# Patient Record
Sex: Female | Born: 1955 | Race: White | Hispanic: No | Smoking: Former smoker
Health system: Southern US, Community
[De-identification: ages and names within clinical notes are randomized; demographics above are authoritative.]

## PROBLEM LIST (undated history)

## (undated) DIAGNOSIS — R569 Unspecified convulsions: Secondary | ICD-10-CM

## (undated) DIAGNOSIS — L0291 Cutaneous abscess, unspecified: Secondary | ICD-10-CM

## (undated) DIAGNOSIS — IMO0002 Reserved for concepts with insufficient information to code with codable children: Secondary | ICD-10-CM

## (undated) DIAGNOSIS — Z8719 Personal history of other diseases of the digestive system: Secondary | ICD-10-CM

## (undated) DIAGNOSIS — N6320 Unspecified lump in the left breast, unspecified quadrant: Secondary | ICD-10-CM

## (undated) DIAGNOSIS — I10 Essential (primary) hypertension: Secondary | ICD-10-CM

## (undated) DIAGNOSIS — J449 Chronic obstructive pulmonary disease, unspecified: Secondary | ICD-10-CM

## (undated) DIAGNOSIS — G039 Meningitis, unspecified: Secondary | ICD-10-CM

## (undated) DIAGNOSIS — F99 Mental disorder, not otherwise specified: Secondary | ICD-10-CM

## (undated) DIAGNOSIS — T1491XA Suicide attempt, initial encounter: Secondary | ICD-10-CM

## (undated) DIAGNOSIS — Z9889 Other specified postprocedural states: Secondary | ICD-10-CM

## (undated) DIAGNOSIS — F329 Major depressive disorder, single episode, unspecified: Secondary | ICD-10-CM

## (undated) DIAGNOSIS — F1011 Alcohol abuse, in remission: Secondary | ICD-10-CM

## (undated) DIAGNOSIS — F1411 Cocaine abuse, in remission: Secondary | ICD-10-CM

## (undated) DIAGNOSIS — R092 Respiratory arrest: Secondary | ICD-10-CM

## (undated) DIAGNOSIS — G40909 Epilepsy, unspecified, not intractable, without status epilepticus: Secondary | ICD-10-CM

## (undated) DIAGNOSIS — Z8639 Personal history of other endocrine, nutritional and metabolic disease: Secondary | ICD-10-CM

## (undated) DIAGNOSIS — Q2730 Arteriovenous malformation, site unspecified: Secondary | ICD-10-CM

## (undated) DIAGNOSIS — C3411 Malignant neoplasm of upper lobe, right bronchus or lung: Principal | ICD-10-CM

## (undated) HISTORY — DX: Arteriovenous malformation, site unspecified: Q27.30

## (undated) HISTORY — PX: BREAST SURGERY: SHX581

## (undated) HISTORY — PX: CRANIOTOMY: SHX93

## (undated) HISTORY — DX: Respiratory arrest: R09.2

## (undated) HISTORY — DX: Cocaine abuse, in remission: F14.11

## (undated) HISTORY — DX: Chronic obstructive pulmonary disease, unspecified: J44.9

## (undated) HISTORY — DX: Cutaneous abscess, unspecified: L02.91

## (undated) HISTORY — DX: Suicide attempt, initial encounter: T14.91XA

## (undated) HISTORY — DX: Alcohol abuse, in remission: F10.11

## (undated) HISTORY — DX: Mental disorder, not otherwise specified: F99

## (undated) HISTORY — DX: Personal history of other diseases of the digestive system: Z87.19

## (undated) HISTORY — PX: BRAIN SURGERY: SHX531

## (undated) HISTORY — DX: Personal history of other endocrine, nutritional and metabolic disease: Z86.39

## (undated) HISTORY — PX: OTHER SURGICAL HISTORY: SHX169

## (undated) HISTORY — PX: BACK SURGERY: SHX140

## (undated) HISTORY — DX: Major depressive disorder, single episode, unspecified: F32.9

---

## 1980-04-29 HISTORY — PX: BREAST EXCISIONAL BIOPSY: SUR124

## 1997-08-07 ENCOUNTER — Emergency Department (HOSPITAL_COMMUNITY): Admission: EM | Admit: 1997-08-07 | Discharge: 1997-08-07 | Payer: Self-pay | Admitting: Emergency Medicine

## 1998-10-28 ENCOUNTER — Emergency Department (HOSPITAL_COMMUNITY): Admission: EM | Admit: 1998-10-28 | Discharge: 1998-10-28 | Payer: Self-pay | Admitting: *Deleted

## 1999-12-25 ENCOUNTER — Emergency Department (HOSPITAL_COMMUNITY): Admission: EM | Admit: 1999-12-25 | Discharge: 1999-12-25 | Payer: Self-pay | Admitting: Emergency Medicine

## 2000-01-08 ENCOUNTER — Encounter: Payer: Self-pay | Admitting: Surgery

## 2000-01-10 ENCOUNTER — Ambulatory Visit (HOSPITAL_COMMUNITY): Admission: RE | Admit: 2000-01-10 | Discharge: 2000-01-10 | Payer: Self-pay | Admitting: Surgery

## 2002-03-08 ENCOUNTER — Emergency Department (HOSPITAL_COMMUNITY): Admission: EM | Admit: 2002-03-08 | Discharge: 2002-03-09 | Payer: Self-pay | Admitting: *Deleted

## 2002-05-27 ENCOUNTER — Encounter: Payer: Self-pay | Admitting: Internal Medicine

## 2002-05-27 ENCOUNTER — Ambulatory Visit (HOSPITAL_COMMUNITY): Admission: RE | Admit: 2002-05-27 | Discharge: 2002-05-27 | Payer: Self-pay | Admitting: Internal Medicine

## 2002-07-31 ENCOUNTER — Emergency Department (HOSPITAL_COMMUNITY): Admission: EM | Admit: 2002-07-31 | Discharge: 2002-07-31 | Payer: Self-pay | Admitting: Emergency Medicine

## 2002-07-31 ENCOUNTER — Encounter: Payer: Self-pay | Admitting: *Deleted

## 2002-09-04 ENCOUNTER — Emergency Department (HOSPITAL_COMMUNITY): Admission: EM | Admit: 2002-09-04 | Discharge: 2002-09-04 | Payer: Self-pay | Admitting: Emergency Medicine

## 2002-09-05 ENCOUNTER — Emergency Department (HOSPITAL_COMMUNITY): Admission: EM | Admit: 2002-09-05 | Discharge: 2002-09-05 | Payer: Self-pay | Admitting: Emergency Medicine
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
## 2003-07-31 ENCOUNTER — Inpatient Hospital Stay (HOSPITAL_COMMUNITY): Admission: AD | Admit: 2003-07-31 | Discharge: 2003-08-02 | Payer: Self-pay | Admitting: Emergency Medicine

## 2004-06-04 ENCOUNTER — Ambulatory Visit: Payer: Self-pay | Admitting: Family Medicine

## 2004-06-14 ENCOUNTER — Ambulatory Visit: Payer: Self-pay | Admitting: Family Medicine

## 2004-09-03 ENCOUNTER — Ambulatory Visit: Payer: Self-pay | Admitting: Family Medicine

## 2004-12-18 ENCOUNTER — Inpatient Hospital Stay (HOSPITAL_COMMUNITY): Admission: EM | Admit: 2004-12-18 | Discharge: 2004-12-20 | Payer: Self-pay | Admitting: Internal Medicine

## 2004-12-21 ENCOUNTER — Emergency Department (HOSPITAL_COMMUNITY): Admission: EM | Admit: 2004-12-21 | Discharge: 2004-12-21 | Payer: Self-pay | Admitting: Emergency Medicine

## 2005-01-08 ENCOUNTER — Emergency Department (HOSPITAL_COMMUNITY): Admission: EM | Admit: 2005-01-08 | Discharge: 2005-01-08 | Payer: Self-pay | Admitting: Emergency Medicine

## 2005-05-07 ENCOUNTER — Inpatient Hospital Stay (HOSPITAL_COMMUNITY): Admission: EM | Admit: 2005-05-07 | Discharge: 2005-05-09 | Payer: Self-pay | Admitting: Emergency Medicine

## 2005-06-11 ENCOUNTER — Ambulatory Visit: Payer: Self-pay | Admitting: *Deleted

## 2005-06-11 ENCOUNTER — Ambulatory Visit: Payer: Self-pay | Admitting: Family Medicine

## 2005-07-02 ENCOUNTER — Ambulatory Visit: Payer: Self-pay | Admitting: Family Medicine

## 2005-07-26 ENCOUNTER — Emergency Department (HOSPITAL_COMMUNITY): Admission: EM | Admit: 2005-07-26 | Discharge: 2005-07-26 | Payer: Self-pay | Admitting: Emergency Medicine

## 2005-07-28 ENCOUNTER — Encounter (INDEPENDENT_AMBULATORY_CARE_PROVIDER_SITE_OTHER): Payer: Self-pay | Admitting: *Deleted

## 2005-07-28 ENCOUNTER — Inpatient Hospital Stay (HOSPITAL_COMMUNITY): Admission: EM | Admit: 2005-07-28 | Discharge: 2005-12-04 | Payer: Self-pay | Admitting: Pediatrics

## 2005-07-28 ENCOUNTER — Ambulatory Visit: Payer: Self-pay | Admitting: Infectious Diseases

## 2005-07-28 ENCOUNTER — Ambulatory Visit: Payer: Self-pay | Admitting: Pulmonary Disease

## 2005-07-28 DIAGNOSIS — L0291 Cutaneous abscess, unspecified: Secondary | ICD-10-CM

## 2005-07-28 HISTORY — DX: Cutaneous abscess, unspecified: L02.91

## 2005-09-19 ENCOUNTER — Ambulatory Visit: Payer: Self-pay | Admitting: Internal Medicine
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
## 2005-11-22 ENCOUNTER — Ambulatory Visit: Payer: Self-pay | Admitting: Physical Medicine & Rehabilitation

## 2006-01-06 ENCOUNTER — Encounter: Admission: RE | Admit: 2006-01-06 | Discharge: 2006-04-06 | Payer: Self-pay | Admitting: Neurosurgery

## 2006-01-31 IMAGING — CR DG CHEST 1V PORT
1 series · 1 of 1 positions shown · non-contrast
Comparison: none

CLINICAL DATA: Seizure, altered level of consciousness. 
 PORTABLE CHEST ? 05/07/05 ([DATE] HOURS):
 No comparison. 
 The lungs are hyperinflated with changes of COPD and the lungs are clear.   There are old rib fractures bilaterally.

[view not recorded]
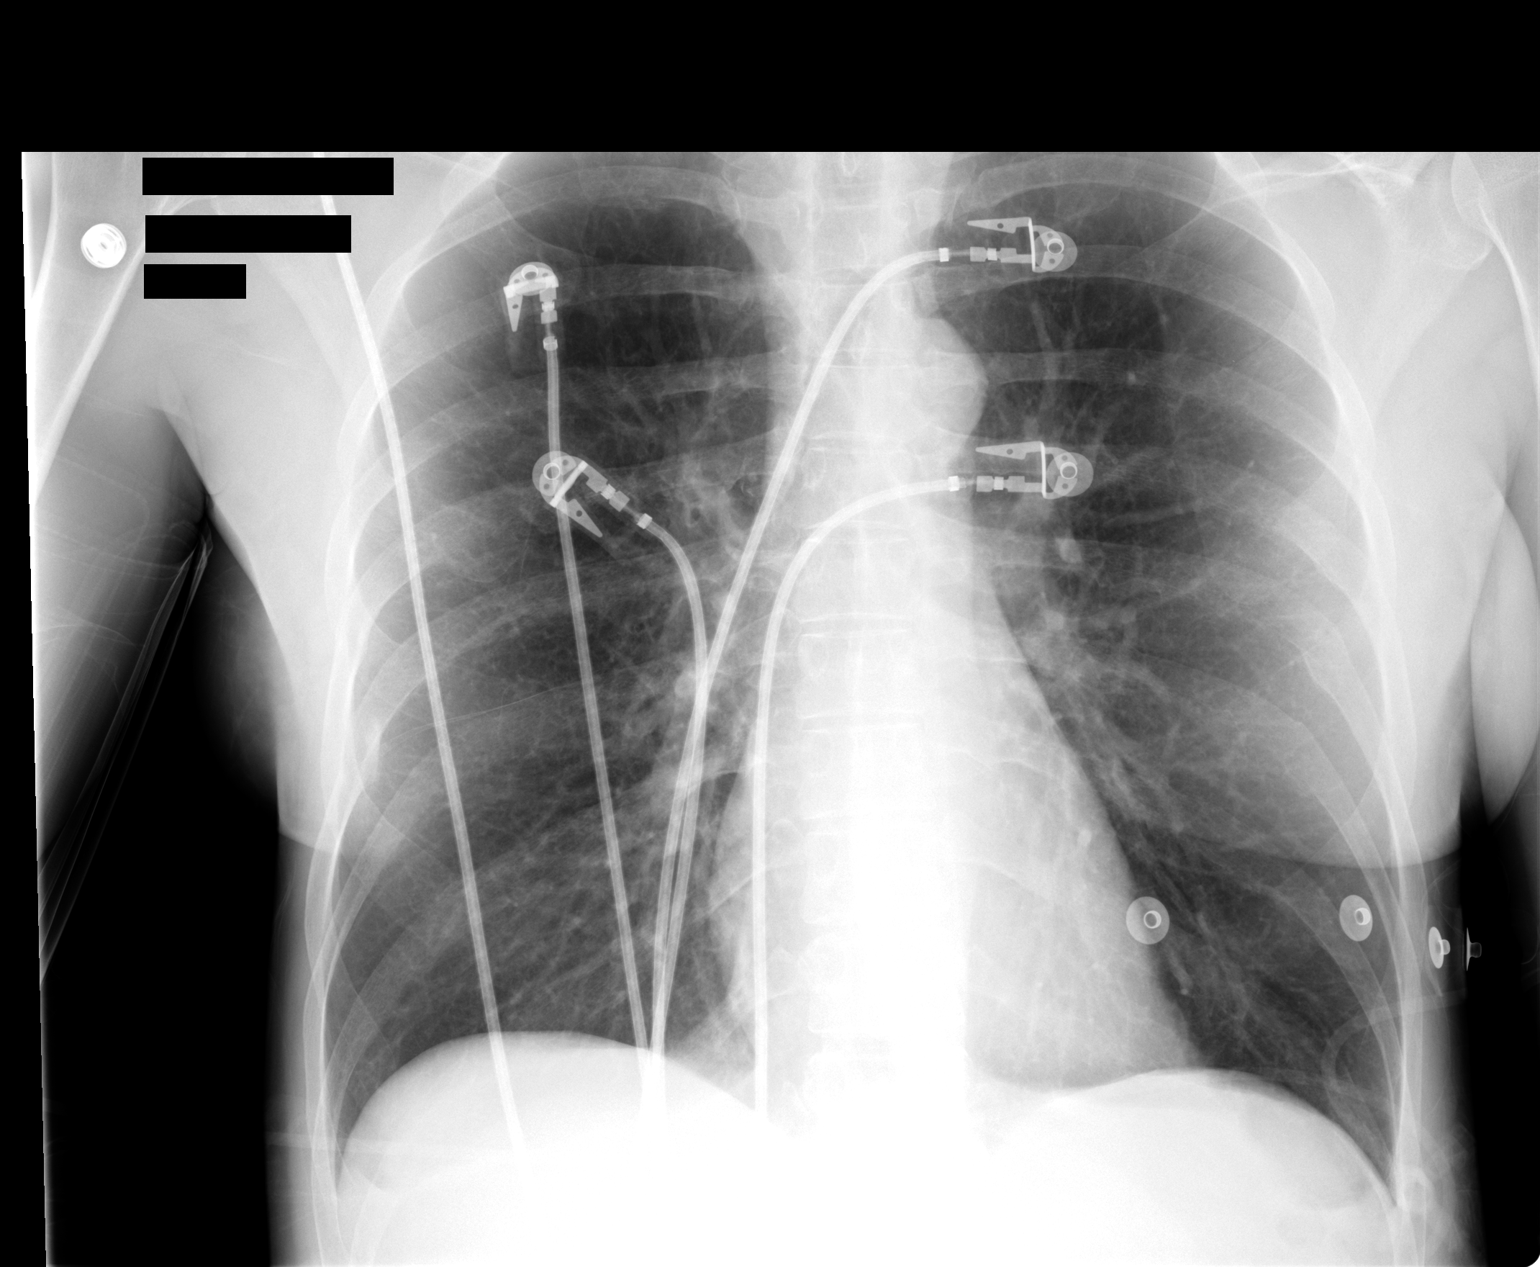

[1 of 1 positions shown; findings below may reference images not displayed]

IMPRESSION: COPD. No active disease.

## 2006-01-31 IMAGING — CT CT HEAD W/O CM
1 series · 15 of 30 positions shown, 19 images · IV contrast (agent unspecified)
Comparison: CT 07/31/02.

CLINICAL DATA: Seizure, history of aneurysm. 
HEAD CT WITHOUT CONTRAST:
TECHNIQUE: Contiguous axial images were obtained from the base of the skull through the vertex according to standard protocol without contrast.

[Series 2: brain · axial · 0.47mm/px · z∈[+138,+269]mm · 15 of 42 slices shown, 19 images]
[im 2/42  brain]
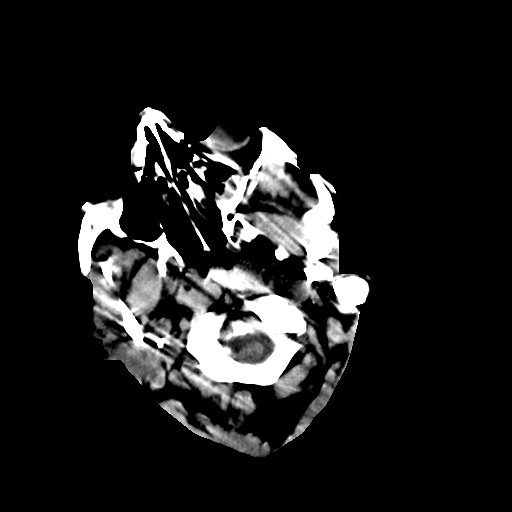
[im 2/42  bone]
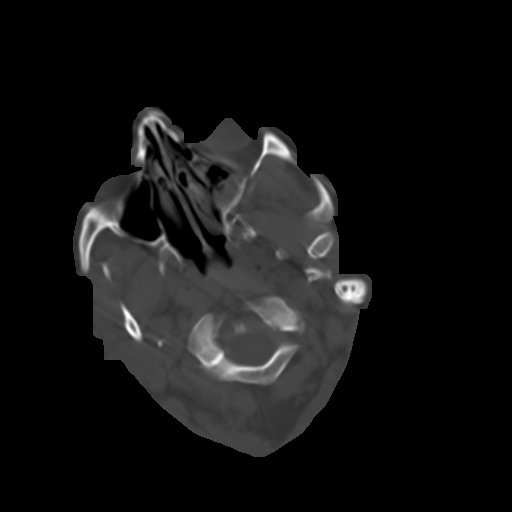
[im 5/42  brain]
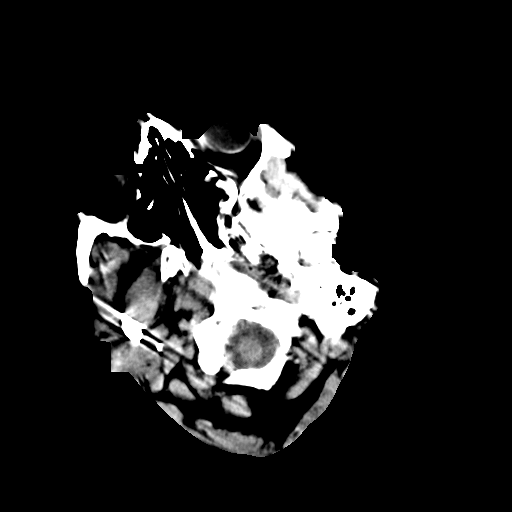
[im 8/42  brain]
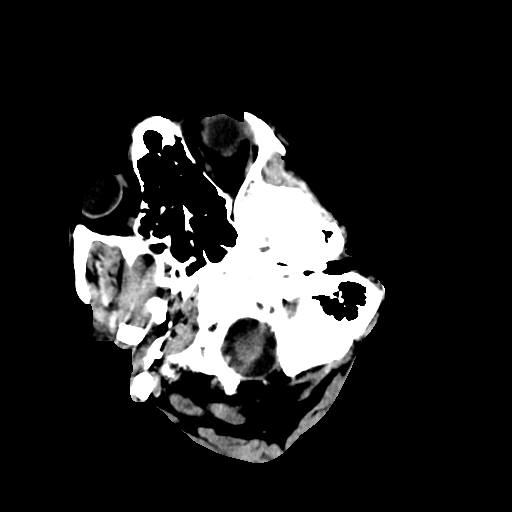
[im 10/42  brain]
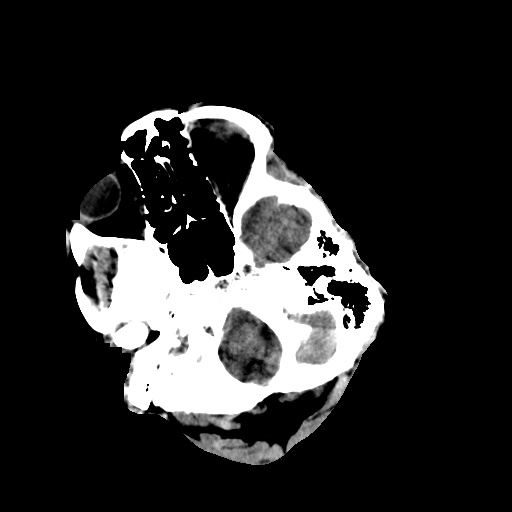
[im 13/42  brain]
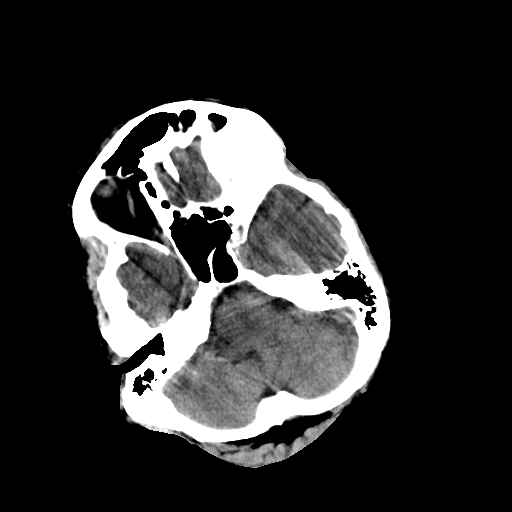
[im 13/42  bone]
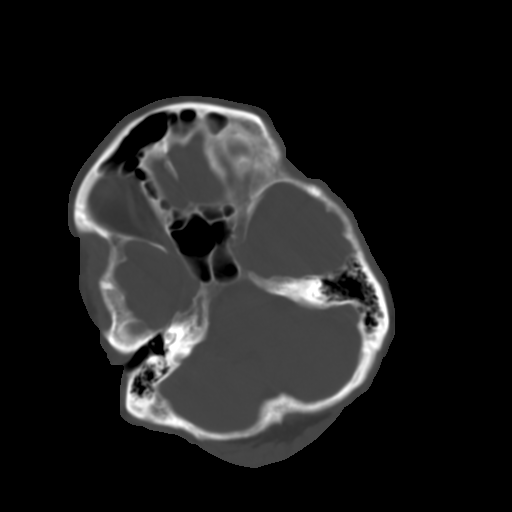
[im 16/42  brain]
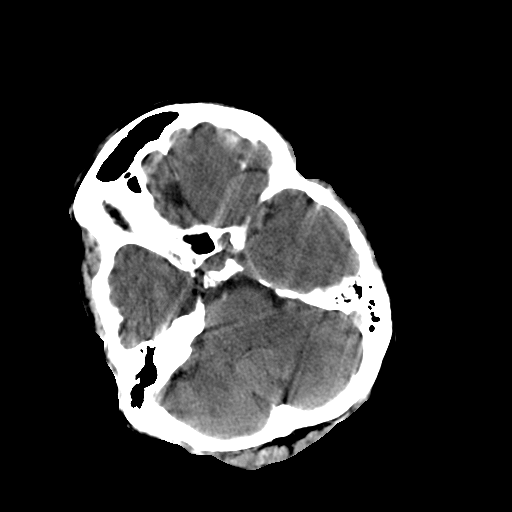
[im 19/42  brain]
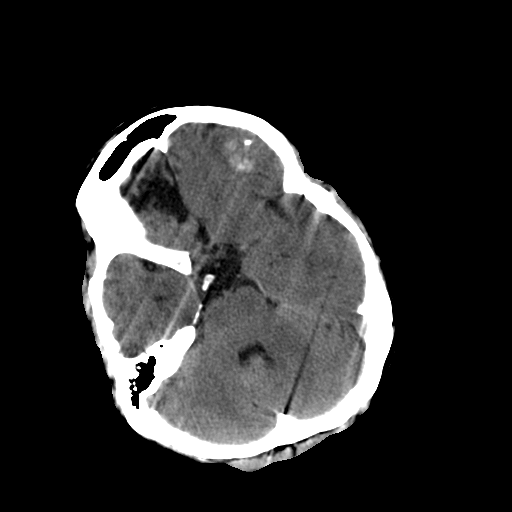
[im 22/42  brain]
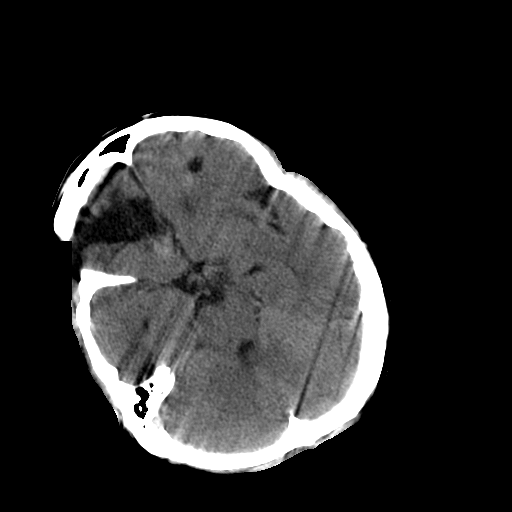
[im 23/42  brain]
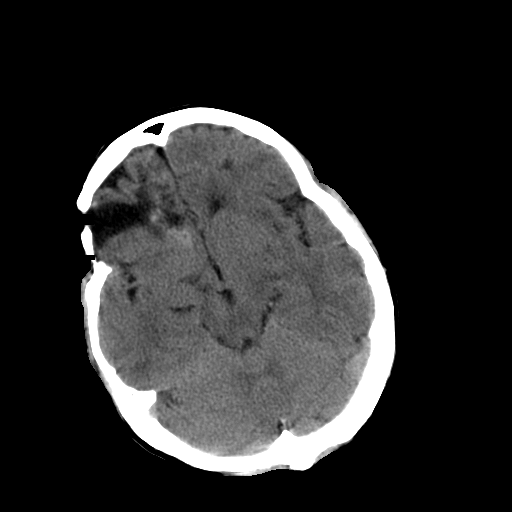
[im 23/42  bone]
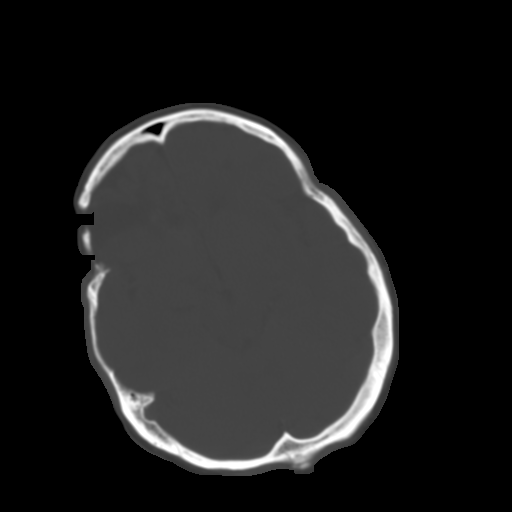
[im 26/42  brain]
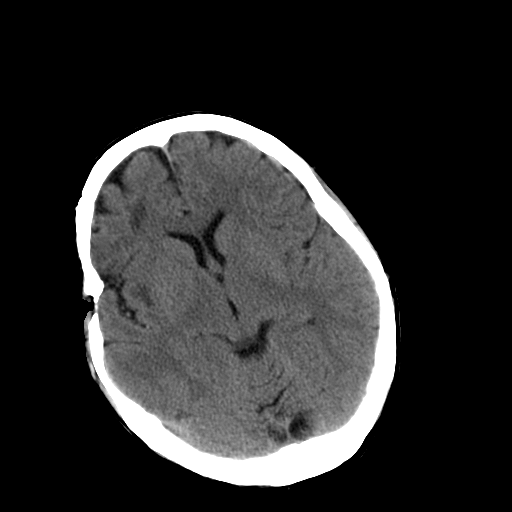
[im 29/42  brain]
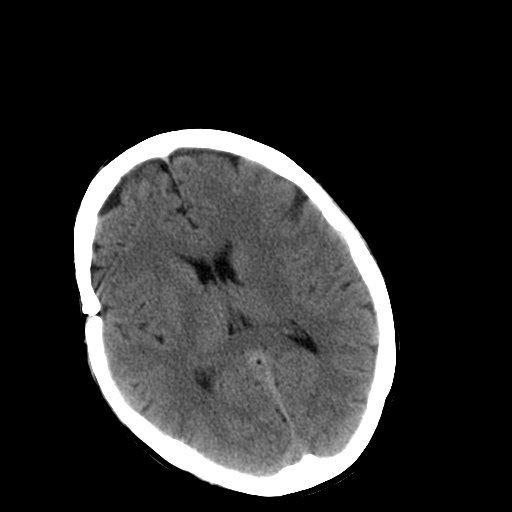
[im 32/42  brain]
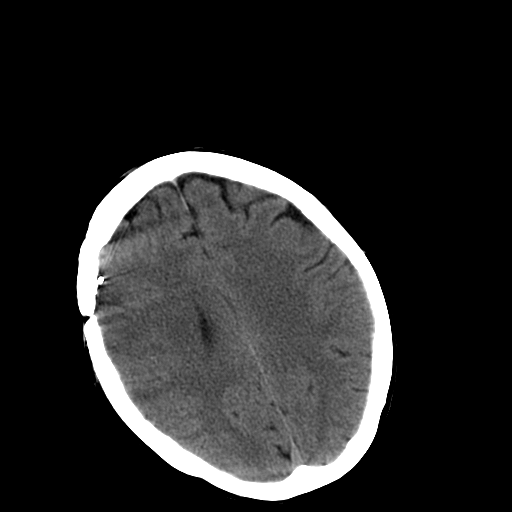
[im 34/42  brain]
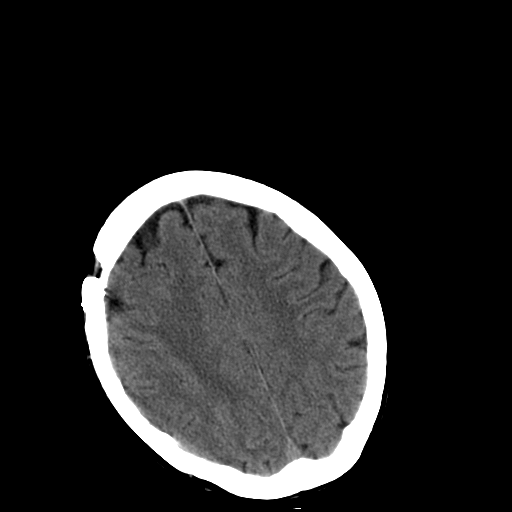
[im 34/42  bone]
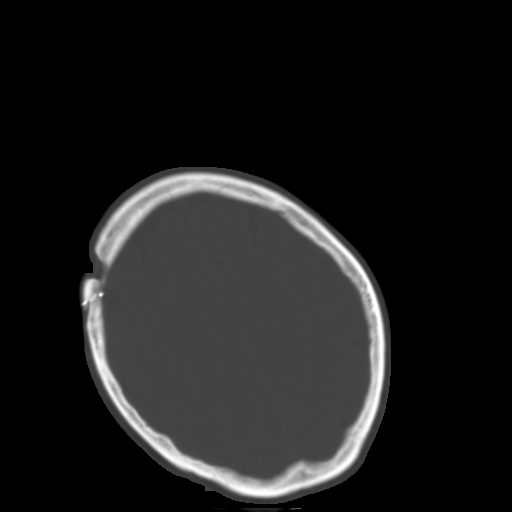
[im 37/42  brain]
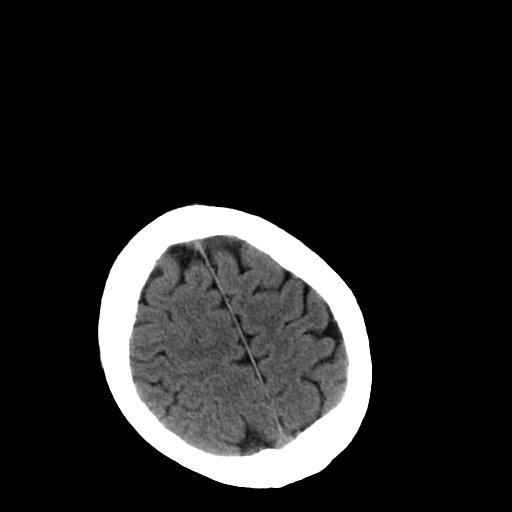
[im 40/42  brain]
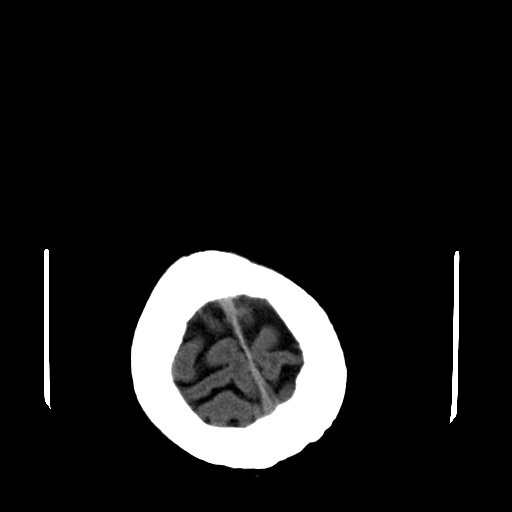

[15 of 30 positions shown; findings below may reference images not displayed]

There has been right frontal craniotomy.  There is encephalomalacia in the right lower frontal lobe. There is some ill-defined increased density around the borders of the encephalomalacia which is felt to be due to dystrophic calcification and was present on the prior study.   It may be slightly more prominent.  
There is milder encephalomalacia in the left frontal lobe.  There is some surrounding ill-defined high density surrounding the encephalomalacia. Some of this is calcified.  There is also some ill defined area of inceased density that  is more prominent than on the prior study is probably calcium but could be blood.  It measures approximately 60 Hounsfield units and has changed in the interval.
There is no subarachnoid hemorrhage.   The ventricles are not enlarged. There is no midline shift.   No acute infarct is identified.
IMPRESSION: Encephalomalacia in the right frontal lobe with some dystrophic calcification which may be slightly more prominent than the prior study. 
A smaller area of encephalomalacia in the left frontal lobe with some increase in density around this cystic area. This is most likely dystrophic calcification with acute hemorrhage not completely excluded.  Follow-up scans may be helpful.

## 2006-04-07 ENCOUNTER — Encounter: Admission: RE | Admit: 2006-04-07 | Discharge: 2006-07-06 | Payer: Self-pay | Admitting: Neurosurgery

## 2006-04-21 IMAGING — CT CT CHEST W/ CM
3 series · 17 of 29 positions shown, 19 images · IV contrast (omnipaque)
Comparison: Chest x-ray on the same date and chest x-ray dated 05/07/05.

CLINICAL DATA: Hemoptysis and shortness of breath with recent weight loss.  Chest x-rays have demonstrated a cavitary process in the right lung representing either pneumonia or tumor.  Further characterization is performed with chest CT.
CHEST CT WITH CONTRAST ? 07/26/05:
TECHNIQUE: Multidetector CT imaging of the chest was performed following the standard protocol during bolus administration of intravenous contrast.   
Contrast:  100 cc Omnipaque 300 IV.

[Series 2: routine chest · axial · 0.68mm/px · z∈[-319,-84]mm · 6 of 71 slices shown, 8 images]
[im 12/71  mediastinal]
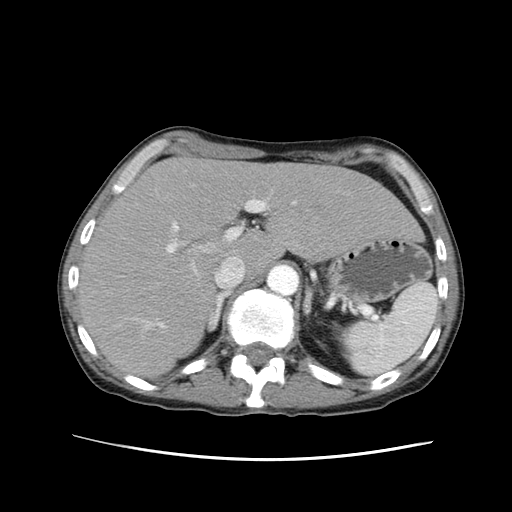
[im 12/71  lung]
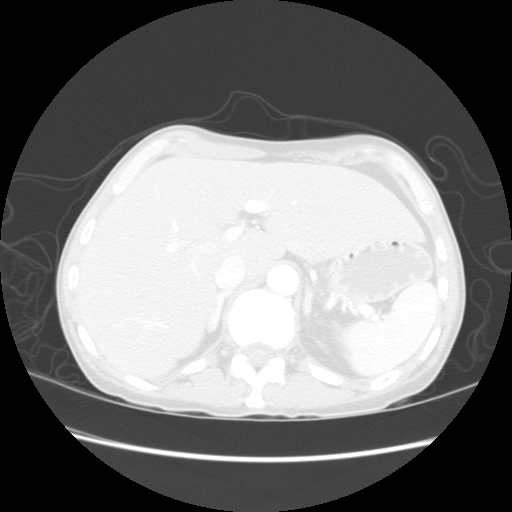
[im 24/71  lung]
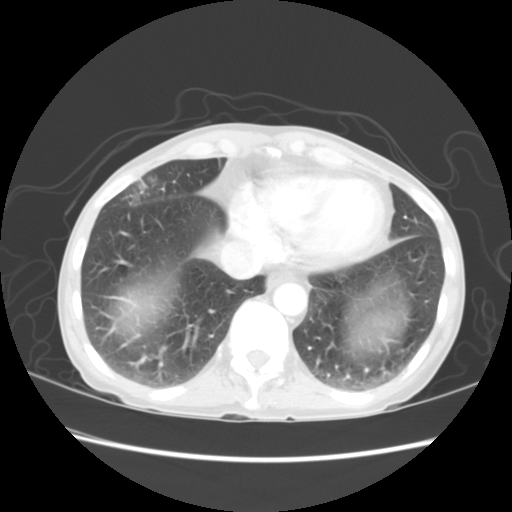
[im 34/71  lung]
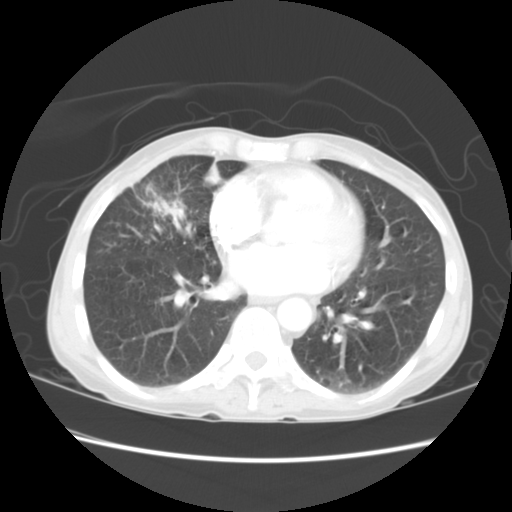
[im 36/71  lung]
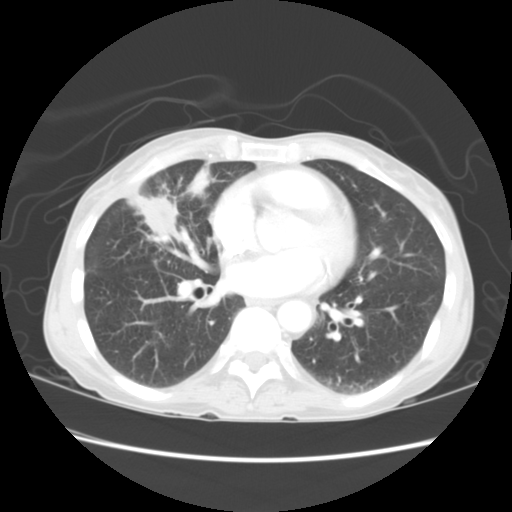
[im 47/71  mediastinal]
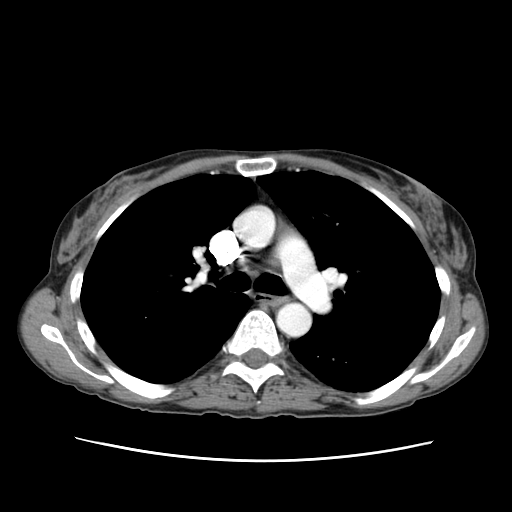
[im 47/71  lung]
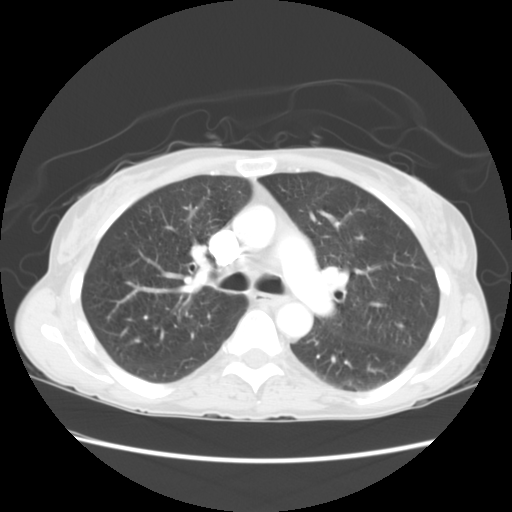
[im 59/71  lung]
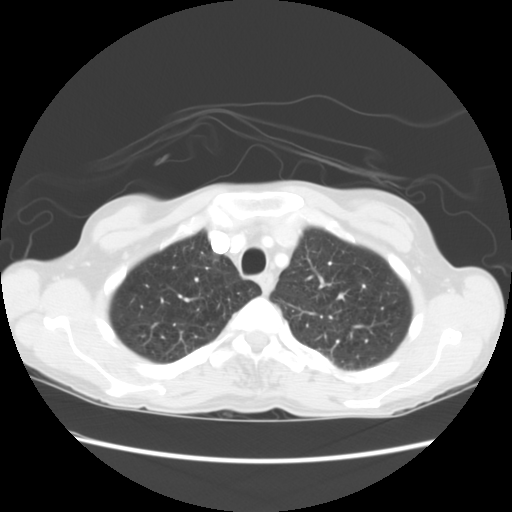

[Series 104: reformatted · sagittal · 0.55mm/px · 8 of 105 slices shown (1 of 2)]
[im 10/105  lung]
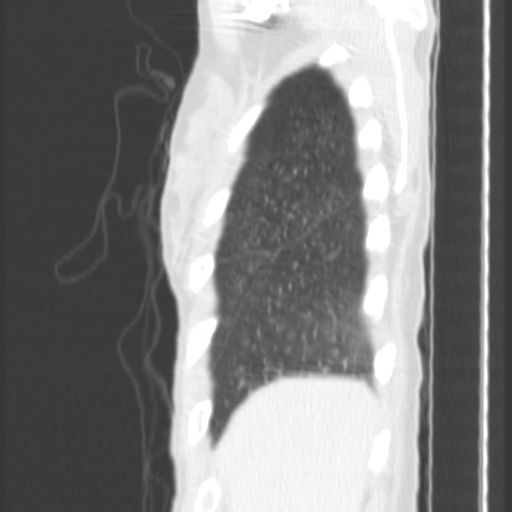
[im 19/105  lung]
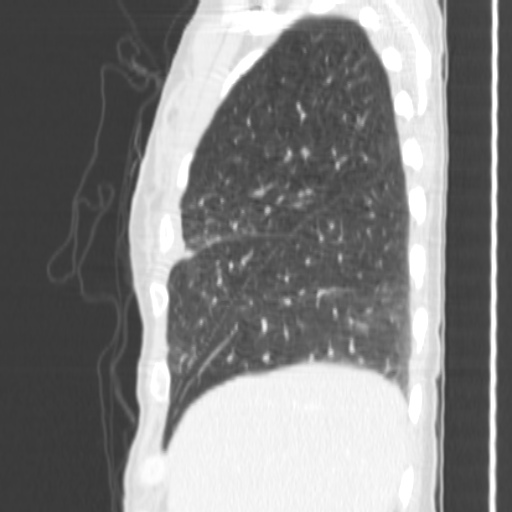
[im 38/105  lung]
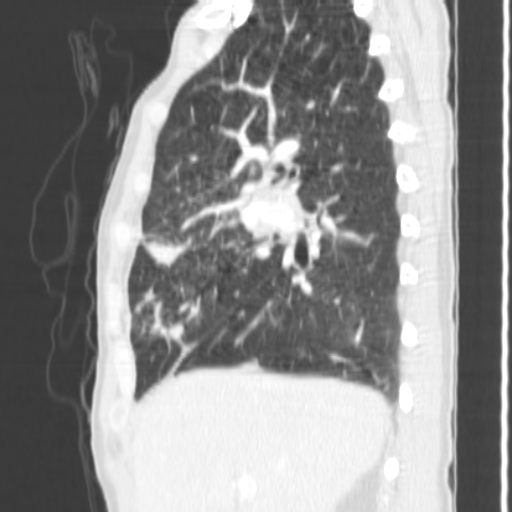
[im 48/105  lung]
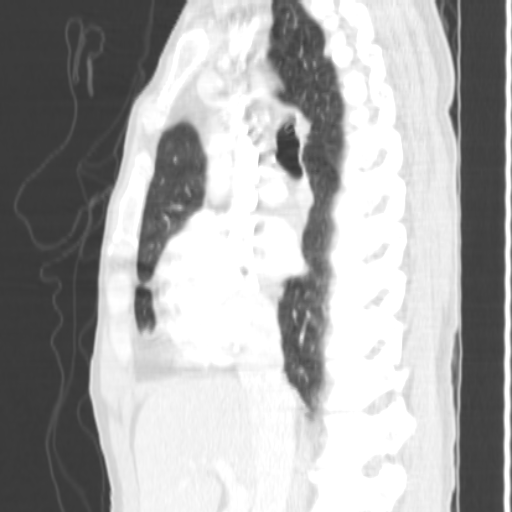
[im 57/105  lung]
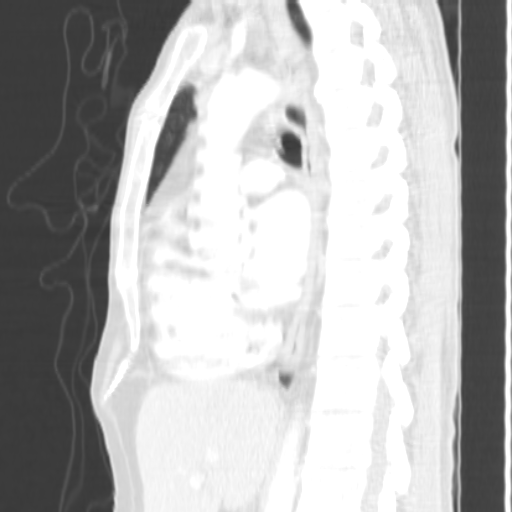
[im 67/105  lung]
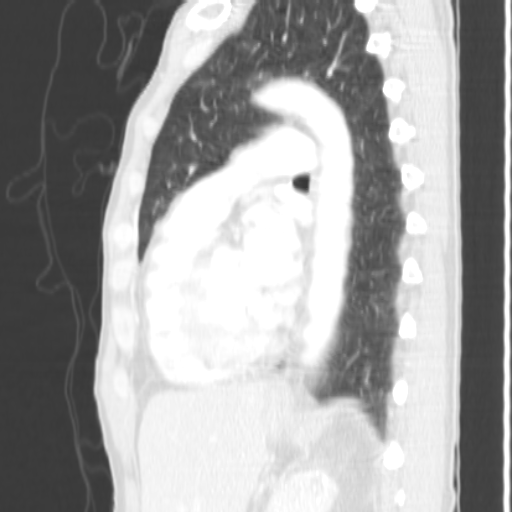
[im 86/105  lung]
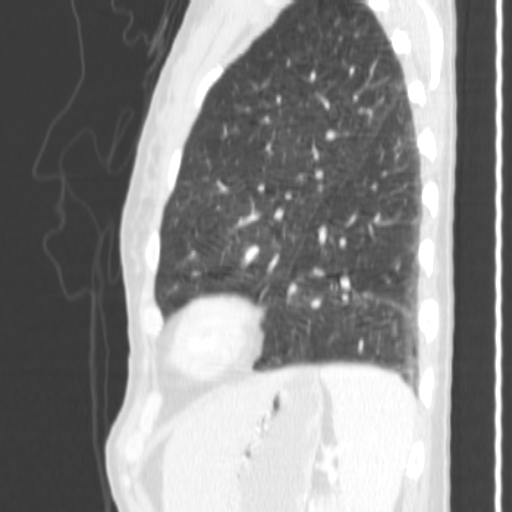
[im 95/105  lung]
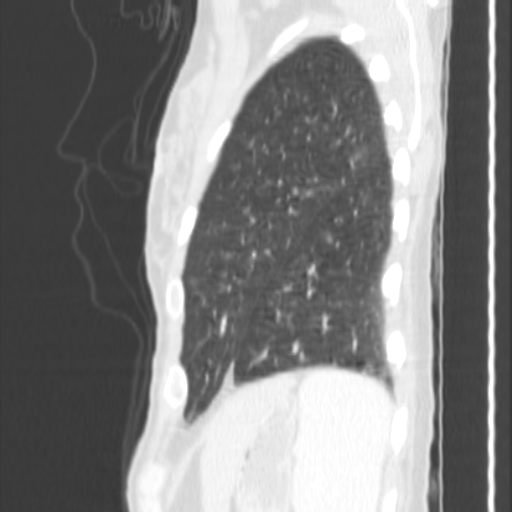

[Series 105: reformatted · coronal · 0.55mm/px · 3 of 77 slices shown (2 of 2)]
[im 10/77  lung]
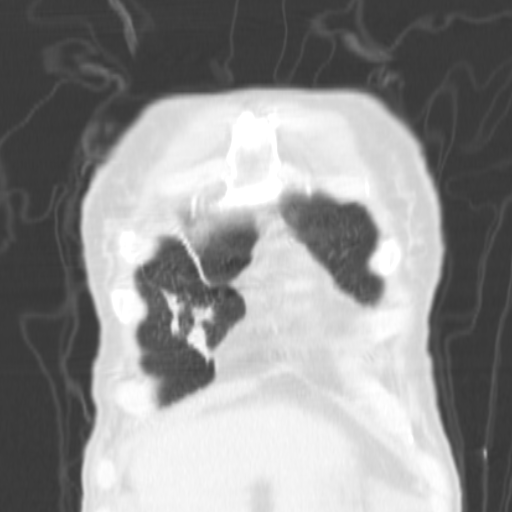
[im 20/77  lung]
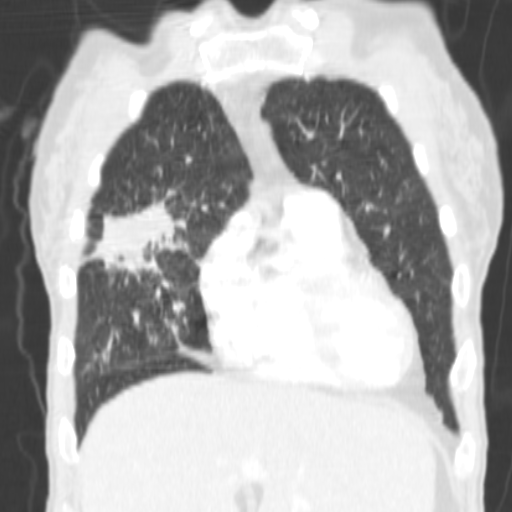
[im 29/77  lung]
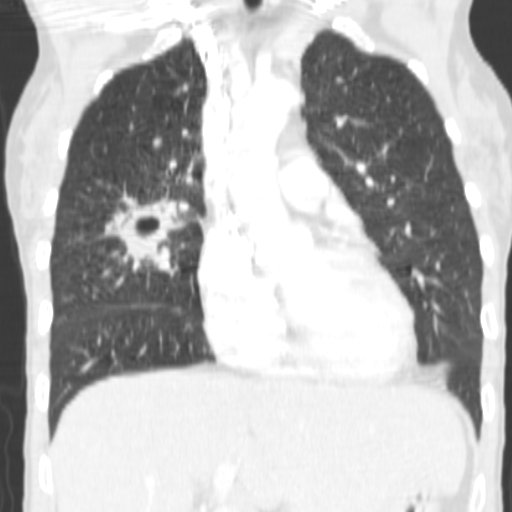

[17 of 29 positions shown; findings below may reference images not displayed]

FINDINGS: There is an area of consolidation in the right middle lobe with associated focal cavitation.  The focal air space within the cavitation measures approximately 1.5 cm in maximum diameter.  Some contiguous air space disease also extends into the medial and lower right middle lobe.  Although carcinoma is certainly in the differential, the fact that the chest x-ray was normal in this region in [DATE] does suggest that this is a cavitary pneumonia with potential necrosis and/or developing pulmonary abscess.  Follow-up CT is recommended after appropriate treatment as underlying tumor is still not completely excluded, especially if this is a potentially rapidly growing process.  There is no associated edema or pleural fluid.  No enlarged lymph nodes.  No other incidental findings.
IMPRESSION: Cavitary process of the right middle lobe.  This is still felt most likely to represent a cavitary pneumonia given that the chest x-ray on [DATE] was normal.  However, follow-up CT is recommended after appropriate antibiotic treatment as tumor is not completely excluded.  Bronchoscopy may also be of benefit in further evaluation.

## 2006-04-21 IMAGING — CR DG CHEST 2V
2 series · 2 of 2 positions shown · non-contrast
Comparison: none

CLINICAL DATA: Reported intermittent hemoptysis for one month.  Fever.  Smoker.
CHEST - 2 VIEWS:

[w chest pa]
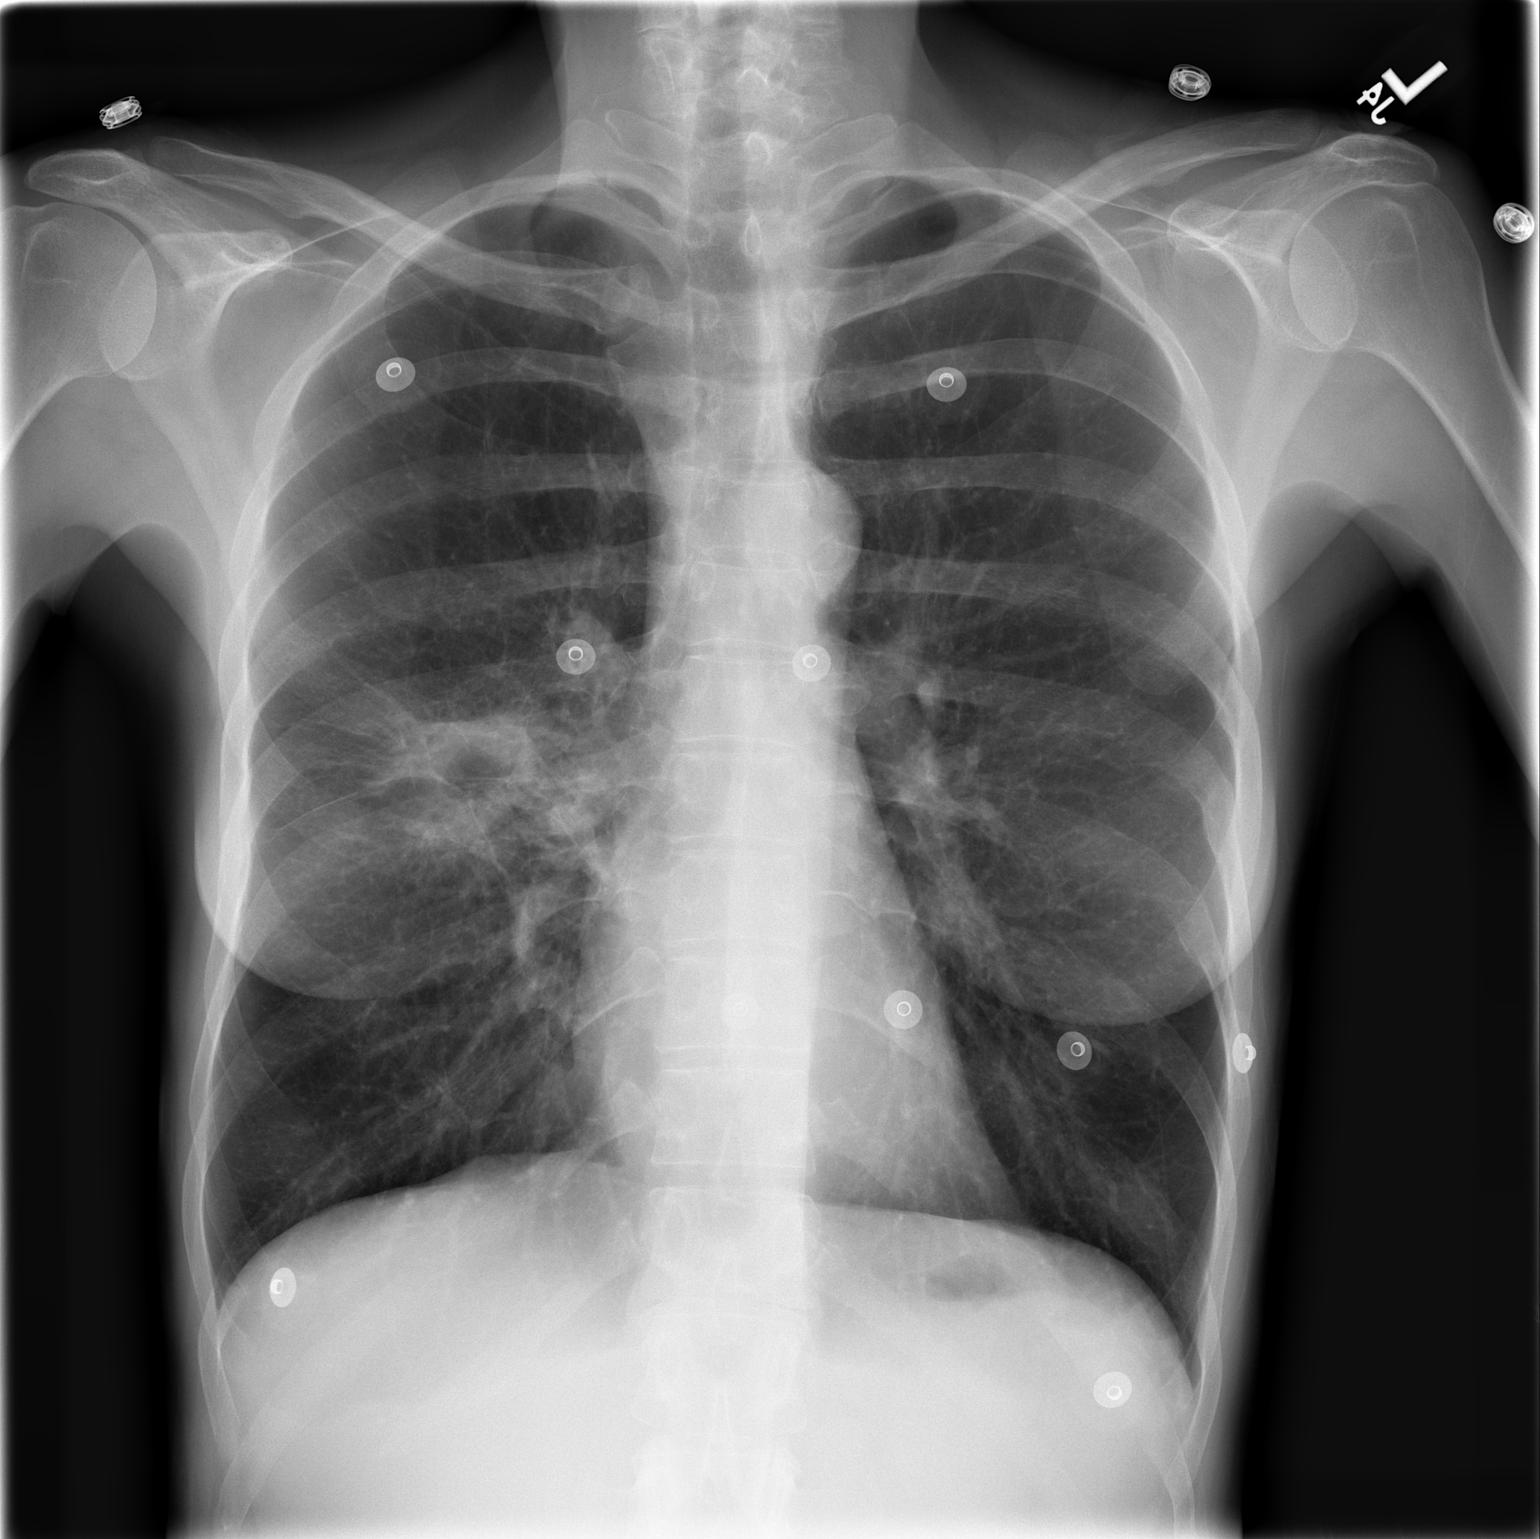

[w chest lat]
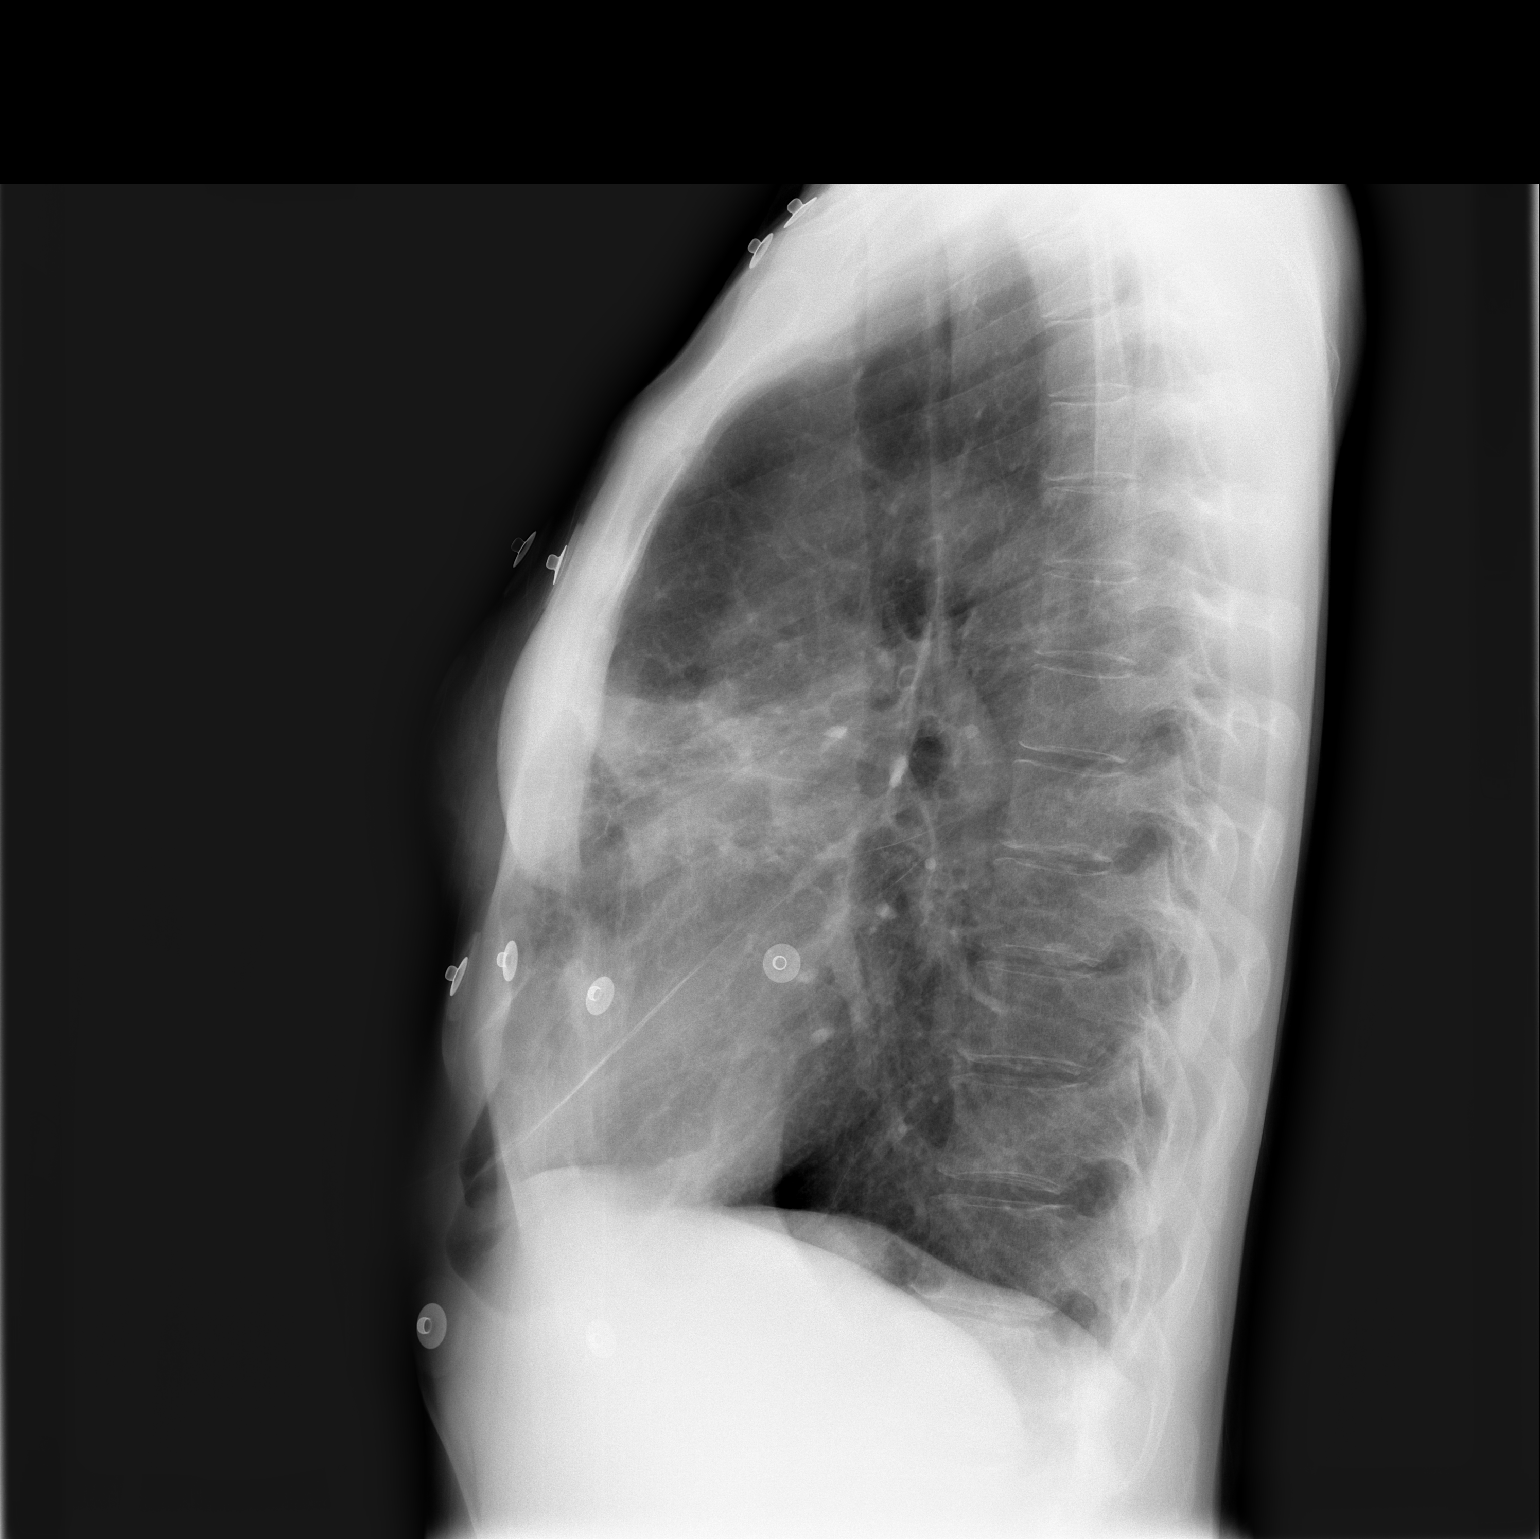

[2 of 2 positions shown; findings below may reference images not displayed]

FINDINGS: There is an irregular ill-defined infiltrative-like density in the anterior segment of the right upper lobe and superior aspect of the right middle lobe, consistent with pneumonia however, tumor cannot be excluded.  Therefore, recommend follow-up chest radiograph to assess for complete clearing.  COPD.  Normal cardiac size and shape.
IMPRESSION: Findings compatible with right middle and upper lobar pneumonia versus potentially tumor.  Recommend follow-up chest x-ray.

## 2006-04-23 IMAGING — CT CT ANGIO HEAD
3 of 7 series · 18 of 47 positions shown · IV contrast (omnipaque)
Comparison: 05/07/05 and 07/31/02.

CLINICAL DATA: Unresponsive.  Signs of increased intracranial pressure with bilateral third nerve palsies.  Previous subarachnoid hemorrhage.

CT ANGIOGRAPHY OF HEAD:
TECHNIQUE: Multidetector CT imaging of the brain was performed during bolus injection of intravenous contrast.  Multiplanar CT angiographic image reconstructions were generated to evaluate cerebral vasculature centered at the circle of Willis.  The patient was initially brought to the CT department and scanned as a non-contrast head.  Following detection of several abnormalities, the patient was returned to the emergency room, where they received a ventriculostomy.  The post infusion images were after placement of ventricular catheter.  The study is being dictated as pre and post contrast head together as one study for the purposes of reporting the CT angiographic findings.
Contrast:  120 ml Omnipaque 350.

[Series 5: recon 2: circle of willis · axial · 0.27mm/px · z∈[+61,+216]mm · 12 of 277 slices shown]
[im 15/277  brain]
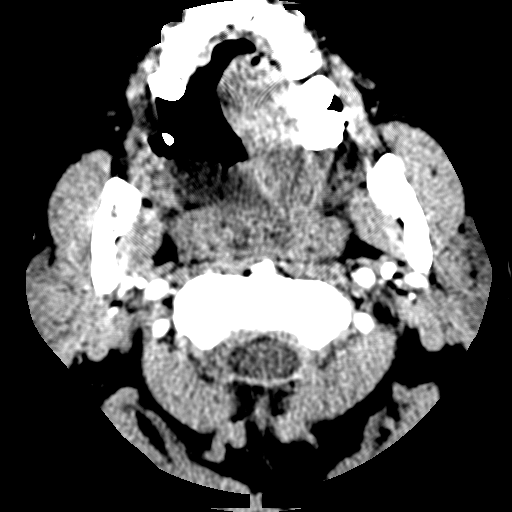
[im 44/277  bone]
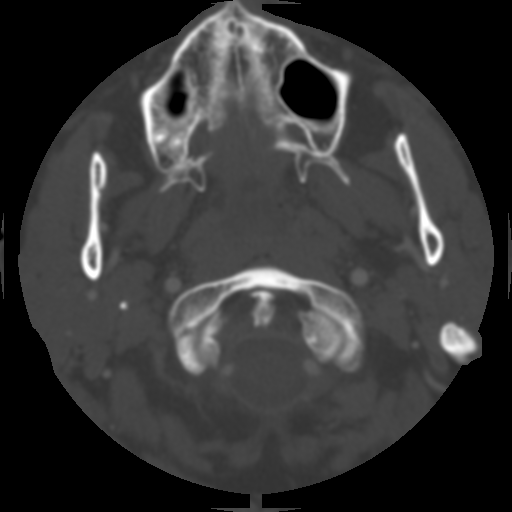
[im 59/277  brain]
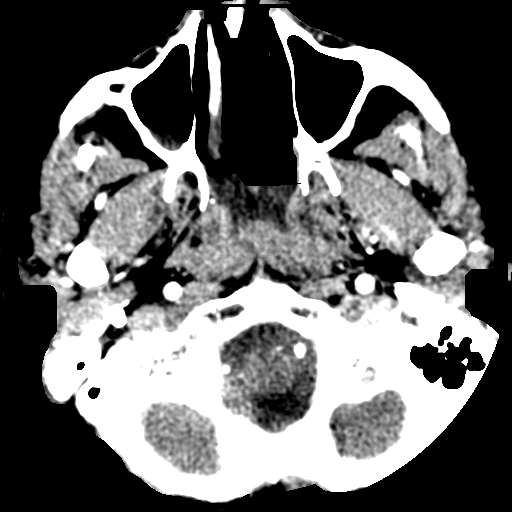
[im 88/277  bone]
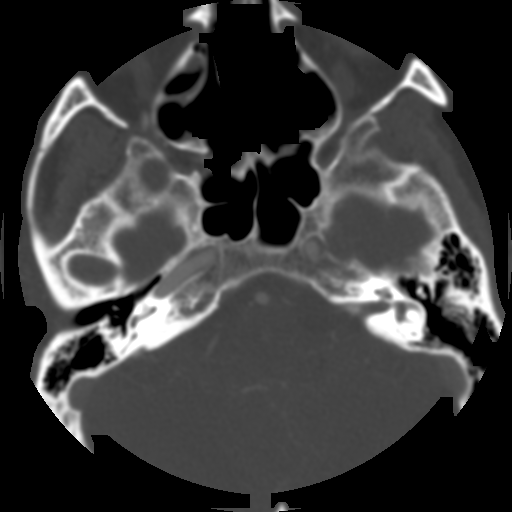
[im 102/277  brain]
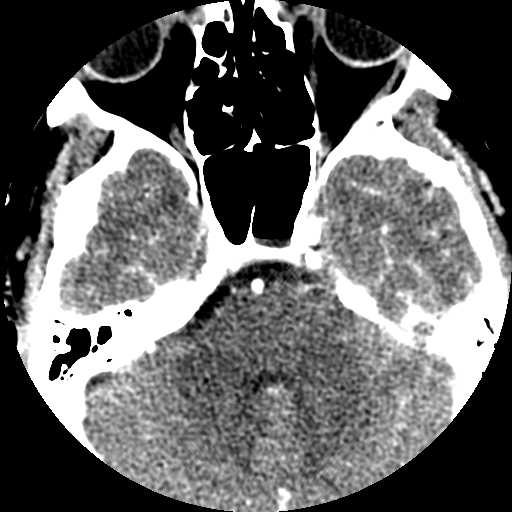
[im 131/277  bone]
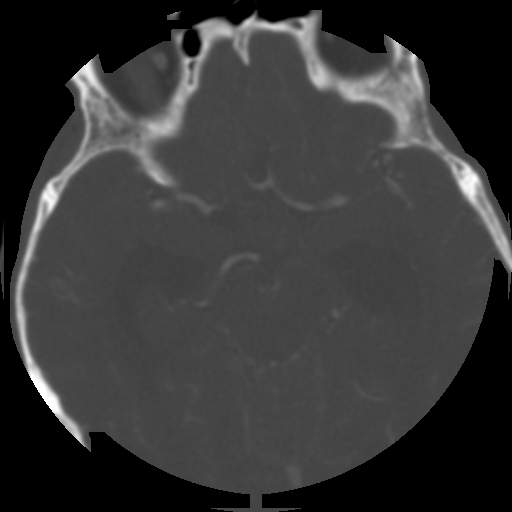
[im 146/277  brain]
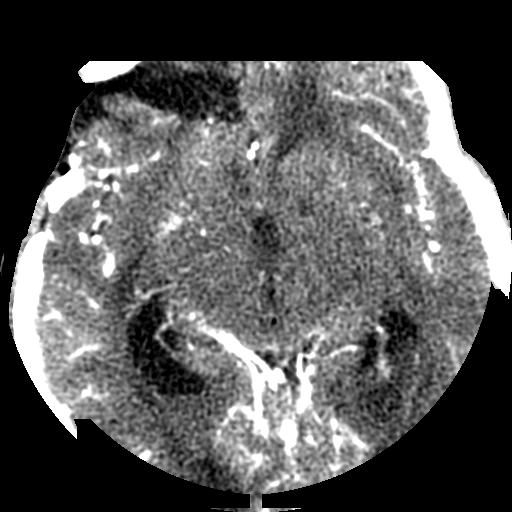
[im 175/277  bone]
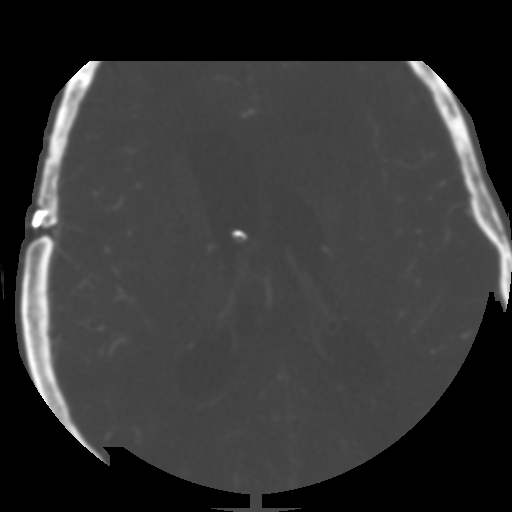
[im 189/277  brain]
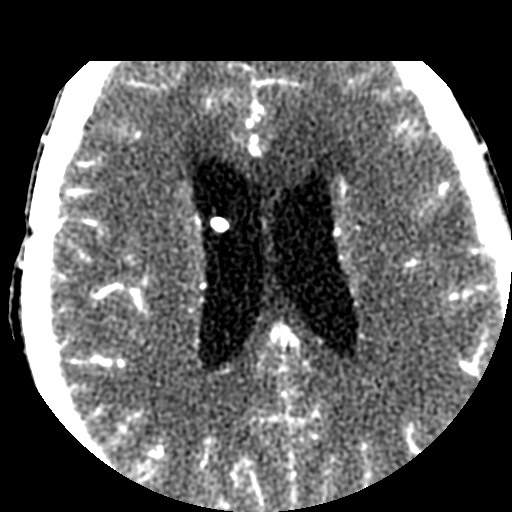
[im 218/277  bone]
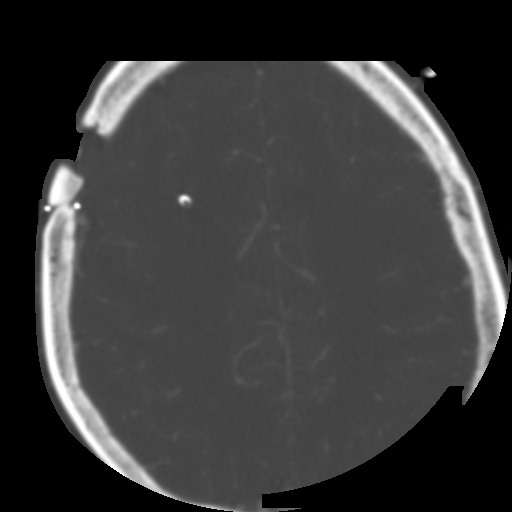
[im 233/277  brain]
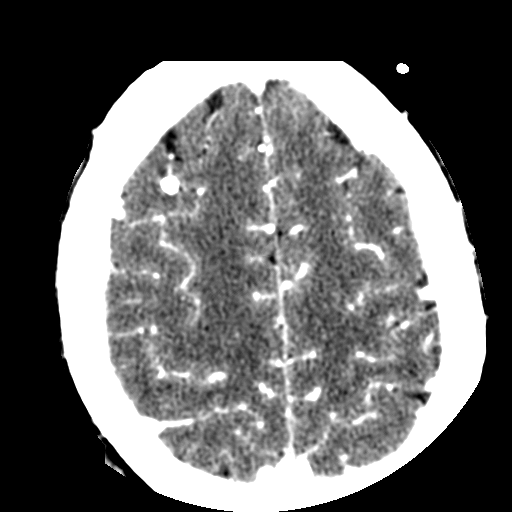
[im 262/277  bone]
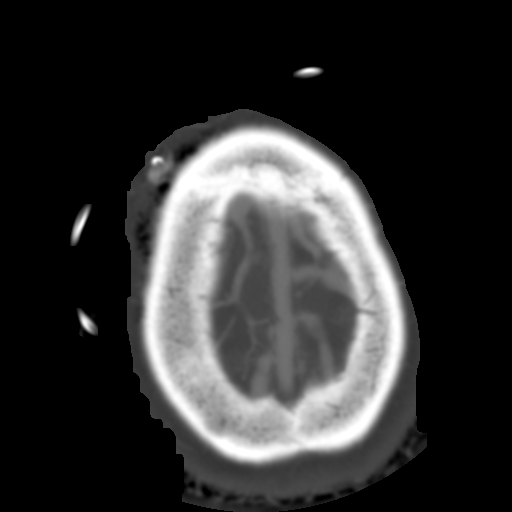

[Series 500: reformatted · coronal · 0.27mm/px · 3 of 35 slices shown (1 of 2)]
[im 12/35  brain]
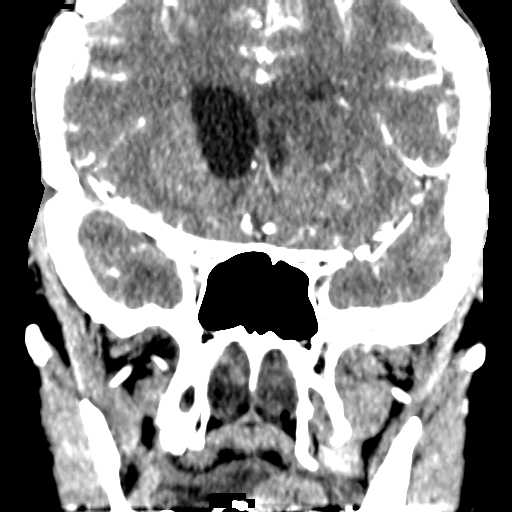
[im 16/35  brain]
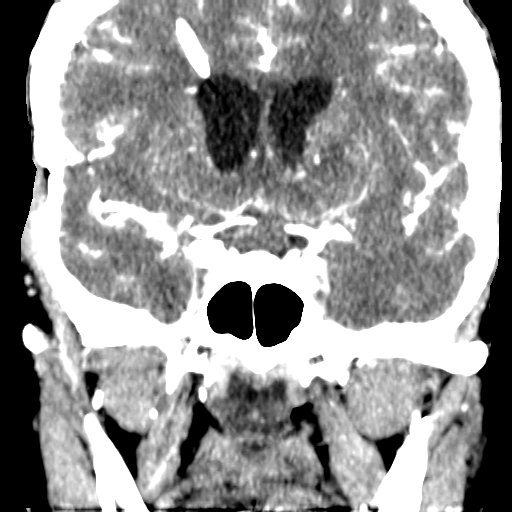
[im 19/35  brain]
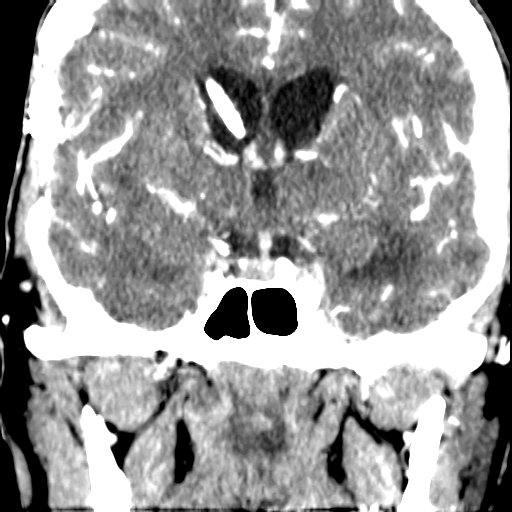

[Series 501: reformatted · sagittal · 0.27mm/px · 3 of 35 slices shown (2 of 2)]
[im 12/35  brain]
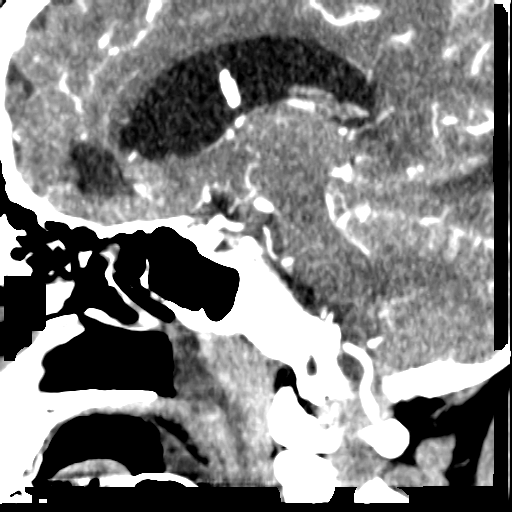
[im 18/35  brain]
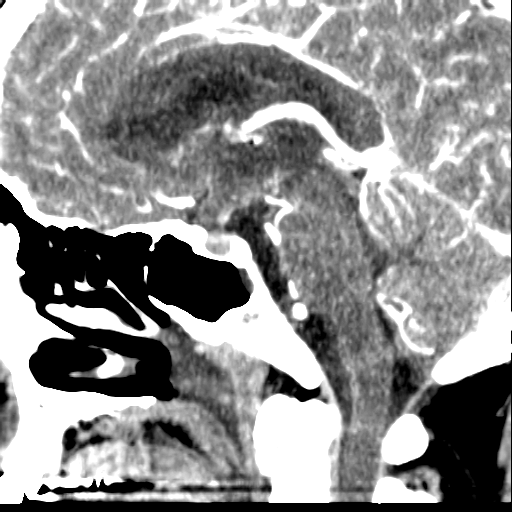
[im 23/35  brain]
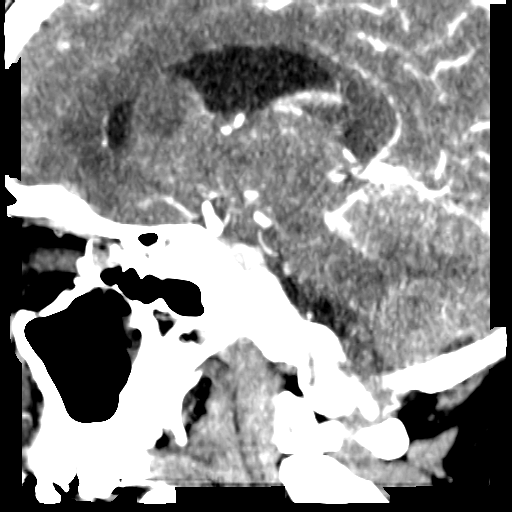

[18 of 47 positions shown; findings below may reference images not displayed]

FINDINGS: Noncontrast images of the head demonstrate previous right frontotemporal craniectomy.  There is encephalomalacia in the right frontal lobe.  There is dystrophic calcification in the left frontal region.
Marked hydrocephalus is present, affecting particularly the lateral and third ventricles.  The fourth ventricle is not particularly dilated.  Soft tissue density is seen in the suprasellar cistern.  
There is a roughly spherical 15 mm area of signal abnormality in the region of the left caudate.  This is associated with fluid layering within the occipital horns of the lateral ventricles, both of intermediate Hounsfield attenuation (20 to 30Hu).  
Post infusion, there is ring enhancement of a 16 x 17 mm suspected abscess involving the subependymal region of the left frontal horn near the caudate.  There is also enhancement of the ependymal lining of the ventricles, more on the left than the right.  Basilar meningeal enhancement is seen accounting for the soft tissue density on non-contrast scans.  There is some meningeal enhancement over the convexity.  Ventricular dilatation has been relieved somewhat by placement of a right frontal ventricular catheter which is in good position lying with its tip in the anterior third ventricle.
CT angiography shows major intracranial branch points reasonably well.  I do not see evidence for intracranial berry aneurysm.  There does not appear to be a proximal stenosis of the carotid or basilar arteries or the proximal aspects of the middle cerebral arteries bilaterally.  I see no occult berry aneurysm which might account for the patient?s previous subarachnoid hemorrhage.
IMPRESSION: 1.  Hydrocephalus, question obstructive at the level of the aqueduct vs. communicating, associated with a probable subependymal abscess near the left caudate,  as well as ventriculitis, and meningitis.
2.  Right frontal ventriculostomy in good position.
3.  No vascular anomaly detected which might have accounted for the previous history of subarachnoid bleed.  Remote right frontal surgery with encephalomalacia.

## 2006-04-23 IMAGING — CR DG CHEST 1V PORT
1 series · 1 of 1 positions shown · non-contrast
Comparison: Radiographs and CT done 07/26/05.

CLINICAL DATA: Unresponsive.  Postintubation. 
 PORTABLE CHEST ([DATE] HOURS):

[view not recorded]
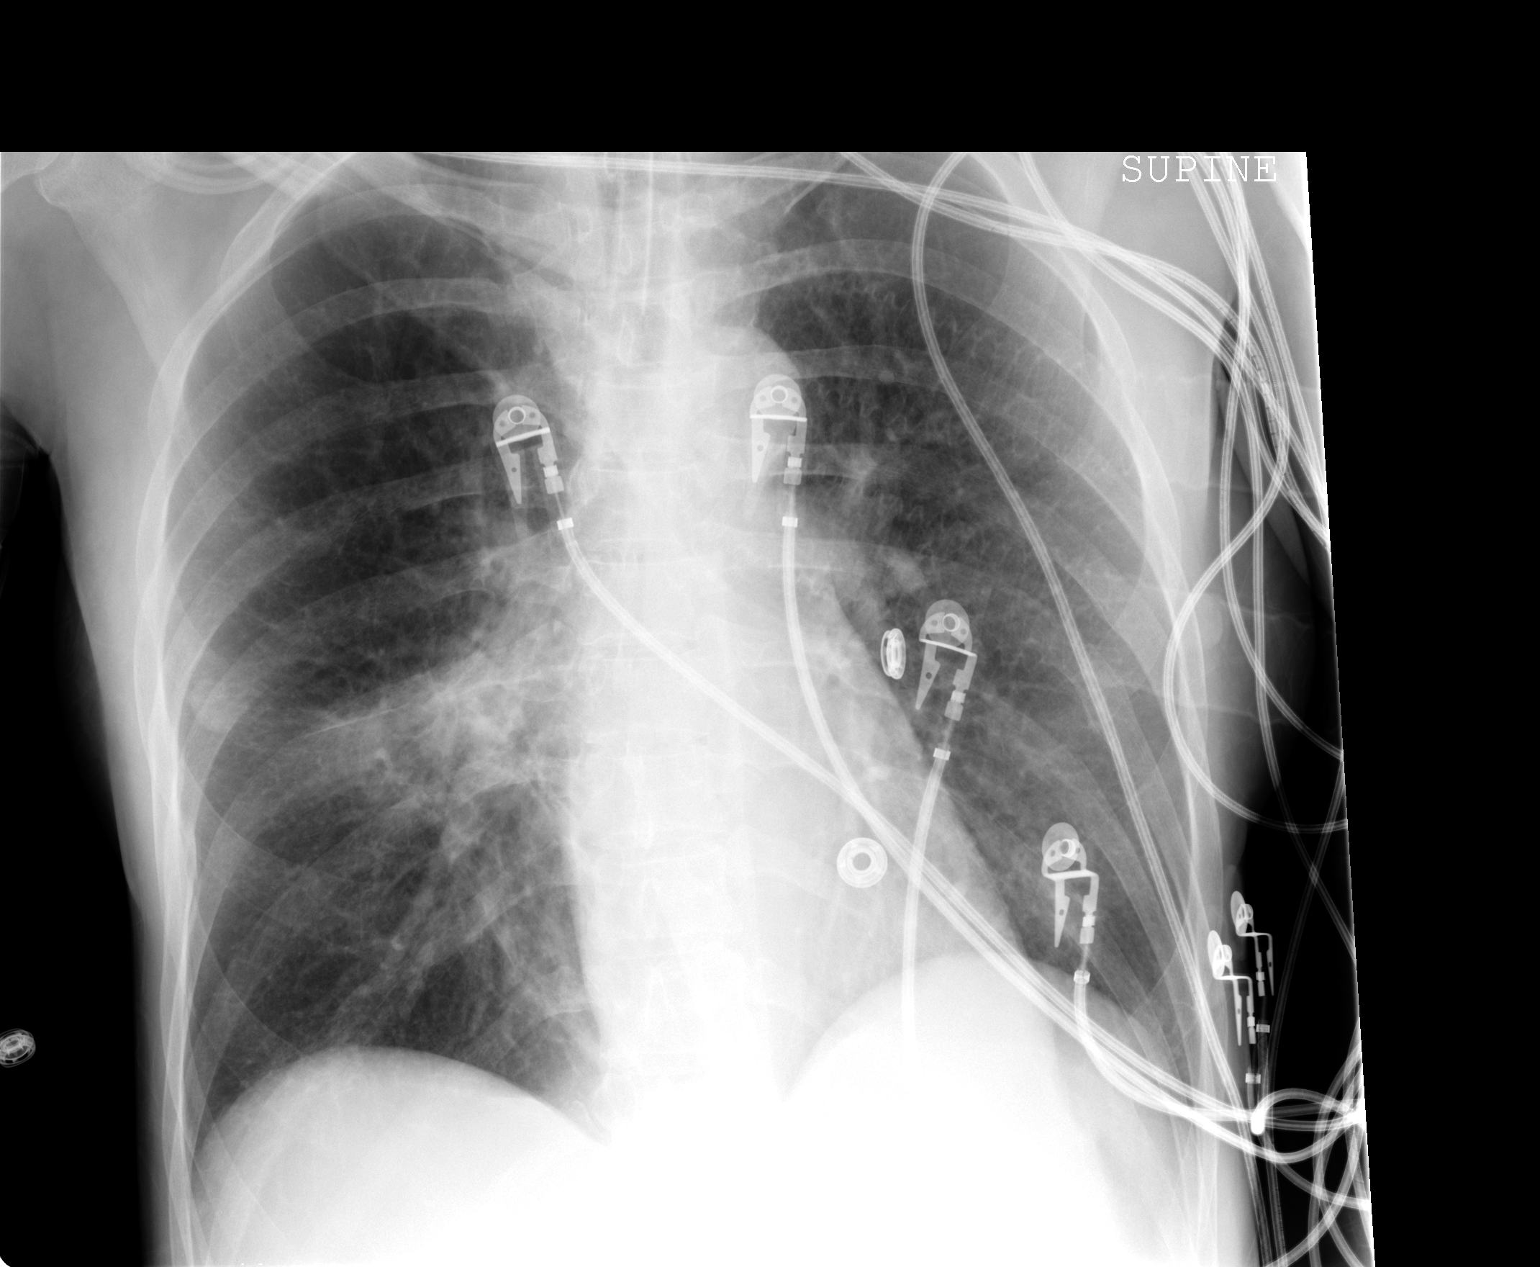

[1 of 1 positions shown; findings below may reference images not displayed]

FINDINGS: The patient is now intubated with an endotracheal tube in the midtrachea.  Cardiomediastinal contours are stable.  Cavitary airspace disease is again noted in the right middle lobe with increasing peripheral opacity.  A nodular density superimposed over the anterior aspect of the right 5th rib is probably the patient?s right nipple shadow.  The left lung appears clear.   There is no pleural effusion or pneumothorax.
IMPRESSION: 1.  Endotracheal tube appears well positioned. 
 2.  Slight worsening of cavitary airspace disease in the right middle lobe.  Please see recent chest CT results.

## 2006-04-24 IMAGING — CR DG CHEST 1V PORT
1 series · 1 of 1 positions shown · non-contrast
Comparison: 07/28/05.

CLINICAL DATA: PICC line placement.
 PORTABLE CHEST - 6RDTF ? 07/29/05 (0000):

[view not recorded]
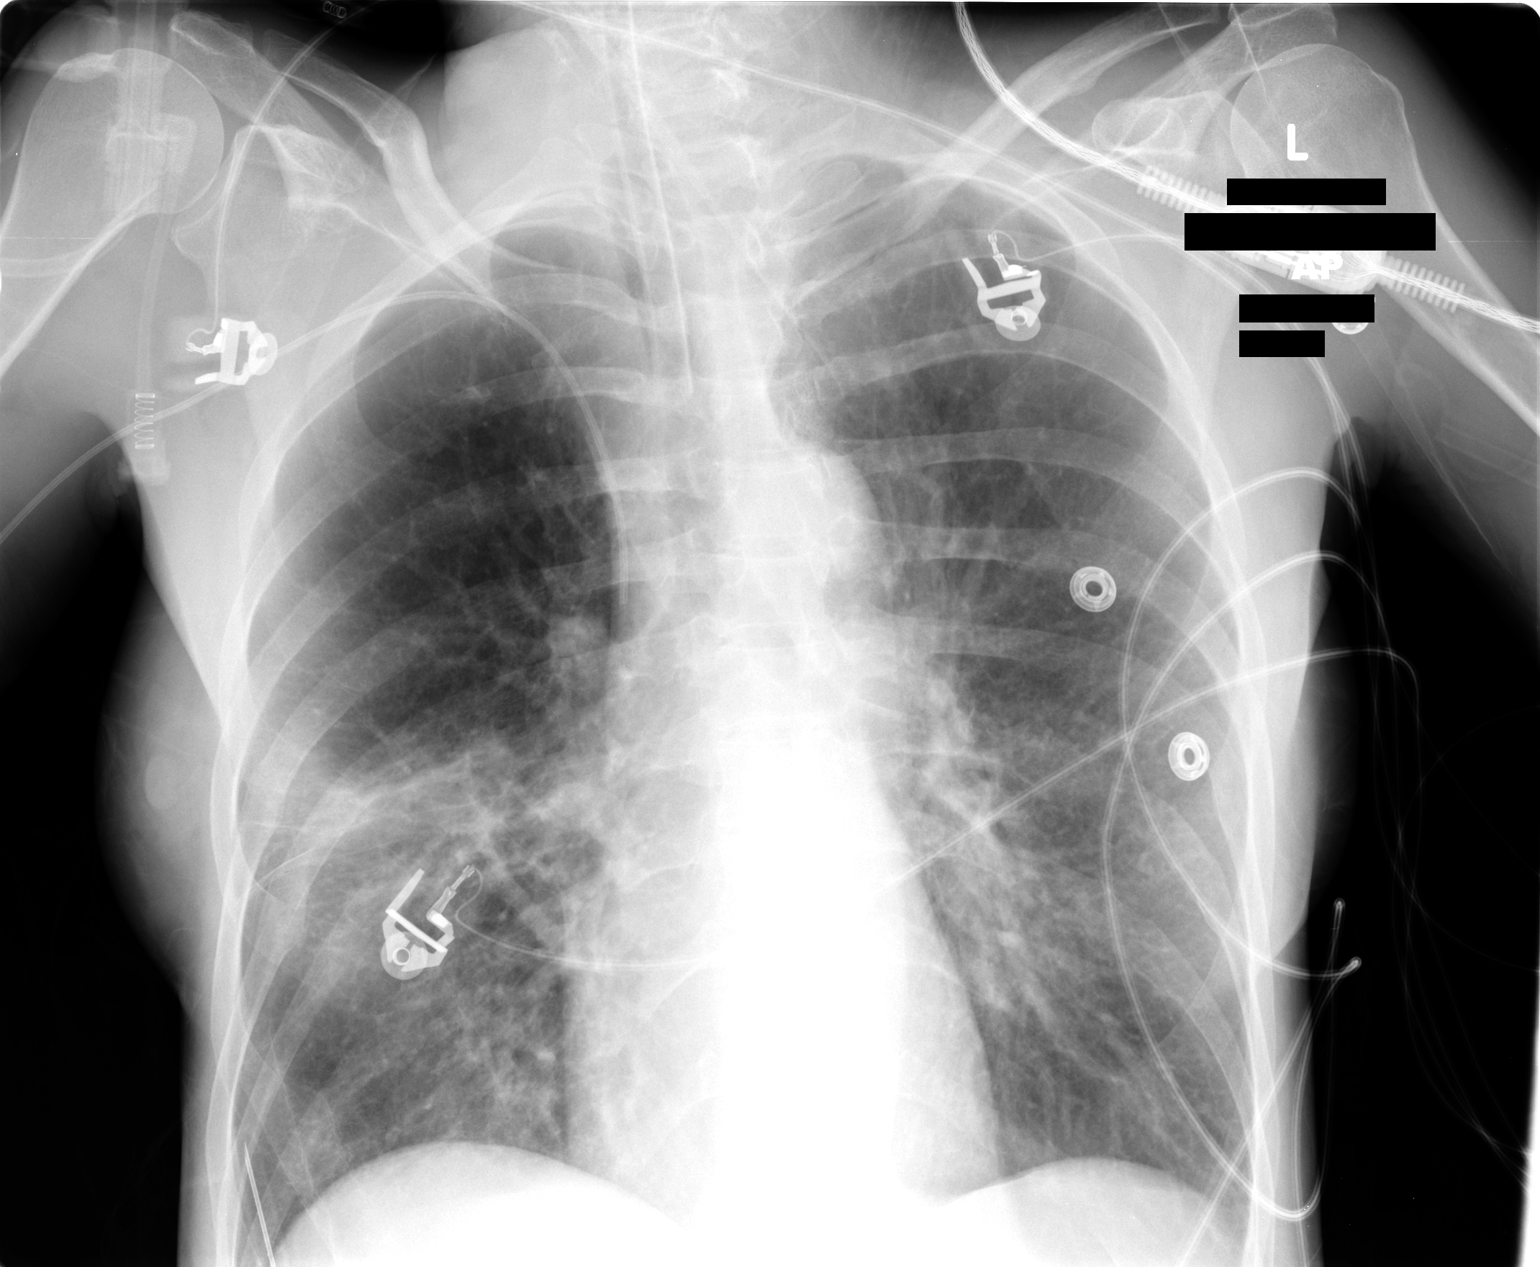

[1 of 1 positions shown; findings below may reference images not displayed]

A PICC line has been placed via right upper extremity route with the tip of the catheter in satisfactory position within the superior vena cava.  Endotracheal tube remains in satisfactory position.  There is mild improvement in aeration at the right mid lung zone and right perihilar region. The remainder of the study is unchanged.
IMPRESSION: PICC line tip within the superior vena cava.  Mild improvement in aeration in the right mid lung zone.

## 2006-04-24 IMAGING — CT CT HEAD W/O CM
1 series · 16 of 30 positions shown, 20 images · IV contrast (agent unspecified)
Comparison: 07/28/05
A right frontal ventriculostomy catheter is stable with tip in the region of the third ventricle.

CLINICAL DATA: Hydrocephalus.  Ventriculitis/meningitis.  Ventriculostomy catheter placement.
HEAD CT WITHOUT CONTRAST:
TECHNIQUE: Contiguous axial images were obtained from the base of the skull through the vertex according to standard protocol without contrast.

[Series 2: routinehead 4.8 h45s · axial · 0.40mm/px · z∈[-219,-79]mm · 16 of 33 slices shown, 20 images]
[im 2/33  brain]
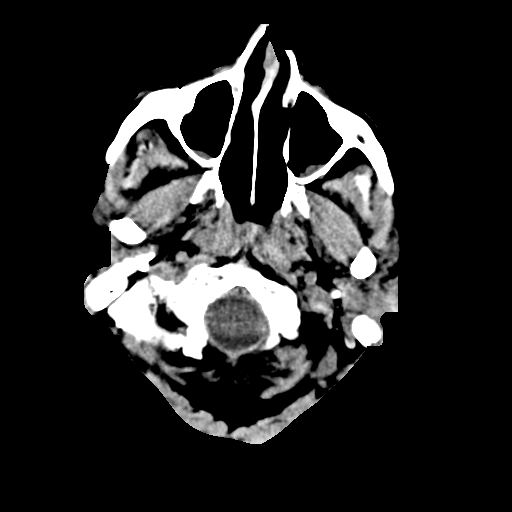
[im 2/33  bone]
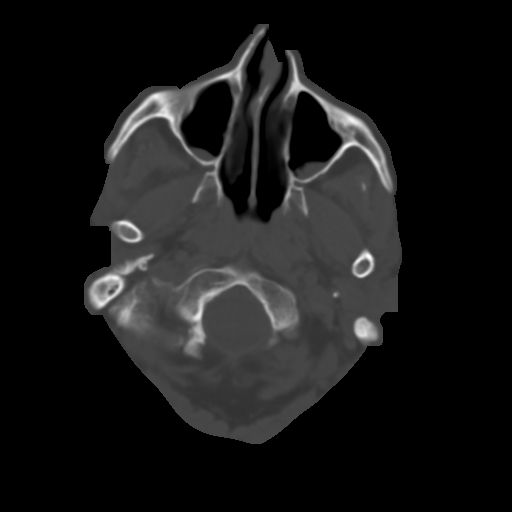
[im 4/33  brain]
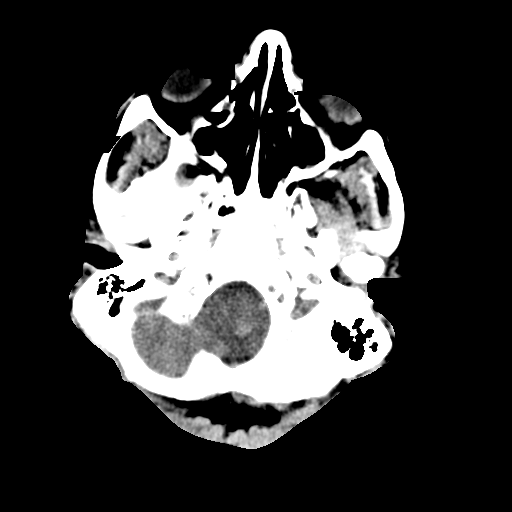
[im 6/33  brain]
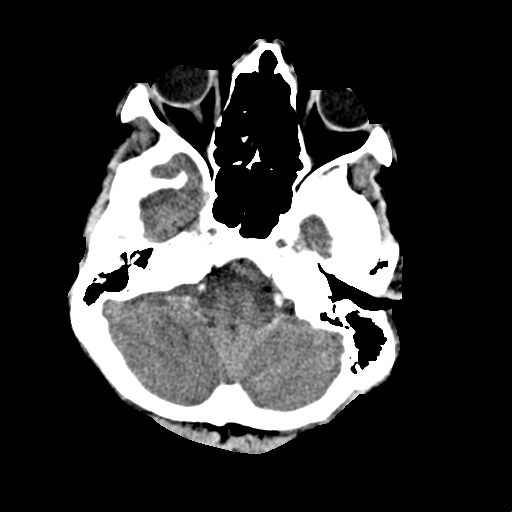
[im 8/33  brain]
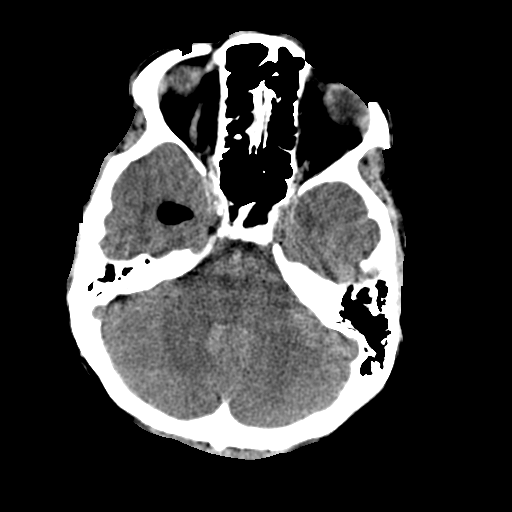
[im 9/33  brain]
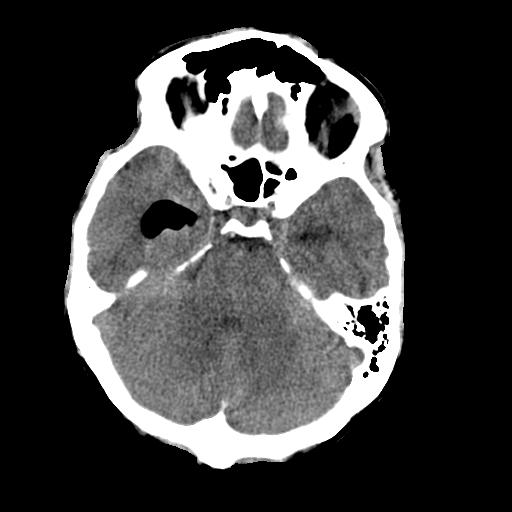
[im 9/33  bone]
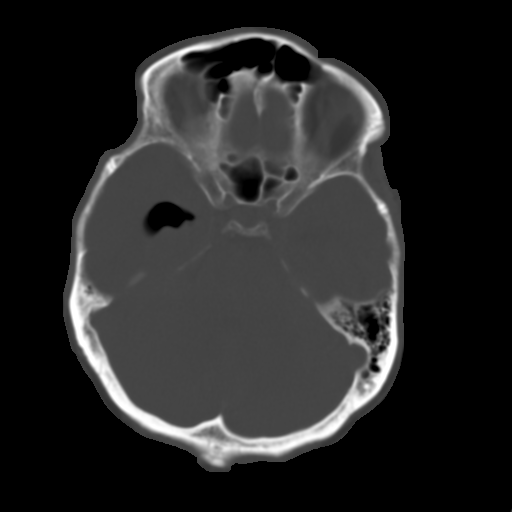
[im 12/33  brain]
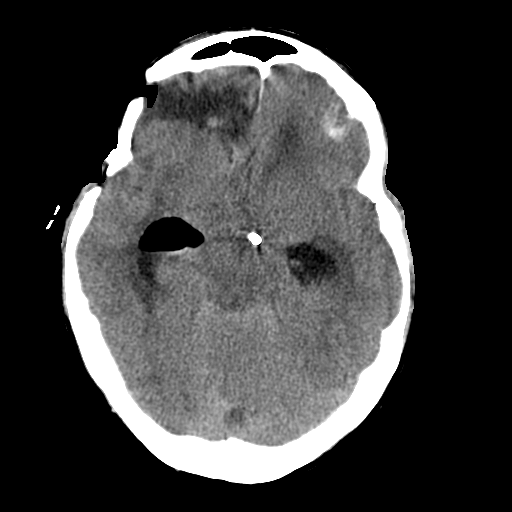
[im 14/33  brain]
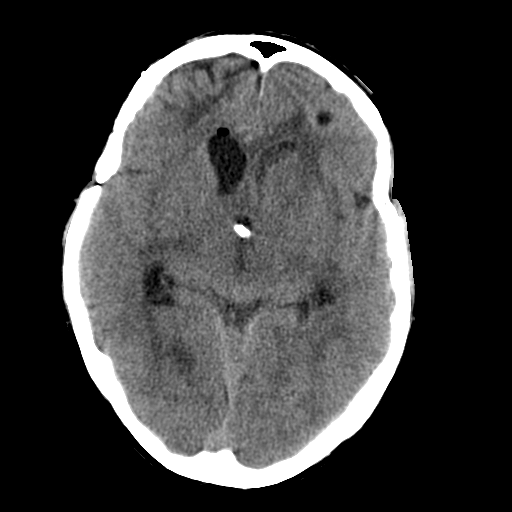
[im 16/33  brain]
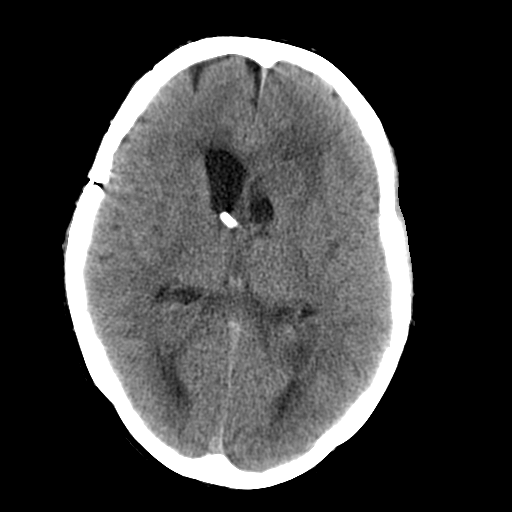
[im 17/33  brain]
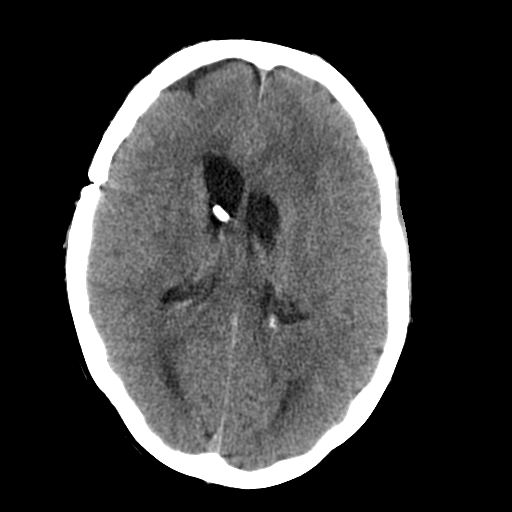
[im 17/33  bone]
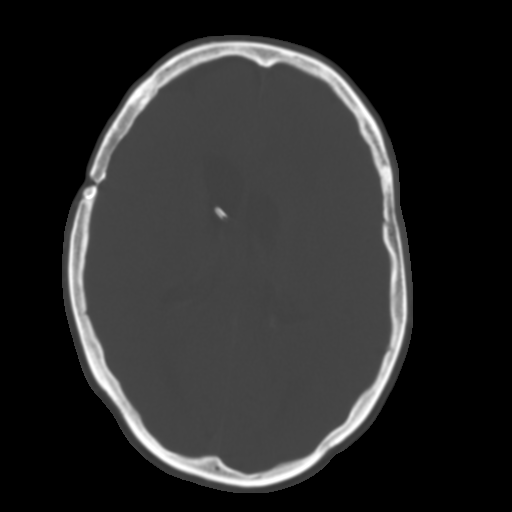
[im 19/33  brain]
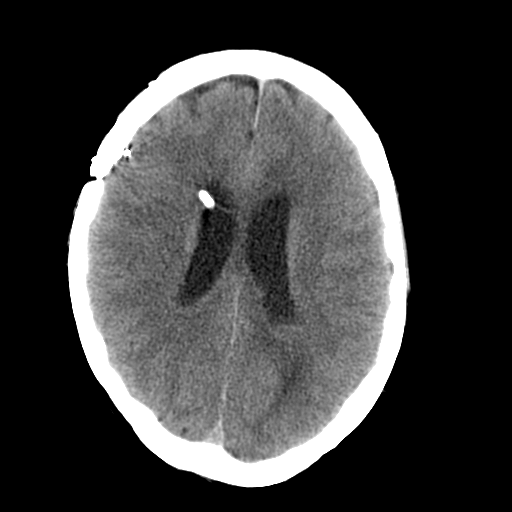
[im 21/33  brain]
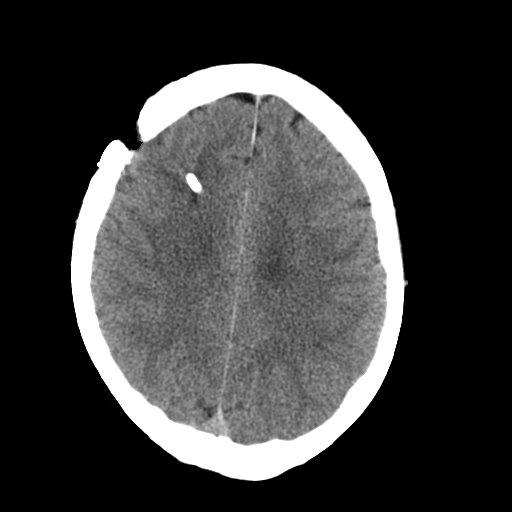
[im 24/33  brain]
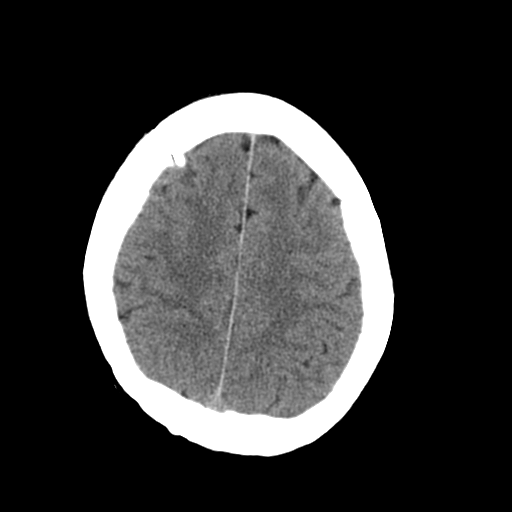
[im 25/33  brain]
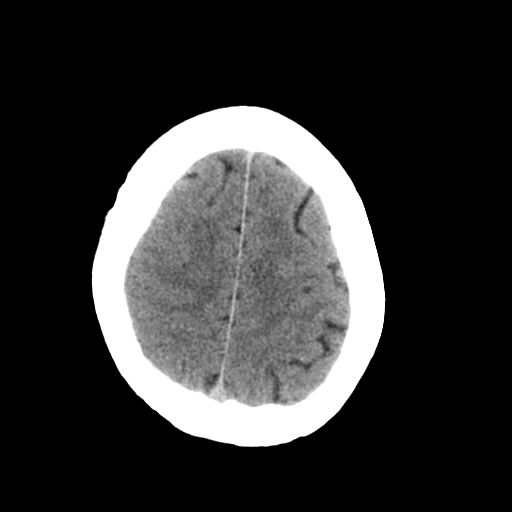
[im 25/33  bone]
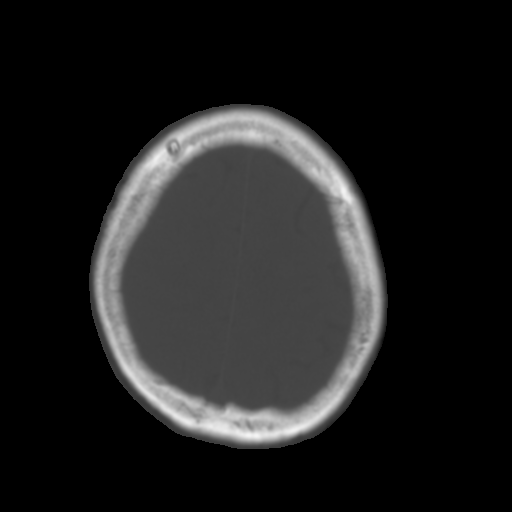
[im 27/33  brain]
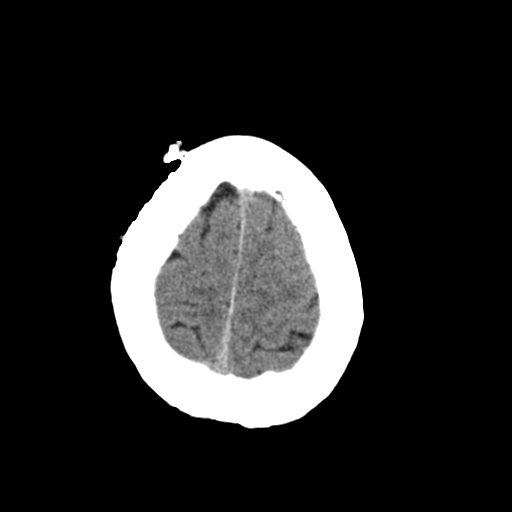
[im 29/33  brain]
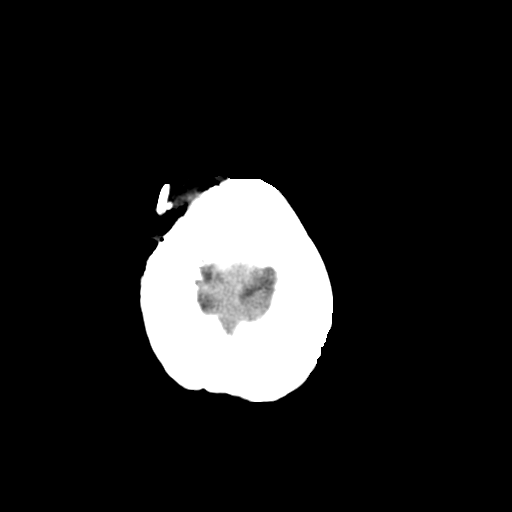
[im 31/33  brain]
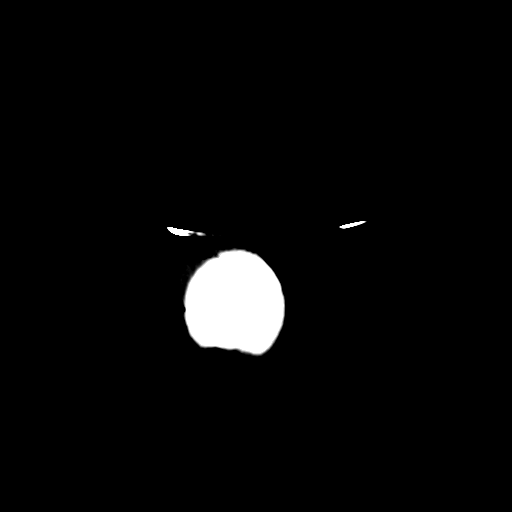

[16 of 30 positions shown; findings below may reference images not displayed]

Ventriculomegaly has decreased since the prior study.  Small amount of pneumocephalus in the right lateral ventricle is now noted.  Bifrontal encephalomalacia and dystrophic calcifications are again noted.  Lesion in the left caudate and complex material layering in the occipital horns bilaterally are relatively unchanged since the prior study.  No other significant changes are noted.
IMPRESSION: Decreasing ventricular size and pneumocephalus, otherwise no other changes.

## 2006-04-25 IMAGING — CR DG CHEST 1V PORT
1 series · 1 of 1 positions shown · non-contrast
Comparison: 07/29/05
 Stable positioning of endotracheal tube.

CLINICAL DATA: Respiratory failure and pulmonary disease.  Hydrocephalus and intracranial infection.  
 PORTABLE CHEST- 1 VIEW (4420 hours):

[view not recorded]
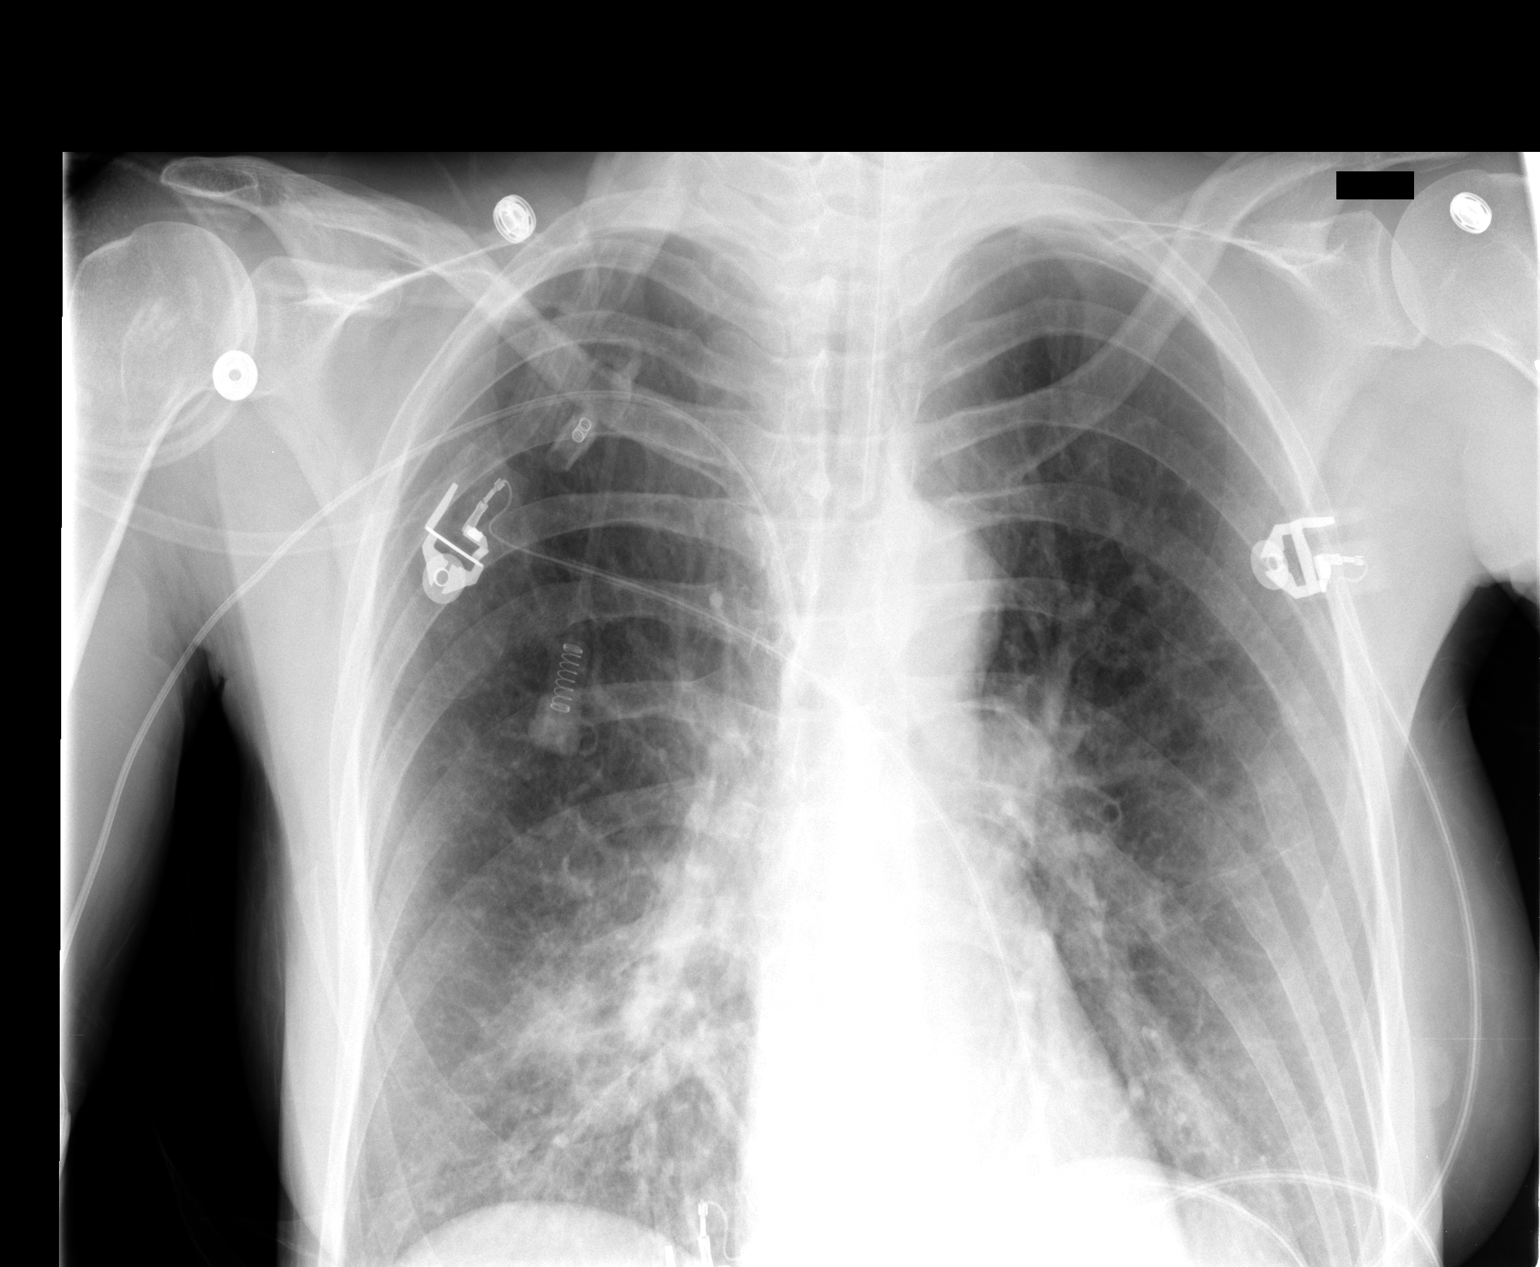

[1 of 1 positions shown; findings below may reference images not displayed]

Persistent atelectasis and infiltrate in both mid- to lower lung zones, right greater than left.  No edema or visualized pleural effusions.
IMPRESSION: Relatively stable bilateral airspace disease right greater than left.

## 2006-04-25 IMAGING — CR DG ABD PORTABLE 1V
1 series · 1 of 1 positions shown · non-contrast
Comparison: none

CLINICAL DATA: Intracranial hemorrhage.  Panda tube placement.
 PORTABLE ABDOMEN- 1 VIEW:
 An AP supine film of the abdomen made 07/30/05 at 7444 hours shows the panda tube tip to be in the fundus of the stomach.

[view not recorded]
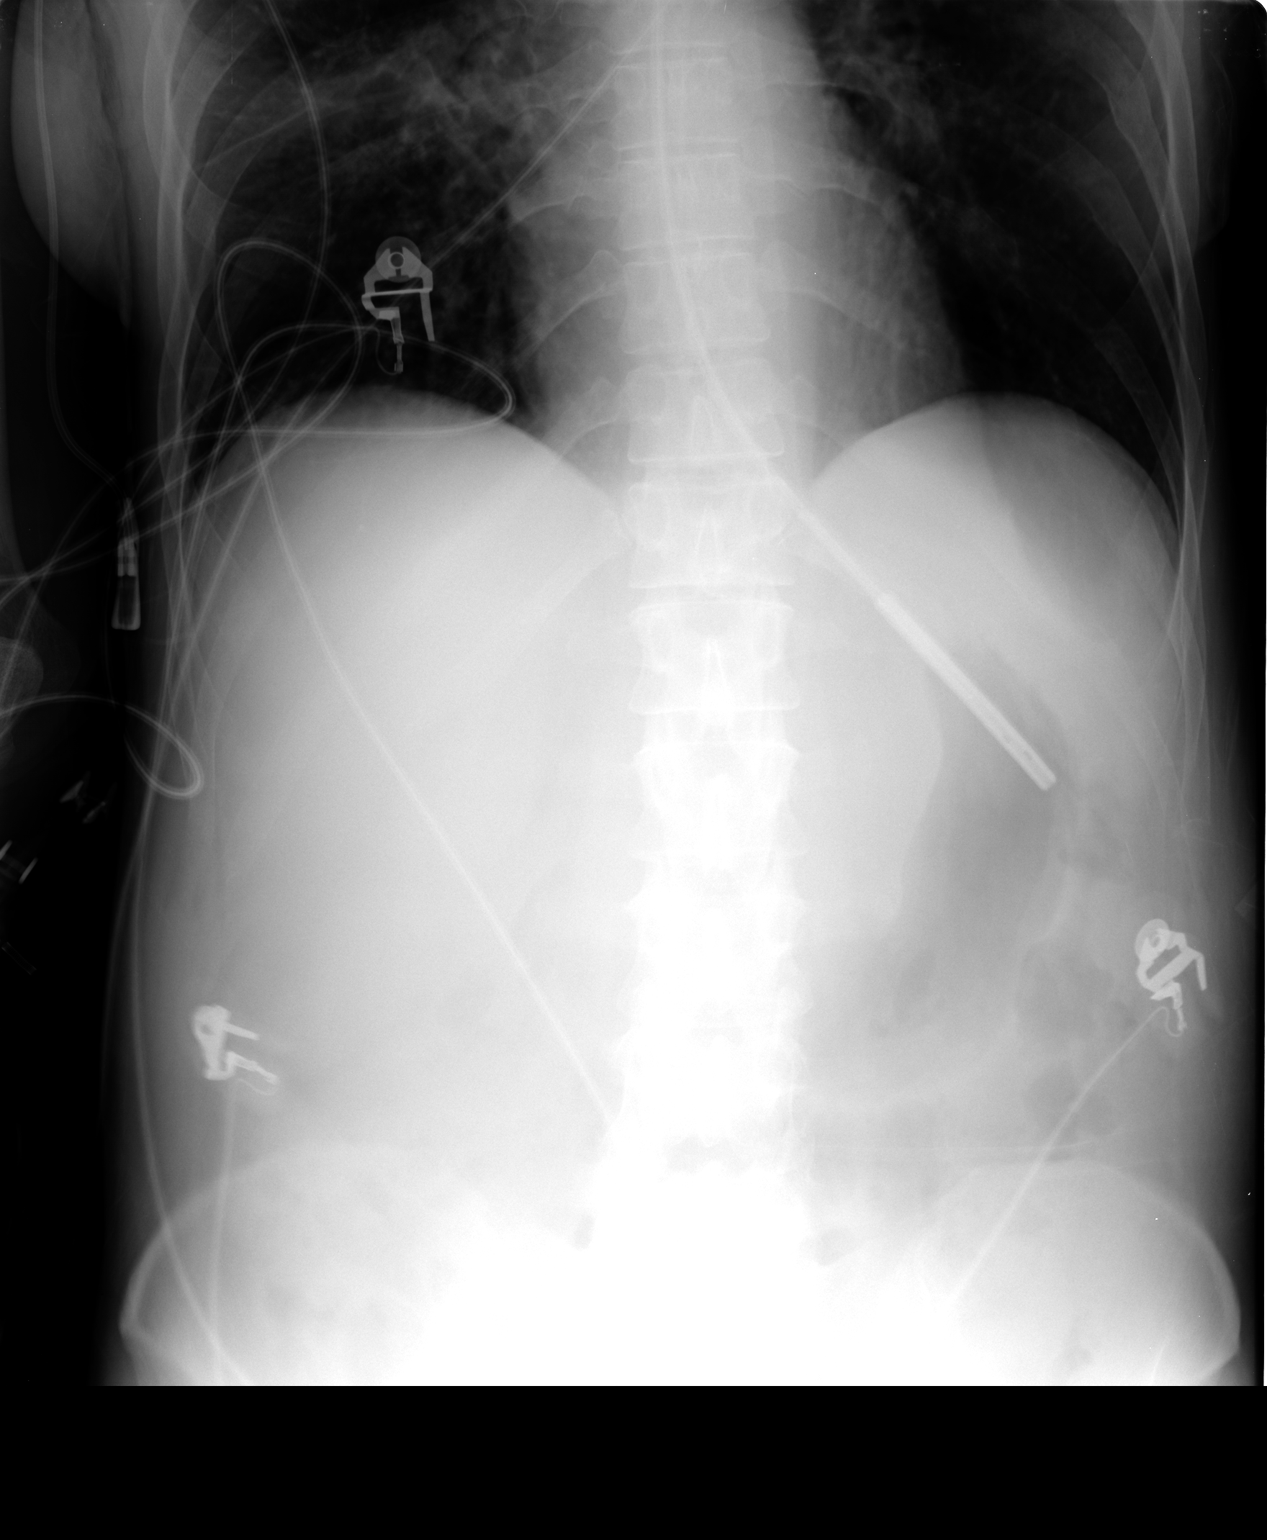

[1 of 1 positions shown; findings below may reference images not displayed]

IMPRESSION: Panda tip fundus of the stomach.

## 2006-04-25 IMAGING — CR DG ABD PORTABLE 1V
1 series · 1 of 1 positions shown · non-contrast
Comparison: Portable abdomen x-ray earlier 2788 hours.

CLINICAL DATA: Feeding tube repositioning.

PORTABLE ABDOMEN - 1 VIEW  [DATE]/5880 6263 hours:

[view not recorded]
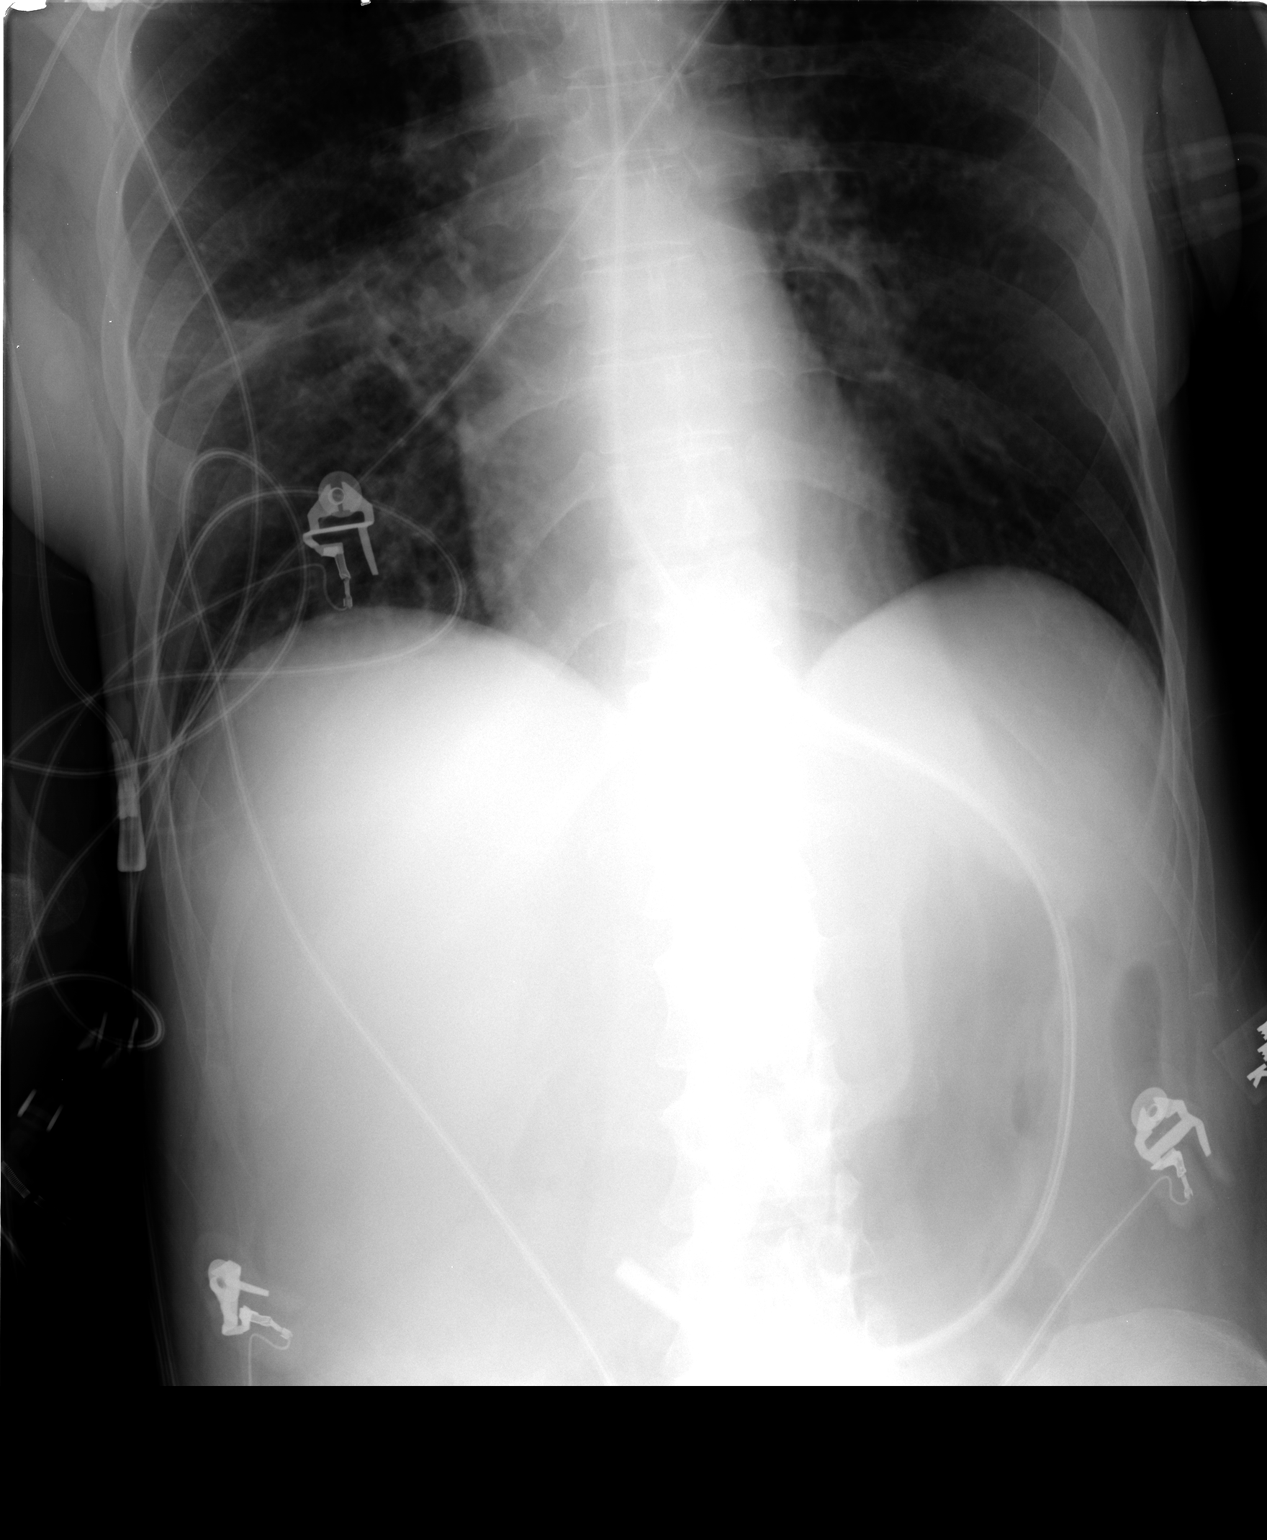

[1 of 1 positions shown; findings below may reference images not displayed]

FINDINGS: The feeding tube has been advanced tip is now at the gastric antrum,
near the pylorus. The visualized bowel gas pattern remains unremarkable.
IMPRESSION: Feeding tube tip now at the gastric antrum near the pylorus.

## 2006-04-26 IMAGING — CR DG CHEST 1V PORT
1 series · 1 of 1 positions shown · non-contrast
Comparison: 07/30/05.

CLINICAL DATA: Respiratory failure.  Revision of intracranial shunt for infection.
 PORTABLE CHEST - 1 VIEW, 8148 HOURS, 07/31/05:

[view not recorded]
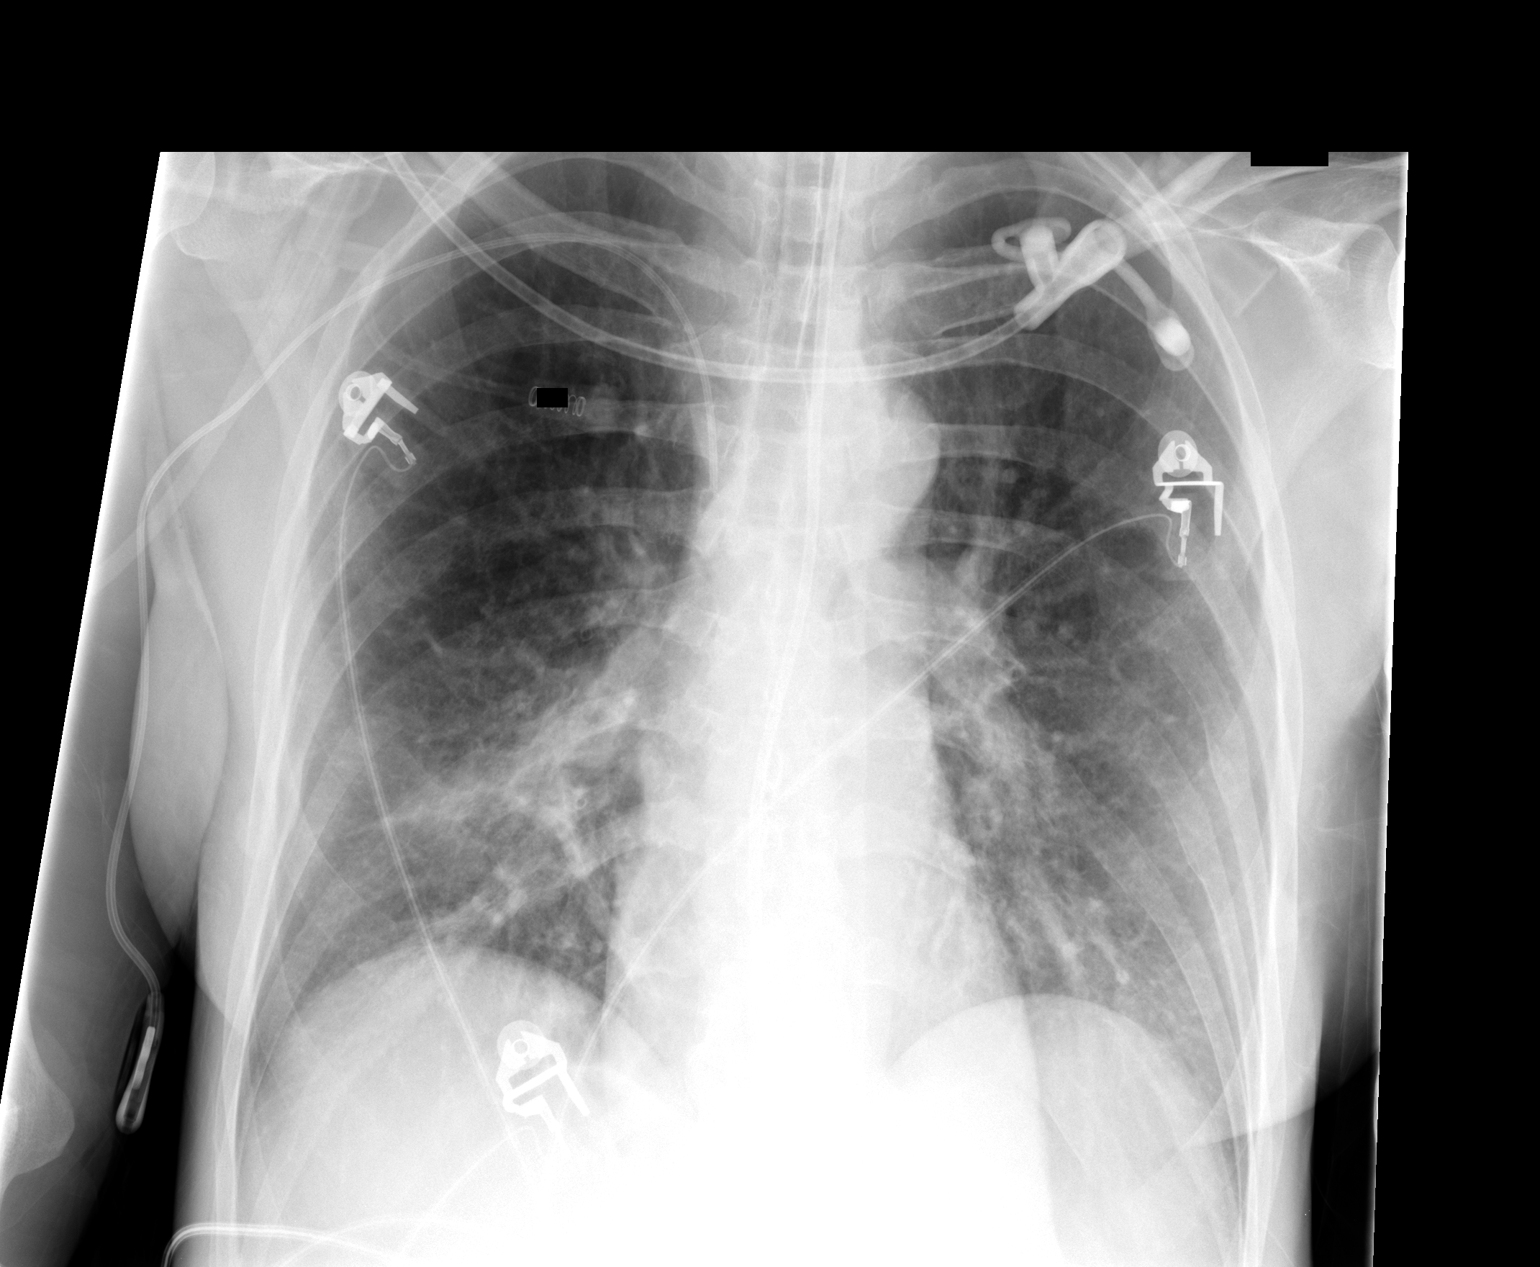

[1 of 1 positions shown; findings below may reference images not displayed]

FINDINGS: Stable positioning of endotracheal tube and PICC line.  Interval improved aeration of both lower lung zones, with resolving infiltrates.  No edema.  Heart size normal.
IMPRESSION: Interval improvement in bilateral infiltrates.

## 2006-04-27 IMAGING — CR DG CHEST 1V
1 series · 1 of 1 positions shown · non-contrast
Comparison: 07/31/05.

CLINICAL DATA: Intracranial hemorrhage, follow-up.
 CHEST ? 1 VIEW ? 08/01/04 AT 1348 HOURS:

[view not recorded]
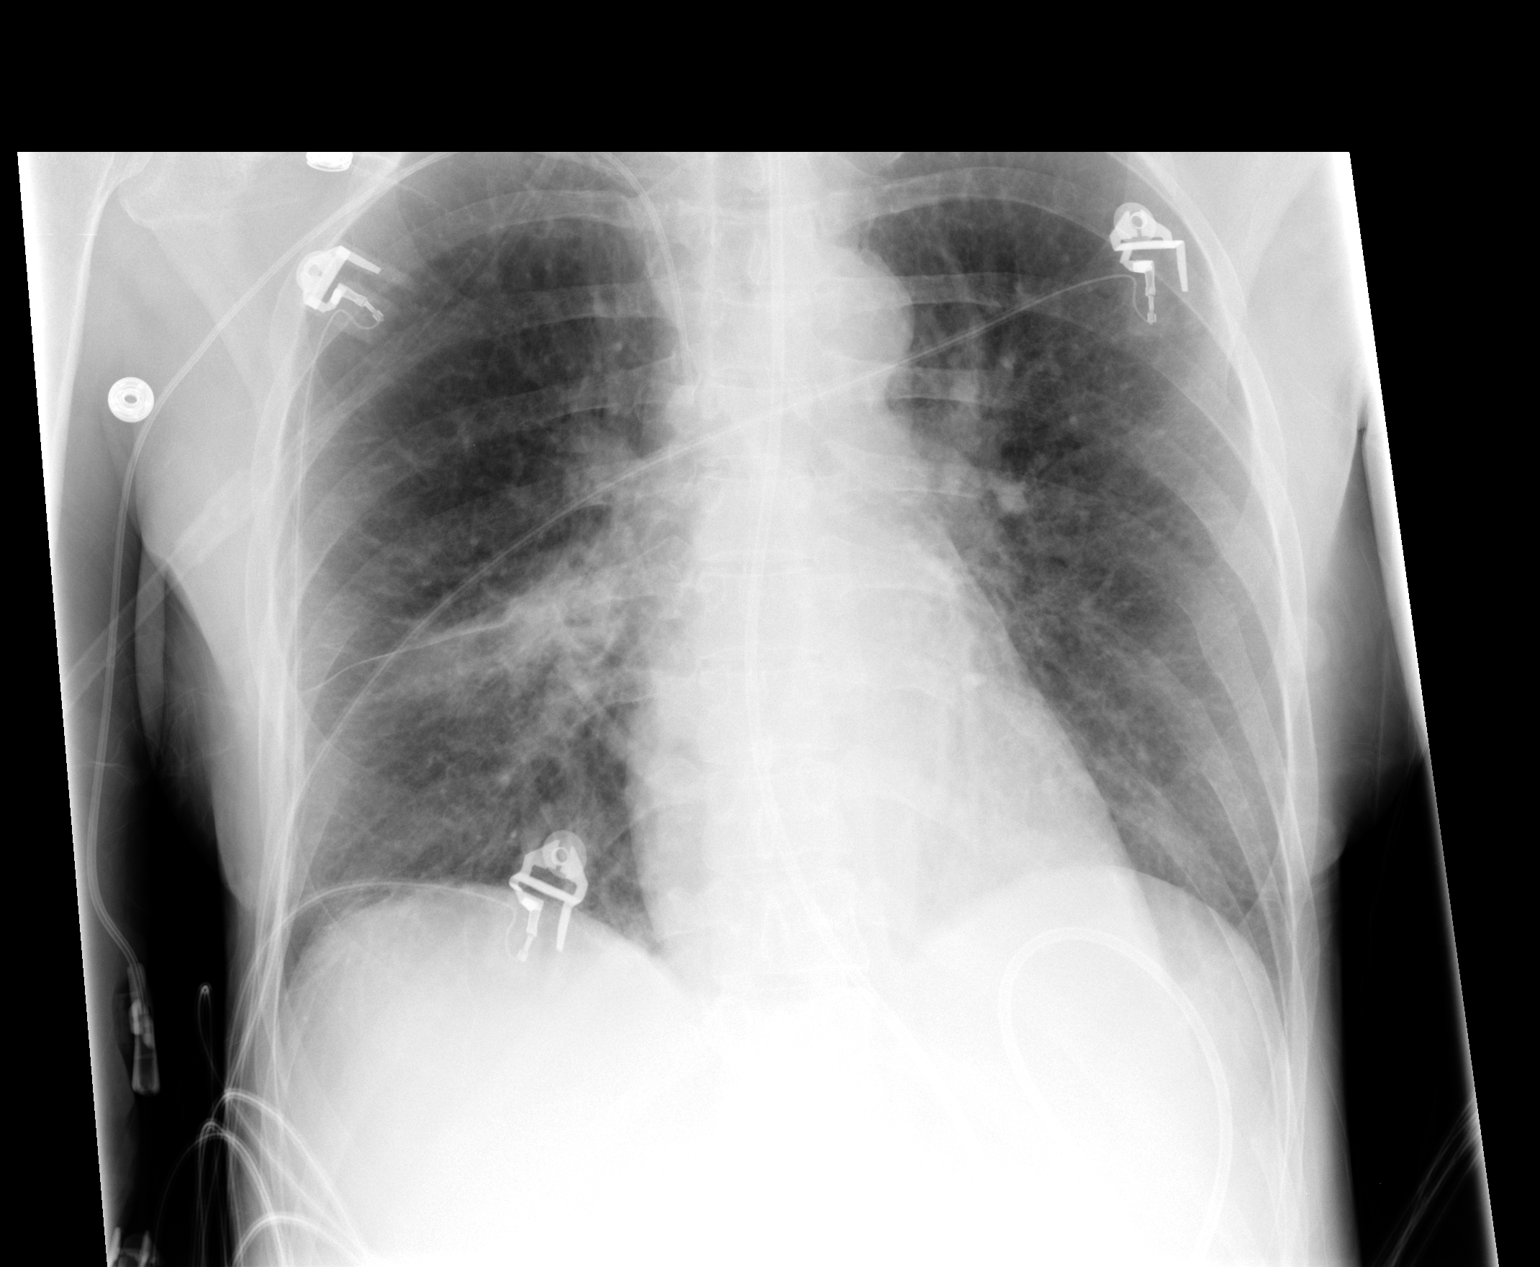

[1 of 1 positions shown; findings below may reference images not displayed]

FINDINGS: Portable view of the chest shows persistent patchy opacity in the right infrahilar region.  The left lung is clear.  Heart size is stable.  Right central venous line is present with tip in the SVC and a feeding tube remains.  The endotracheal tube has been removed.
IMPRESSION: Endotracheal tube removed.  Patchy opacity remains in the right infrahilar region.

## 2006-04-27 IMAGING — CT CT HEAD W/O CM
1 of 2 series · 13 of 30 positions shown, 17 images · IV contrast (agent unspecified)
Comparison: 07/29/05.

CLINICAL DATA: Intracranial hemorrhage, hydrocephalus.  Postop.  Followup.
 HEAD CT WITHOUT CONTRAST:
TECHNIQUE: Contiguous axial images were obtained from the base of the skull through the vertex, according to standard protocol, without contrast.

[Series 2: brain · axial · 0.47mm/px · z∈[+137,+266]mm · 13 of 32 slices shown, 17 images]
[im 3/32  brain]
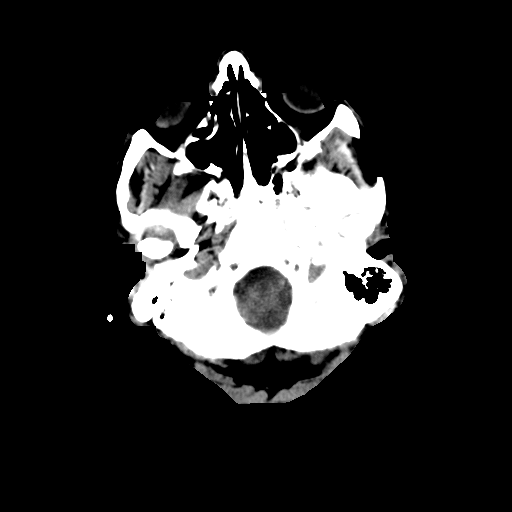
[im 3/32  bone]
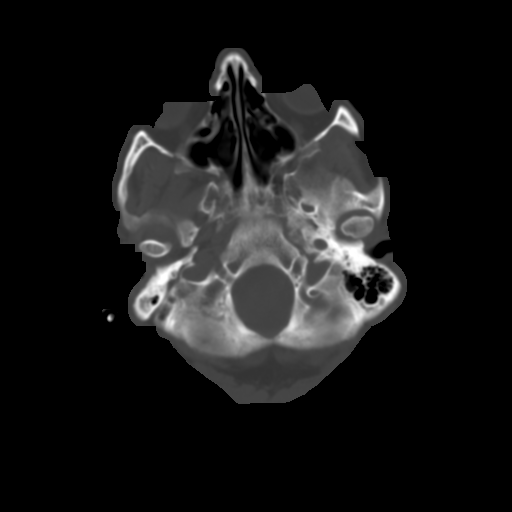
[im 5/32  brain]
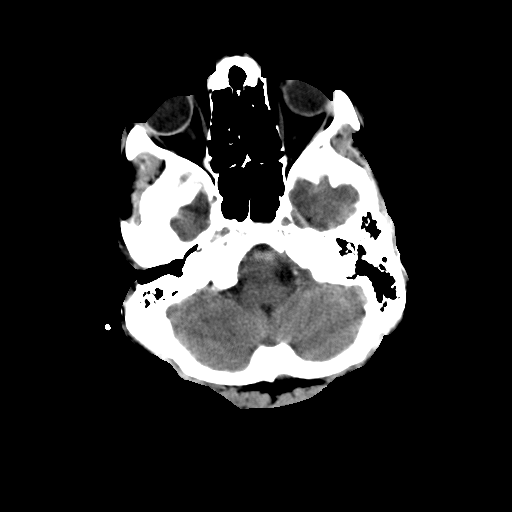
[im 7/32  brain]
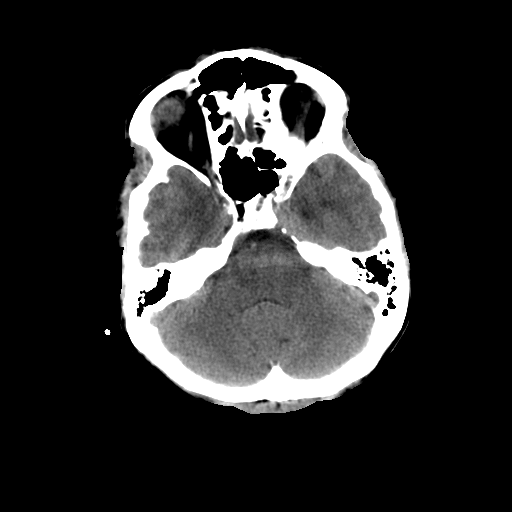
[im 9/32  brain]
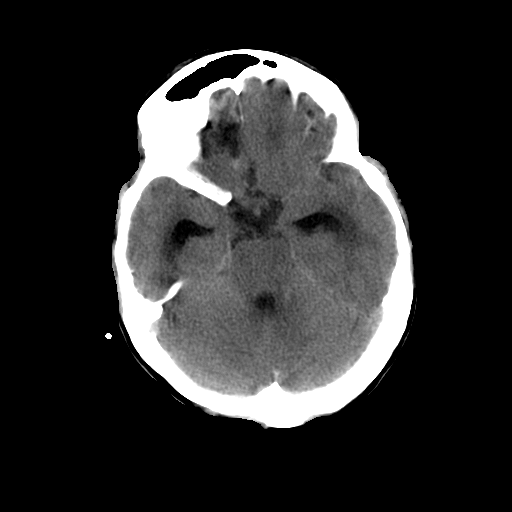
[im 12/32  brain]
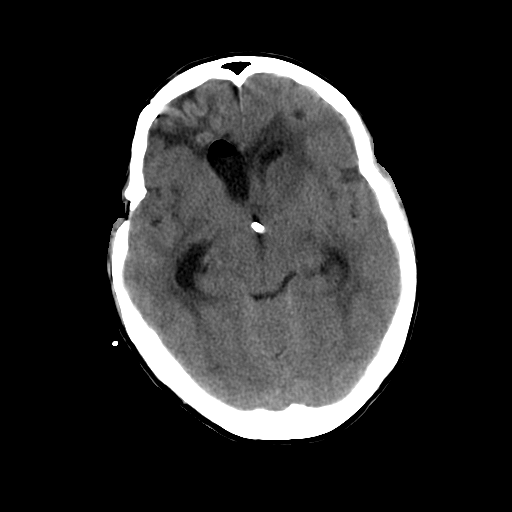
[im 12/32  bone]
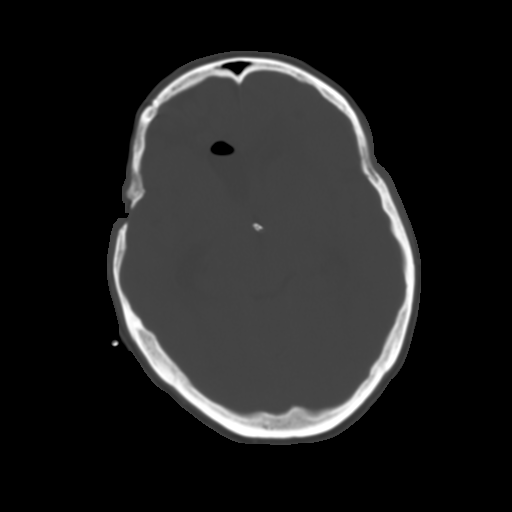
[im 14/32  brain]
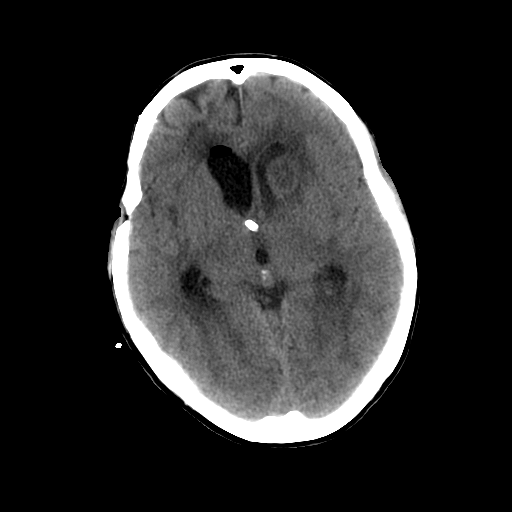
[im 16/32  brain]
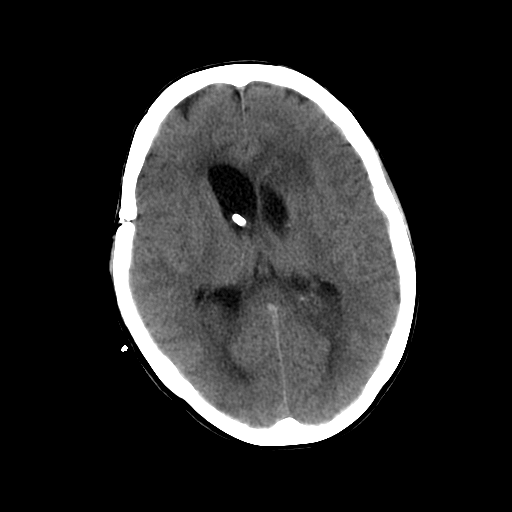
[im 18/32  brain]
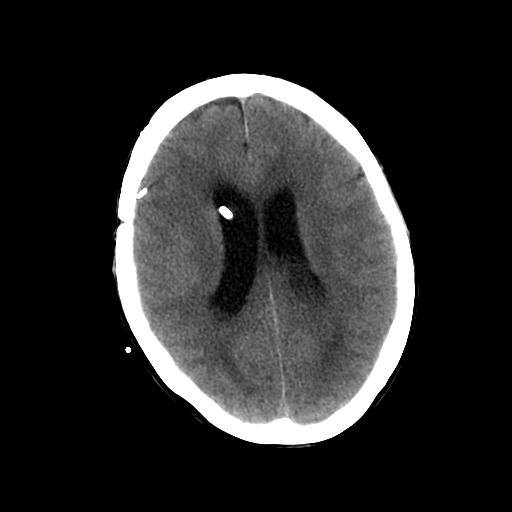
[im 20/32  brain]
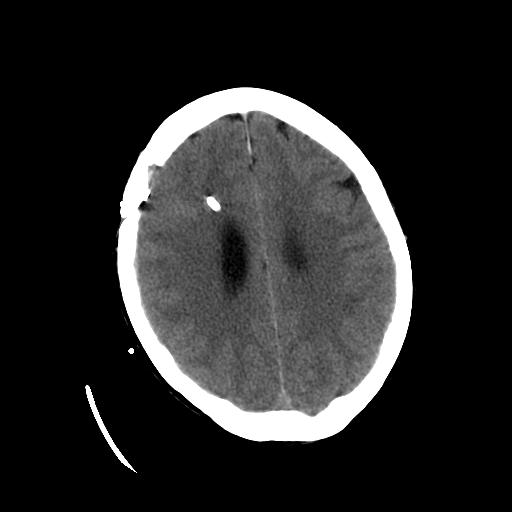
[im 20/32  bone]
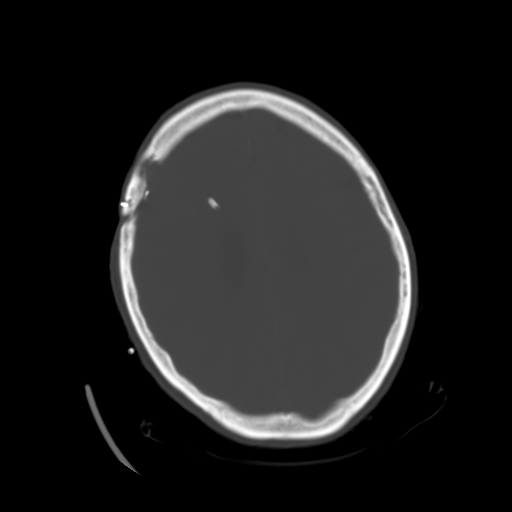
[im 23/32  brain]
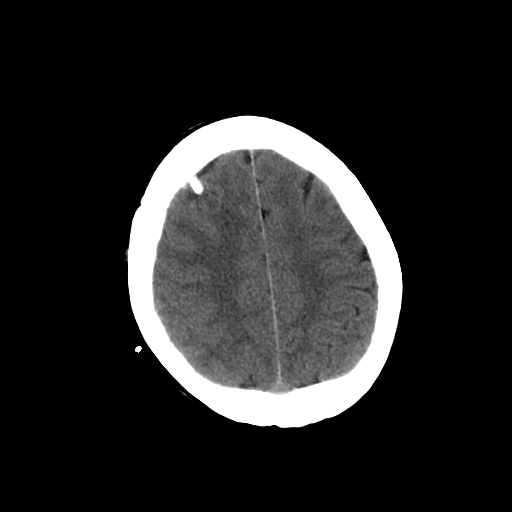
[im 25/32  brain]
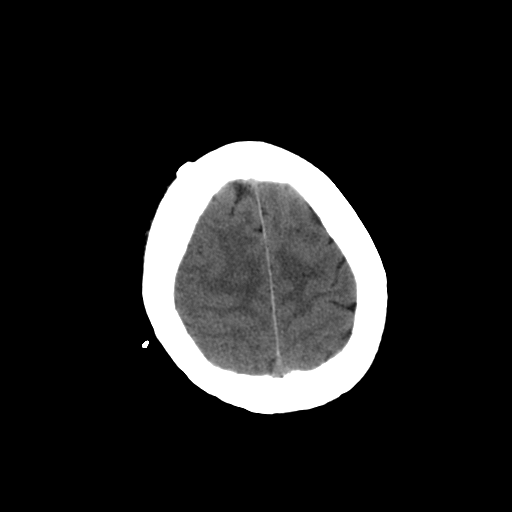
[im 27/32  brain]
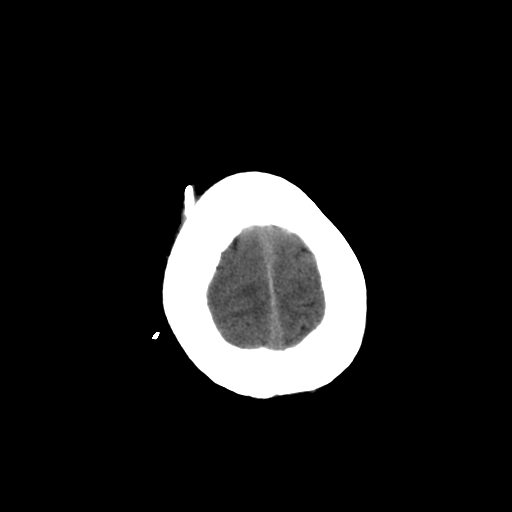
[im 29/32  brain]
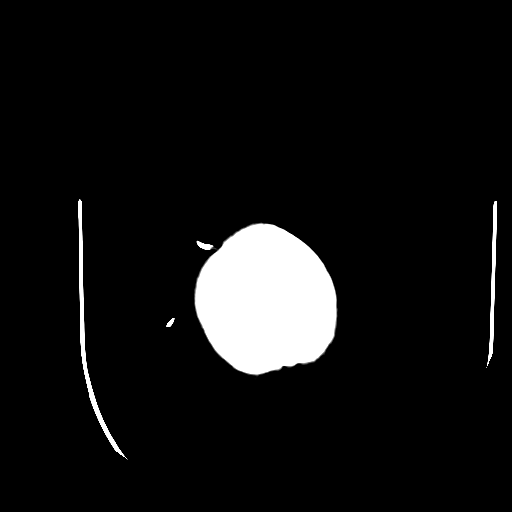
[im 29/32  bone]
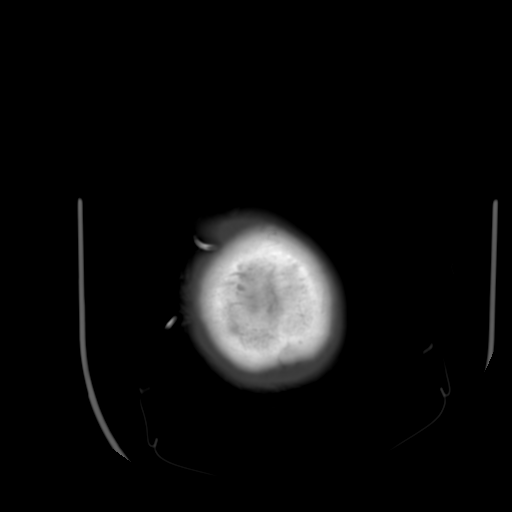

[13 of 30 positions shown; findings below may reference images not displayed]

Right frontal craniotomy, right frontal ventriculostomy catheter, tip slightly to the left of midline.  Hydrocephalus, pneumocephalus, complex material layering in the occipital horns, bifrontal encephalomalacia and dystrophic calcification, and left caudate lesion are relatively unchanged since the prior study.  Little significant change is noted since the prior study.
IMPRESSION: Stable head CT.

## 2006-05-01 IMAGING — CT CT HEAD WO/W CM
3 of 4 series · 17 of 30 positions shown, 19 images · IV contrast (omnipaque)
Comparison: 08/01/05.

CLINICAL DATA: Intracranial hemorrhage. 
 HEAD CT WITHOUT AND WITH CONTRAST ([DATE] HOURS):
TECHNIQUE: Contiguous axial images were obtained from the base of the skull through the vertex according to standard protocol before and after administration of intravenous contrast.
 Contrast:  100 cc Omnipaque 300

[Series 2: brain · axial · 0.49mm/px · z∈[+114,+231]mm · 7 of 32 slices shown, 9 images (1 of 2)]
[im 4/32  brain]
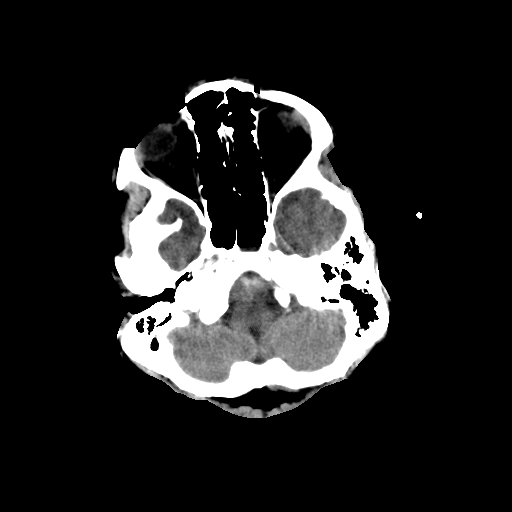
[im 4/32  bone]
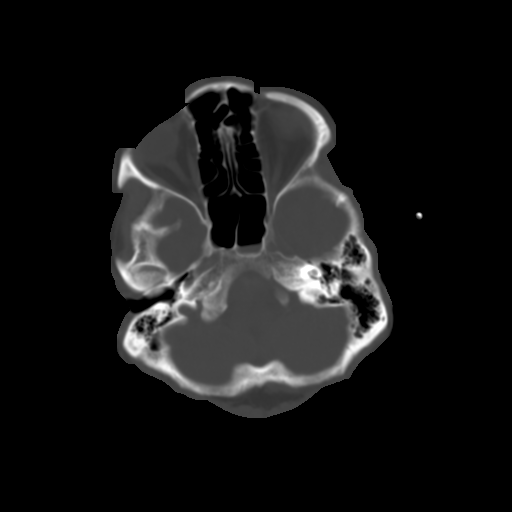
[im 8/32  brain]
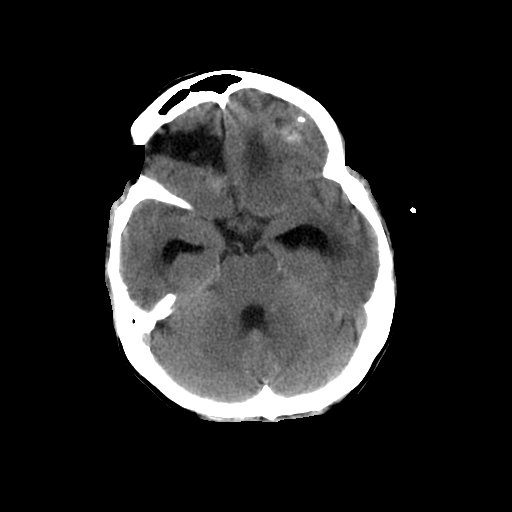
[im 12/32  brain]
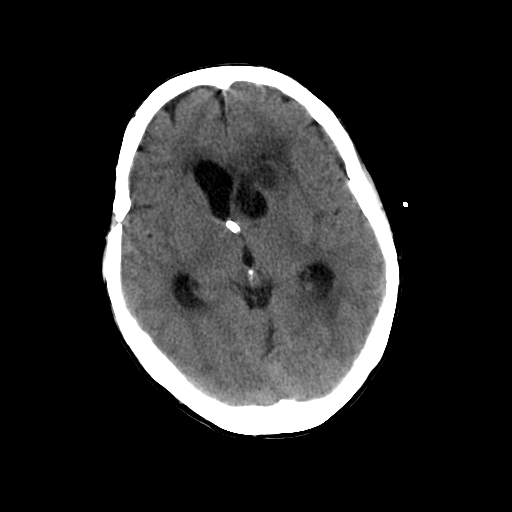
[im 16/32  brain]
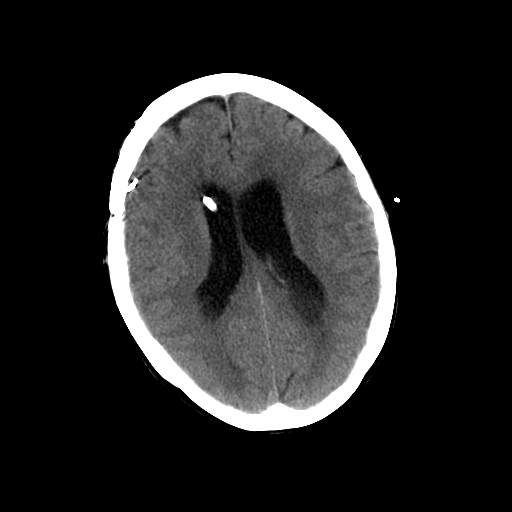
[im 20/32  brain]
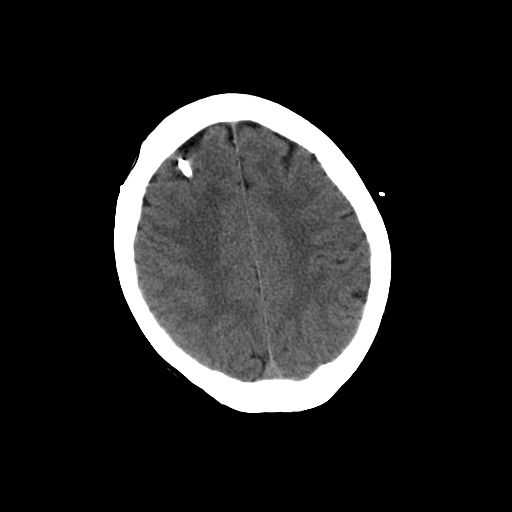
[im 20/32  bone]
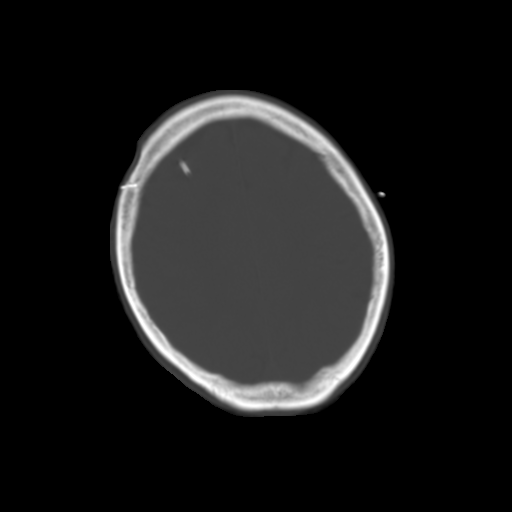
[im 24/32  brain]
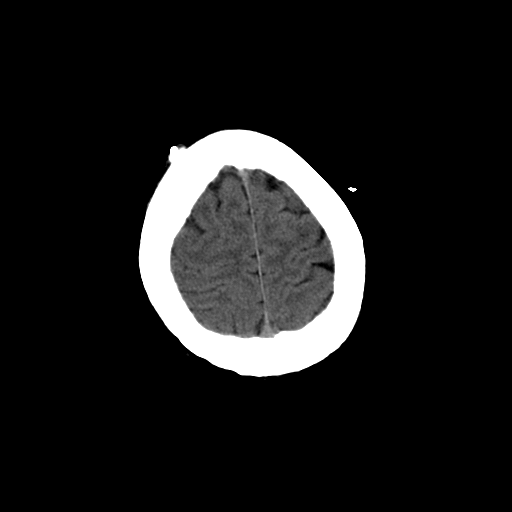
[im 28/32  brain]
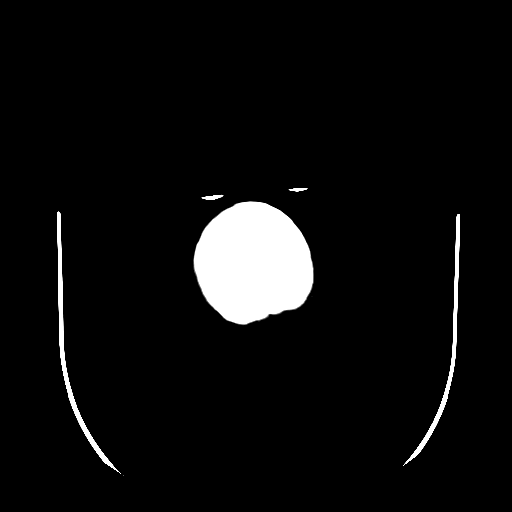

[Series 3: recon 2: brain · axial · 0.49mm/px · z∈[+119,+206]mm · 5 of 28 slices shown]
[im 5/28  brain]
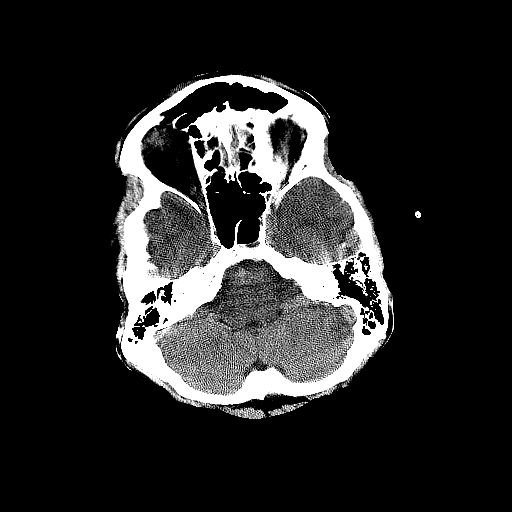
[im 10/28  brain]
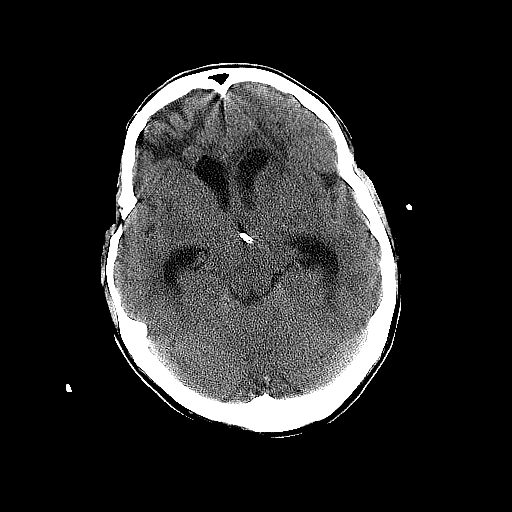
[im 14/28  brain]
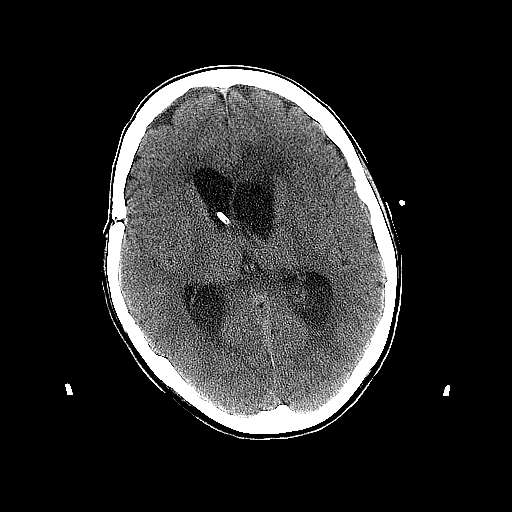
[im 19/28  brain]
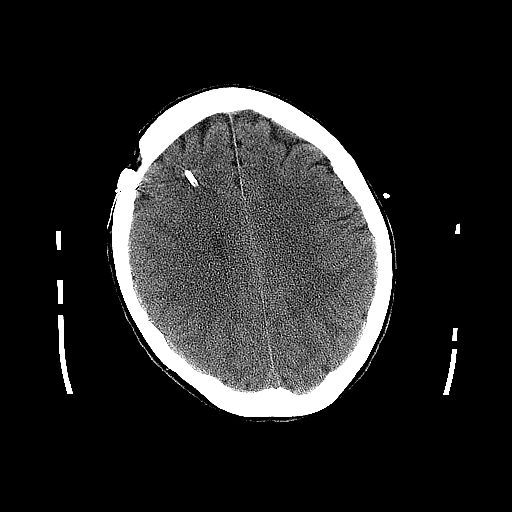
[im 23/28  brain]
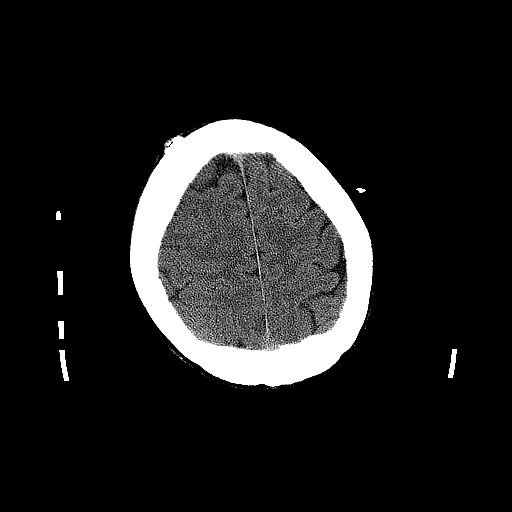

[Series 4: brain · axial · 0.49mm/px · z∈[+120,+208]mm · 5 of 28 slices shown (2 of 2)]
[im 5/28  brain]
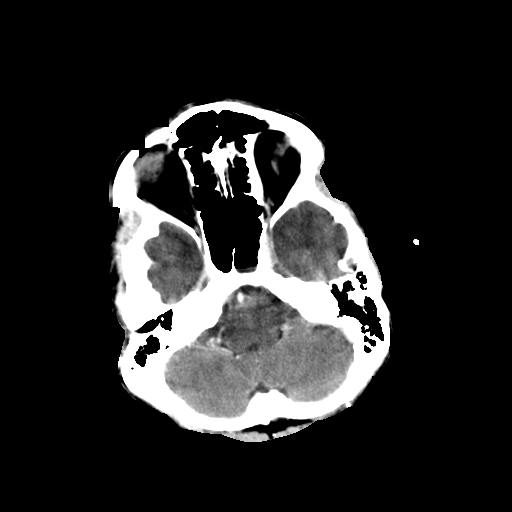
[im 10/28  brain]
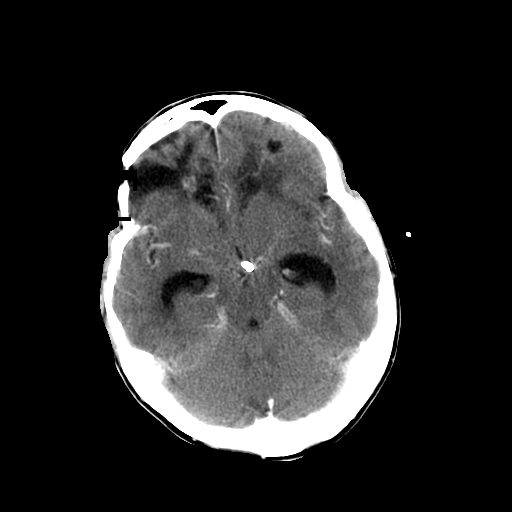
[im 14/28  brain]
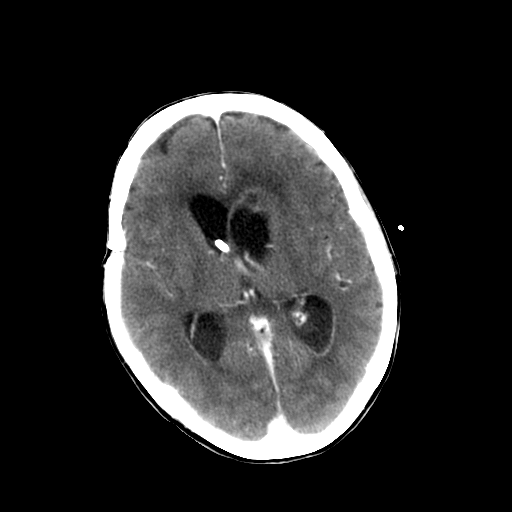
[im 19/28  brain]
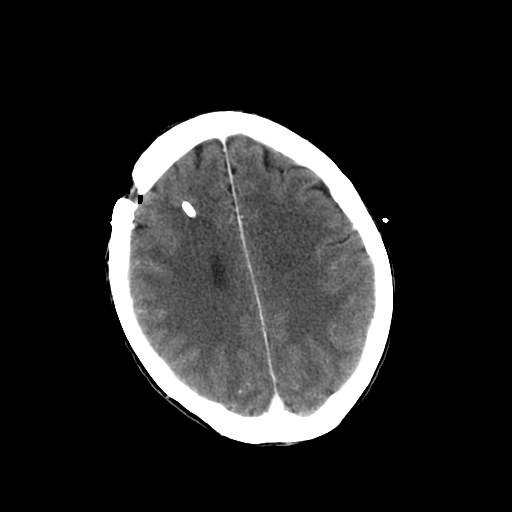
[im 23/28  brain]
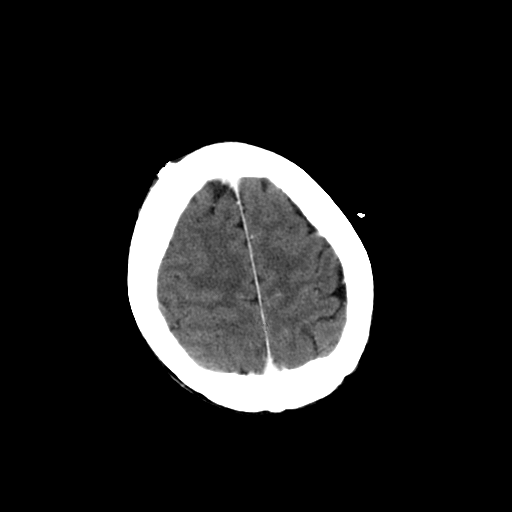

[17 of 30 positions shown; findings below may reference images not displayed]

FINDINGS: The ventriculostomy catheter is stable.  Ventricular dilatation has slightly improved.  Slightly higher density than CSF material in the occipital horns is unchanged.  The left caudate head lesion is stable.  Pneumocephalus has improved.  A right frontal craniotomy defect is again noted.  Intracranial hemorrhage in the frontal lobes has slightly improved.  Contrast enhanced images demonstrate rim enhancement of the caudate lesion as well as enhancement of the ependyma in the bilateral occipital horns and left frontal horn of the ventricular system worrisome for ventriculitis.
IMPRESSION: 1.  Ventriculomegaly, parenchymal hemorrhage, and pneumocephalus have improved. 
 2.  Enhancement of the ependyma of the ventricular system as well as the left caudate lesion.  Ventriculitis is suggested.

## 2006-05-04 IMAGING — CT CT HEAD W/O CM
1 series · 15 of 30 positions shown, 19 images · non-contrast
Comparison: 08/05/05.

CLINICAL DATA: Follow-up hydrocephalus, ventriculostomy during placement.
HEAD CT WITHOUT CONTRAST ? 08/08/05:
TECHNIQUE: Contiguous axial images were obtained from the base of the skull through the vertex according to standard protocol without contrast.

[Series 2: brain · axial · 0.47mm/px · z∈[+112,+238]mm · 15 of 40 slices shown, 19 images]
[im 2/40  brain]
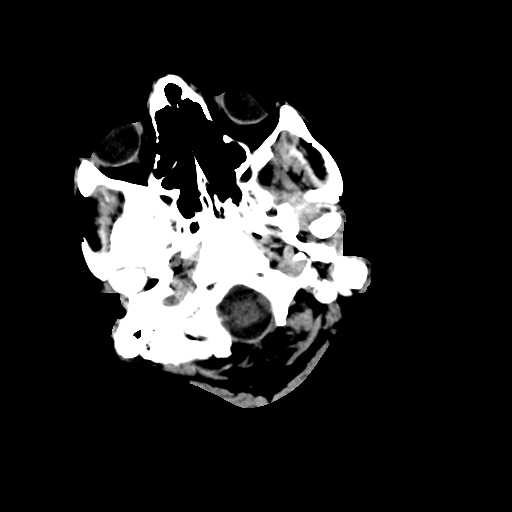
[im 2/40  bone]
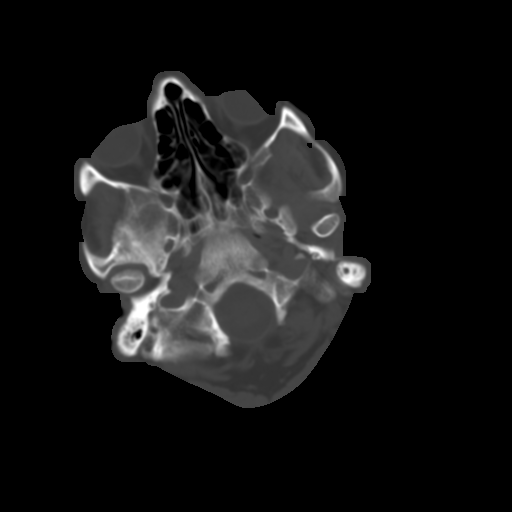
[im 5/40  brain]
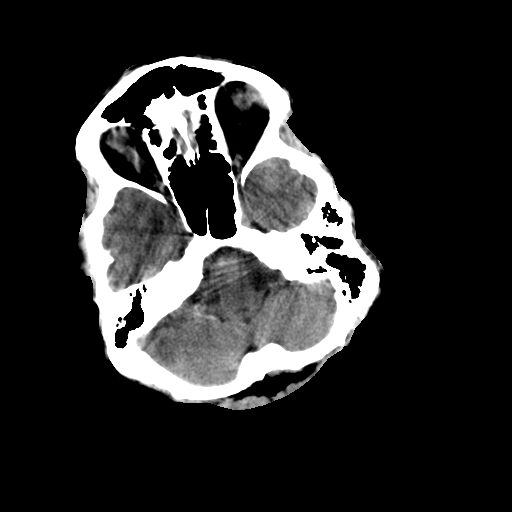
[im 7/40  brain]
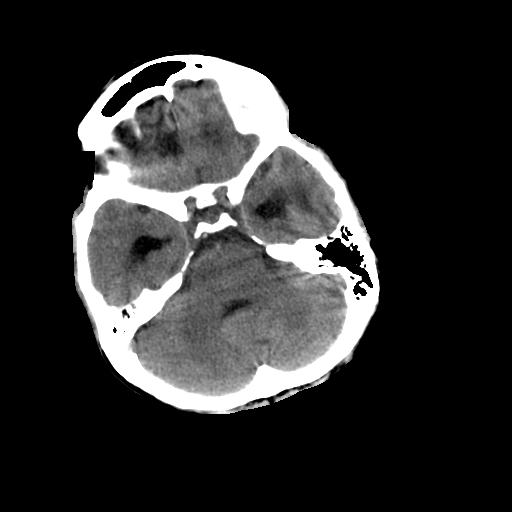
[im 10/40  brain]
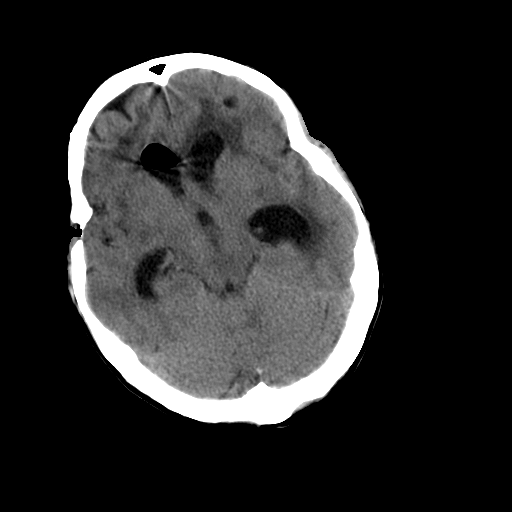
[im 13/40  brain]
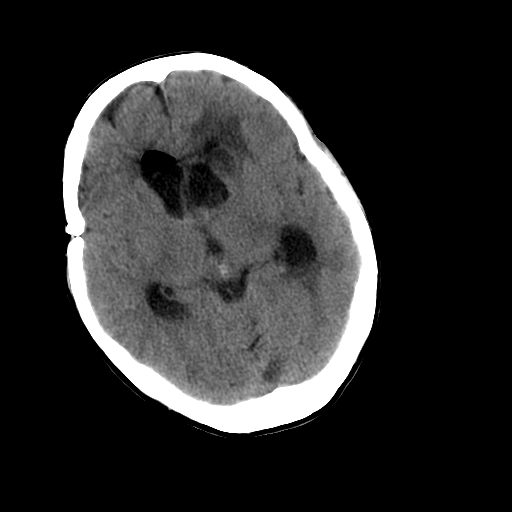
[im 13/40  bone]
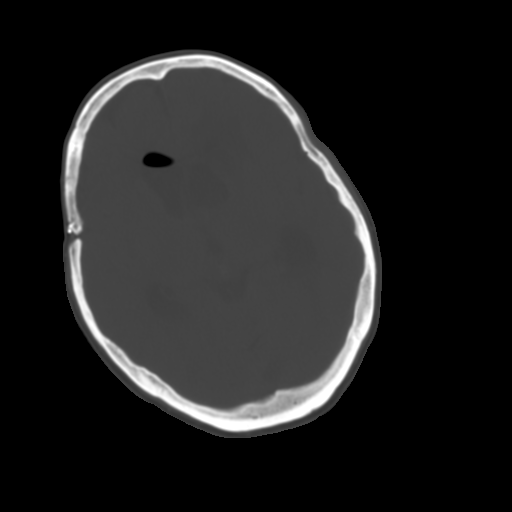
[im 15/40  brain]
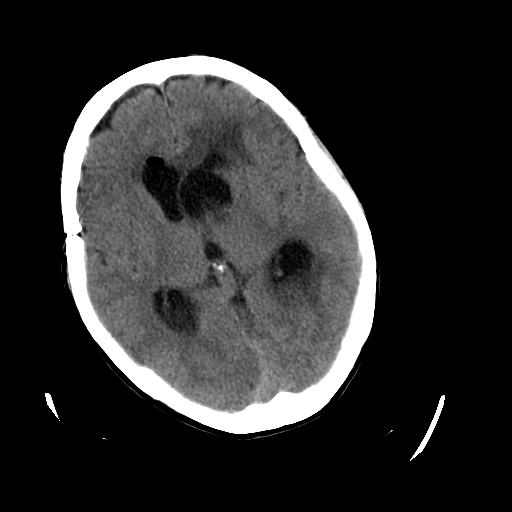
[im 18/40  brain]
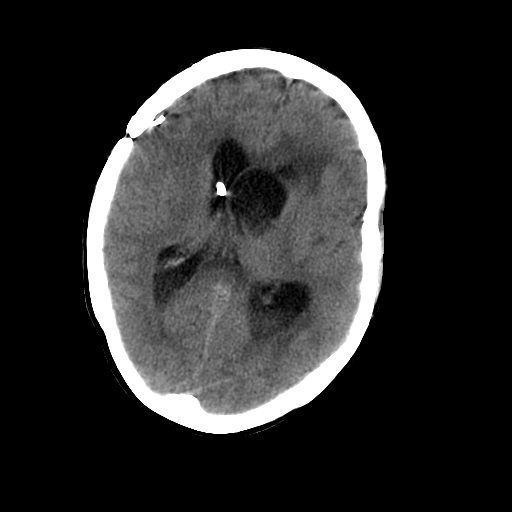
[im 21/40  brain]
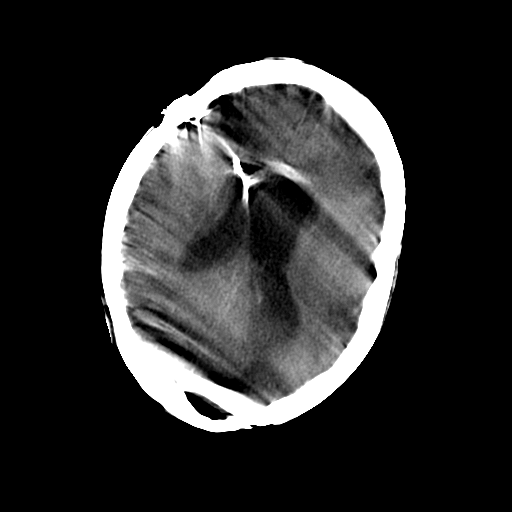
[im 22/40  brain]
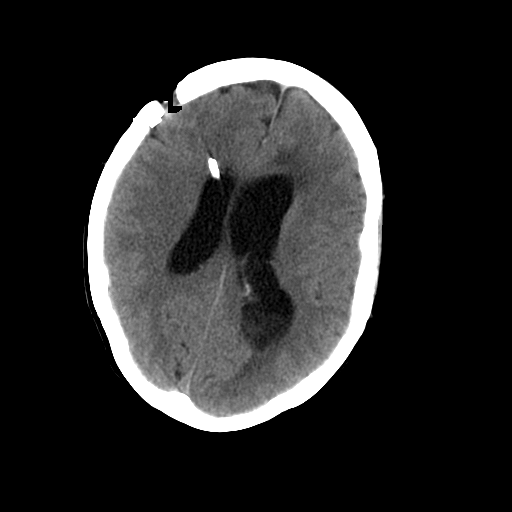
[im 22/40  bone]
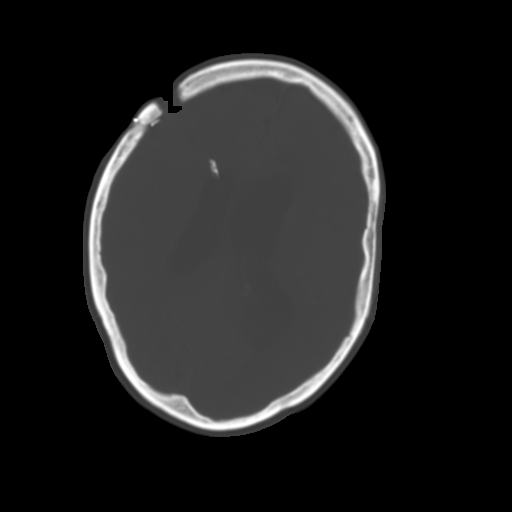
[im 25/40  brain]
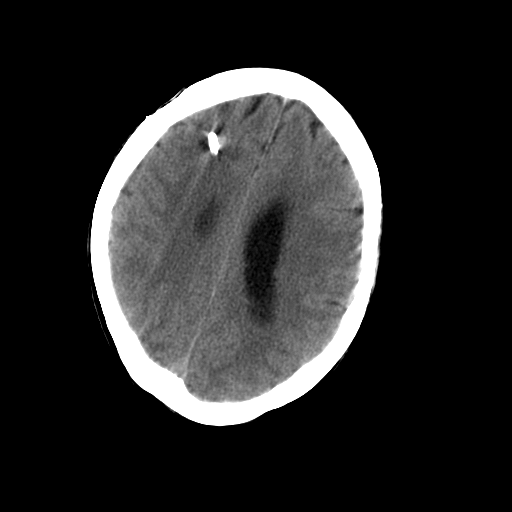
[im 27/40  brain]
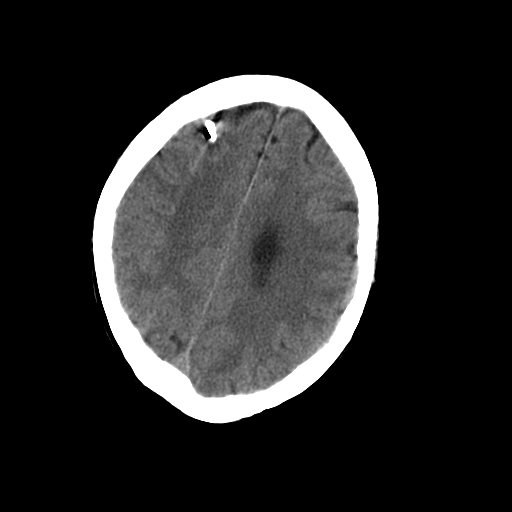
[im 30/40  brain]
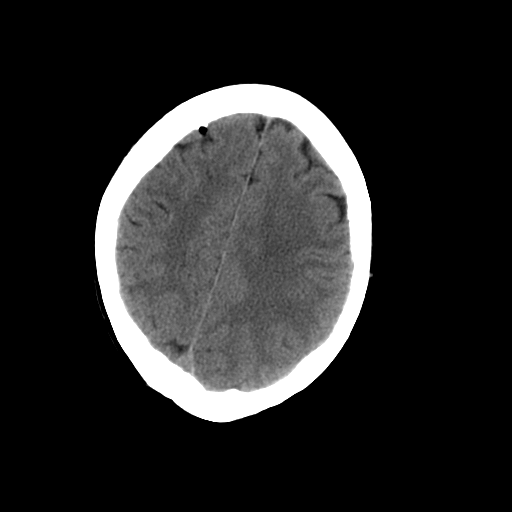
[im 33/40  brain]
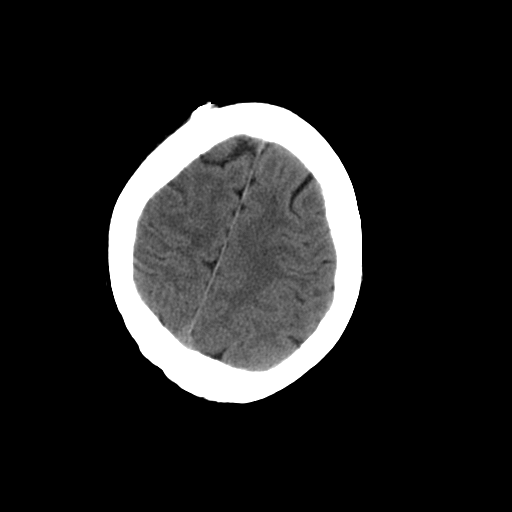
[im 33/40  bone]
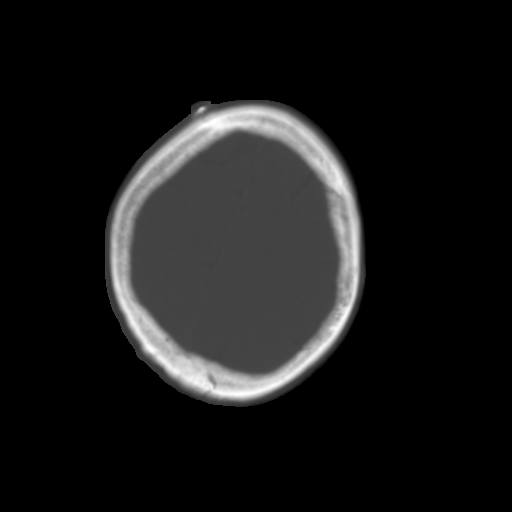
[im 35/40  brain]
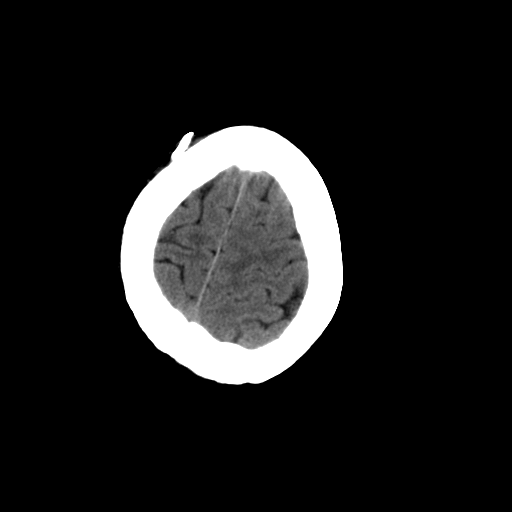
[im 38/40  brain]
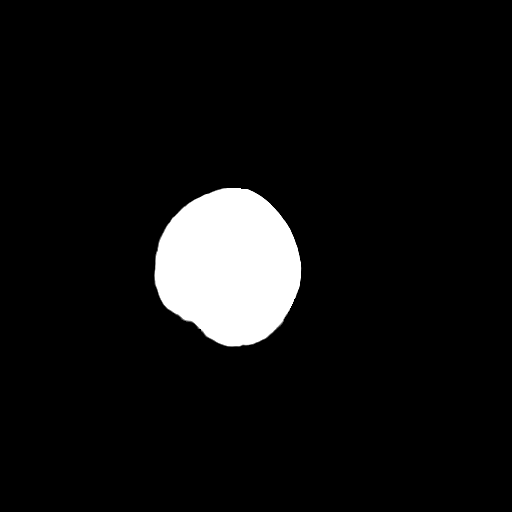

[15 of 30 positions shown; findings below may reference images not displayed]

The patient was moving and several of the images were repeated.  
The right frontal ventricular catheter appears to have been replaced with the tip now in the right lateral ventricle.  There is increase in air in the right frontal horn due to the new catheter placement. There is moderate ventricular enlargement.  The left lateral ventricle is larger than on the prior study.   The right lateral ventricle is enlarged but unchanged. There is no midline shift.   The third and fourth ventricles are nondilated. 
Encephalomalacia is noted in the frontal lobes bilaterally and unchanged.  Low density in the head of the caudate on the left is again noted and slightly smaller. This is noted to be a ring enhancing lesion, probably an abscess on prior studies.
IMPRESSION: Interval replacement of right frontal shunt catheter.  There is mild dilatation of the right lateral ventricle. There is progressive dilatation of the left lateral ventricle since the prior study.   No acute hemorrhage is seen.  Low density remains in the head of the caudate on the left which may be an abscess.

## 2006-05-08 IMAGING — CT CT HEAD W/O CM
1 of 2 series · 13 of 30 positions shown, 17 images · IV contrast (agent unspecified)
Comparison: 08/08/05.

CLINICAL DATA: Intracranial hemorrhage.  Question hydrocephalus. 
HEAD CT WITHOUT CONTRAST:
TECHNIQUE: Contiguous axial images were obtained from the base of the skull through the vertex according to standard protocol without contrast.

[Series 2: brain · axial · 0.47mm/px · z∈[+134,+259]mm · 13 of 32 slices shown, 17 images]
[im 3/32  brain]
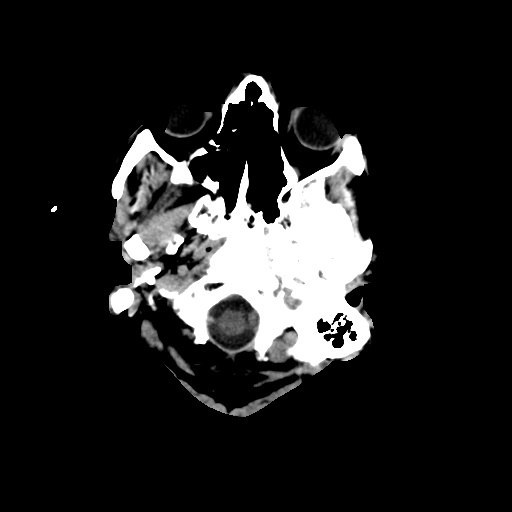
[im 3/32  bone]
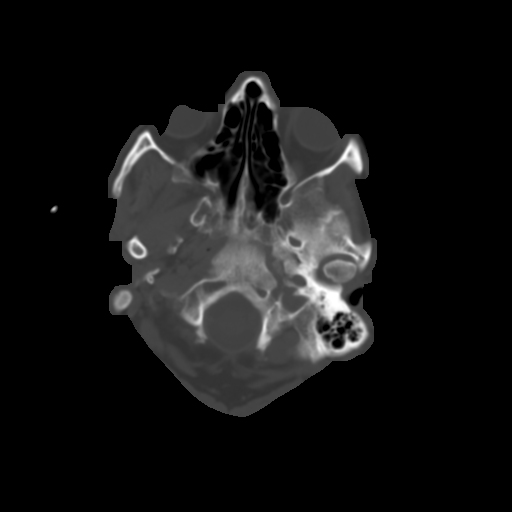
[im 5/32  brain]
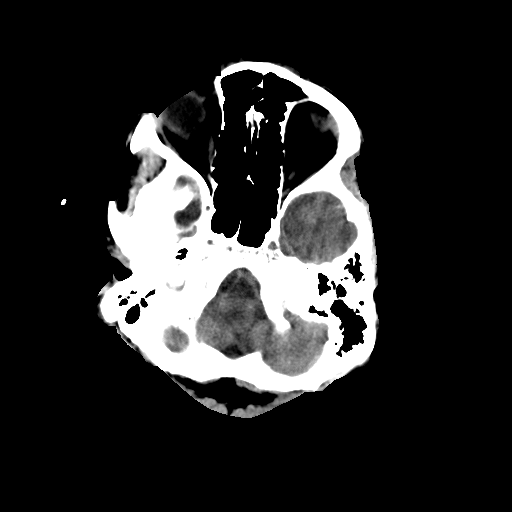
[im 7/32  brain]
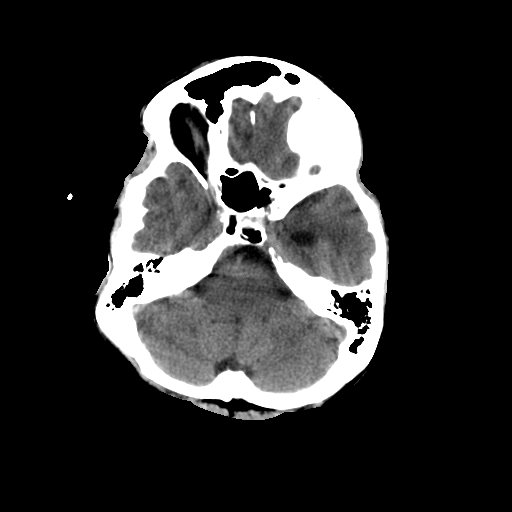
[im 9/32  brain]
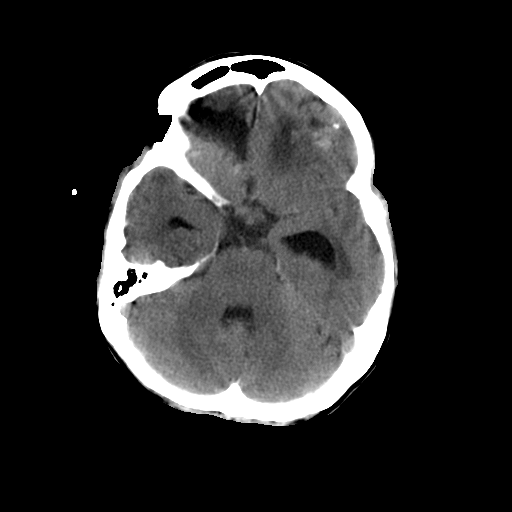
[im 12/32  brain]
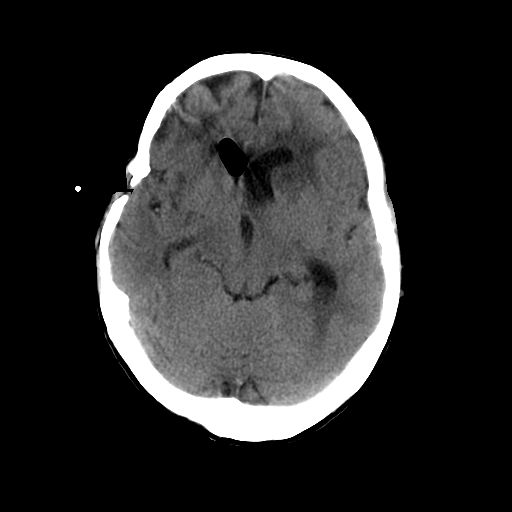
[im 12/32  bone]
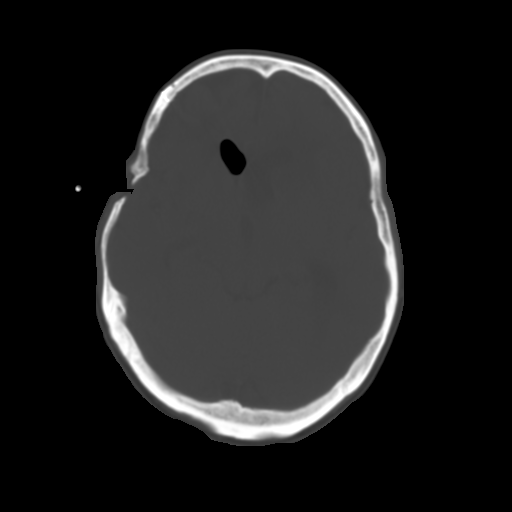
[im 14/32  brain]
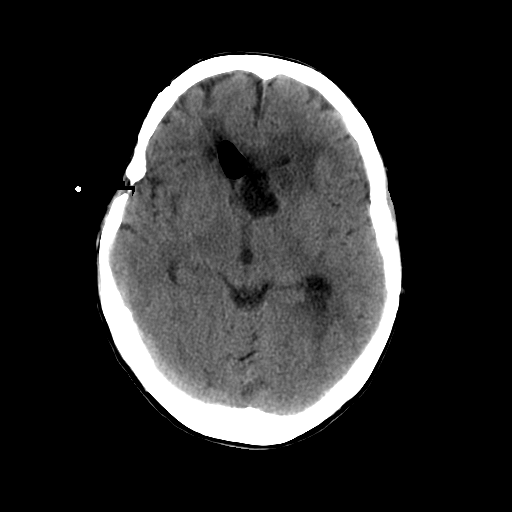
[im 16/32  brain]
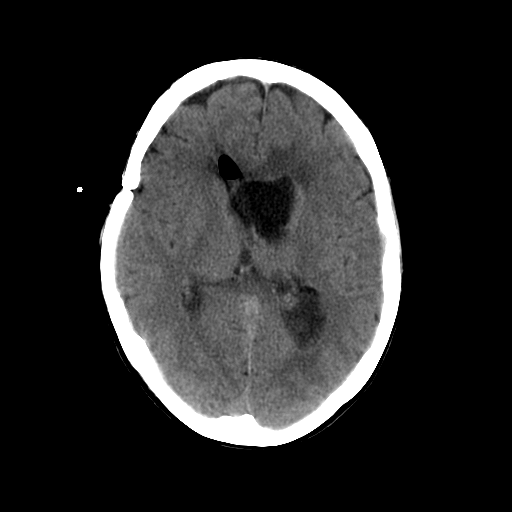
[im 18/32  brain]
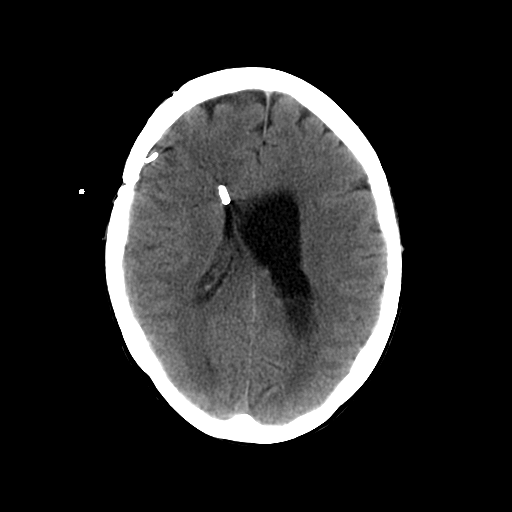
[im 20/32  brain]
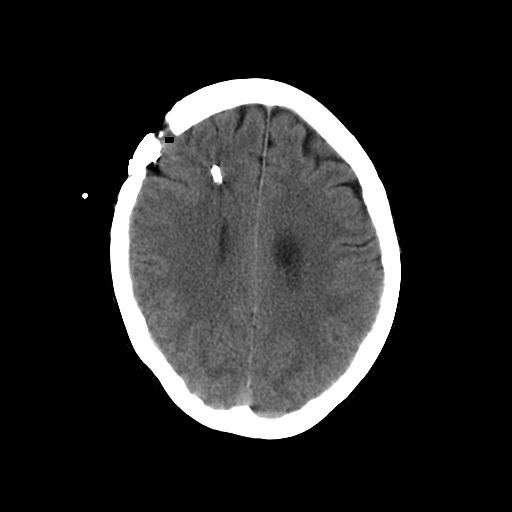
[im 20/32  bone]
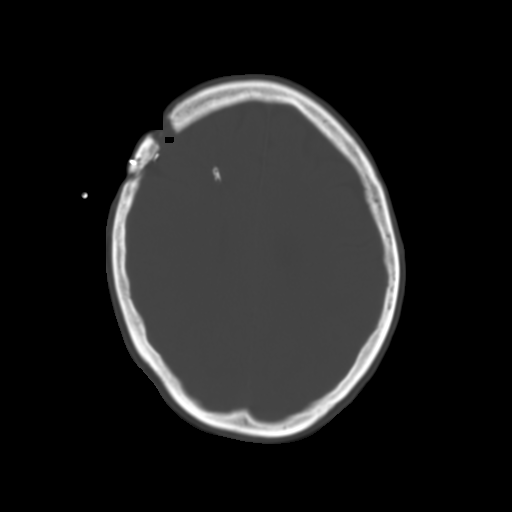
[im 23/32  brain]
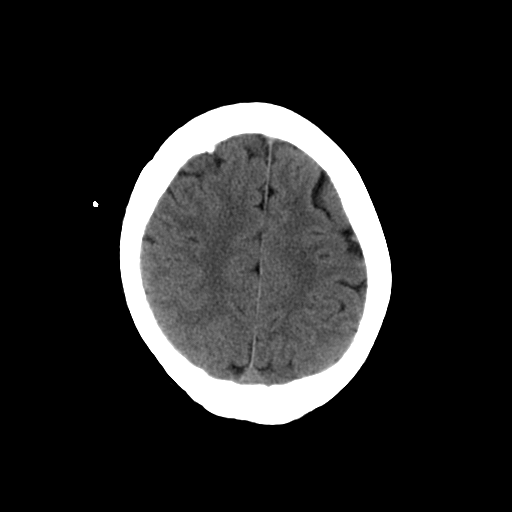
[im 25/32  brain]
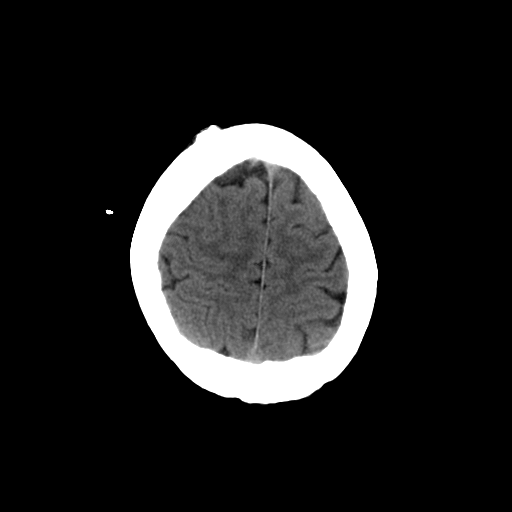
[im 27/32  brain]
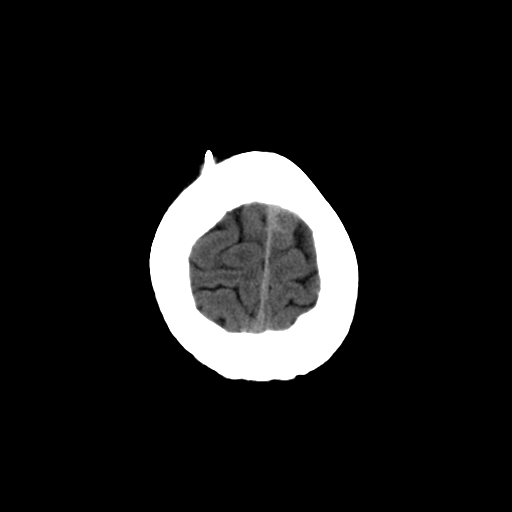
[im 29/32  brain]
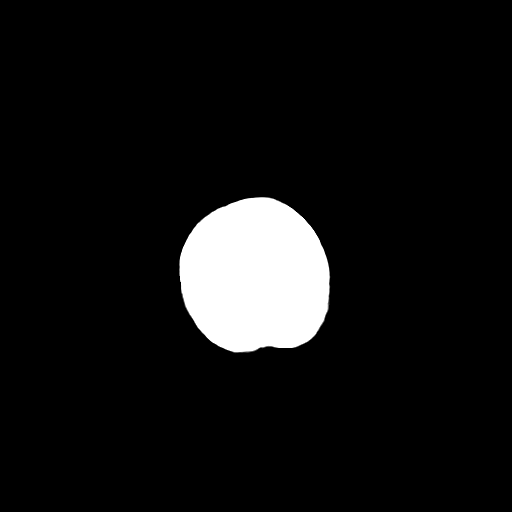
[im 29/32  bone]
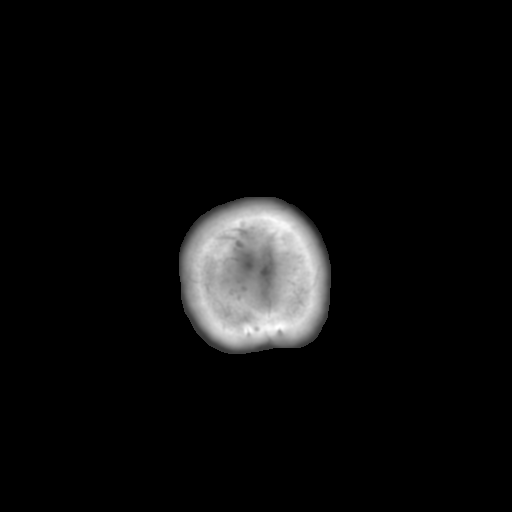

[13 of 30 positions shown; findings below may reference images not displayed]

FINDINGS: Again seen is a right frontal approach ventriculostomy shunt catheter. There has been interval decrease in dilatation of the right lateral ventricle.   Left lateral ventricle remains dilated without marked change. Air is again seen in the frontal horn of the right lateral ventricle.  There is complex appearing fluid in the system bilaterally.  The previously described low attenuation lesion in the left caudate head is not as well seen today.  Encephalomalacic change in the frontal lobes persists.   Postoperative change of right frontal craniectomy again noted.  No subdural blood is seen.
IMPRESSION: 1.  Continued decompression of the right ventricular system with continued dilatation on the left. 
2.  Complex appearing fluid is seen within the ventricular system bilaterally. 
3.  Low attenuation focus in the left caudate head which likely represents an abscess is not as well seen today.

## 2006-05-11 IMAGING — CT CT HEAD W/O CM
1 of 2 series · 15 of 30 positions shown, 19 images · non-contrast
Comparison: none

CLINICAL DATA: Intracranial hemorrhage.  entriculostomy shunt catheter. 
HEAD CT WITHOUT CONTRAST - 08/15/05:
TECHNIQUE: Contiguous axial images were obtained from the base of the skull through the vertex according to standard protocol without contrast.

[Series 2: headseq 4.8 h45s · axial · 0.41mm/px · z∈[-176,-36]mm · 15 of 33 slices shown, 19 images]
[im 2/33  brain]
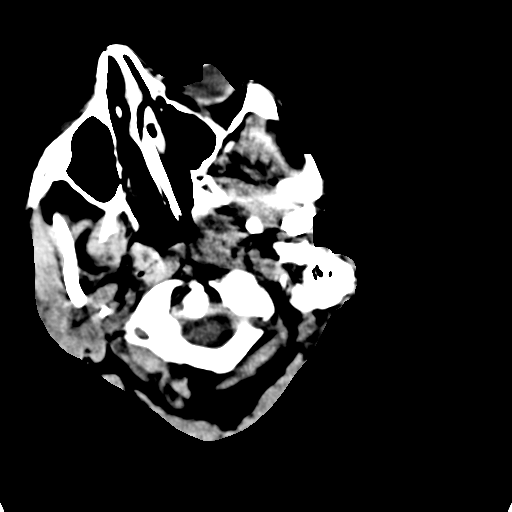
[im 2/33  bone]
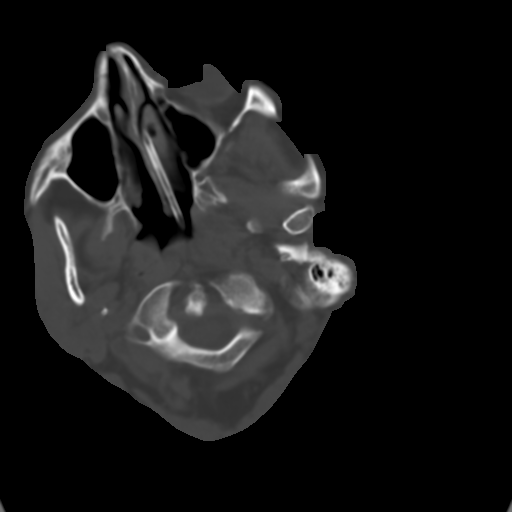
[im 5/33  brain]
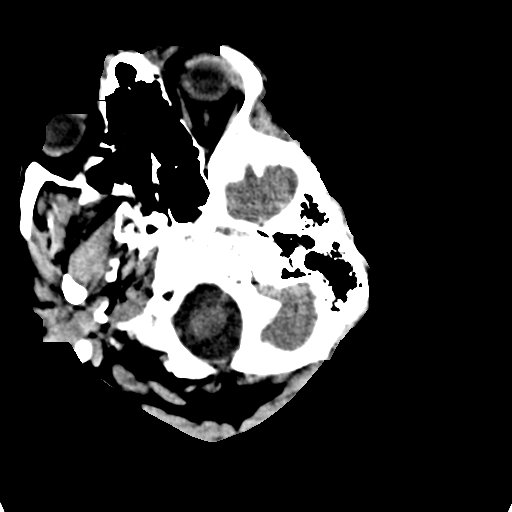
[im 6/33  brain]
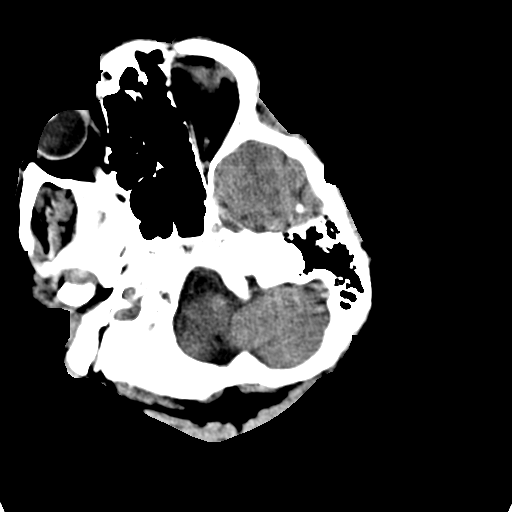
[im 9/33  brain]
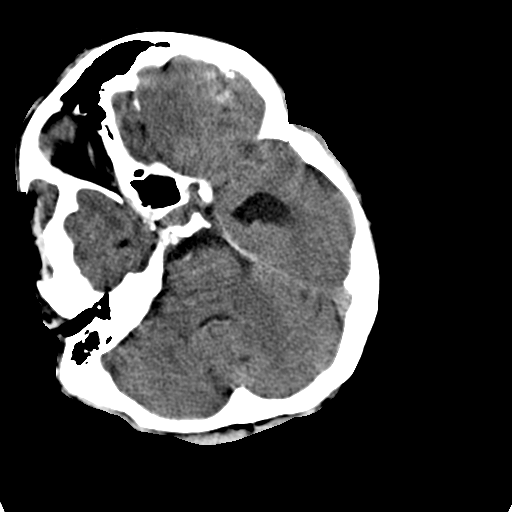
[im 10/33  brain]
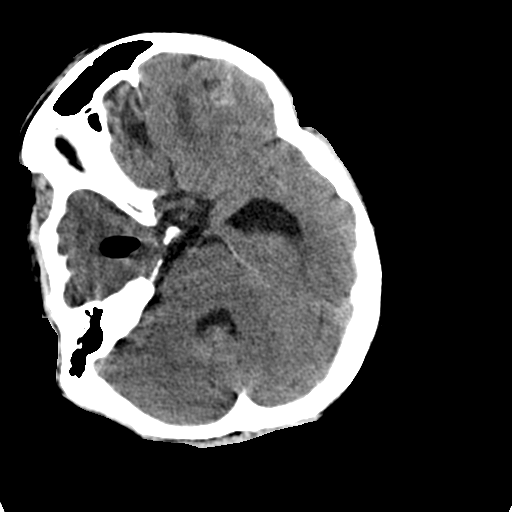
[im 10/33  bone]
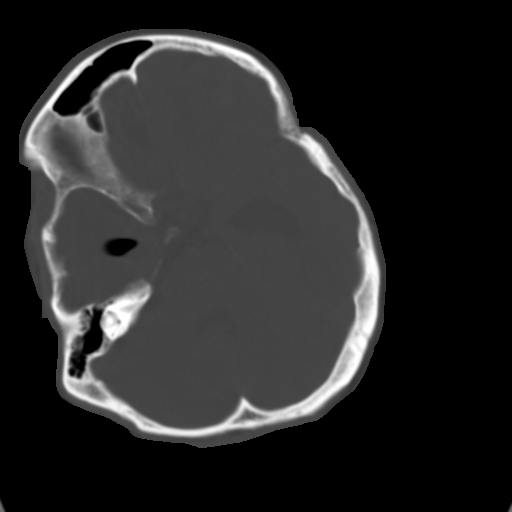
[im 13/33  brain]
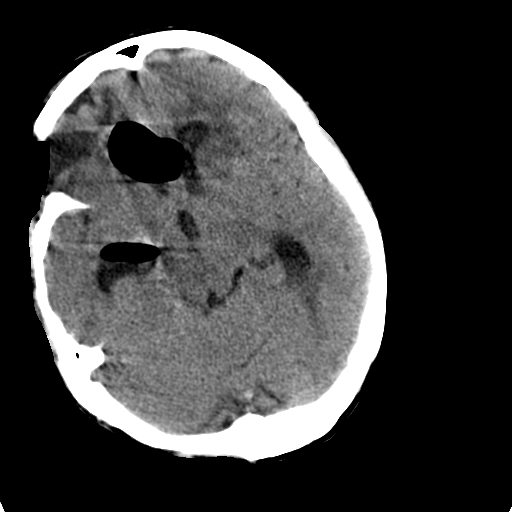
[im 14/33  brain]
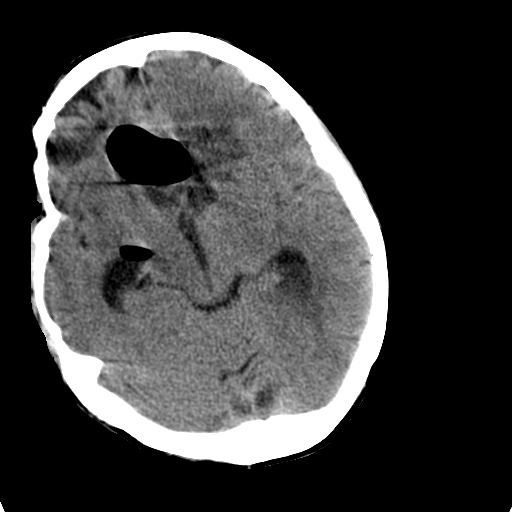
[im 17/33  brain]
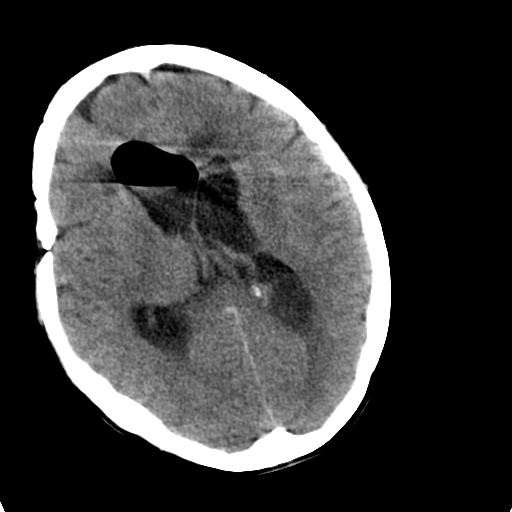
[im 19/33  brain]
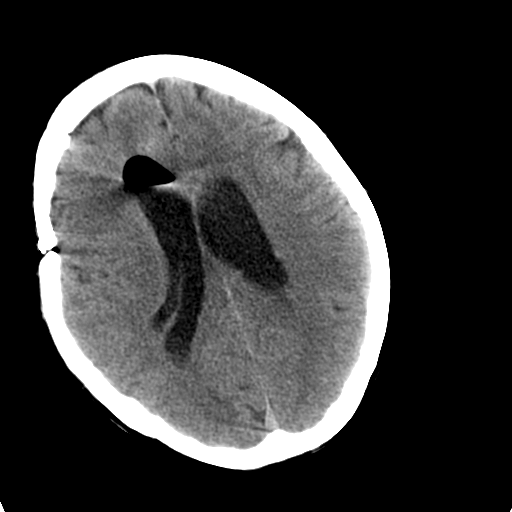
[im 19/33  bone]
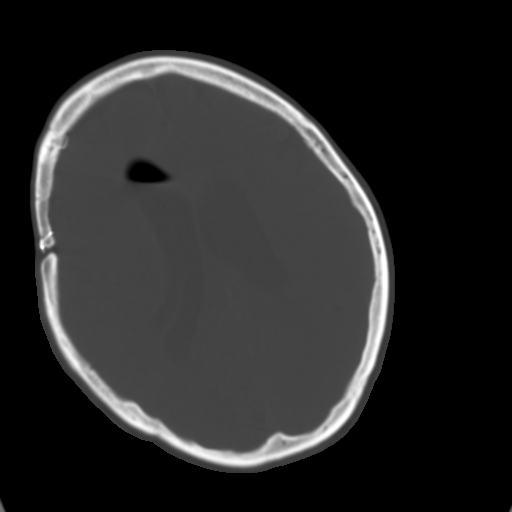
[im 20/33  brain]
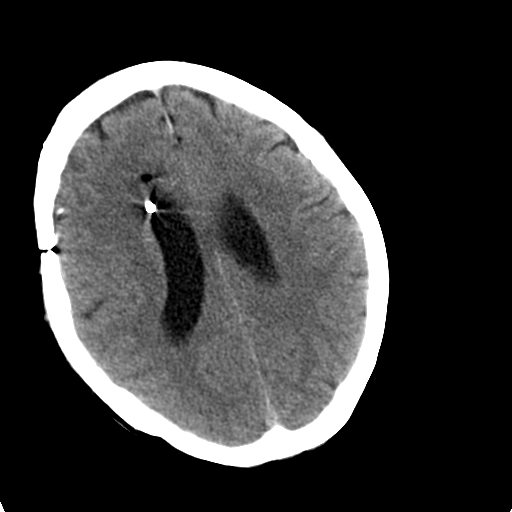
[im 23/33  brain]
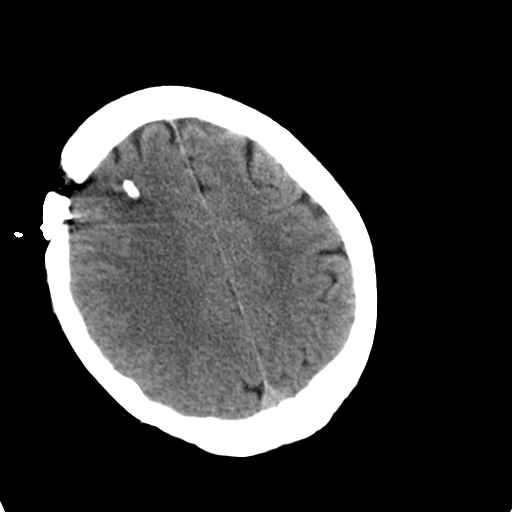
[im 24/33  brain]
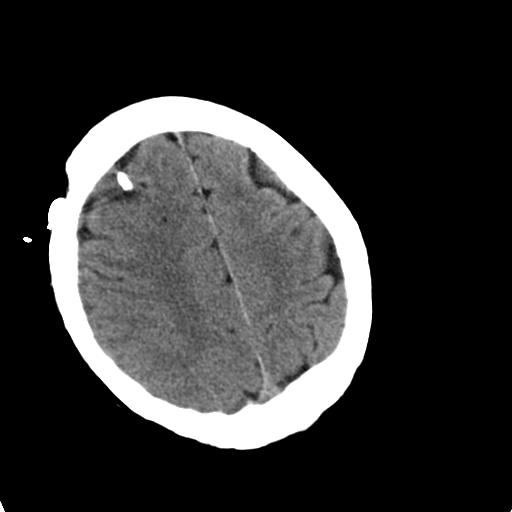
[im 27/33  brain]
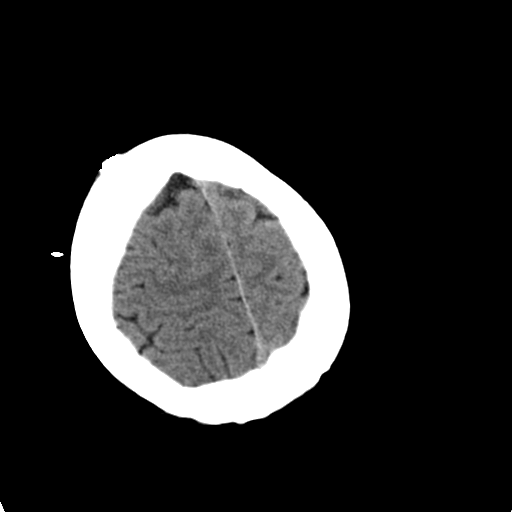
[im 27/33  bone]
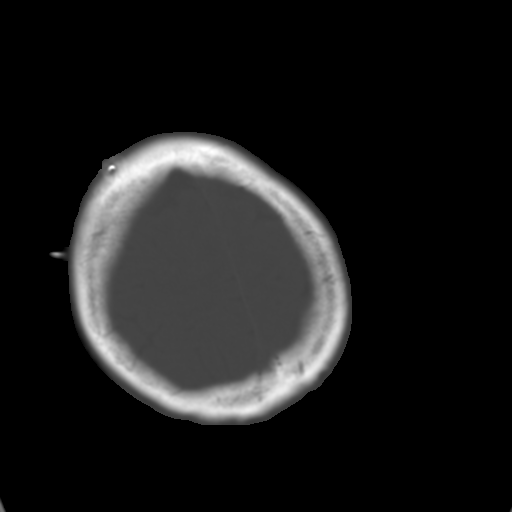
[im 28/33  brain]
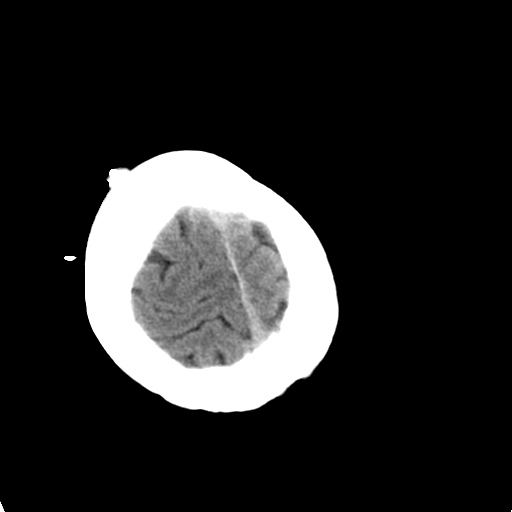
[im 31/33  brain]
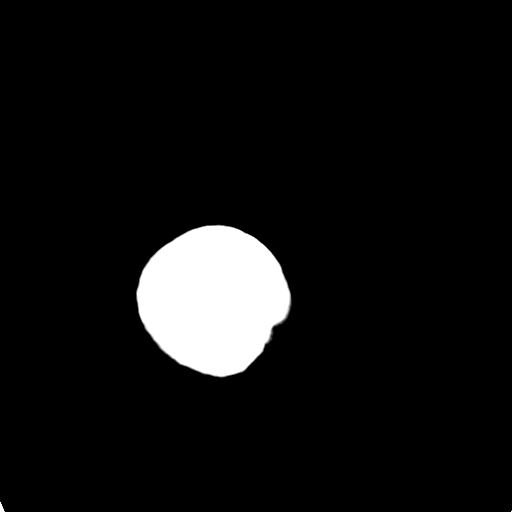

[15 of 30 positions shown; findings below may reference images not displayed]

FINDINGS: There has been interval worsening of hydrocephalus with marked enlargement of the right temporal tip which had measured approximately 0.4 cm from front to back and now measures 1.5 cm.  Pneumocephalus has increased.   The tip of the patient?s right ventriculostomy shunt catheter appears to lie just within the frontal horn of the right lateral ventricle.   It appears to have backed out slightly. This may be due to portion hydrocephalus.  Low attenuation of left caudate lobe is again noted as is encephalomalacic change in the right frontal lobe.  No new hemorrhage with complex stool within the ventricular system again seen.
IMPRESSION: Status-post clamping of ventriculostomy shunt catheter with worsened hydrocephalus. The tip of the catheter now lies just within the frontal horn of the right lateral ventricle. The findings were called to the patient?s nurse immediately on interpretation on 08/15/05 at [DATE] a.m.

## 2006-05-14 IMAGING — CT CT HEAD W/O CM
1 series · 15 of 30 positions shown, 19 images · IV contrast (agent unspecified)
Comparison: 08/15/05.

CLINICAL DATA: Followup of intracranial hemorrhage. 
HEAD CT WITHOUT CONTRAST:
TECHNIQUE: Contiguous axial images were obtained from the base of the skull through the vertex according to standard protocol without contrast.

[Series 2: routinehead 4.8 h45s · axial · 0.39mm/px · z∈[-92,+43]mm · 15 of 33 slices shown, 19 images]
[im 2/33  brain]
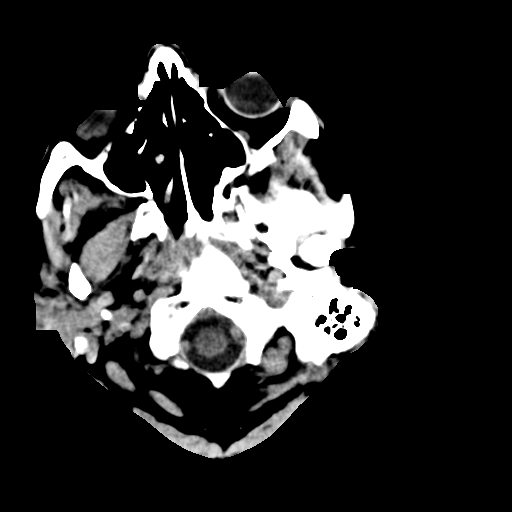
[im 2/33  bone]
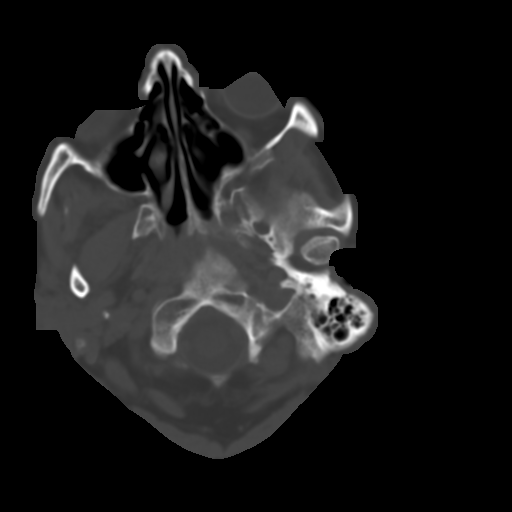
[im 4/33  brain]
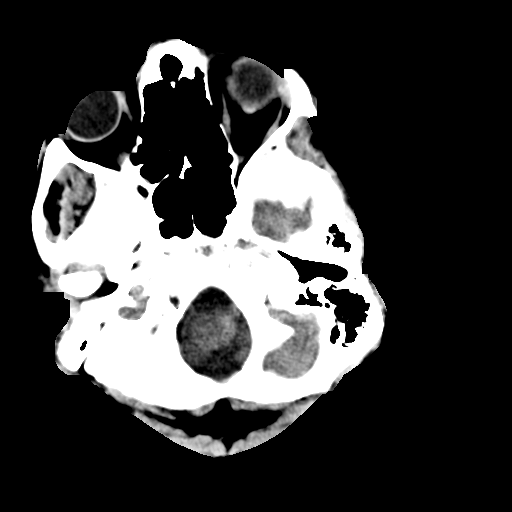
[im 6/33  brain]
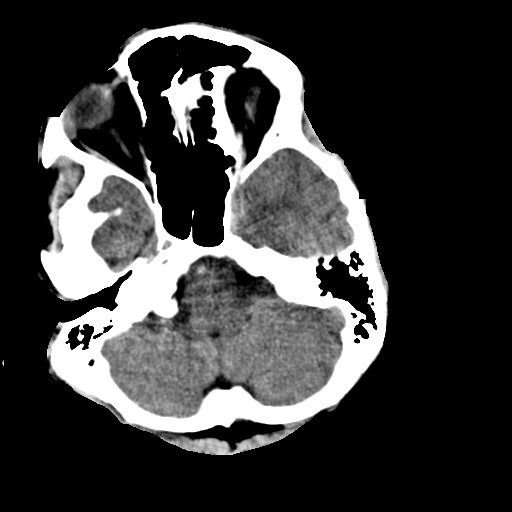
[im 8/33  brain]
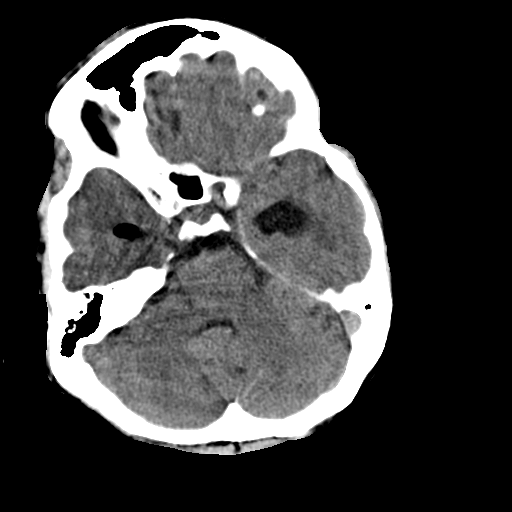
[im 10/33  brain]
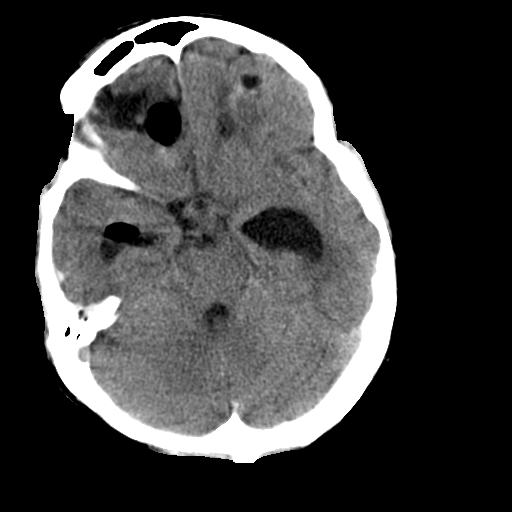
[im 10/33  bone]
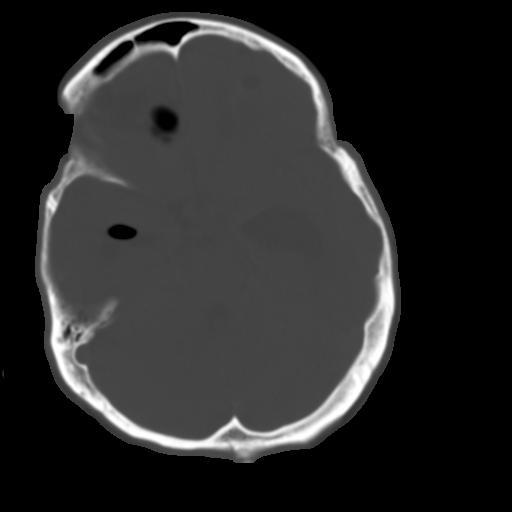
[im 13/33  brain]
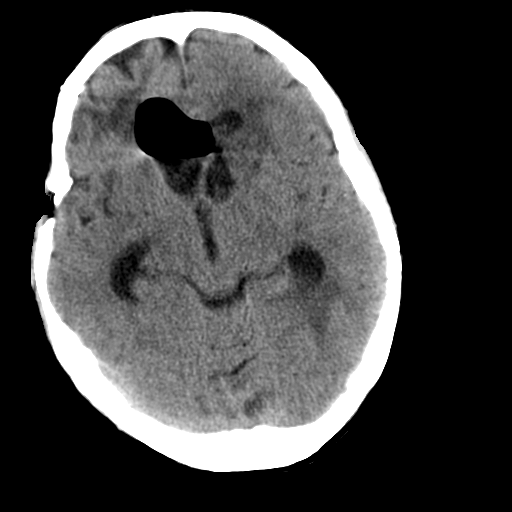
[im 15/33  brain]
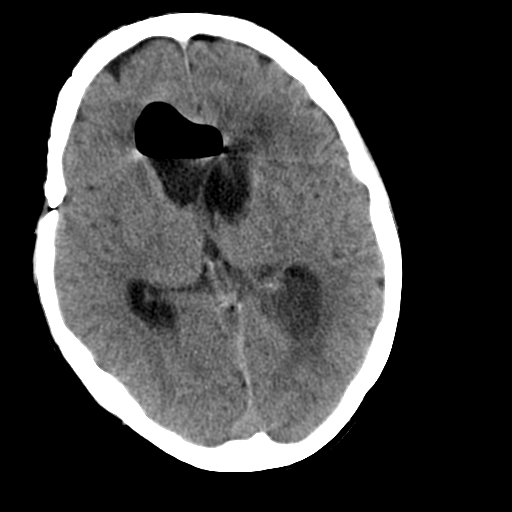
[im 17/33  brain]
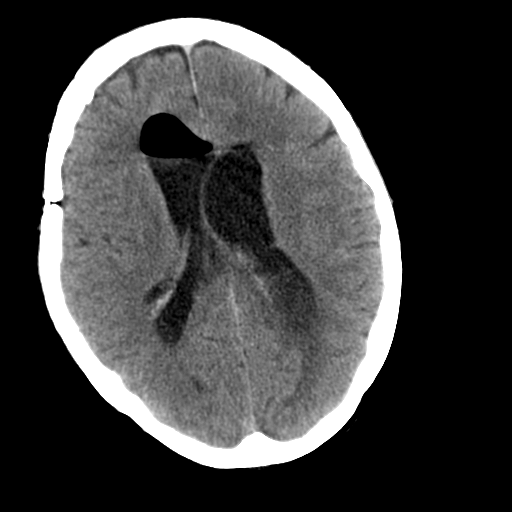
[im 18/33  brain]
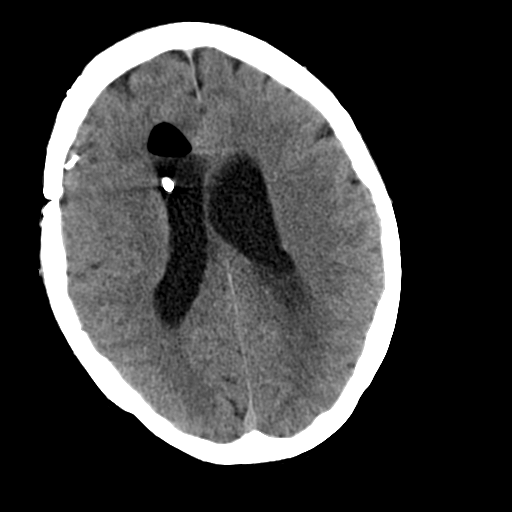
[im 18/33  bone]
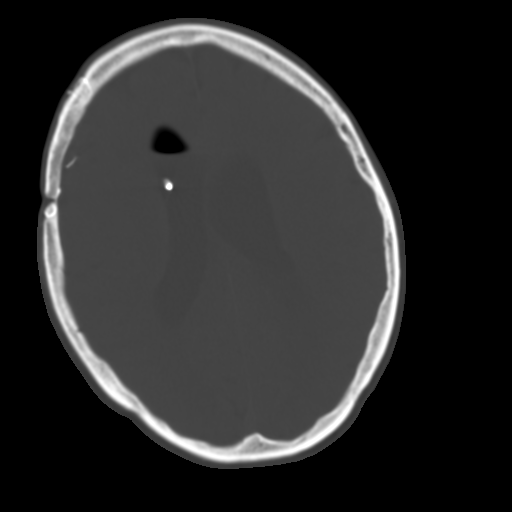
[im 20/33  brain]
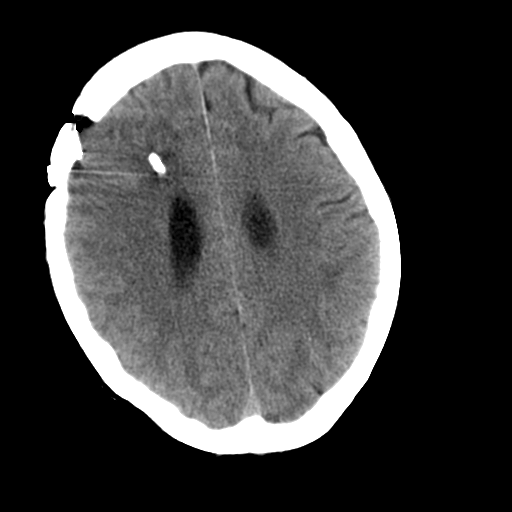
[im 23/33  brain]
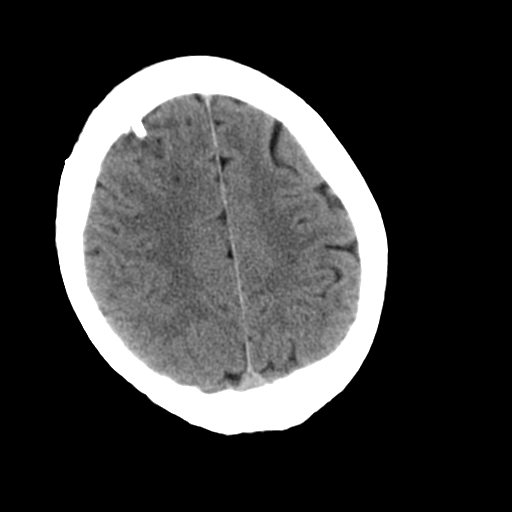
[im 25/33  brain]
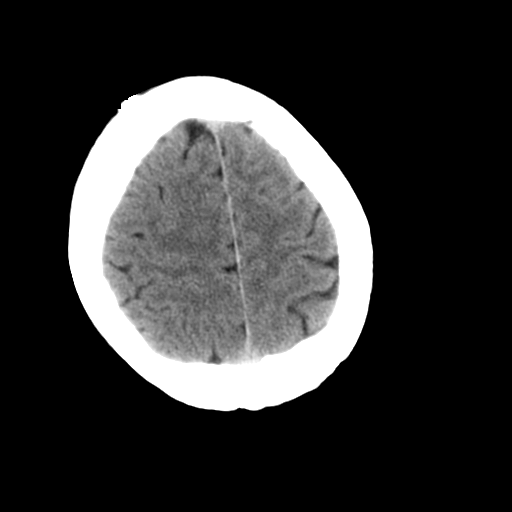
[im 27/33  brain]
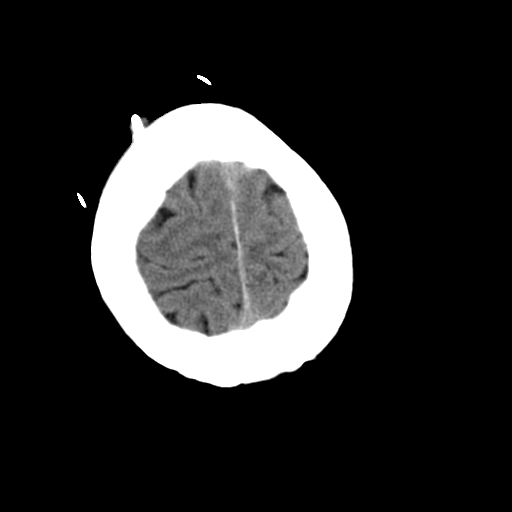
[im 27/33  bone]
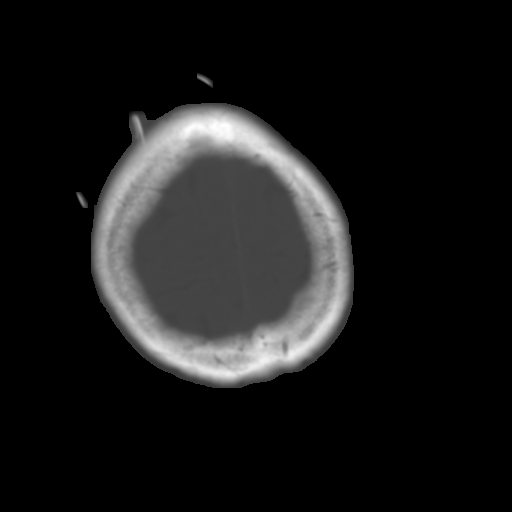
[im 29/33  brain]
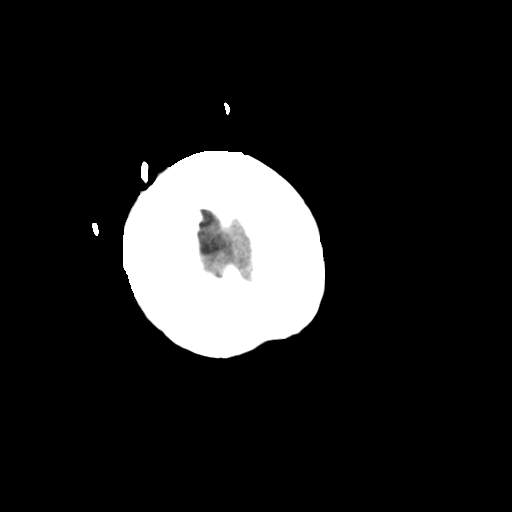
[im 31/33  brain]
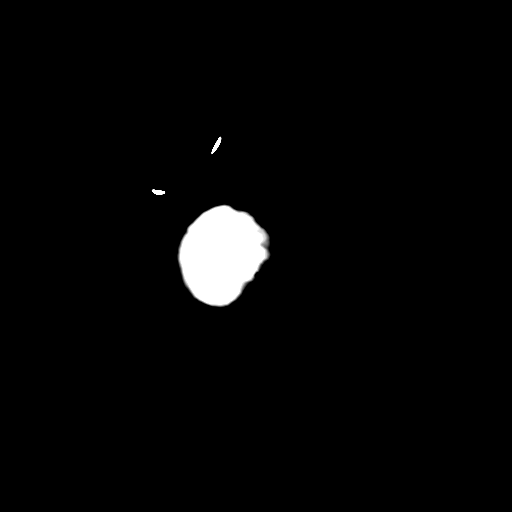

[15 of 30 positions shown; findings below may reference images not displayed]

FINDINGS: Bone windows demonstrate postsurgical changes of right frontal craniotomy.  The right-sided VP shunt catheter is now positioned more centrally in the anterior horn of the right lateral ventricle.  
The extent of hydrocephalus is similar to on the prior exam.   There is slight decreased pneumocephalus with primary change in the temporal horn of the right lateral ventricle. 
Complex material is again seen within bilateral lateral ventricles.  The low density lesion in the left caudate head is less apparent on the current exam.  There is an area of encephalomalacia and dystrophic calcification in the left frontal lobe as well as encephalomalacia in the right frontal lobe.  There is no evidence of acute hemorrhage or infarct.
IMPRESSION: No significant change in appearance of the brain.  The extent of hydrocephalus  is similar with a ventriculostomy shunt in the anterior horn of the right lateral ventricle.  Complex material is again seen within the ventricular system.

## 2006-05-16 IMAGING — CT CT HEAD W/O CM
1 of 2 series · 13 of 30 positions shown, 17 images · IV contrast (agent unspecified)
Comparison: 08/18/05.

CLINICAL DATA: Interval placement of ventriculostomy tube. Follow-up [facility].
 HEAD CT WITHOUT CONTRAST:
TECHNIQUE: Contiguous axial images were obtained from the base of the skull through the vertex according to standard protocol without contrast.

[Series 2: brain · axial · 0.47mm/px · z∈[+75,+200]mm · 13 of 32 slices shown, 17 images]
[im 3/32  brain]
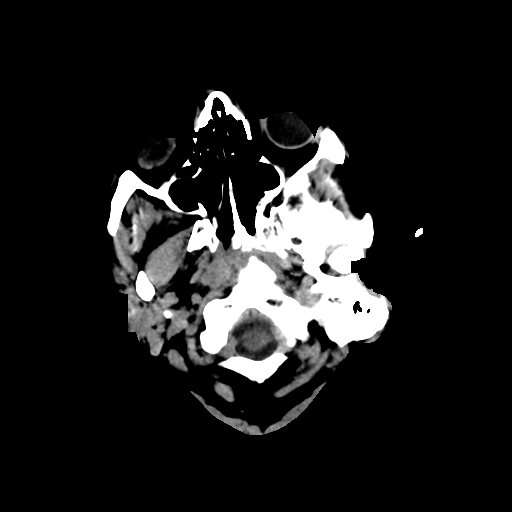
[im 3/32  bone]
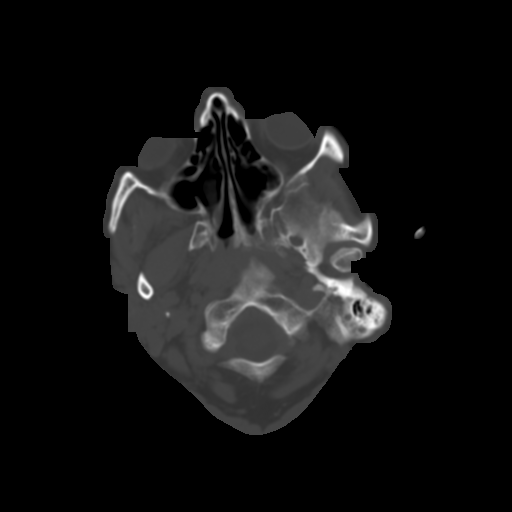
[im 5/32  brain]
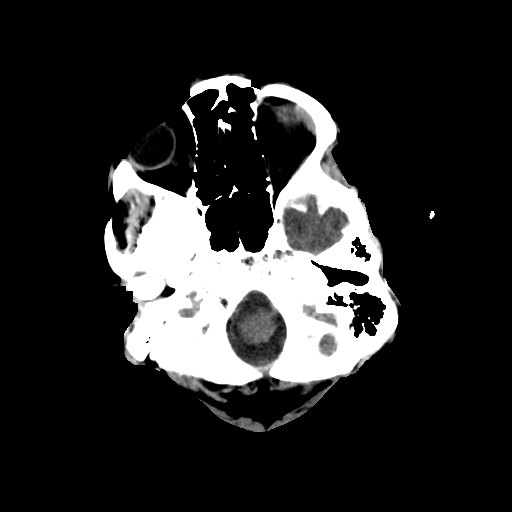
[im 7/32  brain]
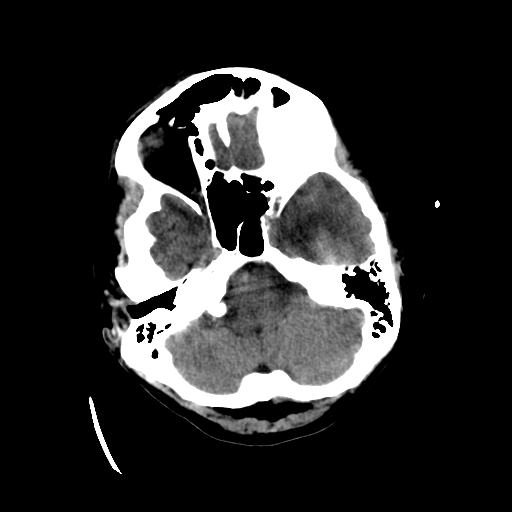
[im 9/32  brain]
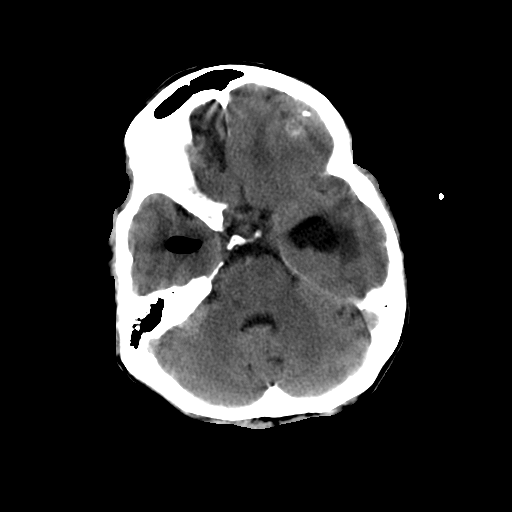
[im 12/32  brain]
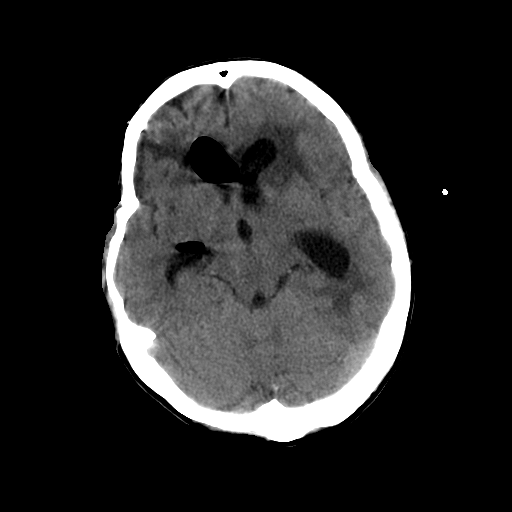
[im 12/32  bone]
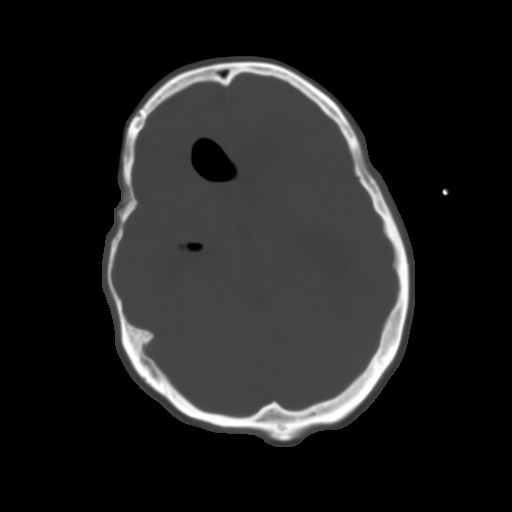
[im 14/32  brain]
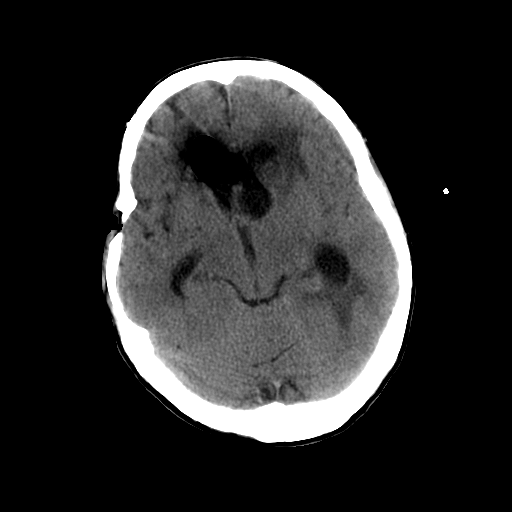
[im 16/32  brain]
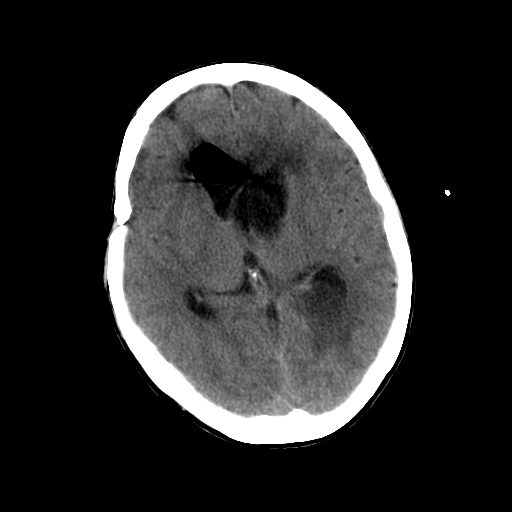
[im 18/32  brain]
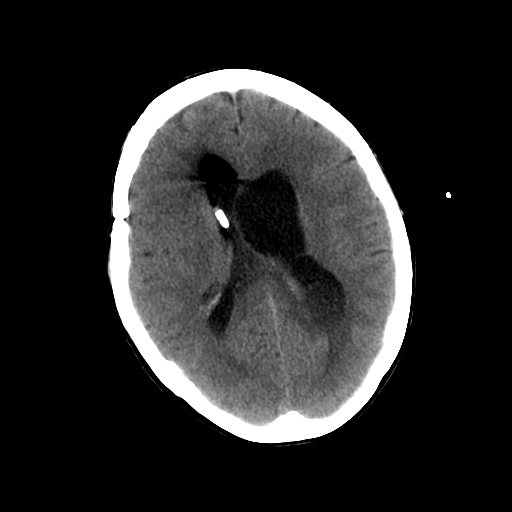
[im 20/32  brain]
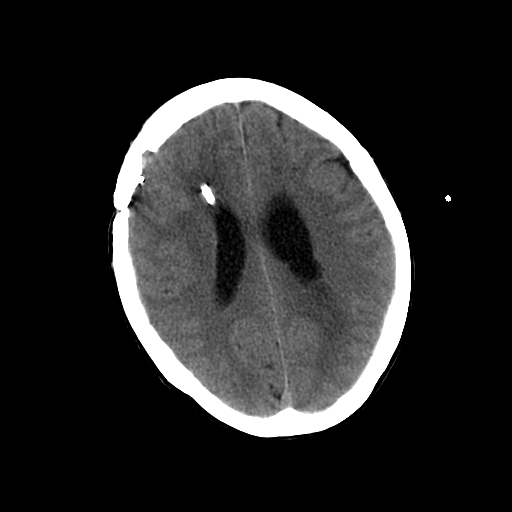
[im 20/32  bone]
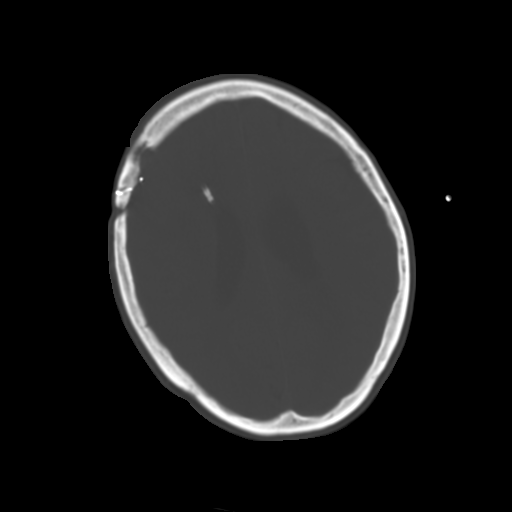
[im 23/32  brain]
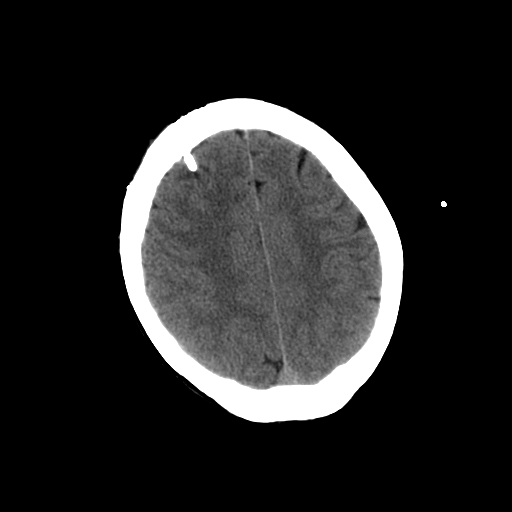
[im 25/32  brain]
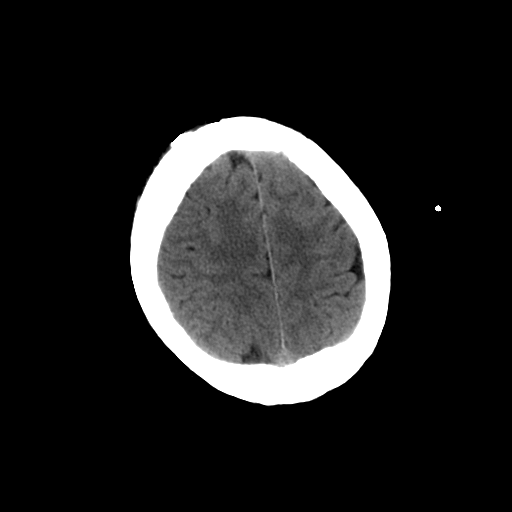
[im 27/32  brain]
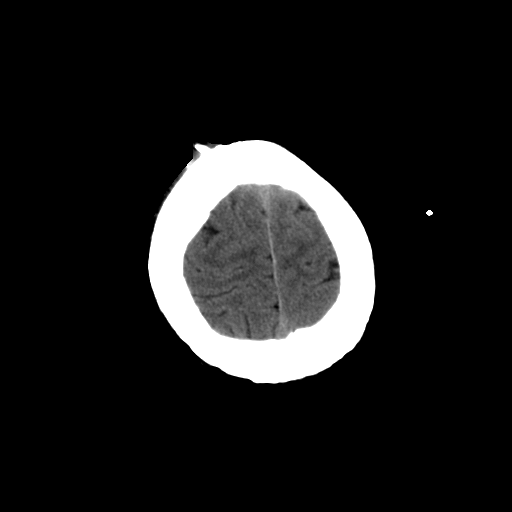
[im 29/32  brain]
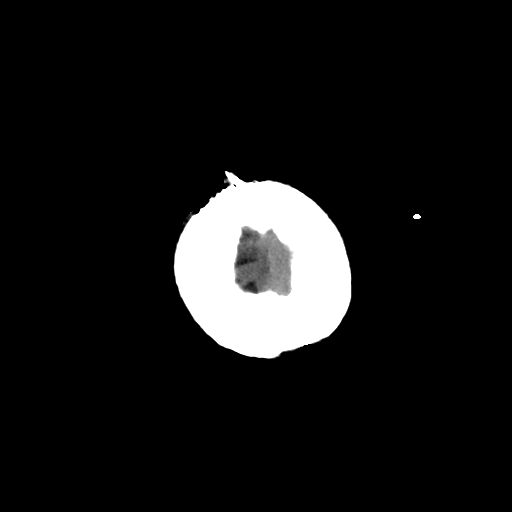
[im 29/32  bone]
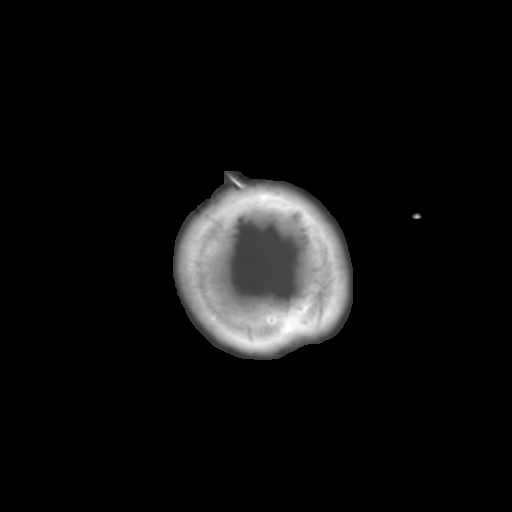

[13 of 30 positions shown; findings below may reference images not displayed]

FINDINGS: There appears to be manipulation of the right frontal ventriculostomy tube.  The tip is in the region of the right ventricle.  The ventricular volumes have increased in the interval. Pneumocephalus persists. Parenchymal hemorrhage within the low left frontal lobe is unchanged.    No new areas of hemorrhage are identified.  Complex material is again noted within the ventricular system.
IMPRESSION: Interval manipulation of right frontal ventriculostomy tube. The ventricular volumes are slightly increased in size.  Complex material is again seen within the ventricular system.

## 2006-05-17 IMAGING — CR DG ABD PORTABLE 1V
1 series · 1 of 1 positions shown · non-contrast
Comparison: 07/30/05.

CLINICAL DATA: Intracranial hemorrhage, Panda feeding tube insertion.
 PORTABLE ABDOMEN ? 1 VIEW ? 08/23/05:

[view not recorded]
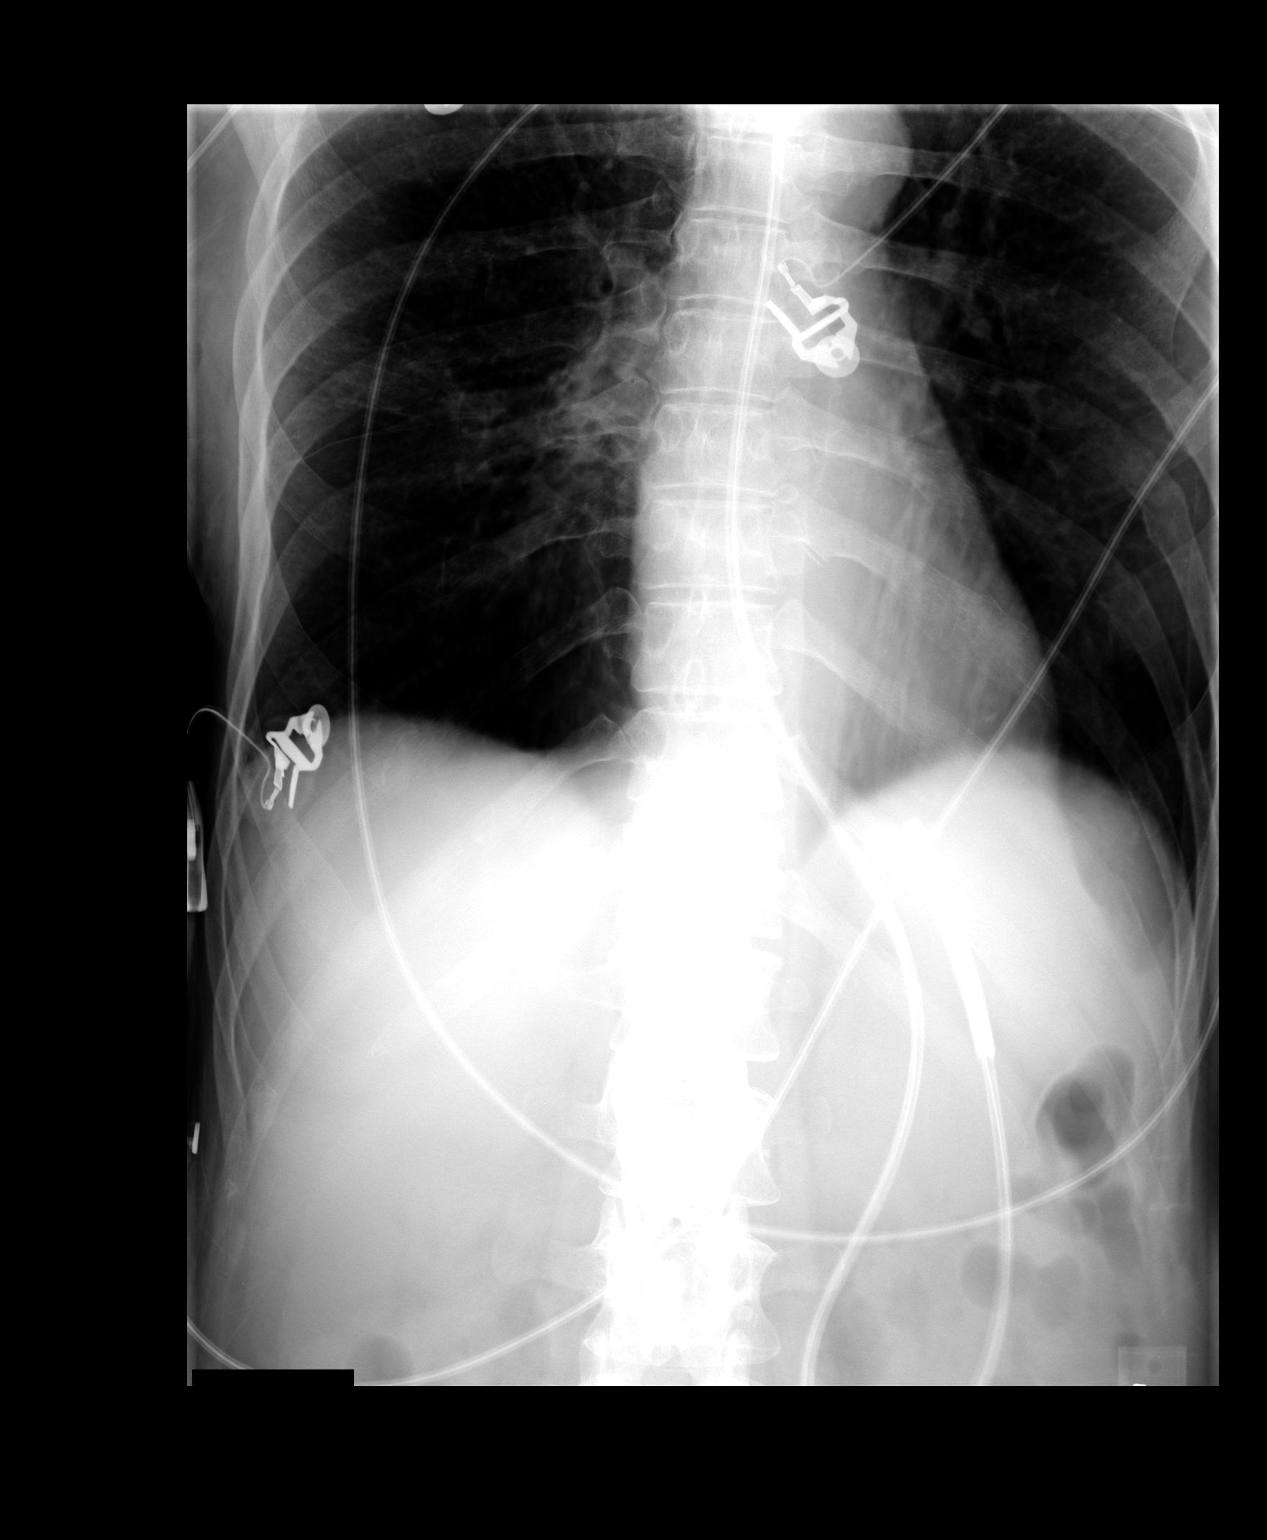

[1 of 1 positions shown; findings below may reference images not displayed]

FINDINGS: Panda feeding tube enters the stomach and is folded in the stomach body with the tip redirected to the stomach fundus.
IMPRESSION: Feeding tube folded in the stomach.  Tip in the fundus.

## 2006-05-19 IMAGING — CT CT HEAD W/O CM
1 of 2 series · 13 of 30 positions shown, 17 images · IV contrast (agent unspecified)
Comparison: 08/20/05.

CLINICAL DATA: Intracranial hemorrhage follow-up.
 HEAD CT WITHOUT CONTRAST:
TECHNIQUE: Contiguous axial images were obtained from the base of the skull through the vertex according to standard protocol without contrast.

[Series 2: brain · axial · 0.47mm/px · z∈[+124,+254]mm · 13 of 40 slices shown, 17 images]
[im 3/40  brain]
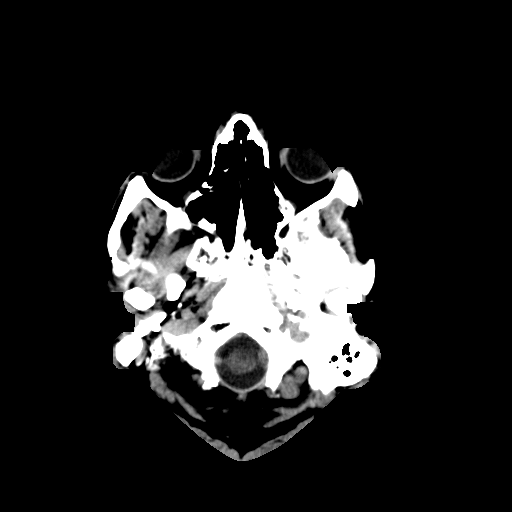
[im 3/40  bone]
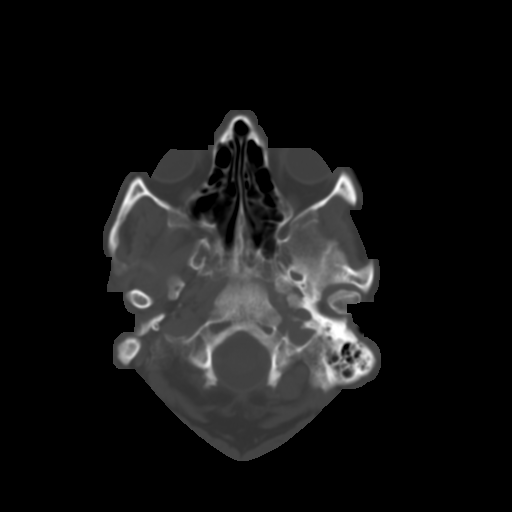
[im 6/40  brain]
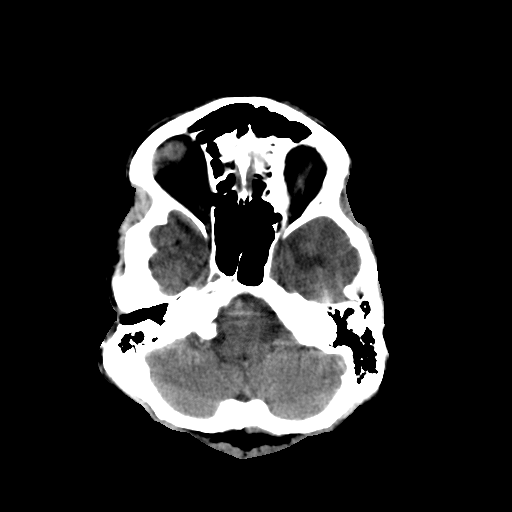
[im 9/40  brain]
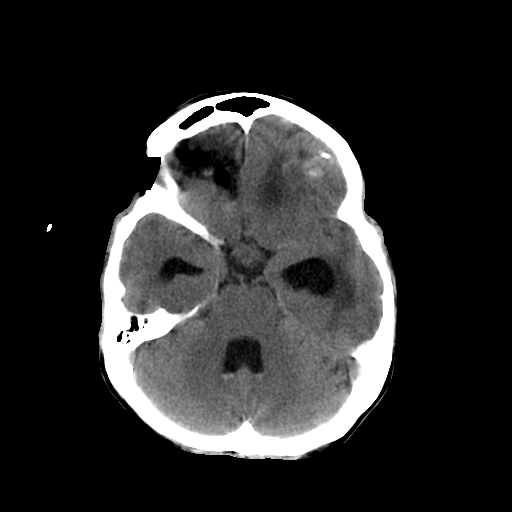
[im 12/40  brain]
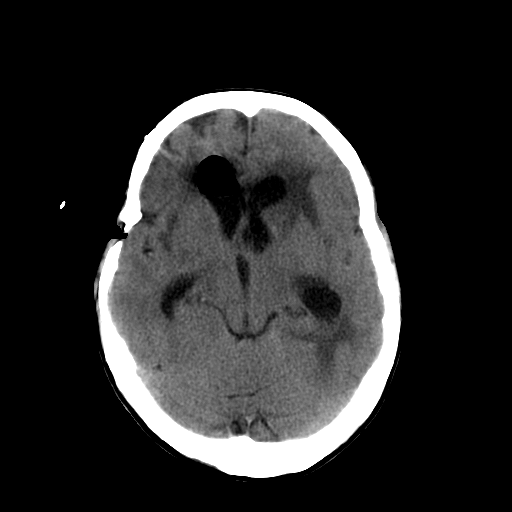
[im 14/40  brain]
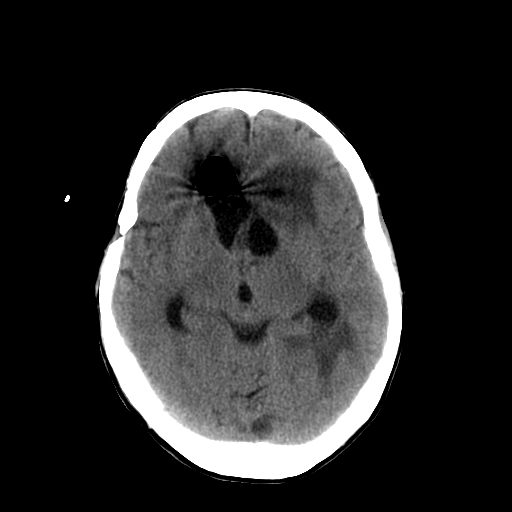
[im 14/40  bone]
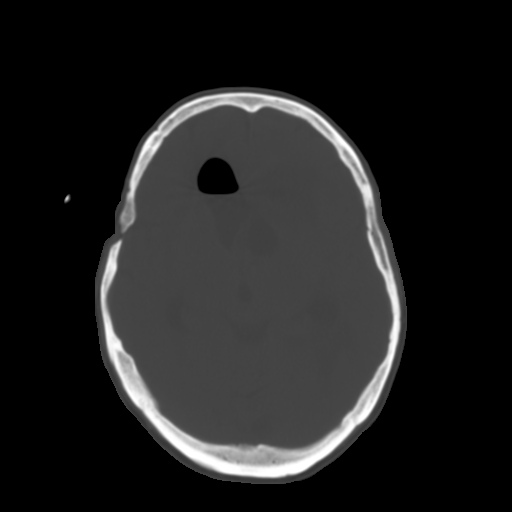
[im 17/40  brain]
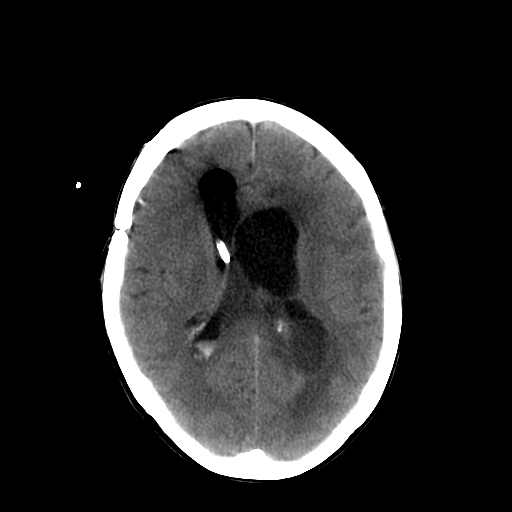
[im 20/40  brain]
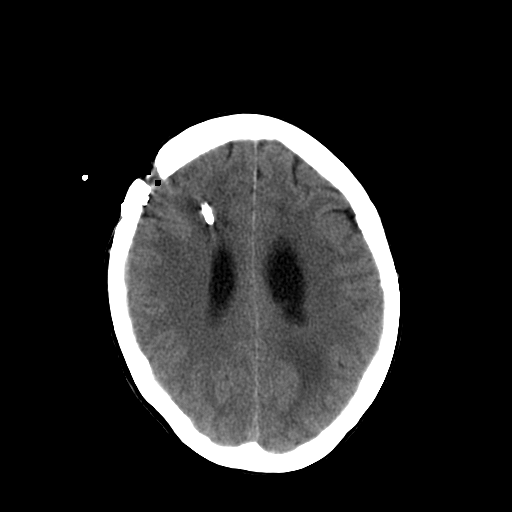
[im 23/40  brain]
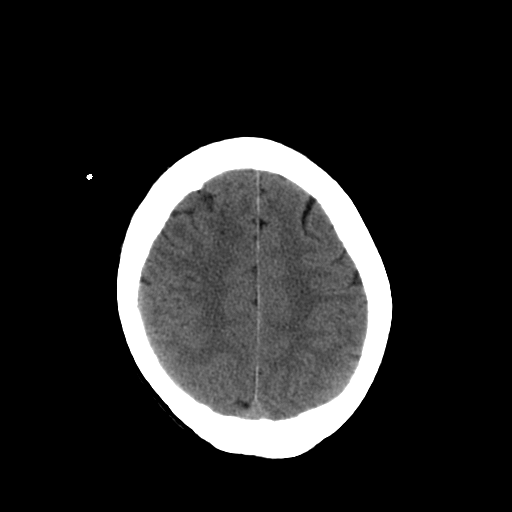
[im 26/40  brain]
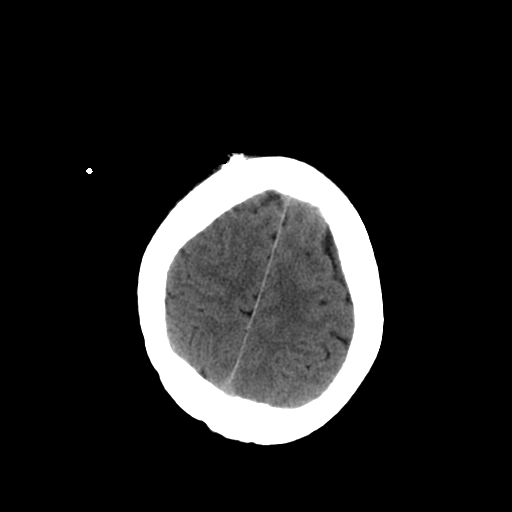
[im 26/40  bone]
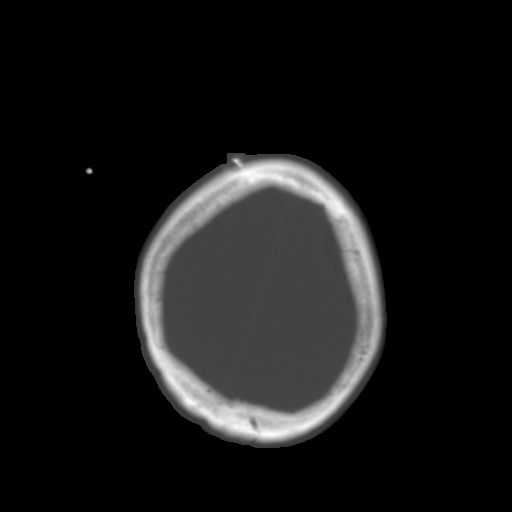
[im 28/40  brain]
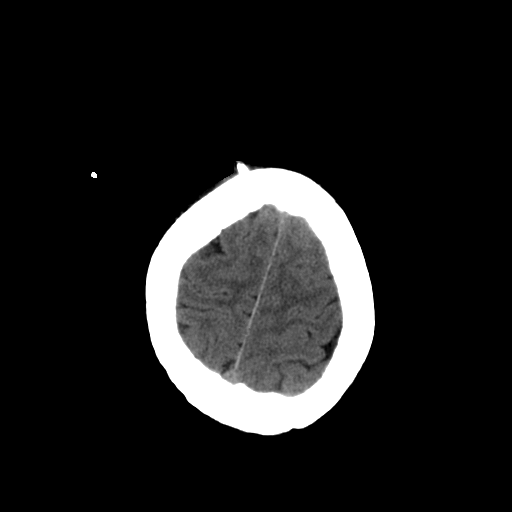
[im 31/40  brain]
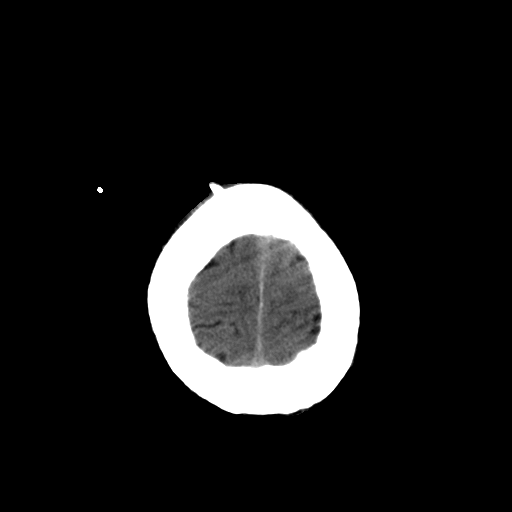
[im 34/40  brain]
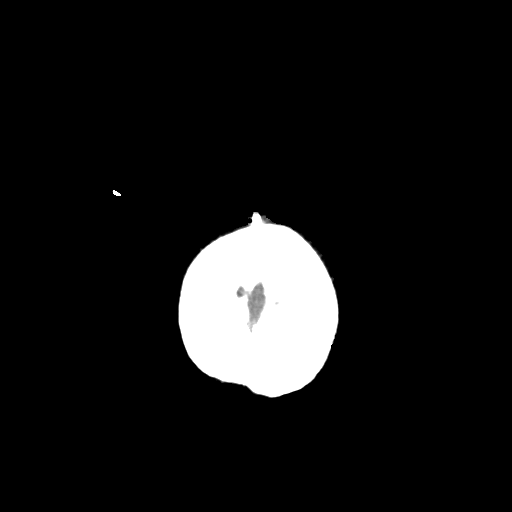
[im 37/40  brain]
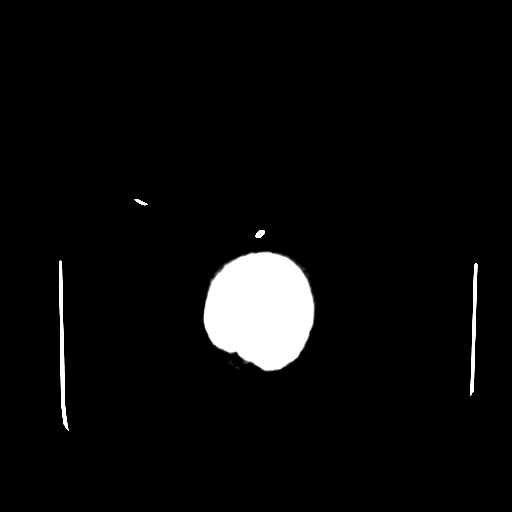
[im 37/40  bone]
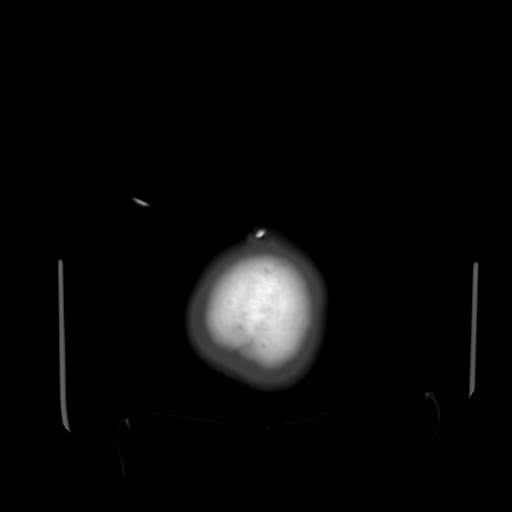

[13 of 30 positions shown; findings below may reference images not displayed]

FINDINGS: There is a right-sided ventriculostomy catheter with tip in the region of the foramen of [place]. The volume is unchanged compared to prior exam.  There are acute blood products within the dependent portions of the ventricles which is new compared to the prior exam.
 There is diffuse low attenuation within the subcortical and periventricular white matter consistent with transependymal spread of fluid.
IMPRESSION: 1.  Stable ventriculostomy catheter.  The ventricular volumes remain dilated but unchanged in volume.
 2.  Interval intraventricular hemorrhage with new acute blood products within the dependent portions of the ventricles.

## 2006-05-27 IMAGING — CR DG CHEST 1V PORT
1 series · 1 of 1 positions shown · non-contrast
Comparison: Portable chest x-ray earlier today 6903 hours and one view chest
x-ray 08/01/2005.

CLINICAL DATA: Intracranial hemorrhage. Intubation and central venous catheter
placement.

PORTABLE CHEST - 1 VIEW  [DATE]/[id number] hours:

[view not recorded]
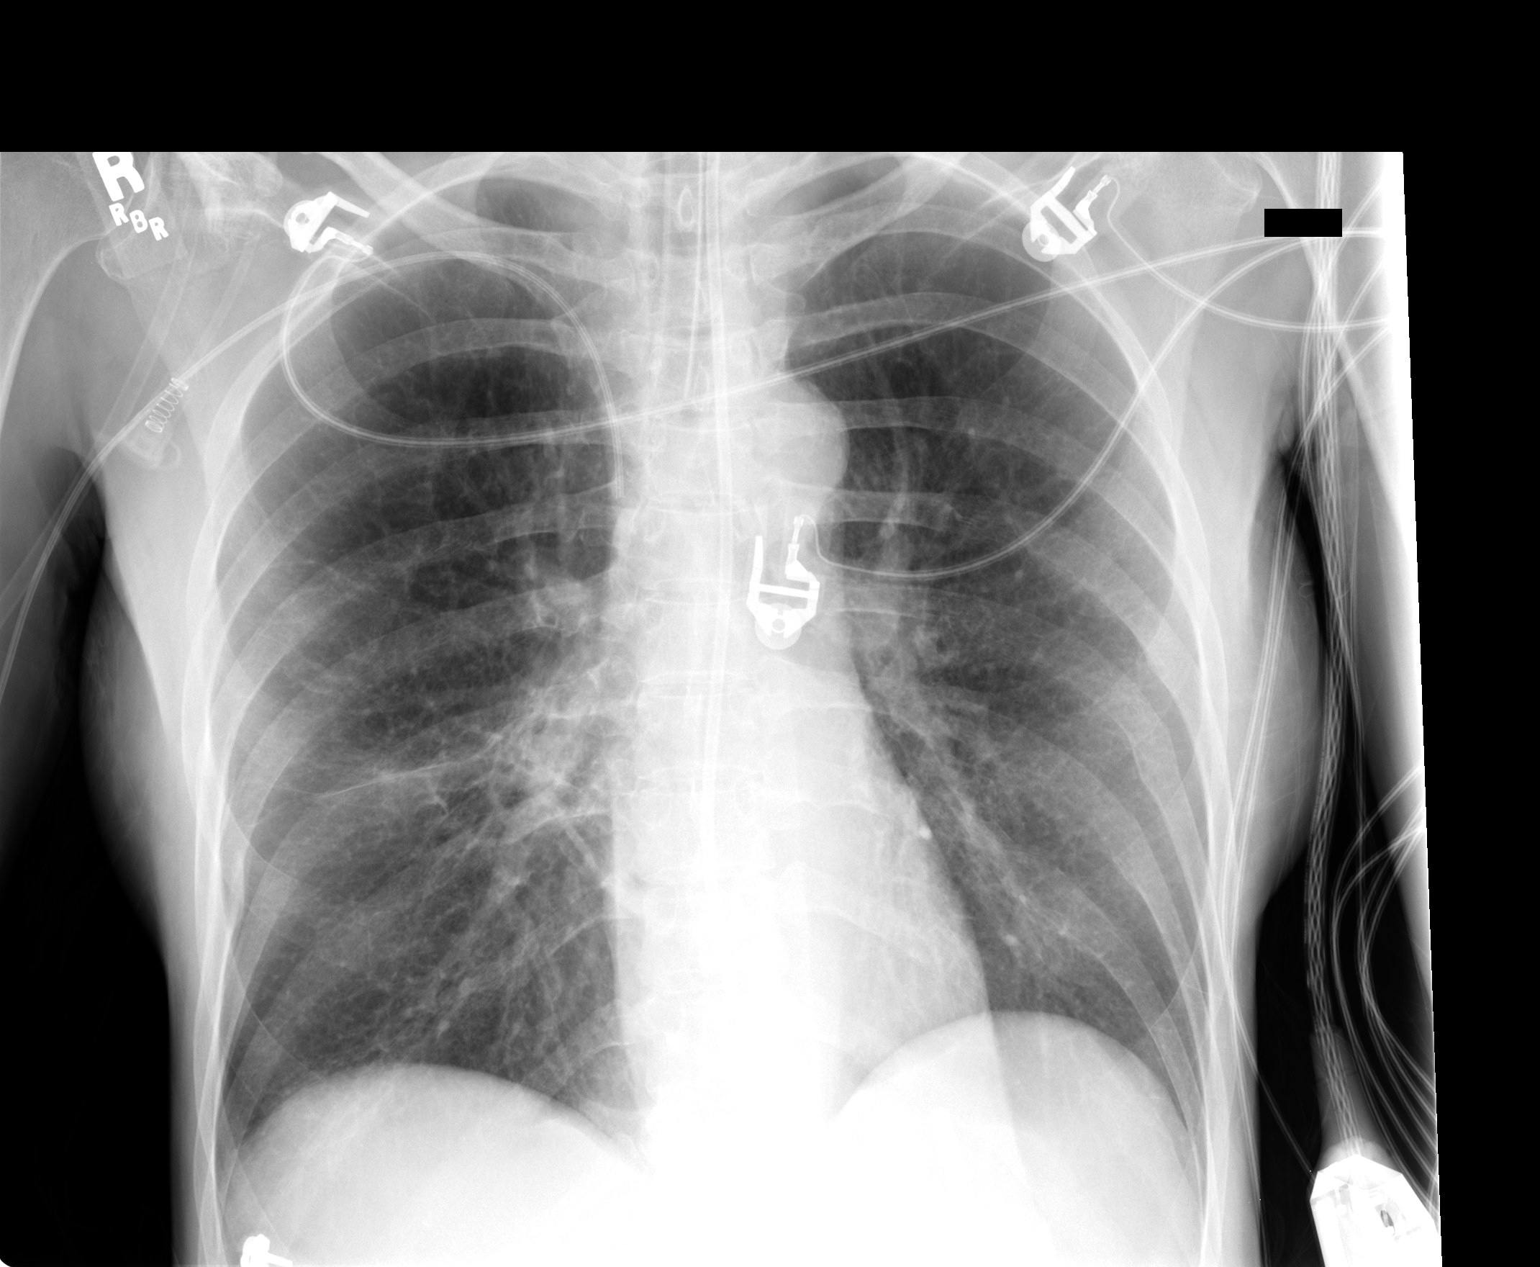

[1 of 1 positions shown; findings below may reference images not displayed]

FINDINGS: The endotracheal tube is in satisfactory position at approximately 4
cm above the carina. The right arm PICC tip remains in the upper SVC. The
feeding tube is looped in the stomach with its tip in the fundus. The linear
scar in the right midlung is again demonstrated and is unchanged. Baseline
prominent bronchovascular markings and central peribronchial thickening
consistent with COPD is unchanged. The lungs are otherwise clear.
IMPRESSION: 1. Endotracheal tube in satisfactory position approximately 4 cm above the
carina.
2. Right arm PICC tip in the upper SVC.
3. Scarring in the right midlung. COPD. No acute cardiopulmonary disease.

## 2006-05-27 IMAGING — CR DG CHEST 1V PORT
1 series · 1 of 1 positions shown · non-contrast
Comparison: 08/01/05.

CLINICAL DATA: 49 year old; respiratory distress.
 PORTABLE CHEST - 1 VIEW - 08/31/05:

[view not recorded]
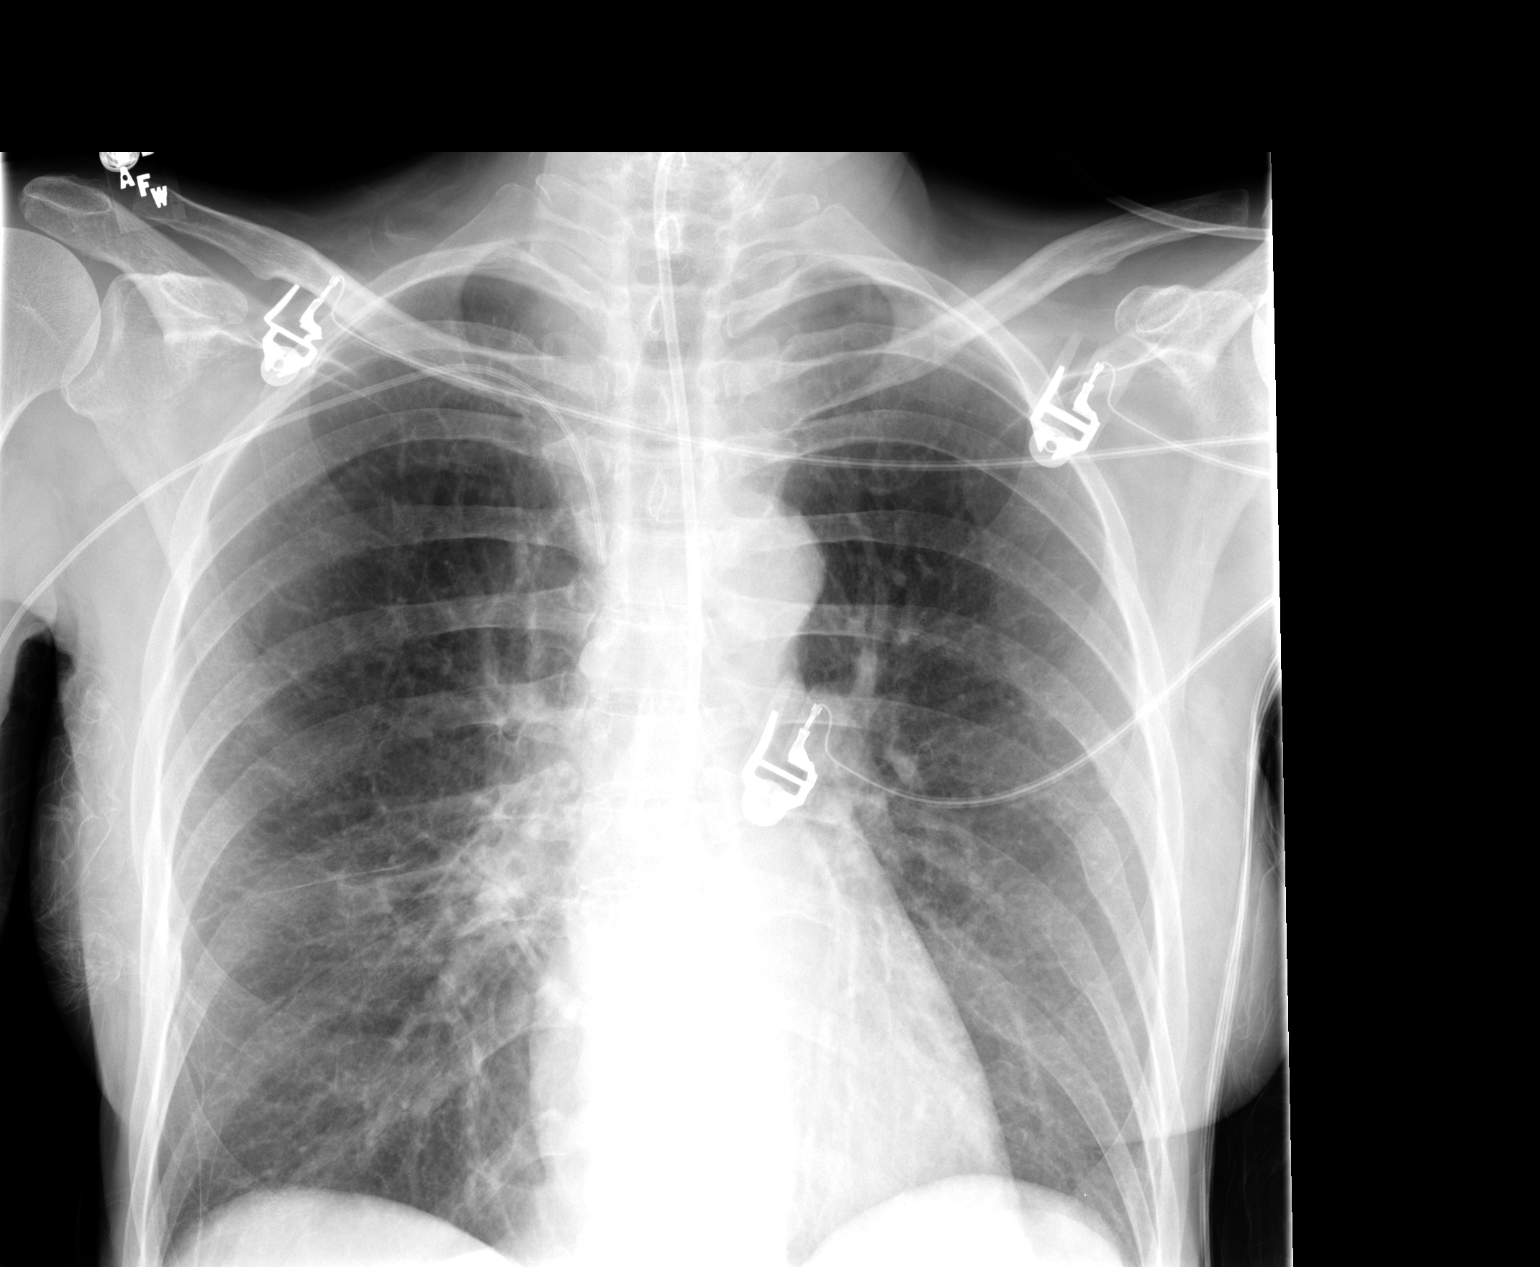

[1 of 1 positions shown; findings below may reference images not displayed]

FINDINGS: The cardiac silhouette, mediastinal and hilar contours are within normal limits and stable.  Stable underlying changes of COPD are noted with chronic interstitial scarring.  The right PICC line is in good position.  There is a Panda tube coursing down into the stomach.  No acute pulmonary findings.  Remote healed rib fractures are noted bilaterally.
IMPRESSION: COPD changes and chronic interstitial scarring.  No definite acute overlying pulmonary process.

## 2006-05-28 IMAGING — CT CT HEAD W/O CM
1 of 2 series · 13 of 30 positions shown, 17 images · non-contrast
Comparison: none

CLINICAL DATA: Intracranial hemorrhage, respiratory distress.

[Series 2: brain · axial · 0.47mm/px · z∈[+143,+267]mm · 13 of 28 slices shown, 17 images]
[im 2/28  brain]
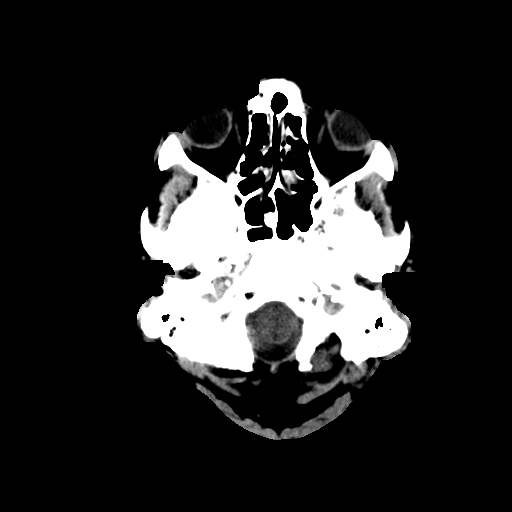
[im 2/28  bone]
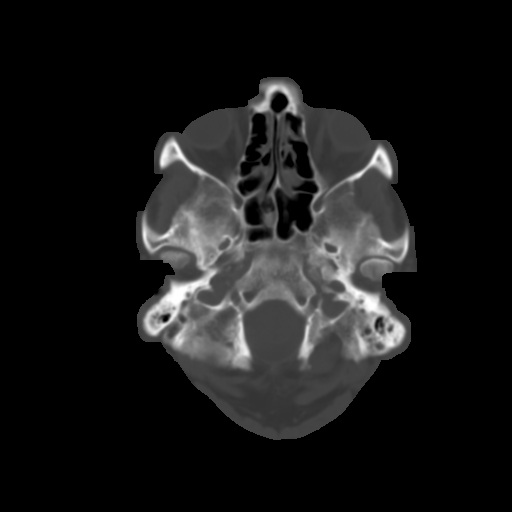
[im 4/28  brain]
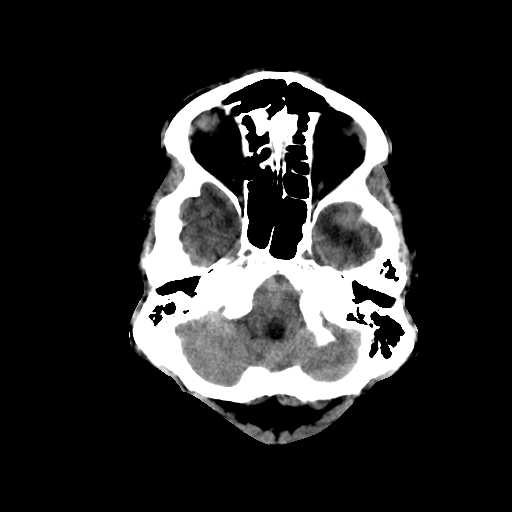
[im 6/28  brain]
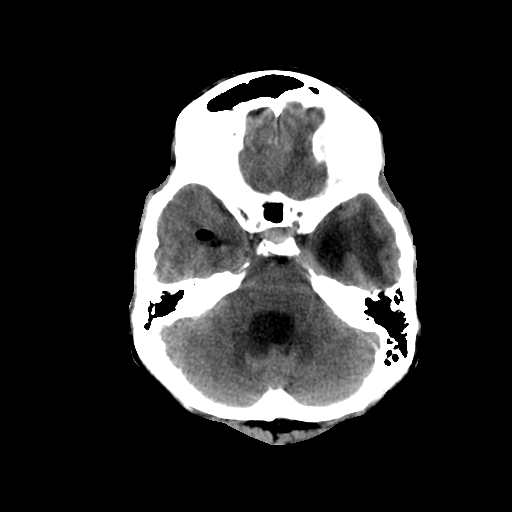
[im 8/28  brain]
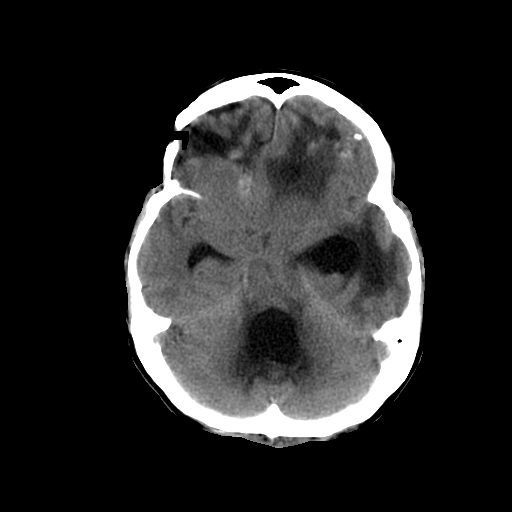
[im 10/28  brain]
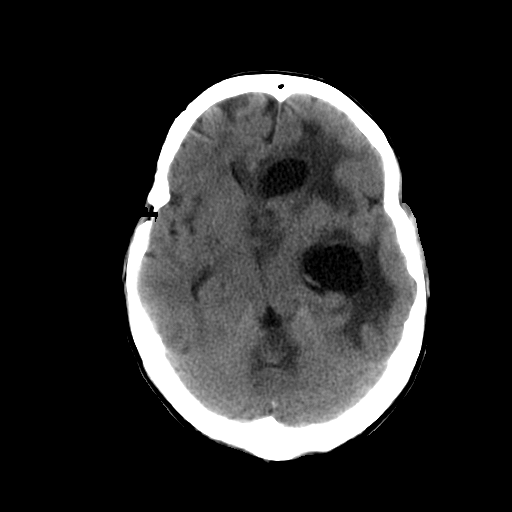
[im 10/28  bone]
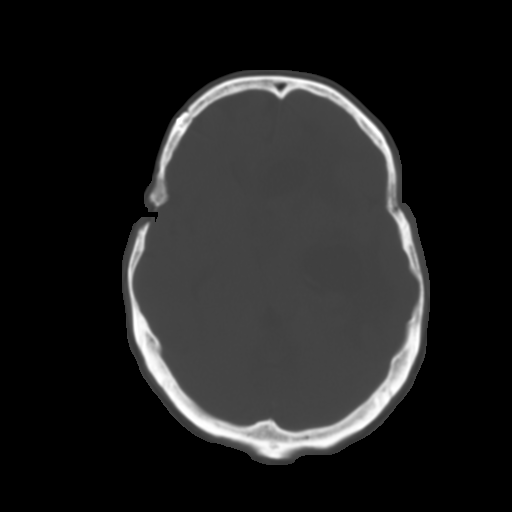
[im 12/28  brain]
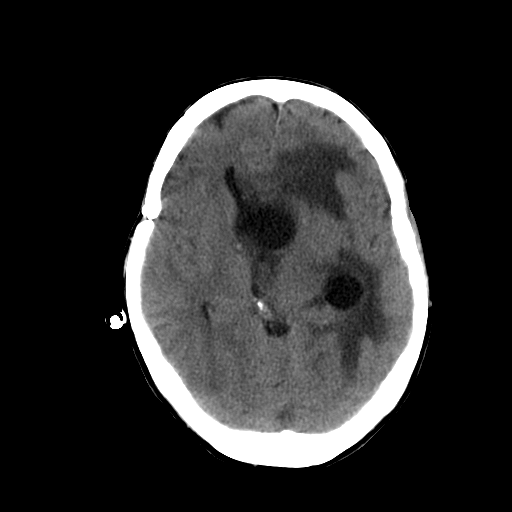
[im 14/28  brain]
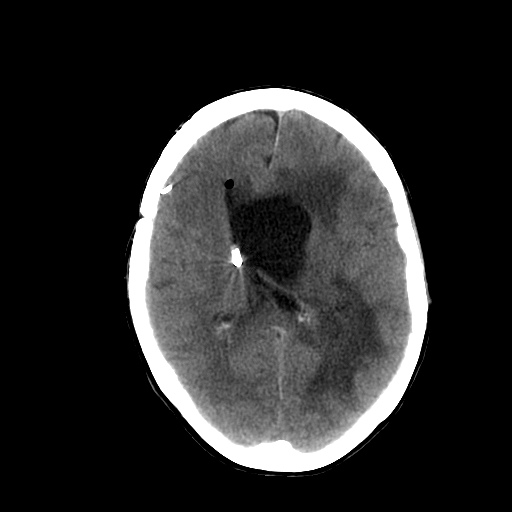
[im 16/28  brain]
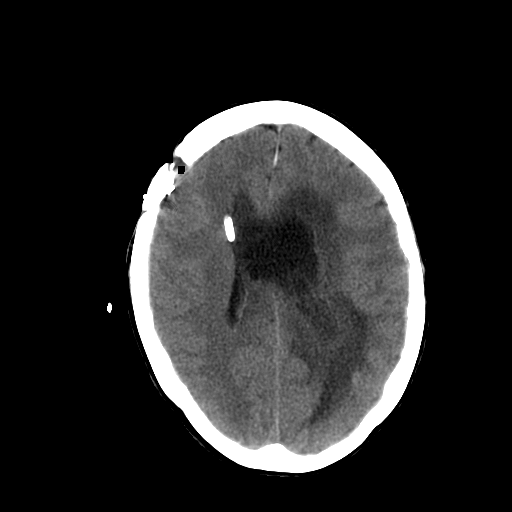
[im 18/28  brain]
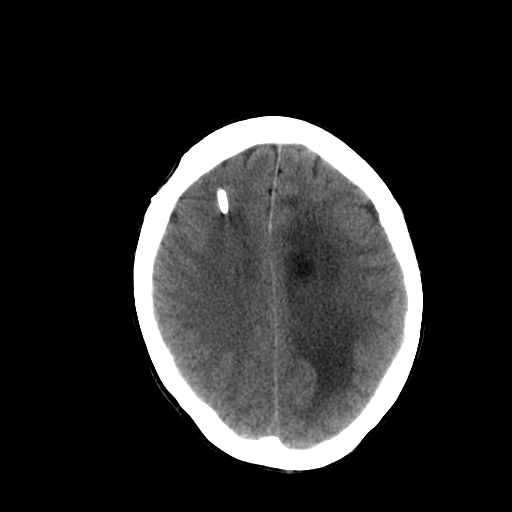
[im 18/28  bone]
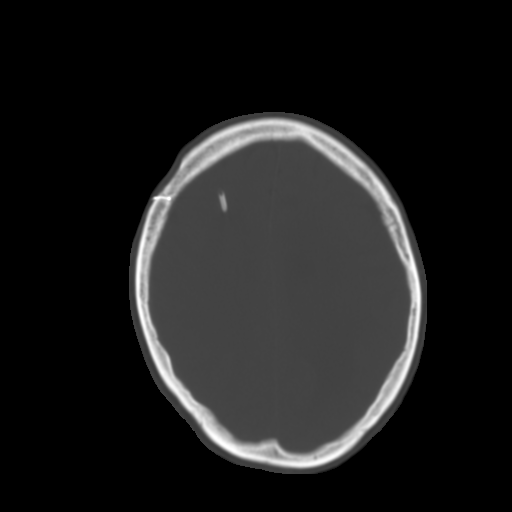
[im 20/28  brain]
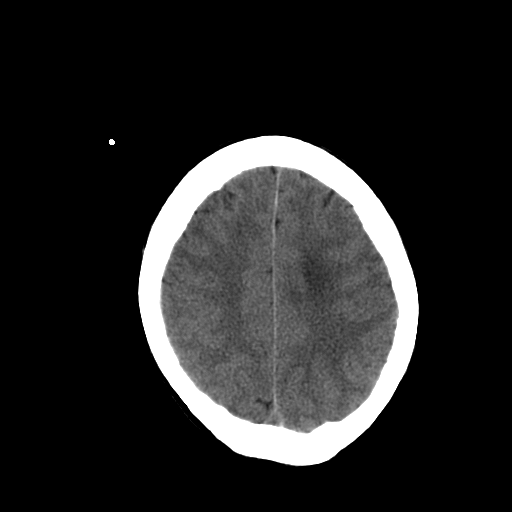
[im 22/28  brain]
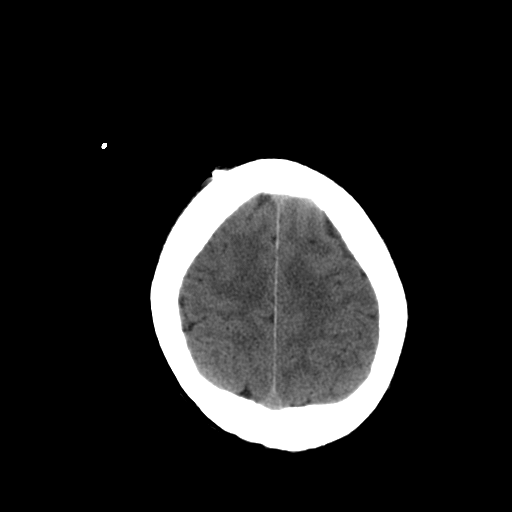
[im 24/28  brain]
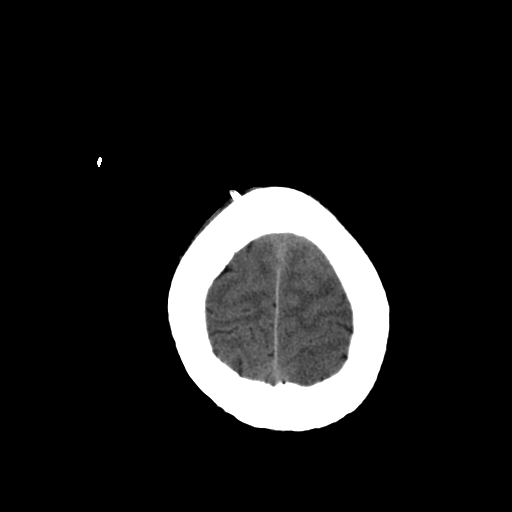
[im 26/28  brain]
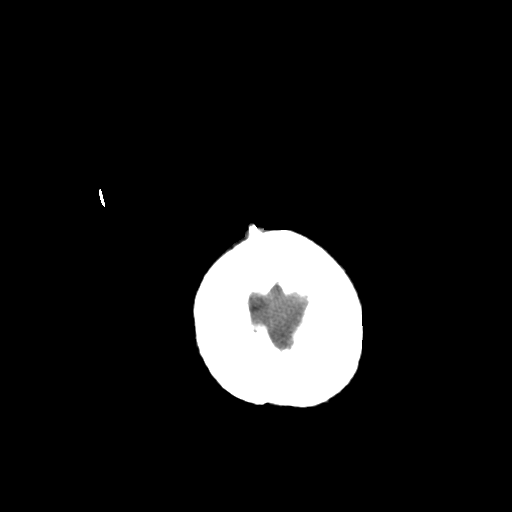
[im 26/28  bone]
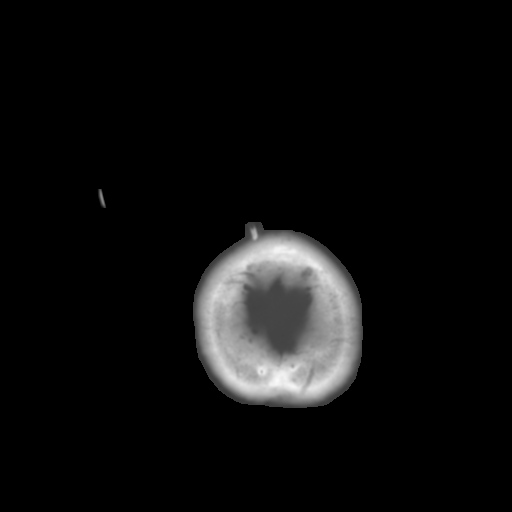

[13 of 30 positions shown; findings below may reference images not displayed]

CT head without contrast:

Comparison 08/23/2005. There has been further interval dilatation of the left
lateral ventricle including frontal and temporal horns. This results in
approximately 2 cm midline shift from left-to-right. Right frontal
ventriculostomy catheter remains in place, tip near the foramen of Bud. Third
ventricle decompressed. Fourth ventricle   has increased in degree of dilatation
since previous exam. A small amount of layering blood in the fourth ventricle.
There has been some increase in the   low attenuation around the left lateral
ventricle, frontal horn right lateral ventricle, and fourth ventricle suggesting
worsening transependymal spread of fluid. Small amount of gas is noted in
temporal and frontal horns   right lateral ventricle. Patchy areas of
hyperdensity in the frontal lobe stable. Interval effacement of basal cisterns.
IMPRESSION: 1. Worsening left lateral ventricle and fourth ventricle hydrocephalus,  with
increase in midline shift, worsening periventricular edema, and effacement of
basal cisterns.

## 2006-05-28 IMAGING — CR DG CHEST 1V PORT
1 series · 1 of 1 positions shown · non-contrast
Comparison: 08/31/2005.

CLINICAL DATA: 49-year-old, intracranial hemorrhage.  Followup pulmonary status. 
 PORTABLE CHEST - 1 VIEW:

[view not recorded]
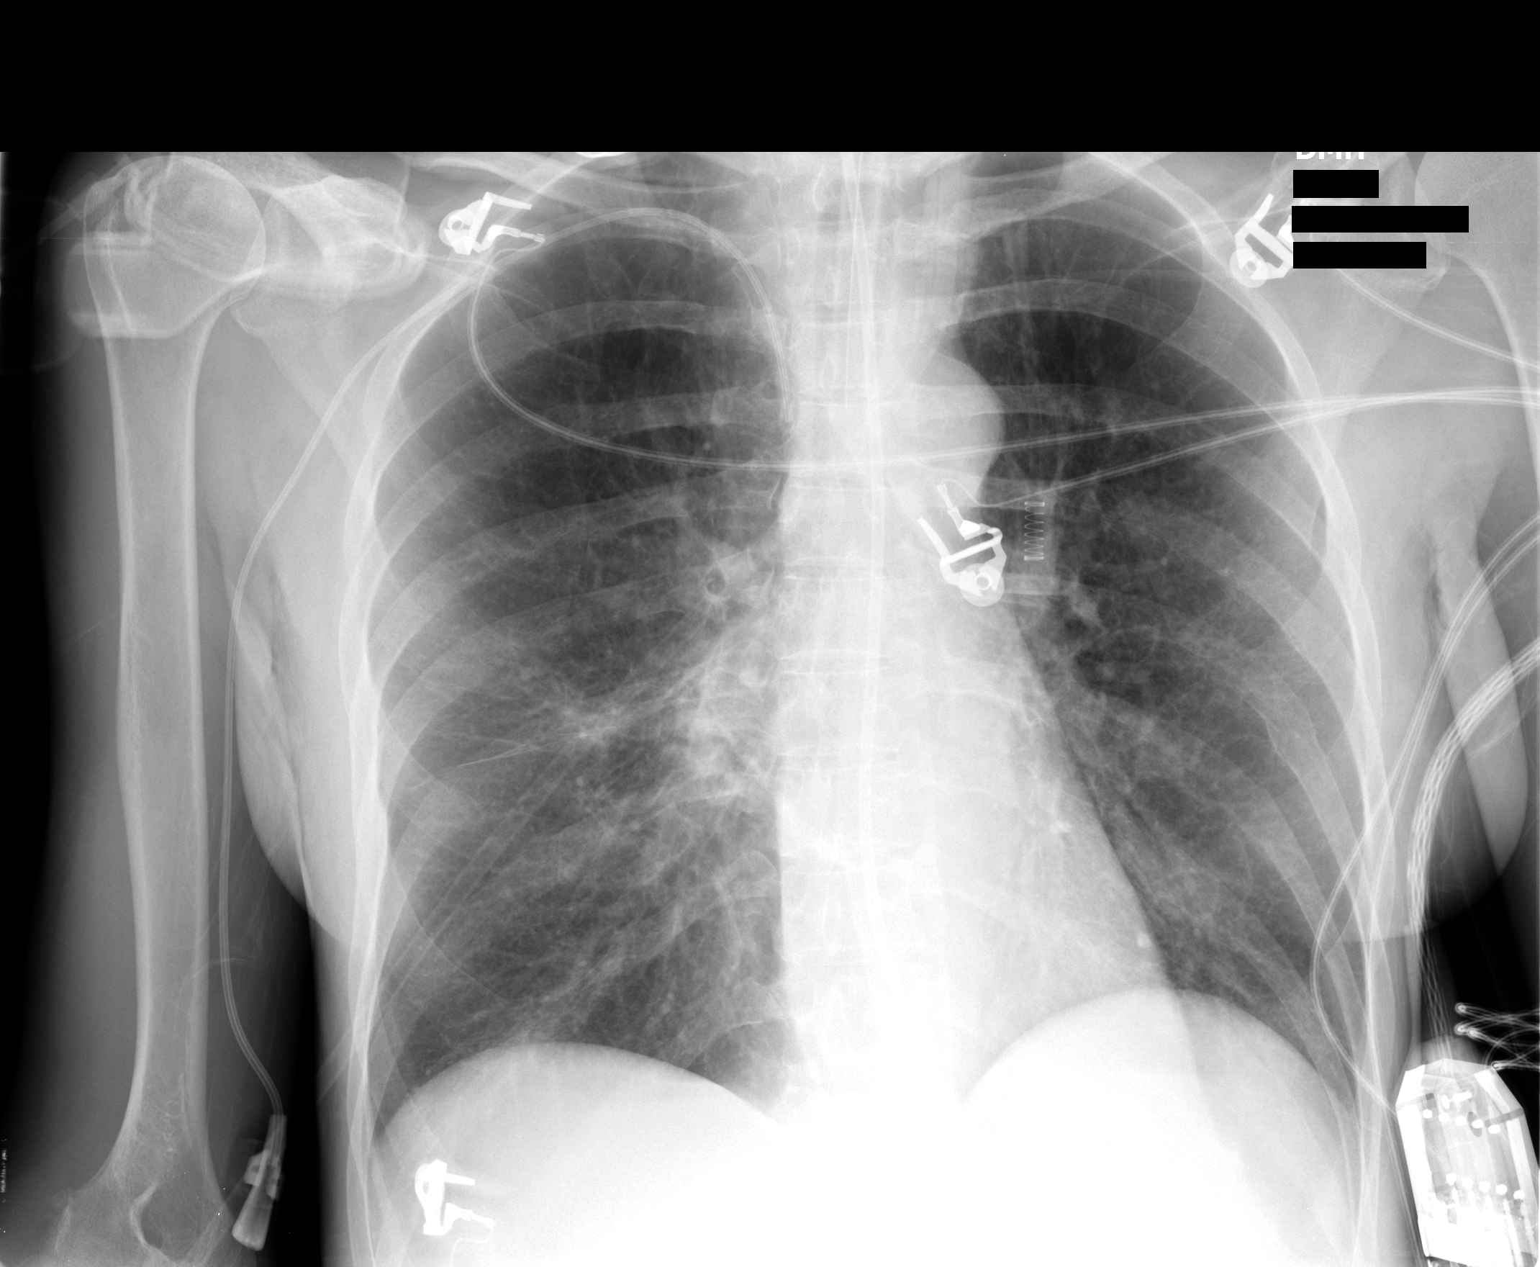

[1 of 1 positions shown; findings below may reference images not displayed]

FINDINGS: The support apparatus is stable.  Heart and lungs are relatively stable.  Persistent streaky areas of atelectasis.  No edema or effusions.  Stable changes of COPD.
IMPRESSION: Stable chest x-ray.

## 2006-05-29 IMAGING — CT CT HEAD W/O CM
1 of 2 series · 13 of 30 positions shown, 17 images · IV contrast (agent unspecified)
Comparison: 09/01/05.

CLINICAL DATA: 49 year-old-female with intracranial hemorrhage, hydrocephalus. 
HEAD CT WITHOUT CONTRAST:
TECHNIQUE: Contiguous axial images were obtained from the base of the skull through the vertex according to standard protocol without contrast.

[Series 2: brain · axial · 0.47mm/px · z∈[+139,+261]mm · 13 of 28 slices shown, 17 images]
[im 2/28  brain]
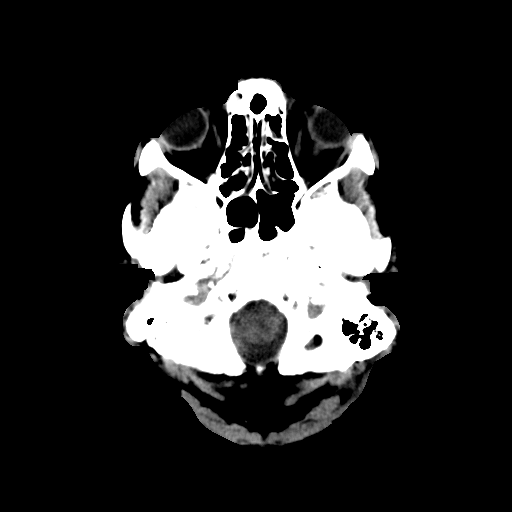
[im 2/28  bone]
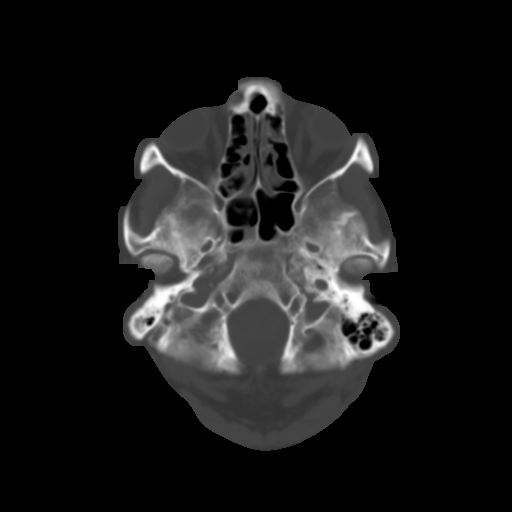
[im 4/28  brain]
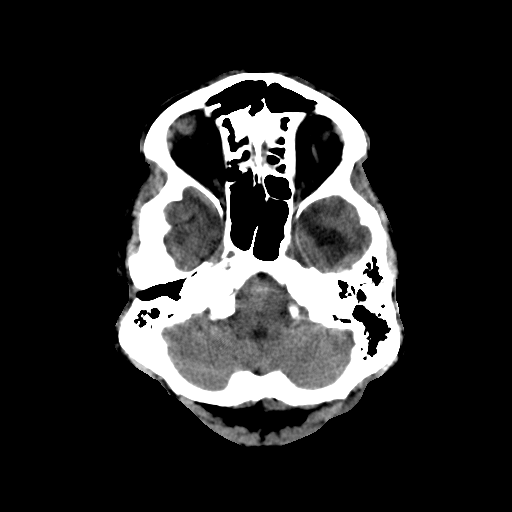
[im 6/28  brain]
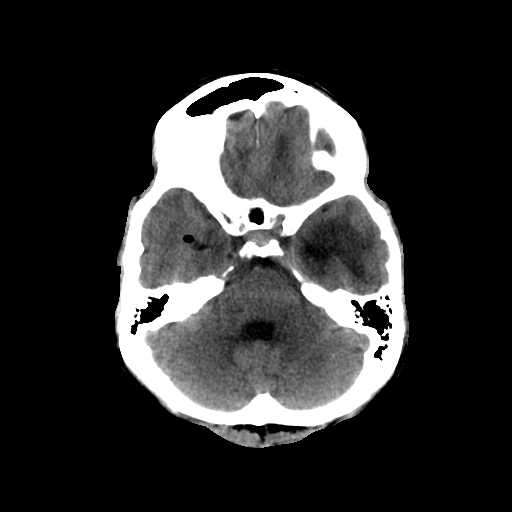
[im 8/28  brain]
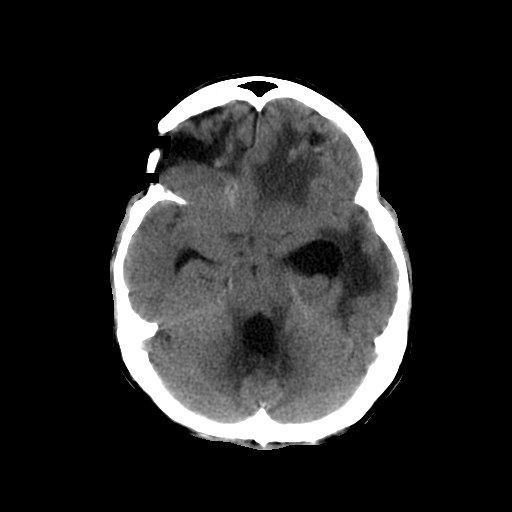
[im 10/28  brain]
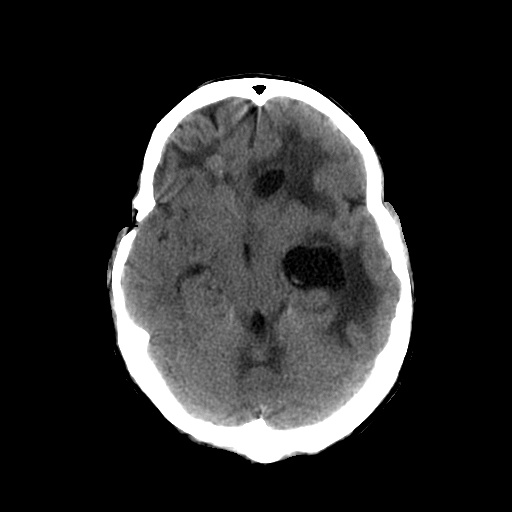
[im 10/28  bone]
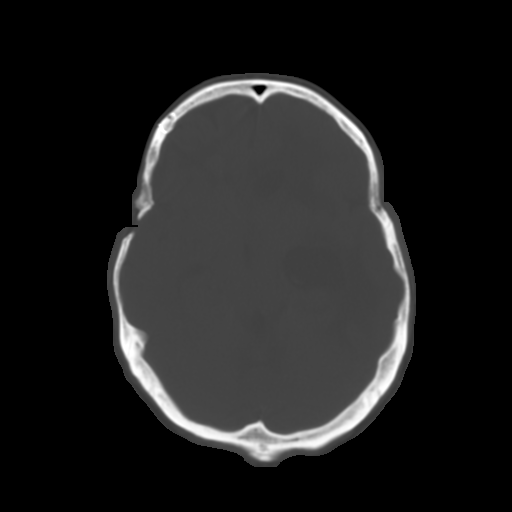
[im 12/28  brain]
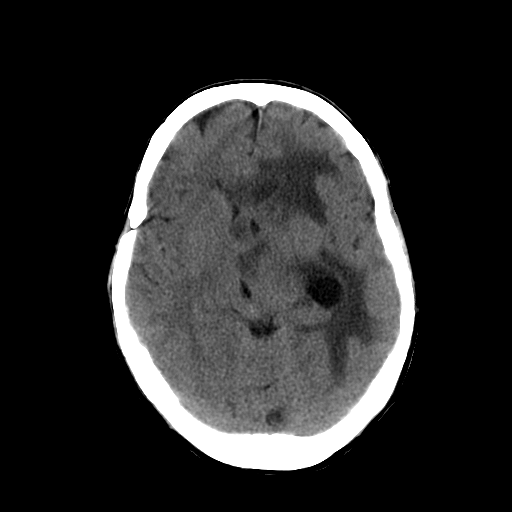
[im 14/28  brain]
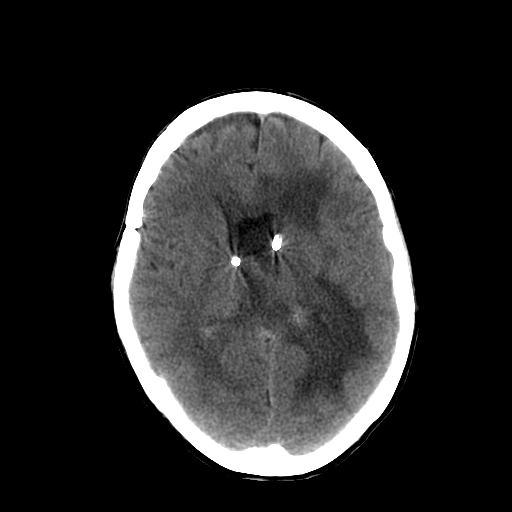
[im 16/28  brain]
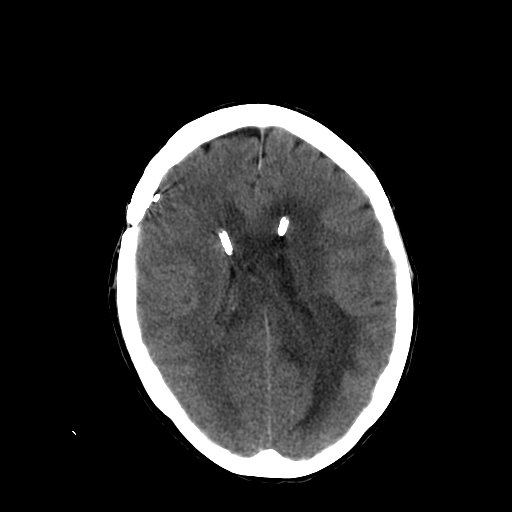
[im 18/28  brain]
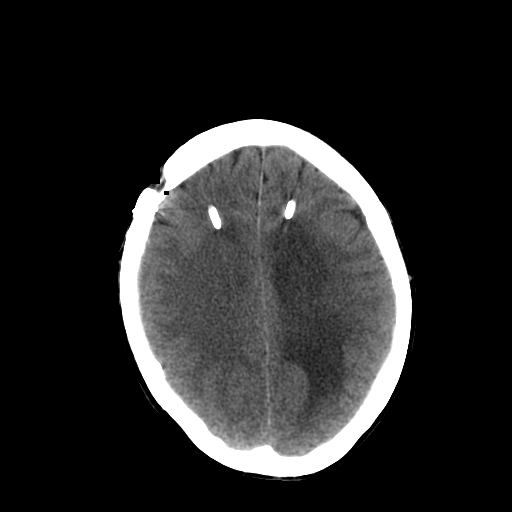
[im 18/28  bone]
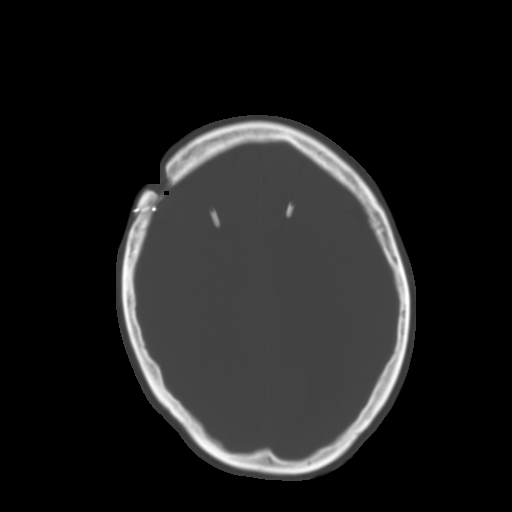
[im 20/28  brain]
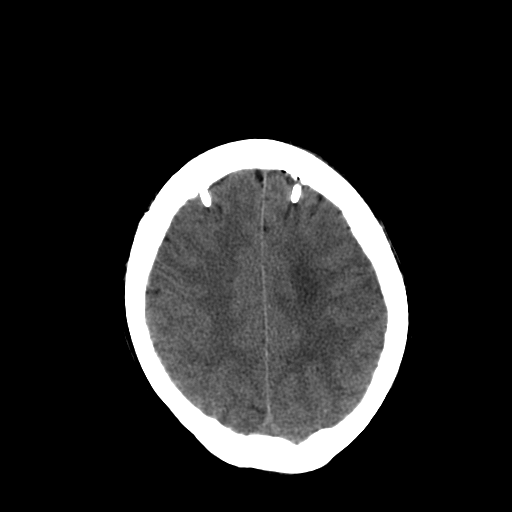
[im 22/28  brain]
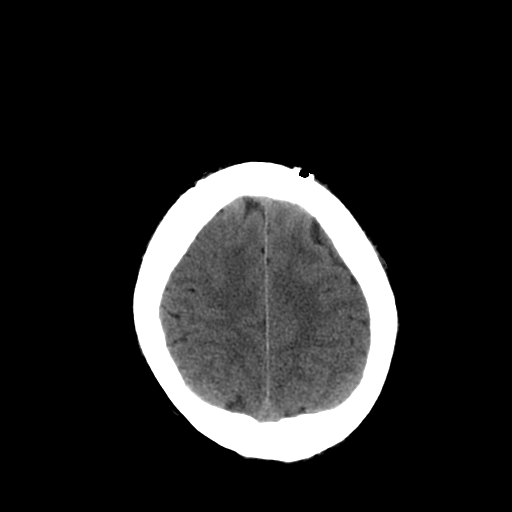
[im 24/28  brain]
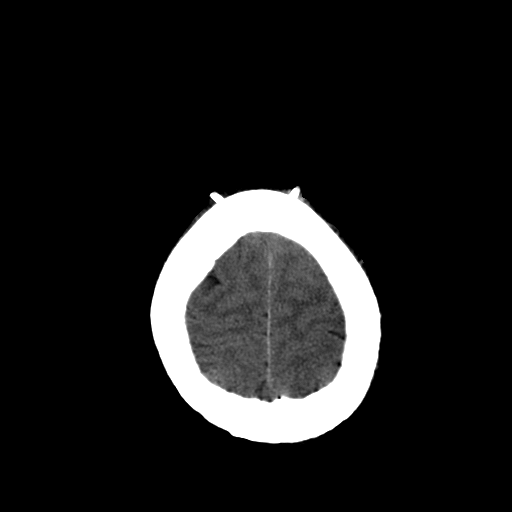
[im 26/28  brain]
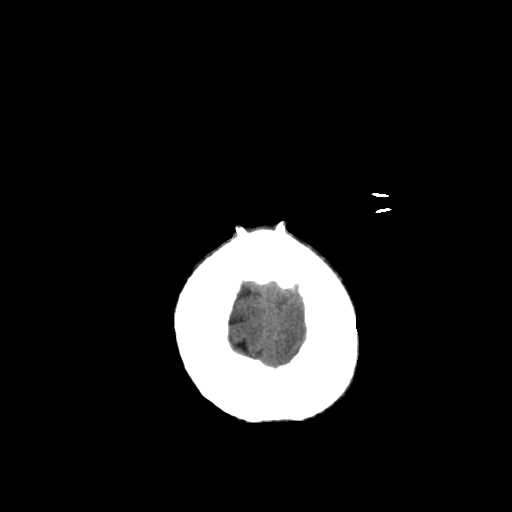
[im 26/28  bone]
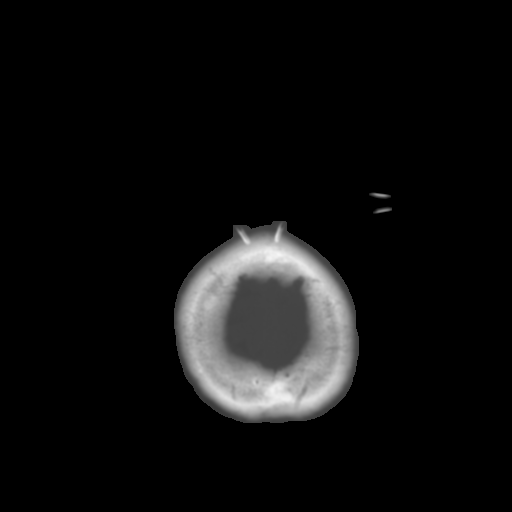

[13 of 30 positions shown; findings below may reference images not displayed]

FINDINGS: Since 09/01/05, a second ventriculostomy shunt has been inserted in the left frontal region with the tip extending into the dilated left frontal horn. There is less dilatation of the left frontal horn following insertion.  The right ventricle remains slit like.  Diffuse hypoattenuation throughout the left cerebral hemisphere white matter is noted, most consistent with transependymal resorption of CSF.  Midline shift has been reduced to roughly 15 mm.  The fourth ventricle and left temporal horn remain dilated but to a slightly lesser degree.  Resolving hemorrhagic changes are present in the frontal lobes inferiorly.  Intraventricular air is again noted.
IMPRESSION: 1.   Interval second ventriculostomy inserted on the left side for obstructive hydrocephalus with slight improvement in the left lateral ventricle and 4th ventricle dilatation. 
2.   Improvement in the midline shift, now measuring 15 mm.   
3.     No new hemorrhage.

## 2006-05-30 IMAGING — CR DG CHEST 1V PORT
1 series · 1 of 1 positions shown · non-contrast
Comparison: 09/01/2005.

CLINICAL DATA: Intracranial hemorrhage.  Follow-up aeration.
 PORTABLE CHEST, ONE VIEW ? 09/03/2005 ? (5035 HOURS):

[view not recorded]
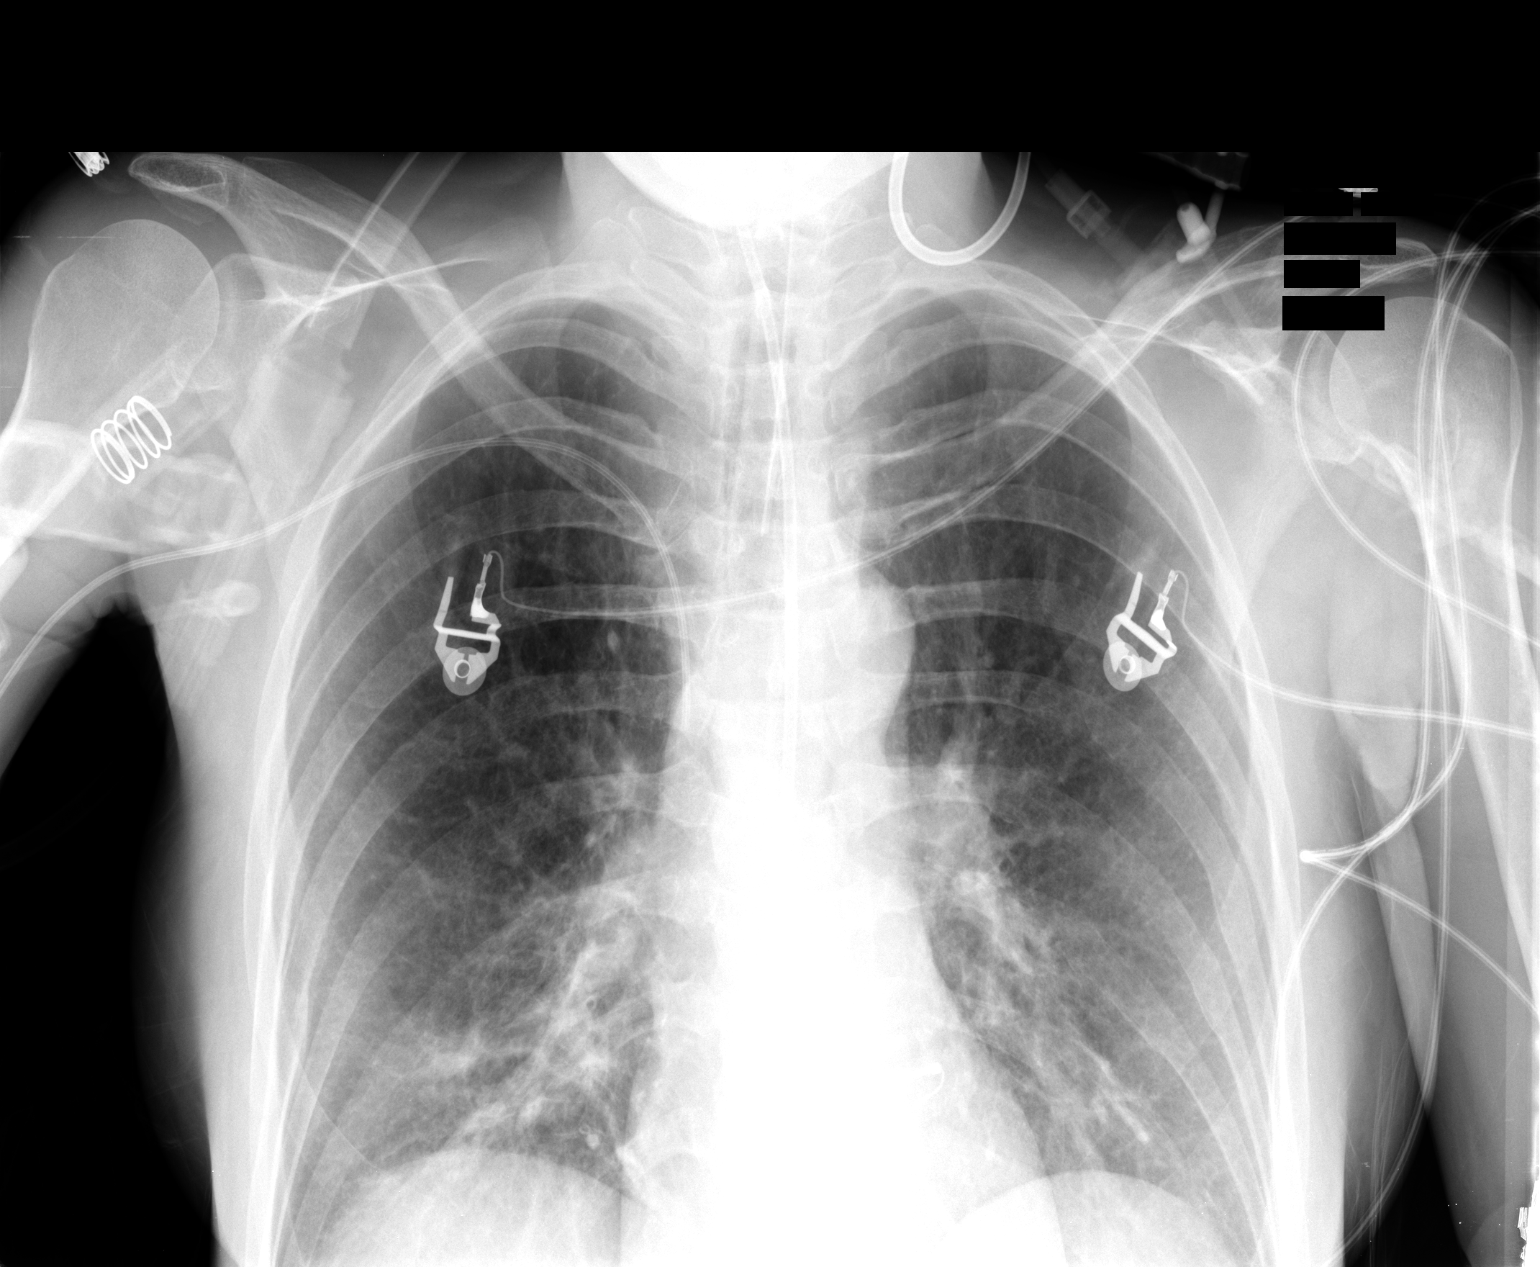

[1 of 1 positions shown; findings below may reference images not displayed]

FINDINGS: There are mild worsening bibasilar atelectatic changes.  There are changes of COPD noted.  Endotracheal tube and central venous catheter are unchanged in position and appear in satisfactory position.
IMPRESSION: Mild worsening of bibasilar atelectasis.  COPD again noted.

## 2006-05-31 IMAGING — CR DG CHEST 1V PORT
1 series · 1 of 1 positions shown · non-contrast
Comparison: 09/03/05.

CLINICAL DATA: Intracranial hemorrhage.
 PORTABLE CHEST ? 09/04/05 ? [DATE] HOURS:

[view not recorded]
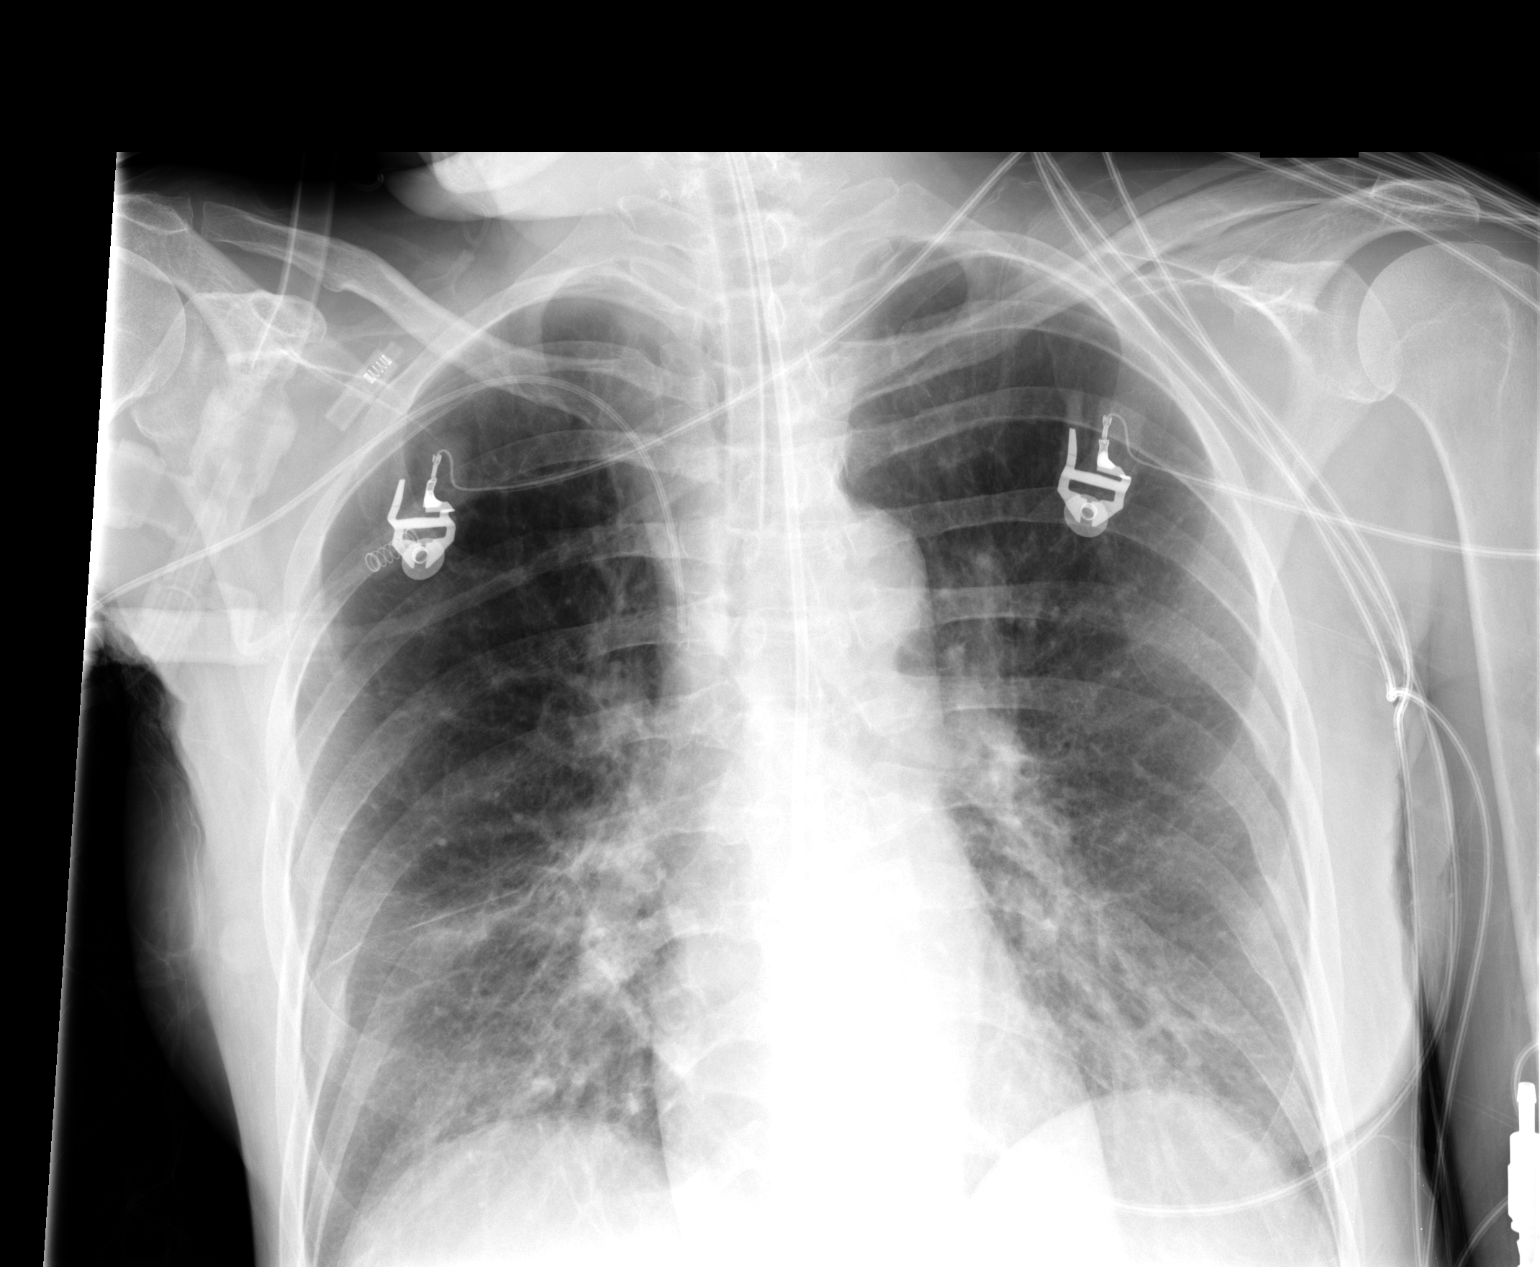

[1 of 1 positions shown; findings below may reference images not displayed]

FINDINGS: The endotracheal and nasointestinal feeding tubes are stable.  The tip of the feeding tube is in the stomach.  The PICC is stable with tip in the SVC.  Bibasilar heterogeneous opacities have increased.  No pneumothoraces are seen.  No effusions are seen.
IMPRESSION: Increasing bibasilar opacities, either atelectasis or airspace disease such as edema.

## 2006-06-01 IMAGING — CR DG CHEST 1V PORT
1 series · 1 of 1 positions shown · non-contrast
Comparison: 09/04/05.

CLINICAL DATA: 49-year-old with intracranial hemorrhage.  Tracheostomy tube placed.
 PORTABLE CHEST - 1 VIEW:

[view not recorded]
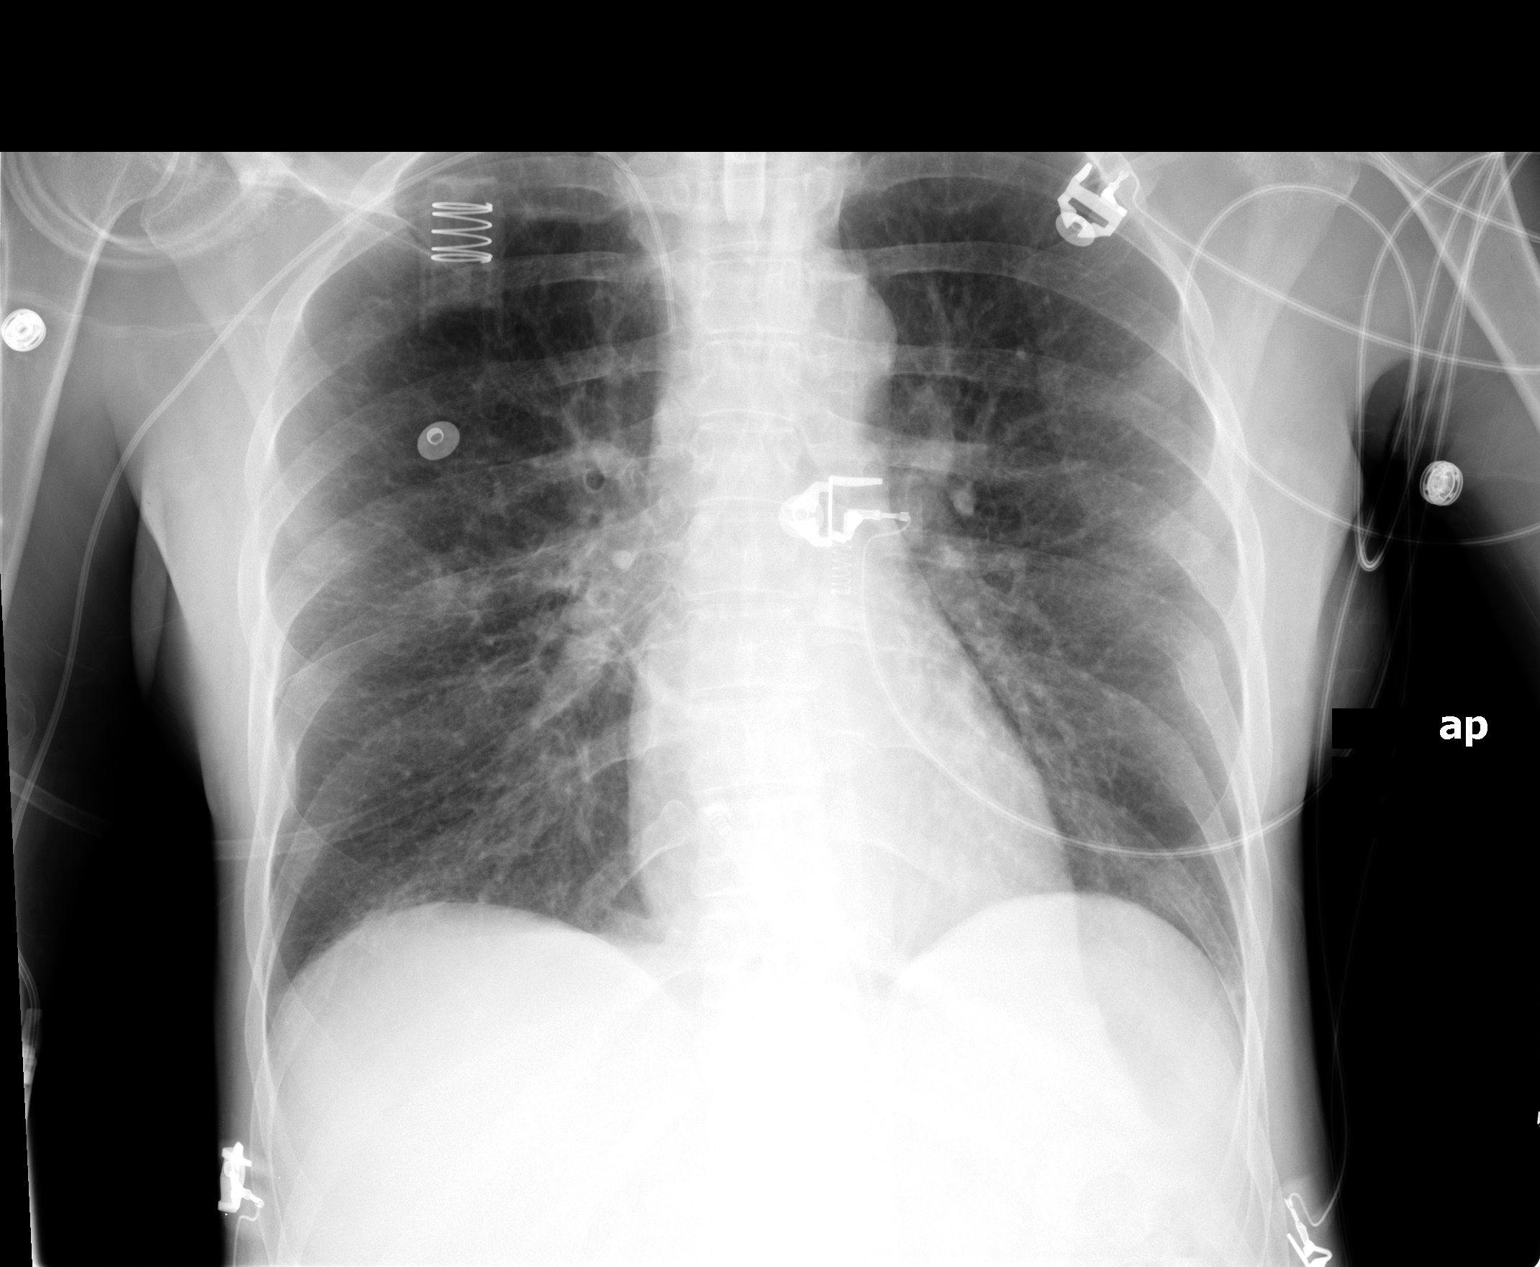

[1 of 1 positions shown; findings below may reference images not displayed]

FINDINGS: The endotracheal tube has been removed.  The tracheostomy tube has been placed, which is in good position at the mid trachea level.  The right PICC line is stable.  The lungs are slightly better aerated with less atelectasis.  No effusions.
IMPRESSION: 1.  Tracheostomy tube in good position without complicating features.
 2.  Improved lung aeration.

## 2006-06-01 IMAGING — CT CT HEAD W/O CM
1 series · 16 of 30 positions shown, 20 images · IV contrast (agent unspecified)
Comparison: Multiple previous studies, the most recent is from 09/02/2005.

CLINICAL DATA: 49-year-old, brain tumor, intracranial hemorrhage.  
 HEAD CT WITHOUT CONTRAST:
TECHNIQUE: Contiguous axial images were obtained from the base of the skull through the vertex according to standard protocol without contrast.

[Series 2: headseq 4.8 h45s · axial · 0.49mm/px · z∈[-144,-4]mm · 16 of 33 slices shown, 20 images]
[im 2/33  brain]
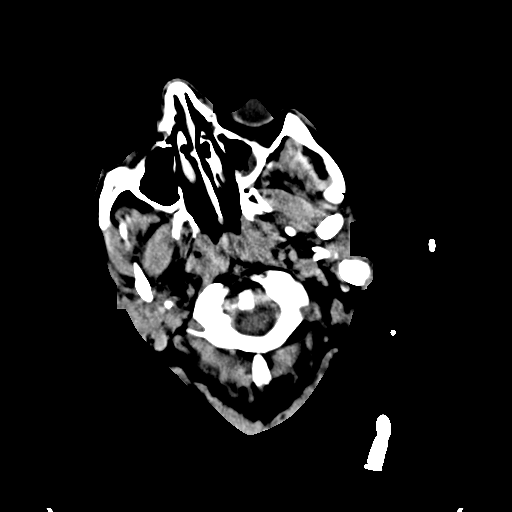
[im 2/33  bone]
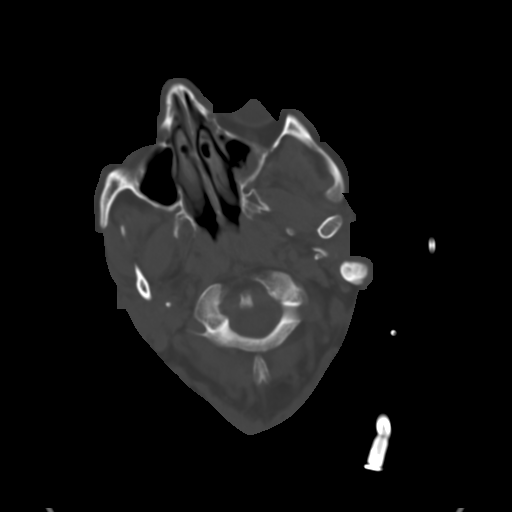
[im 4/33  brain]
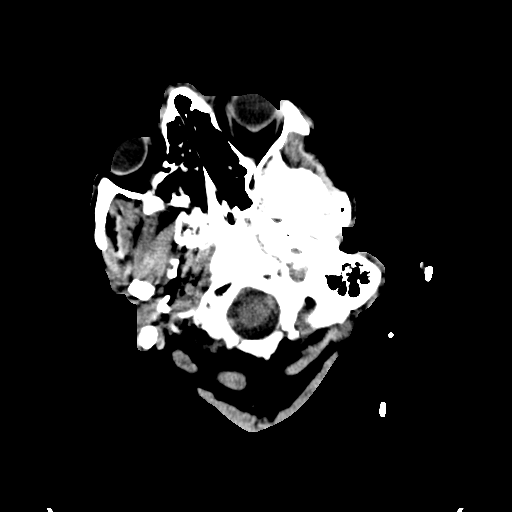
[im 6/33  brain]
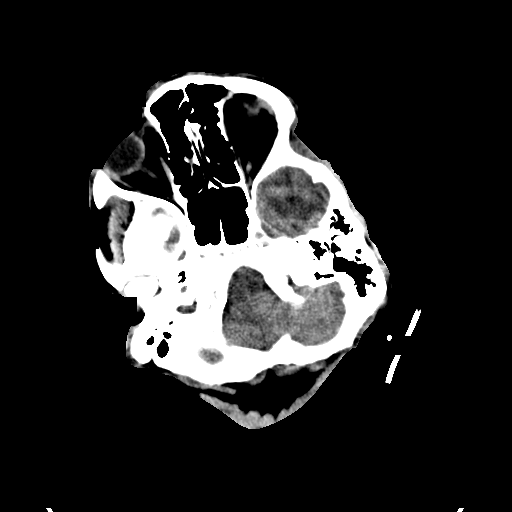
[im 8/33  brain]
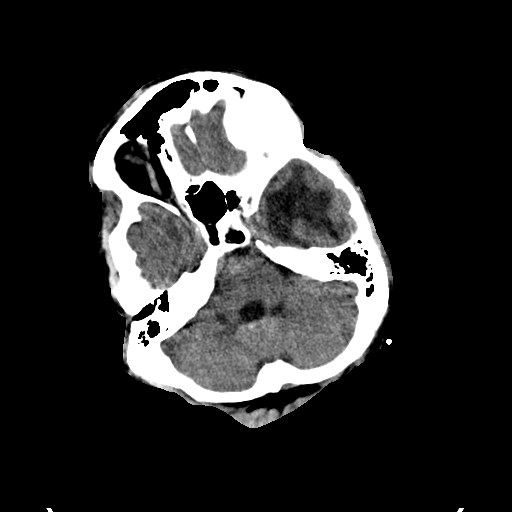
[im 9/33  brain]
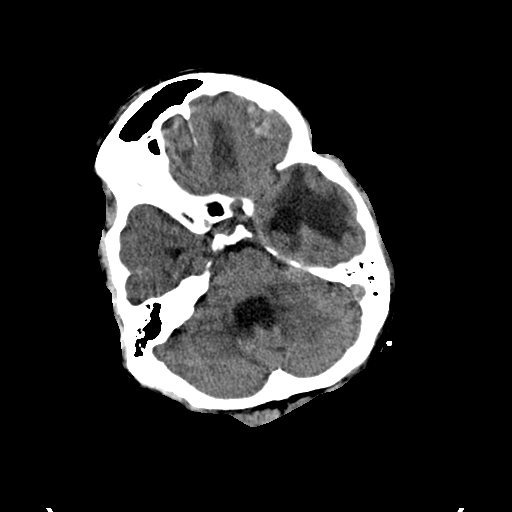
[im 9/33  bone]
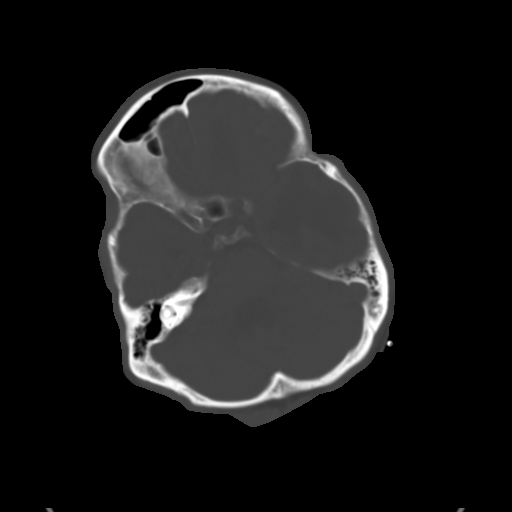
[im 12/33  brain]
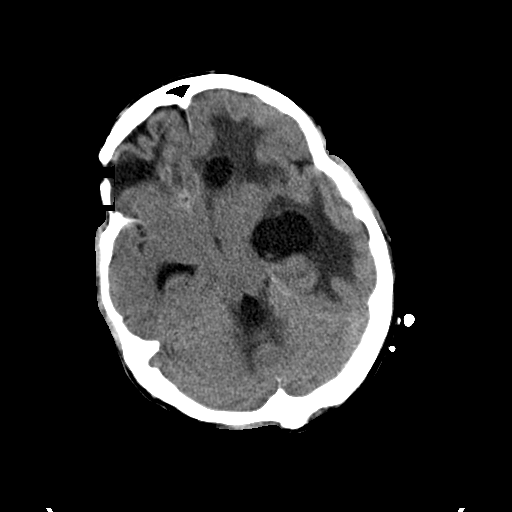
[im 14/33  brain]
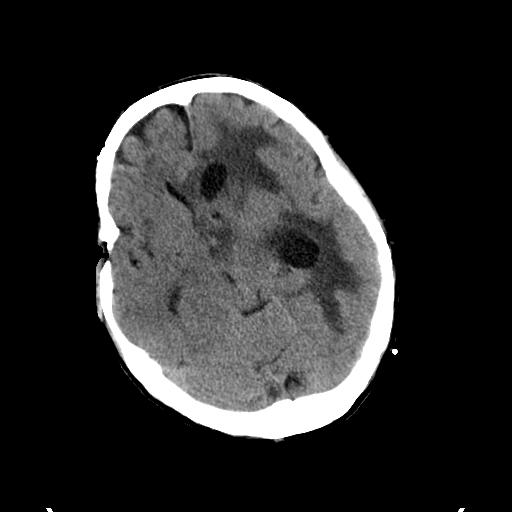
[im 16/33  brain]
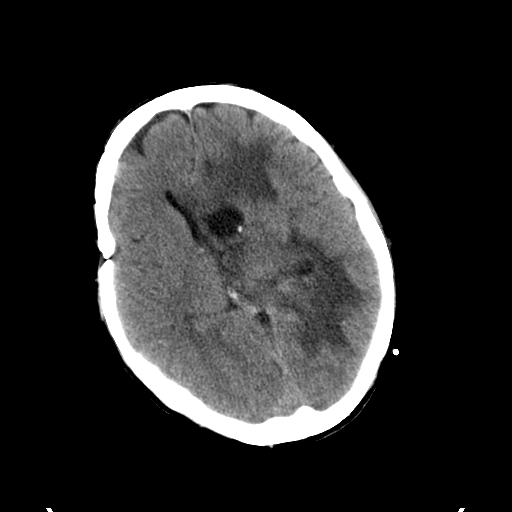
[im 17/33  brain]
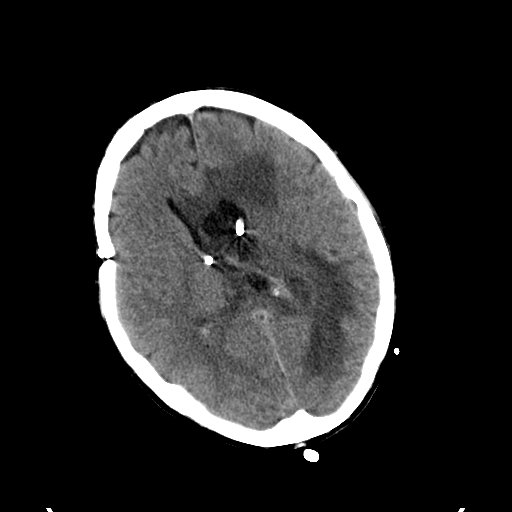
[im 17/33  bone]
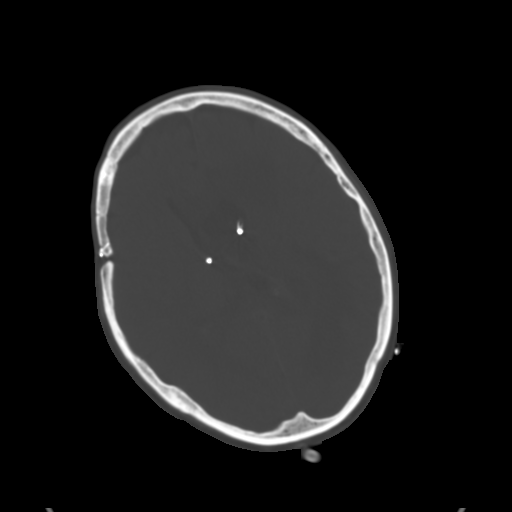
[im 19/33  brain]
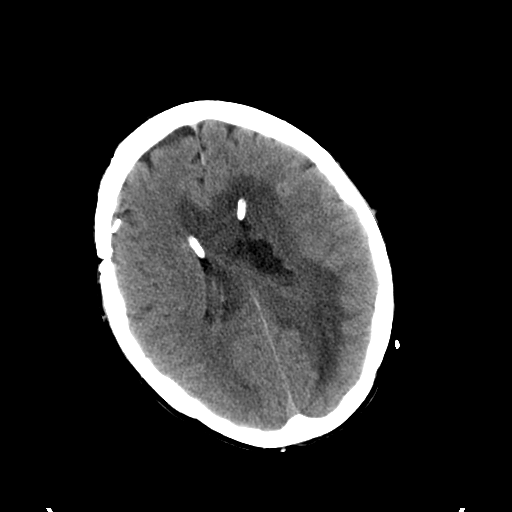
[im 21/33  brain]
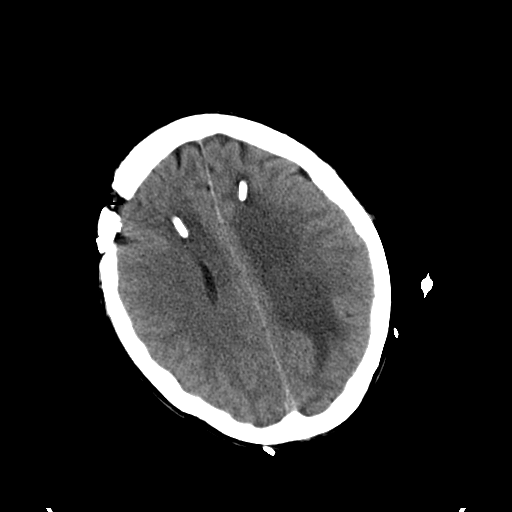
[im 24/33  brain]
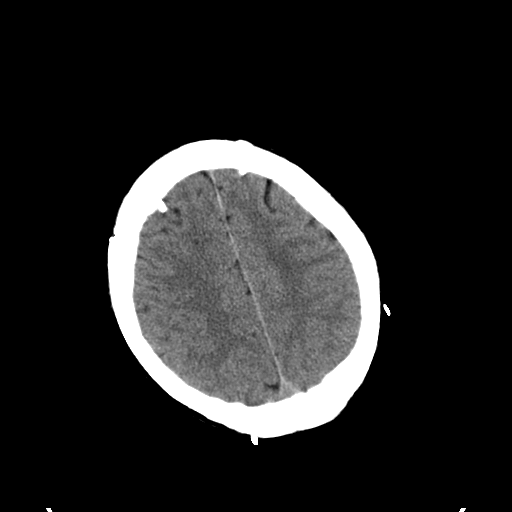
[im 25/33  brain]
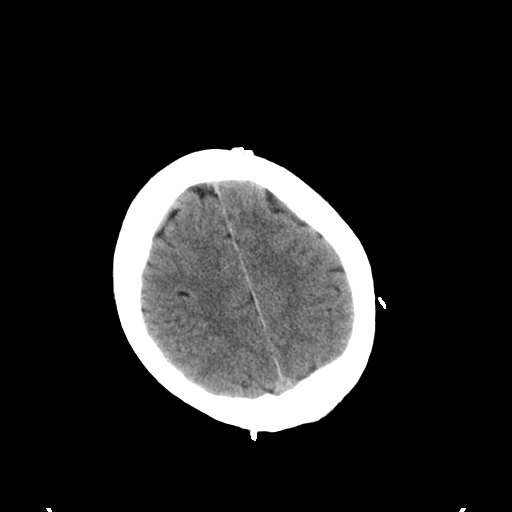
[im 25/33  bone]
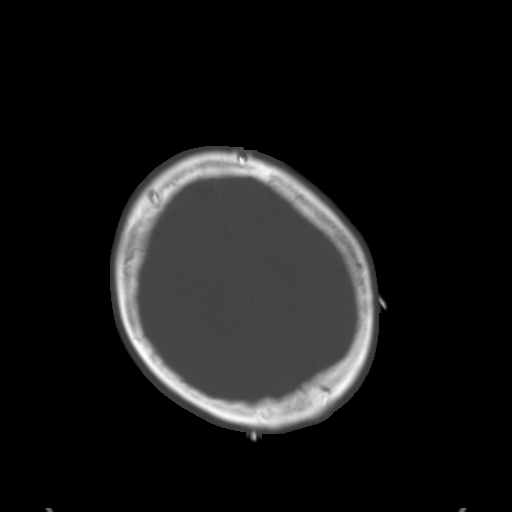
[im 27/33  brain]
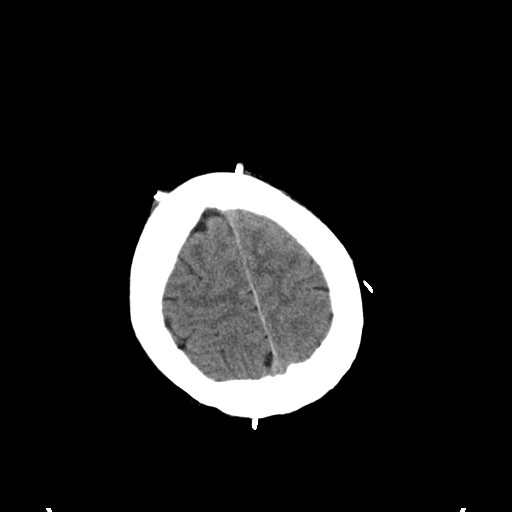
[im 29/33  brain]
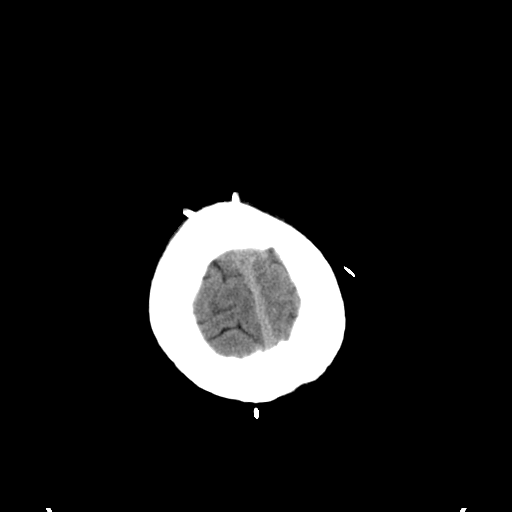
[im 31/33  brain]
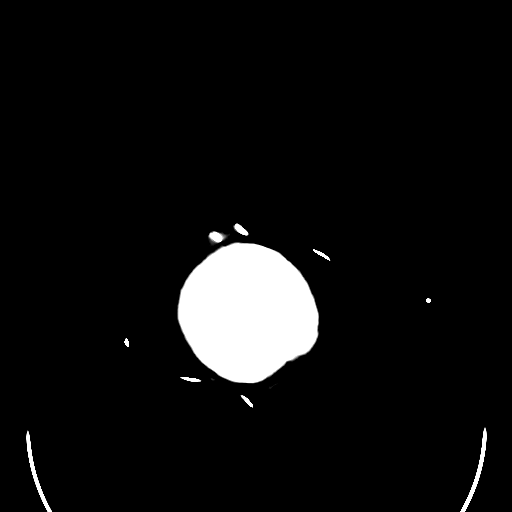

[16 of 30 positions shown; findings below may reference images not displayed]

FINDINGS: The ventriculostomy shunt catheters are in stable position.  Ventricles are stable in size and appearance.  Stable white matter edema on the left side.  Left-to-right shift is now 9 mm.  It was previously 15 mm.  Small amount of hemorrhage in the frontal lobes.  The air in the right temporal horn has resolved.  Fourth ventricle remains slightly enlarged and there is a small amount of blood layering in it.
IMPRESSION: Overall stable CT appearance of the brain since the prior examination.  Slightly less midline shift.  Left ventricle is stable in size.

## 2006-06-10 IMAGING — CR DG CHEST 1V PORT
1 series · 1 of 1 positions shown · non-contrast
Comparison: 09/05/05.

CLINICAL DATA: Intracranial hemorrhage.  Shortness of breath. 
 PORTABLE CHEST ? 1 VIEW:

[view not recorded]
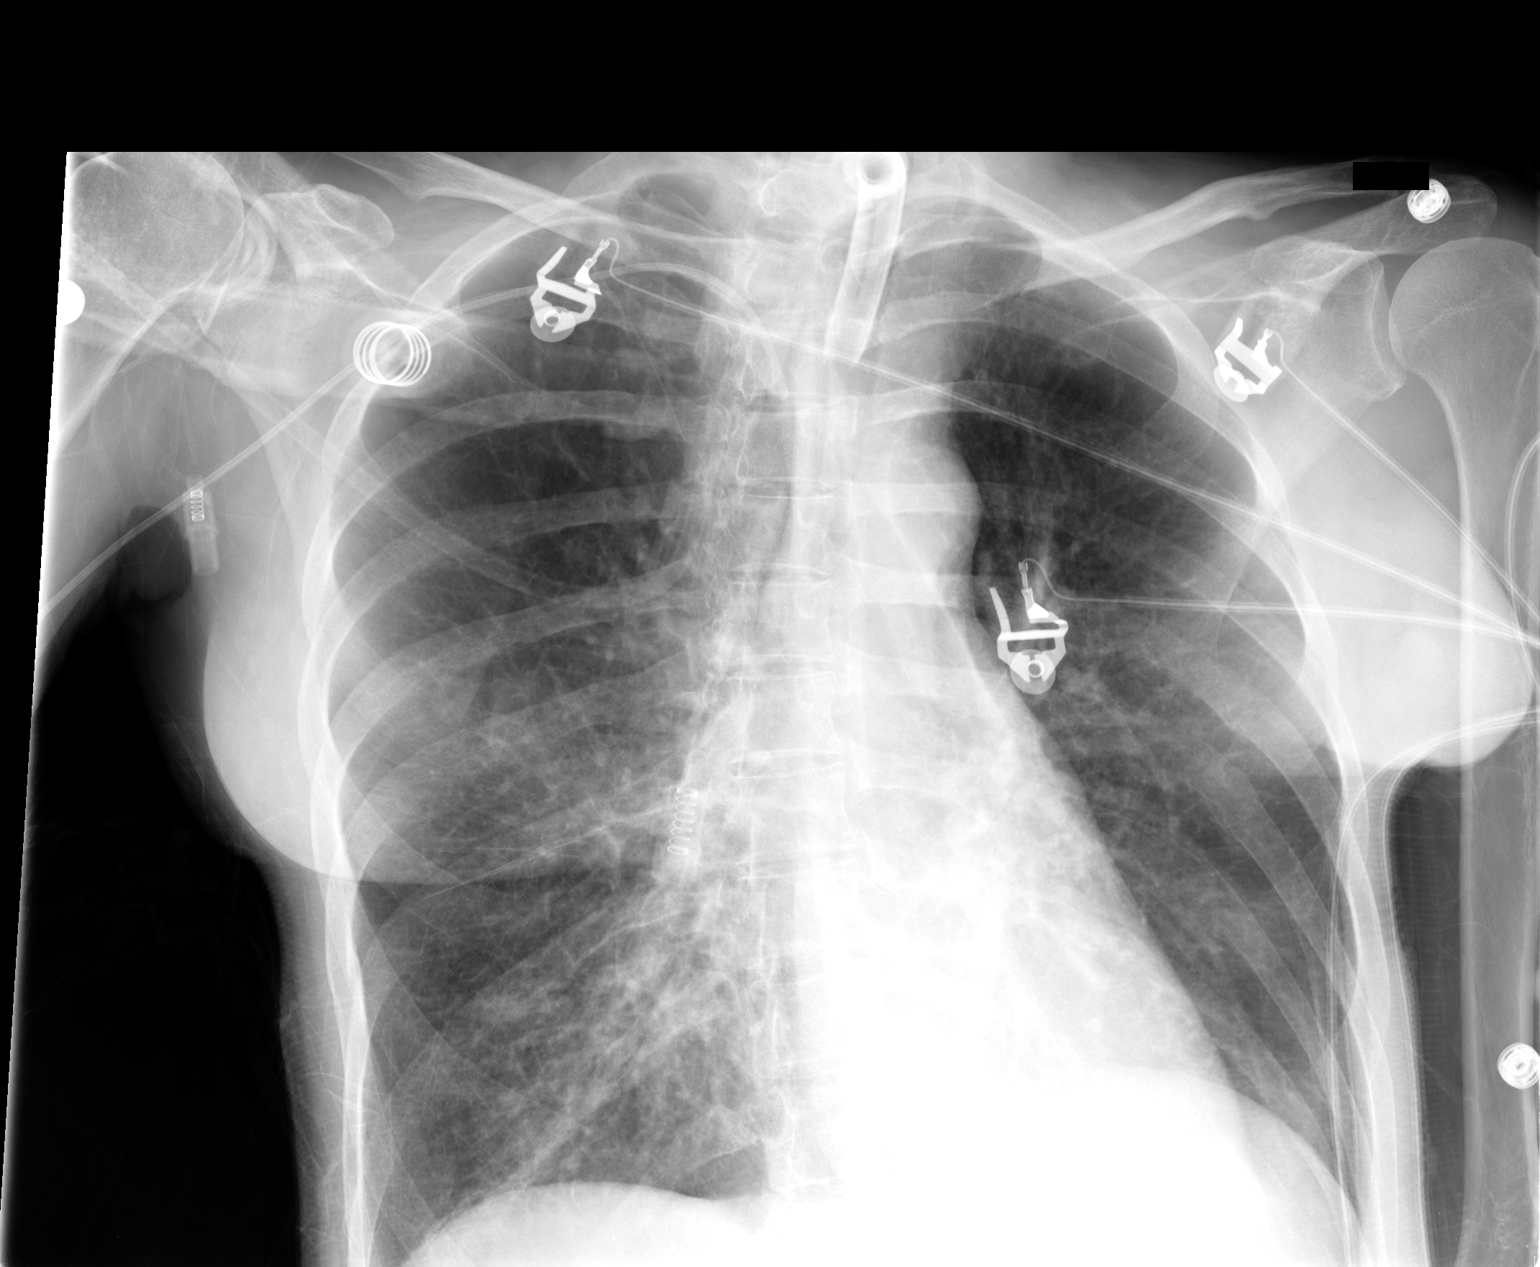

[1 of 1 positions shown; findings below may reference images not displayed]

FINDINGS: Tracheostomy tube and right-sided PICC line are stable.  Interval development of mild interstitial edema.  There is new infiltrate in the right lower lobe and right middle lobe.
IMPRESSION: 1.  New mild edema.  
 2.  Right lower lobe and right middle lobe infiltrate.

## 2006-06-12 IMAGING — CR DG CHEST 1V PORT
1 series · 1 of 1 positions shown · non-contrast
Comparison: Chest radiograph 09/14/05.

CLINICAL DATA: Intracranial hemorrhage, ventilated. 
PORTABLE CHEST - 1 VIEW ? 09/16/05:

[view not recorded]
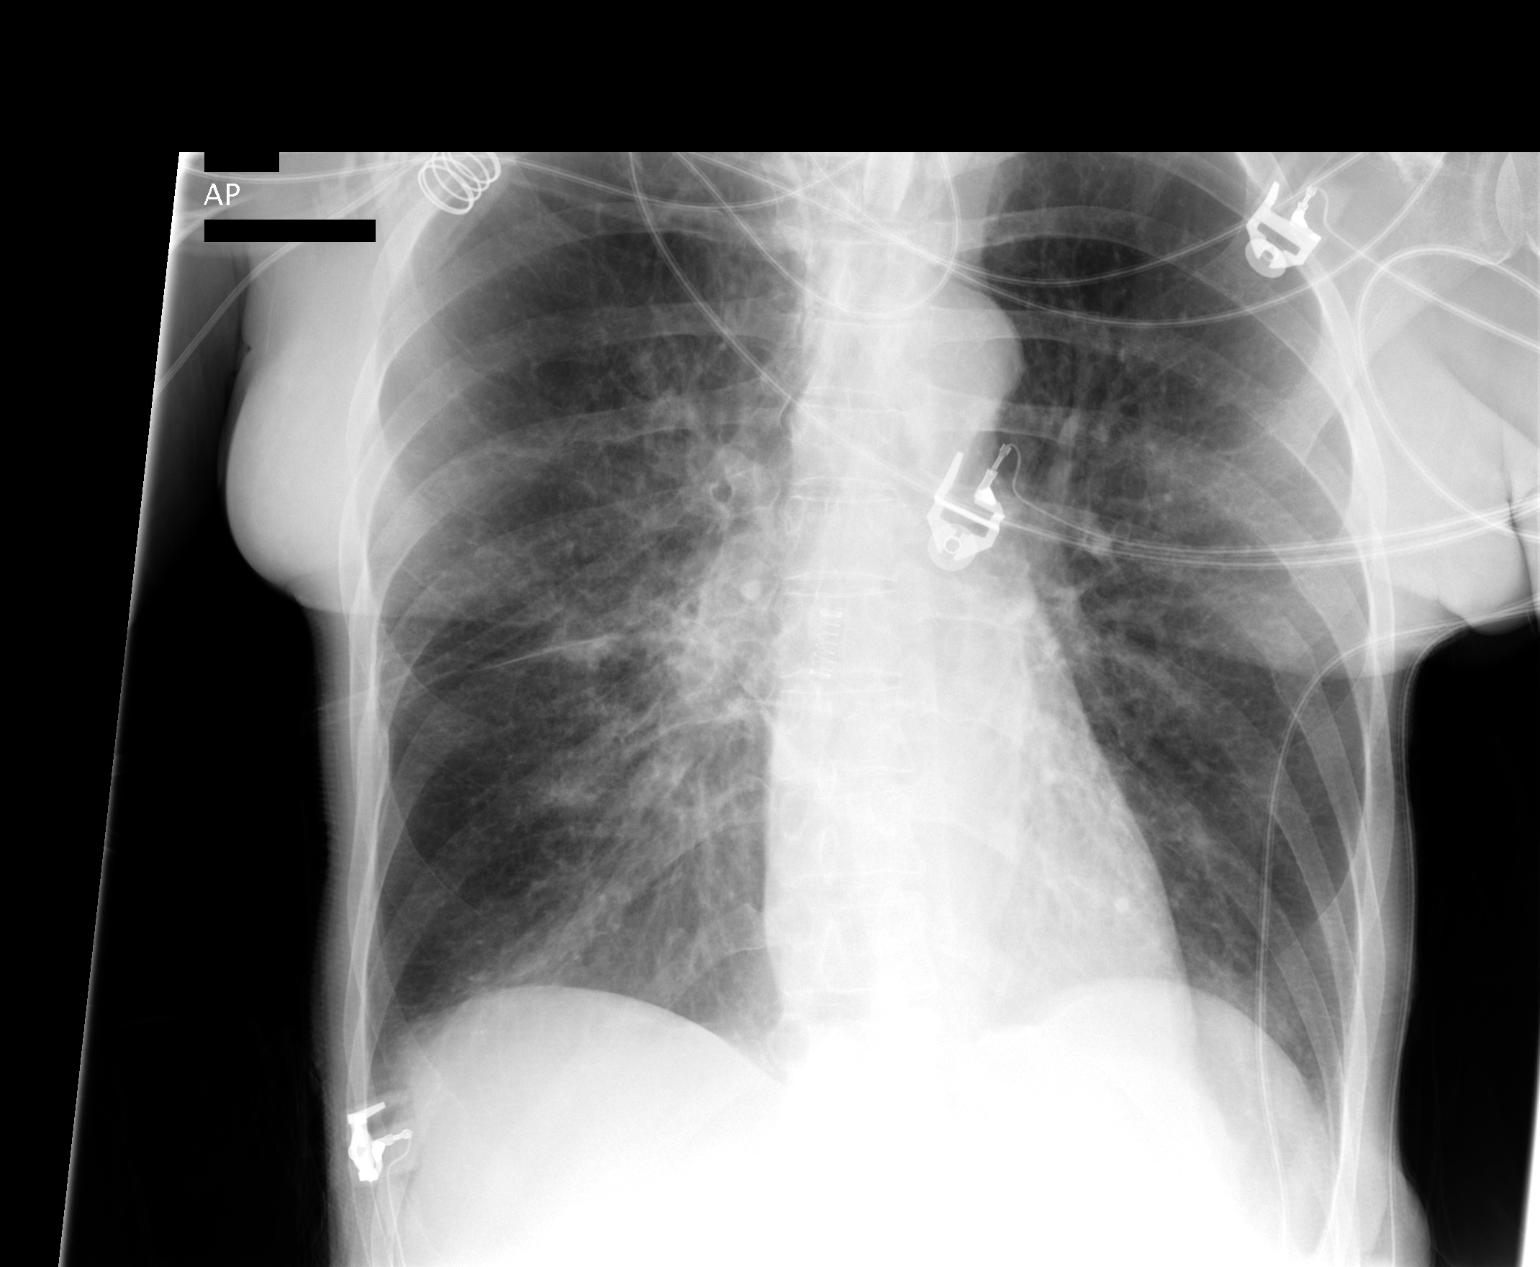

[1 of 1 positions shown; findings below may reference images not displayed]

Tracheostomy tube and right PICC line remain appropriately positioned.  Heart size is normal.  Patchy airspace opacity over the right lower lobe again noted.  Cardiac leads overlie the chest and obscure the right costophrenic angles.   Edema has improved since the prior study.
IMPRESSION: Stable patchy right lower lobe airspace opacities, consistent with pneumonia. Edema has improved.

## 2006-06-13 IMAGING — CT CT HEAD W/O CM
2 of 3 series · 14 of 30 positions shown, 18 images · non-contrast
Comparison: 09/05/05.
HEAD CT WITHOUT CONTRAST:

CLINICAL DATA: Intracranial hemorrhage.  Hydrocephalus.
TECHNIQUE: Contiguous axial images were obtained from the base of the skull through the vertex, according to standard protocol, without contrast.

[Series 2: (id) head 4.8 h45s st · axial · 0.45mm/px · z∈[-206,-56]mm · 13 of 36 slices shown, 17 images]
[im 3/36  brain]
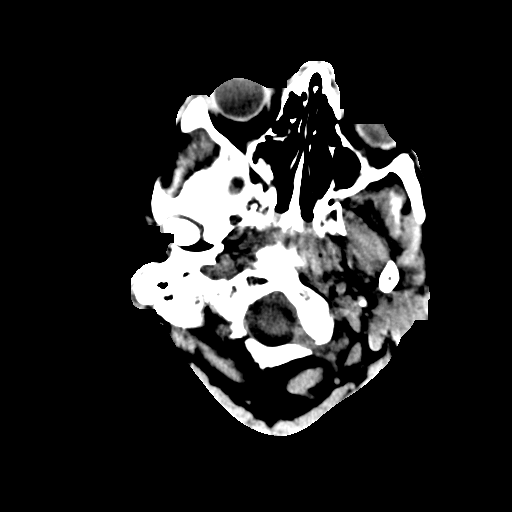
[im 3/36  bone]
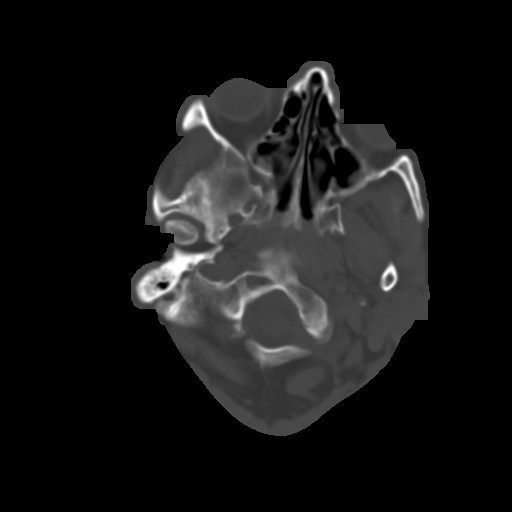
[im 6/36  brain]
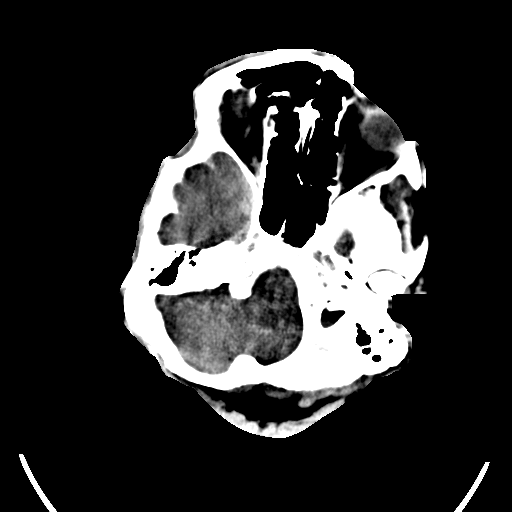
[im 8/36  brain]
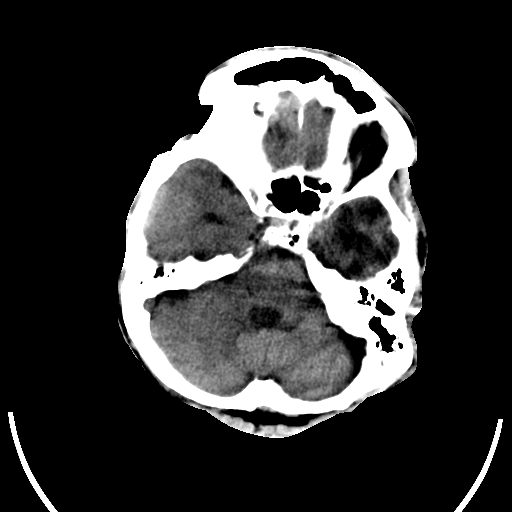
[im 11/36  brain]
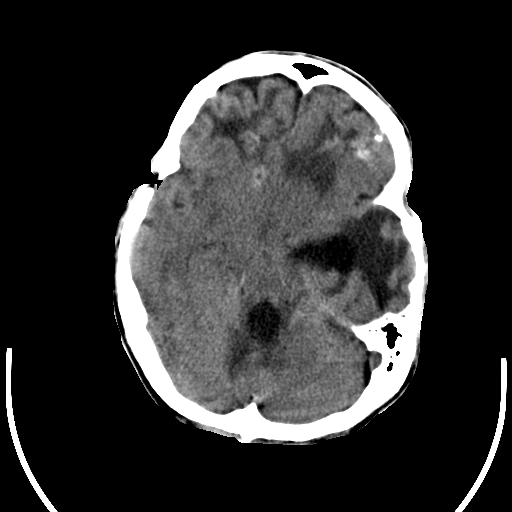
[im 13/36  brain]
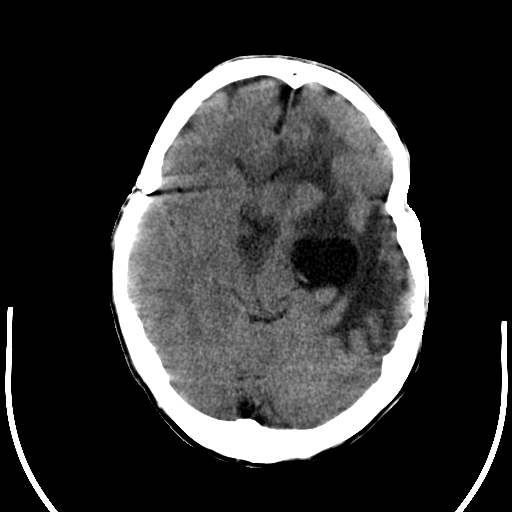
[im 13/36  bone]
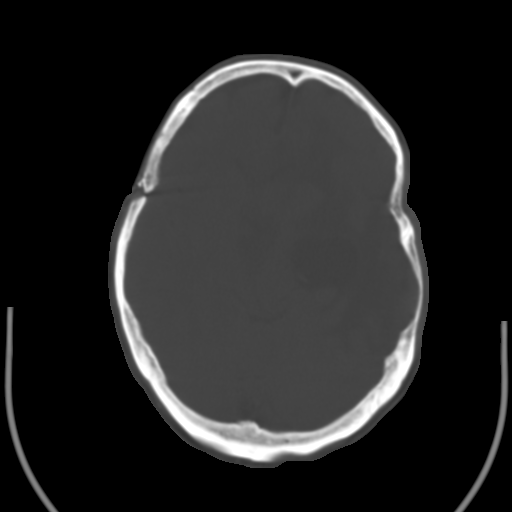
[im 16/36  brain]
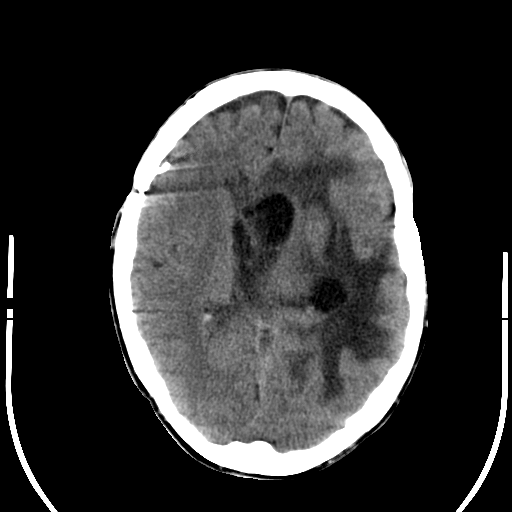
[im 18/36  brain]
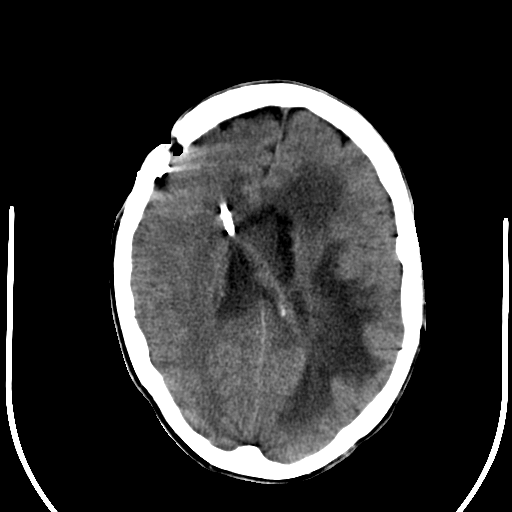
[im 21/36  brain]
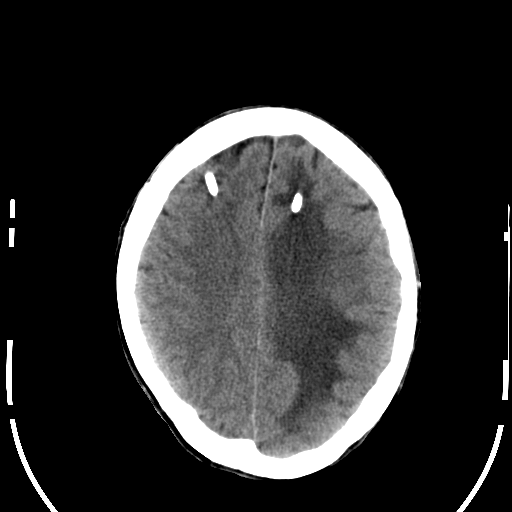
[im 23/36  brain]
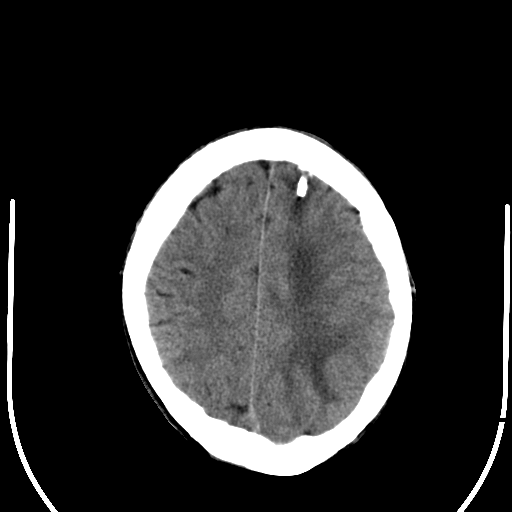
[im 23/36  bone]
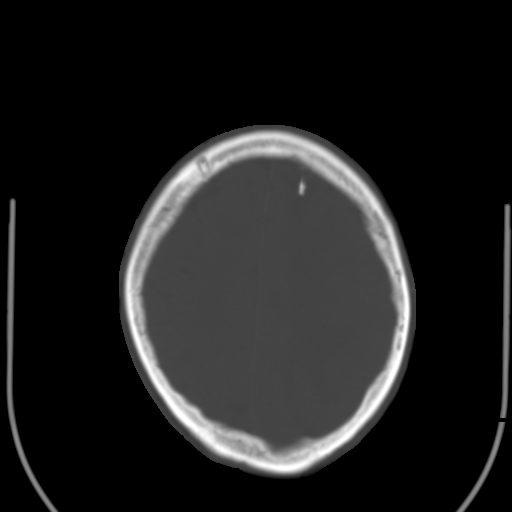
[im 26/36  brain]
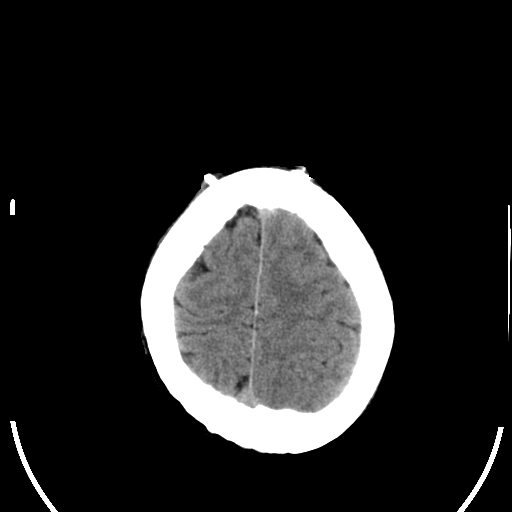
[im 28/36  brain]
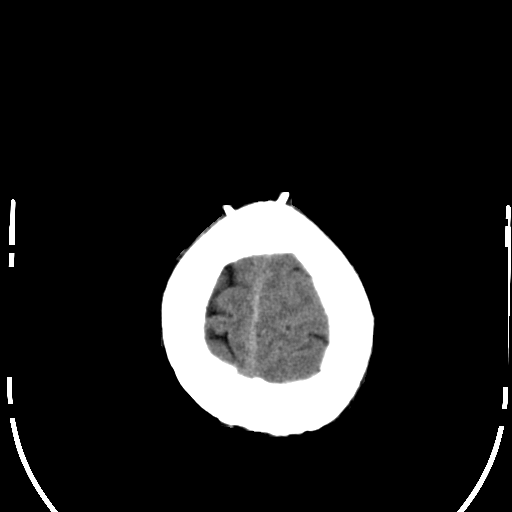
[im 31/36  brain]
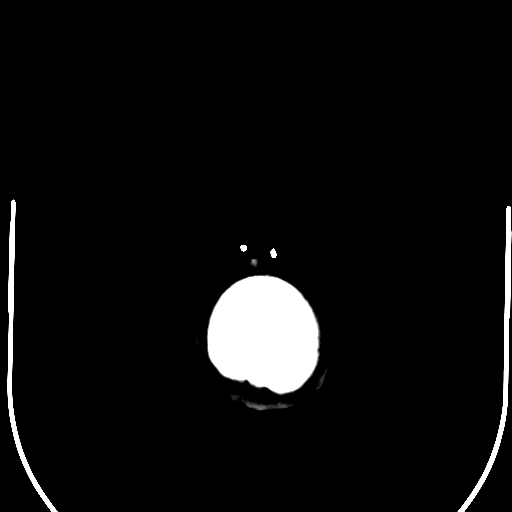
[im 33/36  brain]
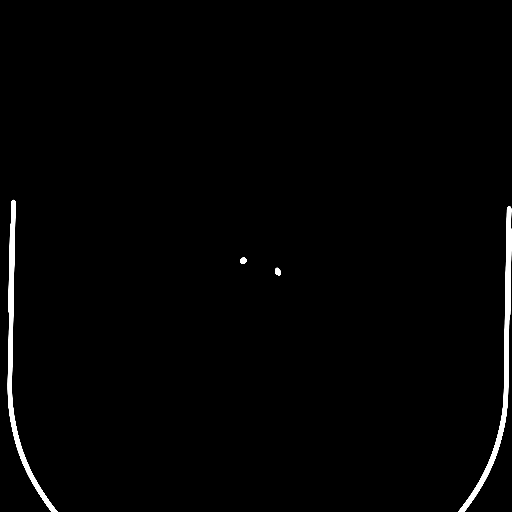
[im 33/36  bone]
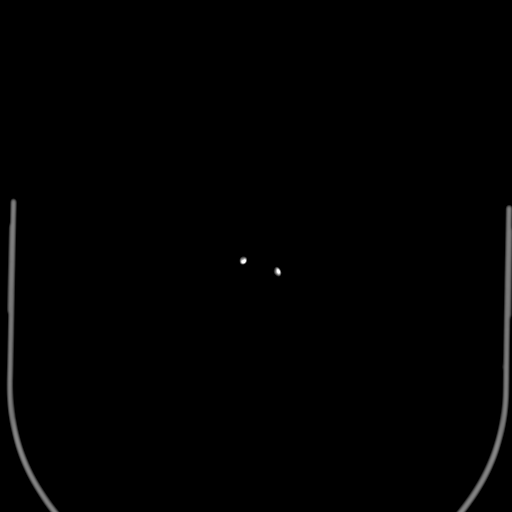

[Series 7: (id) head 2.4 h60s bone · axial · 0.45mm/px · 1 of 24 slices shown]
[im 3/24  bone]
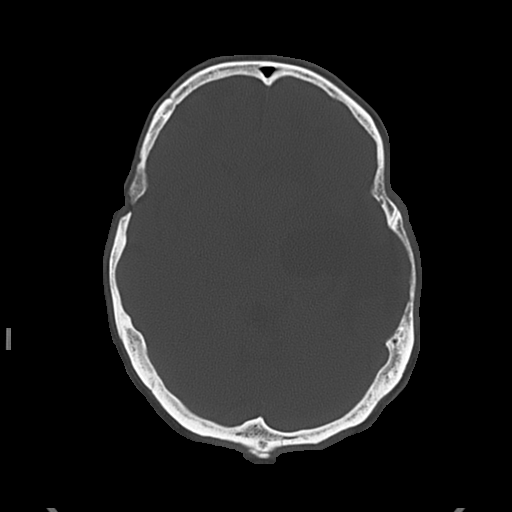

[14 of 30 positions shown; findings below may reference images not displayed]

FINDINGS: The patient has bifrontal ventriculostomy shunt catheters in place, each of which has been pulled back in the interval.  The tip of the right catheter may just extend into the right lateral ventricle near the atrium.  The tip of the left catheter projects outside the distorted lateral ventricle.  
Given the differential imaging planes, the bifrontal vasogenic edema does not appear substantially changed in the interval.  Similarly, the more diffuse vasogenic edema within the white matter of the left hemisphere also appears not substantially changed in the interval.  There is asymmetric distention of the lateral ventricles, left greater than right, which has not progressed substantially although the left lateral ventricle does appear to be slightly more distended than previously.  Left to right midline shift has increased slightly in the interval, measuring 12 mm today compared to 9 mm on the previous study.
The areas of bifrontal hemorrhage and/or calcification persist.  Enlargement of the fourth ventricle is stable.  No evidence for interval rebleeding.
IMPRESSION: 1.  Both ventriculostomy shunt catheters appear to have been pulled back in the interval.  The tip of the right catheter may just barely be into the right lateral ventricle and the tip of the left catheter projects outside the frontal horn of the left lateral ventricle.  
2.  Slight interval increase in size of the left lateral ventricle, with a distorted midline anatomy persisting. The degree of bifrontal and left hemispheric vasogenic edema does not appear substantially changed in the interval although left to right midline shift has progressed slightly since the previous study.  
3.  Stable enlargement of the fourth ventricle.
4.  No evidence for interval acute bleeding.

## 2006-06-16 IMAGING — CT CT HEAD W/O CM
1 of 2 series · 15 of 30 positions shown, 19 images · IV contrast (agent unspecified)
Comparison: none

CLINICAL DATA: Hydrocephalus.  Intracranial hemorrhage.  
HEAD CT WITHOUT CONTRAST:
TECHNIQUE: Contiguous axial images were obtained from the base of the skull through the vertex, according to standard protocol, without contrast.

[Series 2: routinehead 4.8 h45s · axial · 0.42mm/px · z∈[-162,-12]mm · 15 of 35 slices shown, 19 images]
[im 2/35  brain]
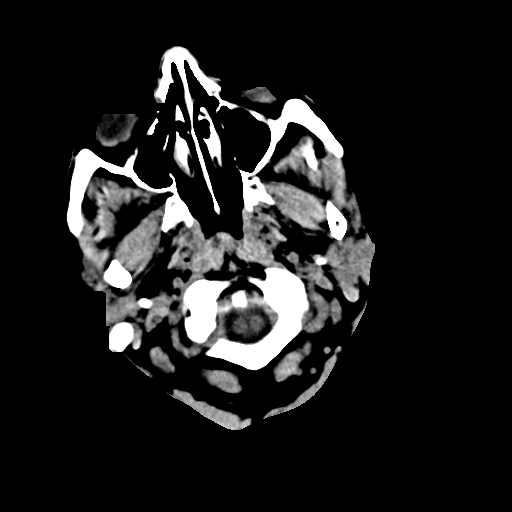
[im 2/35  bone]
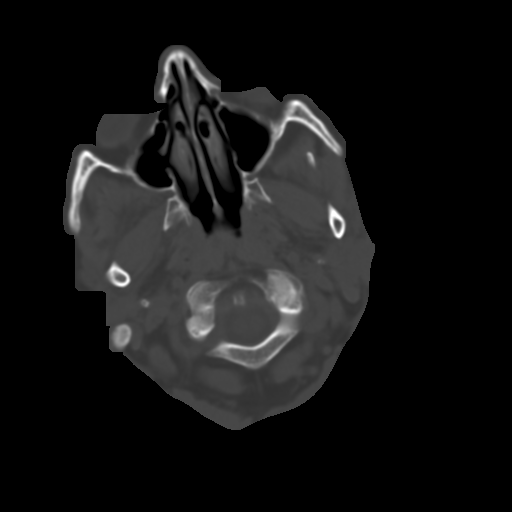
[im 5/35  brain]
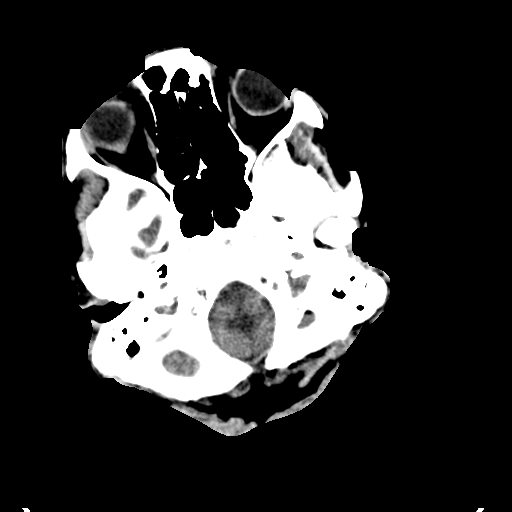
[im 7/35  brain]
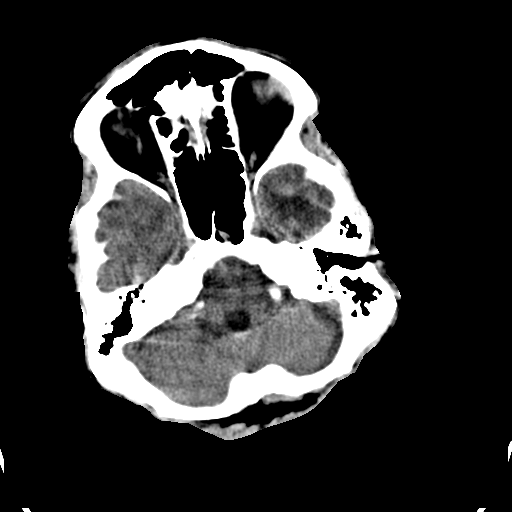
[im 9/35  brain]
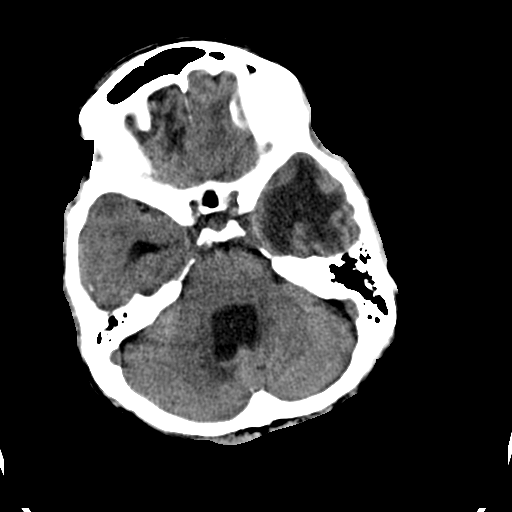
[im 12/35  brain]
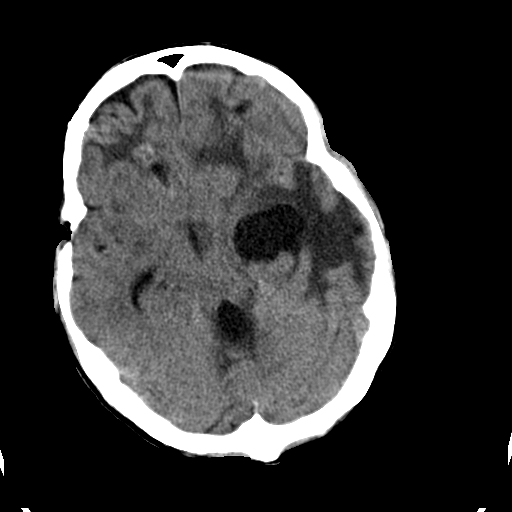
[im 12/35  bone]
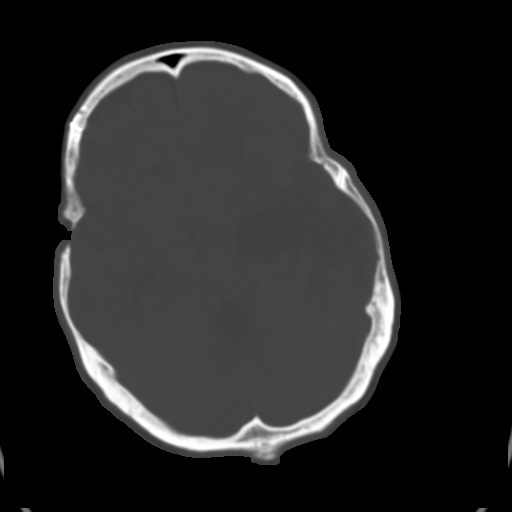
[im 13/35  brain]
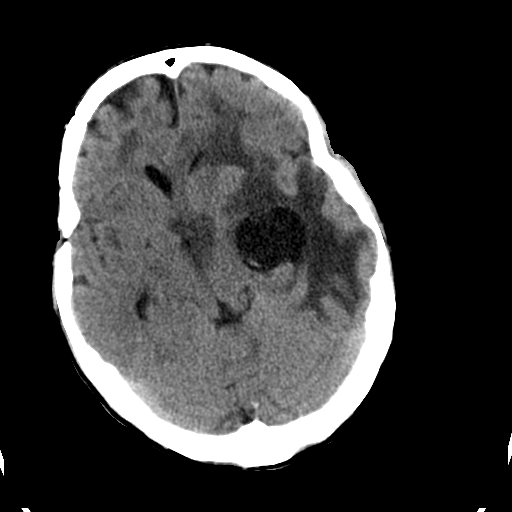
[im 15/35  brain]
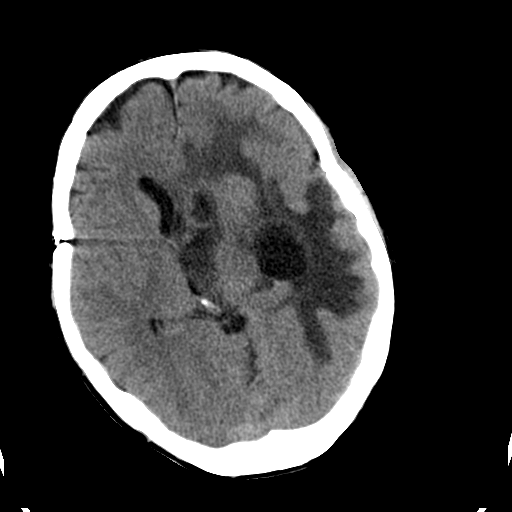
[im 18/35  brain]
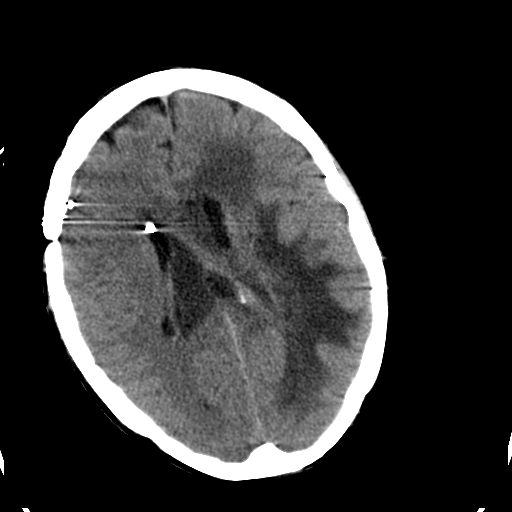
[im 20/35  brain]
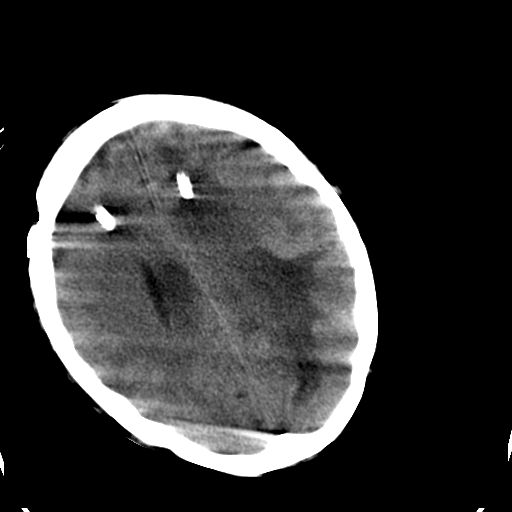
[im 20/35  bone]
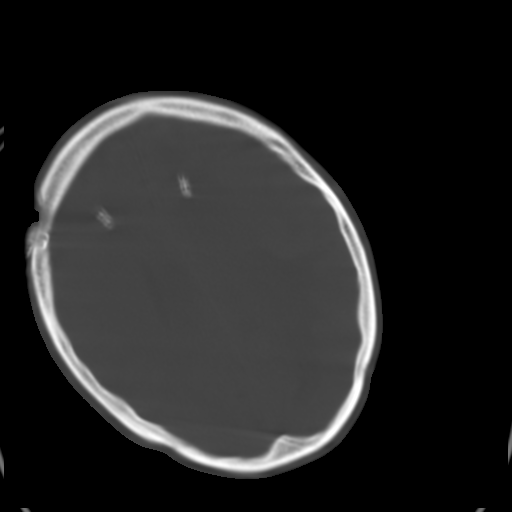
[im 22/35  brain]
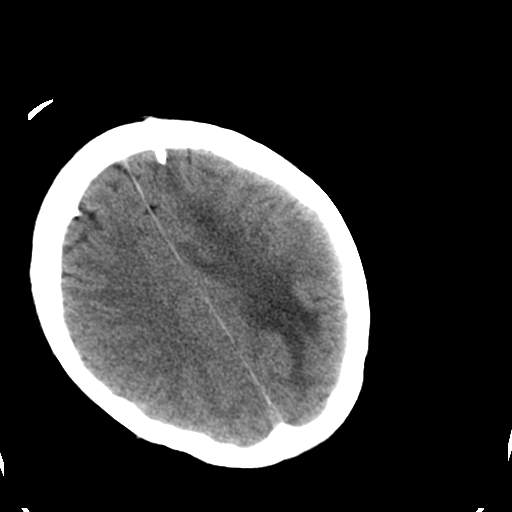
[im 25/35  brain]
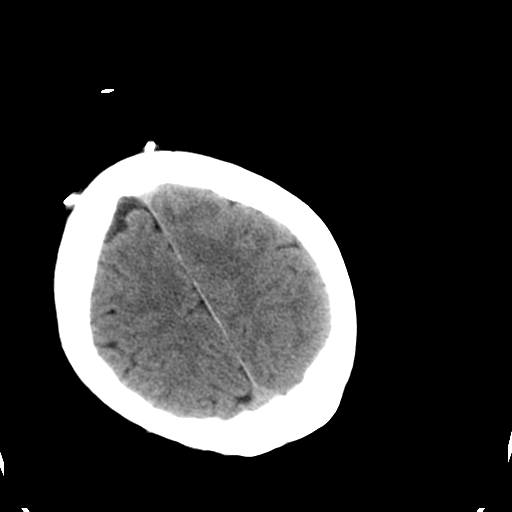
[im 26/35  brain]
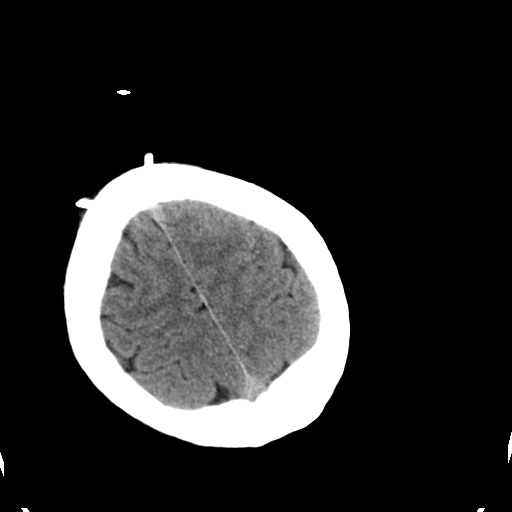
[im 28/35  brain]
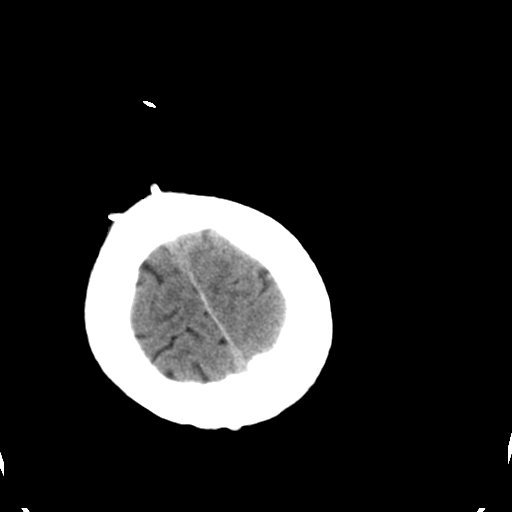
[im 28/35  bone]
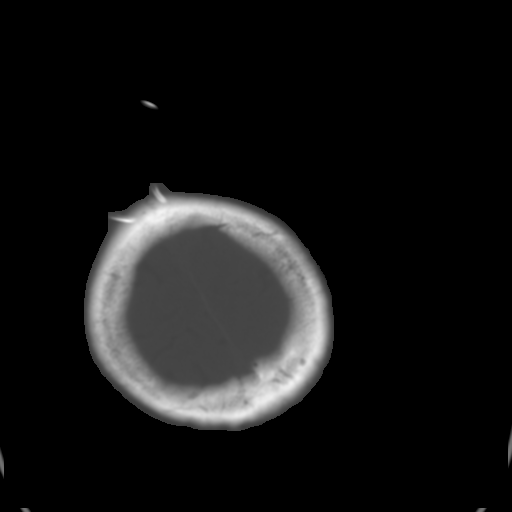
[im 31/35  brain]
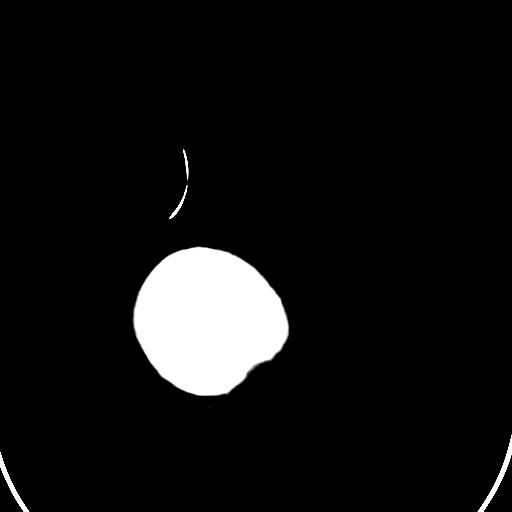
[im 33/35  brain]
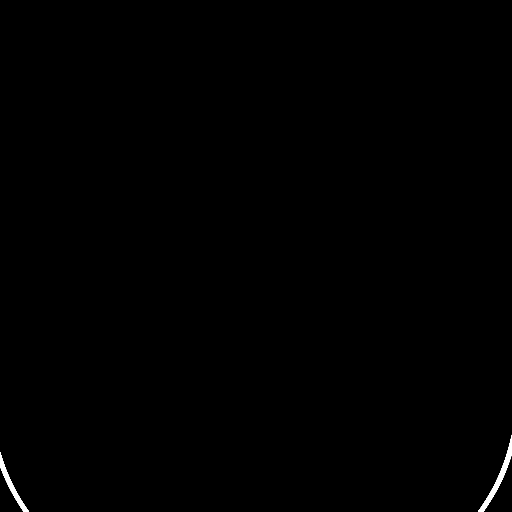

[15 of 30 positions shown; findings below may reference images not displayed]

FINDINGS: There has been slight interval increase in size of the right lateral ventricle in the interval.  The left lateral ventricle is distorted with apparent cystic change in the atrium.  The frontal horn of the left lateral ventricle appears decompressed and the paramidline more focal cystic component also appears smaller.  The temporal tip of the left lateral ventricle is unchanged and may be trapped.  The extensive bifrontal encephalomalacia and diffuse edema in the left temporal lobe is unchanged.  No change in the bifrontal calcification.
IMPRESSION: Slight increase in size of the right lateral ventricle, with interval further decompression of the paramidline aspect of the left lateral ventricle.  Midline shift is decreased to 8 mm.  The temporal tip of the left lateral ventricle is unchanged and shows no interval decompression, raising the consideration that it is trapped.  Continued attention on follow-up exams is recommended.

## 2006-06-19 IMAGING — CT CT HEAD W/O CM
1 of 2 series · 13 of 30 positions shown, 17 images · IV contrast (agent unspecified)
Comparison: 09/20/05.

CLINICAL DATA: Seizure activity.  Bilateral drains not working.  History of hydrocephalus and intracerebral bleed. 
HEAD CT WITHOUT CONTRAST:
TECHNIQUE: Contiguous axial images were obtained from the base of the skull through the vertex according to standard protocol without contrast.

[Series 2: routinehead 4.8 h45s · axial · 0.39mm/px · z∈[-143,-12]mm · 13 of 33 slices shown, 17 images]
[im 3/33  brain]
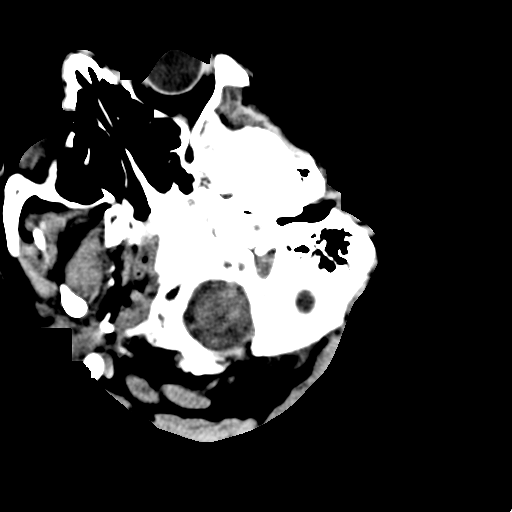
[im 3/33  bone]
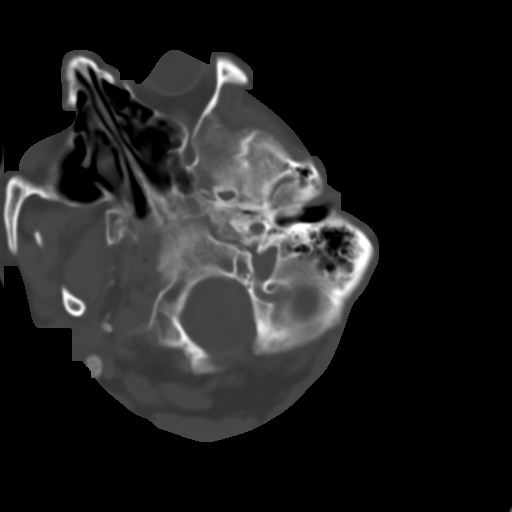
[im 5/33  brain]
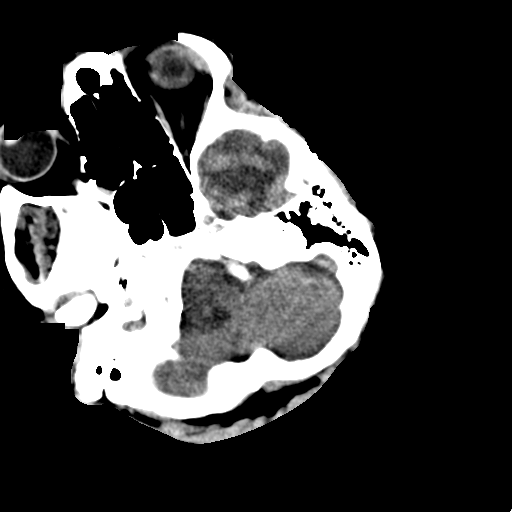
[im 7/33  brain]
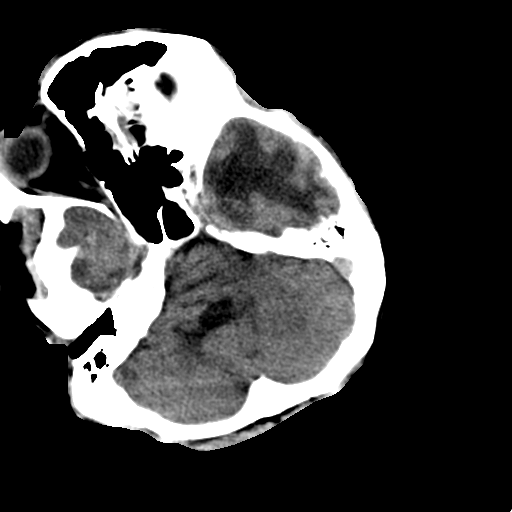
[im 10/33  brain]
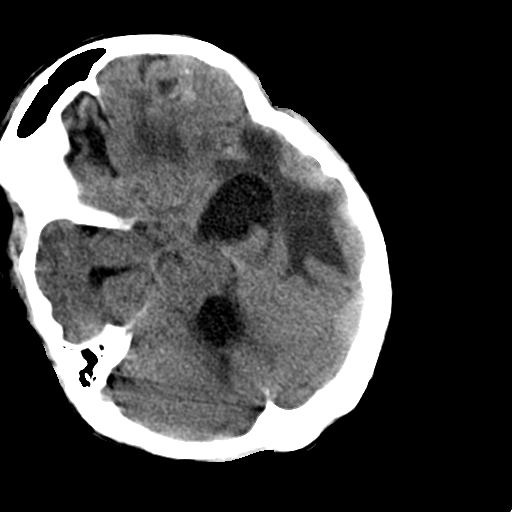
[im 12/33  brain]
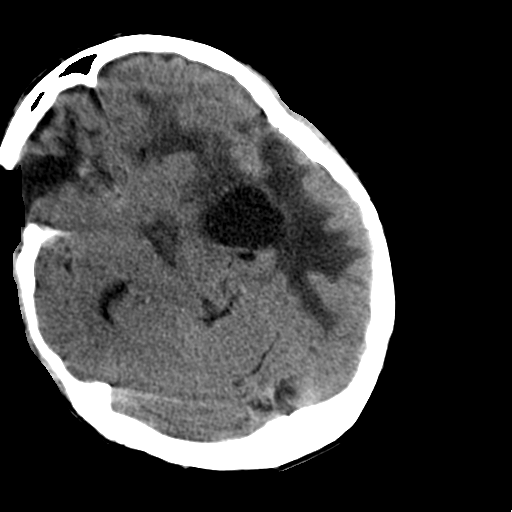
[im 12/33  bone]
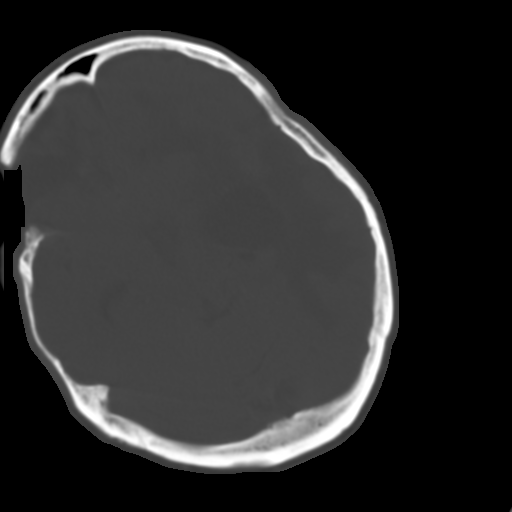
[im 14/33  brain]
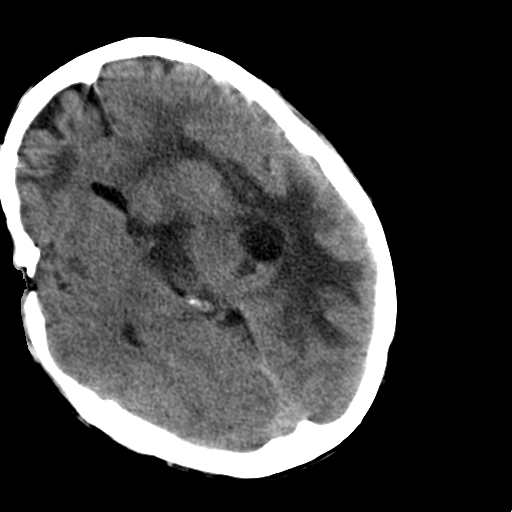
[im 17/33  brain]
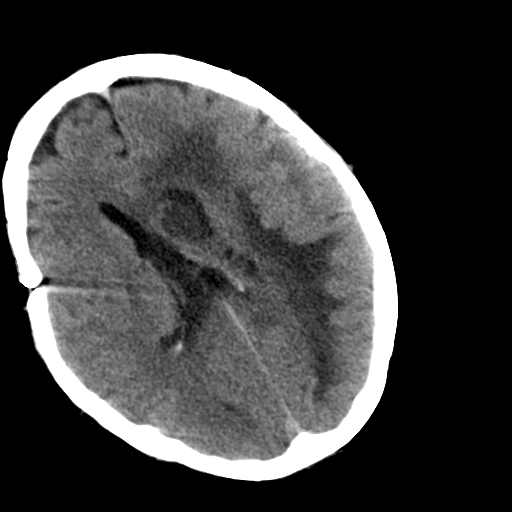
[im 19/33  brain]
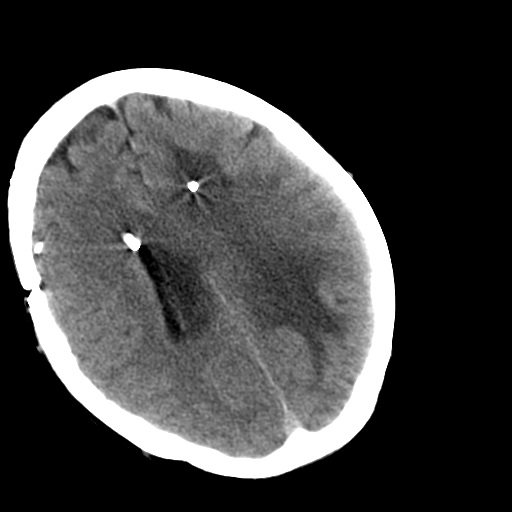
[im 21/33  brain]
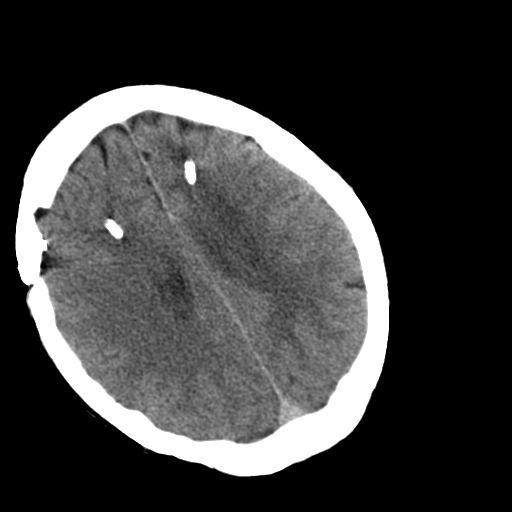
[im 21/33  bone]
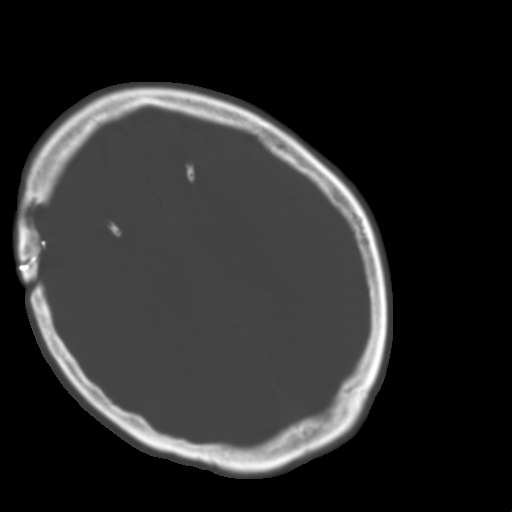
[im 23/33  brain]
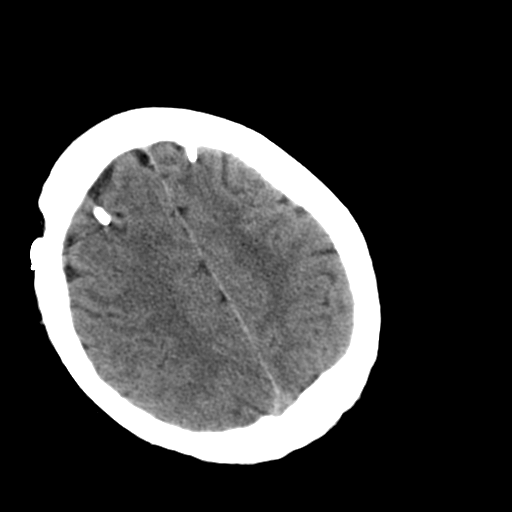
[im 26/33  brain]
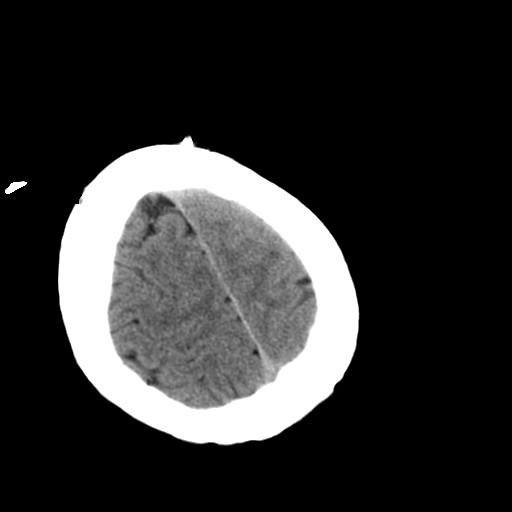
[im 28/33  brain]
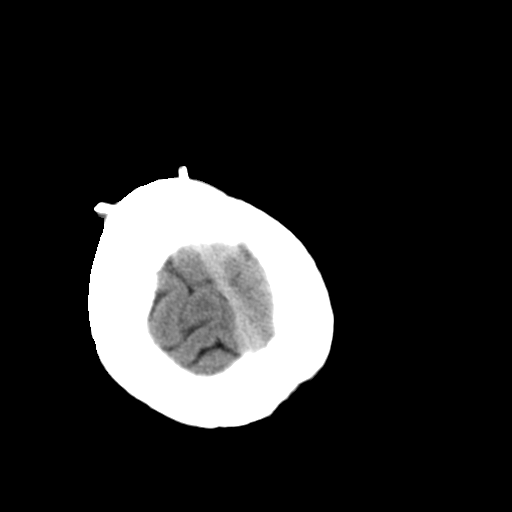
[im 30/33  brain]
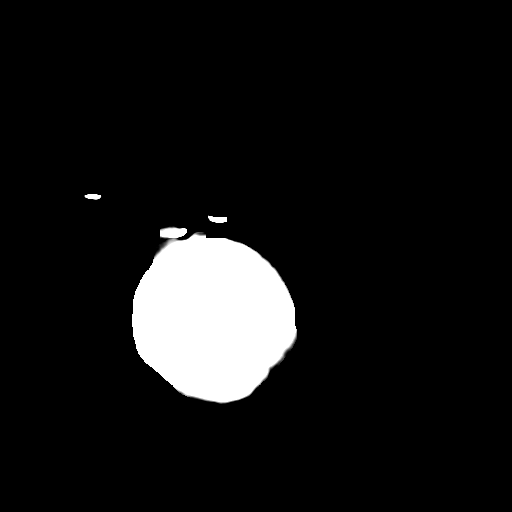
[im 30/33  bone]
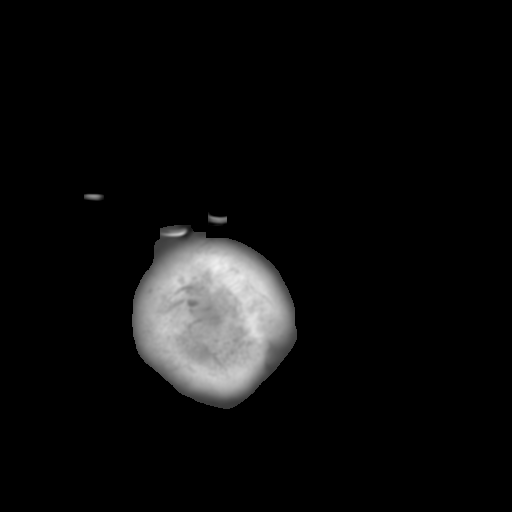

[13 of 30 positions shown; findings below may reference images not displayed]

FINDINGS: Bone windows demonstrate prior right frontal craniotomy.  Minimal fluid in left mastoid air cells, image 5.  Intracranial imaging demonstrates bilateral frontal ventriculostomy catheters, which terminate over the anterior horns of the lateral ventricles bilaterally.  The position is similar to on the prior. 
 there is extensive left temporal and parietal vasogenic edema.  There are foci of increased density within bilateral frontal lobes, greater left than right.  These may represent calcification or hemorrhage.  They are slightly decreased since the prior exam. 
The right lateral ventricle is stable in size.  There is stable prominent of the temporal horn of the right lateral ventricle.  The fourth ventricle is stable to minimally decreased in size. 
Focal dilatation of the temporal horn at the left lateral ventricle is similar to slightly increased, measuring 3.1 cm thick compared to 2.9 previously.  The body of the lateral ventricle appears similar. There is focal dilatation versus cystic change involving the anterior horn of the left lateral ventricle, which today measures 1.7 cm in ML dimension compared to 1.9 cm previously.  There is minimal decrease in left to right midline shift of 7 mm (8 mm previously).
IMPRESSION: 1.  Stable appearance of the body of the left lateral ventricle with minimal increase in temporal horn enlargement.    Findings again could relate to entrapped temporal horn on the left. 
2.  Amount of left to right midline shift is minimally decreased. 
3.  Right lateral ventricle is stably enlarged. 
4.  Extensive vasogenic edema with decreased blood or calcification in the frontal hemispheres bilaterally. 
5.  Minimal left-sided mastoiditis.

## 2006-06-20 IMAGING — CT CT HEAD W/O CM
3 series · 17 of 30 positions shown, 19 images · non-contrast
Comparison: Yesterday.

CLINICAL DATA: Followup intracranial hemorrhage and hydrocephalus.

HEAD CT WITHOUT CONTRAST
TECHNIQUE: 5mm collimated images were obtained from the base of the skull
through the vertex according to standard protocol without contrast.

[Series 2: head-trauma 4.8 h45s st · axial · 0.39mm/px · z∈[-122,-6]mm · 5 of 36 slices shown, 7 images]
[im 6/36  brain]
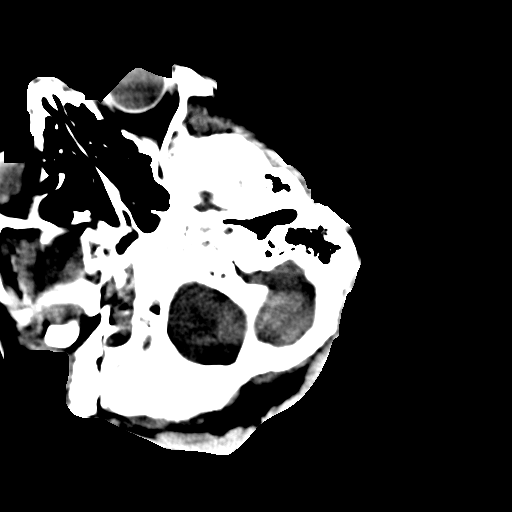
[im 6/36  bone]
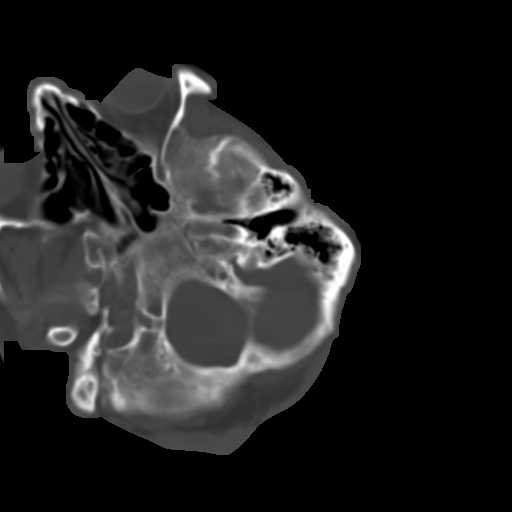
[im 12/36  brain]
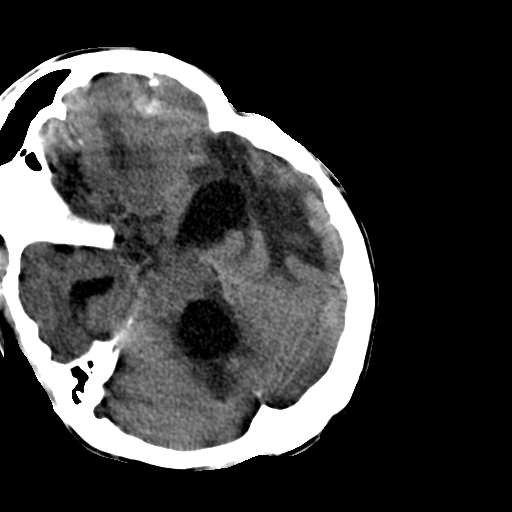
[im 18/36  brain]
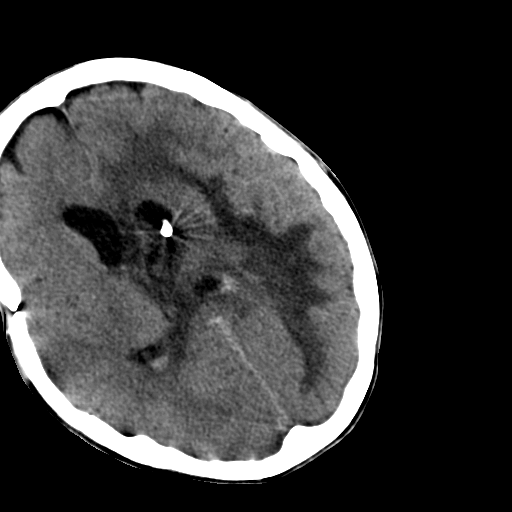
[im 24/36  brain]
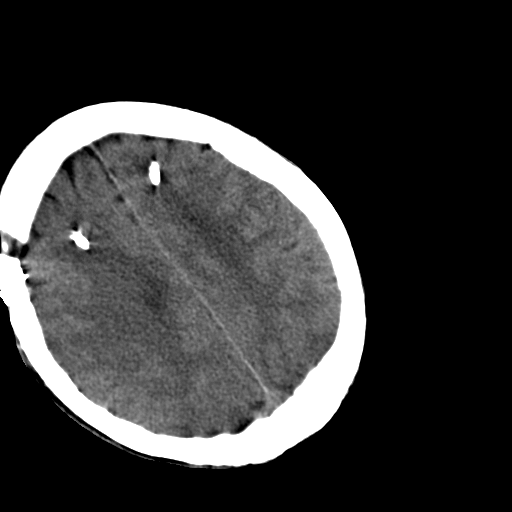
[im 30/36  brain]
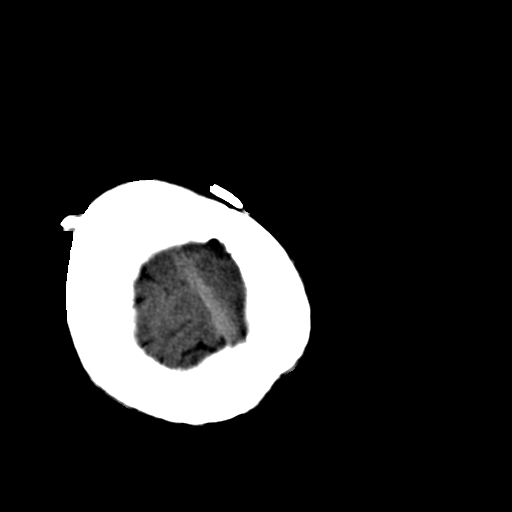
[im 30/36  bone]
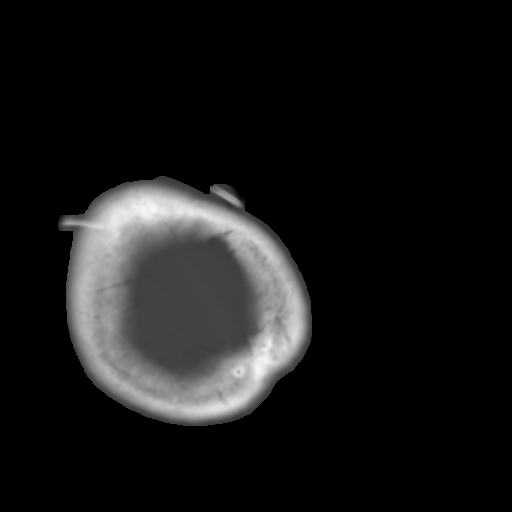

[Series 3: head-trauma 4.8 h60s bone · axial · 0.54mm/px · z∈[-115,-42]mm · 4 of 36 slices shown]
[im 6/36  bone]
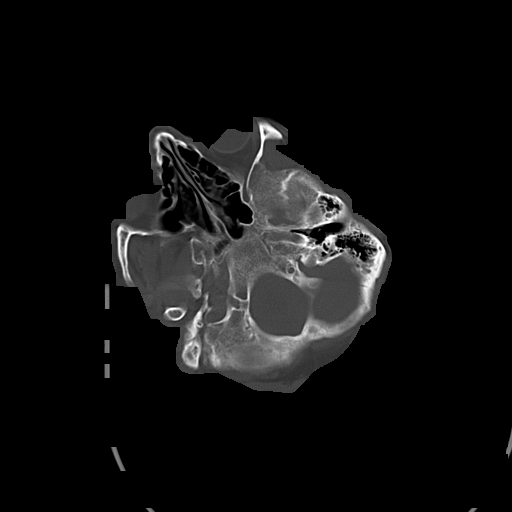
[im 11/36  bone]
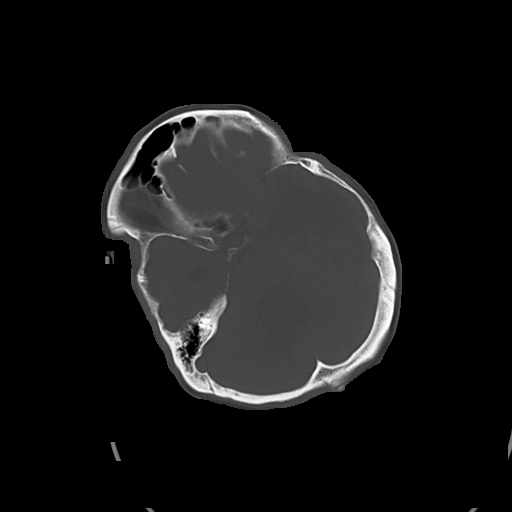
[im 16/36  bone]
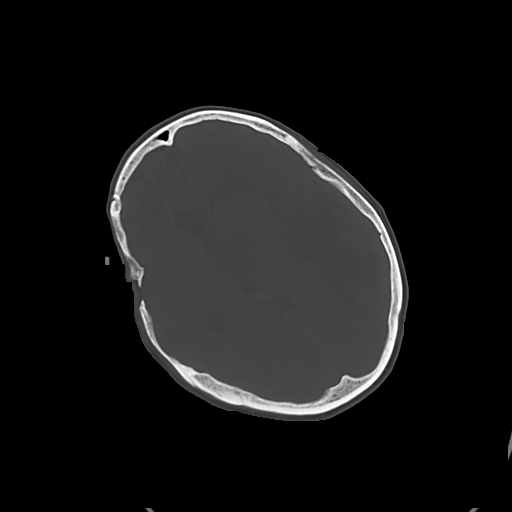
[im 21/36  bone]
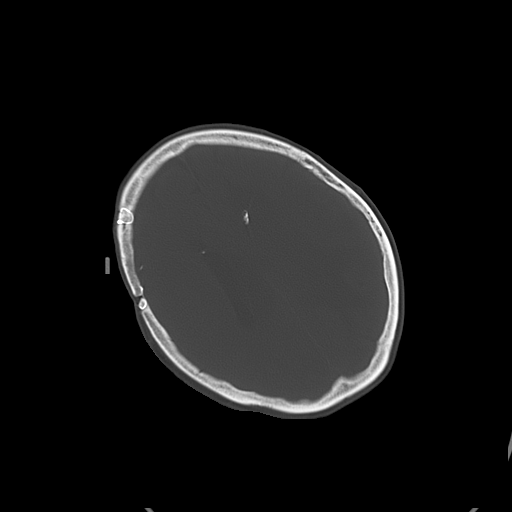

[Series 4: head-trauma 2.4 h60s bone · axial · 0.54mm/px · z∈[-128,+17]mm · 8 of 72 slices shown]
[im 6/72  bone]
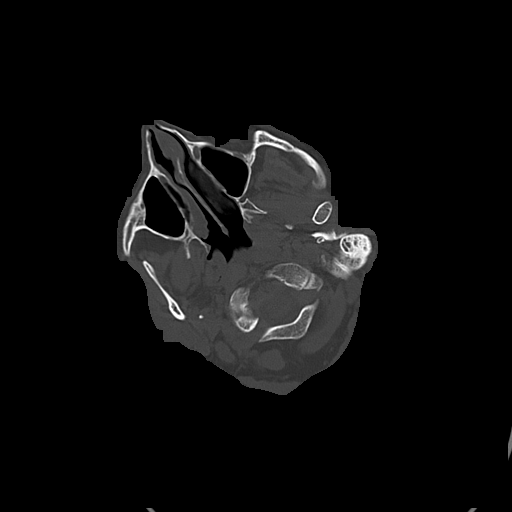
[im 16/72  bone]
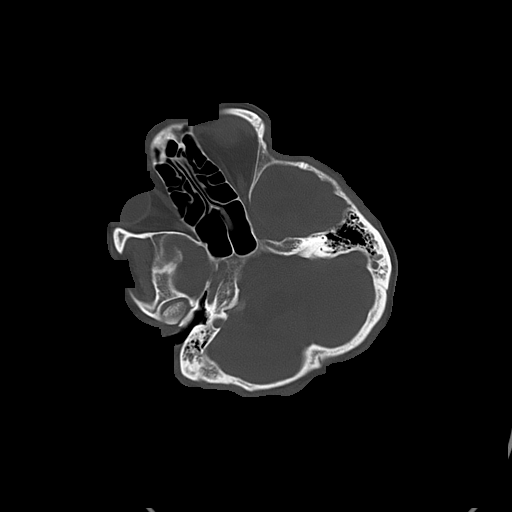
[im 26/72  bone]
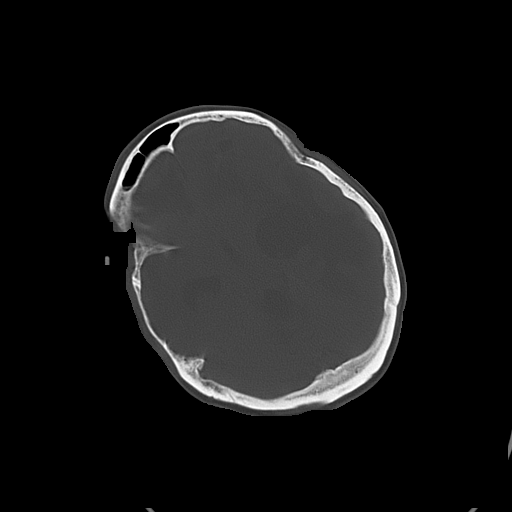
[im 31/72  bone]
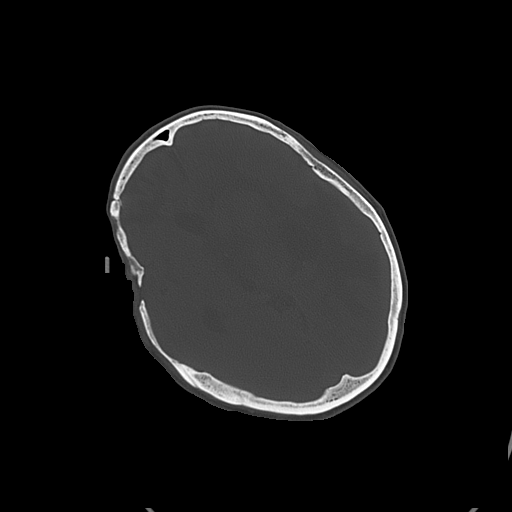
[im 41/72  bone]
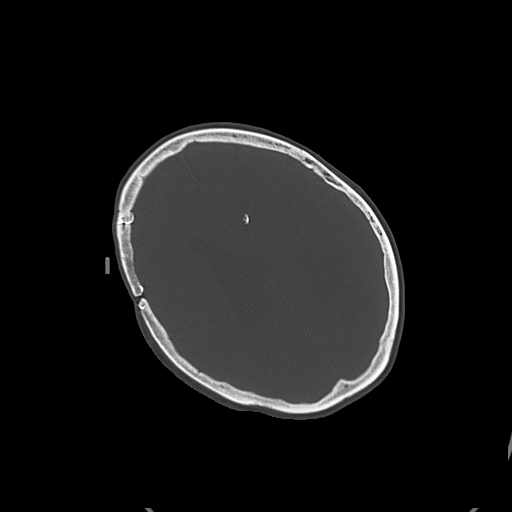
[im 46/72  bone]
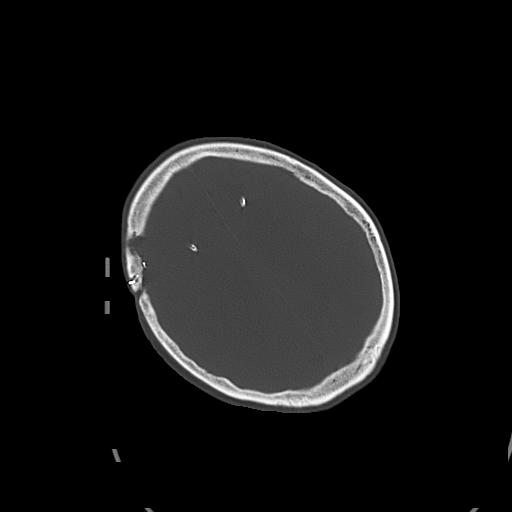
[im 56/72  bone]
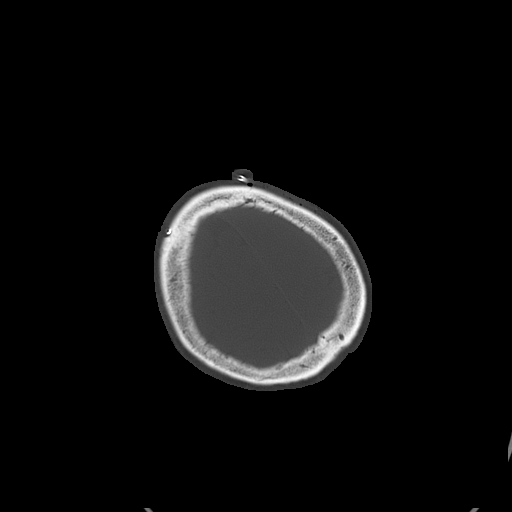
[im 66/72  bone]
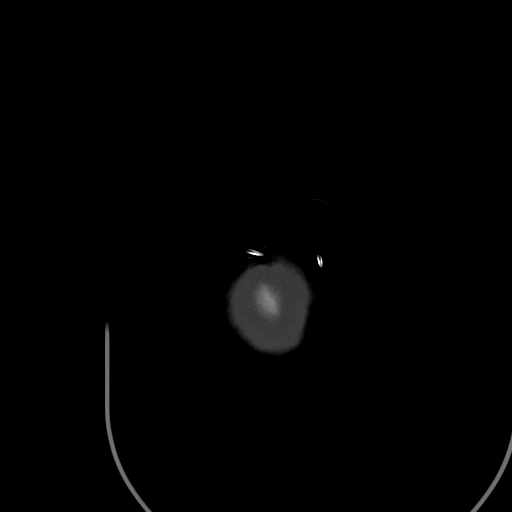

[17 of 30 positions shown; findings below may reference images not displayed]

FINDINGS: There is slightly less shift of midline structures from right to
left, currently measuring 6 mm at the location where it measured 7 mm yesterday.
Continued architectural distortion and edema in the left cerebral hemisphere.
The temporal horn of the left lateral ventricle remains disproportionately
enlarged, currently measuring 3.3 cm in maximum transverse diameter and
previously measuring 3.1 cm in maximum transverse diameter. Also noted is
progressive enlargement of the fourth ventricle, currently measuring 2.5 cm in
maximum transverse diameter and previously measuring 1.8 cm in maximum
transverse diameter. There has been an increase in size of the right lateral
ventricle, currently measuring 1.1 cm in transverse diameter and previously
measuring 0.6 cm in transverse diameter. Bilateral frontal ventriculostomy
catheters are unchanged.  Edema in the medial aspect of the right frontoparietal
region is unchanged as are post craniotomy changes on the right. Stable right
frontal encephalomalacia and small amount of hemorrhage and/or calcification in
both frontal regions. Interval small amount of bilateral sphenoid sinus mucosal
thickening.

IMPRESSION

1. Progressive hydrocephalus.
2. Minimally decreased shift of midline structures from left-to-right.
3. Stable edema in both cerebral hemispheres, remaining greater on the left.

## 2006-06-20 IMAGING — CR DG CHEST 1V PORT
1 series · 1 of 1 positions shown · non-contrast
Comparison: 09/16/2005

CLINICAL DATA: Respiratory distress

PORTABLE CHEST - 1 VIEW:

[view not recorded]
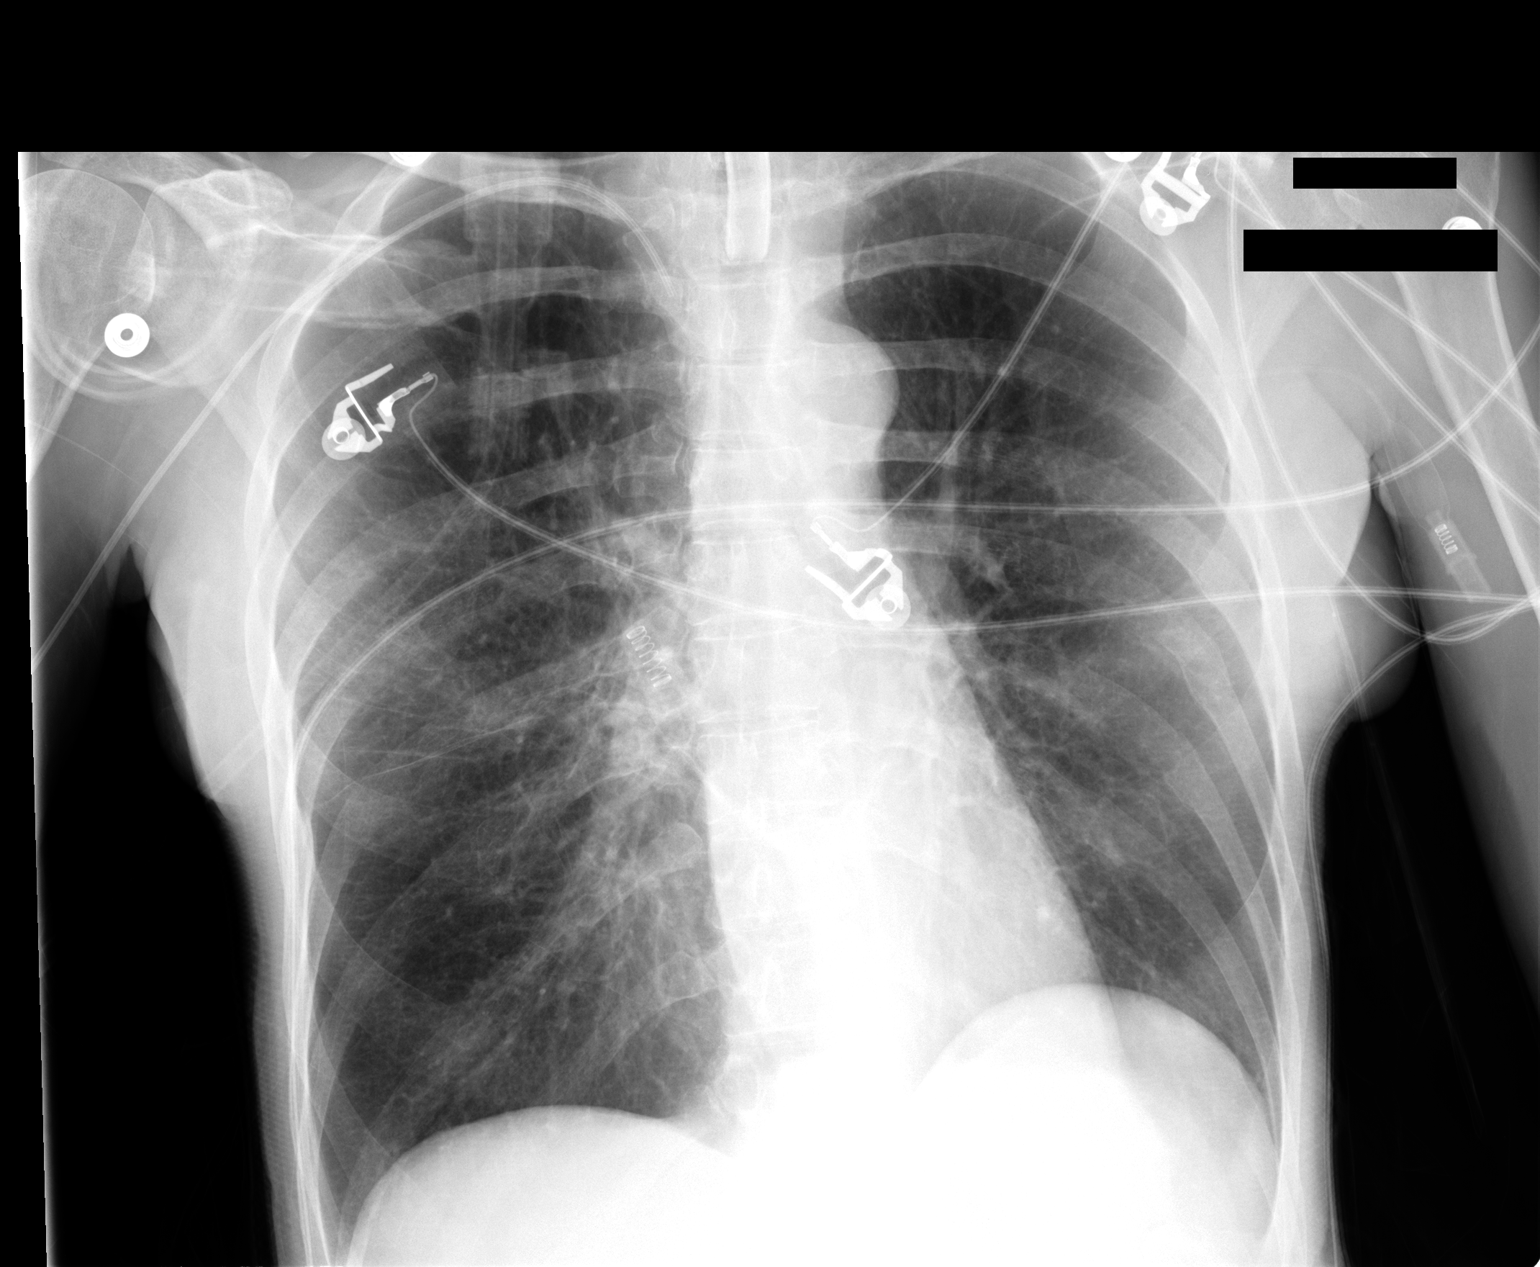

[1 of 1 positions shown; findings below may reference images not displayed]

FINDINGS: Support devices are stable. There are COPD changes noted. Improving
right lower lobe airspace disease. Heart is normal size. No focal opacity in the
left.
IMPRESSION: Improving right lower lobe opacity. COPD.

## 2006-06-22 IMAGING — CT CT HEAD W/O CM
1 of 2 series · 16 of 30 positions shown, 20 images · non-contrast
Comparison: 09/24/2005

CLINICAL DATA: Hydrocephalus, intracranial hemorrhage

HEAD CT WITHOUT CONTRAST
TECHNIQUE: 5mm collimated images were obtained from the base of the skull
through the vertex according to standard protocol without contrast.

[Series 2: headseq 4.8 h45s · axial · 0.41mm/px · z∈[-122,+18]mm · 16 of 33 slices shown, 20 images]
[im 2/33  brain]
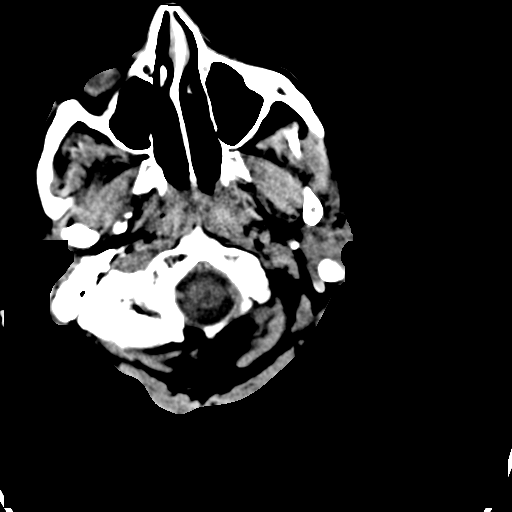
[im 2/33  bone]
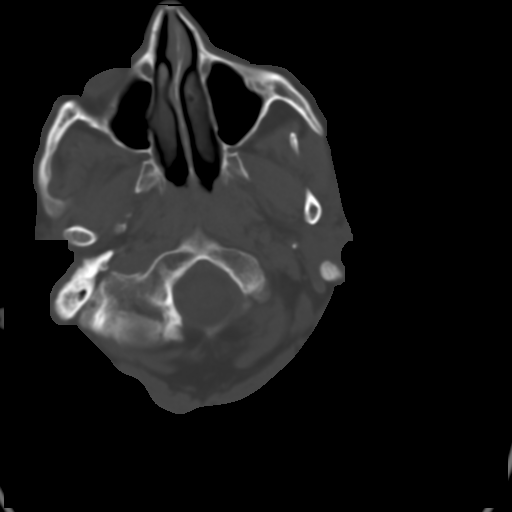
[im 4/33  brain]
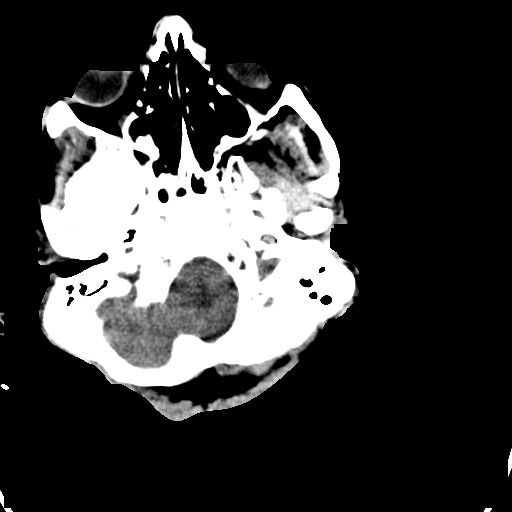
[im 5/33  brain]
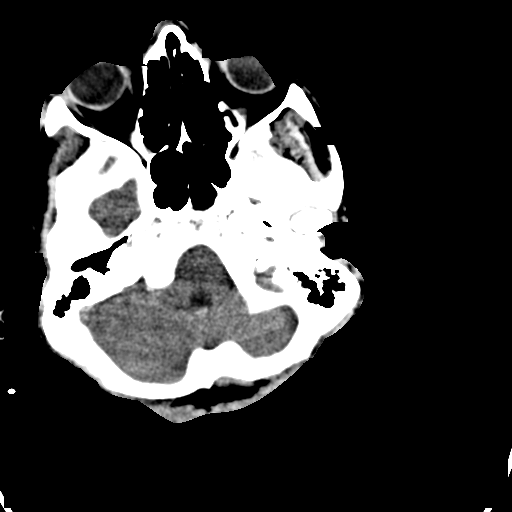
[im 9/33  brain]
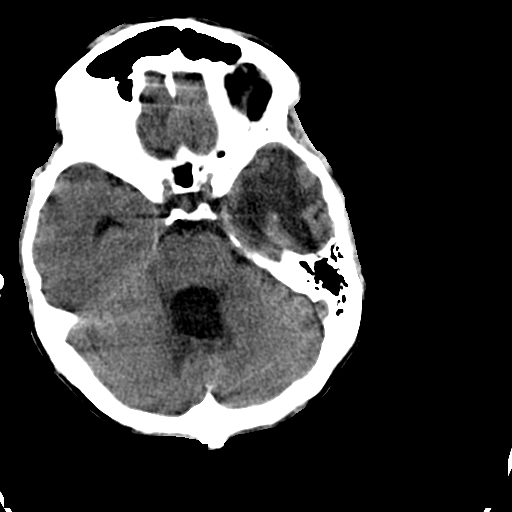
[im 10/33  brain]
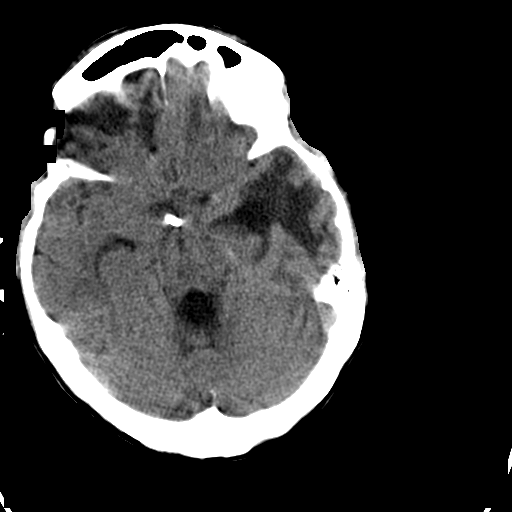
[im 10/33  bone]
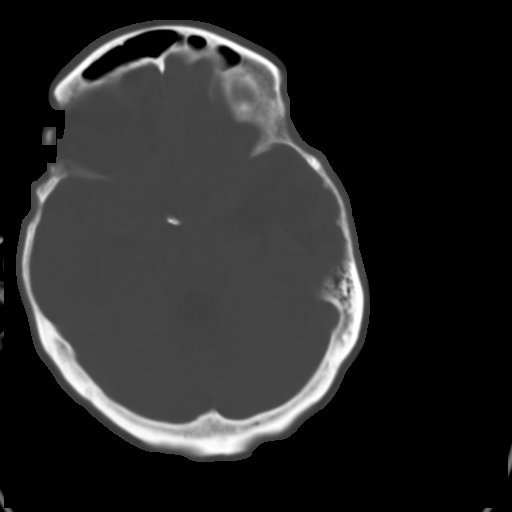
[im 12/33  brain]
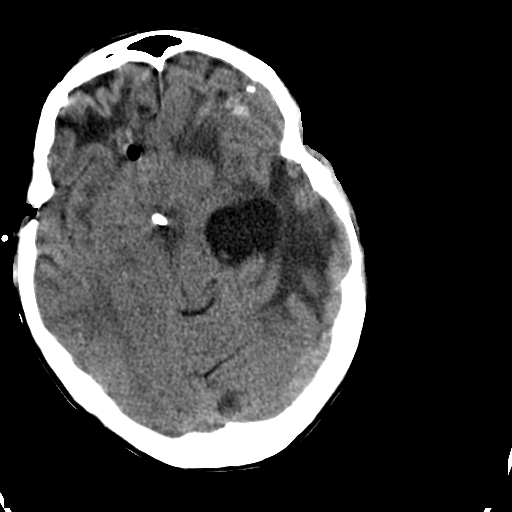
[im 13/33  brain]
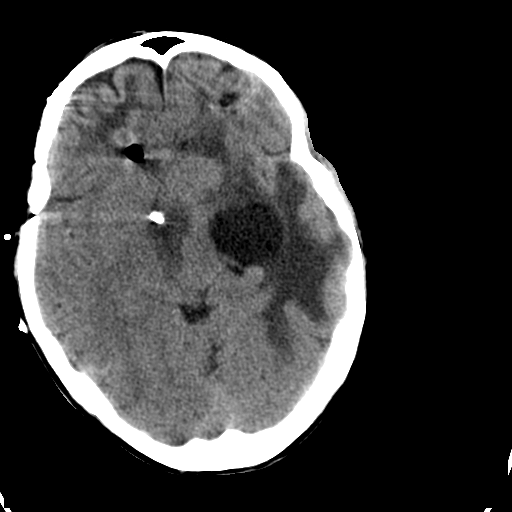
[im 15/33  brain]
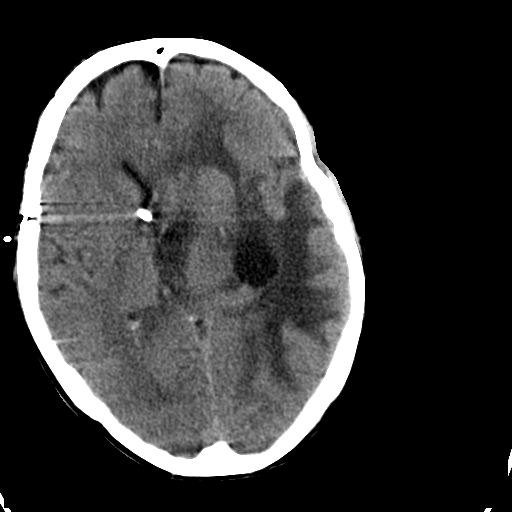
[im 18/33  brain]
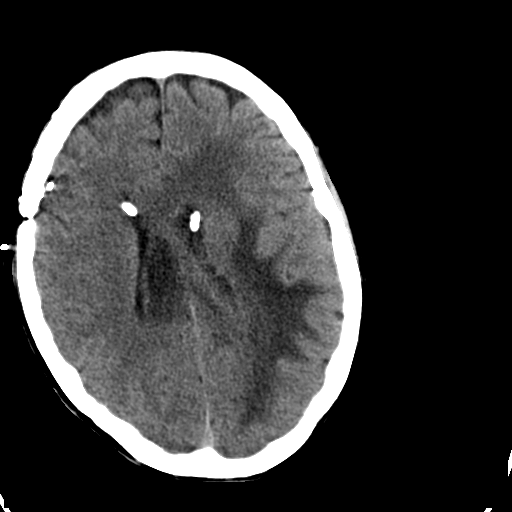
[im 18/33  bone]
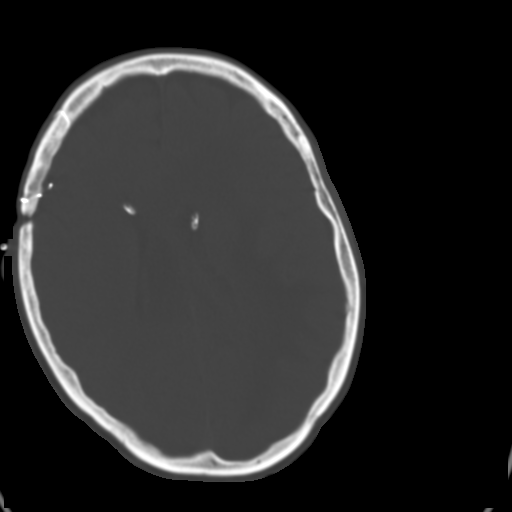
[im 20/33  brain]
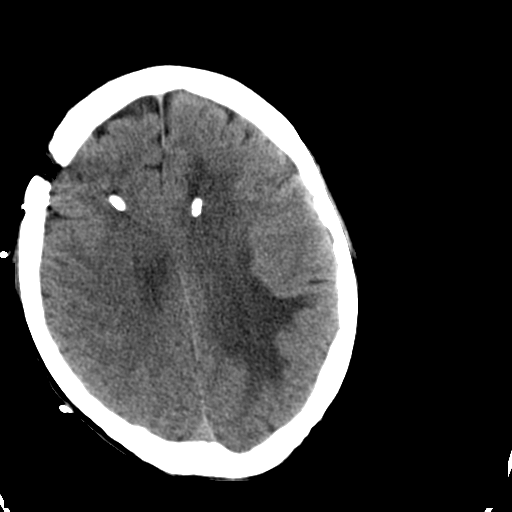
[im 21/33  brain]
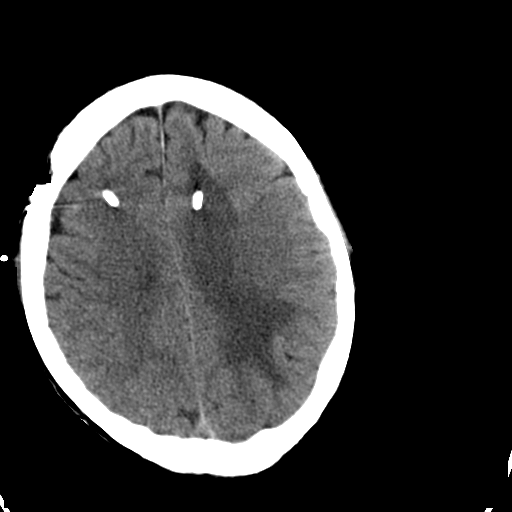
[im 23/33  brain]
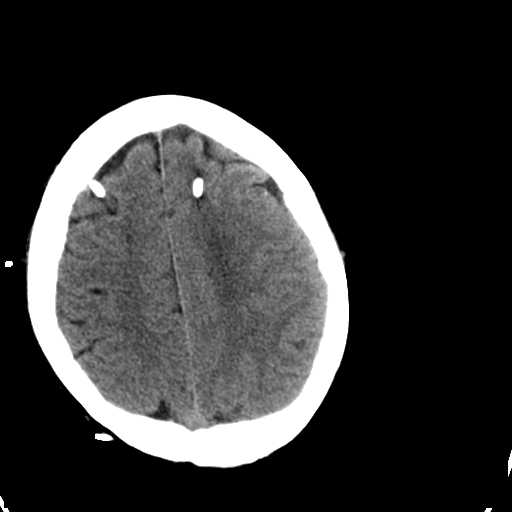
[im 25/33  brain]
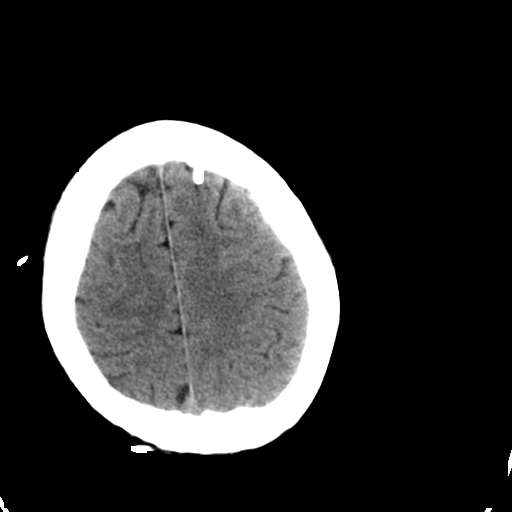
[im 25/33  bone]
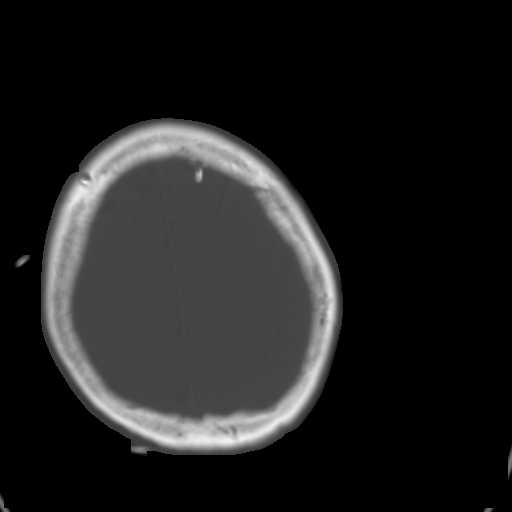
[im 28/33  brain]
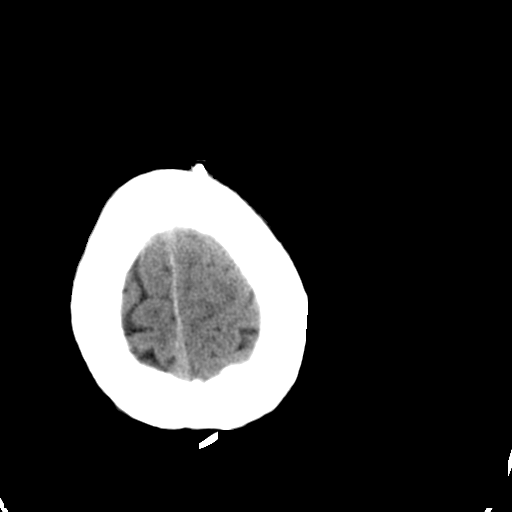
[im 29/33  brain]
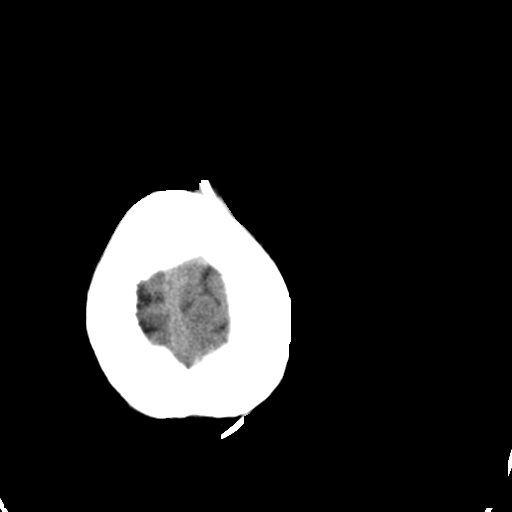
[im 31/33  brain]
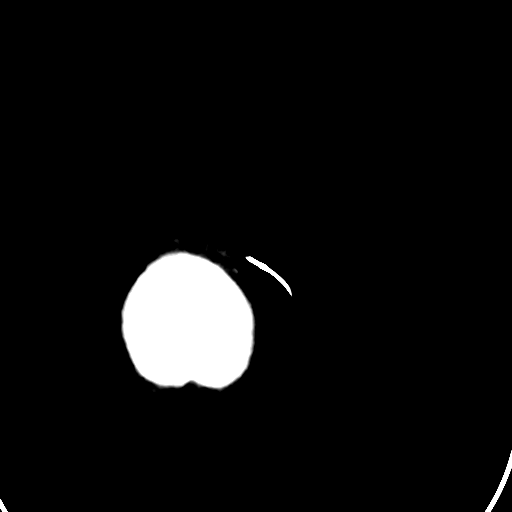

[16 of 30 positions shown; findings below may reference images not displayed]

FINDINGS: Ventricular size has decreased since prior study. Bilateral
ventricular drains remain in place. The fourth ventricle remains dilated as does
the temporal horn of the left lateral ventricle. Left-to-right midline shift is
stable. Extensive edema noted within the left cerebral hemisphere. Right frontal
encephalomalacia and probable bifrontal intracerebral hemorrhages stable.

IMPRESSION

Decreasing size of the ventricles. Fourth ventricle and temporal horn of the
left lateral ventricle remain dilated. Otherwise no change.

## 2006-06-22 IMAGING — CR DG CHEST 1V PORT
1 series · 1 of 1 positions shown · non-contrast
Comparison: 09/24/05 and 09/16/05.

CLINICAL DATA: Intracranial hemorrhage.  Ventilatory dependent respiratory failure. Follow-up right lung infiltrate.  COPD.
PORTABLE CHEST - 1 VIEW - 09/26/05:

[view not recorded]
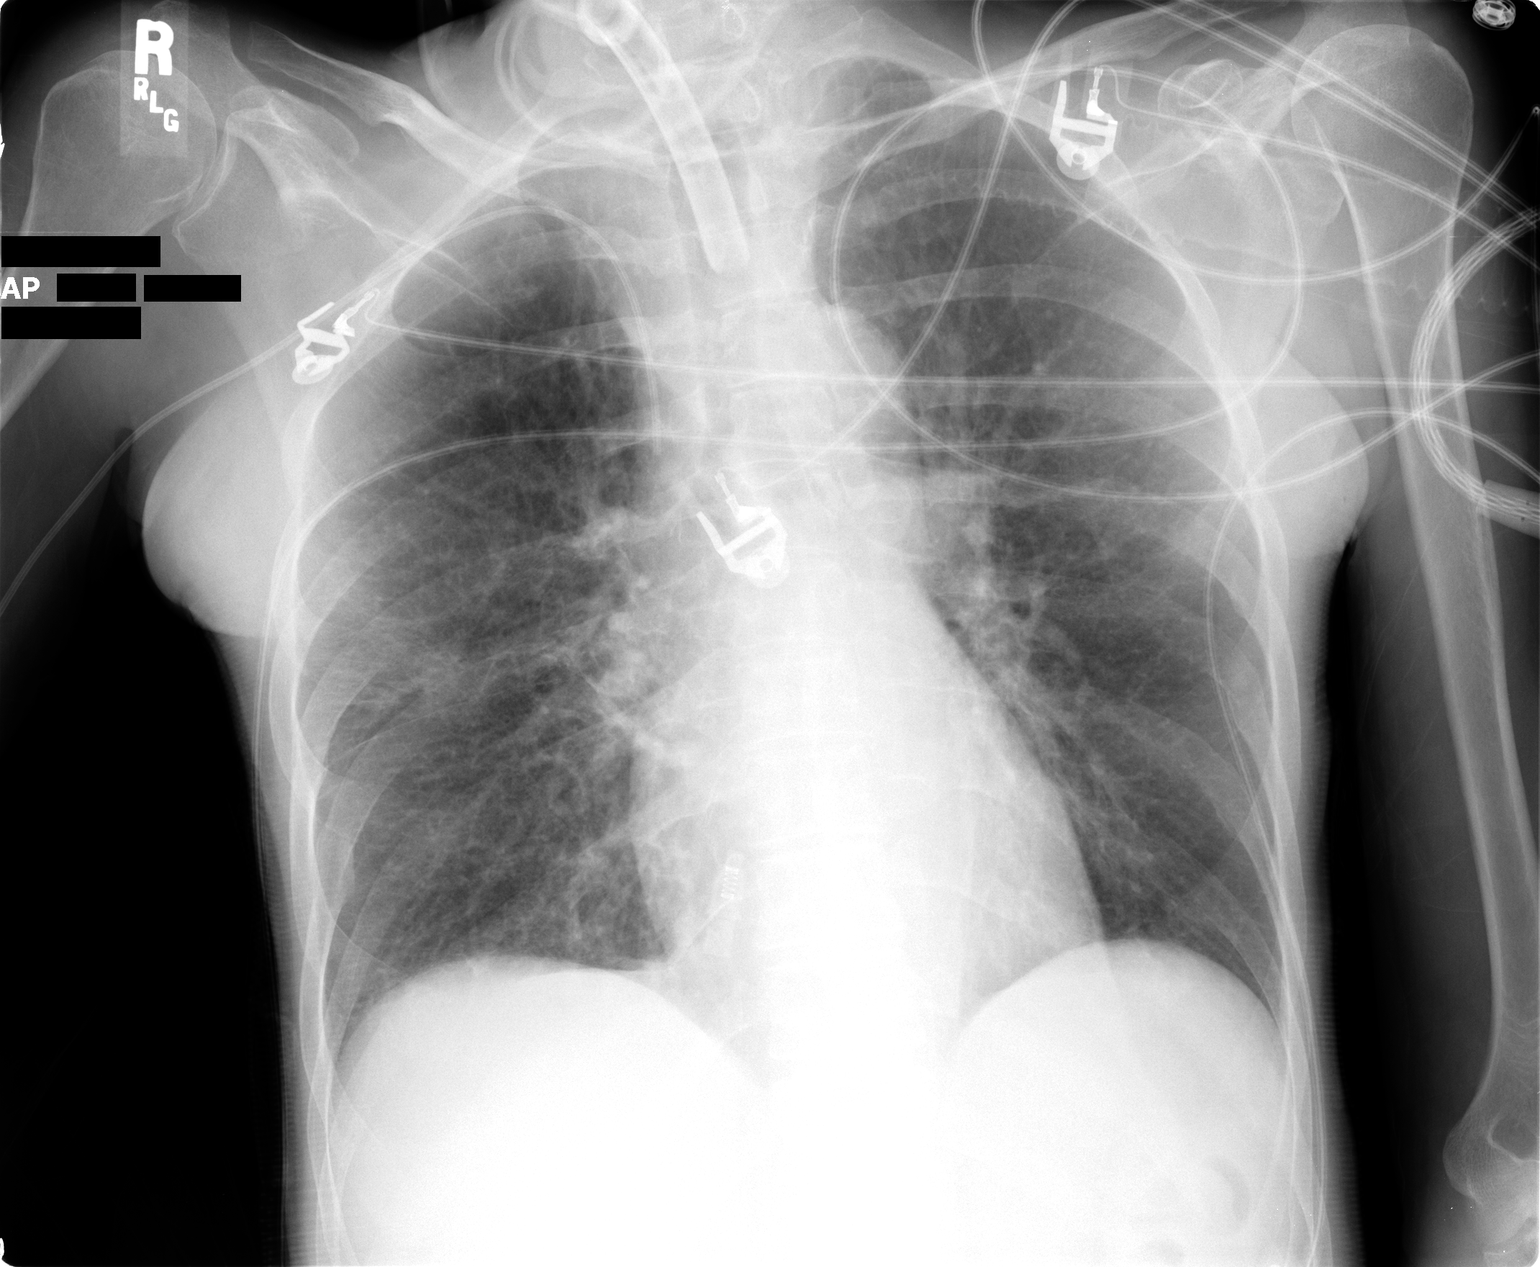

[1 of 1 positions shown; findings below may reference images not displayed]

FINDINGS: The tracheostomy tube and right arm PICC line remain in appropriate position.  The lungs are clear and no residual infiltrate is seen since the prior study.  There is no evidence of pleural effusion.  The heart size is normal.
IMPRESSION: No active disease.   Resolution of right lower lobe infiltrate since prior studies.

## 2006-06-23 ENCOUNTER — Ambulatory Visit: Payer: Self-pay | Admitting: Family Medicine

## 2006-06-27 IMAGING — CT CT HEAD W/O CM
1 of 2 series · 13 of 30 positions shown, 17 images · IV contrast (agent unspecified)
Comparison: 09/26/05.

CLINICAL DATA: Intracranial hemorrhage.  
 HEAD CT WITHOUT CONTRAST:
TECHNIQUE: Contiguous axial images were obtained from the base of the skull through the vertex according to standard protocol without contrast.

[Series 2: brain · axial · 0.47mm/px · z∈[+160,+291]mm · 13 of 32 slices shown, 17 images]
[im 3/32  brain]
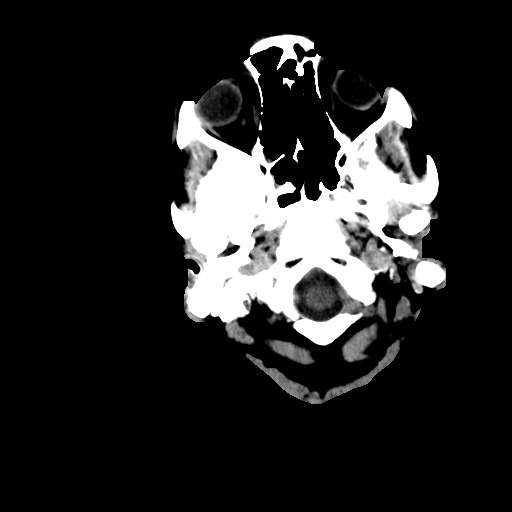
[im 3/32  bone]
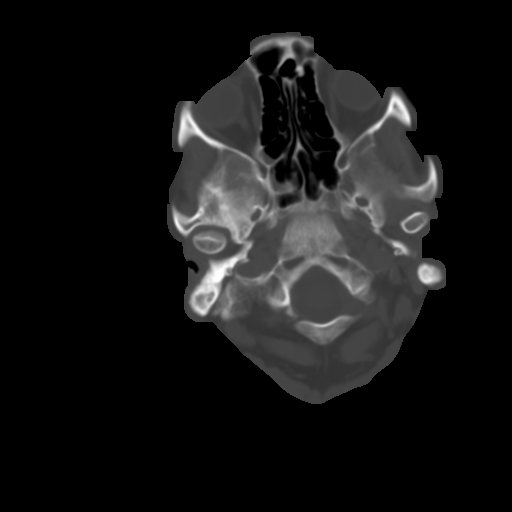
[im 5/32  brain]
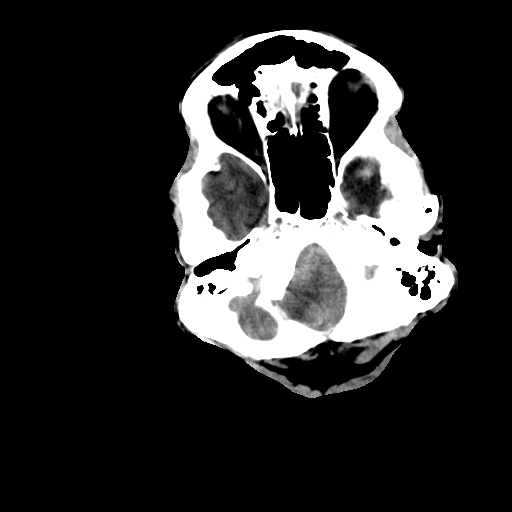
[im 7/32  brain]
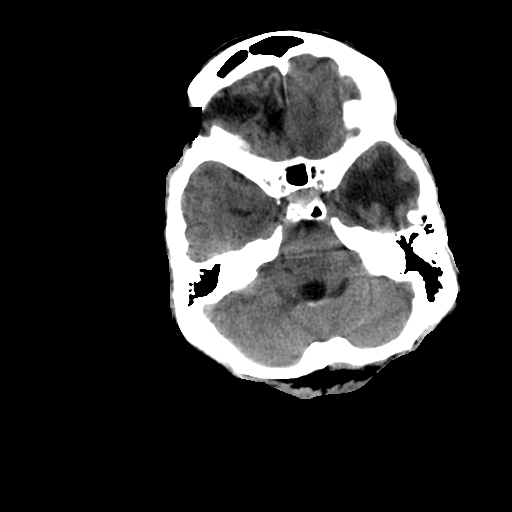
[im 9/32  brain]
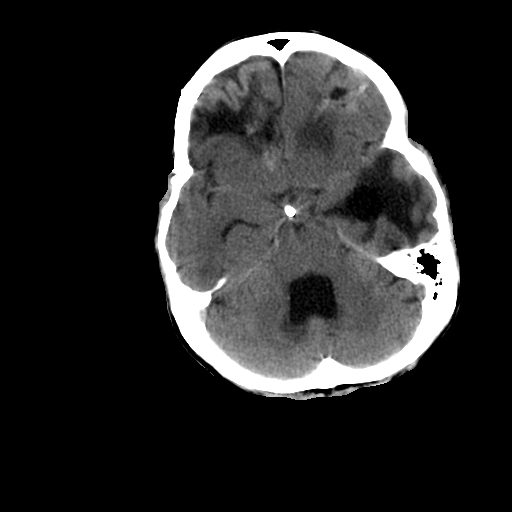
[im 12/32  brain]
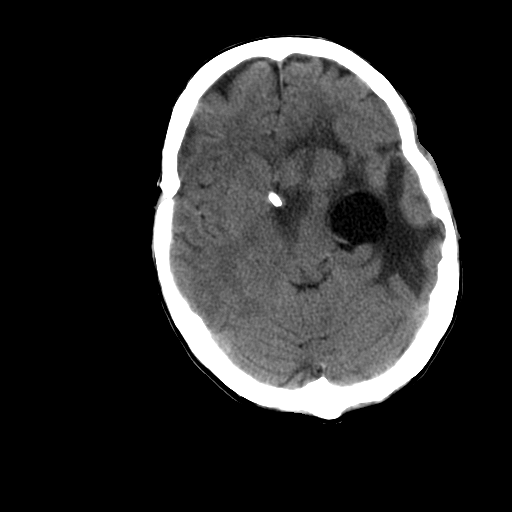
[im 12/32  bone]
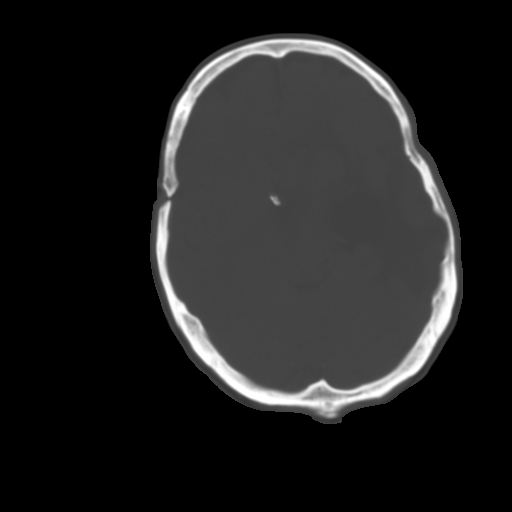
[im 14/32  brain]
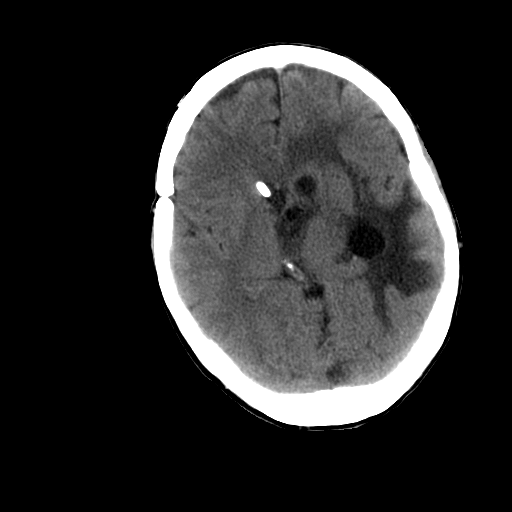
[im 16/32  brain]
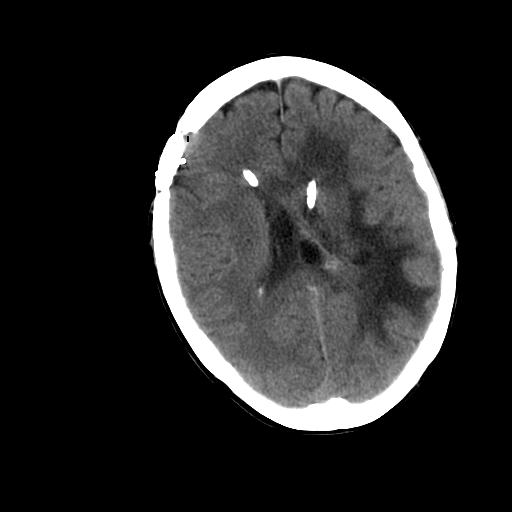
[im 18/32  brain]
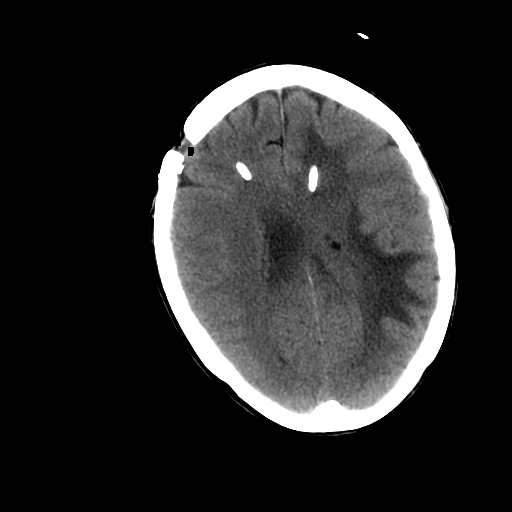
[im 20/32  brain]
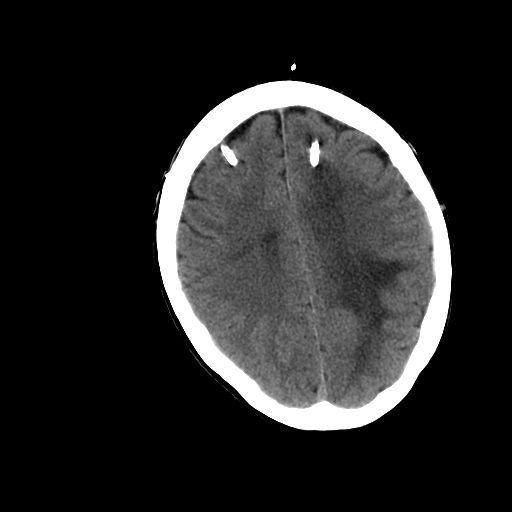
[im 20/32  bone]
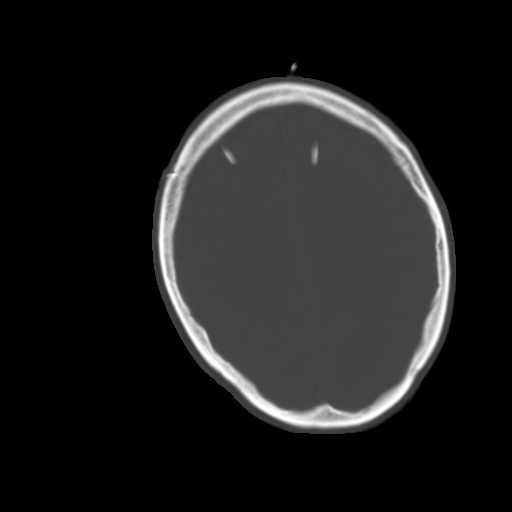
[im 23/32  brain]
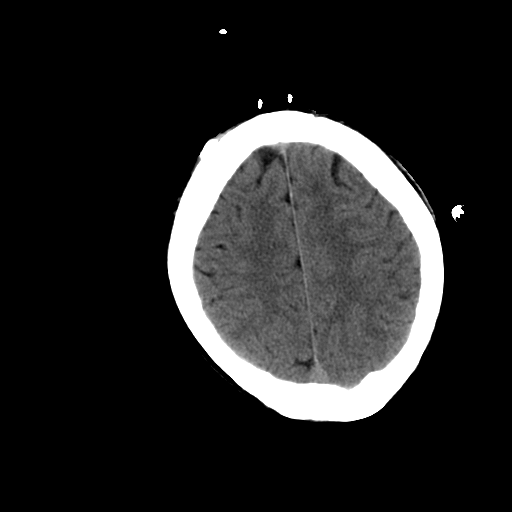
[im 25/32  brain]
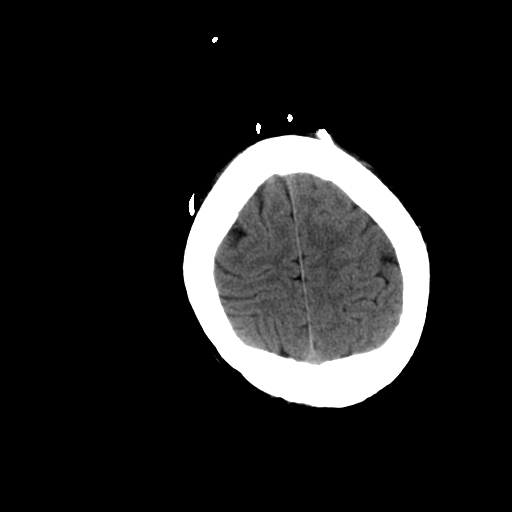
[im 27/32  brain]
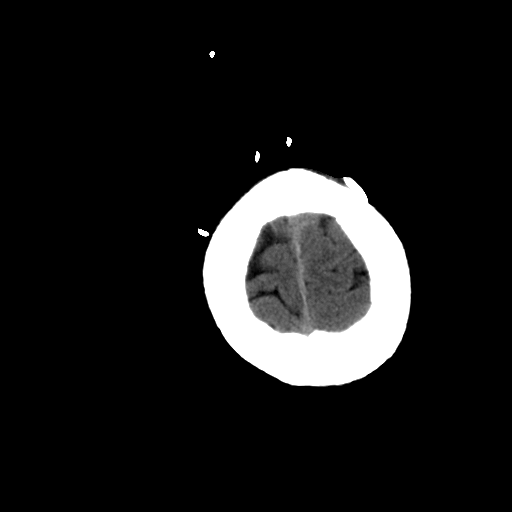
[im 29/32  brain]
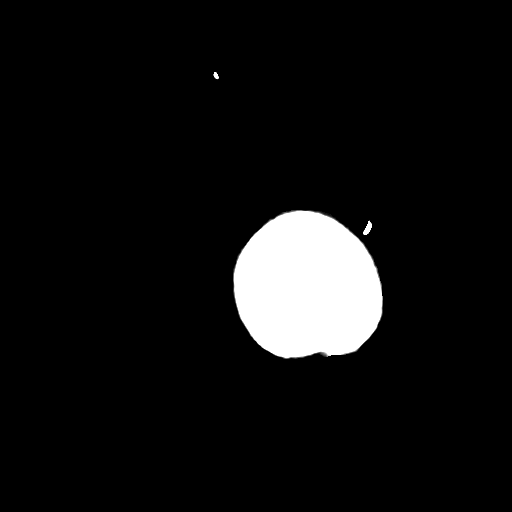
[im 29/32  bone]
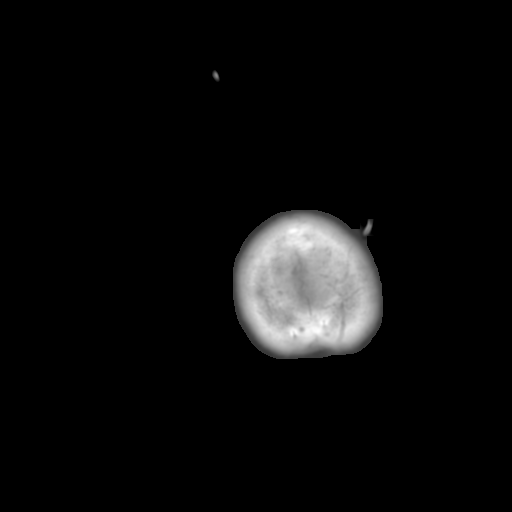

[13 of 30 positions shown; findings below may reference images not displayed]

FINDINGS: Bilateral ventriculostomy catheters remain in place.  Dilatation of the temporal horn of the left lateral ventricle and fourth ventricle do not appear markedly changed.  Left to right midline shift has decreased slightly to approximately 0.6 cm.  Hypoattenuation predominantly in the deep white matter of the left cerebral hemisphere does not appear markedly changed.  There is also a large area of hypoattenuation of the right frontal lobe also without interval change.  Patient is status post right-sided craniotomy.  No evidence of acute infarct or hemorrhage is identified.  No subdural hematoma is seen.
IMPRESSION: Mild interval decrease in left to right midline shift.  Examination is otherwise unchanged.

## 2006-07-04 IMAGING — CT CT HEAD W/O CM
1 of 2 series · 13 of 30 positions shown, 17 images · non-contrast
Comparison: 10/01/05.

CLINICAL DATA: Intracranial hemorrhage, hydrocephalus. 
HEAD CT WITHOUT CONTRAST ? 10/08/05:
TECHNIQUE: Contiguous axial images were obtained from the base of the skull through the vertex according to standard protocol without contrast.

[Series 2: headseq 4.8 h45s · axial · 0.40mm/px · z∈[-153,-27]mm · 13 of 33 slices shown, 17 images]
[im 3/33  brain]
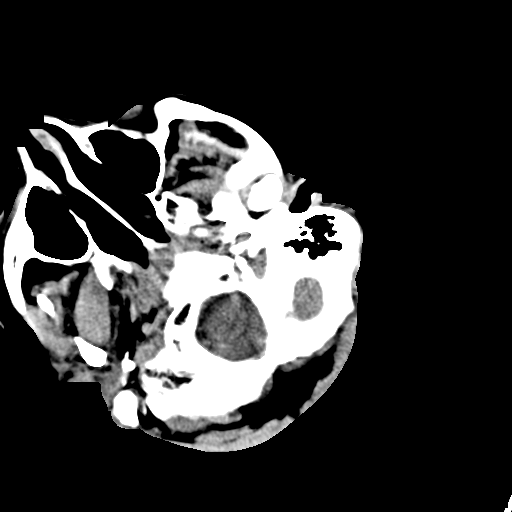
[im 3/33  bone]
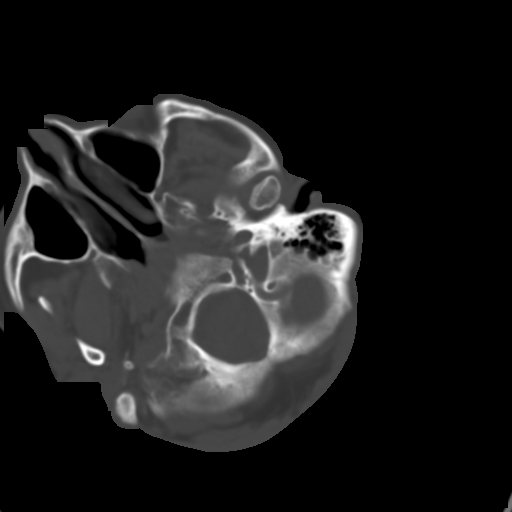
[im 5/33  brain]
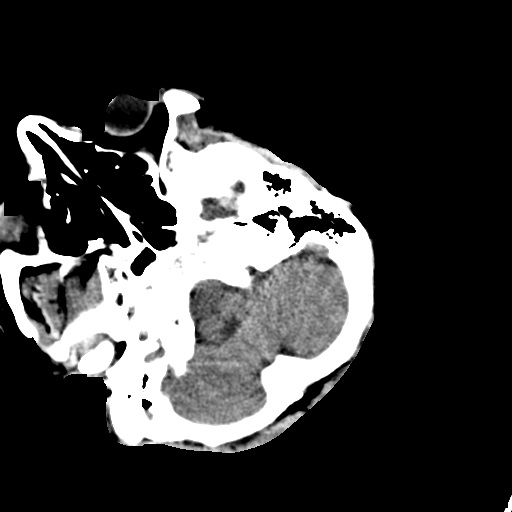
[im 7/33  brain]
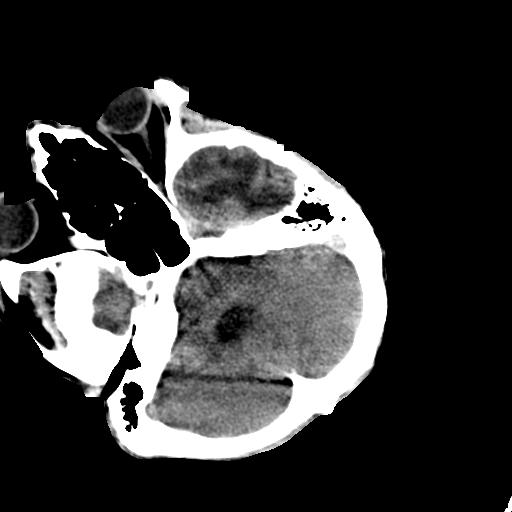
[im 10/33  brain]
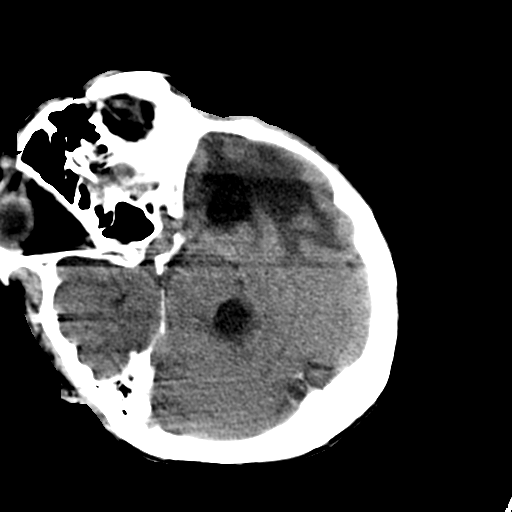
[im 12/33  brain]
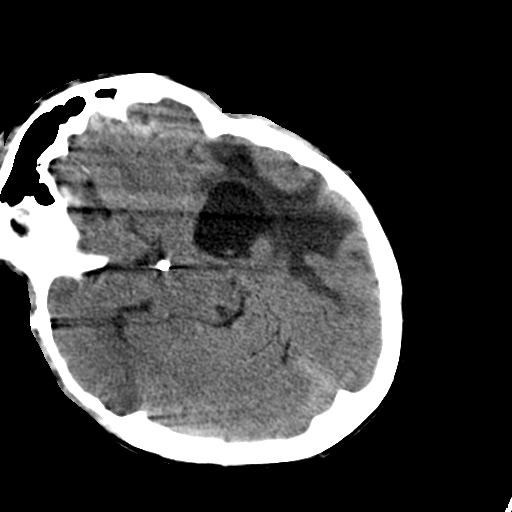
[im 12/33  bone]
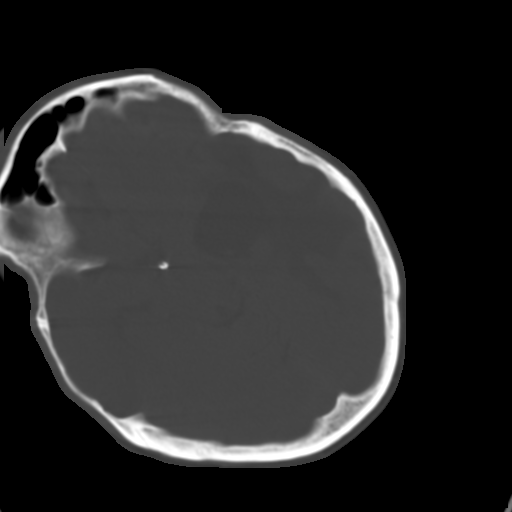
[im 14/33  brain]
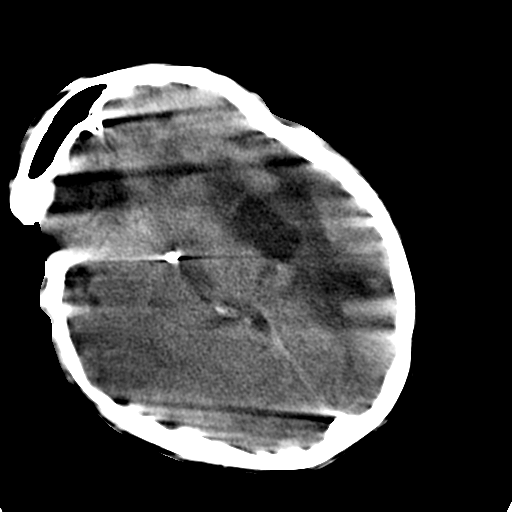
[im 17/33  brain]
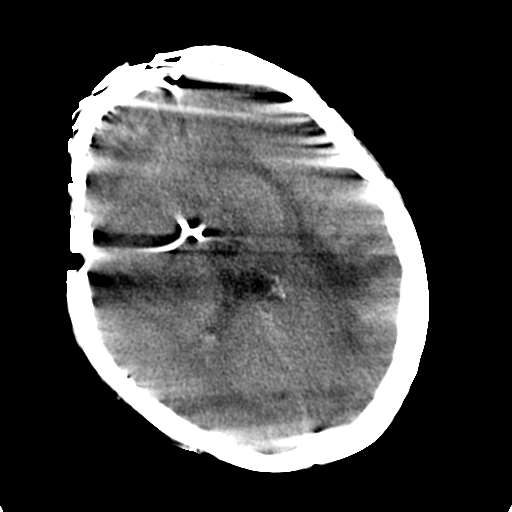
[im 19/33  brain]
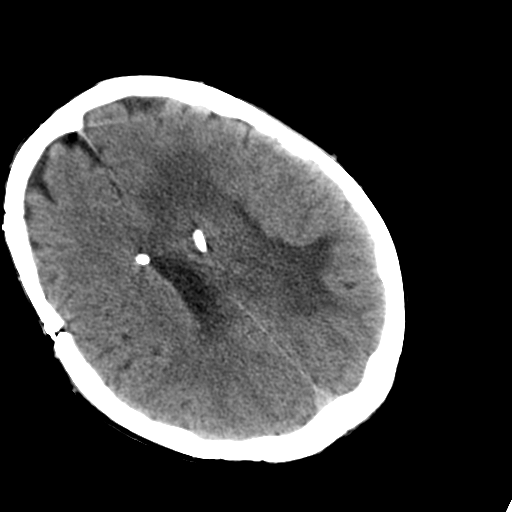
[im 21/33  brain]
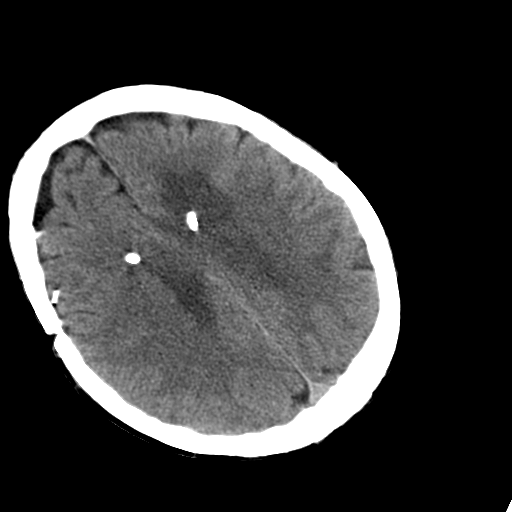
[im 21/33  bone]
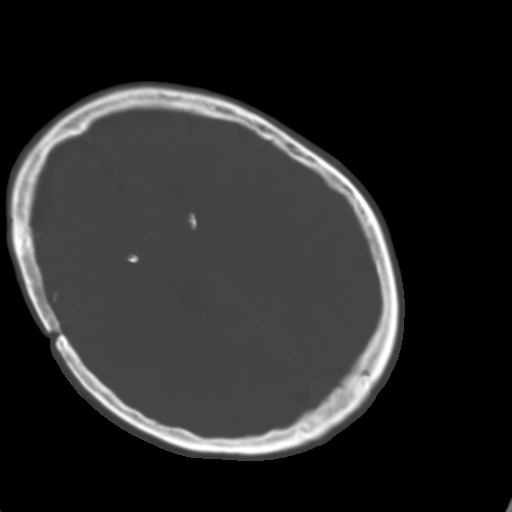
[im 23/33  brain]
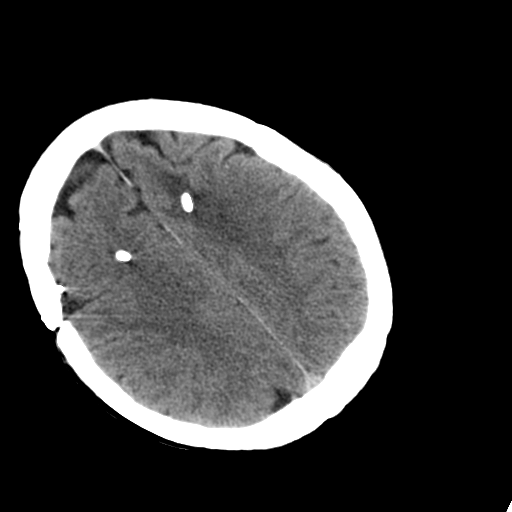
[im 26/33  brain]
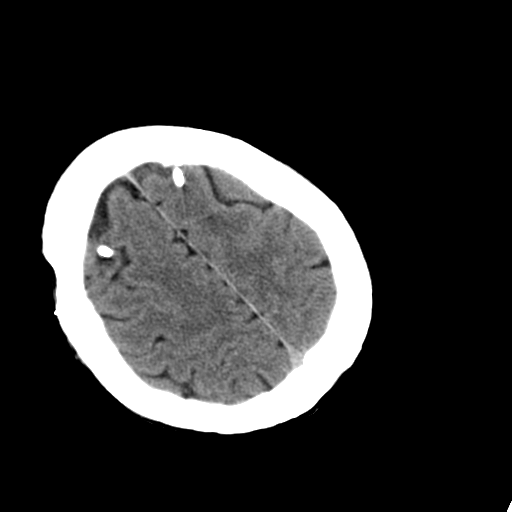
[im 28/33  brain]
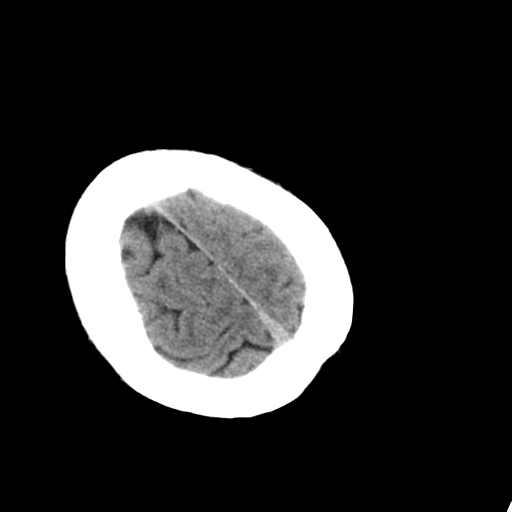
[im 30/33  brain]
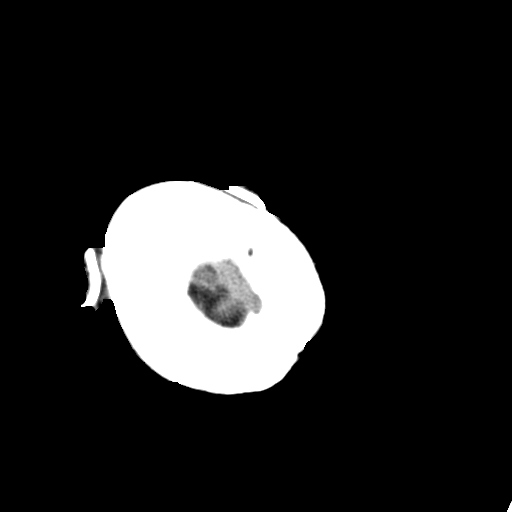
[im 30/33  bone]
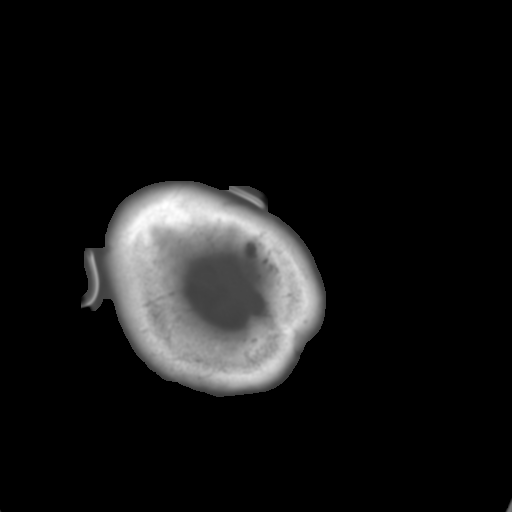

[13 of 30 positions shown; findings below may reference images not displayed]

FINDINGS: The 4th ventricle previously measured 2.1 cm transversely and now measures 1.8 cm transversely.  The dilated temporal horn in the left lateral ventricle measured 3.2 cm previously and currently measures up to 3.3 cm.  Vasogenic edema tracking in the left temporal lobe is noted. There is encephalomalacia in the right frontal lobe. Bifrontal ventriculostomy catheters extend into the respective frontal horns of the right and left lateral ventricles. 
There continues to be some vague density along the left frontal lobe.  The chronic nature of this density is consistent with dystrophic calcification or possibly vascular malformation.  There continues to be some right deflection of the 3rd ventricle.  No new hemorrhage or new acute findings.
IMPRESSION: Essentially stable CT appearance of the head.

## 2006-07-05 IMAGING — CT CT HEAD W/O CM
1 of 2 series · 13 of 30 positions shown, 17 images · IV contrast (agent unspecified)
Comparison: 10/08/05.

CLINICAL DATA: Intracranial hemorrhage, status-post shunt revision. 
HEAD CT WITHOUT CONTRAST:
TECHNIQUE: Contiguous axial images were obtained from the base of the skull through the vertex according to standard protocol without contrast.

[Series 2: brain · axial · 0.47mm/px · z∈[+159,+291]mm · 13 of 40 slices shown, 17 images]
[im 3/40  brain]
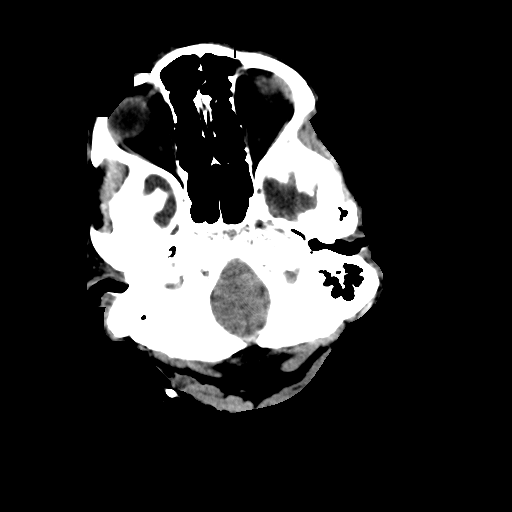
[im 3/40  bone]
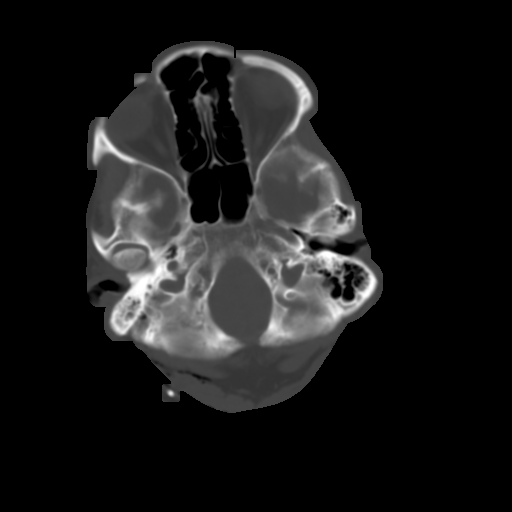
[im 6/40  brain]
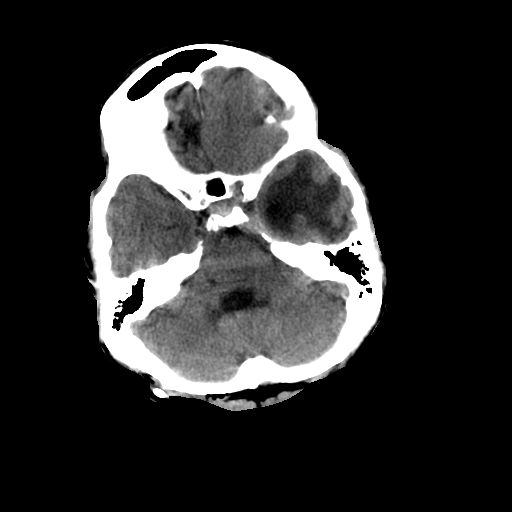
[im 9/40  brain]
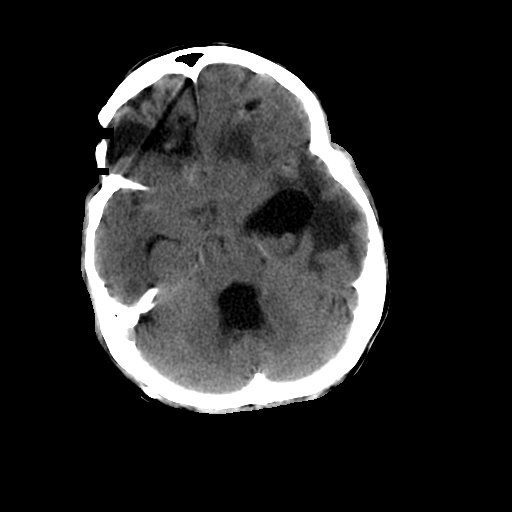
[im 12/40  brain]
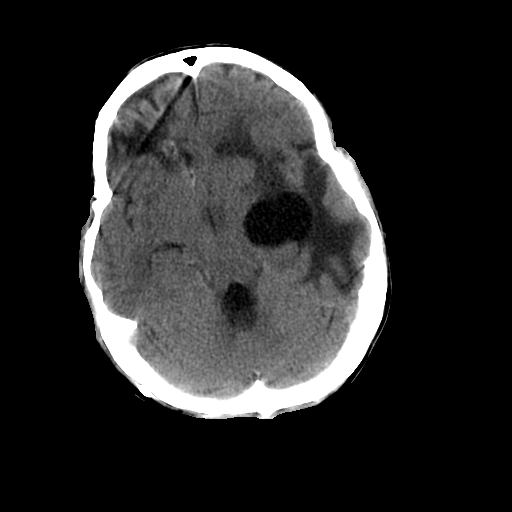
[im 14/40  brain]
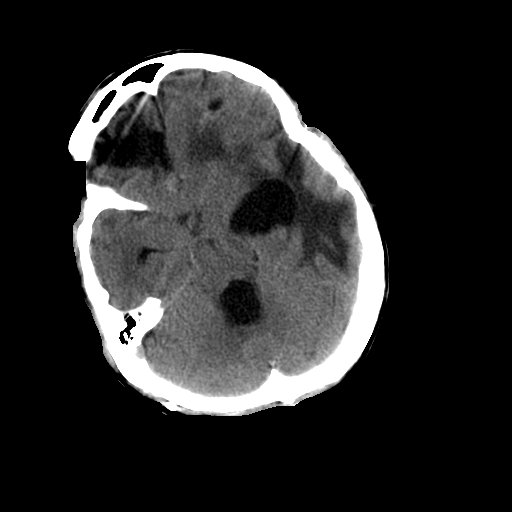
[im 14/40  bone]
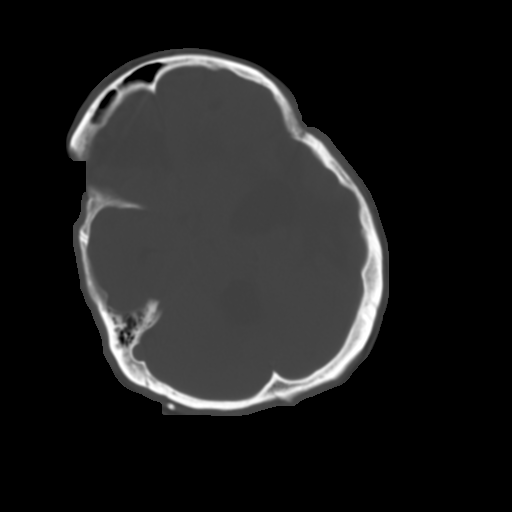
[im 17/40  brain]
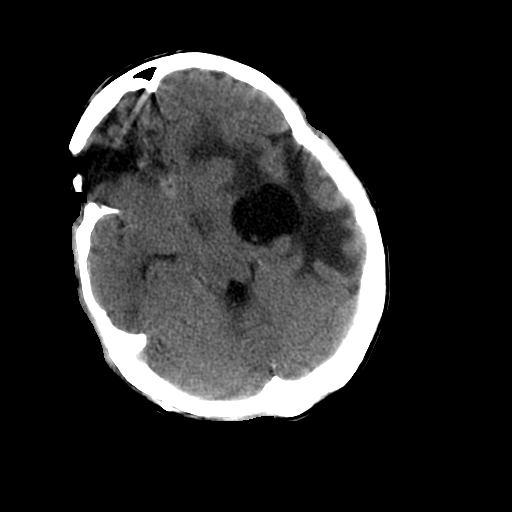
[im 20/40  brain]
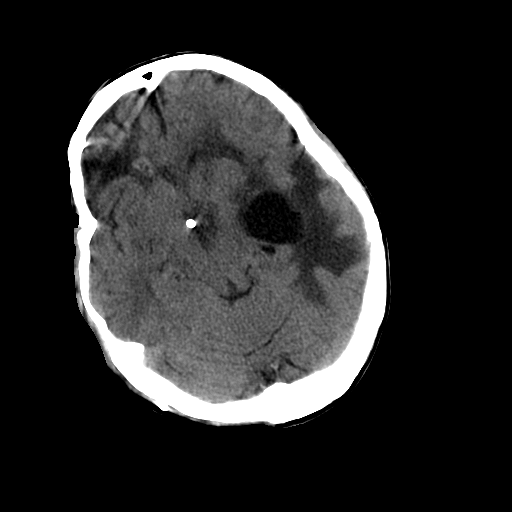
[im 23/40  brain]
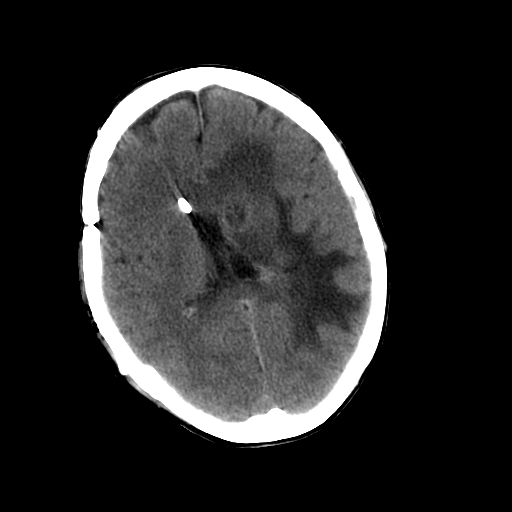
[im 26/40  brain]
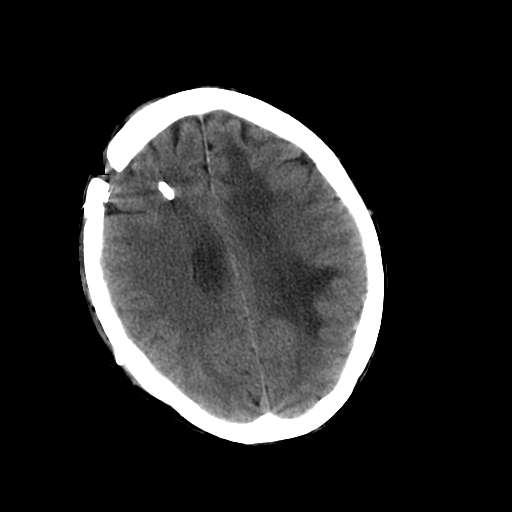
[im 26/40  bone]
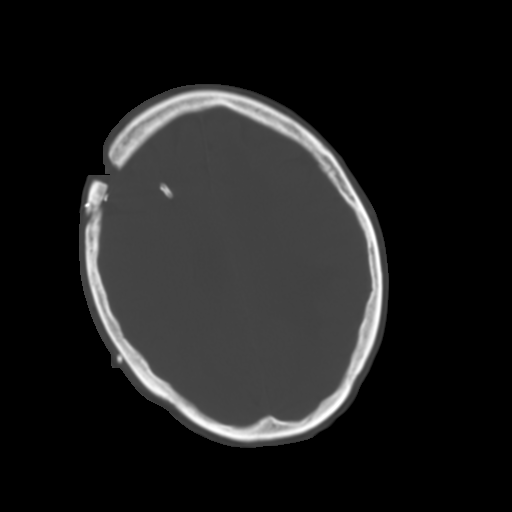
[im 28/40  brain]
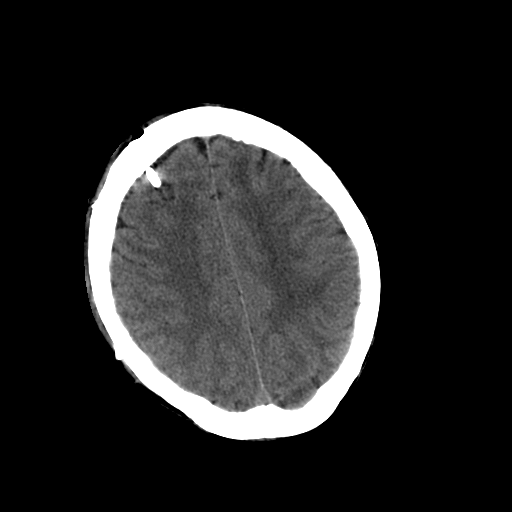
[im 31/40  brain]
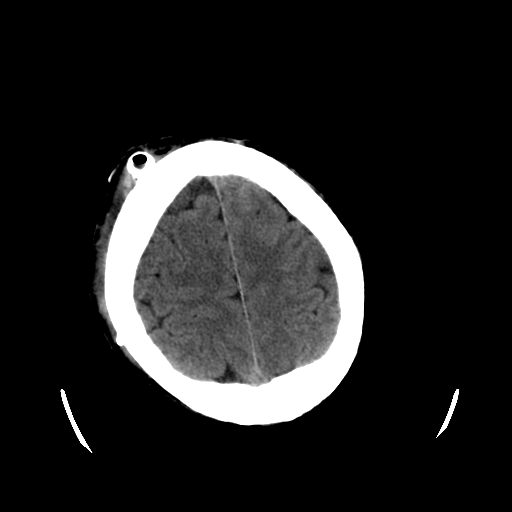
[im 34/40  brain]
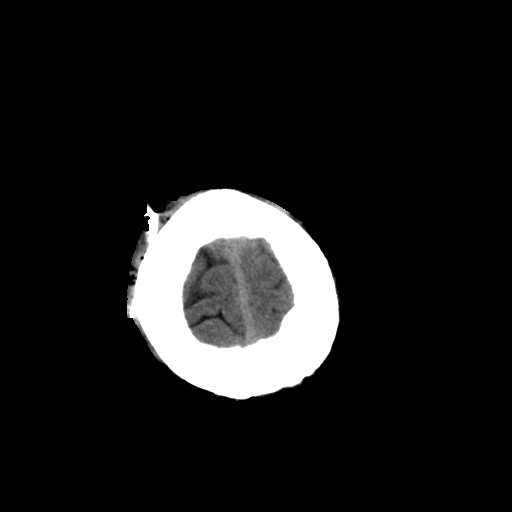
[im 37/40  brain]
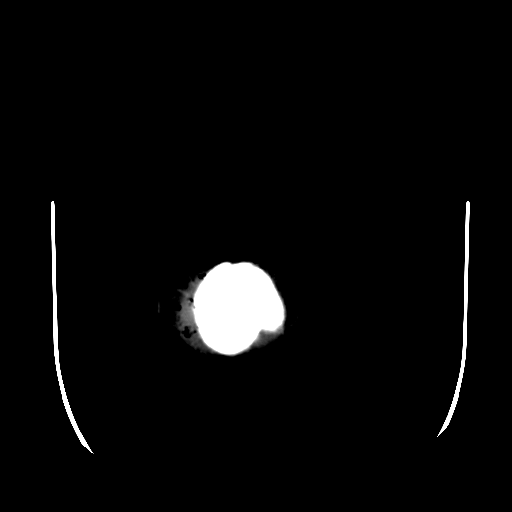
[im 37/40  bone]
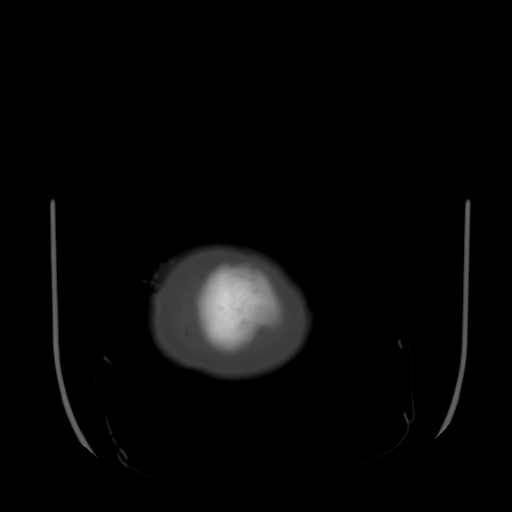

[13 of 30 positions shown; findings below may reference images not displayed]

FINDINGS: Left frontal ventriculostomy catheter has been removed.  The right frontal ventriculostomy catheter appears to have been tunneled in the right scalp and along the posterolateral right neck. 
Transverse dimension of the dilated portion of the fourth ventricle is 18 mm, stable from the prior exam.  Dilated portion of the temporal tip of the left lateral ventricle is likewise stable.  Chronic dystrophic calcification in the left frontal lobe and along the right frontal encephalomalacia bed is noted and is stable.  Mild rightward shift is again identified.  A tiny amount of density along the tract previously occupied by the left frontal ventriculostomy catheter may represent a tiny amount of hemorrhage, for example on images 16 and 17 of series 2. 
The frontal horn of the left lateral ventricle is effaced and indistinct.  There is a suggestion of hypodensity along the left caudate nucleus, and there is hypodensity tracking in the left temporal lobe and along the left external capsule consistent with vasogenic edema.
IMPRESSION: 1.  The left frontal ventriculostomy catheter has been removed.  The right frontal ventriculostomy catheter has been tunneled subcutaneously. 
2.  Stable appearance of the ventricular system.

## 2006-07-11 IMAGING — CT CT HEAD W/O CM
1 of 2 series · 13 of 30 positions shown, 17 images · IV contrast (agent unspecified)
Comparison: 10/09/05.

CLINICAL DATA: Meningitis.  Intracranial hemorrhage. 
 HEAD CT WITHOUT CONTRAST:
TECHNIQUE: Contiguous axial images were obtained from the base of the skull through the vertex according to standard protocol without contrast.

[Series 2: brain · axial · 0.47mm/px · z∈[+139,+268]mm · 13 of 32 slices shown, 17 images]
[im 3/32  brain]
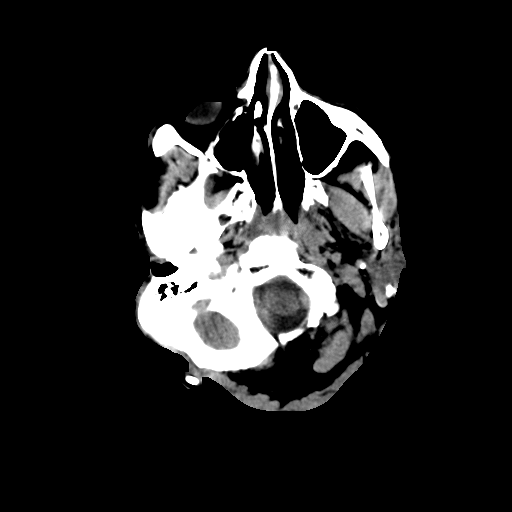
[im 3/32  bone]
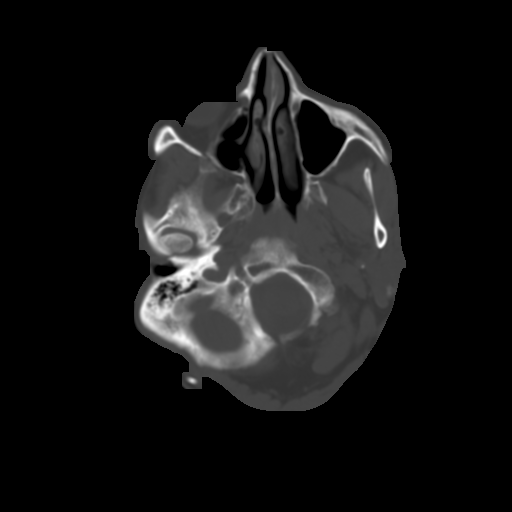
[im 5/32  brain]
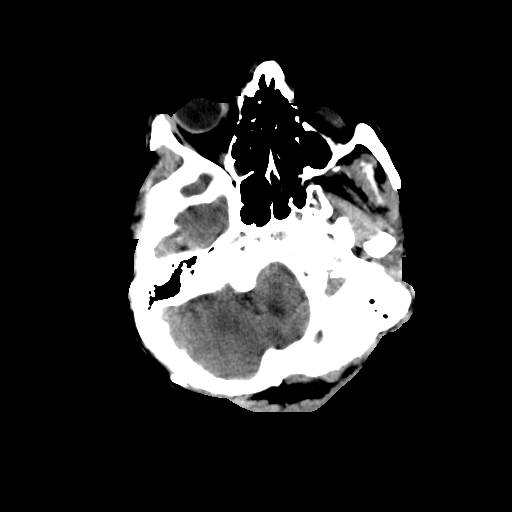
[im 7/32  brain]
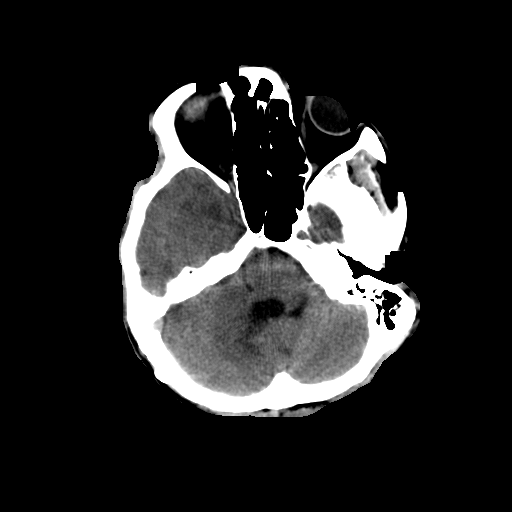
[im 9/32  brain]
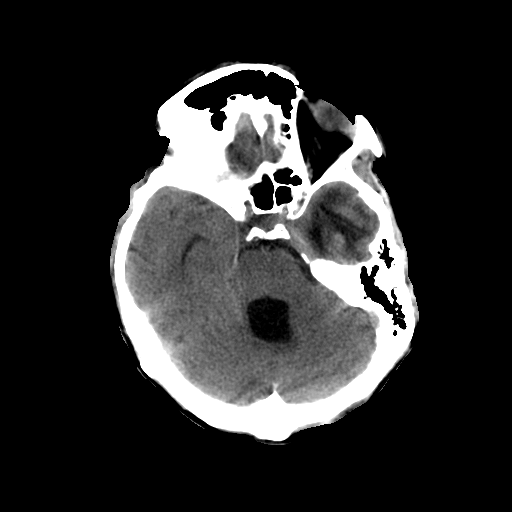
[im 12/32  brain]
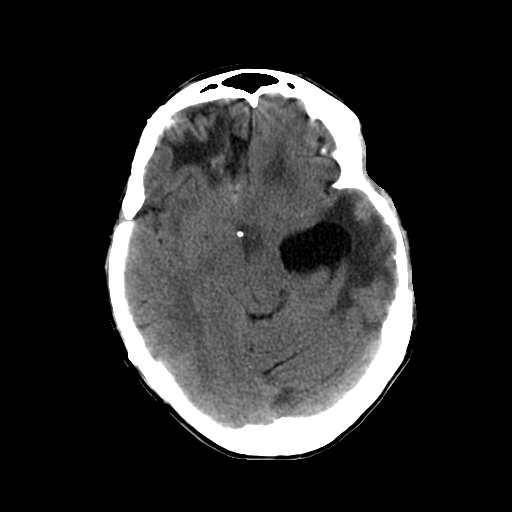
[im 12/32  bone]
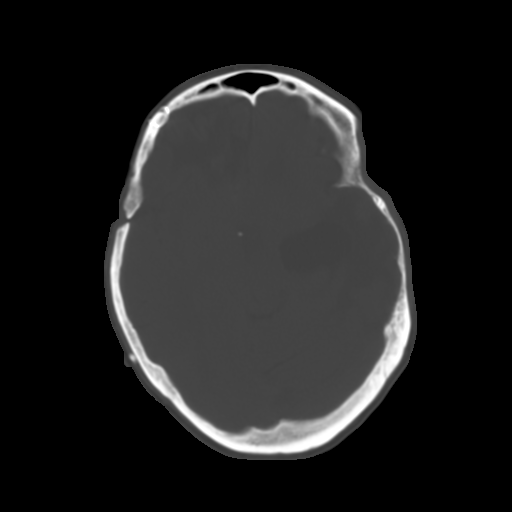
[im 14/32  brain]
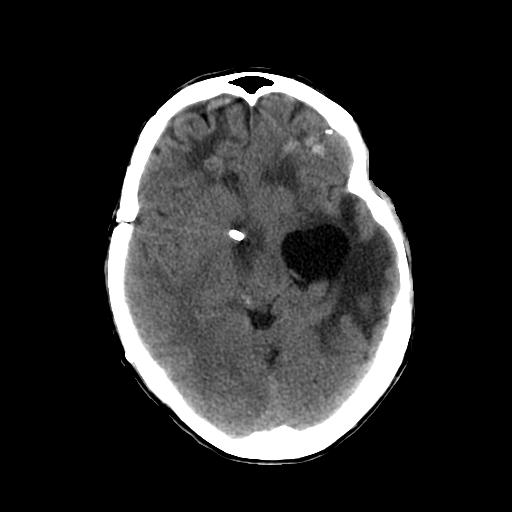
[im 16/32  brain]
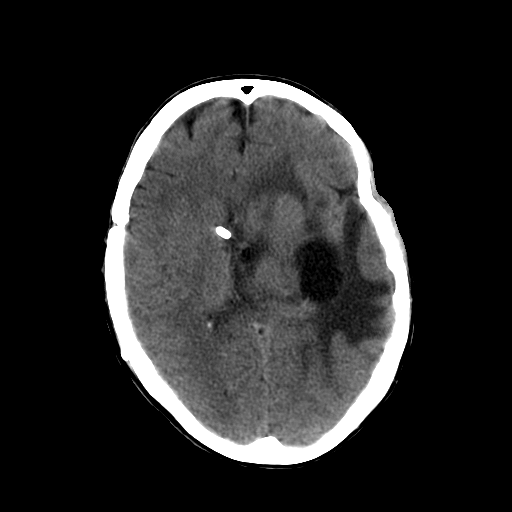
[im 18/32  brain]
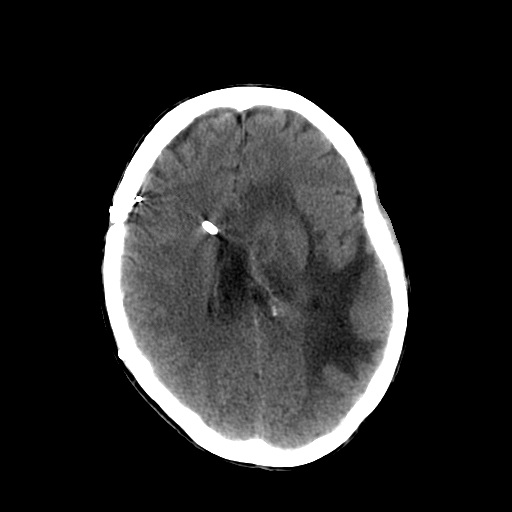
[im 20/32  brain]
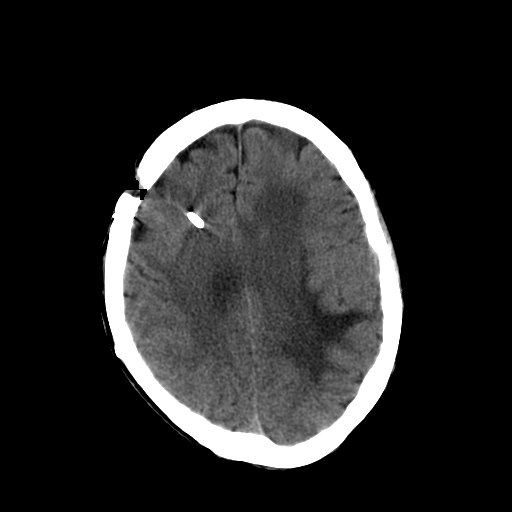
[im 20/32  bone]
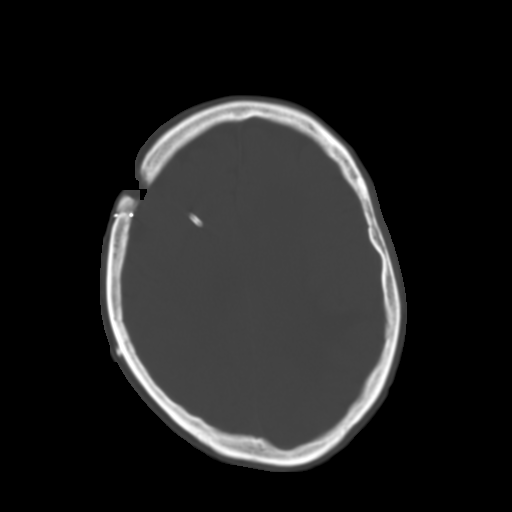
[im 23/32  brain]
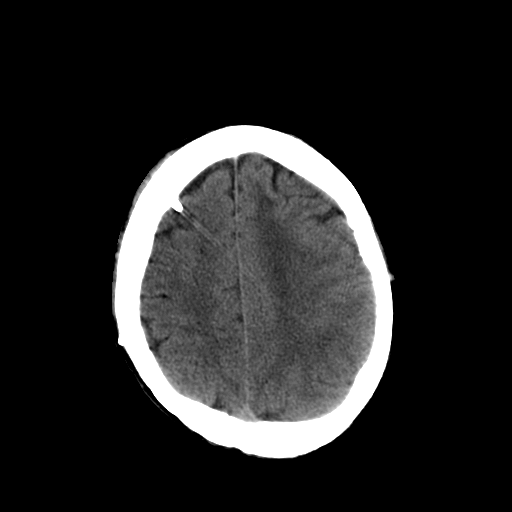
[im 25/32  brain]
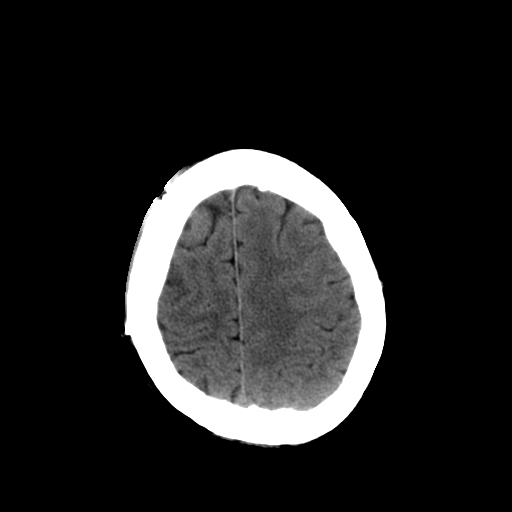
[im 27/32  brain]
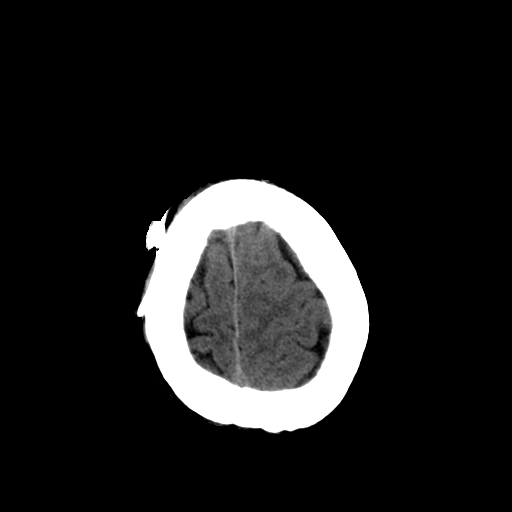
[im 29/32  brain]
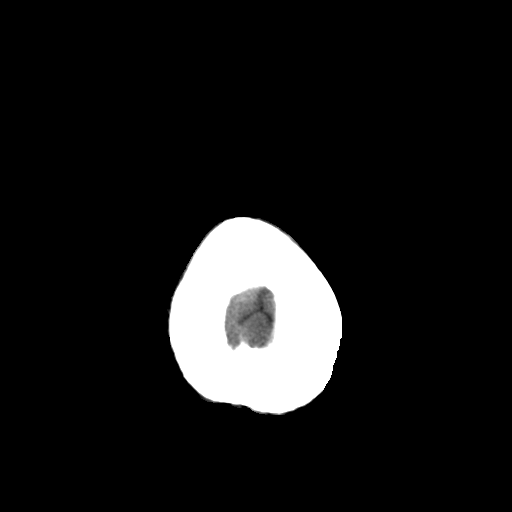
[im 29/32  bone]
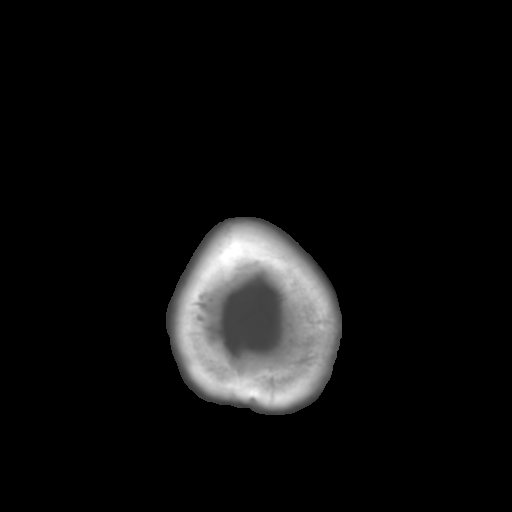

[13 of 30 positions shown; findings below may reference images not displayed]

FINDINGS: The catheter is noted with tip in the projection of the 3rd ventricle.  
 Transverse dimension of the dilated portion of the 4th ventricle is unchanged at 18 mm, stable from prior exam.  A dilated portion of temporal horn of the left lateral ventricle remains dilated the volume of which is unchanged.  Chronic dystrophic calcification in the left frontal lobe and along the right frontal encephalomalacia is unchanged.  
 Rightward midline shift is again noted.  This is similar in appearance to the prior exam.  The frontal horn of the left lateral ventricle remains effaced and indistinct.  Edema within the left temporal lobe and along the left external capsule is unchanged. 
 No new areas of hemorrhage are identified.
IMPRESSION: 1.  Stable exam from prior study.  
 2.  Stable appearance of the ventricular system unchanged.

## 2006-07-23 IMAGING — CR DG CHEST 1V PORT
1 series · 1 of 1 positions shown · non-contrast
Comparison: 09/26/05.

CLINICAL DATA: Intracranial hemorrhage, question of aspiration. 
 PORTABLE CHEST:

[view not recorded]
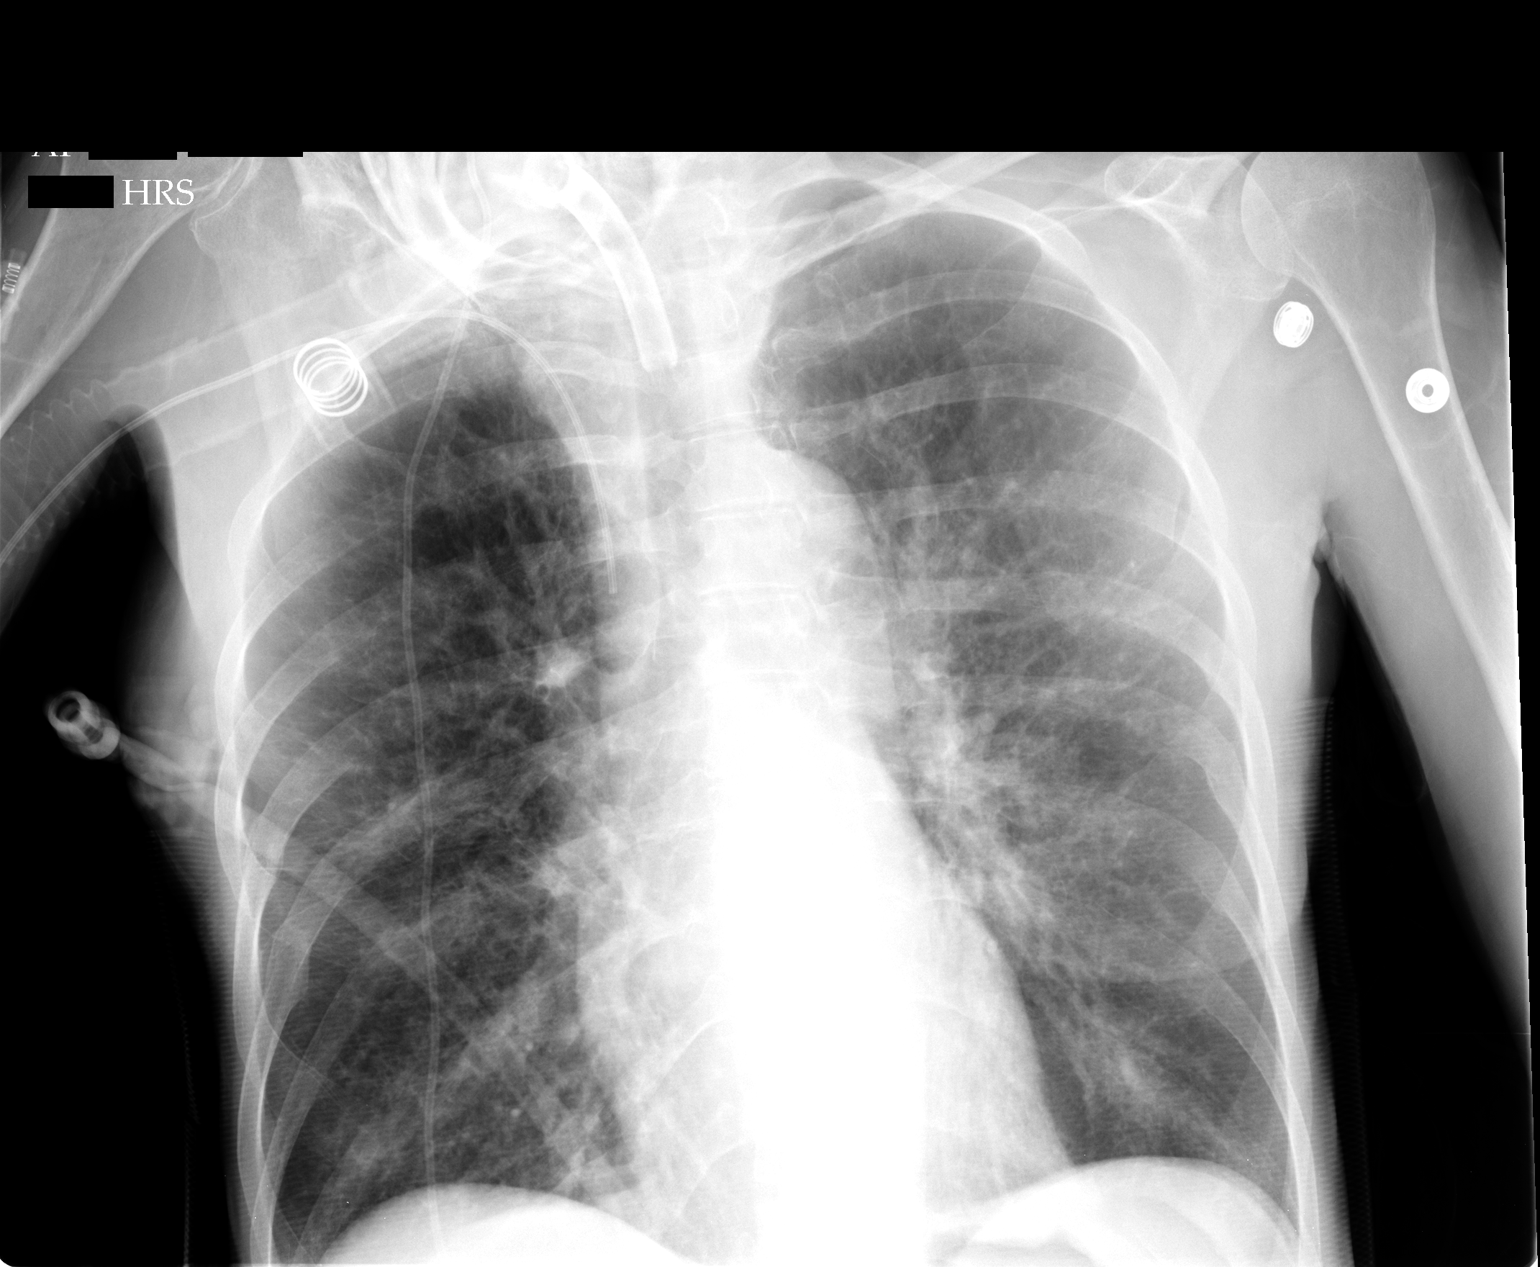

[1 of 1 positions shown; findings below may reference images not displayed]

FINDINGS: Support apparatus unchanged.  There is improved aeration of the lungs with stable emphysematous changes noted.  Patchy bilateral lobe airspace opacities are noted.  A bandage or other dressing overlying the chest obscures fine detail.
IMPRESSION: Improved aeration, COPD, with new bibasilar opacities which could represent aspiration, given the clinical history.

## 2006-07-25 IMAGING — CR DG CHEST 1V
1 series · 1 of 1 positions shown · non-contrast
Comparison: [HOSPITAL] portable chest x-ray of 10/27/00 at 6959 hours.

CLINICAL DATA: Follow-up pneumonia.  Ventilator.  
DIAGNOSTIC CHEST ? 1 VIEW ? 10/29/05:

[view not recorded]
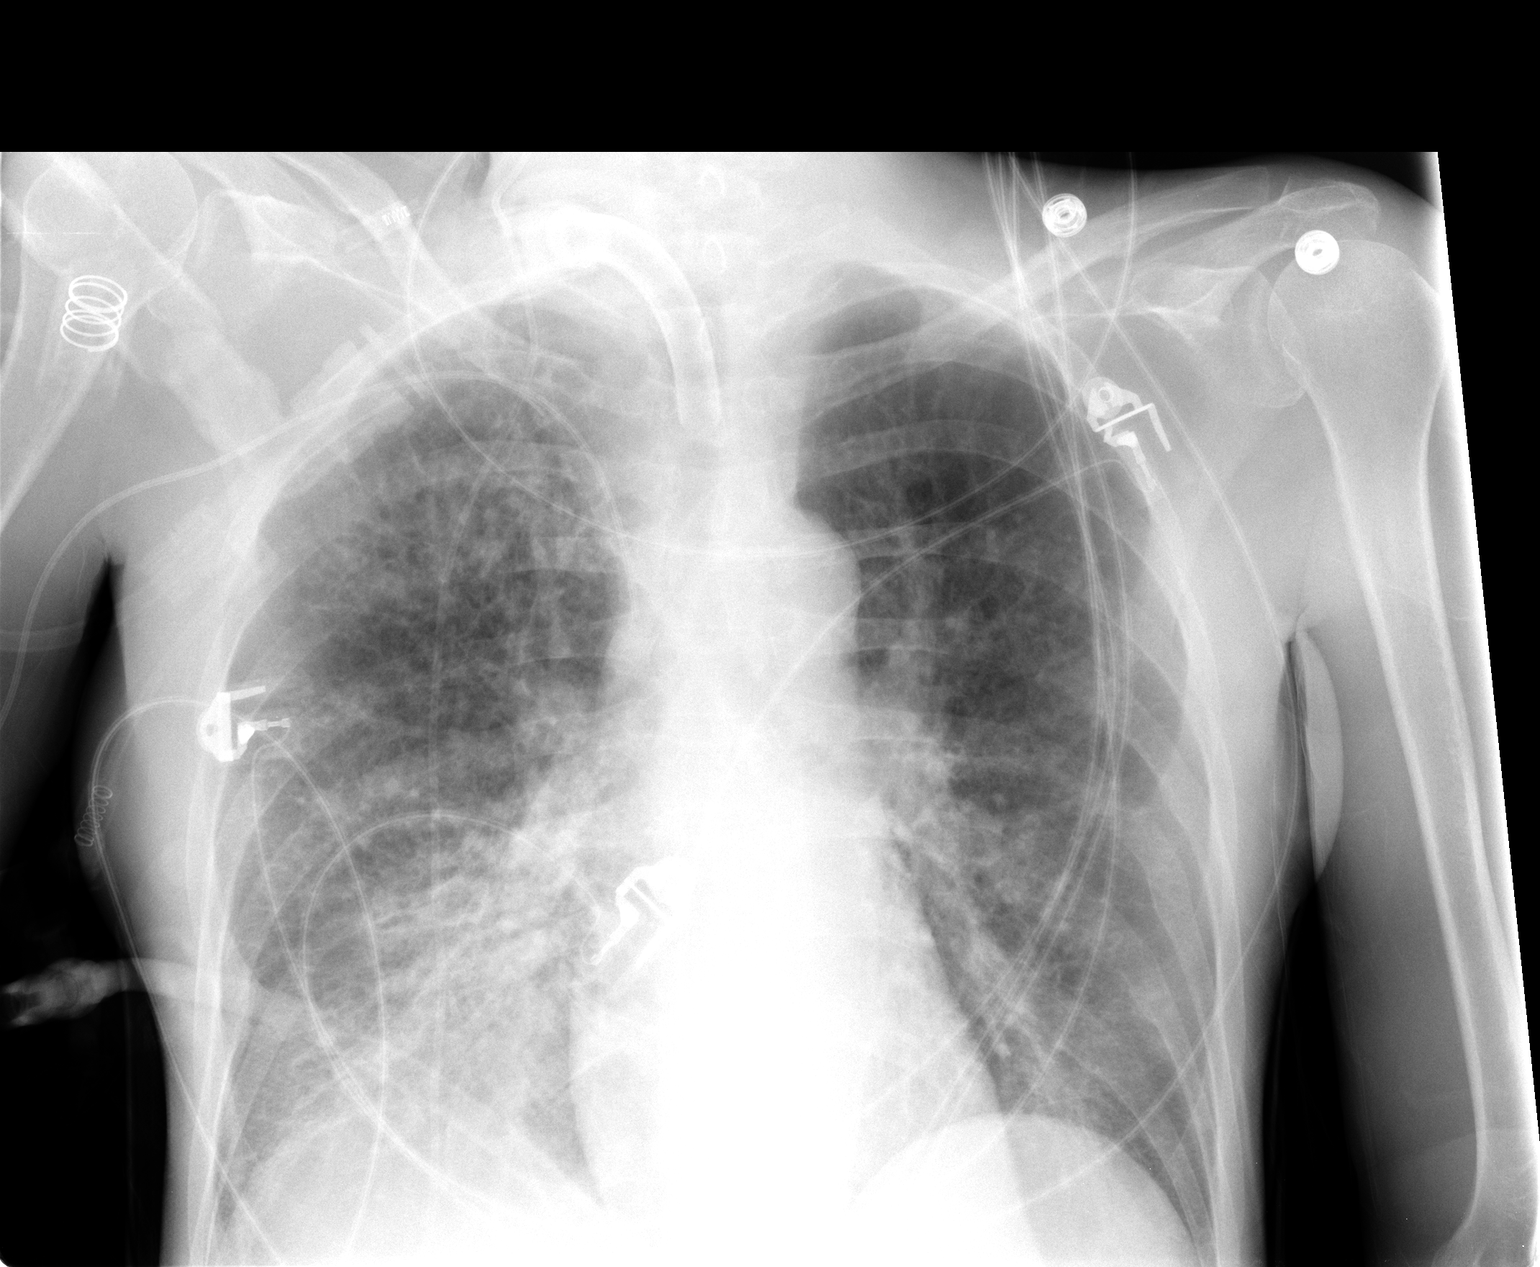

[1 of 1 positions shown; findings below may reference images not displayed]

FINDINGS: Interval right lower lobe infiltrate is seen.  Slightly progressive bilateral interstitial/early air space edema is noted.  The tracheostomy tube remains in satisfactory position with stable right PICC line ending in the superior vena cava.  The heart size is normal.   No change in old healed left rib fracture-deformities.
IMPRESSION: 1.  Progressive right lower lobe pneumonic infiltrate.
2.  Slight progressive interstitial/early air space edema.
3.  Stable support apparatus.

## 2006-07-27 IMAGING — CR DG CHEST 1V PORT
1 series · 1 of 1 positions shown · non-contrast
Comparison: 10/29/05.

CLINICAL DATA: Intracranial hemorrhage.
PORTABLE CHEST - 1 VIEW ([DATE]):

[view not recorded]
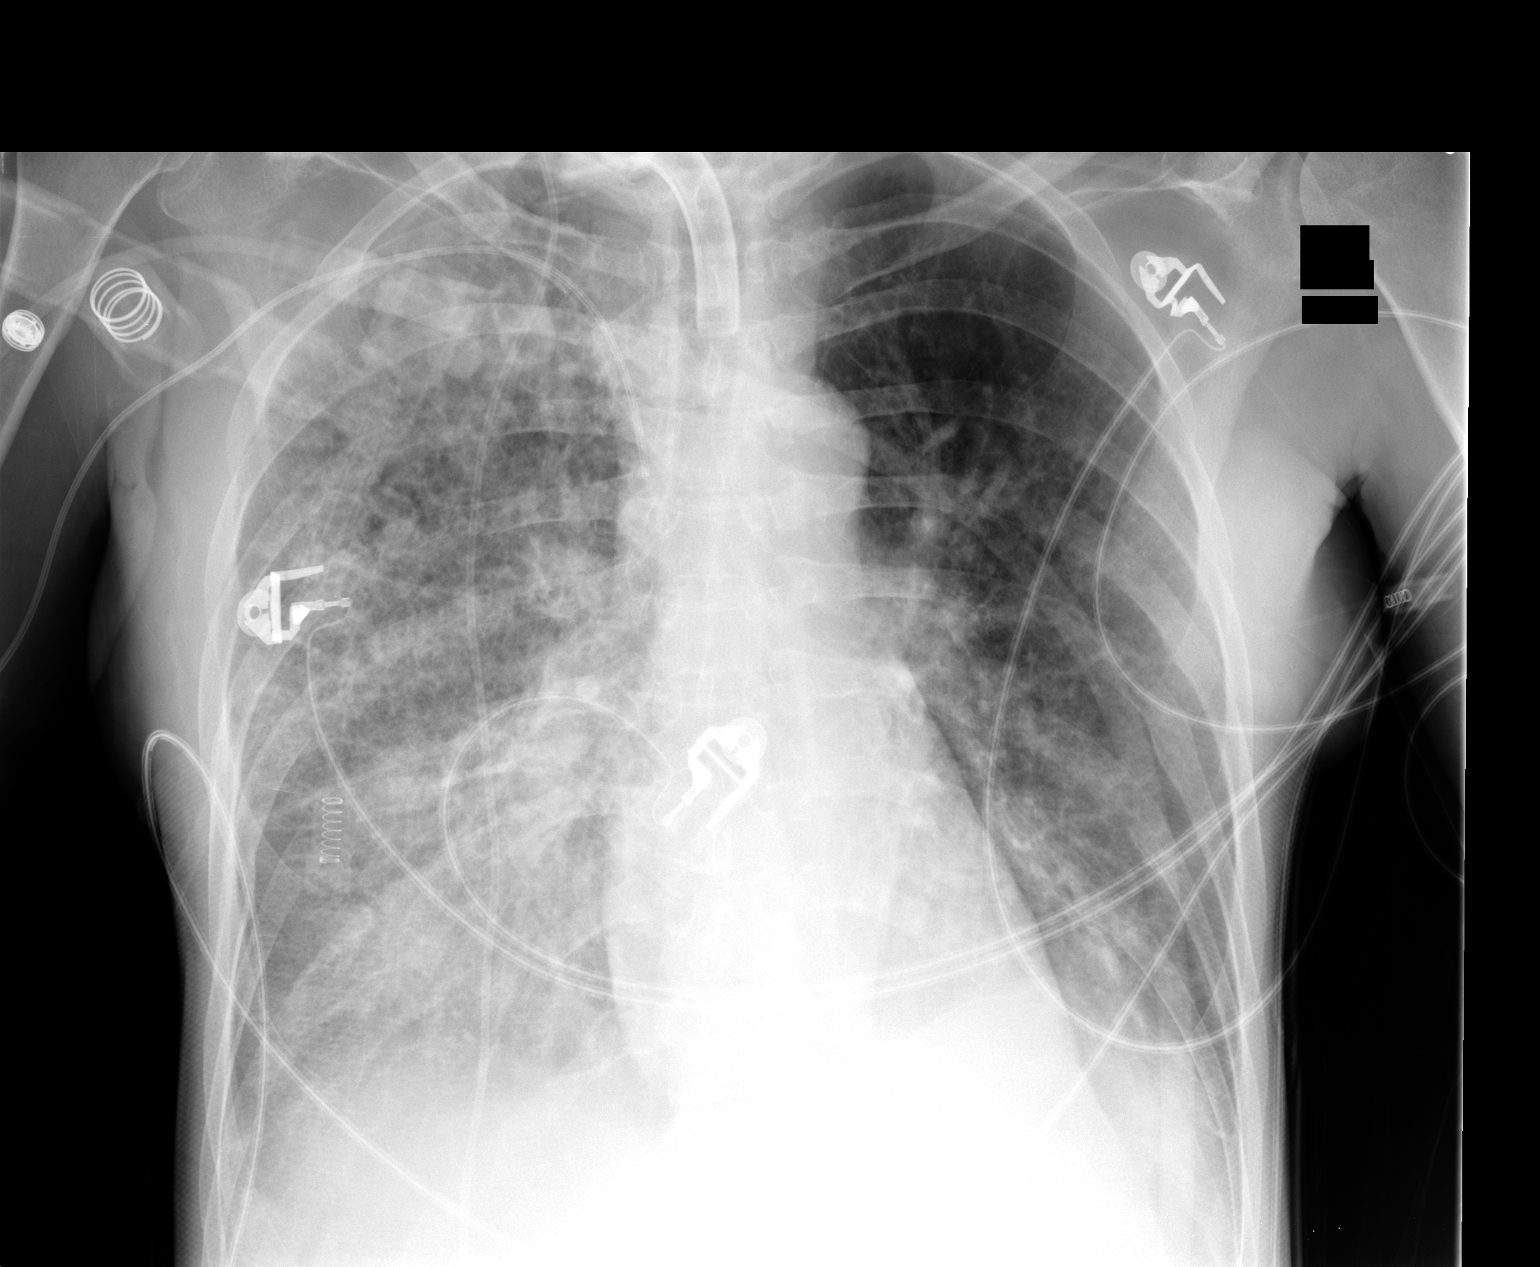

[1 of 1 positions shown; findings below may reference images not displayed]

FINDINGS: Tracheostomy tube tip midline.  Right central line tip mid superior vena cava.  Ventriculoperitoneal shunt overlies right chest. Progressive asymmetric airspace disease, greater on the right than on the left may represent asymmetric pulmonary edema. However, infectious infiltrate cannot be excluded (particularly involving the right lower lung).
IMPRESSION: Progressive asymmetric airspace disease, right greater than left.

## 2006-07-28 IMAGING — CR DG CHEST 1V PORT
1 series · 1 of 1 positions shown · non-contrast
Comparison: none

CLINICAL DATA: Intracranial hemorrhage.  Respiratory failure.
 PORTABLE CHEST - 1 VIEW - 11/01/05 AT 4044 HOURS:

[view not recorded]
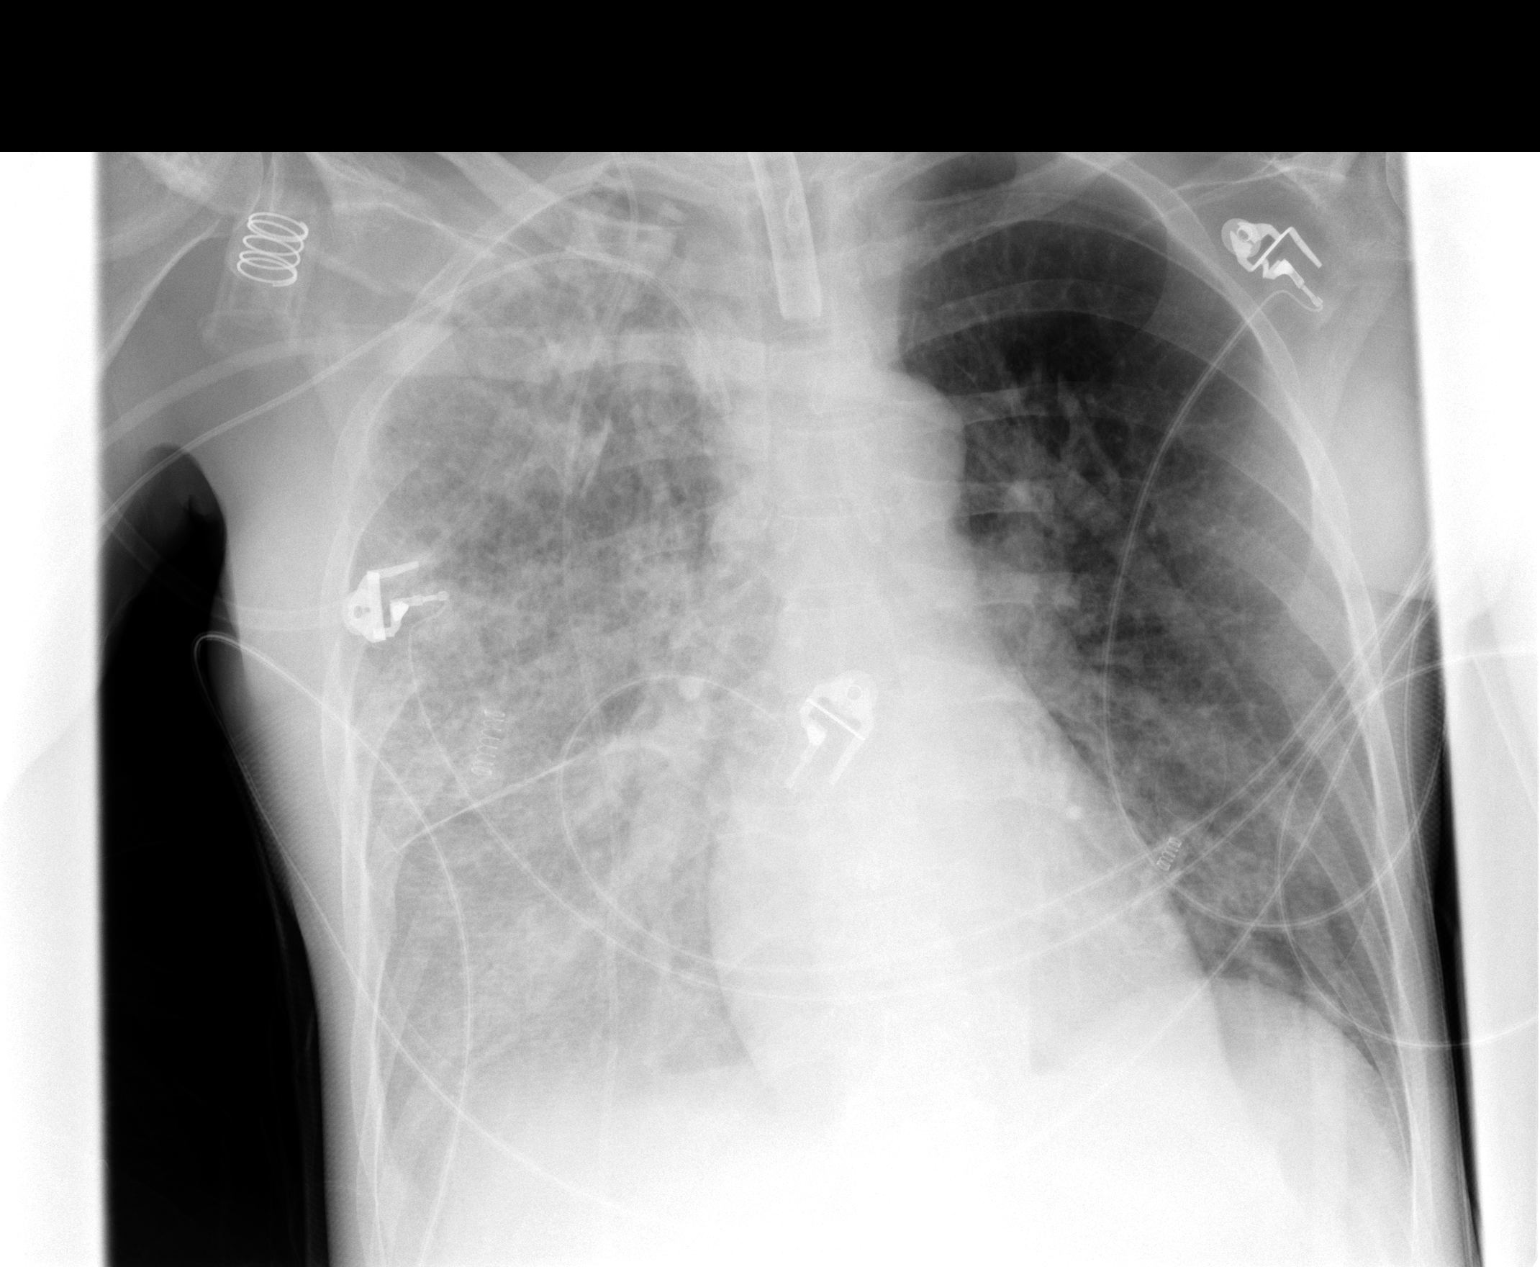

[1 of 1 positions shown; findings below may reference images not displayed]

FINDINGS: There is persistent diffuse asymmetric air space disease throughout the right lung, without significant change from the recent prior study.  There may be a small amount of pleural fluid on the right.  Minimal opacity at the left lung base appears stable.  The cardiomediastinal contours are unchanged.  The tracheostomy, right arm PICC, and ventriculoperitoneal shunt tubing are again noted.
IMPRESSION: No significant change in asymmetric right lung air space disease.  The persistence of this over several days would be less typical for asymmetric edema and suggests the possibility of aspiration or infection.  Correlate clinically.

## 2006-12-09 ENCOUNTER — Ambulatory Visit: Payer: Self-pay | Admitting: Internal Medicine

## 2007-01-21 ENCOUNTER — Observation Stay (HOSPITAL_COMMUNITY): Admission: EM | Admit: 2007-01-21 | Discharge: 2007-01-21 | Payer: Self-pay | Admitting: Emergency Medicine

## 2007-01-21 ENCOUNTER — Ambulatory Visit: Payer: Self-pay | Admitting: Infectious Diseases

## 2007-03-16 ENCOUNTER — Emergency Department (HOSPITAL_COMMUNITY): Admission: EM | Admit: 2007-03-16 | Discharge: 2007-03-16 | Payer: Self-pay | Admitting: Emergency Medicine

## 2007-03-19 ENCOUNTER — Ambulatory Visit: Payer: Self-pay | Admitting: Internal Medicine

## 2007-04-30 DIAGNOSIS — IMO0002 Reserved for concepts with insufficient information to code with codable children: Secondary | ICD-10-CM

## 2007-04-30 DIAGNOSIS — G039 Meningitis, unspecified: Secondary | ICD-10-CM

## 2007-04-30 HISTORY — DX: Reserved for concepts with insufficient information to code with codable children: IMO0002

## 2007-04-30 HISTORY — DX: Meningitis, unspecified: G03.9

## 2007-05-07 ENCOUNTER — Encounter: Admission: RE | Admit: 2007-05-07 | Discharge: 2007-05-07 | Payer: Self-pay | Admitting: Neurosurgery

## 2007-08-10 ENCOUNTER — Ambulatory Visit: Payer: Self-pay | Admitting: Family Medicine

## 2007-08-10 LAB — CONVERTED CEMR LAB
ALT: 50 units/L — ABNORMAL HIGH (ref 0–35)
AST: 40 units/L — ABNORMAL HIGH (ref 0–37)
Albumin: 4.6 g/dL (ref 3.5–5.2)
Alkaline Phosphatase: 164 units/L — ABNORMAL HIGH (ref 39–117)
BUN: 15 mg/dL (ref 6–23)
Basophils Absolute: 0 10*3/uL (ref 0.0–0.1)
Basophils Relative: 0 % (ref 0–1)
CO2: 24 meq/L (ref 19–32)
Calcium: 9.2 mg/dL (ref 8.4–10.5)
Chloride: 106 meq/L (ref 96–112)
Cholesterol: 219 mg/dL — ABNORMAL HIGH (ref 0–200)
Creatinine, Ser: 0.65 mg/dL (ref 0.40–1.20)
Eosinophils Absolute: 0.1 10*3/uL (ref 0.0–0.7)
Eosinophils Relative: 1 % (ref 0–5)
Glucose, Bld: 113 mg/dL — ABNORMAL HIGH (ref 70–99)
HCT: 48.6 % — ABNORMAL HIGH (ref 36.0–46.0)
HDL: 96 mg/dL (ref >39)
Hemoglobin: 15.8 g/dL — ABNORMAL HIGH (ref 12.0–15.0)
LDL Cholesterol: 95 mg/dL (ref 0–99)
Lymphocytes Relative: 21 % (ref 12–46)
Lymphs Abs: 1.5 10*3/uL (ref 0.7–4.0)
MCHC: 32.5 g/dL (ref 30.0–36.0)
MCV: 103.8 fL — ABNORMAL HIGH (ref 78.0–100.0)
Monocytes Absolute: 0.6 10*3/uL (ref 0.1–1.0)
Monocytes Relative: 8 % (ref 3–12)
Neutro Abs: 5.2 10*3/uL (ref 1.7–7.7)
Neutrophils Relative %: 71 % (ref 43–77)
Phenytoin Lvl: 36.8 ug/mL (ref 10.0–20.0)
Platelets: 194 10*3/uL (ref 150–400)
Potassium: 4 meq/L (ref 3.5–5.3)
RBC: 4.68 M/uL (ref 3.87–5.11)
RDW: 13.8 % (ref 11.5–15.5)
Sodium: 146 meq/L — ABNORMAL HIGH (ref 135–145)
Total Bilirubin: 0.5 mg/dL (ref 0.3–1.2)
Total CHOL/HDL Ratio: 2.3
Total Protein: 7.5 g/dL (ref 6.0–8.3)
Triglycerides: 142 mg/dL (ref <150)
VLDL: 28 mg/dL (ref 0–40)
WBC: 7.3 10*3/uL (ref 4.0–10.5)

## 2007-08-20 ENCOUNTER — Ambulatory Visit: Payer: Self-pay | Admitting: Internal Medicine

## 2007-08-20 LAB — CONVERTED CEMR LAB: Phenytoin Lvl: 14.3 ug/mL (ref 10.0–20.0)

## 2007-09-27 ENCOUNTER — Emergency Department (HOSPITAL_COMMUNITY): Admission: EM | Admit: 2007-09-27 | Discharge: 2007-09-28 | Payer: Self-pay | Admitting: Emergency Medicine

## 2007-10-16 IMAGING — CT CT HEAD W/O CM
1 of 2 series · 15 of 30 positions shown, 19 images · IV contrast (agent unspecified)
Comparison: Head CT 10/29/05.

CLINICAL DATA: Seizure disorder. 
HEAD CT WITHOUT CONTRAST:
TECHNIQUE: Contiguous axial images were obtained from the base of the skull through the vertex according to standard protocol without contrast.

[Series 3: recon 2: brain · axial · 0.47mm/px · z∈[+155,+281]mm · 15 of 80 slices shown, 19 images]
[im 5/80  brain]
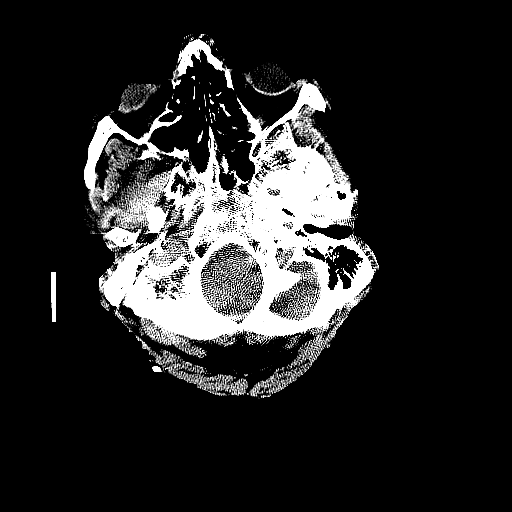
[im 5/80  bone]
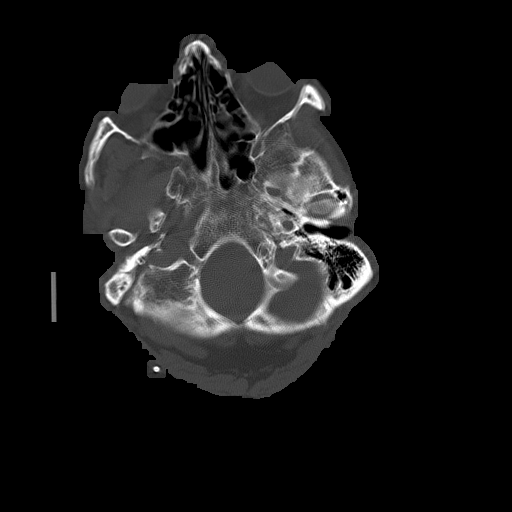
[im 9/80  brain]
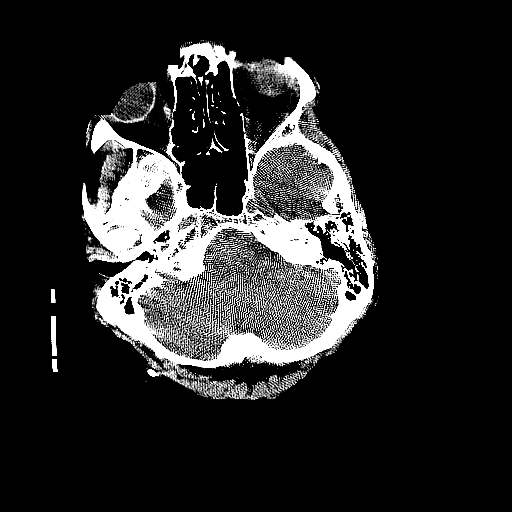
[im 17/80  brain]
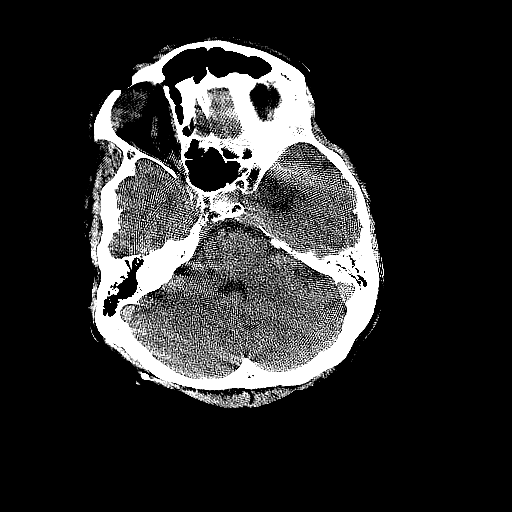
[im 21/80  brain]
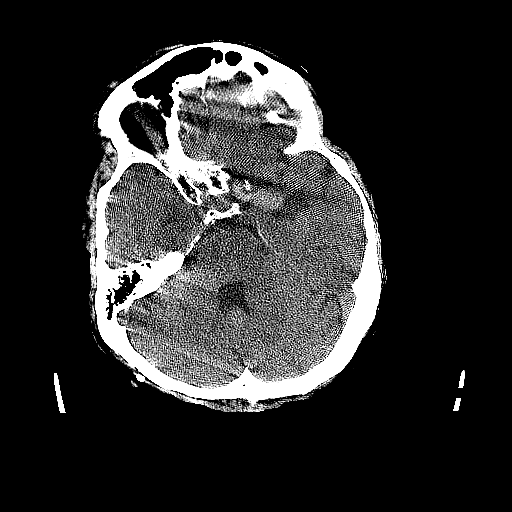
[im 25/80  brain]
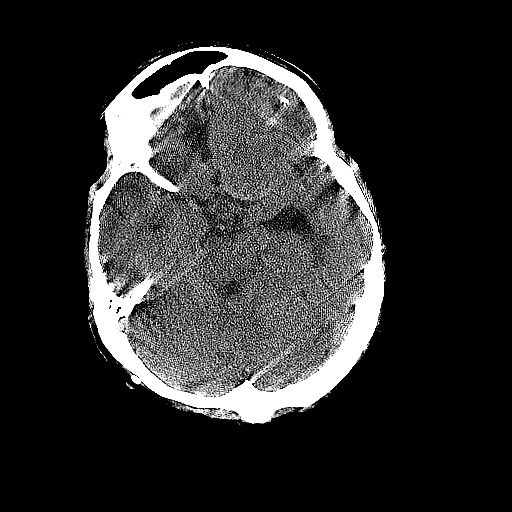
[im 25/80  bone]
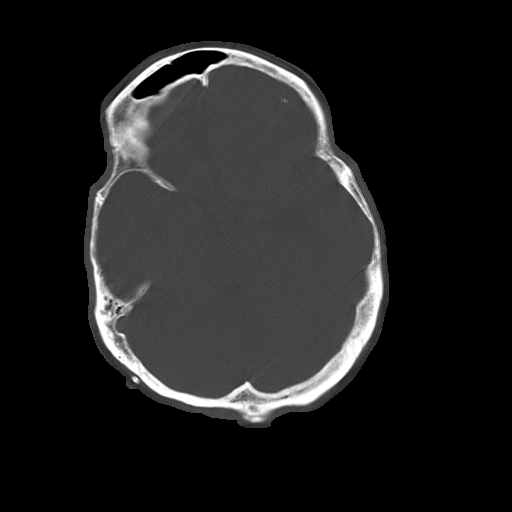
[im 30/80  brain]
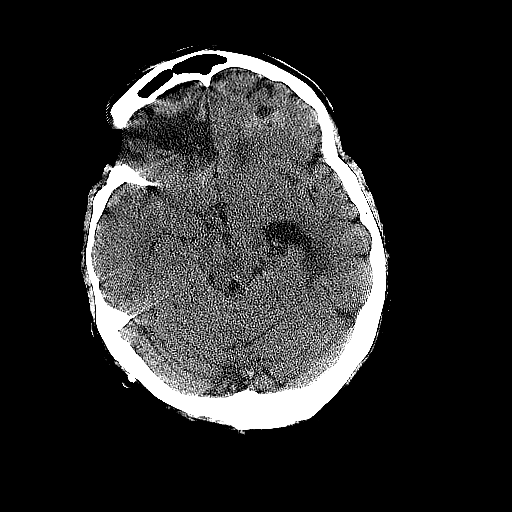
[im 34/80  brain]
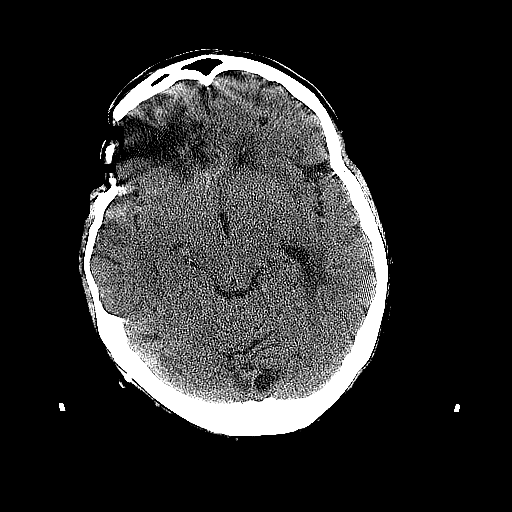
[im 42/80  brain]
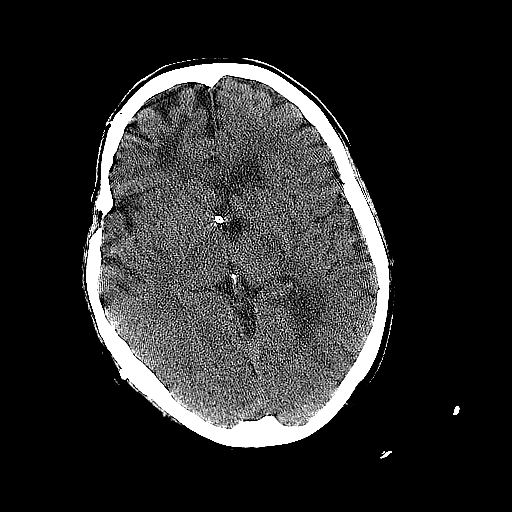
[im 46/80  brain]
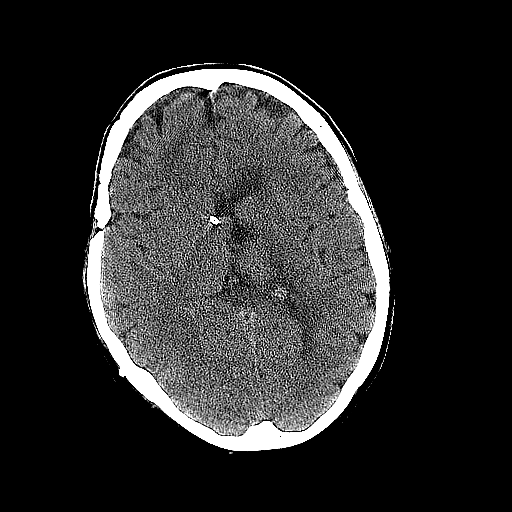
[im 46/80  bone]
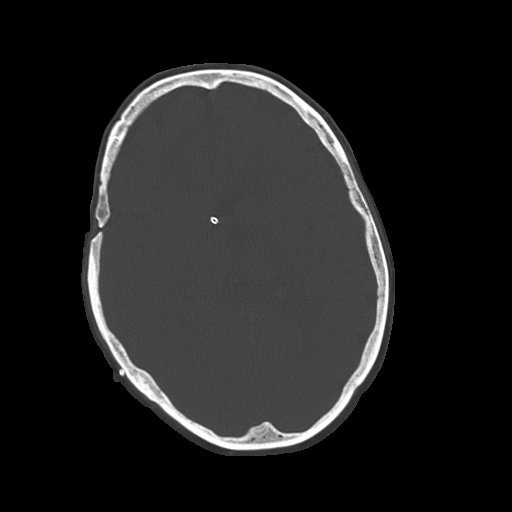
[im 50/80  brain]
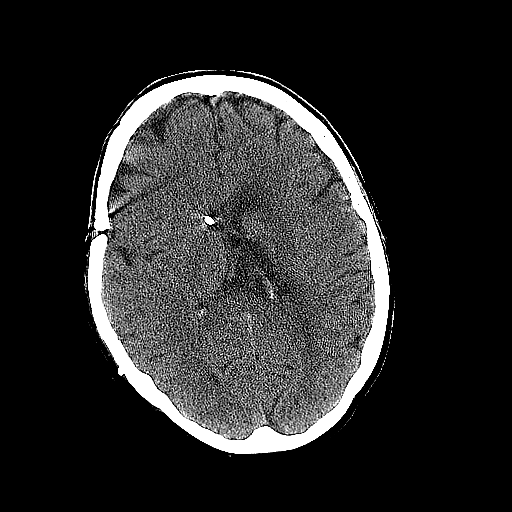
[im 55/80  brain]
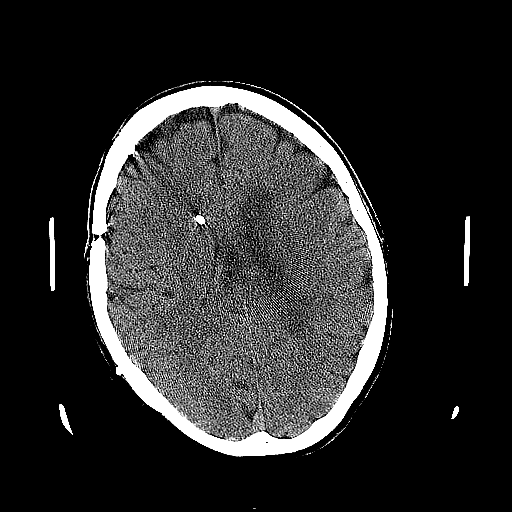
[im 59/80  brain]
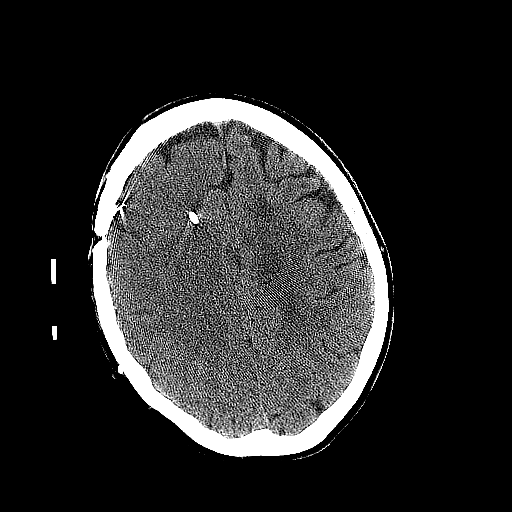
[im 67/80  brain]
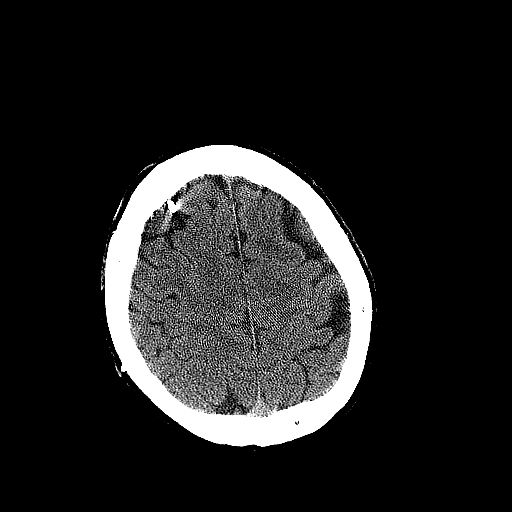
[im 67/80  bone]
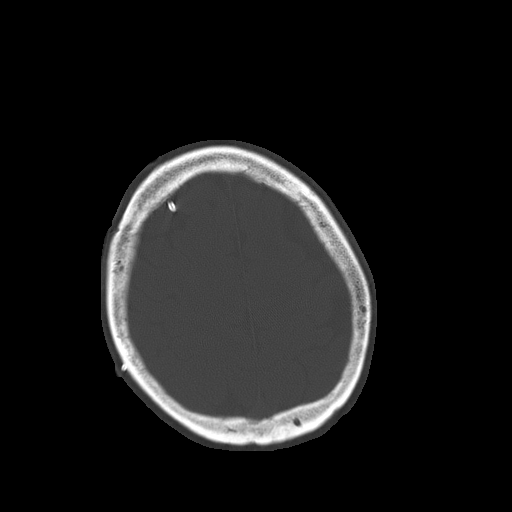
[im 71/80  brain]
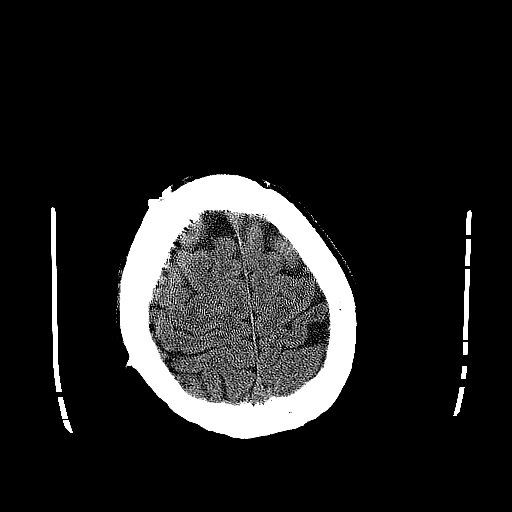
[im 75/80  brain]
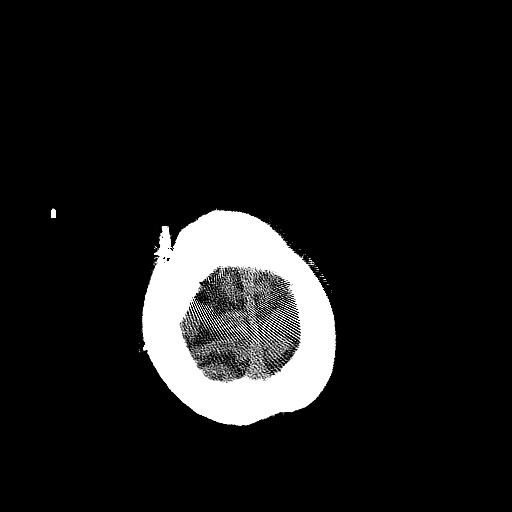

[15 of 30 positions shown; findings below may reference images not displayed]

FINDINGS: There is a right frontal approach ventriculostomy shunt catheter in place with the tip just across the midline, unchanged in position.  There is improved shunting of the ventricular system with decompressed 4th ventricle and markedly decreased size in the temporal horn of the left lateral ventricle. Encephalomalacic changes again seen in the right frontal lobe.  No acute abnormality including hemorrhage, midline shift, infarct or abnormal extra-axial fluid collection is identified.  A small area of calcification in the left frontal lobe is unchanged.    Craniotomy defect is noted.
IMPRESSION: No acute finding with decrease in hydrocephalus in patient with a ventriculostomy shunt catheter in place.  Encephalomalacic change in the right frontal lobe and calcification in the left frontal lobe are unchanged.

## 2007-10-16 IMAGING — CR DG CHEST 1V PORT
1 series · 1 of 1 positions shown · non-contrast
Comparison: 11/01/05.

CLINICAL DATA: Seizure.  Altered level of consciousness. 
 PORTABLE CHEST - 1 VIEW - 01/20/07:

[view not recorded]
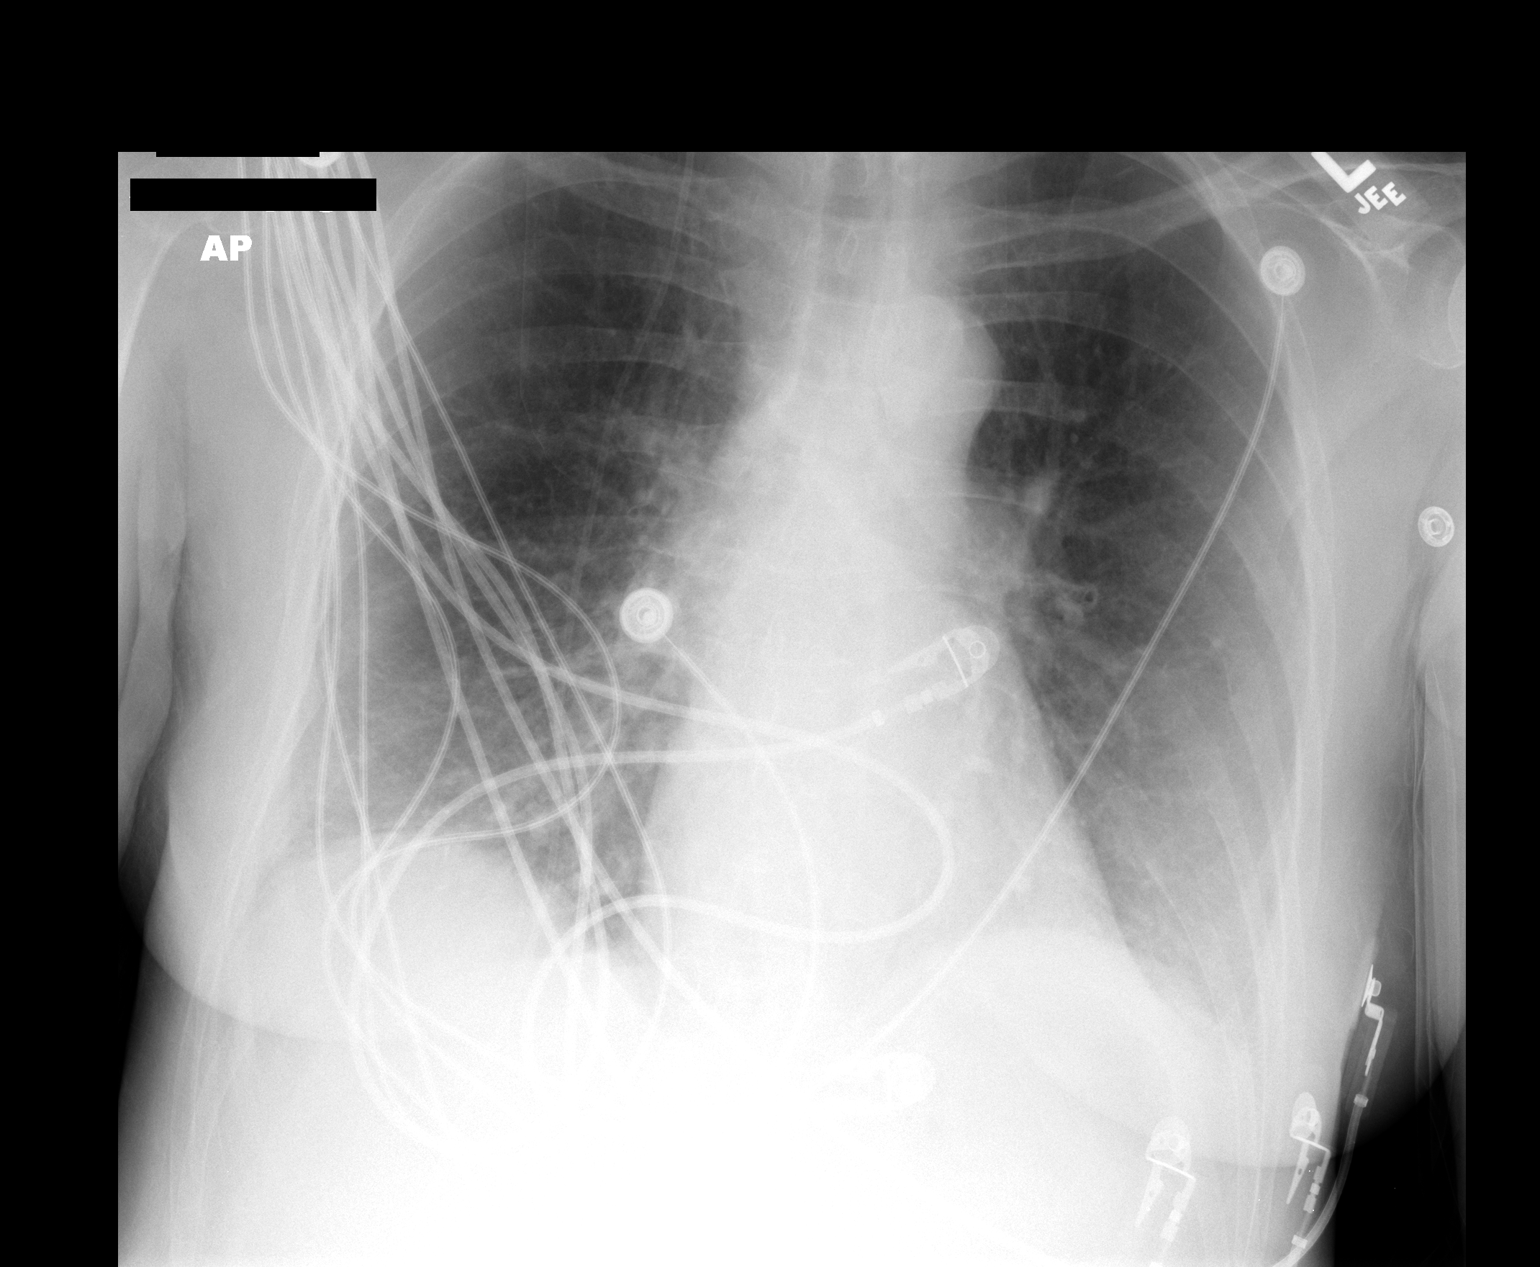

[1 of 1 positions shown; findings below may reference images not displayed]

FINDINGS: A ventriculostomy shunt catheter is noted.  There is patchy air space disease in the left mid and lower lung zones.  Minimal air space opacity in the right lung base is noted.  The heart size is normal.  A small left effusion is present.  Remote left rib fractures are noted.
IMPRESSION: Small left effusion with patchy air space opacity in the lung bases, left worse than right.

## 2007-12-10 IMAGING — CR DG CHEST 2V
2 series · 2 of 2 positions shown · non-contrast
Comparison: 01/20/07.

CLINICAL DATA: Dyspnea and anxiety. 
 CHEST - 2 VIEW:

[w chest pa]
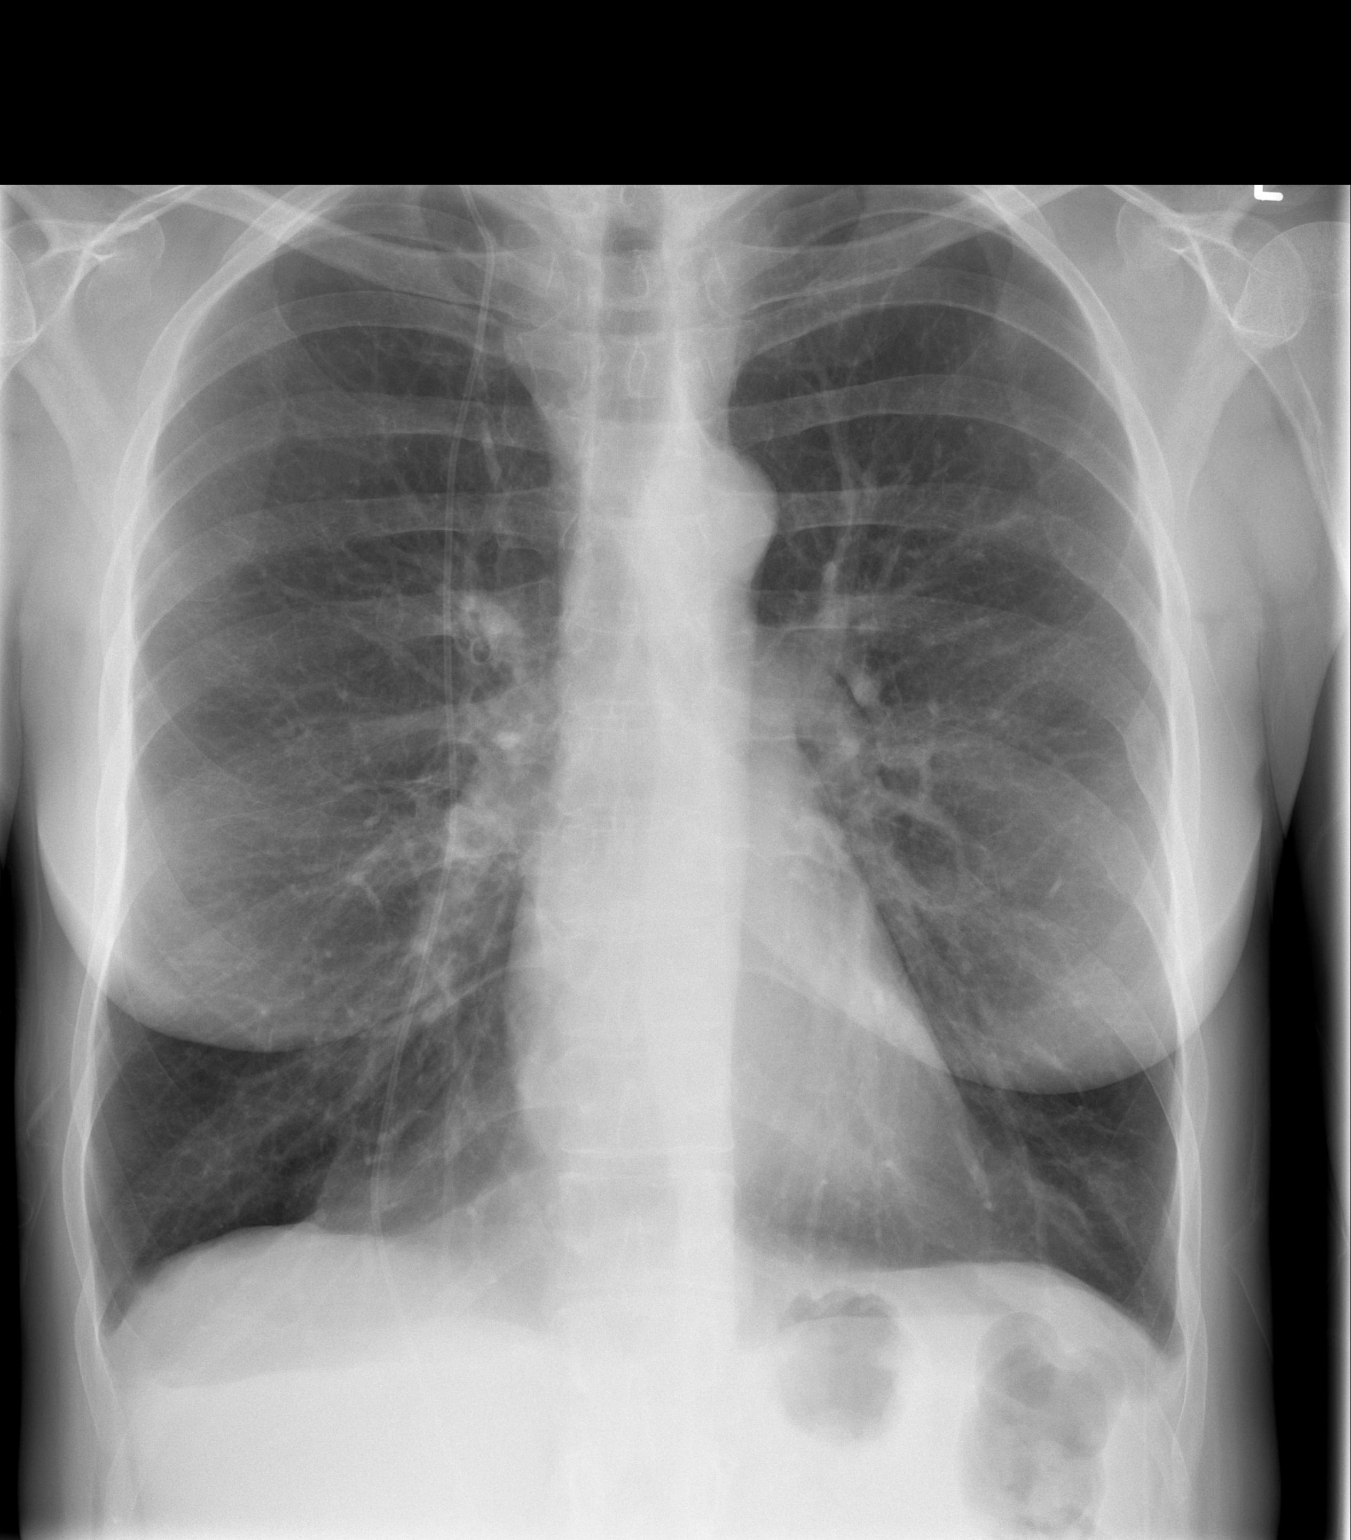

[w chest lat]
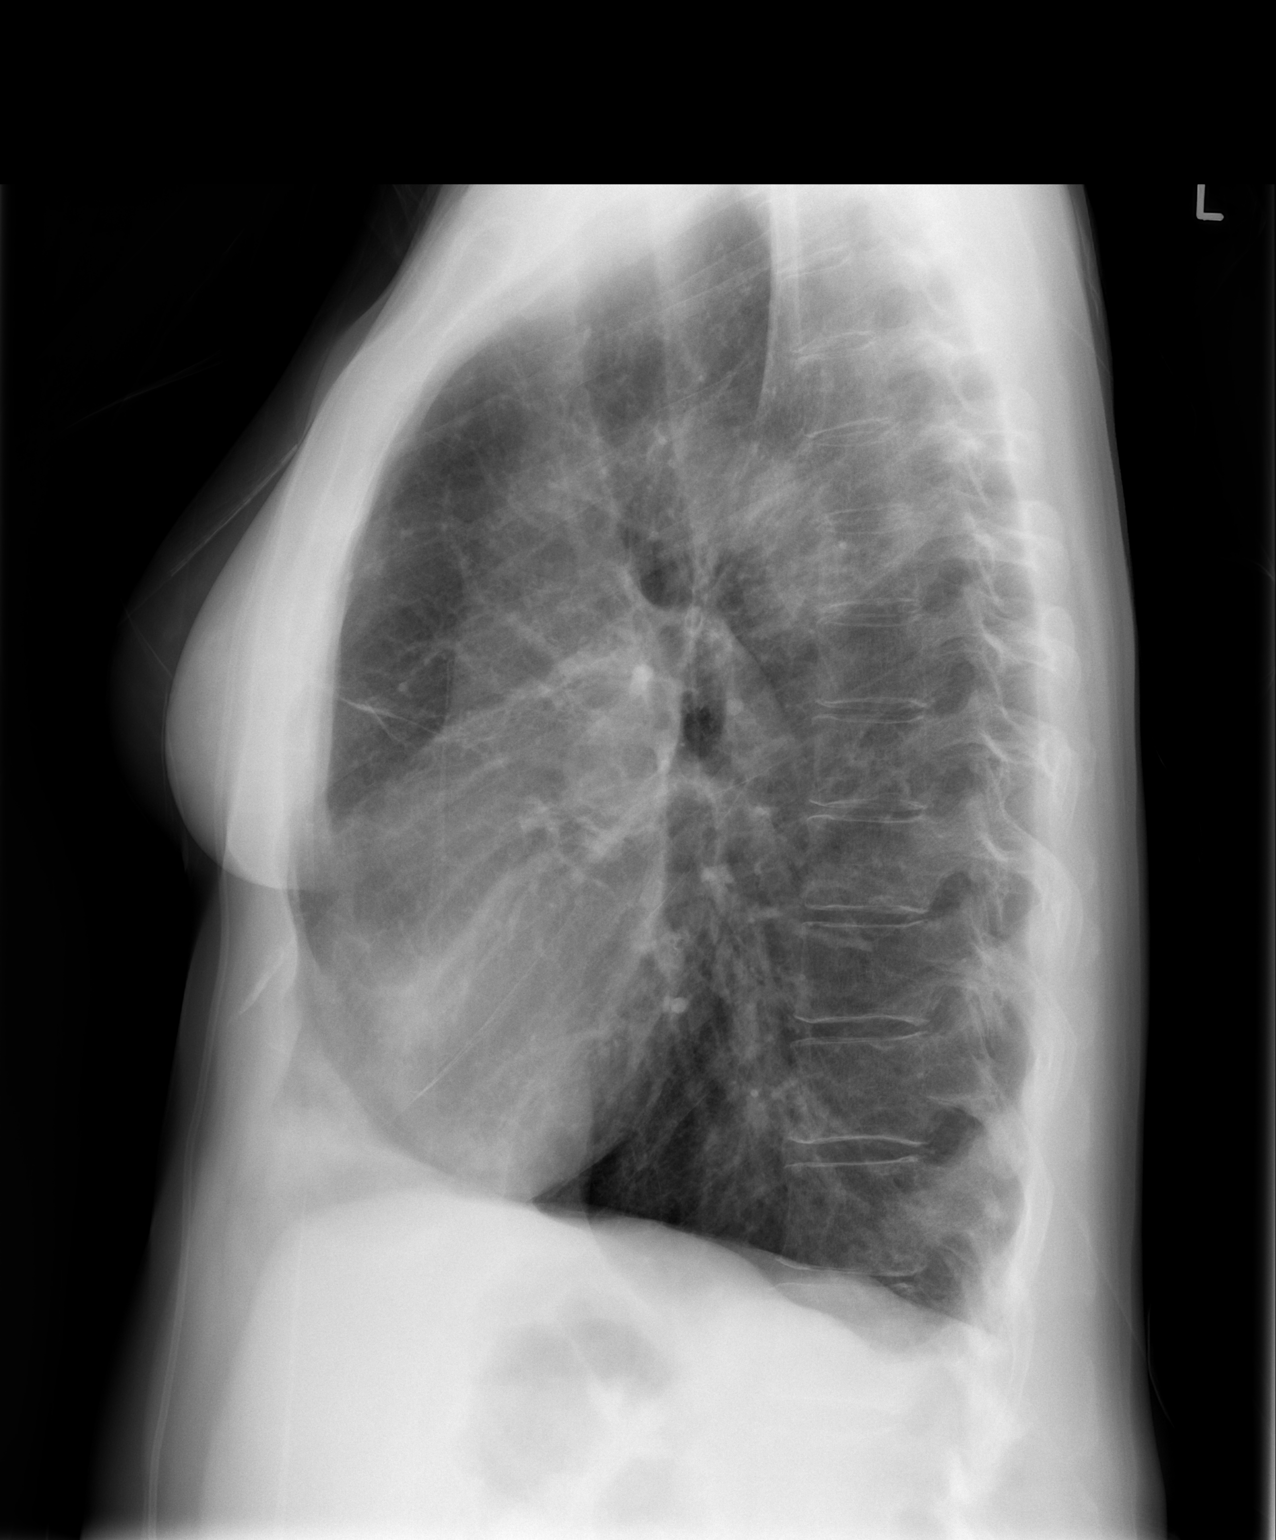

[2 of 2 positions shown; findings below may reference images not displayed]

FINDINGS: The heart size and mediastinal contours are stable.  The lungs are now clear with persistent hyperinflation.  There is no pleural effusion or pneumothorax.  Old rib fractures are noted on the left.  Ventricular peritoneal shunt catheter is noted.
IMPRESSION: 1.  No active cardiopulmonary process.
 2.  Hyperinflated lungs suggesting a degree of obstructive lung disease.

## 2008-01-11 ENCOUNTER — Ambulatory Visit: Payer: Self-pay | Admitting: Internal Medicine

## 2008-01-31 IMAGING — CT CT HEAD W/O CM
2 series · 15 of 30 positions shown, 19 images · IV contrast (agent unspecified)
Comparison: 01/20/07 and earlier.

CLINICAL DATA: 51 year old female with dizziness and falling.  Evaluate for hydrocephalus.  History of ventriculoperitoneal shunt and aneurysm repair.  
CT HEAD WITHOUT CONTRAST:
TECHNIQUE: Contiguous axial images were obtained from the base of the skull through the vertex according to standard protocol without contrast.

[Series 3: bone windows · axial · 0.45mm/px · z∈[+19,+39]mm · 2 of 28 slices shown]
[im 2/28  bone]
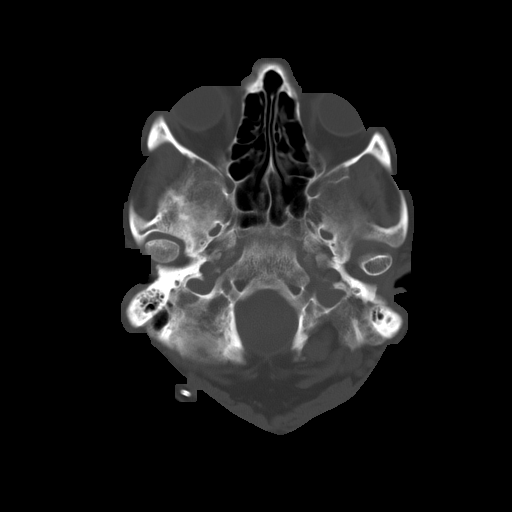
[im 6/28  bone]
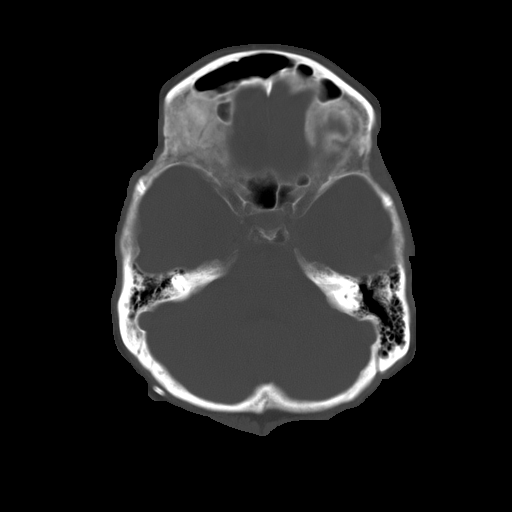

[Series 32: 3d filtered head w/o · axial · non-contrast · 0.45mm/px · z∈[+19,+141]mm · 13 of 28 slices shown, 17 images]
[im 2/28  brain]
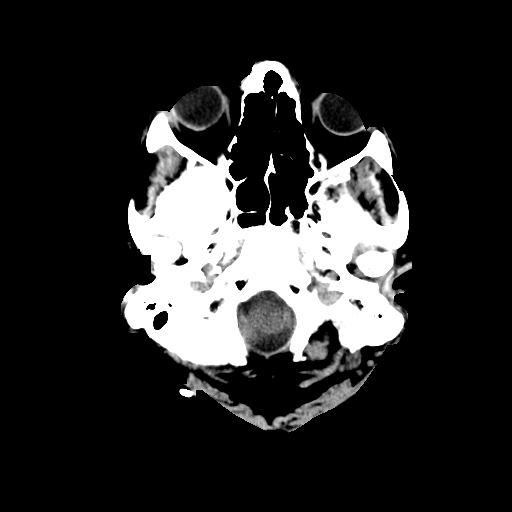
[im 2/28  bone]
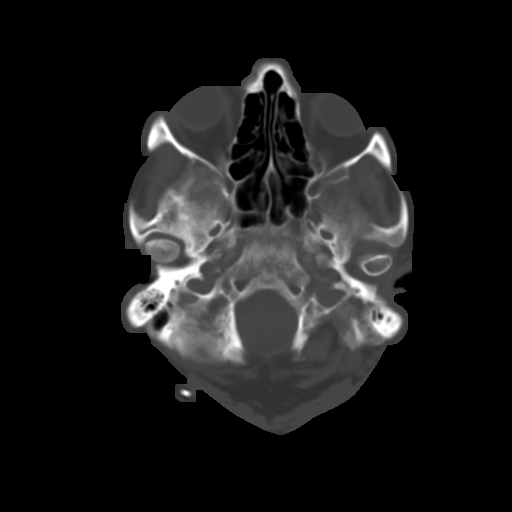
[im 4/28  brain]
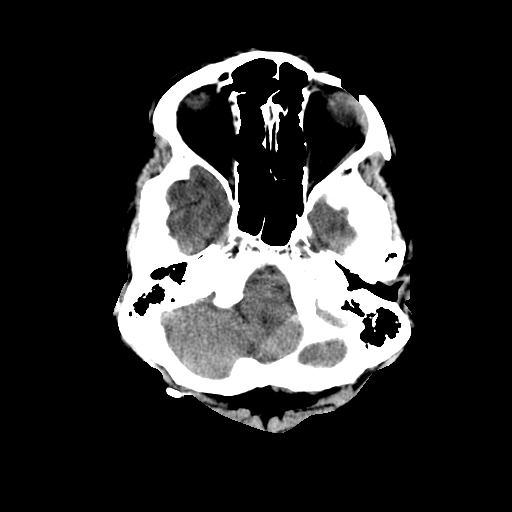
[im 6/28  brain]
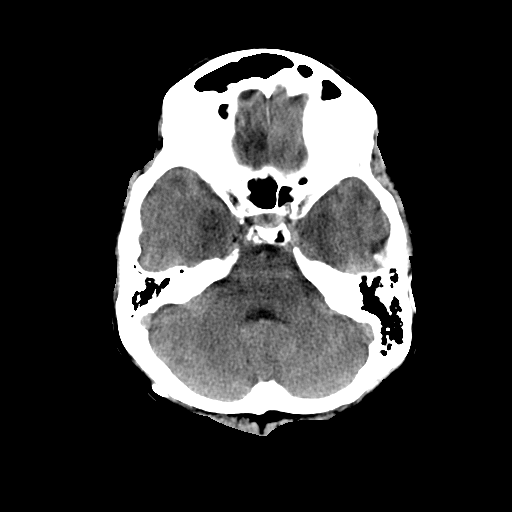
[im 8/28  brain]
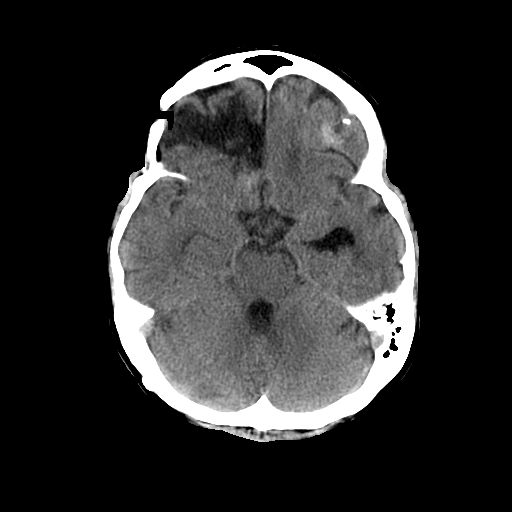
[im 10/28  brain]
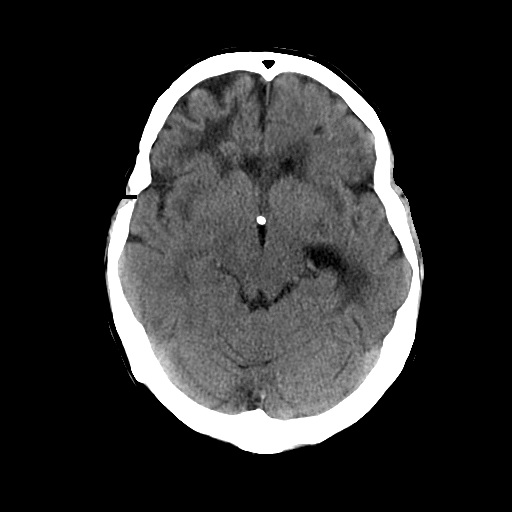
[im 10/28  bone]
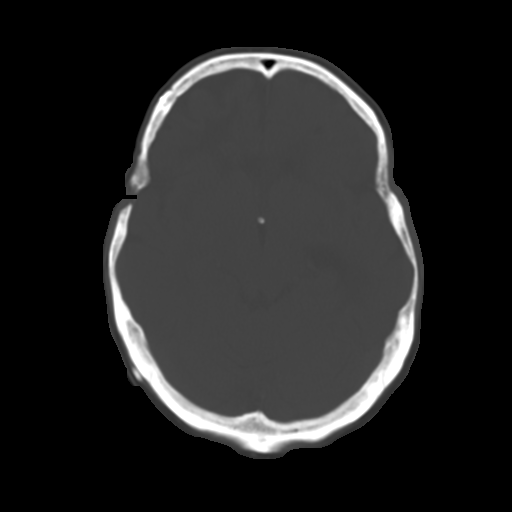
[im 12/28  brain]
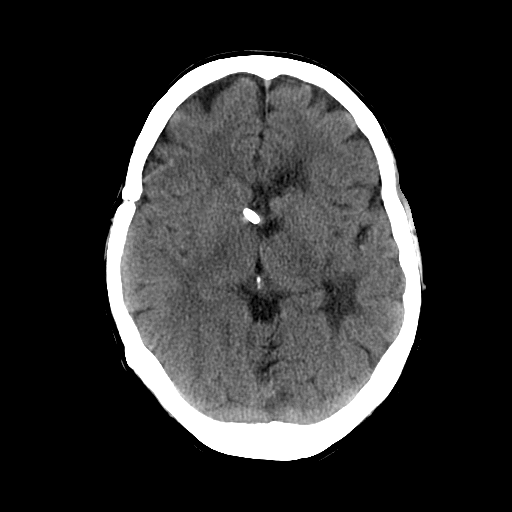
[im 14/28  brain]
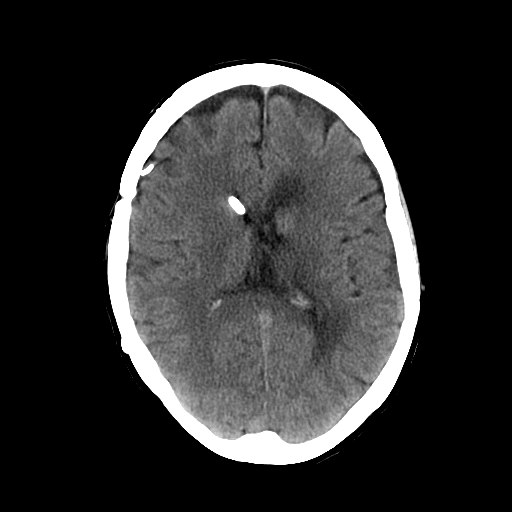
[im 16/28  brain]
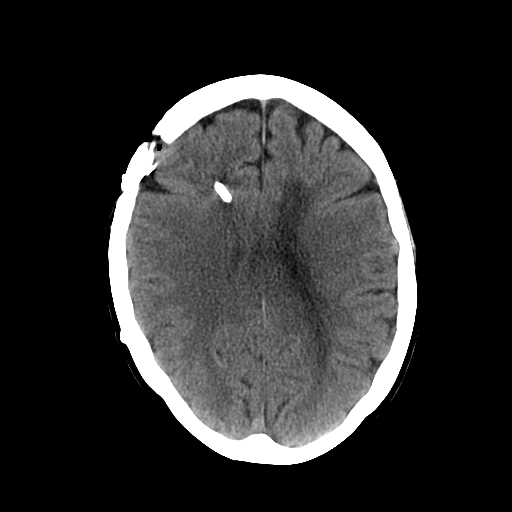
[im 18/28  brain]
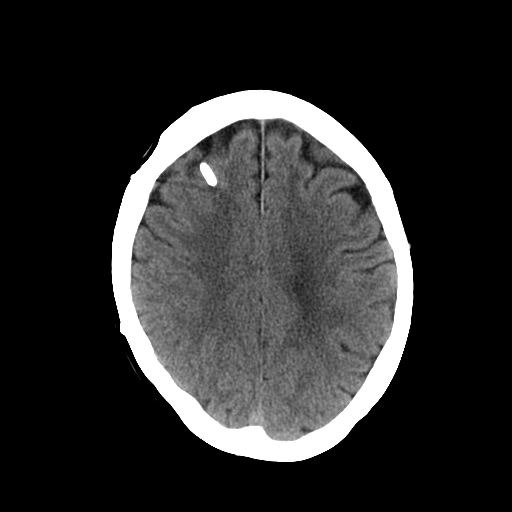
[im 18/28  bone]
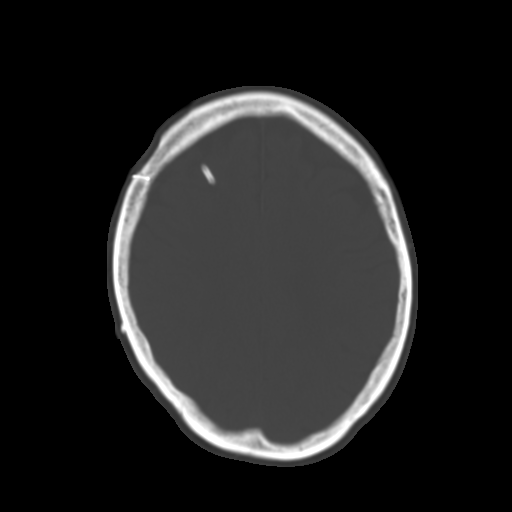
[im 20/28  brain]
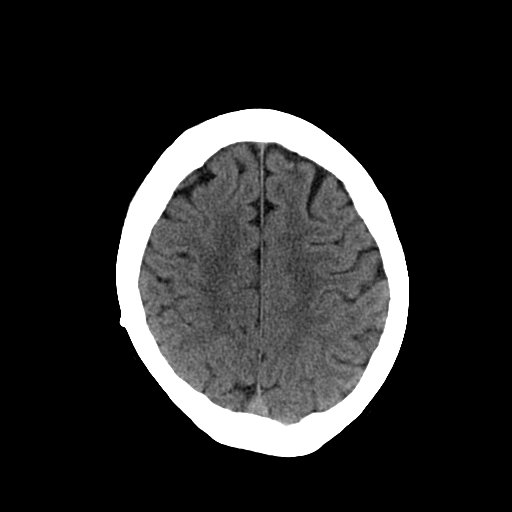
[im 22/28  brain]
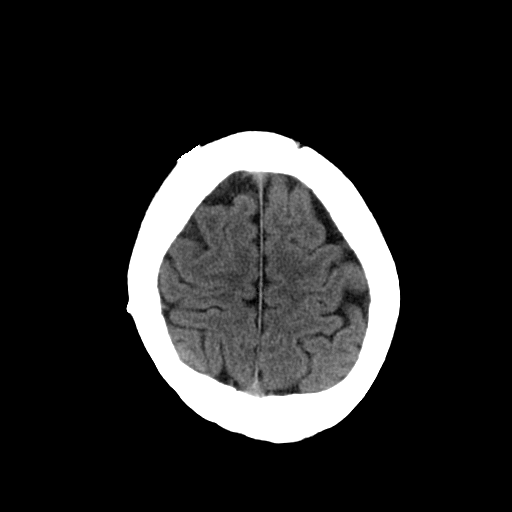
[im 24/28  brain]
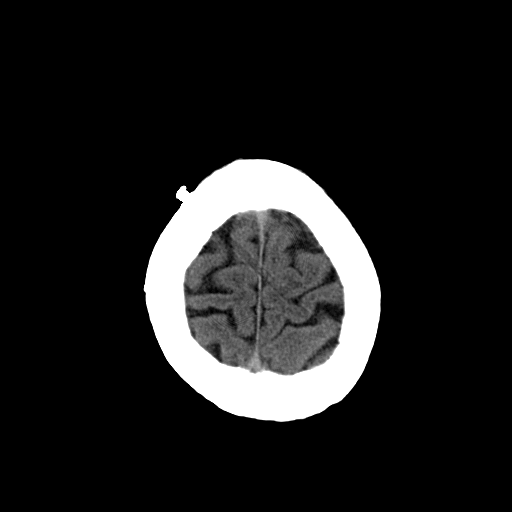
[im 26/28  brain]
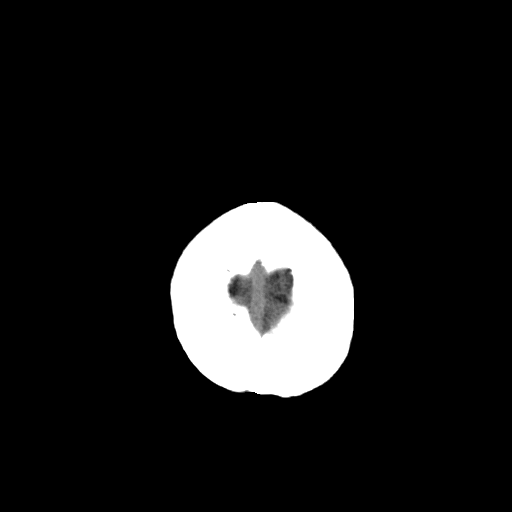
[im 26/28  bone]
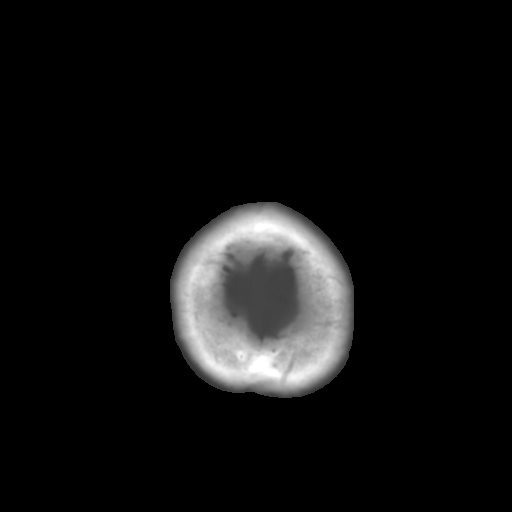

[15 of 30 positions shown; findings below may reference images not displayed]

FINDINGS: Stable right frontal approach ventriculostomy shunt catheter terminating in the third ventricle anteriorly.  The lateral ventricles rema[REDACTED]ompressed except for the left temporal horn which is stable to mildly diminished in size since the prior exam.  Fourth ventricle size is normal and unchanged.  Basilar cisterns are patent.  The shunt is connected to a reservoir and catheter tubing over the right frontal convexity.  Catheter tubing extends in the right lateral and posterior neck soft tissues and its visualized course is intact.  Orbits are normal.  Scalp soft tissues are otherwise normal.  Sequela of right frontotemporal craniotomy are noted with extensive underlying right inferior frontal gyrus encephalomalacia and possible chronic encephalomalacia of the right anterior temporal tip.  There is hypodensity extending in the left periventricular white matter which is unchanged and may reflect additional encephalomalacia.  There is no midline shift.  The left inferior frontal gyrus is also abnormal with areas of mixed hyper and hypodensity which may reflect chronic hemorrhagic contusion and encephalomalacia vs encephalomalacia with calcification.  No acute cerebral edema, mass effect, or intracranial hemorrhage is identified.  No evidence of acute cortically based infarct.  Visualized paranasal sinuses and mastoids are clear.  No acute osseous abnormality.
IMPRESSION: 1.  No acute intracranial abnormality.  
2.  Stable ventriculostomy shunt, ventricle size and configuration. 
3.  Stable multifocal encephalomalacia.

## 2008-02-09 ENCOUNTER — Encounter (INDEPENDENT_AMBULATORY_CARE_PROVIDER_SITE_OTHER): Payer: Self-pay | Admitting: Internal Medicine

## 2008-02-09 ENCOUNTER — Ambulatory Visit: Payer: Self-pay | Admitting: Family Medicine

## 2008-02-09 LAB — CONVERTED CEMR LAB
ALT: 48 units/L — ABNORMAL HIGH (ref 0–35)
AST: 39 units/L — ABNORMAL HIGH (ref 0–37)
Albumin: 4.2 g/dL (ref 3.5–5.2)
Alkaline Phosphatase: 130 units/L — ABNORMAL HIGH (ref 39–117)
Amphetamine Screen, Ur: NEGATIVE
BUN: 16 mg/dL (ref 6–23)
Barbiturate Quant, Ur: NEGATIVE
Basophils Absolute: 0 10*3/uL (ref 0.0–0.1)
Basophils Relative: 0 % (ref 0–1)
Benzodiazepines.: NEGATIVE
CO2: 25 meq/L (ref 19–32)
Calcium: 8.9 mg/dL (ref 8.4–10.5)
Chloride: 102 meq/L (ref 96–112)
Cocaine Metabolites: POSITIVE — AB
Creatinine, Ser: 0.73 mg/dL (ref 0.40–1.20)
Creatinine,U: 142.6 mg/dL
Eosinophils Absolute: 0.1 10*3/uL (ref 0.0–0.7)
Eosinophils Relative: 2 % (ref 0–5)
Glucose, Bld: 116 mg/dL — ABNORMAL HIGH (ref 70–99)
HCT: 45.9 % (ref 36.0–46.0)
Hemoglobin: 15.9 g/dL — ABNORMAL HIGH (ref 12.0–15.0)
Lymphocytes Relative: 23 % (ref 12–46)
Lymphs Abs: 1.6 10*3/uL (ref 0.7–4.0)
MCHC: 34.6 g/dL (ref 30.0–36.0)
MCV: 102.5 fL — ABNORMAL HIGH (ref 78.0–100.0)
Marijuana Metabolite: NEGATIVE
Methadone: NEGATIVE
Monocytes Absolute: 0.6 10*3/uL (ref 0.1–1.0)
Monocytes Relative: 9 % (ref 3–12)
Neutro Abs: 4.8 10*3/uL (ref 1.7–7.7)
Neutrophils Relative %: 67 % (ref 43–77)
Opiate Screen, Urine: NEGATIVE
Phencyclidine (PCP): NEGATIVE
Phenytoin Lvl: 10.4 ug/mL (ref 10.0–20.0)
Platelets: 202 10*3/uL (ref 150–400)
Potassium: 4.3 meq/L (ref 3.5–5.3)
Propoxyphene: NEGATIVE
RBC: 4.48 M/uL (ref 3.87–5.11)
RDW: 13.1 % (ref 11.5–15.5)
Sodium: 141 meq/L (ref 135–145)
Total Bilirubin: 0.4 mg/dL (ref 0.3–1.2)
Total Protein: 6.6 g/dL (ref 6.0–8.3)
WBC: 7.2 10*3/uL (ref 4.0–10.5)

## 2008-02-26 ENCOUNTER — Ambulatory Visit: Payer: Self-pay | Admitting: Family Medicine

## 2008-06-23 IMAGING — CT CT HEAD W/O CM
1 of 2 series · 15 of 30 positions shown, 19 images · non-contrast
Comparison: Head CT 05/07/2007.

CLINICAL DATA: Head injury tonight.  History of shunt.

CT HEAD WITHOUT CONTRAST
TECHNIQUE: Contiguous axial images were obtained from the base of
the skull through the vertex without contrast.

[Series 3: recon 2: brain · axial · 0.47mm/px · z∈[+119,+265]mm · 15 of 64 slices shown, 19 images]
[im 4/64  brain]
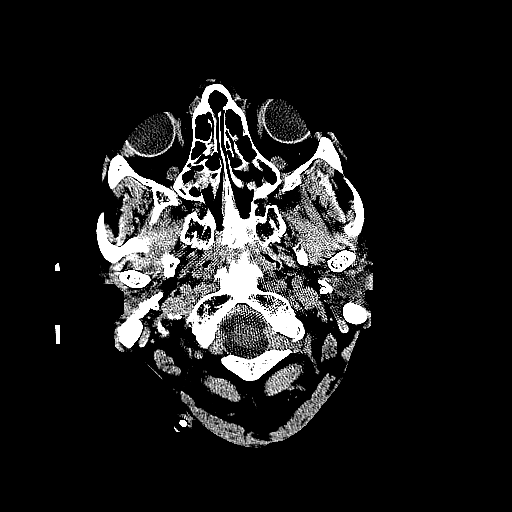
[im 4/64  bone]
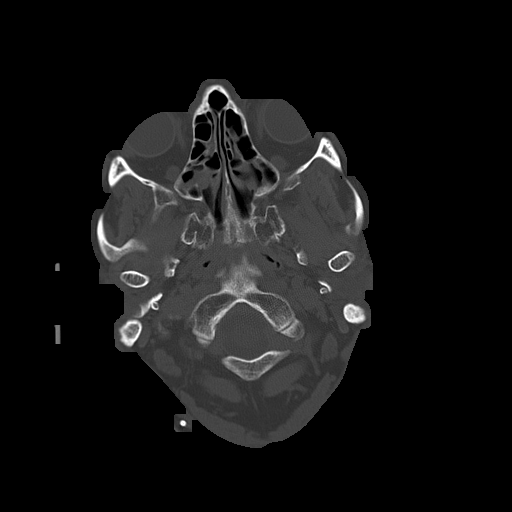
[im 7/64  brain]
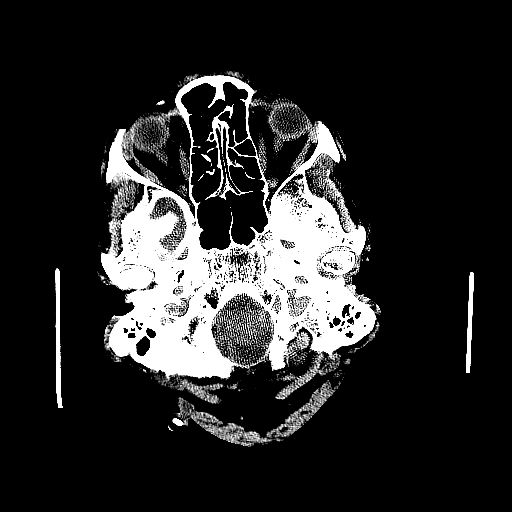
[im 14/64  brain]
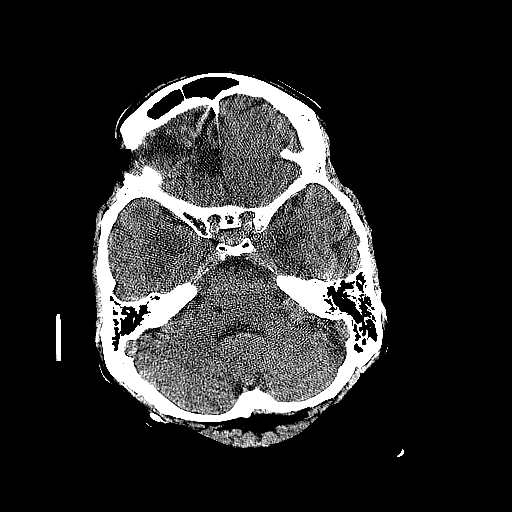
[im 17/64  brain]
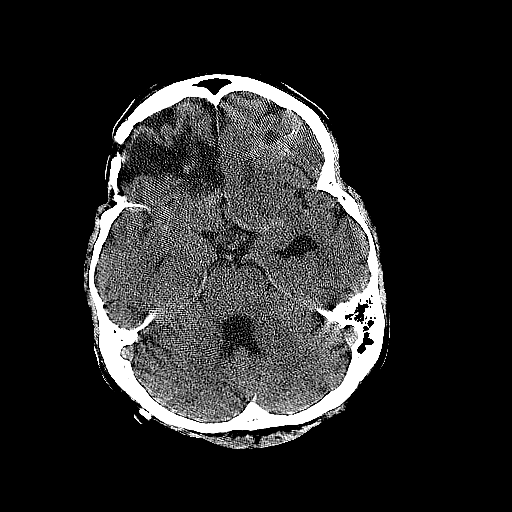
[im 20/64  brain]
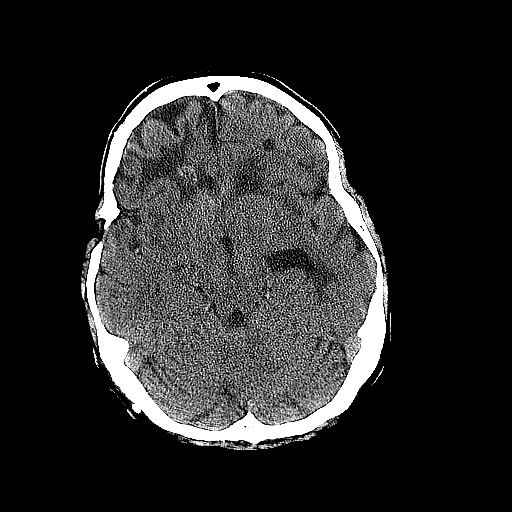
[im 20/64  bone]
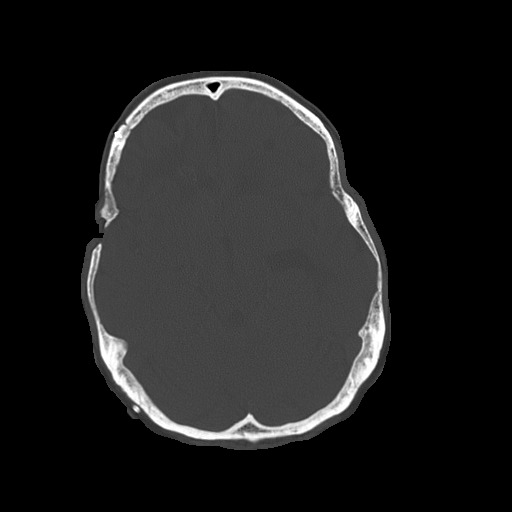
[im 24/64  brain]
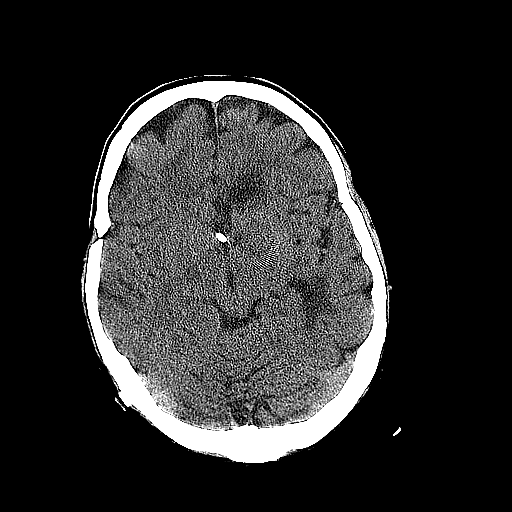
[im 27/64  brain]
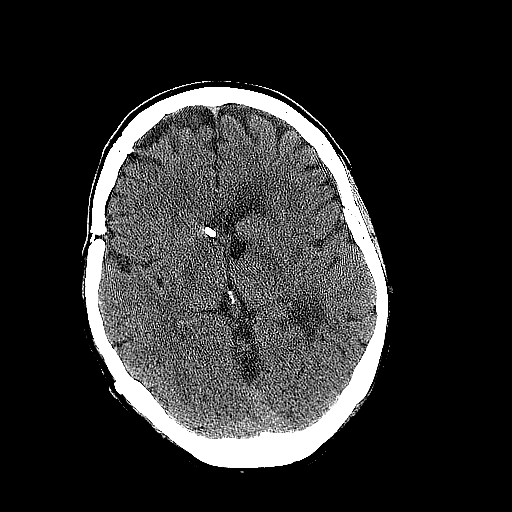
[im 34/64  brain]
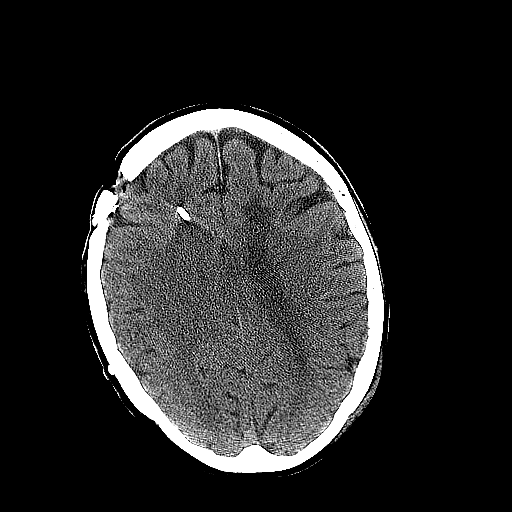
[im 37/64  brain]
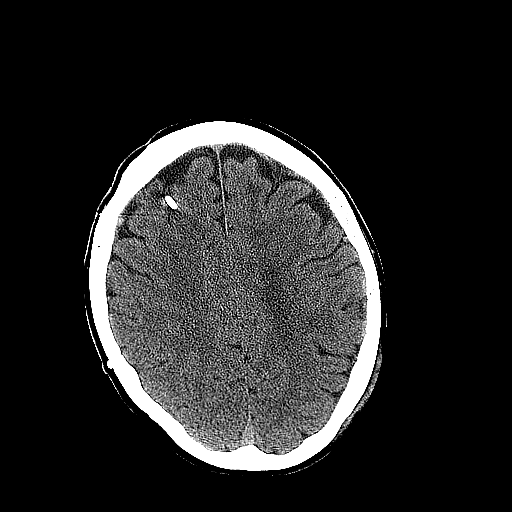
[im 37/64  bone]
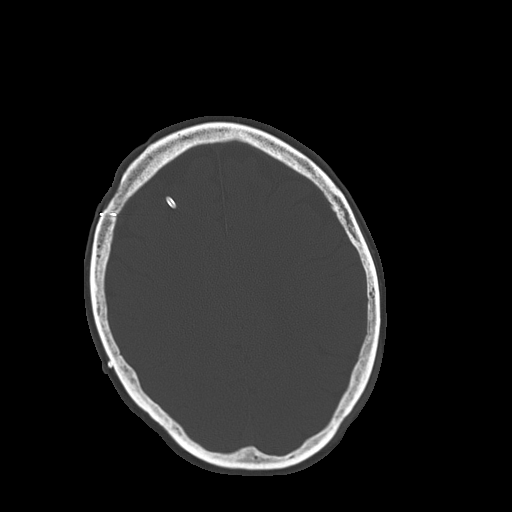
[im 40/64  brain]
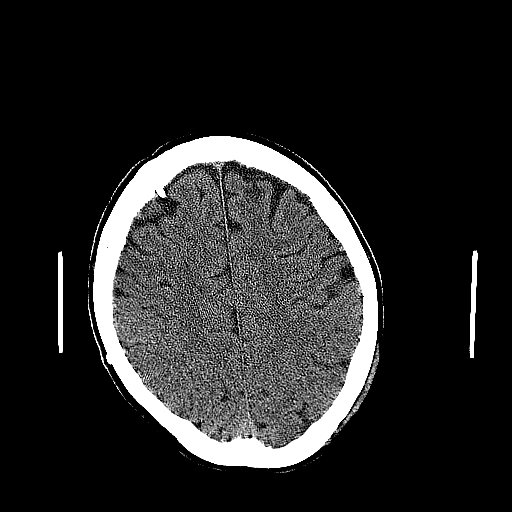
[im 44/64  brain]
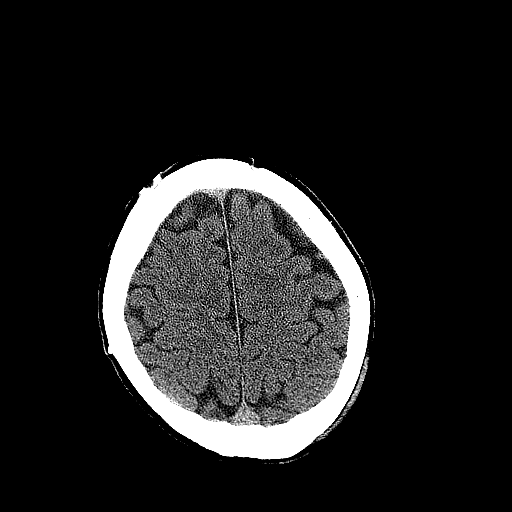
[im 47/64  brain]
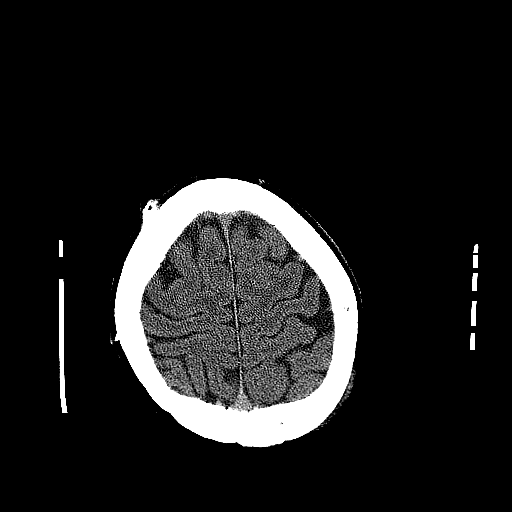
[im 54/64  brain]
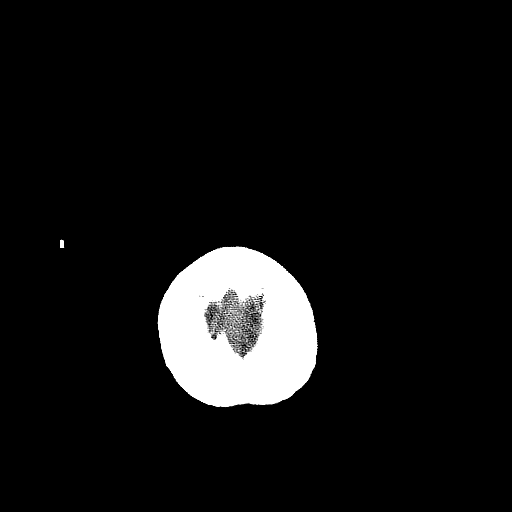
[im 54/64  bone]
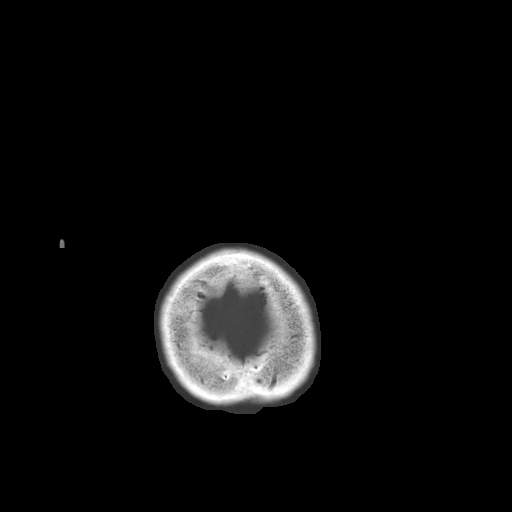
[im 57/64  brain]
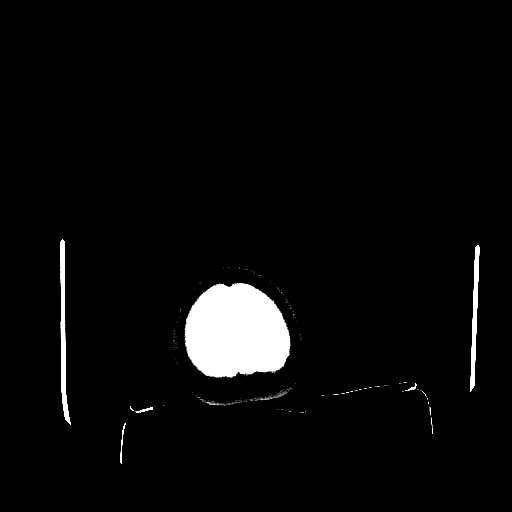
[im 60/64  brain]
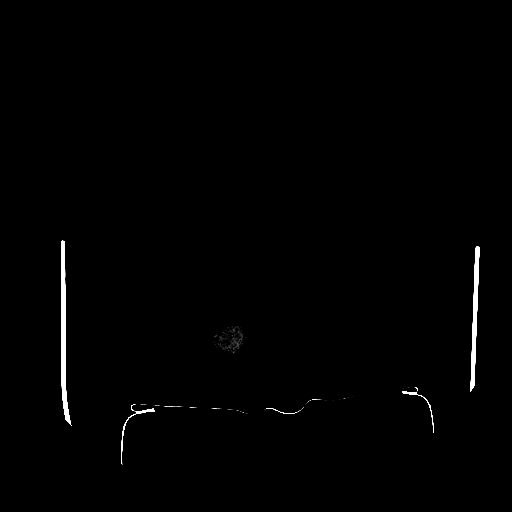

[15 of 30 positions shown; findings below may reference images not displayed]

FINDINGS: Post operative changes status post right frontal temporal
craniotomy and ventriculoperitoneal shunt placement appear stable.
The shunt is in stable position with its tip in the third
ventricle.  Ventricular system appears stable with stable
dilatation of the left temporal horn.  Bifrontal encephalomalacia
and associated calcification are unchanged.  There is stable
encephalomalacia anteriorly in the left aspect of the thalamus.  No
acute intracranial hemorrhage, midline shift, brain edema or extra-
axial fluid collection is seen.

The visualized paranasal sinuses are clear aside from mild ethmoid
sinus mucosal thickening. There is no evidence of acute calvarial
fracture
IMPRESSION: Stable postoperative appearance of the brain.
No acute intracranial findings or evidence of calvarial fracture.
Mild ethmoid sinus mucosal thickening.

## 2008-08-24 ENCOUNTER — Encounter (INDEPENDENT_AMBULATORY_CARE_PROVIDER_SITE_OTHER): Payer: Self-pay | Admitting: Adult Health

## 2008-08-24 ENCOUNTER — Ambulatory Visit: Payer: Self-pay | Admitting: Internal Medicine

## 2008-08-24 LAB — CONVERTED CEMR LAB
ALT: 49 units/L — ABNORMAL HIGH (ref 0–35)
AST: 34 units/L (ref 0–37)
Albumin: 4.2 g/dL (ref 3.5–5.2)
Alkaline Phosphatase: 116 units/L (ref 39–117)
BUN: 10 mg/dL (ref 6–23)
Basophils Absolute: 0 10*3/uL (ref 0.0–0.1)
Basophils Relative: 0 % (ref 0–1)
CO2: 25 meq/L (ref 19–32)
Calcium: 8.9 mg/dL (ref 8.4–10.5)
Chloride: 107 meq/L (ref 96–112)
Creatinine, Ser: 0.58 mg/dL (ref 0.40–1.20)
Eosinophils Absolute: 0.2 10*3/uL (ref 0.0–0.7)
Eosinophils Relative: 2 % (ref 0–5)
Glucose, Bld: 106 mg/dL — ABNORMAL HIGH (ref 70–99)
HCT: 45.4 % (ref 36.0–46.0)
Hemoglobin: 15.1 g/dL — ABNORMAL HIGH (ref 12.0–15.0)
Lymphocytes Relative: 21 % (ref 12–46)
Lymphs Abs: 1.6 10*3/uL (ref 0.7–4.0)
MCHC: 33.3 g/dL (ref 30.0–36.0)
MCV: 99.3 fL (ref 78.0–100.0)
Monocytes Absolute: 0.6 10*3/uL (ref 0.1–1.0)
Monocytes Relative: 8 % (ref 3–12)
Neutro Abs: 5.2 10*3/uL (ref 1.7–7.7)
Neutrophils Relative %: 69 % (ref 43–77)
Phenytoin Lvl: 8.9 ug/mL — ABNORMAL LOW (ref 10.0–20.0)
Platelets: 192 10*3/uL (ref 150–400)
Potassium: 4 meq/L (ref 3.5–5.3)
RBC: 4.57 M/uL (ref 3.87–5.11)
RDW: 13.2 % (ref 11.5–15.5)
Sodium: 142 meq/L (ref 135–145)
Total Bilirubin: 0.4 mg/dL (ref 0.3–1.2)
Total Protein: 6.6 g/dL (ref 6.0–8.3)
WBC: 7.5 10*3/uL (ref 4.0–10.5)

## 2008-08-30 ENCOUNTER — Ambulatory Visit: Payer: Self-pay | Admitting: Internal Medicine

## 2008-11-03 ENCOUNTER — Encounter (INDEPENDENT_AMBULATORY_CARE_PROVIDER_SITE_OTHER): Payer: Self-pay | Admitting: Adult Health

## 2008-11-03 ENCOUNTER — Ambulatory Visit: Payer: Self-pay | Admitting: Internal Medicine

## 2008-11-03 LAB — CONVERTED CEMR LAB
ALT: 37 units/L — ABNORMAL HIGH (ref 0–35)
AST: 29 units/L (ref 0–37)
Albumin: 4.3 g/dL (ref 3.5–5.2)
Alkaline Phosphatase: 115 units/L (ref 39–117)
BUN: 15 mg/dL (ref 6–23)
CO2: 23 meq/L (ref 19–32)
Calcium: 9.3 mg/dL (ref 8.4–10.5)
Chloride: 102 meq/L (ref 96–112)
Cholesterol: 214 mg/dL — ABNORMAL HIGH (ref 0–200)
Creatinine, Ser: 0.68 mg/dL (ref 0.40–1.20)
Glucose, Bld: 116 mg/dL — ABNORMAL HIGH (ref 70–99)
HDL: 101 mg/dL (ref >39)
LDL Cholesterol: 84 mg/dL (ref 0–99)
Potassium: 4.1 meq/L (ref 3.5–5.3)
Sodium: 140 meq/L (ref 135–145)
Total Bilirubin: 0.7 mg/dL (ref 0.3–1.2)
Total CHOL/HDL Ratio: 2.1
Total Protein: 7 g/dL (ref 6.0–8.3)
Triglycerides: 146 mg/dL (ref <150)
VLDL: 29 mg/dL (ref 0–40)

## 2008-11-23 ENCOUNTER — Encounter (INDEPENDENT_AMBULATORY_CARE_PROVIDER_SITE_OTHER): Payer: Self-pay | Admitting: Adult Health

## 2008-11-23 ENCOUNTER — Ambulatory Visit: Payer: Self-pay | Admitting: Internal Medicine

## 2008-11-23 LAB — CONVERTED CEMR LAB: Microalb, Ur: 0.5 mg/dL (ref 0.00–1.89)

## 2009-09-20 ENCOUNTER — Ambulatory Visit: Payer: Self-pay | Admitting: Internal Medicine

## 2009-09-20 ENCOUNTER — Encounter (INDEPENDENT_AMBULATORY_CARE_PROVIDER_SITE_OTHER): Payer: Self-pay | Admitting: Adult Health

## 2009-09-20 LAB — CONVERTED CEMR LAB
ALT: 40 units/L — ABNORMAL HIGH (ref 0–35)
AST: 47 units/L — ABNORMAL HIGH (ref 0–37)
Albumin: 4 g/dL (ref 3.5–5.2)
Alkaline Phosphatase: 101 units/L (ref 39–117)
BUN: 12 mg/dL (ref 6–23)
CO2: 25 meq/L (ref 19–32)
Calcium: 9.1 mg/dL (ref 8.4–10.5)
Chloride: 104 meq/L (ref 96–112)
Creatinine, Ser: 0.57 mg/dL (ref 0.40–1.20)
Glucose, Bld: 87 mg/dL (ref 70–99)
INR: 1.09 (ref <1.50)
Microalb, Ur: 0.74 mg/dL (ref 0.00–1.89)
Phenytoin Lvl: 12.2 ug/mL (ref 10.0–20.0)
Potassium: 4.1 meq/L (ref 3.5–5.3)
Prothrombin Time: 14 s (ref 11.6–15.2)
Sodium: 142 meq/L (ref 135–145)
Total Bilirubin: 0.4 mg/dL (ref 0.3–1.2)
Total Protein: 6.8 g/dL (ref 6.0–8.3)
Vit D, 25-Hydroxy: 16 ng/mL — ABNORMAL LOW (ref 30–89)

## 2009-10-10 ENCOUNTER — Encounter: Admission: RE | Admit: 2009-10-10 | Discharge: 2009-10-10 | Payer: Self-pay | Admitting: Internal Medicine

## 2009-10-16 ENCOUNTER — Other Ambulatory Visit: Admission: RE | Admit: 2009-10-16 | Discharge: 2009-10-16 | Payer: Self-pay | Admitting: Adult Health

## 2009-10-16 ENCOUNTER — Ambulatory Visit: Payer: Self-pay | Admitting: Internal Medicine

## 2009-10-16 LAB — CONVERTED CEMR LAB: Pap Smear: NEGATIVE

## 2009-12-20 ENCOUNTER — Ambulatory Visit: Payer: Self-pay | Admitting: Internal Medicine

## 2009-12-20 ENCOUNTER — Encounter (INDEPENDENT_AMBULATORY_CARE_PROVIDER_SITE_OTHER): Payer: Self-pay | Admitting: Family Medicine

## 2009-12-20 LAB — CONVERTED CEMR LAB: HCV Quantitative: 27000000 intl units/mL — ABNORMAL HIGH (ref <43)

## 2010-01-11 ENCOUNTER — Ambulatory Visit (HOSPITAL_COMMUNITY): Admission: RE | Admit: 2010-01-11 | Discharge: 2010-01-11 | Payer: Self-pay | Admitting: Gastroenterology

## 2010-05-10 ENCOUNTER — Ambulatory Visit: Admit: 2010-05-10 | Payer: Self-pay | Admitting: Gastroenterology

## 2010-05-20 ENCOUNTER — Encounter: Payer: Self-pay | Admitting: Neurosurgery

## 2010-05-21 ENCOUNTER — Encounter: Payer: Self-pay | Admitting: Internal Medicine

## 2010-07-06 IMAGING — MG MM DIGITAL DIAGNOSTIC BILAT
6 series · 6 of 6 positions shown · non-contrast
Comparison: None.

CLINICAL DATA: Patient presents for a bilateral diagnostic
mammogram due to a palpable abnormality in the upper outer left
breast for 2 weeks.

DIGITAL DIAGNOSTIC BILATERAL MAMMOGRAM WITH CAD AND LEFT BREAST
ULTRASOUND:

[R CC]
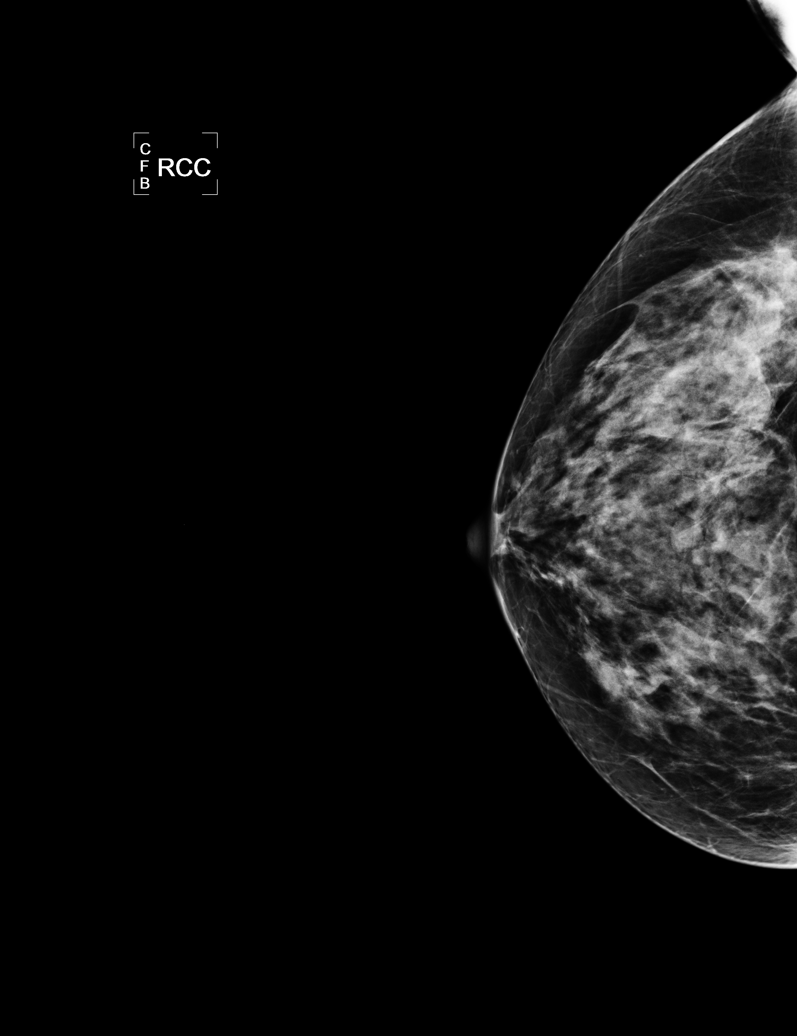

[L CC]
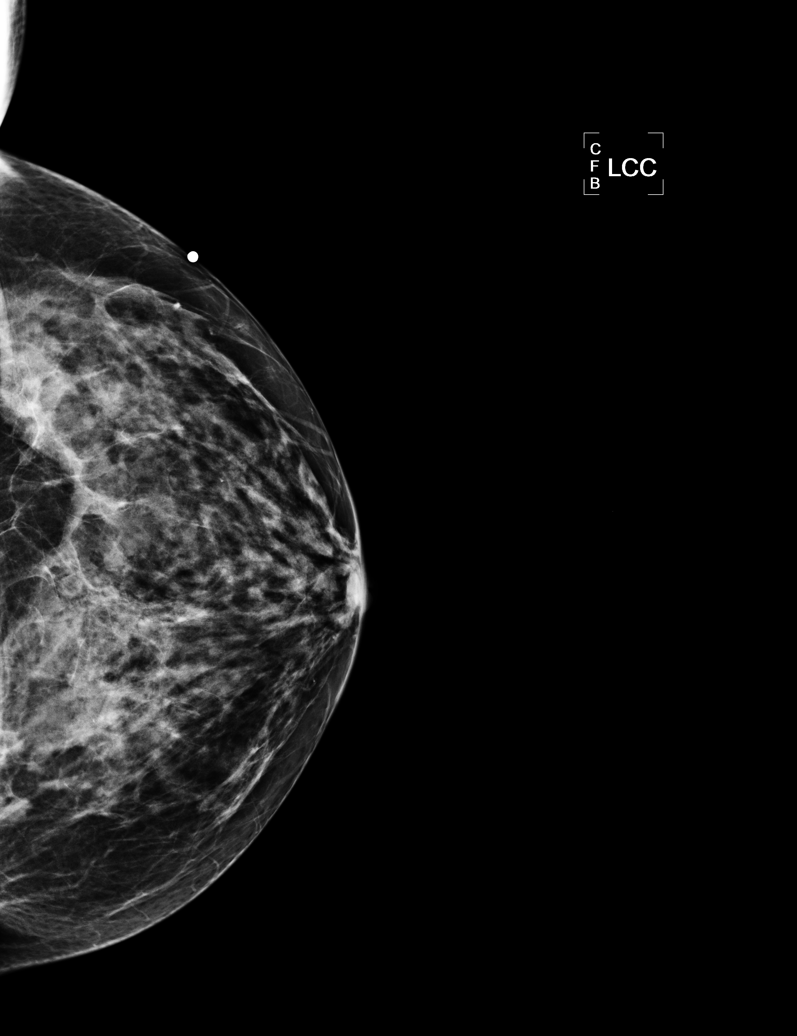

[L MLO (1 of 2)]
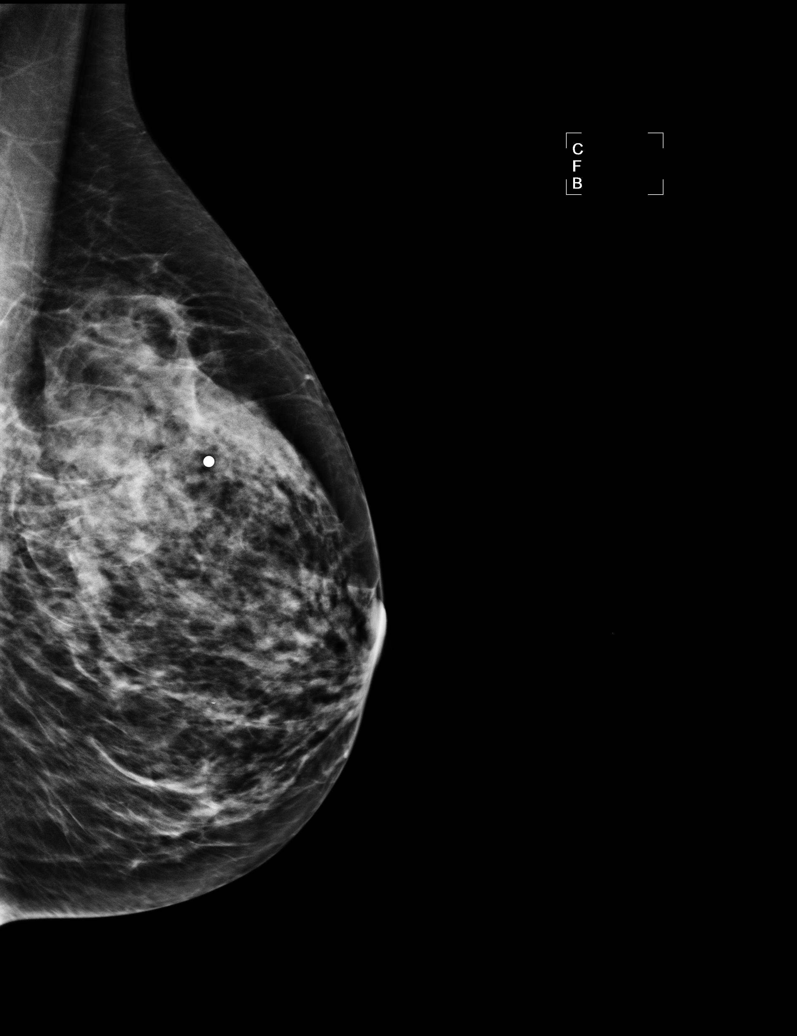

[R MLO]
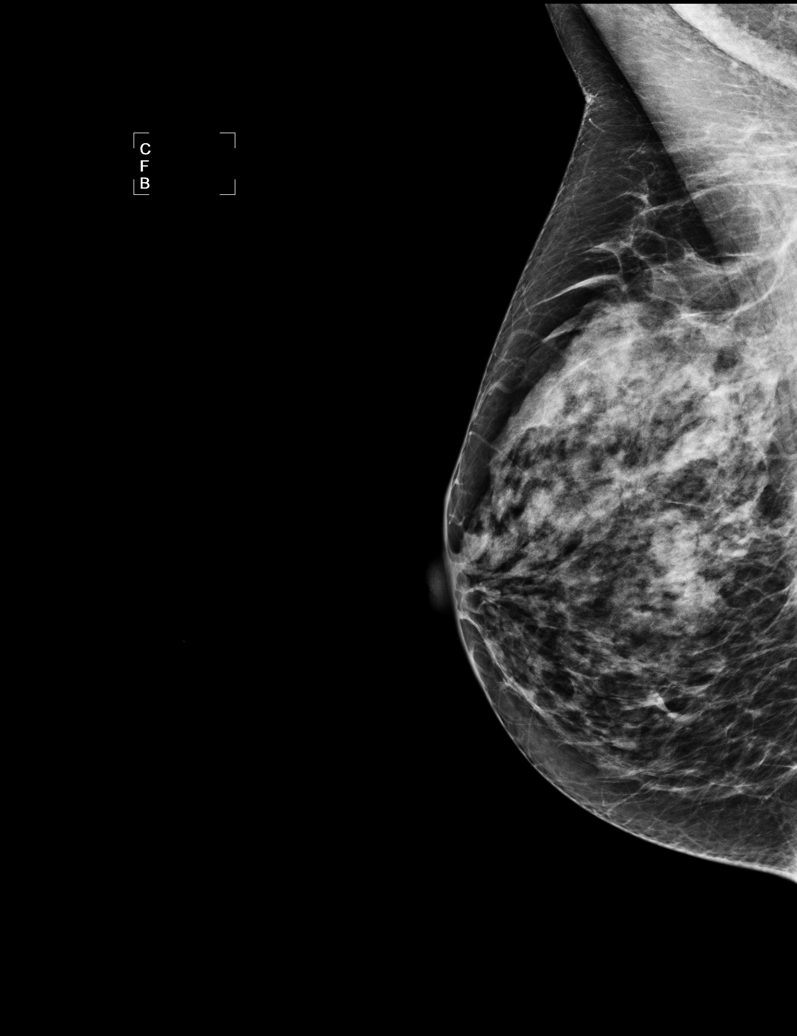

[L TAN]
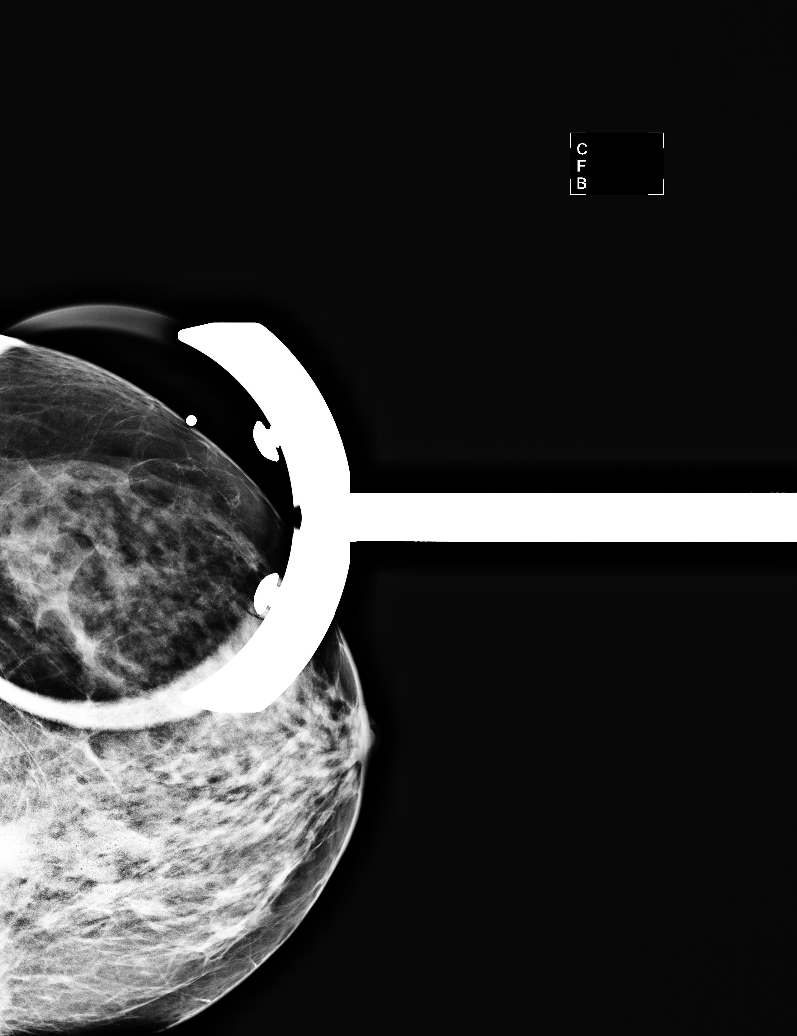

[L MLO (2 of 2)]
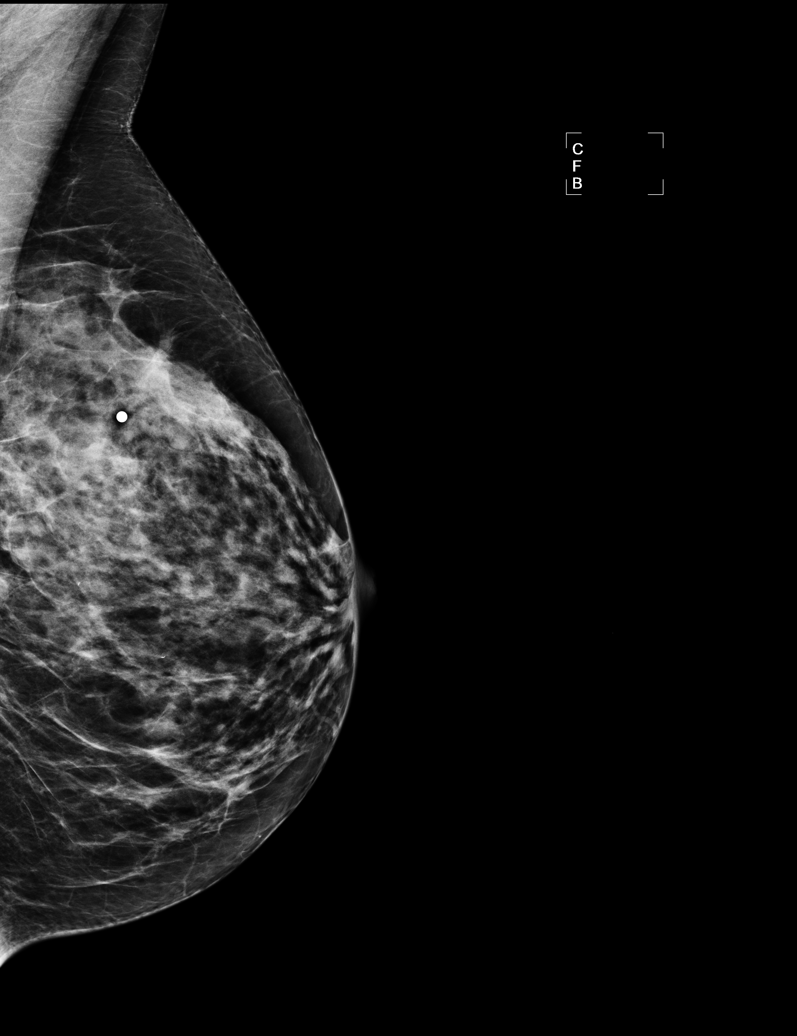

[6 of 6 positions shown; findings below may reference images not displayed]

FINDINGS: Exam demonstrates heterogeneously dense fibroglandular
tissue.  There is no focal abnormality over the upper outer left
breast to correspond to patient's palpable abnormality.  Remainder
of the exam is unremarkable.
Mammographic images were processed with CAD.

Ultrasound is performed, showing no focal abnormality in the upper
outer left breast correspond to patient's palpable abnormality.
IMPRESSION: No focal abnormality in the upper outer left breast to correspond
to patient's palpable abnormality.

Recommendations:  Recommend continued annual bilateral screening
mammographic followup.  Also recommend continued management of
patient's palpable abnormality on a clinical basis.

BI-RADS CATEGORY 1:  Negative.

## 2010-09-14 NOTE — Consult Note (Signed)
Wanda Prince, HOWSER                 ACCOUNT NO.:  [id number]   MEDICAL RECORD NO.:  [id number]          PATIENT TYPE:  INP   LOCATION:  3109                         FACILITY:  [facility]   PHYSICIAN:  Oley Balm. Sung Amabile, M.D. Romualdo Bolk OF BIRTH:  27-Feb-1956   DATE OF CONSULTATION:  07/28/2005  DATE OF DISCHARGE:                                   CONSULTATION   REQUESTING PHYSICIAN:  Dr. Trey Sailors   REASONS FOR CONSULTATION:  1.  Vent-dependent respiratory failure.  2.  Right middle lobe cavitation.   HISTORY OF PRESENT ILLNESS:  Ms. Pfefferle is 55 year old woman who presented to  the [facility] Emergency Department by way of emergency medical services on  the afternoon of this consultation.  The night previously she was found to  exhibit unusual or bizarre behavior by her family members.  She is on anti-  epileptic medications and they attributed her behavior to this.  She went to  bed and did not awaken the following day.  The family members felt that she  was just sleeping heavily.  Therefore, there were no attempts to awaken her  until the afternoon at which time they found her to be unarousable and  contacted emergency medical services.  Upon their arrival she underwent  intubation for decreased level of consciousness and was transported to the  [facility] Emergency Department.  Her initial CT scan of the head  demonstrated hydrocephalous.  Dr. Channing Mutters was contacted.  He has subsequently  placed a ventriculostomy drain without substantial improvement in her  neurologic status.  Of note, on chest x-ray she is found to have a right  middle lobe cavitary lesion.  This was actually seen two days prior to this  evaluation on a previous visit to the emergency department.  With regard to  this problem, she has had symptoms of bronchitis for the past month and  has been on several rounds of antibiotics including some unspecified  antibiotic prescribed on July 26, 2005.  She has had cough with  hemoptysis.  Family does not know if she has had recent fevers or pleuritic chest pain.   PAST MEDICAL HISTORY:  1.  A touch of emphysema..  2.  Chronic bronchitis.  3.  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  She is left with a seizure disorder for which she      takes Dilantin.   CURRENT MEDICATIONS:  She takes an inhaled steroid, Combivent, Dilantin, and  has recently been prescribed an antibiotic.   SOCIAL HISTORY:  Continues to smoke.  She does work as a Advertising copywriter.  Apparently uses illicit drugs as evidenced by her urine drug screen.   FAMILY HISTORY:  Noncontributory.   REVIEW OF SYSTEMS:  Otherwise unavailable.   PHYSICAL EXAMINATION:  VITAL SIGNS:  She is a 55 year old woman who appears  substantially older than her stated age, intubated, and minimally  responsive.  She has sequelae of previous right temporal craniotomy.  HEENT:  Otherwise notable for poor dentition.  NECK:  Supple without  adenopathy or jugular venous distension.  CHEST:  Few rhonchi without wheezes or other adventitious sounds.  CARDIAC:  Regular rate and rhythm with no murmurs.  ABDOMEN:  Soft, nontender with normal bowel sounds.  EXTREMITIES:  Without clubbing, cyanosis, and edema.  NEUROLOGIC:  Reveals her to be deeply obtunded, responding only to very deep  painful stimulation.  Her gaze is disconjugate.  Gag reflex is absent.  Corneals appear to be absent.  Strength cannot be tested.  Deep tendon  reflexes appear diminished and symmetric.   DATA:  Chest x-ray reveals mild hyperinflation consistent with emphysema and  a right-sided cavitary lesion in the right mid lung zone surrounded by  inflammatory changes.  CT scan of the chest reveals a right middle lobe  cavitary lesion, again appearing to be surrounded by inflammatory changes.  Lower down in the right middle lobe there is a small lower area of  cavitation as well.   IMPRESSION AND PLAN:   1.  Vent-dependent respiratory failure - The major reason for intubation was      altered mental status related to the neurologic process.  According to      Dr. Channing Mutters we will keep her intubated for approximately 48 hours or so and      then begin to make daily assessment of readiness for extubation.  I      suspect that she will be easily extubated once her cognitive function      permits.  In the meantime, sedation has been ordered and I have ordered      stress ulcer and deep venous thrombosis prophylaxis.  2.  Right middle lobe cavitary lesion - Based on the history and      radiographic findings I suspect that the most likely a lung abscess that      does not represent malignancy, though certainly either would be a      possibility.  Given that it is a cavitating area, anaerobic coverage is      necessary.  I have written for Unasyn which will also cover for the      ventriculostomy drain.  This has been discussed with Dr. Channing Mutters.  Her      present endotracheal tube is a 7 which is too small to safely pass a      bronchoscope through.  Therefore, we will need to wait for extubation      prior to bronchoscopy or if bronchoscopy is needed to assist with      prognostication while she is intubated the endotracheal tube will need      to be changed down to a larger size.  This can be discussed further as      her hospital course progresses.   I have discussed all of my impression and plan with Dr. Channing Mutters and with family  members at the bedside.           ______________________________  Oley Balm Sung Amabile, M.D. [facility]     DBS/MEDQ  D:  07/28/2005  T:  07/29/2005  Job:  [id number]

## 2010-09-14 NOTE — Op Note (Signed)
Wanda Prince, Wanda Prince                 ACCOUNT NO.:  [id number]   MEDICAL RECORD NO.:  [id number]          PATIENT TYPE:  INP   LOCATION:  3109                         FACILITY:  [facility]   PHYSICIAN:  Gabrielle Dare. Janee Morn, M.D.DATE OF BIRTH:  1955-08-05   DATE OF PROCEDURE:  09/05/2005  DATE OF DISCHARGE:                                 OPERATIVE REPORT   PREOPERATIVE DIAGNOSES:  1.  Ventilator dependent respiratory failure.  2.  Need for enteral access.   POSTOPERATIVE DIAGNOSES:  1.  Ventilator dependent respiratory failure.  2.  Need for enteral access.   PROCEDURES:  1.  Esophagogastroduodenoscopy with percutaneous endoscopic gastrostomy tube      placement.  2.  Tracheostomy with #6 Shiley.   SURGEON:  Gabrielle Dare. Janee Morn, M.D.   ASSISTANT:  Shawn Rayburn, PAC on the PEG only.   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  We  are proceeding with bedside PEG placement and tracheostomy.   PROCEDURE IN DETAIL:  Informed consent was obtained from the patient's  family.  She has remained monitored in the intensive care unit.  Fentanyl  and Versed infusions are going.  We also used boluses.  In addition, she was  given Norcuron.  Attention was first directed to the PEG tube placement. A  bite block was inserted into her mouth.  Esophagogastroduodenoscope was  inserted down into the esophagus.  No gross abnormalities were noted.  We  entered the stomach and then inspected the pylorus.  There were no ulcers or  other abnormalities.  The scope was advanced to the second portion of  duodenum.  There was no evidence of gastric outlet obstruction, ulcers or  other abnormalities.  The scope was withdrawn back into the stomach.  The  stomach was then inspected and grossly normal.  The stomach was insufflated  and excellent  poke was obtained from the anterior abdominal wall as well as  transillumination.  A small incision was made and Angiocath was inserted  under direct vision into the stomach.  Guidewire was passed and captured  with the endoscopic snare.  Guidewire was brought out through the mouth and  the PEG was attached to the guidewire and the PEG was brought back out and  pulled through the abdominal wall.  Under direct vision, we did follow the  PEG back down the esophagus with the scope and the PEG flange was applied so  the peg just turn and it was left snug.  The PEG was put to gravity drainage  and sterile dressing was applied.  The PEG was also taped to the patient's  abdominal wall to protect it from being pulled out and we will place  abdominal binder.  The patient tolerated this well.  Attention was directed  to the tracheostomy.  A roll was placed behind her shoulders.  Neck was  prepped and draped in sterile fashion.  The 1.5% lidocaine with epinephrine  from  the [facility] Rhino kit was infiltrated in an area of 1.5 cm cephalad to the  sternal notch.  Transverse incision was made.  Subcutaneous tissues were  dissected down.  The strap muscles were divided and excellent hemostasis was  obtained with the Bovie cautery.  This revealed the trachea.  The trachea  was cleaned off.  The space between the second and third tracheal ring was  easily visualized.  The Angiocath was inserted and the guidewire placed from  the [employer]. We then used the small blue dilator followed by the Blue  Rhino dilator and then subsequently the #6 Shiley trache was placed over the  24 dilator from the [employer].  The inner cannula was placed in the  trache and this was hooked up to the ventilator circuit and excellent return  of volumes was obtained.  Sterile dressing was placed around the trache.  It  was secured to the skin with four 3-0 nylon sutures, one in each corner.  After the balloon was insufflated,  she continued to get excellent volumes,  sats remained at 100% throughout.  Sponge, needle and instrument counts were  correct.  This completed our procedure.  The patient tolerated the  procedures well and remained monitored in the neurology intensive care unit.  We will check a chest x-ray.      Gabrielle Dare Janee Morn, M.D.  Electronically Signed     BET/MEDQ  D:  09/05/2005  T:  09/06/2005  Job:  [id number]   cc:   Shan Levans, M.D. [facility]  [address]  [place]  [place] [address]

## 2010-09-14 NOTE — H&P (Signed)
NAMEANALYCE, TAVARES NO.:  [id number]   MEDICAL RECORD NO.:  [id number]          PATIENT TYPE:  INP   LOCATION:  3109                         FACILITY:  [facility]   PHYSICIAN:  Payton Doughty, M.D.      DATE OF BIRTH:  [birth year]   DATE OF ADMISSION:  07/28/2005  DATE OF DISCHARGE:                                HISTORY & PHYSICAL   HISTORY OF PRESENT ILLNESS:  I was in the emergency room seeing another  patient at approximately 6 p.m. when I was notified that there was a patient  in the emergency room that had been there since 3 p.m. with a Glasgow Coma  Scale of 4.  She had been intubated.  When I went into the patient's room,  she was sweating profusely.  She had  blood pressure of approximately  212/120 and pulse of 140. There were no nurses in the room.  The family was  in the room.  Upon initial examination, she had pinpoint pupils with  bilaterally deviated gaze.  She had no corneal reflex and she had minimal  response to extremity save for some extensor posturing.  A look at the CT  scan showed massive hydrocephalus with some blood in her ventricles and I  placed an emergent right frontal ventriculostomy.  After discussion with the  family, I find out that her medical history.  She had an aneurysm attended  to approximately 20 years ago although there is no clip on her CT.  She has  seizure disorder. She is on Dilantin. Her Dilantin level is therapeutic at  13.  She also has COPD.  According to the family, she was acting a bit  strangely yesterday. Apparently went to bed at some time around 8 p.m. the  evening before and at about 8 a.m. this morning, they were unable to wake  her and they called the emergency vehicles at approximately 1:30 p.m.   SOCIAL HISTORY:  She does have a positive urine test for cocaine.  Her  mother says she does use cocaine although she did not think in the last  couple of days.  She also smokes and drinks.   PAST SURGICAL HISTORY:   Surgical history includes an aneurysm and operation  on the lumbar spine.   PHYSICAL EXAMINATION:  HEENT: Shows a cranial scar on the right.  Her fundi  show bunch vessels.  There is positive papilledema.  NECK:  Without deformity.  CHEST:  Decreased breath sounds on the right with bilateral rhonchi.  CARDIAC:  Tachycardic with a regular rate and rhythm.  I did not detect a  murmur.  ABDOMEN:  Soft with positive bowel sounds.  EXTREMITIES:  She does have clubbing.  NEUROLOGIC:  She does not respond to voice, minimally to pinch with extensor  posturing.  Her pupils are pinpoint. She has bilateral lateral deviated gaze  and no doll's.  She has no corneal response.  She did have a cough response.  She did have a gag response and as noted above, she has extensor posturing.   ASSESSMENT:  At this time this patient is in extremis with hydrocephalus and  bleed with a questionable etiology.  All the blood in her head is subacute.  With the history of the cocaine and the aneurysm 20 years ago, I suspect  that she has borderline hydrocephalus and has some cocaine-related bleeding.  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  She also has a mass  in her right chest on her chest x-ray. She was sent out of this emergency  room two days ago, being treated for bronchitis.  I am going to check a  contrast CT scan.  Admit her with the ventriculostomy and ask for a medicine  consult.  At this time of the procedure in the [facility]  emergency room, there was inadequate shaving equipment for removing hair  from the head.  The shaving equipment provided in the emergency room took  five different heads to get it to cut enough hair and added approximately 10  minutes to the procedure time.           ______________________________  Payton Doughty, M.D.     MWR/MEDQ  D:  07/28/2005  T:  07/30/2005  Job:  [id number]

## 2010-09-14 NOTE — Discharge Summary (Signed)
Wanda Prince, Wanda Prince NO.:  [id number]   MEDICAL RECORD NO.:  [id number]          PATIENT TYPE:  INP   LOCATION:  3034                         FACILITY:  [facility]   PHYSICIAN:  Payton Doughty, M.D.      DATE OF BIRTH:  04-Jan-1956   DATE OF ADMISSION:  07/28/2005  DATE OF DISCHARGE:  12/03/2005                                 DISCHARGE SUMMARY   ADMITTING DIAGNOSIS:  Hydrocephalus, subendymal abscess.   DISCHARGE STATUS:  Alive and well.   BODY OF TEXT:  This is a 55 year old right-handed white girl with a history  whose history and physicals are on the chart.  She had several days of  lethargy and presented to [facility] Emergency Room with respiratory arrest,  dilated pupils, and CT scan demonstrating hydrocephalus and a mass in the  left subependymal region.  She was intubated in the emergency room and had  ventriculostomy placed in the emergency room and was placed to ventricular  drainage.  She was also started on antibiotics per the infectious disease  service.  She remained intubated and after about 10 days had a tracheostomy  placed.  Ventriculostomy continued to drain purulent fluid with high  protein, although her fever defervesced.  Over the course of the next four  weeks, ventriculostomy was changed once for the patient since the patient  pulled it out, but then she was able to be weaned off the ventilator and had  a percutaneous endoscopic gastrostomy placed and started tube feeds.  Gradually, her temperature defervesced.  The protein content in the spinal  fluid diminished and she underwent placement of a right frontal  ventriculoperitoneal shunt.  Once the shunt was placed, she was placed back  in the unit, then was off the ventilator, was graduated to the ACU.  She was  sent out to the floor.  After a day or so on the floor, she had an  aspiration pneumonia and had tube feeding coming out of her trach.  She was  transferred back to the unit after placed in  the head-up posture and tube  feed was stopped.  Over the next several days, she was treated empirically  with IV antibiotics.  Fever deferevesced.  Her lungs cleared.  Tube feed was  started back more slowly and she was able to tolerate the tube feeding once  again.  She was placed on a Passy-Muir valve and began talking.  Janina Mayo was  down sized and it was eventually able to be removed about two weeks ago.  She passed a swallowing study.  Her diet has been steadily advanced to now  where she is on a regular diet with thin liquids.  Because she is able to  meet her caloric needs by eating, her PEG has been removed.  Foley is to be  removed today.  She is undergoing physical therapy to restore ambulatory and  mobility function.  Currently, she is awake, follows commands.  Her pupils  were equal, round and reactive to light.  Her extraocular movements are  intact.  Orlene Erm is largely appropriate.  She is generally debilitated  from being in the ICU and in the hospital for so long, but basically remains  neurologically intact.  The shunt continues to function well without  evidence of infection or hydrocephalus.  She is being discharged home to a  nursing facility today.  This nursing facility with continue her  rehabilitation.   CURRENT MEDICATIONS:  1.  Pulmicort 0.25 mg t.i.d.  2.  Xopenex 0.63 mg t.i.d.  3.  Thiamine daily.  4.  Reglan 5 mg p.o. q.6h.  5.  Protonix 40 mg p.o. daily.  6.  Seroquel 25 mg at night.  7.  Dilantin 200 mg during the daytime and 300 mg at night.  8.  Tylenol on a p.r.n. basis.           ______________________________  Payton Doughty, M.D.     MWR/MEDQ  D:  12/03/2005  T:  12/03/2005  Job:  [id number]

## 2010-09-14 NOTE — Op Note (Signed)
NAMEED, RAYSON                 ACCOUNT NO.:  [id number]   MEDICAL RECORD NO.:  [id number]          PATIENT TYPE:  INP   LOCATION:  3109                         FACILITY:  [facility]   PHYSICIAN:  Coletta Memos, M.D.     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   PROCEDURE:  Placement of left frontal ventricular catheter under local  anesthetic.   COMPLICATIONS:  None.   SURGEON:  Coletta Memos, M.D.   INDICATIONS:  Sylwia Cuervo is a 55 year old who was admitted on July 28, 2005, comatose secondary to increased ventricular pressure and bacterial  meningitis.  She has had ventricular drainage from the right side since that  time.  She became more lethargic over the last 48 hours or so.  Head CT  showed increasing size of the left lateral ventricle.  I therefore  recommended to the family that she undergo placed on the left lateral  ventricular catheter.   PROCEDURE:  Ms. Gershman head was shaved, she was prepped in a sterile  fashion.  I infiltrated 1% lidocaine into the left frontal region and in the  tunneling tract.  I then made a skin incision in the left frontal region.  I  created a bur hole with a twist drill.  I then placed the ventricular  catheter without difficulty to approximately 7 cm to the outer table.  Pulsatile fluid drained easily.  It was then tunneled approximately 3.5 to 4  cm from the bur hole site.  It was secured to the exit site.  The bur hole  site was closed with a single nylon suture.  The catheter was secured also  with a single nylon suture.  Fluid was obtained after securing the catheter  to the skin.  Mrs. Tapper  tolerated the procedure well.  The catheter was then  connected to a sterile drainage system.           ______________________________  Coletta Memos, M.D.     KC/MEDQ  D:  09/01/2005  T:  09/02/2005  Job:  [id number]

## 2010-09-14 NOTE — H&P (Signed)
Wanda Prince, Wanda Prince                 ACCOUNT NO.:  [id number]   MEDICAL RECORD NO.:  [id number]          PATIENT TYPE:  EMS   LOCATION:  MAJO                         FACILITY:  [facility]   PHYSICIAN:  Hettie Holstein, D.O.    DATE OF BIRTH:  07-Apr-1956   DATE OF ADMISSION:  05/07/2005  DATE OF DISCHARGE:                                HISTORY & PHYSICAL   HISTORY OF PRESENT ILLNESS:  History is obtained by discussion with Dr.  Read Drivers who was able to interview a present family member during the seizure  episode as this patient's present family member, her husband, is not  available to me at this time.  I have left a number for him to contact us so  that we can discuss  the surrounding issues.  However, according to Dr.  Read Drivers and according to the patient's mother in [place] whom I contacted via  phone, Ms. Mullins had been doing fairly well recently.  However, she and her  husband were over at a neighbor's house doing some yard work today.  Her  husband came back to her house to get her pocket book and get her  medications as he stated she was having a seizure.  In any event, EMS  brought her to the emergency department.  She was assessed by Dr. Read Drivers and  described the history as having a seizure.  They had tried Dilantin as well  as I believe Ativan at home.  In the emergency department, she does appear  to be postictal.  A CT of her head was performed without evidence of acute  finding, though recommendations for followup.   PAST MEDICAL HISTORY:  Ms. Dick has prior history significant for previous  suicide attempt. The family states they do not feel she took any medications  as an overdose today.  I did ask her this question as well, and she did nod  her head no. She does have a history of seizure disorder with arteriovenous  malformations with bleeding in the past.  She has had a craniotomy due to  this. She denied hypertension.  She does retain her gallbladder and  appendix.  She suffers  from tobacco dependence, and her urine drug screen  this admission was positive for cocaine and benzodiazepines.   MEDICATIONS:  Her mother states she takes Dilantin 2 tablets twice daily,  dosage not known.  Her level in the emergency department was 10.8.  She had  been on phenobarbital in the past; however, she no longer takes this.   ALLERGIES:  No known drug allergies.   SOCIAL HISTORY:  She is married and has a 41 year old child.  She had been  separated from her husband for quite some time.  They had been having  difficulties.  I believe, according to the patient's mom, this has improved  lately.   FAMILY HISTORY:  Noncontributory.   REVIEW OF SYSTEMS:  Unobtainable.   PHYSICAL EXAMINATION:  VITAL SIGNS: Blood pressure 100/60, respirations 20,  heart rate 72, O2 saturation 96%.  GENERAL:  The patient is arousable.  She does  appear postictal.  She is  breathing comfortably and moving her extremities spontaneously.  HEENT:  Reveals her head to be normocephalic and atraumatic.  Extraocular  muscles could not be examined.  The patient was not following commands.  Pupils were reactive.  NECK:  Supple.  CARDIOVASCULAR: Normal S1 and S2.  LUNGS: Reveal rhonchi and a predominantly __________ ,  ABDOMEN:  Nontender.  EXTREMITIES:  No edema.   LABORATORY DATA:  Sodium 134, potassium 3.8, creatinine 1, glucose 113.  Dilantin 10.8.  Urine drug screen was positive for benzodiazepines and  cocaine.   CT of the head revealed encephalomalacia in the right frontal lobe.  There  is some dystrophic calcification.  Recommended followup CT scan which will  obtain in the morning.   ASSESSMENT:  1.  Seizure disorder and altered mental status.  2.  History of arteriovenous malformation status post craniotomy.  3.  Polysubstance abuse.  4.  Incomplete history, await more to be provided when the husband can      contact us.  I provided him with a contact number.   PLAN:  At this time we are  going to admit Ms. Hamil to a step-down ICU,  follow her respiratory status closely, and administer IV benzodiazepines for  recurrent seizures.  We will continue Dilantin via IV, hold her n.p.o. until  her mental status clears, and repeat CT scan in the morning.      Hettie Holstein, D.O.  Electronically Signed     ESS/MEDQ  D:  05/07/2005  T:  05/07/2005  Job:  [id number]   cc:   Dala Dock

## 2010-09-14 NOTE — Discharge Summary (Signed)
Wanda Prince, Wanda Prince                 ACCOUNT NO.:  [id number]   MEDICAL RECORD NO.:  [id number]          PATIENT TYPE:  INP   LOCATION:  6733                         FACILITY:  [facility]   PHYSICIAN:  Jonna L. Robb Matar, M.D.DATE OF BIRTH:  May 12, 1955   DATE OF ADMISSION:  12/17/2004  DATE OF DISCHARGE:  12/20/2004                                 DISCHARGE SUMMARY   PRIMARY CARE PHYSICIAN:  Dr. Abundio Miu at [facility].   FINAL DIAGNOSES:  1.  Dilantin overdose.  2.  Suicide attempt.  3.  Alcohol abuse.  4.  Depression.  5.  Chronic obstructive pulmonary disease.  6.  History of seizures.  7.  Alcoholic hepatitis.  8.  Iron deficiency anemia.  9.  Urinary tract infection.   CONSULTATIONS:  Dr. Antonietta Breach, psychiatry.   OPERATION:  None.   ALLERGIES:  None.   CODE STATUS:  Full.   HISTORY:  This 55 year old white female had been doing well until about six  months ago when her husband left, and since then she has been smoking a lot,  increased her drinking from one or two beers a night to quite a bit, and  finally the night of admission drank a lot of beer and then took about 4 g  of Dilantin.  In the emergency room, her level was 22.  She was given  charcoal.   Her past medical history includes AV malformation with intracranial bleeding  in the past, and a subsequent seizure disorder.   Her medications had been Dilantin and Combivent.  She works as a Agricultural engineer.   Physical exam was notable for poor dentition, dysthymic and irritable.   HOSPITAL COURSE:  The patient was monitored on telemetry.  Her Dilantin was  held and levels gradually improved.  She was put on IV fluids.  She was seen  in consultation by Dr. Jeanie Sewer and started on Celexa 20 mg.  Thyroid  levels, B12, folate, urine were normal.  She was put on Rocephin for low  grade urinary tract infection.  She was found to have low iron.   DISPOSITION:  The patient will be discharged on Dilantin 200 b.i.d.,  Citalopram 20 mg daily, Combivent two puffs 4-6 times daily as needed.  She  is to take vitamins with iron.  She is to follow up with Dr. Estelle June at  [facility] in 3-4 weeks, and also got information to follow up at [facility].      Jonna L. Robb Matar, M.D.  Electronically Signed     JLB/MEDQ  D:  12/20/2004  T:  12/21/2004  Job:  [id number]

## 2010-09-14 NOTE — H&P (Signed)
Wanda Prince, [name] NO.:  [id number]   MEDICAL RECORD NO.:  [id number]          PATIENT TYPE:  EMS   LOCATION:  MAJO                         FACILITY:  [facility]   PHYSICIAN:  Hettie Holstein, D.O.    DATE OF BIRTH:  11-27-1955   DATE OF ADMISSION:  12/17/2004  DATE OF DISCHARGE:                                HISTORY & PHYSICAL   PRIMARY CARE PHYSICIAN:  [facility].   CHIEF COMPLAINT:  Drug overdose.   HISTORY OF PRESENT ILLNESS:  This is a 55 year old Caucasian female with a  previous history of intracranial bleed due to an aneurysm in the distant  past who suffers from a seizure disorder, approximately 1 every 6 months.  She does have a history of drug overdose in the past per review of E chart  records in April of 2005, and a suicide attempt and psychiatric evaluation  by Dr. Jeanie Sewer previously.  In any event, she states she took about forty  100 mg tablets of Dilantin several hours previously due to problems with her  husband for the past 10 months; they have been separated.  She drank some  beer.  She stated at times she probably want to kill herself, though she  does not feel this way at this point.  She, in addition, states that drank a  few cans of beer.  Her Dilantin level while in the emergency department was  22.  She was given charcoal by the emergency department physician at that  time.  She is being directed for admission and supportive management and  observation.   PAST MEDICAL HISTORY:  As described above.  1.  Previous suicide attempt.  2.  History of seizure disorder.  3.  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  4.  She has had a craniotomy due to this previously.  5.  She denies hypertension.  6.  She is currently perimenopausal.  7.  She retains her gallbladder and appendix.  8.  She suffers from tobacco dependence.   ALLERGIES:  She has no known drug allergies.   MEDICATIONS:  Dilantin 100 mg q.12h. (?).  (She  had been on phenobarbital in the past; however, no longer takes this.).   SOCIAL HISTORY:  She is married and has a 67 year old child.  She is  separated from her husband for the past 10 months, which is a source of much  of her depression.  She works as a Agricultural engineer.   FAMILY HISTORY:  Noncontributory.   REVIEW OF SYSTEMS:  She states that she has had a poor appetite, and  subjective weight loss.  Otherwise, no other complaints, with the exception  of perimenopausal symptoms.   PHYSICAL EXAMINATION:  VITAL SIGNS:  Her bowel sounds were stable in the  emergency department.  GENERAL:  She is alert, oriented, and cooperative.  In no acute distress.  HEENT:  Extraocular muscles were intact.  Oropharynx was clear.  She did  have poor dentition.  NECK:  Supple, nontender.  No palpable thyromegaly or mass.  CARDIOVASCULAR:  Normal S1 and S2.  LUNGS:  Clear, though diminished bilaterally.  ABDOMEN:  Soft, nontender.  EXTREMITIES:  No edema.  Peripheral pulses were palpable.  NEUROLOGIC:  Her affect was a bit labile.  She was dysthymic and a bit  irritable.   LABORATORY DATA:  Urinalysis was negative.  Alcohol level was 139.  Dilantin  level was 22.4.  Salicylates were negative.  Tylenol was negative.  WBC 7.7,  hemoglobin 14.2, platelet count 214, MCV of 98.  INR 1.2.  Sodium 141,  potassium 3.3, BUN 15, creatinine 0.7, glucose 94.   ASSESSMENT:  1.  Dilantin overdose, intentional.  2.  Depression.  3.  History of seizure disorder.  4.  Previous history of suicide attempt in the past.  5.  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  6.  Hypokalemia.   PLAN:  At this time, we will admit Wanda Prince to the telemetry floor, follow  her course clinically and supportively.  Will ask psychiatry to evaluate  her.  Will call Dr. Jeanie Sewer to see her, and establish further disposition  once this evaluation has occurred.      Hettie Holstein, D.O.  Electronically Signed      ESS/MEDQ  D:  12/18/2004  T:  12/18/2004  Job:  [id number]

## 2010-09-14 NOTE — Op Note (Signed)
[name], [name]                 ACCOUNT NO.:  [id number]   MEDICAL RECORD NO.:  [id number]          PATIENT TYPE:  INP   LOCATION:  3109                         FACILITY:  [facility]   PHYSICIAN:  Danae Orleans. Venetia Maxon, M.D.  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   PROCEDURE:  Left frontal interventricular catheter placement.   SURGEON:  Danae Orleans. Venetia Maxon, M.D.   ANESTHESIA:  Local lidocaine with intravenous Versed and morphine.   COMPLICATIONS:  None.   DISPOSITION:  Neuro ICU.   FINDINGS:  Purulent material with CSF was drained.   INDICATIONS:  Wanda Prince is a 55 year old woman with previous subependymal  abscess with a loculated ventricular system and obstructive hydrocephalus.  It was elected to place a ventricular drain on the left.  The previous drain  had pulled back and was no longer draining.   PROCEDURE:  Wanda Prince was sedated with morphine and Versed.  Her left frontal  scalp was then prepped and draped in the usual sterile fashion.  The  previous tube was withdrawn and after anchoring sutures were removed, the  previous incision was reopened.  Ventricular drain was inserted to a depth  of 6 cm with return of CSF initially which was blood tinged, then clear,  then purulent material.  The tube was then passed through a separate  subgaleal tunnel and anchored to the scalp and the frontal incision was  closed.  A sterile occlusive dressing was placed and the drainage system was  hooked up.  The patient appeared to tolerate the procedure well without  difficulty.      Danae Orleans. Venetia Maxon, M.D.  Electronically Signed     JDS/MEDQ  D:  09/23/2005  T:  09/23/2005  Job:  [id number]

## 2010-09-14 NOTE — Op Note (Signed)
[facility]  Patient:    Wanda Prince, Wanda Prince                        MRN: [id number] Proc. Date: 01/10/00 Adm. Date:  [id number] Attending:  Abigail Miyamoto A                           Operative Report  PREOPERATIVE DIAGNOSES:  Inguinal hernia.  POSTOPERATIVE DIAGNOSES:  Inguinal hernia.  PROCEDURE:  Right inguinal hernia repair with mesh.  SURGEON:  Dr. Riley Lam A. Magnus Ivan.  ANESTHESIA:  General endotracheal anesthesia and 0.25% Marcaine plain.  DESCRIPTION OF PROCEDURE:  The patient was brought to the operating room and identified as Wanda Prince. She was placed supine on the operating room table and general anesthesia was induced. The right groin was then prepped and draped in the usual sterile fashion. Using a #15 blade, a small longitudinal incision was made in the right groin. The incision was carried down through Scarpas fascia with electrocautery. The external oblique fascia was then opened with a scalpel and ______ with the Metzenbaum scissors. The patient was found to have an inguinal hernia defect in the floor of the inguinal canal with a small sac. At this point, the sac was pushed back into the abdominal cavity. The preperitoneal space was then dissected out with a Raytec. Next, a piece of Ethicon Prolene hernia mesh system was brought onto the field. The lower ring of mesh was then inserted into the preperitoneal space and unfolded. The outer mesh was then placed in the inguinal canal. It was then sewn in place circumferentially with interrupted 2-0 Vicryl sutures sewing into the pubic tubercle, shelving edge of the inguinal ligament, transversalis fascia and then out laterally. Excellent coverage of the floor appeared to be achieved. At this point, the external oblique fascia was closed with a running 2-0 Vicryl suture. Scarpas fascia was then closed with interrupted 3-0 Vicryl sutures. The wound was then anesthetized with 0.25% Marcaine. The  skin was then closed with a running 4-0 monocryl. Steri-Strips, gauze and Tegaderm were applied. The patient tolerated the procedure well. All sponge, needle and instrument counts were correct at the end of the procedure. The patient was then extubated in the operating room and taken in stable condition to the recovery room. DD:  01/10/00 TD:  01/11/00 Job: [id number]

## 2010-09-14 NOTE — Consult Note (Signed)
Wanda Prince, Wanda Prince                 ACCOUNT NO.:  [id number]   MEDICAL RECORD NO.:  [id number]          PATIENT TYPE:  INP   LOCATION:  3109                         FACILITY:  [facility]   PHYSICIAN:  Gabrielle Dare. Janee Morn, M.D.DATE OF BIRTH:  [birth year]   DATE OF CONSULTATION:  09/02/2005  DATE OF DISCHARGE:                                   CONSULTATION   REASON FOR CONSULTATION:  Evaluate for placement of tracheostomy tube and  percutaneous endoscopic gastrostomy tube.   HISTORY OF PRESENT ILLNESS:  The patient is a 55 year old female who was  admitted with mental status changes.  She was noted to have a trapped  ventricle in her brain requiring placement of ventriculostomy.  She remains  ventilator-dependent.  I was asked to evaluate for trach and PEG.   PAST MEDICAL HISTORY:  Past history is gleaned from the chart.  The patient  is intubated.  Past medical history includes emphysema, chronic bronchitis  and a cerebral aneurysm. Seizure disorder.   PAST SURGICAL HISTORY:  Craniotomy for the aneurysm about 20 years ago.   HOME MEDICATIONS:  Inhaled steroid, Combivent and Dilantin.   SOCIAL HISTORY:  Prior to admission she was smoking, urine drug screen on  admission was positive for illicit drugs.   REVIEW OF SYSTEMS:  Unable to be obtained due to patient being intubated.   PHYSICAL EXAMINATION:  VITAL SIGNS:  Temperature is 99.5.  Blood pressure  130/75, heart rate 100, respirations 12 on the ventilator.  GENERAL:  She moves some to stimulation.  LUNGS:  Clear to auscultation.  CARDIAC:  Heart is regular.  ABDOMEN:  Soft without significant surgical scars.  SKIN:  Warm and dry   IMPRESSION:  1.  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  2.  Need for a tracheostomy for airway protection and percutaneous      endoscopic gastrostomy tube placement for enteral access.   PLAN:  We will schedule her for bedside tracheostomy and PEG tube placement  later this week.  This  was discussed with Dr. Delford Field.      Gabrielle Dare Janee Morn, M.D.  Electronically Signed     BET/MEDQ  D:  09/02/2005  T:  09/03/2005  Job:  [id number]   cc:   Payton Doughty, M.D.  Fax: [phone]   Shan Levans, M.D. [facility]  [address]  [place]  [place] [address]

## 2010-09-14 NOTE — Discharge Summary (Signed)
Wanda Prince, Wanda Prince                 ACCOUNT NO.:  [id number]   MEDICAL RECORD NO.:  [id number]          PATIENT TYPE:  OBV   LOCATION:  4739                         FACILITY:  [facility]   PHYSICIAN:  Edsel Petrin, D.O.DATE OF BIRTH:  [birth year]   DATE OF ADMISSION:  01/20/2007  DATE OF DISCHARGE:  01/21/2007                               DISCHARGE SUMMARY   DISCHARGE DIAGNOSES:  1. Seizure disorder, postictal on admission.  2. 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  3. History of respiratory arrest secondary to #2 in April 2007.  4. History of suicide attempts August 2006 and July 2006.  5. History of alcohol abuse.  6. History of cocaine abuse.  7. History of major depressive disorder.  8. History of iron deficiency anemia.  9. Chronic obstructive pulmonary disease, stable this admission.  10.History of alcoholic hepatitis.  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  12.Multiple psychiatric diagnoses.   DISCHARGE MEDICATIONS:  1. Dilantin 200 mg p.o. b.i.d.  2. Protonix 40 mg p.o. daily.  3. Seroquel 25 mg nightly.  4. Pulmicort 0.25 mg three times a day.  5. Albuterol 90 mcg inhaler 1-2 puffs p.r.n. for wheezing.   DISPOSITION AND FOLLOWUP:  Wanda Prince is being discharged from the  hospital in stable and improved condition.  She has an appointment with  [facility] in 1-2 weeks with Dr. Fannie Knee Drinkard.  At the time of followup  her substance abuse issues in relation to her seizure disorder will need  to be discussed.  She will also need a Dilantin level.   BRIEF ADMITTING H&P:  Wanda Prince is a 55 year old woman with a past  medical history significant for seizure disorder well controlled until  this point on twice daily Dilantin who began having seizures after  surgery for an AVM over 20 years ago which was complicated by an  intracranial bleed.  During that hospitalization, she had a  respiratory  arrest and subependymal abscesses.  After discharge from the  hospitalization, she had multiple life stressors, and concurrent  psychiatric diagnoses with multiple suicide attempts and polysubstance  abuse.  She presented to the ED on the day of admission via EMS after  being found unresponsive.  She had a witnessed seizure en route with  EMS; and, again, another seizure in the ED.  Ativan controlled her  seizures, acutely.  She was postictal at the time she was examined by  our team.  She was responsive but disoriented.  She was able to  articulate that she has several seizures a year, and knows when they are  coming on.  She was not forthcoming with her last drug or alcohol use.   ADMISSION LABORATORIES:  An i-STAT labs:  Sodium 138, potassium 4.1,  chloride 110, bicarb 19.7, BUN 15, creatinine 0.8, glucose 229.  WBC  9.1, hemoglobin 15.3, platelets 186.  Dilantin level 9.8.  Alcohol level  less than 5.  An UDS was positive for cocaine.  Blood cultures were  negative x2.  A  chest x-ray showed a small left effusion with patchy air  space opacity in the lung bases bilaterally with the left being worse  than the right.   VITAL SIGNS ON ADMISSION:  Temperature 98.1, blood pressure 164/94,  pulse 114, respiratory rate 24, O2 saturation 100% on 5 liters.   HOSPITAL COURSE BY PROBLEM:  Problem #1:  SEIZURE.  Since Wanda Prince had  witnessed seizure both en route by EMS and in the emergency department,  we treated her for postictal state.  We gave her aggressive hydration,  seizure precautions, started a Dilantin load, and started her on p.r.n.  Ativan for any seizure activity.  Notably her Dilantin level was  subtherapeutic.  Her UDS was also positive for cocaine.  It may be  likely that her seizure was precipitated by cocaine and potentially  alcohol use.  It seems her seizure disorder is a chronic problem brought  on by a history of multiple brain surgeries and abnormalities.   This is  not an acute event in terms of needing an extensive workup.  Her  substance abuse issues were discussed with her at length, and how they  can worsen her seizure disorder.  She had no seizure activity while in  the hospital for 24 hours.  The patient was feeling significantly better  on the day of discharge requesting to leave the hospital.  At discharge  she was started on her home dose of Dilantin 200 mg p.o. b.i.d.  It was  stressed to her that she should take this medication daily.  In followup  she will need to have her levels checked.  CT of her head was negative  for more ominous causes of her seizure including any mass effect or new  structural lesions.  All findings on the CT were post chronic  postoperative changes given her history.  Her electrolytes were also  within normal limits, and there was no metabolic derangement.   Problem #2:  SUBSTANCE ABUSE.  The patient was started immediately on  thiamine and folate on admission.  She was placed on the seizure  protocol.  Ativan was used for seizure control and symptom control.  Social work was asked to see the patient in regards to inpatient  rehabilitation and outpatient programs for substance abuse.  The patient  was not interested at this time.   Problem #3:  BILATERAL PULMONARY INFILTRATES.  The patient has a history  of COPD and with her acute loss of consciousness during her procedure  she may have possibly aspirated.  She was afebrile.  She did not have an  elevation in her white count.  She may have had an aspiration  pneumonitis.  We did not start her on any antibiotics at discharge.  She  was instructed that if her symptoms worsened, or if she develops a cough  or fever, that she should be seen by her primary care doctor for a  course of antibiotics, or return to the emergency department for  evaluation.   Problem #4:  DEPRESSION.  We held her medications for her depression,  acutely, on admission.  After  she was seizure-free we instructed her  that she may resume her home meds as she was previously instructed to  take by her psychiatry physicians.  She will need medication  reconciliation in the primary care setting to make sure that she is  being maximized both on her seizure medication and her depression  treatment.   VITAL SIGNS ON DISCHARGE:  Temperature 97.4, blood pressure 136/91,  pulse 98, respiratory rate 18, O2 saturation 95% on 2 liters.   LABORATORIES ON DISCHARGE:  White blood cell count 11.3, hemoglobin  14.2, platelets 140.  Sodium 137, potassium 3.5, chloride 106, bicarb  25, BUN 8, creatinine 0.4, glucose 115, total bilirubin 1.1, alk phos  113, AST 31, ALT 40, total protein 5.9, albumin 3.3, calcium 8.2.  Dilantin level 15.1, corrected level 19.9 with a normal range being  between 10 and 20.      Edsel Petrin, D.O.  Electronically Signed    ELG/MEDQ  D:  02/09/2007  T:  02/10/2007  Job:  [id number]

## 2010-09-14 NOTE — Op Note (Signed)
NAMEJANAN, BOGIE NO.:  [id number]   MEDICAL RECORD NO.:  [id number]          PATIENT TYPE:  INP   LOCATION:  3109                         FACILITY:  [facility]   PHYSICIAN:  Payton Doughty, M.D.      DATE OF BIRTH:  1955/09/10   DATE OF PROCEDURE:  10/08/2005  DATE OF DISCHARGE:                                 OPERATIVE REPORT   PREOPERATIVE DIAGNOSIS:  Hydrocephalus.   POSTOPERATIVE DIAGNOSIS:  Hydrocephalus.   OPERATIVE PROCEDURE:  Right frontoventricular peritoneal shunt.   SURGEON:  Payton Doughty, M.D.   SERVICE:  Neurosurgery.   ANESTHESIA:  General endotracheal.   PREPARATION:  Prep with alcohol wipe.   COMPLICATIONS:  None.   NURSING ASSISTANT:  [place].   DOCTOR ASSISTANT:  Hewitt Shorts, M.D.   BODY OF TEXT:  This is a 55 year old right-handed white female who had a  subependymal abscess with tendon hydrocephalus.  She has had a  ventriculostomy for about 2-1/2 months and is now going for a shunt.  Taken  to the operating room, smoothly anesthetized and intubated.  Placed supine  on the operating room table.  The trach was sutured to the neck.  Following  shave, prep and drape in the usual sterile fashion, the skin incision for  the ventriculostomy was expanded.  The ventriculostomy removed, and a new  ventricular catheter placed.  A subcutaneous tunnel was created to the right  upper quadrant and the abdomen, where through a vertical incision, the  rectus abdominis was identified.  The posterior rectus wall was identified,  and the peritoneum was identified and opened.  The Codman programmable shunt  valve was attached to the ventriculostomy, led through the tunnel, and  passed from the abdomen.  It was secured to the ventricular catheter by 2-0  silk.  Good flow was established.  All incisions were closed with successive  layers of 2-0 Vicryl and 3-0 nylon.  Betadine and Telfa dressings were  applied at all sites.  The trach collar  replaced.  The patient returned to  the ICU in good condition.           ______________________________  Payton Doughty, M.D.     MWR/MEDQ  D:  10/08/2005  T:  10/08/2005  Job:  [id number]

## 2010-09-14 NOTE — Discharge Summary (Signed)
Wanda Prince, Wanda Prince                           ACCOUNT NO.:  [id number]   MEDICAL RECORD NO.:  [id number]                   PATIENT TYPE:  INP   LOCATION:  3730                                 FACILITY:  [facility]   PHYSICIAN:  Deirdre Peer. Polite, M.D.              DATE OF BIRTH:  1955-07-14   DATE OF ADMISSION:  07/30/2003  DATE OF DISCHARGE:                                 DISCHARGE SUMMARY   DISCHARGE DIAGNOSES:  1. Dilantin overdose.  2. Suicide attempt.  3. Alcoholism.  4. History of seizure disorder.   DISCHARGE MEDICATIONS:  Dilantin 200 mg q.12 h.  Start taking medication on  August 03, 2003.   STUDIES ON ADMISSION:  The patient had an alcohol level of 66.  Dilantin  level 13 on admission.  Follow up Dilantin level 33.  Salicylate level less  than 4.  Acetaminophen level less than 10.  Dilantin level at discharge  16.4.  AST/ ALT on admission 68 and 67 respectively; at discharge 43 and 49.   DISPOSITION:  The patient is discharged to home in stable condition and  asked to follow up in ADS, also abstain from alcohol.   CONSULTATION:  Dr. Antonietta Breach, psychiatry.   HISTORY OF PRESENT ILLNESS:  A 55 year old white female with a known history  of seizure disorder, presented to the ED after a suicide attempt.  The  patient took approximately 30 Dilantin and consumed approximately 12 cans of  beer.  In the ED, the patient was evaluated and admission was deemed  necessary for observation for poison control recommendation.   HOSPITAL COURSE:  The patient was admitted to medicine telemetry floor for  evaluation and treatment of drug overdose for Dilantin and suicide attempt.  The patient had serial blood work which initially showed a peak in her  Dilantin level of 33.  However, Dilantin on followup day labs, the patient's  Dilantin trended down to 16.  At discharge, the patient did not have any  major problems as a result of her Dilantin toxicity except for mild  elevations in her  LFT's which were most likely secondary to her alcohol  ingestion.  The patient's LFT's have improved as stated above.  The patient  was ultimately seen by psychiatry, given a diagnosis of adjustment disorder  with mixed emotional features, and polysubstance dependence.  The patient  was cleared from suicide precautions and was recommended to have outpatient  followup for ADS.   At this time, the patient is medically stable for discharge to home.   DISCHARGE INSTRUCTIONS:  1. The patient has been instructed to take her Dilantin only as prescribed.  2. Asked to abstain from alcohol.  3. Ask to follow up with her primary care giver, [facility].  Deirdre Peer. Polite, M.D.    RDP/MEDQ  D:  08/02/2003  T:  08/04/2003  Job:  [id number]   cc:   [facility]   Dory Horn, Dr.  [facility]

## 2010-09-14 NOTE — Discharge Summary (Signed)
Wanda Prince, Wanda Prince                 ACCOUNT NO.:  [id number]   MEDICAL RECORD NO.:  [id number]          PATIENT TYPE:  INP   LOCATION:  4738                         FACILITY:  [facility]   PHYSICIAN:  Michaelyn Barter, M.D. DATE OF BIRTH:  17-Nov-1955   DATE OF ADMISSION:  05/07/2005  DATE OF DISCHARGE:  05/09/2005                                 DISCHARGE SUMMARY   FINAL DIAGNOSIS:  1.  Acute seizure episode.  2.  Elevated liver enzymes.   PROCEDURES:  1.  Portable chest x-ray completed May 07, 2005.  2.  CT of the head without contrast completed May 07, 2005.   HISTORY OF PRESENT ILLNESS:  Wanda Prince history was initially provided by  Wanda Prince who had the opportunity to discuss with the family what had  precipitated her admission to the emergency department.  According to Dr.  Read Prince, Wanda Prince had been doing fairly well recently.  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  Sometime shortly  afterwards, she began to have a seizure and was brought to the hospital  emergency department for further evaluation.  For past medical history,  please see the admitting history and physical dictated by Wanda Prince on  May 07, 2005.   HOSPITAL COURSE:  Problem 1:  Seizure episode.  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  A smaller area of encephalomalacia in the left frontal lobe  with some increase in density around the cystic area.  This is most likely  dystrophic calcification with acute hemorrhage not completely excluded.  The  patient was returned to the room and started on Dilantin.  By the following  day, the patient indicated that she felt fine and requested to be discharged  home from the hospital.  The  patient has not had any repeat seizure episodes  over the course of the hospitalization and as of today, she states she feels  good.   Problem 2:  Elevated liver enzymes.  The etiology of this is questionable.  On May 08, 2005, the patient's SGOT was 48, SGPT 48.  By the following  day, her enzymes were noted to trend down.  The SGOT is 38 and SGPT 36.  An  ultrasound of the abdomen has been completed.  The patient denies having any  abdominal pain and when I discuss elevated enzymes with the patient she  stated that she knew about it for quite sometime.  She stated that she has  had a tendency for her enzymes to go up and down.  She assures me that this  is being followed by her doctors at [facility].   CONDITION ON DISCHARGE:  I had received a page from the nurse indicating  that the patient was ready to be discharged home and stated that the patient  had threatened to leave  [facility] if she was not discharged soon.  The patient,  herself, denies having any current complaints, no abdominal pain, and she  requests to be discharged home this morning.   Her vital signs reveal temperature 97.2, heart rate 67, respirations 20,  blood pressure 112/72, O2 saturation 97% on room air.  The decision has been  made to discharge the patient home.   DISCHARGE MEDICATIONS:  Combivent 2 puffs b.i.d., Dilantin 200 mg b.i.d.   DISCHARGE INSTRUCTIONS:  She is instructed to follow up at [facility]  within the next 2-4 weeks.      Michaelyn Barter, M.D.  Electronically Signed     OR/MEDQ  D:  05/09/2005  T:  05/09/2005  Job:  [id number]

## 2010-09-25 ENCOUNTER — Other Ambulatory Visit: Payer: Self-pay | Admitting: Internal Medicine

## 2010-10-07 IMAGING — CR DG ABD PORTABLE 1V
1 series · 2 of 2 positions shown · non-contrast
Comparison: 08/27/2005

CLINICAL DATA: Colonoscopy.  Abdominal pain.

ABDOMEN - 1 VIEW

[Series 1: series 1 · U · 2 of 2 slices shown]
[im 1/2]
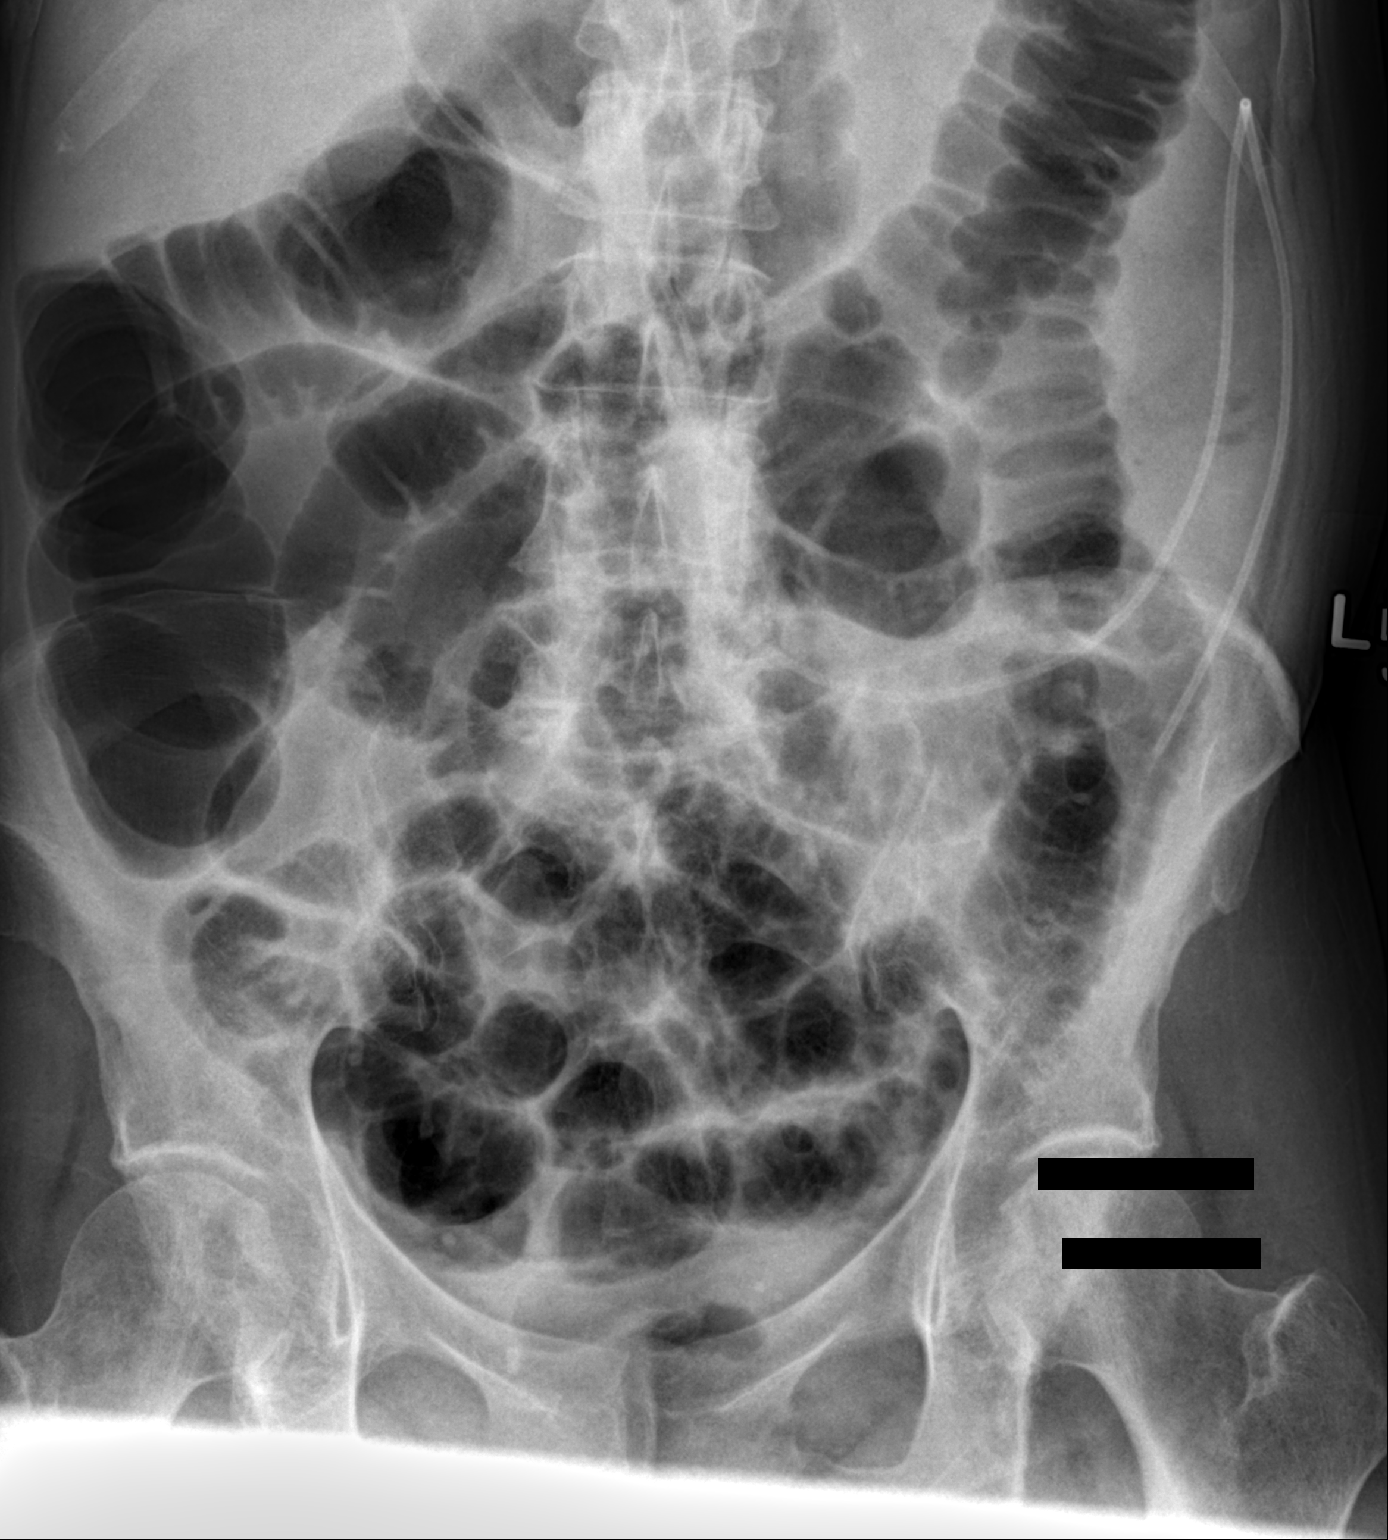
[im 2/2]
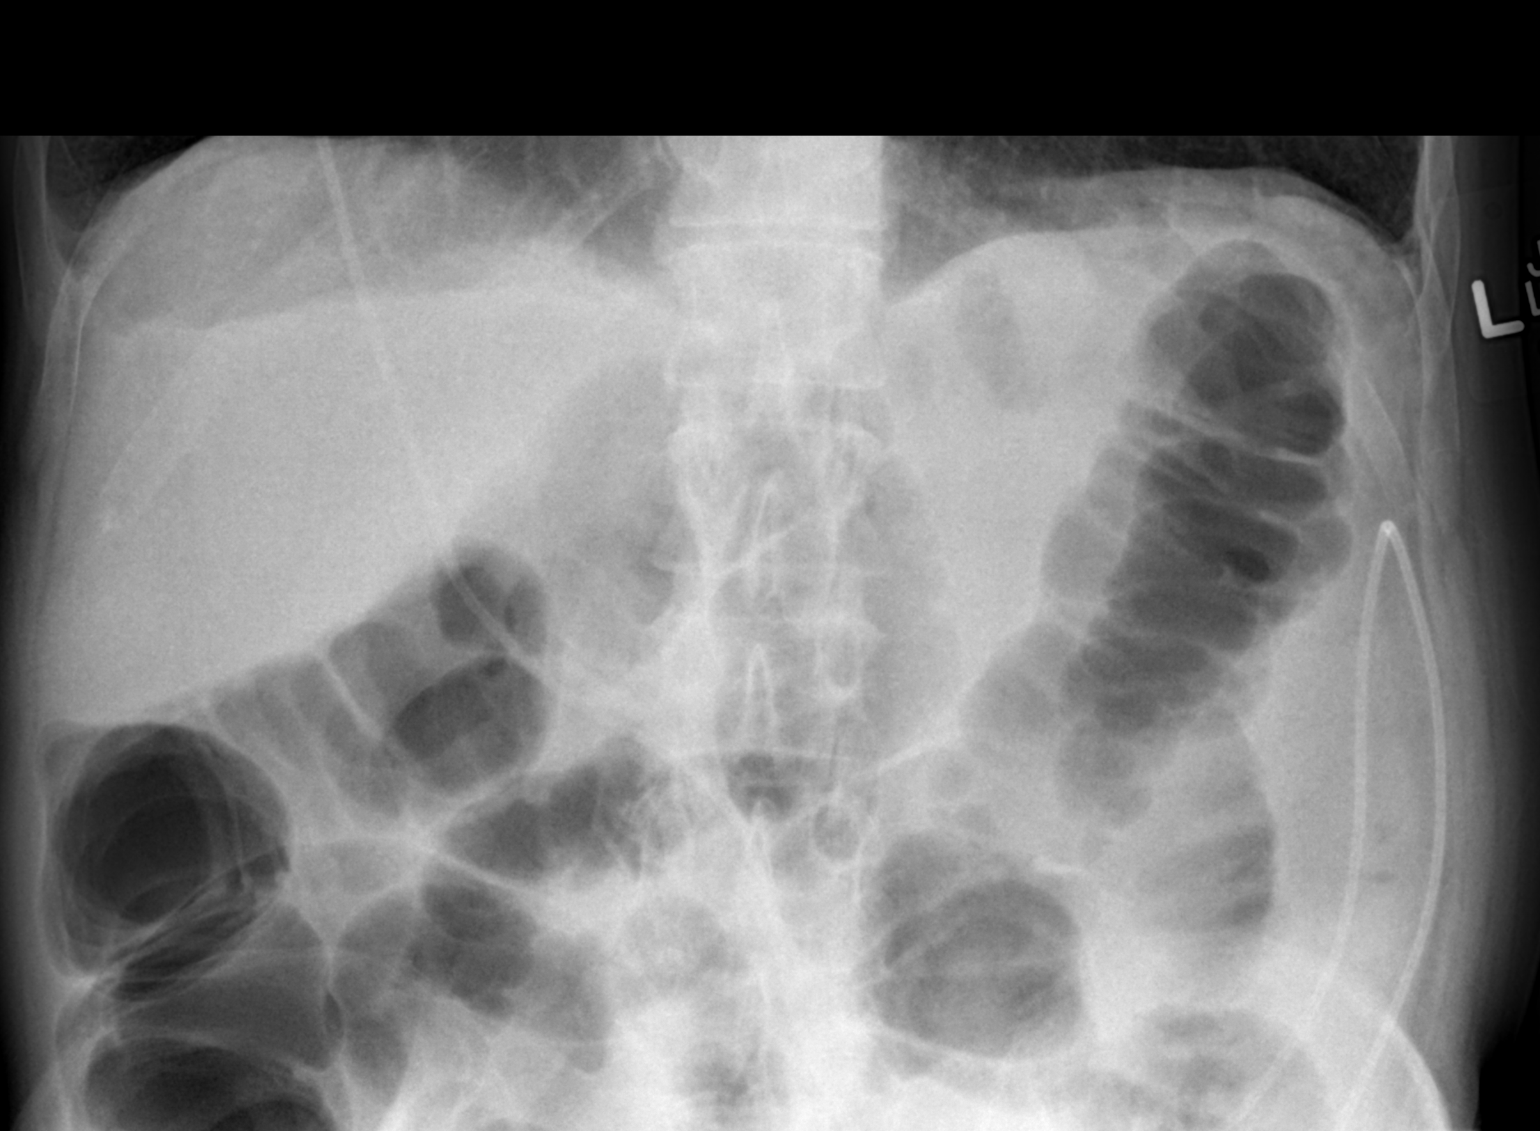

[2 of 2 positions shown; findings below may reference images not displayed]

FINDINGS: Mild diffuse gaseous distention of the colon and small
bowel is identified compatible with recent colonoscopy.
No gross evidence of pneumoperitoneum is noted.
A VP shunt is present within the abdomen.
No acute bony abnormalities are noted.
IMPRESSION: Mild diffuse gaseous distention of bowel compatible with recent
colonoscopy. No gross evidence of pneumoperitoneum but consider
erect/left lateral decubitus films or CT if there is clinical
suspicion for free air.

## 2010-11-05 ENCOUNTER — Other Ambulatory Visit: Payer: Self-pay | Admitting: Internal Medicine

## 2010-11-05 ENCOUNTER — Other Ambulatory Visit: Payer: Self-pay | Admitting: Family Medicine

## 2010-11-05 DIAGNOSIS — Z1231 Encounter for screening mammogram for malignant neoplasm of breast: Secondary | ICD-10-CM

## 2010-11-14 ENCOUNTER — Ambulatory Visit
Admission: RE | Admit: 2010-11-14 | Discharge: 2010-11-14 | Disposition: A | Discharge status: Home / Self Care | Payer: Medicaid Other | Source: Ambulatory Visit | Attending: Family Medicine | Admitting: Family Medicine

## 2010-11-14 DIAGNOSIS — Z1231 Encounter for screening mammogram for malignant neoplasm of breast: Secondary | ICD-10-CM

## 2010-12-15 ENCOUNTER — Emergency Department (HOSPITAL_COMMUNITY): Payer: Medicaid Other

## 2010-12-15 ENCOUNTER — Emergency Department (HOSPITAL_COMMUNITY)
Admission: EM | Admit: 2010-12-15 | Discharge: 2010-12-15 | Disposition: A | Discharge status: Home / Self Care | Payer: Medicaid Other | Attending: Emergency Medicine | Admitting: Emergency Medicine

## 2010-12-15 DIAGNOSIS — F29 Unspecified psychosis not due to a substance or known physiological condition: Secondary | ICD-10-CM | POA: Insufficient documentation

## 2010-12-15 DIAGNOSIS — J438 Other emphysema: Secondary | ICD-10-CM | POA: Insufficient documentation

## 2010-12-15 DIAGNOSIS — Z8679 Personal history of other diseases of the circulatory system: Secondary | ICD-10-CM | POA: Insufficient documentation

## 2010-12-15 DIAGNOSIS — R569 Unspecified convulsions: Secondary | ICD-10-CM | POA: Insufficient documentation

## 2010-12-15 DIAGNOSIS — I10 Essential (primary) hypertension: Secondary | ICD-10-CM | POA: Insufficient documentation

## 2010-12-15 LAB — URINALYSIS, ROUTINE W REFLEX MICROSCOPIC
Bilirubin Urine: NEGATIVE
Glucose, UA: 100 mg/dL — AB
Hgb urine dipstick: NEGATIVE
Ketones, ur: NEGATIVE mg/dL
Leukocytes, UA: NEGATIVE
Nitrite: NEGATIVE
Protein, ur: NEGATIVE mg/dL
Specific Gravity, Urine: 1.022 (ref 1.005–1.030)
Urobilinogen, UA: 0.2 mg/dL (ref 0.0–1.0)
pH: 6 (ref 5.0–8.0)

## 2010-12-15 LAB — PROTIME-INR
INR: 0.98 (ref 0.00–1.49)
Prothrombin Time: 13.2 seconds (ref 11.6–15.2)

## 2010-12-15 LAB — DIFFERENTIAL
Basophils Absolute: 0 10*3/uL (ref 0.0–0.1)
Basophils Relative: 0 % (ref 0–1)
Eosinophils Absolute: 0.1 10*3/uL (ref 0.0–0.7)
Eosinophils Relative: 1 % (ref 0–5)
Lymphocytes Relative: 17 % (ref 12–46)
Lymphs Abs: 1.1 10*3/uL (ref 0.7–4.0)
Monocytes Absolute: 0.6 10*3/uL (ref 0.1–1.0)
Monocytes Relative: 9 % (ref 3–12)
Neutro Abs: 4.9 10*3/uL (ref 1.7–7.7)
Neutrophils Relative %: 74 % (ref 43–77)

## 2010-12-15 LAB — RAPID URINE DRUG SCREEN, HOSP PERFORMED
Amphetamines: NOT DETECTED
Barbiturates: POSITIVE — AB
Benzodiazepines: POSITIVE — AB
Cocaine: NOT DETECTED
Opiates: NOT DETECTED
Tetrahydrocannabinol: NOT DETECTED

## 2010-12-15 LAB — PHENYTOIN LEVEL, TOTAL: Phenytoin Lvl: 9.5 ug/mL — ABNORMAL LOW (ref 10.0–20.0)

## 2010-12-15 LAB — CBC
HCT: 41.7 % (ref 36.0–46.0)
Hemoglobin: 14.5 g/dL (ref 12.0–15.0)
MCH: 34.9 pg — ABNORMAL HIGH (ref 26.0–34.0)
MCHC: 34.8 g/dL (ref 30.0–36.0)
MCV: 100.5 fL — ABNORMAL HIGH (ref 78.0–100.0)
Platelets: 129 10*3/uL — ABNORMAL LOW (ref 150–400)
RBC: 4.15 MIL/uL (ref 3.87–5.11)
RDW: 12.4 % (ref 11.5–15.5)
WBC: 6.6 10*3/uL (ref 4.0–10.5)

## 2010-12-15 LAB — BASIC METABOLIC PANEL
BUN: 11 mg/dL (ref 6–23)
CO2: 30 mEq/L (ref 19–32)
Calcium: 9 mg/dL (ref 8.4–10.5)
Chloride: 98 mEq/L (ref 96–112)
Creatinine, Ser: <0.47 mg/dL — ABNORMAL LOW (ref 0.50–1.10)
Glucose, Bld: 123 mg/dL — ABNORMAL HIGH (ref 70–99)
Potassium: 3.7 mEq/L (ref 3.5–5.1)
Sodium: 136 mEq/L (ref 135–145)

## 2010-12-15 LAB — GLUCOSE, CAPILLARY: Glucose-Capillary: 158 mg/dL — ABNORMAL HIGH (ref 70–99)

## 2010-12-15 LAB — ETHANOL: Alcohol, Ethyl (B): <11 mg/dL (ref 0–11)

## 2010-12-15 LAB — POCT PREGNANCY, URINE: Preg Test, Ur: NEGATIVE

## 2011-02-07 ENCOUNTER — Inpatient Hospital Stay (HOSPITAL_COMMUNITY)
Admission: EM | Admit: 2011-02-07 | Discharge: 2011-02-11 | DRG: 208 | Discharge status: Against Medical Advice | Payer: Medicaid Other | Attending: Pulmonary Disease | Admitting: Pulmonary Disease

## 2011-02-07 ENCOUNTER — Emergency Department (HOSPITAL_COMMUNITY): Payer: Medicaid Other

## 2011-02-07 DIAGNOSIS — Z982 Presence of cerebrospinal fluid drainage device: Secondary | ICD-10-CM

## 2011-02-07 DIAGNOSIS — G40909 Epilepsy, unspecified, not intractable, without status epilepticus: Secondary | ICD-10-CM | POA: Diagnosis present

## 2011-02-07 DIAGNOSIS — J9620 Acute and chronic respiratory failure, unspecified whether with hypoxia or hypercapnia: Principal | ICD-10-CM | POA: Diagnosis present

## 2011-02-07 DIAGNOSIS — E876 Hypokalemia: Secondary | ICD-10-CM | POA: Diagnosis not present

## 2011-02-07 DIAGNOSIS — Z23 Encounter for immunization: Secondary | ICD-10-CM

## 2011-02-07 DIAGNOSIS — J441 Chronic obstructive pulmonary disease with (acute) exacerbation: Secondary | ICD-10-CM | POA: Diagnosis present

## 2011-02-07 DIAGNOSIS — E871 Hypo-osmolality and hyponatremia: Secondary | ICD-10-CM | POA: Diagnosis present

## 2011-02-07 DIAGNOSIS — F17200 Nicotine dependence, unspecified, uncomplicated: Secondary | ICD-10-CM | POA: Diagnosis present

## 2011-02-07 LAB — BASIC METABOLIC PANEL
BUN: 6 mg/dL (ref 6–23)
CO2: 26 mEq/L (ref 19–32)
Calcium: 8.6 mg/dL (ref 8.4–10.5)
Chloride: 93 mEq/L — ABNORMAL LOW (ref 96–112)
Creatinine, Ser: <0.47 mg/dL — ABNORMAL LOW (ref 0.50–1.10)
Glucose, Bld: 177 mg/dL — ABNORMAL HIGH (ref 70–99)
Potassium: 3.3 mEq/L — ABNORMAL LOW (ref 3.5–5.1)
Sodium: 130 mEq/L — ABNORMAL LOW (ref 135–145)

## 2011-02-07 LAB — DIFFERENTIAL
Basophils Absolute: 0
Basophils Absolute: 0.1
Basophils Absolute: 0 10*3/uL (ref 0.0–0.1)
Basophils Relative: 0
Basophils Relative: 1
Basophils Relative: 0 % (ref 0–1)
Eosinophils Absolute: 0
Eosinophils Absolute: 0.2
Eosinophils Absolute: 0.1 10*3/uL (ref 0.0–0.7)
Eosinophils Relative: 0
Eosinophils Relative: 2
Eosinophils Relative: 1 % (ref 0–5)
Lymphocytes Relative: 11 — ABNORMAL LOW
Lymphocytes Relative: 35
Lymphocytes Relative: 22 % (ref 12–46)
Lymphs Abs: 1.3
Lymphs Abs: 3.2
Lymphs Abs: 1.2 10*3/uL (ref 0.7–4.0)
Monocytes Absolute: 0.2
Monocytes Absolute: 0.6
Monocytes Absolute: 0.9 10*3/uL (ref 0.1–1.0)
Monocytes Relative: 2 — ABNORMAL LOW
Monocytes Relative: 7
Monocytes Relative: 16 % — ABNORMAL HIGH (ref 3–12)
Neutro Abs: 5
Neutro Abs: 9.7 — ABNORMAL HIGH
Neutro Abs: 3.3 10*3/uL (ref 1.7–7.7)
Neutrophils Relative %: 56
Neutrophils Relative %: 86 — ABNORMAL HIGH
Neutrophils Relative %: 61 % (ref 43–77)

## 2011-02-07 LAB — URINALYSIS, ROUTINE W REFLEX MICROSCOPIC
Bilirubin Urine: NEGATIVE
Bilirubin Urine: NEGATIVE
Glucose, UA: NEGATIVE
Glucose, UA: NEGATIVE mg/dL
Hgb urine dipstick: NEGATIVE
Ketones, ur: NEGATIVE
Ketones, ur: NEGATIVE mg/dL
Leukocytes, UA: NEGATIVE
Leukocytes, UA: NEGATIVE
Nitrite: NEGATIVE
Nitrite: NEGATIVE
Protein, ur: NEGATIVE
Protein, ur: NEGATIVE mg/dL
Specific Gravity, Urine: 1.02
Specific Gravity, Urine: 1.025 (ref 1.005–1.030)
Urobilinogen, UA: 0.2
Urobilinogen, UA: 0.2 mg/dL (ref 0.0–1.0)
pH: 5 (ref 5.0–8.0)
pH: 6

## 2011-02-07 LAB — POCT I-STAT 3, ART BLOOD GAS (G3+)
Acid-Base Excess: 1 mmol/L (ref 0.0–2.0)
Bicarbonate: 30.1 mEq/L — ABNORMAL HIGH (ref 20.0–24.0)
O2 Saturation: 100 %
Patient temperature: 98.9
TCO2: 32 mmol/L (ref 0–100)
pCO2 arterial: 67.2 mmHg (ref 35.0–45.0)
pH, Arterial: 7.26 — ABNORMAL LOW (ref 7.350–7.400)
pO2, Arterial: 232 mmHg — ABNORMAL HIGH (ref 80.0–100.0)

## 2011-02-07 LAB — COMPREHENSIVE METABOLIC PANEL
ALT: 40 — ABNORMAL HIGH
ALT: 43 — ABNORMAL HIGH
AST: 31
AST: 34
Albumin: 3.3 — ABNORMAL LOW
Albumin: 3.3 — ABNORMAL LOW
Alkaline Phosphatase: 112
Alkaline Phosphatase: 113
BUN: 11
BUN: 8
CO2: 25
CO2: 28
Calcium: 8.2 — ABNORMAL LOW
Calcium: 8.3 — ABNORMAL LOW
Chloride: 106
Chloride: 106
Creatinine, Ser: 0.47
Creatinine, Ser: 0.49
GFR calc Af Amer: >60
GFR calc Af Amer: >60
GFR calc non Af Amer: >60
GFR calc non Af Amer: >60
Glucose, Bld: 113 — ABNORMAL HIGH
Glucose, Bld: 115 — ABNORMAL HIGH
Potassium: 3.5
Potassium: 3.5
Sodium: 137
Sodium: 139
Total Bilirubin: 0.9
Total Bilirubin: 1.1
Total Protein: 5.9 — ABNORMAL LOW
Total Protein: 6

## 2011-02-07 LAB — CULTURE, BLOOD (ROUTINE X 2)
Culture: NO GROWTH
Culture: NO GROWTH

## 2011-02-07 LAB — CBC
HCT: 42.7
HCT: 45.1
HCT: 41.4 % (ref 36.0–46.0)
Hemoglobin: 14.7
Hemoglobin: 15.3 — ABNORMAL HIGH
Hemoglobin: 14.1 g/dL (ref 12.0–15.0)
MCH: 33.9 pg (ref 26.0–34.0)
MCHC: 33.9
MCHC: 34.5
MCHC: 34.1 g/dL (ref 30.0–36.0)
MCV: 97.6
MCV: 98.8
MCV: 99.5 fL (ref 78.0–100.0)
Platelets: 140 — ABNORMAL LOW
Platelets: 186
Platelets: 138 10*3/uL — ABNORMAL LOW (ref 150–400)
RBC: 4.37
RBC: 4.56
RBC: 4.16 MIL/uL (ref 3.87–5.11)
RDW: 13.2
RDW: 13.4
RDW: 12.6 % (ref 11.5–15.5)
WBC: 11.3 — ABNORMAL HIGH
WBC: 9.1
WBC: 5.4 10*3/uL (ref 4.0–10.5)

## 2011-02-07 LAB — RAPID URINE DRUG SCREEN, HOSP PERFORMED
Amphetamines: NOT DETECTED
Barbiturates: NOT DETECTED
Benzodiazepines: NOT DETECTED
Cocaine: POSITIVE — AB
Opiates: NOT DETECTED
Tetrahydrocannabinol: NOT DETECTED

## 2011-02-07 LAB — ETHANOL: Alcohol, Ethyl (B): <5

## 2011-02-07 LAB — I-STAT 8, (EC8 V) (CONVERTED LAB)
Acid-base deficit: 9 — ABNORMAL HIGH
BUN: 15
Bicarbonate: 19.7 — ABNORMAL LOW
Chloride: 110
Glucose, Bld: 229 — ABNORMAL HIGH
HCT: 49 — ABNORMAL HIGH
Hemoglobin: 16.7 — ABNORMAL HIGH
Operator id: 270111
Potassium: 4.1
Sodium: 138
TCO2: 21
pCO2, Ven: 49.9
pH, Ven: 7.204 — ABNORMAL LOW

## 2011-02-07 LAB — URINE MICROSCOPIC-ADD ON

## 2011-02-07 LAB — POCT I-STAT, CHEM 8
BUN: 7 mg/dL (ref 6–23)
Calcium, Ion: 1.12 mmol/L (ref 1.12–1.32)
Chloride: 91 mEq/L — ABNORMAL LOW (ref 96–112)
Creatinine, Ser: 0.8 mg/dL (ref 0.50–1.10)
Glucose, Bld: 104 mg/dL — ABNORMAL HIGH (ref 70–99)
HCT: 46 % (ref 36.0–46.0)
Hemoglobin: 15.6 g/dL — ABNORMAL HIGH (ref 12.0–15.0)
Potassium: 4.1 mEq/L (ref 3.5–5.1)
Sodium: 133 mEq/L — ABNORMAL LOW (ref 135–145)
TCO2: 32 mmol/L (ref 0–100)

## 2011-02-07 LAB — TSH: TSH: 0.515

## 2011-02-07 LAB — POCT I-STAT CREATININE
Creatinine, Ser: 0.8
Operator id: 270111

## 2011-02-07 LAB — PHENYTOIN LEVEL, TOTAL
Phenytoin Lvl: 15.1
Phenytoin Lvl: 21.2 ug/mL — ABNORMAL HIGH (ref 10.0–20.0)
Phenytoin Lvl: 9.8 — ABNORMAL LOW

## 2011-02-07 LAB — PROTIME-INR
INR: 0.9
Prothrombin Time: 12.5

## 2011-02-07 LAB — SALICYLATE LEVEL: Salicylate Lvl: <4

## 2011-02-07 LAB — APTT: aPTT: 23 — ABNORMAL LOW

## 2011-02-08 ENCOUNTER — Inpatient Hospital Stay (HOSPITAL_COMMUNITY): Payer: Medicaid Other

## 2011-02-08 ENCOUNTER — Emergency Department (HOSPITAL_COMMUNITY): Payer: Medicaid Other

## 2011-02-08 DIAGNOSIS — G40309 Generalized idiopathic epilepsy and epileptic syndromes, not intractable, without status epilepticus: Secondary | ICD-10-CM

## 2011-02-08 DIAGNOSIS — R0902 Hypoxemia: Secondary | ICD-10-CM

## 2011-02-08 DIAGNOSIS — J4489 Other specified chronic obstructive pulmonary disease: Secondary | ICD-10-CM

## 2011-02-08 DIAGNOSIS — J449 Chronic obstructive pulmonary disease, unspecified: Secondary | ICD-10-CM

## 2011-02-08 DIAGNOSIS — J9600 Acute respiratory failure, unspecified whether with hypoxia or hypercapnia: Secondary | ICD-10-CM

## 2011-02-08 LAB — CARDIAC PANEL(CRET KIN+CKTOT+MB+TROPI)
CK, MB: 11 ng/mL (ref 0.3–4.0)
CK, MB: 7.4 ng/mL (ref 0.3–4.0)
CK, MB: 4.8 ng/mL — ABNORMAL HIGH (ref 0.3–4.0)
Relative Index: 2.7 — ABNORMAL HIGH (ref 0.0–2.5)
Relative Index: 2.9 — ABNORMAL HIGH (ref 0.0–2.5)
Relative Index: 2.7 — ABNORMAL HIGH (ref 0.0–2.5)
Total CK: 414 U/L — ABNORMAL HIGH (ref 7–177)
Total CK: 259 U/L — ABNORMAL HIGH (ref 7–177)
Total CK: 175 U/L (ref 7–177)
Troponin I: <0.3 ng/mL (ref <0.30)
Troponin I: <0.3 ng/mL (ref <0.30)
Troponin I: <0.3 ng/mL (ref <0.30)

## 2011-02-08 LAB — BLOOD GAS, ARTERIAL
Acid-Base Excess: 5.1 mmol/L — ABNORMAL HIGH (ref 0.0–2.0)
Bicarbonate: 30.3 mEq/L — ABNORMAL HIGH (ref 20.0–24.0)
Drawn by: 347641
FIO2: 0.6 %
MECHVT: 490 mL
O2 Saturation: 99.8 %
PEEP: 5 cmH2O
Patient temperature: 98.6
RATE: 15 resp/min
TCO2: 32 mmol/L (ref 0–100)
pCO2 arterial: 54.9 mmHg — ABNORMAL HIGH (ref 35.0–45.0)
pH, Arterial: 7.361 (ref 7.350–7.400)
pO2, Arterial: 239 mmHg — ABNORMAL HIGH (ref 80.0–100.0)

## 2011-02-08 LAB — COMPREHENSIVE METABOLIC PANEL
ALT: 46 U/L — ABNORMAL HIGH (ref 0–35)
AST: 45 U/L — ABNORMAL HIGH (ref 0–37)
Albumin: 2.9 g/dL — ABNORMAL LOW (ref 3.5–5.2)
Alkaline Phosphatase: 140 U/L — ABNORMAL HIGH (ref 39–117)
BUN: 5 mg/dL — ABNORMAL LOW (ref 6–23)
CO2: 31 mEq/L (ref 19–32)
Calcium: 8.4 mg/dL (ref 8.4–10.5)
Chloride: 97 mEq/L (ref 96–112)
Creatinine, Ser: <0.47 mg/dL — ABNORMAL LOW (ref 0.50–1.10)
Glucose, Bld: 81 mg/dL (ref 70–99)
Potassium: 3.7 mEq/L (ref 3.5–5.1)
Sodium: 135 mEq/L (ref 135–145)
Total Bilirubin: 0.2 mg/dL — ABNORMAL LOW (ref 0.3–1.2)
Total Protein: 6.1 g/dL (ref 6.0–8.3)

## 2011-02-08 LAB — CBC
HCT: 36.9 % (ref 36.0–46.0)
Hemoglobin: 12.7 g/dL (ref 12.0–15.0)
MCH: 34.1 pg — ABNORMAL HIGH (ref 26.0–34.0)
MCHC: 34.4 g/dL (ref 30.0–36.0)
MCV: 99.2 fL (ref 78.0–100.0)
Platelets: 122 10*3/uL — ABNORMAL LOW (ref 150–400)
RBC: 3.72 MIL/uL — ABNORMAL LOW (ref 3.87–5.11)
RDW: 12.6 % (ref 11.5–15.5)
WBC: 3.9 10*3/uL — ABNORMAL LOW (ref 4.0–10.5)

## 2011-02-08 LAB — URINE DRUGS OF ABUSE SCREEN W ALC, ROUTINE (REF LAB)
Amphetamine Screen, Ur: NEGATIVE
Barbiturate Quant, Ur: NEGATIVE
Benzodiazepines.: POSITIVE — AB
Cocaine Metabolites: NEGATIVE
Creatinine,U: 30.5 mg/dL
Ethyl Alcohol: <10 mg/dL (ref <10)
Marijuana Metabolite: NEGATIVE
Methadone: NEGATIVE
Opiate Screen, Urine: NEGATIVE
Phencyclidine (PCP): NEGATIVE
Propoxyphene: NEGATIVE

## 2011-02-08 LAB — LEGIONELLA ANTIGEN, URINE: Legionella Antigen, Urine: NEGATIVE

## 2011-02-08 LAB — STREP PNEUMONIAE URINARY ANTIGEN: Strep Pneumo Urinary Antigen: POSITIVE — AB

## 2011-02-08 LAB — GLUCOSE, CAPILLARY
Glucose-Capillary: 139 mg/dL — ABNORMAL HIGH (ref 70–99)
Glucose-Capillary: 104 mg/dL — ABNORMAL HIGH (ref 70–99)
Glucose-Capillary: 116 mg/dL — ABNORMAL HIGH (ref 70–99)
Glucose-Capillary: 132 mg/dL — ABNORMAL HIGH (ref 70–99)

## 2011-02-08 LAB — APTT: aPTT: 31 seconds (ref 24–37)

## 2011-02-08 LAB — PROTIME-INR
INR: 1.02 (ref 0.00–1.49)
Prothrombin Time: 13.6 seconds (ref 11.6–15.2)

## 2011-02-08 LAB — MRSA PCR SCREENING: MRSA by PCR: NEGATIVE

## 2011-02-09 ENCOUNTER — Inpatient Hospital Stay (HOSPITAL_COMMUNITY): Payer: Medicaid Other

## 2011-02-09 DIAGNOSIS — G40309 Generalized idiopathic epilepsy and epileptic syndromes, not intractable, without status epilepticus: Secondary | ICD-10-CM

## 2011-02-09 DIAGNOSIS — R0902 Hypoxemia: Secondary | ICD-10-CM

## 2011-02-09 DIAGNOSIS — J9600 Acute respiratory failure, unspecified whether with hypoxia or hypercapnia: Secondary | ICD-10-CM

## 2011-02-09 DIAGNOSIS — J449 Chronic obstructive pulmonary disease, unspecified: Secondary | ICD-10-CM

## 2011-02-09 LAB — GLUCOSE, CAPILLARY
Glucose-Capillary: 100 mg/dL — ABNORMAL HIGH (ref 70–99)
Glucose-Capillary: 129 mg/dL — ABNORMAL HIGH (ref 70–99)
Glucose-Capillary: 122 mg/dL — ABNORMAL HIGH (ref 70–99)
Glucose-Capillary: 105 mg/dL — ABNORMAL HIGH (ref 70–99)
Glucose-Capillary: 97 mg/dL (ref 70–99)
Glucose-Capillary: 188 mg/dL — ABNORMAL HIGH (ref 70–99)

## 2011-02-09 LAB — BASIC METABOLIC PANEL
BUN: 9 mg/dL (ref 6–23)
CO2: 33 mEq/L — ABNORMAL HIGH (ref 19–32)
Calcium: 8.6 mg/dL (ref 8.4–10.5)
Chloride: 102 mEq/L (ref 96–112)
Creatinine, Ser: <0.47 mg/dL — ABNORMAL LOW (ref 0.50–1.10)
Glucose, Bld: 108 mg/dL — ABNORMAL HIGH (ref 70–99)
Potassium: 4.1 mEq/L (ref 3.5–5.1)
Sodium: 141 mEq/L (ref 135–145)

## 2011-02-09 LAB — CBC
HCT: 38.4 % (ref 36.0–46.0)
Hemoglobin: 12.8 g/dL (ref 12.0–15.0)
MCH: 34 pg (ref 26.0–34.0)
MCHC: 33.3 g/dL (ref 30.0–36.0)
MCV: 102.1 fL — ABNORMAL HIGH (ref 78.0–100.0)
Platelets: 132 10*3/uL — ABNORMAL LOW (ref 150–400)
RBC: 3.76 MIL/uL — ABNORMAL LOW (ref 3.87–5.11)
RDW: 12.9 % (ref 11.5–15.5)
WBC: 5.9 10*3/uL (ref 4.0–10.5)

## 2011-02-09 LAB — PHENYTOIN LEVEL, TOTAL: Phenytoin Lvl: 18.2 ug/mL (ref 10.0–20.0)

## 2011-02-10 ENCOUNTER — Inpatient Hospital Stay (HOSPITAL_COMMUNITY): Payer: Medicaid Other

## 2011-02-10 LAB — CBC
HCT: 40.8 % (ref 36.0–46.0)
Hemoglobin: 13.8 g/dL (ref 12.0–15.0)
MCH: 34.2 pg — ABNORMAL HIGH (ref 26.0–34.0)
MCHC: 33.8 g/dL (ref 30.0–36.0)
MCV: 101 fL — ABNORMAL HIGH (ref 78.0–100.0)
Platelets: 134 10*3/uL — ABNORMAL LOW (ref 150–400)
RBC: 4.04 MIL/uL (ref 3.87–5.11)
RDW: 12.6 % (ref 11.5–15.5)
WBC: 6.8 10*3/uL (ref 4.0–10.5)

## 2011-02-10 LAB — BASIC METABOLIC PANEL
BUN: 11 mg/dL (ref 6–23)
CO2: 33 mEq/L — ABNORMAL HIGH (ref 19–32)
Calcium: 8.8 mg/dL (ref 8.4–10.5)
Chloride: 101 mEq/L (ref 96–112)
Creatinine, Ser: <0.47 mg/dL — ABNORMAL LOW (ref 0.50–1.10)
Glucose, Bld: 83 mg/dL (ref 70–99)
Potassium: 4 mEq/L (ref 3.5–5.1)
Sodium: 140 mEq/L (ref 135–145)

## 2011-02-10 LAB — GLUCOSE, CAPILLARY
Glucose-Capillary: 100 mg/dL — ABNORMAL HIGH (ref 70–99)
Glucose-Capillary: 111 mg/dL — ABNORMAL HIGH (ref 70–99)
Glucose-Capillary: 116 mg/dL — ABNORMAL HIGH (ref 70–99)
Glucose-Capillary: 89 mg/dL (ref 70–99)

## 2011-02-10 LAB — PHENYTOIN LEVEL, TOTAL: Phenytoin Lvl: 12.5 ug/mL (ref 10.0–20.0)

## 2011-02-11 LAB — GLUCOSE, CAPILLARY
Glucose-Capillary: 101 mg/dL — ABNORMAL HIGH (ref 70–99)
Glucose-Capillary: 178 mg/dL — ABNORMAL HIGH (ref 70–99)
Glucose-Capillary: 107 mg/dL — ABNORMAL HIGH (ref 70–99)
Glucose-Capillary: 127 mg/dL — ABNORMAL HIGH (ref 70–99)
Glucose-Capillary: 109 mg/dL — ABNORMAL HIGH (ref 70–99)

## 2011-02-11 LAB — BASIC METABOLIC PANEL
BUN: 10 mg/dL (ref 6–23)
CO2: 30 mEq/L (ref 19–32)
Calcium: 9.1 mg/dL (ref 8.4–10.5)
Chloride: 100 mEq/L (ref 96–112)
Creatinine, Ser: <0.47 mg/dL — ABNORMAL LOW (ref 0.50–1.10)
Glucose, Bld: 190 mg/dL — ABNORMAL HIGH (ref 70–99)
Potassium: 3.5 mEq/L (ref 3.5–5.1)
Sodium: 138 mEq/L (ref 135–145)

## 2011-02-11 LAB — CBC
HCT: 38.9 % (ref 36.0–46.0)
Hemoglobin: 13.1 g/dL (ref 12.0–15.0)
MCH: 33.9 pg (ref 26.0–34.0)
MCHC: 33.7 g/dL (ref 30.0–36.0)
MCV: 100.8 fL — ABNORMAL HIGH (ref 78.0–100.0)
Platelets: 145 10*3/uL — ABNORMAL LOW (ref 150–400)
RBC: 3.86 MIL/uL — ABNORMAL LOW (ref 3.87–5.11)
RDW: 12.7 % (ref 11.5–15.5)
WBC: 6.3 10*3/uL (ref 4.0–10.5)

## 2011-02-12 ENCOUNTER — Inpatient Hospital Stay (HOSPITAL_COMMUNITY)
Admission: EM | Admit: 2011-02-12 | Discharge: 2011-02-17 | DRG: 189 | Discharge status: Against Medical Advice | Payer: Medicaid Other | Attending: Internal Medicine | Admitting: Internal Medicine

## 2011-02-12 ENCOUNTER — Emergency Department (HOSPITAL_COMMUNITY): Payer: Medicaid Other

## 2011-02-12 DIAGNOSIS — E876 Hypokalemia: Secondary | ICD-10-CM | POA: Diagnosis present

## 2011-02-12 DIAGNOSIS — G40909 Epilepsy, unspecified, not intractable, without status epilepticus: Secondary | ICD-10-CM | POA: Diagnosis present

## 2011-02-12 DIAGNOSIS — J441 Chronic obstructive pulmonary disease with (acute) exacerbation: Secondary | ICD-10-CM | POA: Diagnosis present

## 2011-02-12 DIAGNOSIS — F1010 Alcohol abuse, uncomplicated: Secondary | ICD-10-CM | POA: Diagnosis present

## 2011-02-12 DIAGNOSIS — F411 Generalized anxiety disorder: Secondary | ICD-10-CM | POA: Diagnosis present

## 2011-02-12 DIAGNOSIS — Z982 Presence of cerebrospinal fluid drainage device: Secondary | ICD-10-CM

## 2011-02-12 DIAGNOSIS — I10 Essential (primary) hypertension: Secondary | ICD-10-CM | POA: Diagnosis present

## 2011-02-12 DIAGNOSIS — F17200 Nicotine dependence, unspecified, uncomplicated: Secondary | ICD-10-CM | POA: Diagnosis present

## 2011-02-12 DIAGNOSIS — J9620 Acute and chronic respiratory failure, unspecified whether with hypoxia or hypercapnia: Principal | ICD-10-CM | POA: Diagnosis present

## 2011-02-12 HISTORY — DX: Reserved for concepts with insufficient information to code with codable children: IMO0002

## 2011-02-12 HISTORY — DX: Meningitis, unspecified: G03.9

## 2011-02-12 HISTORY — DX: Epilepsy, unspecified, not intractable, without status epilepticus: G40.909

## 2011-02-12 LAB — BASIC METABOLIC PANEL
BUN: 7 mg/dL (ref 6–23)
CO2: 33 mEq/L — ABNORMAL HIGH (ref 19–32)
Calcium: 9.1 mg/dL (ref 8.4–10.5)
Chloride: 101 mEq/L (ref 96–112)
Creatinine, Ser: 0.36 mg/dL — ABNORMAL LOW (ref 0.50–1.10)
GFR calc Af Amer: >90 mL/min (ref >90)
GFR calc non Af Amer: >90 mL/min (ref >90)
Glucose, Bld: 128 mg/dL — ABNORMAL HIGH (ref 70–99)
Potassium: 3.3 mEq/L — ABNORMAL LOW (ref 3.5–5.1)
Sodium: 142 mEq/L (ref 135–145)

## 2011-02-12 LAB — POCT I-STAT 3, ART BLOOD GAS (G3+)
Acid-Base Excess: 7 mmol/L — ABNORMAL HIGH (ref 0.0–2.0)
Bicarbonate: 35.3 mEq/L — ABNORMAL HIGH (ref 20.0–24.0)
O2 Saturation: 98 %
TCO2: 37 mmol/L (ref 0–100)
pCO2 arterial: 63.7 mmHg (ref 35.0–45.0)
pH, Arterial: 7.352 (ref 7.350–7.400)
pO2, Arterial: 118 mmHg — ABNORMAL HIGH (ref 80.0–100.0)

## 2011-02-12 LAB — DIFFERENTIAL
Basophils Absolute: 0 10*3/uL (ref 0.0–0.1)
Basophils Relative: 0 % (ref 0–1)
Eosinophils Absolute: 0.1 10*3/uL (ref 0.0–0.7)
Eosinophils Relative: 1 % (ref 0–5)
Lymphocytes Relative: 22 % (ref 12–46)
Lymphs Abs: 1.9 10*3/uL (ref 0.7–4.0)
Monocytes Absolute: 0.9 10*3/uL (ref 0.1–1.0)
Monocytes Relative: 11 % (ref 3–12)
Neutro Abs: 5.7 10*3/uL (ref 1.7–7.7)
Neutrophils Relative %: 66 % (ref 43–77)

## 2011-02-12 LAB — CBC
HCT: 41.5 % (ref 36.0–46.0)
Hemoglobin: 14 g/dL (ref 12.0–15.0)
MCH: 33.9 pg (ref 26.0–34.0)
MCHC: 33.7 g/dL (ref 30.0–36.0)
MCV: 100.5 fL — ABNORMAL HIGH (ref 78.0–100.0)
Platelets: 150 10*3/uL (ref 150–400)
RBC: 4.13 MIL/uL (ref 3.87–5.11)
RDW: 12.9 % (ref 11.5–15.5)
WBC: 8.6 10*3/uL (ref 4.0–10.5)

## 2011-02-12 LAB — BENZODIAZEPINE, QUANTITATIVE, URINE
Alprazolam (GC/LC/MS), ur confirm: NEGATIVE NG/ML
Flurazepam GC/MS Conf: NEGATIVE NG/ML
Lorazepam UR QT: 206 NG/ML — ABNORMAL HIGH
Nordiazepam GC/MS Conf: NEGATIVE NG/ML
Oxazepam GC/MS Conf: NEGATIVE NG/ML
Temazepam GC/MS Conf: NEGATIVE NG/ML

## 2011-02-13 ENCOUNTER — Inpatient Hospital Stay (HOSPITAL_COMMUNITY): Payer: Medicaid Other

## 2011-02-13 DIAGNOSIS — J13 Pneumonia due to Streptococcus pneumoniae: Secondary | ICD-10-CM

## 2011-02-13 DIAGNOSIS — J438 Other emphysema: Secondary | ICD-10-CM

## 2011-02-13 DIAGNOSIS — J9620 Acute and chronic respiratory failure, unspecified whether with hypoxia or hypercapnia: Secondary | ICD-10-CM

## 2011-02-13 LAB — POCT I-STAT 3, ART BLOOD GAS (G3+)
Acid-Base Excess: 8 mmol/L — ABNORMAL HIGH (ref 0.0–2.0)
Bicarbonate: 33 mEq/L — ABNORMAL HIGH (ref 20.0–24.0)
O2 Saturation: 94 %
TCO2: 34 mmol/L (ref 0–100)
pCO2 arterial: 45.5 mmHg — ABNORMAL HIGH (ref 35.0–45.0)
pH, Arterial: 7.469 — ABNORMAL HIGH (ref 7.350–7.400)
pO2, Arterial: 69 mmHg — ABNORMAL LOW (ref 80.0–100.0)

## 2011-02-13 LAB — BASIC METABOLIC PANEL
BUN: 7 mg/dL (ref 6–23)
CO2: 30 mEq/L (ref 19–32)
Calcium: 9.1 mg/dL (ref 8.4–10.5)
Chloride: 102 mEq/L (ref 96–112)
Creatinine, Ser: 0.33 mg/dL — ABNORMAL LOW (ref 0.50–1.10)
GFR calc Af Amer: >90 mL/min (ref >90)
GFR calc non Af Amer: >90 mL/min (ref >90)
Glucose, Bld: 99 mg/dL (ref 70–99)
Potassium: 3.7 mEq/L (ref 3.5–5.1)
Sodium: 141 mEq/L (ref 135–145)

## 2011-02-13 LAB — MRSA PCR SCREENING: MRSA by PCR: NEGATIVE

## 2011-02-13 LAB — CBC
HCT: 38 % (ref 36.0–46.0)
Hemoglobin: 12.8 g/dL (ref 12.0–15.0)
MCH: 33.7 pg (ref 26.0–34.0)
MCHC: 33.7 g/dL (ref 30.0–36.0)
MCV: 100 fL (ref 78.0–100.0)
Platelets: 144 10*3/uL — ABNORMAL LOW (ref 150–400)
RBC: 3.8 MIL/uL — ABNORMAL LOW (ref 3.87–5.11)
RDW: 13 % (ref 11.5–15.5)
WBC: 6.4 10*3/uL (ref 4.0–10.5)

## 2011-02-13 LAB — LACTIC ACID, PLASMA: Lactic Acid, Venous: 1.2 mmol/L (ref 0.5–2.2)

## 2011-02-13 LAB — GLUCOSE, CAPILLARY: Glucose-Capillary: 116 mg/dL — ABNORMAL HIGH (ref 70–99)

## 2011-02-14 ENCOUNTER — Inpatient Hospital Stay (HOSPITAL_COMMUNITY): Payer: Medicaid Other

## 2011-02-14 ENCOUNTER — Encounter (HOSPITAL_COMMUNITY): Payer: Self-pay | Admitting: Radiology

## 2011-02-14 DIAGNOSIS — J9600 Acute respiratory failure, unspecified whether with hypoxia or hypercapnia: Secondary | ICD-10-CM

## 2011-02-14 LAB — GLUCOSE, CAPILLARY
Glucose-Capillary: 106 mg/dL — ABNORMAL HIGH (ref 70–99)
Glucose-Capillary: 122 mg/dL — ABNORMAL HIGH (ref 70–99)

## 2011-02-14 LAB — ALBUMIN: Albumin: 3.3 g/dL — ABNORMAL LOW (ref 3.5–5.2)

## 2011-02-14 LAB — BASIC METABOLIC PANEL
BUN: 11 mg/dL (ref 6–23)
CO2: 30 mEq/L (ref 19–32)
Calcium: 9.4 mg/dL (ref 8.4–10.5)
Chloride: 104 mEq/L (ref 96–112)
Creatinine, Ser: 0.38 mg/dL — ABNORMAL LOW (ref 0.50–1.10)
GFR calc Af Amer: >90 mL/min (ref >90)
GFR calc non Af Amer: >90 mL/min (ref >90)
Glucose, Bld: 91 mg/dL (ref 70–99)
Potassium: 3.9 mEq/L (ref 3.5–5.1)
Sodium: 141 mEq/L (ref 135–145)

## 2011-02-14 LAB — PHENYTOIN LEVEL, TOTAL: Phenytoin Lvl: 3.1 ug/mL — ABNORMAL LOW (ref 10.0–20.0)

## 2011-02-14 LAB — PROTIME-INR
INR: 0.99 (ref 0.00–1.49)
Prothrombin Time: 13.3 seconds (ref 11.6–15.2)

## 2011-02-14 LAB — CARDIAC PANEL(CRET KIN+CKTOT+MB+TROPI)
CK, MB: 3.7 ng/mL (ref 0.3–4.0)
Relative Index: INVALID (ref 0.0–2.5)
Total CK: 58 U/L (ref 7–177)
Troponin I: <0.3 ng/mL (ref <0.30)

## 2011-02-14 LAB — APTT: aPTT: 24 seconds (ref 24–37)

## 2011-02-15 LAB — PHENYTOIN LEVEL, TOTAL: Phenytoin Lvl: 14.4 ug/mL (ref 10.0–20.0)

## 2011-02-16 LAB — BASIC METABOLIC PANEL
BUN: 15 mg/dL (ref 6–23)
CO2: 32 mEq/L (ref 19–32)
Calcium: 9.5 mg/dL (ref 8.4–10.5)
Chloride: 98 mEq/L (ref 96–112)
Creatinine, Ser: 0.39 mg/dL — ABNORMAL LOW (ref 0.50–1.10)
GFR calc Af Amer: >90 mL/min (ref >90)
GFR calc non Af Amer: >90 mL/min (ref >90)
Glucose, Bld: 88 mg/dL (ref 70–99)
Potassium: 4.4 mEq/L (ref 3.5–5.1)
Sodium: 139 mEq/L (ref 135–145)

## 2011-02-16 LAB — CBC
HCT: 42.3 % (ref 36.0–46.0)
Hemoglobin: 14.3 g/dL (ref 12.0–15.0)
MCH: 33.5 pg (ref 26.0–34.0)
MCHC: 33.8 g/dL (ref 30.0–36.0)
MCV: 99.1 fL (ref 78.0–100.0)
Platelets: 213 10*3/uL (ref 150–400)
RBC: 4.27 MIL/uL (ref 3.87–5.11)
RDW: 13.1 % (ref 11.5–15.5)
WBC: 13.6 10*3/uL — ABNORMAL HIGH (ref 4.0–10.5)

## 2011-02-17 NOTE — Discharge Summary (Signed)
[place], Wanda Prince NO.:  [id number]  MEDICAL RECORD NO.:  [id number]  LOCATION:  5148                         FACILITY:  [facility]  PHYSICIAN:  Andreas Blower, MD       DATE OF BIRTH:  07/14/1955  DATE OF ADMISSION:  02/12/2011 DATE OF DISCHARGE:  02/17/2011                              DISCHARGE SUMMARY   PRIMARY CARE PHYSICIAN:  The patient does not have one.  The patient is scheduled to see [facility].  DISCHARGE DIAGNOSES: 1. Chronic obstructive pulmonary disease exacerbation. 2. Acute on chronic hypoxic/hypercarbic respiratory failure secondary     to chronic obstructive pulmonary disease exacerbation. 3. Seizure disorder, had an episode due to subtherapeutic Dilantin     level. 4. Tobacco use. 5. Alcohol abuse. 6. Hypertension. 7. 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 8. History of suicide attempt in the past. 9. History of polysubstance abuse with cocaine, tobacco, alcoholabuse. 10.History of major depressive disorder. 11.History of alcoholic hepatitis.  DISCHARGE MEDICATIONS: 1. Albuterol inhaler 1-2 puffs every 6 hours as needed for shortness     of breath. 2. Albuterol 2.5 mg inhaled 3 times a day as needed for shortness of     breath. 3. Folic acid 1 mg p.o. daily. 4. Ipratropium 0.5 mg inhaled 3 times a day as needed for shortness of     breath. 5. Nicotine patch 21 mg per 24 hours transdermal patch daily.  The     patient was instructed not to smoke while on the patch. 6. Prednisone taper 60 mg p.o. daily for 1 day, then 40 mg p.o. daily     for 2 days, then 20 mg p.o. daily for 2 days, then 10 mg p.o. daily     for 2 days, then 5 mg p.o. daily for 2 days, then discontinue. 7. Spiriva 18 mcg 1 capsule inhaled daily. 8. Thiamine 100 mg p.o. daily. 9. Advair Diskus 250/50, 1 puff inhaled twice daily. 10.Benazepril 20 mg p.o. daily. 11.Colace 100 mg p.o. 3 times a day. 12.Dilantin extended release 200 mg p.o.  twice daily. 13.Hydrochlorothiazide 12.5 mg p.o. daily. 14.Ibuprofen 600 mg p.o. 3 times a day as needed. 15.Vitamin D over-the-counter 1 tablet p.o. daily.  BRIEF ADMITTING HISTORY AND PHYSICAL:  Wanda Prince is a 55 year old Caucasian female with history of chronic respiratory failure due to COPD presented with an exacerbation on February 13, 2011.  The patient initially was admitted on October 12 but left AMA on October 15 and re- presented on October 17.  RADIOLOGY/IMAGING: 1. The patient had multiple chest x-rays, most recently on February 13, 2011 which showed hyperaeration consistent with COPD.  No active     lung disease. 2. The patient had a CT of the head without contrast on February 14, 2011 which showed stable appearance of intracranial contents since     previous study.  Postoperative changes in the right frontotemporal     region with right transfrontal ventricular shunt. 3. The patient had a 2D echocardiogram with bubble study.  Left     ventricular cavity size was normal.  Wall thickness was  normal.     Ejection fraction 55%.  Wall motion was normal.  There were no     regional wall motion abnormalities.  No evidence of shunt.  LABORATORY DATA:  CBC shows a white count of 15.6, hemoglobin 14.3, hematocrit 42.0, platelet count 215,000.  Electrolytes normal with a BUN of 15, creatinine of 0.30.  MRSA by PCR was negative.  Blood cultures showed no growth to date.  Troponin was less than 0.30.  Venous lactic acid was 1.2.  HOSPITAL COURSE BY PROBLEM: 1. Acute on chronic hypoxic/hypercarbic respiratory failure due to     acute COPD exacerbation.  The patient initially was admitted to the     Pulmonary Critical Care Service.  The patient was placed on non-     rebreather.  Initially on admission, the patient was started     aggressively on steroids.  She was also started on levofloxacin.     During the course of hospital stay as her breathing improved, the      patient's levofloxacin was discontinued.  The patient's steroids     were transitioned to oral.  As patient's breathing continued to     improve, the patient was transitioned to the Hospitalist Service.     The patient had a walking desaturation done on February 17, 2011     prior to discharge, which showed that the patient walked     approximately 300 feet on room air.  The patient's oxygenation was     between 94-97%.  The patient did not qualify for oxygen.  The     patient will continue steroid taper at discharge.  The patient was     placed on Spiriva to be started after completing steroids.  The     patient was also placed on Advair Diskus.  The patient was given     the telephone number for [facility] to call to schedule a     followup appointment at discharge. 2. Seizure disorder.  During the course of hospital stay on February 14, 2011, the patient had 1 episode of seizure.  Code stroke was     called.  It was thought that the patient had an episode of seizure     due to subtherapeutic Dilantin level.  Neurology did not recommend     any further workup.  The patient's Dilantin dosage was increased to     home dose of 200 mg twice daily extended release.  Subsequently     since dose correction was made, the patient was seizure free. 3. Tobacco use.  Had a long discussion with the patient about     smoking cessation.  The patient indicates that she is motivated to     quit smoking and requested nicotine patches at the time of     discharge, which will be arranged as prescription for the patient. 4. Hypertension, stable.  Continue the patient on home medications. 5. Alcohol abuse.  Encouraged cessation.  We will give the patient     thiamine and folate at the time of discharge.  DISPOSITION AND FOLLOWUP:  The patient to follow up with [facility] for lab appointment on October 26 at 10:15 a.m.  The patient has an appointment with [facility] on December 12th at 10:30  a.m.  The patient was instructed to call [facility] office and make followup appointment.  Time spent on discharge talking to the patient, coordinating care was 35 minutes.  Andreas Blower, MD   SR/MEDQ  D:  02/17/2011  T:  02/17/2011  Job:  [id number]  Electronically Signed by Wardell Heath Ronda Kazmi  on 02/17/2011 05:15:15 PM

## 2011-02-17 NOTE — Consult Note (Signed)
  NAMEAARIANA, SHANKLAND NO.:  [id number]  MEDICAL RECORD NO.:  [id number]  LOCATION:                                 FACILITY:  PHYSICIAN:  Thana Farr, MD    DATE OF BIRTH:  [birth year]  DATE OF CONSULTATION:  02/14/2011 DATE OF DISCHARGE:                                CONSULTATION   CONSULT CALLED BY:  PCCM.  HISTORY:  Ms. Happe is a 55 year old female that was admitted with a COPD exacerbation.  Noted at 5:30 this morning to have left facial droop and difficulty with speech.  Symptoms resolved spontaneously.  The patient does have a history of seizures.  Reports that this is typical for her seizures.  The patient had a subtherapeutic Dilantin level of 3.1.  PAST MEDICAL HISTORY: 1. COPD. 2. Seizure disorder. 3. 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  MEDICATIONS:  Atrovent, Ventolin, Dilantin, Toradol, Pepcid, Lovenox, prednisone, NicoDerm, vitamin B1, folic acid, multivitamin, Levaquin.  SOCIAL HISTORY:  The patient smokes.  PHYSICAL EXAMINATION:  VITAL SIGNS:  Blood pressure 136/76, heart rate 94, respiratory rate 22, T-max 98.3. NEUROLOGIC:  On mental status testing, the patient is alert and oriented.  She can follow commands without difficulty.  Speech is fluent.  On cranial nerve testing:  II - visual fields full.  III, IV, VI - extraocular movements intact.  V and VII - smile symmetric.  VIII grossly intact.  IX and X - positive gag.  XI - bilateral shoulder shrug, and XII - tongue deviation to the right.  On motor testing, the patient is 5/5 throughout.  There is normal tone and bulk.  On sensory testing, pinprick, and light touch intact bilaterally.  Deep tendon reflexes are 2+ in the upper extremities, 1+ at the knees, and absent at the ankles.  Plantars are downgoing bilaterally.  On cerebellar testing, finger-to-nose is intact.  There is dysmetria with heel-to-shin bilaterally.  LABORATORY DATA:  Sodium of 141, potassium  3.9, chloride 104, bicarb 30, BUN 11, creatinine 0.38, glucose 91, calcium 9.1, albumin 3.3.  Dilantin 3.1.  A head CT shows some encephalomalacia in the right frontal lobe with evidence of a VP shunt placement.  There was also some noted atrophy. No acute changes are noted.  ASSESSMENT:  Ms. Bergevin is a 55 year old female with seizure, on subtherapeutic Dilantin.  She has a history of seizures.  This event she reports is quite typical for her.  I do not suspect an ischemic event despite the fact that a code stroke was called.  I do not recommend further workup for stroke.  PLAN: 1. Agree with augmentation of Dilantin. 2. Dilantin level in the morning. 3. Seizure precautions. 4. I do not feel any further neurologic workup is recommended at this     time.  Please page with any further questions.          ______________________________ Thana Farr, MD     LR/MEDQ  D:  02/14/2011  T:  02/15/2011  Job:  [id number]  Electronically Signed by Thana Farr MD on 02/17/2011 10:31:25 AM

## 2011-02-19 ENCOUNTER — Encounter: Payer: Self-pay | Admitting: Internal Medicine

## 2011-02-19 LAB — CULTURE, BLOOD (ROUTINE X 2)
Culture  Setup Time: 201210170334
Culture  Setup Time: 201210170335
Culture: NO GROWTH
Culture: NO GROWTH

## 2011-02-20 ENCOUNTER — Institutional Professional Consult (permissible substitution): Payer: Medicaid Other | Admitting: Internal Medicine

## 2011-02-26 NOTE — Discharge Summary (Signed)
NAMEBEXLEIGH, THERIAULT                 ACCOUNT NO.:  [id number]  MEDICAL RECORD NO.:  [id number]  LOCATION:  5148                         FACILITY:  [facility]  PHYSICIAN:  Felipa Evener, MD  DATE OF BIRTH:  1955-07-10  DATE OF ADMISSION:  02/08/2011 DATE OF DISCHARGE:  02/11/2011                              DISCHARGE SUMMARY   DISCHARGE DIAGNOSIS:  Chronic obstructive pulmonary disease with exacerbation.  SECONDARY DIAGNOSES: 1. Respiratory failure. 2. Seizure disorder. 3. Hypokalemia and anxiety.  HISTORY OF HOSPITAL COURSE:  Ms. Hust is a 55 year old white female with a past medical history of COPD, who was previously followed by Dr. Darrol Angel at [facility] in 2007, seizure disorder, AVM repair, alcoholic hepatitis, polysubstance abuse, suicide attempt, VP shunt for subependymal abscess in 2007, who was admitted on February 07, 2011, with acute on chronic respiratory failure, increasing anxiety with attempts at BiPAP and ultimately required intubation.  She was maintained on mechanical ventilation from February 07, 2011, until February 08, 2011, and was subsequently successfully extubated.  During hospital course, urine strep was positive, Legionella was negative.  She was placed on Levaquin.  Postextubation, she did require intermittent BiPAP for support, however, on the day of discharge, Ms. Golob was noted to be in the bathroom, smoking cigarettes and after was told that she could not smoke on campus, became belligerent and left AMA.  She was informed of risk to include death and still chose to leave against medical advice.  BRIEF DISCHARGE INSTRUCTIONS:  No prescriptions or discharge instructions were given at this time as Ms. Wyman unfortunately chose to leave AMA.  DISPOSITION:  At the time of discharge, Ms. Coryell was not cleared for discharge and again was stressed with the patient that she should not leave against medical advice out of concern for her respiratory  status, however, she did choose to leave.  TIME SPENT ON DISPOSITION:  35 minutes.     Felipa Evener, MD     WJY/MEDQ  D:  02/20/2011  T:  02/20/2011  Job:  [id number]

## 2011-03-26 ENCOUNTER — Institutional Professional Consult (permissible substitution): Payer: Medicaid Other | Admitting: Internal Medicine

## 2011-04-25 ENCOUNTER — Emergency Department (HOSPITAL_COMMUNITY)
Admission: EM | Admit: 2011-04-25 | Discharge: 2011-04-25 | Disposition: A | Discharge status: Home / Self Care | Payer: Medicaid Other | Source: Home / Self Care | Attending: Emergency Medicine | Admitting: Emergency Medicine

## 2011-04-25 ENCOUNTER — Encounter (HOSPITAL_COMMUNITY): Payer: Self-pay | Admitting: Emergency Medicine

## 2011-04-25 DIAGNOSIS — L989 Disorder of the skin and subcutaneous tissue, unspecified: Secondary | ICD-10-CM

## 2011-04-25 NOTE — ED Provider Notes (Signed)
History     CSN: [id number]  Arrival date & time 04/25/11  1412   First MD Initiated Contact with Patient 04/25/11 1553      Chief Complaint  Patient presents with  . Mass    (Consider location/radiation/quality/duration/timing/severity/associated sxs/prior treatment) HPI Comments: Wanda Prince has had a 2 month history of a lump on the lateral aspect of the nose near the medial canthus of the left eye. This is been getting larger in size. It's felt irritated and seems to interfere with her vision. It has not been red or draining and she's had no fever or chills. She was seen for this at [facility] and referred here for further evaluation and treatment.   Past Medical History  Diagnosis Date  . Meningitis 2009  . Hx of ventricular shunt 2009  . Seizure disorder   . 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 Suicide attempt     History of suicide attempts August 2006 and July 2006  . History of alcohol abuse   . History of cocaine abuse   . Major depressive disorder     History of  . History of iron deficiency   . COPD (chronic obstructive pulmonary disease)   . History of acute alcoholic hepatitis   . Psychiatric diagnosis     Multiple  . 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 Craniotomy     History reviewed. No pertinent family history.  History  Substance Use Topics  . Smoking status: Current Everyday Smoker  . Smokeless tobacco: Not on file  . Alcohol Use: Yes    OB History    Grav Para Term Preterm Abortions TAB SAB Ect Mult Living                  Review of Systems  Constitutional: Negative for fever and chills.  Skin: Negative for color change, pallor, rash and wound.    Allergies  Review of patient's allergies indicates no known allergies.  Home  Medications   Current Outpatient Rx  Name Route Sig Dispense Refill  . ALBUTEROL SULFATE (2.5 MG/3ML) 0.083% IN NEBU Nebulization Take 2.5 mg by nebulization 3 (three) times daily as needed.      Marland Kitchen BENAZEPRIL HCL 20 MG PO TABS Oral Take 20 mg by mouth daily.      Marland Kitchen DOCUSATE SODIUM 100 MG PO CAPS Oral Take 100 mg by mouth 3 (three) times daily as needed.      Marland Kitchen FLUTICASONE-SALMETEROL 250-50 MCG/DOSE IN AEPB Inhalation Inhale 1 puff into the lungs every 12 (twelve) hours.      Marland Kitchen FOLIC ACID 1 MG PO TABS Oral Take 1 mg by mouth daily.      Marland Kitchen HYDROCHLOROTHIAZIDE 12.5 MG PO CAPS Oral Take 12.5 mg by mouth daily.      . IBUPROFEN 600 MG PO TABS Oral Take 600 mg by mouth every 6 (six) hours as needed.      . IPRATROPIUM-ALBUTEROL 0.5-2.5 (3) MG/3ML IN SOLN Nebulization Take 3 mLs by nebulization 3 (three) times daily as needed.      Marland Kitchen NICOTINE 21 MG/24HR TD PT24 Transdermal Place 1 patch onto the skin daily.      Marland Kitchen PHENYTOIN SODIUM EXTENDED 200 MG PO CAPS Oral Take 200 mg by mouth  2 (two) times daily.      Marland Kitchen PREDNISONE (PAK) 10 MG PO TABS Oral Take 10 mg by mouth daily. Taper as directed     . THIAMINE HCL 100 MG PO TABS Oral Take 100 mg by mouth daily.      Marland Kitchen TIOTROPIUM BROMIDE MONOHYDRATE 18 MCG IN CAPS Inhalation Place 18 mcg into inhaler and inhale daily.      . VENTOLIN HFA 108 (90 BASE) MCG/ACT IN AERS  USE 1 PUFF EVERY SIX HOURS AS NEEDED 1 Inhaler 11  . VITAMIN D (ERGOCALCIFEROL) PO Oral Take 1 tablet by mouth daily.        BP 109/59  Pulse 83  Temp(Src) 98.2 F (36.8 C) (Oral)  Resp 16  SpO2 96%  Physical Exam  Nursing note and vitals reviewed. Constitutional: She appears well-developed and well-nourished. No distress.  Skin: Skin is warm and dry. No abrasion, no bruising, no ecchymosis, no lesion and no rash noted. She is not diaphoretic. No erythema. No pallor.       There is a 1 cm radius, tan, nontender papule at the medial canthus of the left eye.    ED Course  Procedures  (including critical care time)  Labs Reviewed - No data to display No results found.   1. Benign skin lesion of face       MDM  This has the appearance of a benign skin lesion, possibly a nevus or a cyst. It needs to be excised, but it is not the appropriate place to do it here and I referred her to an eye doctor for excision.        Roque Lias, MD 04/25/11 [phone]

## 2011-04-25 NOTE — ED Notes (Signed)
Reports bump near inner corner of left eye.  , onset 2 months, has increased in size over the past 2 months.  Now feels like something in eye

## 2011-05-09 ENCOUNTER — Telehealth (HOSPITAL_COMMUNITY): Payer: Self-pay | Admitting: *Deleted

## 2011-05-09 NOTE — ED Notes (Signed)
12/27 Medical follow-up request form received from Dr. Lorenz Coaster for pt. to seen Dr. Delaney Meigs for lump on lat. aspect of the nose near the medial canthus of the L eye. It has been getting larger.  Pt. is Medicaid [place] [facility]. I talked to Dr. Delaney Meigs and he said he does not remove lumps or bumps and refers those patients to Dr. Dimas Millin @ [phone]. His office is in [place] but goes to [place] on Mondays. 12/31 Dr. Vella Raring office closed.  1/2 I called Dr. Candiss Norse office and they are not sure if they accept Medicaid. She said she will call me back. 1/10 I called Dr. Candiss Norse office again and she scheduled pt. for 2/18 @ 1000 with Dr. Dimas Millin at the [place] office.  She will call pt.  I faxed the record to [phone] and confirmation received. I tried to call pt. but no answer. Wanda Prince 05/09/2011

## 2011-05-31 HISTORY — PX: TRANSTHORACIC ECHOCARDIOGRAM: SHX275

## 2011-06-03 ENCOUNTER — Inpatient Hospital Stay (HOSPITAL_COMMUNITY)
Admission: EM | Admit: 2011-06-03 | Discharge: 2011-06-07 | DRG: 100 | Disposition: A | Discharge status: Home / Self Care | Payer: Medicaid Other | Attending: Internal Medicine | Admitting: Internal Medicine

## 2011-06-03 ENCOUNTER — Emergency Department (HOSPITAL_COMMUNITY): Payer: Medicaid Other

## 2011-06-03 ENCOUNTER — Other Ambulatory Visit: Payer: Self-pay

## 2011-06-03 ENCOUNTER — Inpatient Hospital Stay (HOSPITAL_COMMUNITY): Payer: Medicaid Other

## 2011-06-03 ENCOUNTER — Encounter (HOSPITAL_COMMUNITY)
Admission: EM | Disposition: A | Discharge status: Home / Self Care | Payer: Self-pay | Source: Home / Self Care | Attending: Internal Medicine

## 2011-06-03 ENCOUNTER — Encounter (HOSPITAL_COMMUNITY): Payer: Self-pay

## 2011-06-03 DIAGNOSIS — J441 Chronic obstructive pulmonary disease with (acute) exacerbation: Secondary | ICD-10-CM | POA: Diagnosis present

## 2011-06-03 DIAGNOSIS — R339 Retention of urine, unspecified: Secondary | ICD-10-CM | POA: Diagnosis not present

## 2011-06-03 DIAGNOSIS — F1010 Alcohol abuse, uncomplicated: Secondary | ICD-10-CM

## 2011-06-03 DIAGNOSIS — G060 Intracranial abscess and granuloma: Secondary | ICD-10-CM | POA: Insufficient documentation

## 2011-06-03 DIAGNOSIS — I213 ST elevation (STEMI) myocardial infarction of unspecified site: Secondary | ICD-10-CM

## 2011-06-03 DIAGNOSIS — J438 Other emphysema: Secondary | ICD-10-CM | POA: Diagnosis present

## 2011-06-03 DIAGNOSIS — I629 Nontraumatic intracranial hemorrhage, unspecified: Secondary | ICD-10-CM | POA: Insufficient documentation

## 2011-06-03 DIAGNOSIS — R0989 Other specified symptoms and signs involving the circulatory and respiratory systems: Secondary | ICD-10-CM | POA: Diagnosis present

## 2011-06-03 DIAGNOSIS — E119 Type 2 diabetes mellitus without complications: Secondary | ICD-10-CM | POA: Diagnosis present

## 2011-06-03 DIAGNOSIS — G40409 Other generalized epilepsy and epileptic syndromes, not intractable, without status epilepticus: Secondary | ICD-10-CM | POA: Diagnosis present

## 2011-06-03 DIAGNOSIS — K7010 Alcoholic hepatitis without ascites: Secondary | ICD-10-CM | POA: Diagnosis present

## 2011-06-03 DIAGNOSIS — G8389 Other specified paralytic syndromes: Secondary | ICD-10-CM | POA: Diagnosis present

## 2011-06-03 DIAGNOSIS — I428 Other cardiomyopathies: Secondary | ICD-10-CM | POA: Diagnosis present

## 2011-06-03 DIAGNOSIS — Z9151 Personal history of suicidal behavior: Secondary | ICD-10-CM | POA: Insufficient documentation

## 2011-06-03 DIAGNOSIS — G8384 Todd's paralysis (postepileptic): Secondary | ICD-10-CM

## 2011-06-03 DIAGNOSIS — I214 Non-ST elevation (NSTEMI) myocardial infarction: Secondary | ICD-10-CM | POA: Diagnosis present

## 2011-06-03 DIAGNOSIS — R4182 Altered mental status, unspecified: Secondary | ICD-10-CM | POA: Diagnosis present

## 2011-06-03 DIAGNOSIS — J449 Chronic obstructive pulmonary disease, unspecified: Secondary | ICD-10-CM | POA: Diagnosis present

## 2011-06-03 DIAGNOSIS — F17210 Nicotine dependence, cigarettes, uncomplicated: Secondary | ICD-10-CM | POA: Diagnosis present

## 2011-06-03 DIAGNOSIS — G40909 Epilepsy, unspecified, not intractable, without status epilepticus: Principal | ICD-10-CM | POA: Diagnosis present

## 2011-06-03 DIAGNOSIS — G459 Transient cerebral ischemic attack, unspecified: Secondary | ICD-10-CM | POA: Diagnosis present

## 2011-06-03 DIAGNOSIS — G40309 Generalized idiopathic epilepsy and epileptic syndromes, not intractable, without status epilepticus: Secondary | ICD-10-CM

## 2011-06-03 DIAGNOSIS — I10 Essential (primary) hypertension: Secondary | ICD-10-CM | POA: Diagnosis present

## 2011-06-03 DIAGNOSIS — R0609 Other forms of dyspnea: Secondary | ICD-10-CM | POA: Diagnosis present

## 2011-06-03 DIAGNOSIS — Z7982 Long term (current) use of aspirin: Secondary | ICD-10-CM

## 2011-06-03 DIAGNOSIS — F17200 Nicotine dependence, unspecified, uncomplicated: Secondary | ICD-10-CM | POA: Diagnosis present

## 2011-06-03 DIAGNOSIS — G9340 Encephalopathy, unspecified: Secondary | ICD-10-CM

## 2011-06-03 DIAGNOSIS — I2510 Atherosclerotic heart disease of native coronary artery without angina pectoris: Secondary | ICD-10-CM | POA: Diagnosis present

## 2011-06-03 DIAGNOSIS — I2109 ST elevation (STEMI) myocardial infarction involving other coronary artery of anterior wall: Secondary | ICD-10-CM | POA: Diagnosis present

## 2011-06-03 DIAGNOSIS — F10931 Alcohol use, unspecified with withdrawal delirium: Secondary | ICD-10-CM | POA: Diagnosis present

## 2011-06-03 DIAGNOSIS — D45 Polycythemia vera: Secondary | ICD-10-CM | POA: Diagnosis present

## 2011-06-03 DIAGNOSIS — R569 Unspecified convulsions: Secondary | ICD-10-CM

## 2011-06-03 DIAGNOSIS — F10231 Alcohol dependence with withdrawal delirium: Secondary | ICD-10-CM | POA: Diagnosis present

## 2011-06-03 DIAGNOSIS — Z681 Body mass index (BMI) 19 or less, adult: Secondary | ICD-10-CM

## 2011-06-03 DIAGNOSIS — I429 Cardiomyopathy, unspecified: Secondary | ICD-10-CM | POA: Diagnosis present

## 2011-06-03 DIAGNOSIS — Z8661 Personal history of infections of the central nervous system: Secondary | ICD-10-CM

## 2011-06-03 DIAGNOSIS — Z720 Tobacco use: Secondary | ICD-10-CM

## 2011-06-03 DIAGNOSIS — Z87891 Personal history of nicotine dependence: Secondary | ICD-10-CM | POA: Diagnosis present

## 2011-06-03 DIAGNOSIS — Z915 Personal history of self-harm: Secondary | ICD-10-CM | POA: Insufficient documentation

## 2011-06-03 DIAGNOSIS — F1410 Cocaine abuse, uncomplicated: Secondary | ICD-10-CM | POA: Diagnosis present

## 2011-06-03 HISTORY — DX: Unspecified lump in the left breast, unspecified quadrant: N63.20

## 2011-06-03 LAB — COMPREHENSIVE METABOLIC PANEL
ALT: 90 U/L — ABNORMAL HIGH (ref 0–35)
AST: 111 U/L — ABNORMAL HIGH (ref 0–37)
Albumin: 3.6 g/dL (ref 3.5–5.2)
Alkaline Phosphatase: 163 U/L — ABNORMAL HIGH (ref 39–117)
BUN: 17 mg/dL (ref 6–23)
CO2: 27 mEq/L (ref 19–32)
Calcium: 9 mg/dL (ref 8.4–10.5)
Chloride: 95 mEq/L — ABNORMAL LOW (ref 96–112)
Creatinine, Ser: 0.6 mg/dL (ref 0.50–1.10)
GFR calc Af Amer: >90 mL/min (ref >90)
GFR calc non Af Amer: >90 mL/min (ref >90)
Glucose, Bld: 272 mg/dL — ABNORMAL HIGH (ref 70–99)
Potassium: 4 mEq/L (ref 3.5–5.1)
Sodium: 136 mEq/L (ref 135–145)
Total Bilirubin: 0.3 mg/dL (ref 0.3–1.2)
Total Protein: 7.5 g/dL (ref 6.0–8.3)

## 2011-06-03 LAB — DIFFERENTIAL
Basophils Absolute: 0 10*3/uL (ref 0.0–0.1)
Basophils Relative: 0 % (ref 0–1)
Eosinophils Absolute: 0.1 10*3/uL (ref 0.0–0.7)
Eosinophils Relative: 1 % (ref 0–5)
Lymphocytes Relative: 27 % (ref 12–46)
Lymphs Abs: 2.6 10*3/uL (ref 0.7–4.0)
Monocytes Absolute: 0.8 10*3/uL (ref 0.1–1.0)
Monocytes Relative: 9 % (ref 3–12)
Neutro Abs: 5.9 10*3/uL (ref 1.7–7.7)
Neutrophils Relative %: 62 % (ref 43–77)

## 2011-06-03 LAB — CARDIAC PANEL(CRET KIN+CKTOT+MB+TROPI)
CK, MB: 17.5 ng/mL (ref 0.3–4.0)
CK, MB: 18.7 ng/mL (ref 0.3–4.0)
Relative Index: 6.8 — ABNORMAL HIGH (ref 0.0–2.5)
Relative Index: 6.4 — ABNORMAL HIGH (ref 0.0–2.5)
Total CK: 258 U/L — ABNORMAL HIGH (ref 7–177)
Total CK: 292 U/L — ABNORMAL HIGH (ref 7–177)
Troponin I: 4.12 ng/mL (ref <0.30)
Troponin I: 2.45 ng/mL (ref <0.30)

## 2011-06-03 LAB — BASIC METABOLIC PANEL
BUN: 16 mg/dL (ref 6–23)
CO2: 28 mEq/L (ref 19–32)
Calcium: 8.8 mg/dL (ref 8.4–10.5)
Chloride: 102 mEq/L (ref 96–112)
Creatinine, Ser: 0.45 mg/dL — ABNORMAL LOW (ref 0.50–1.10)
GFR calc Af Amer: >90 mL/min (ref >90)
GFR calc non Af Amer: >90 mL/min (ref >90)
Glucose, Bld: 107 mg/dL — ABNORMAL HIGH (ref 70–99)
Potassium: 4.2 mEq/L (ref 3.5–5.1)
Sodium: 139 mEq/L (ref 135–145)

## 2011-06-03 LAB — URINALYSIS, MICROSCOPIC ONLY
Glucose, UA: NEGATIVE mg/dL
Hgb urine dipstick: NEGATIVE
Ketones, ur: NEGATIVE mg/dL
Leukocytes, UA: NEGATIVE
Nitrite: NEGATIVE
Protein, ur: 30 mg/dL — AB
Specific Gravity, Urine: 1.026 (ref 1.005–1.030)
Urobilinogen, UA: 1 mg/dL (ref 0.0–1.0)
pH: 6 (ref 5.0–8.0)

## 2011-06-03 LAB — RAPID URINE DRUG SCREEN, HOSP PERFORMED
Amphetamines: NOT DETECTED
Barbiturates: NOT DETECTED
Benzodiazepines: POSITIVE — AB
Cocaine: NOT DETECTED
Opiates: NOT DETECTED
Tetrahydrocannabinol: NOT DETECTED

## 2011-06-03 LAB — POCT I-STAT, CHEM 8
BUN: 19 mg/dL (ref 6–23)
Calcium, Ion: 1.13 mmol/L (ref 1.12–1.32)
Chloride: 100 mEq/L (ref 96–112)
Creatinine, Ser: 0.8 mg/dL (ref 0.50–1.10)
Glucose, Bld: 259 mg/dL — ABNORMAL HIGH (ref 70–99)
HCT: 52 % — ABNORMAL HIGH (ref 36.0–46.0)
Hemoglobin: 17.7 g/dL — ABNORMAL HIGH (ref 12.0–15.0)
Potassium: 4.4 mEq/L (ref 3.5–5.1)
Sodium: 137 mEq/L (ref 135–145)
TCO2: 30 mmol/L (ref 0–100)

## 2011-06-03 LAB — CBC
HCT: 45.5 % (ref 36.0–46.0)
Hemoglobin: 15.3 g/dL — ABNORMAL HIGH (ref 12.0–15.0)
MCH: 34.1 pg — ABNORMAL HIGH (ref 26.0–34.0)
MCHC: 33.6 g/dL (ref 30.0–36.0)
MCV: 101.3 fL — ABNORMAL HIGH (ref 78.0–100.0)
Platelets: 217 10*3/uL (ref 150–400)
RBC: 4.49 MIL/uL (ref 3.87–5.11)
RDW: 13.3 % (ref 11.5–15.5)
WBC: 9.5 10*3/uL (ref 4.0–10.5)

## 2011-06-03 LAB — BLOOD GAS, ARTERIAL
Acid-Base Excess: 3.9 mmol/L — ABNORMAL HIGH (ref 0.0–2.0)
Bicarbonate: 28.8 mEq/L — ABNORMAL HIGH (ref 20.0–24.0)
Drawn by: 347641
O2 Content: 2 L/min
O2 Saturation: 91.9 %
Patient temperature: 98.6
TCO2: 30.4 mmol/L (ref 0–100)
pCO2 arterial: 51.2 mmHg — ABNORMAL HIGH (ref 35.0–45.0)
pH, Arterial: 7.368 (ref 7.350–7.400)
pO2, Arterial: 64.4 mmHg — ABNORMAL LOW (ref 80.0–100.0)

## 2011-06-03 LAB — PROTIME-INR
INR: 1.17 (ref 0.00–1.49)
Prothrombin Time: 15.1 seconds (ref 11.6–15.2)

## 2011-06-03 LAB — PHENYTOIN LEVEL, TOTAL: Phenytoin Lvl: 13.6 ug/mL (ref 10.0–20.0)

## 2011-06-03 LAB — MRSA PCR SCREENING: MRSA by PCR: NEGATIVE

## 2011-06-03 LAB — CK TOTAL AND CKMB (NOT AT ARMC)
CK, MB: 4.2 ng/mL — ABNORMAL HIGH (ref 0.3–4.0)
Relative Index: 3 — ABNORMAL HIGH (ref 0.0–2.5)
Total CK: 140 U/L (ref 7–177)

## 2011-06-03 LAB — MAGNESIUM: Magnesium: 1.9 mg/dL (ref 1.5–2.5)

## 2011-06-03 LAB — POCT I-STAT TROPONIN I: Troponin i, poc: 0.02 ng/mL (ref 0.00–0.08)

## 2011-06-03 LAB — ETHANOL: Alcohol, Ethyl (B): <11 mg/dL (ref 0–11)

## 2011-06-03 LAB — APTT: aPTT: 27 seconds (ref 24–37)

## 2011-06-03 LAB — TROPONIN I: Troponin I: <0.3 ng/mL (ref <0.30)

## 2011-06-03 SURGERY — LEFT HEART CATHETERIZATION WITH CORONARY ANGIOGRAM
Anesthesia: LOCAL

## 2011-06-03 MED ORDER — SODIUM CHLORIDE 0.9 % IV SOLN: 250.0000 mL | INTRAVENOUS | Status: DC | PRN

## 2011-06-03 MED ORDER — HEPARIN SODIUM (PORCINE) 5000 UNIT/ML IJ SOLN
5000.0000 [IU] | Freq: Three times a day (TID) | INTRAMUSCULAR | Status: DC
  Administered 2011-06-03 – 2011-06-05 (×7): 5000 [IU] via SUBCUTANEOUS
  Filled 2011-06-03 (×11): qty 1

## 2011-06-03 MED ORDER — LORAZEPAM 2 MG/ML IJ SOLN
INTRAMUSCULAR | Status: AC
  Administered 2011-06-03: 2 mg
  Filled 2011-06-03: qty 1

## 2011-06-03 MED ORDER — ALBUTEROL SULFATE (5 MG/ML) 0.5% IN NEBU
INHALATION_SOLUTION | RESPIRATORY_TRACT | Status: AC
  Administered 2011-06-03: 15:00:00
  Filled 2011-06-03: qty 1

## 2011-06-03 MED ORDER — IPRATROPIUM BROMIDE 0.02 % IN SOLN
RESPIRATORY_TRACT | Status: AC
  Administered 2011-06-03: 15:00:00
  Filled 2011-06-03: qty 2.5

## 2011-06-03 MED ORDER — SODIUM CHLORIDE 0.9 % IV SOLN
INTRAVENOUS | Status: DC
  Administered 2011-06-03 – 2011-06-05 (×3): via INTRAVENOUS

## 2011-06-03 MED ORDER — PANTOPRAZOLE SODIUM 40 MG IV SOLR
40.0000 mg | Freq: Every day | INTRAVENOUS | Status: DC
  Administered 2011-06-03 – 2011-06-04 (×2): 40 mg via INTRAVENOUS
  Filled 2011-06-03 (×3): qty 40

## 2011-06-03 MED ORDER — METOPROLOL TARTRATE 1 MG/ML IV SOLN
2.5000 mg | Freq: Four times a day (QID) | INTRAVENOUS | Status: DC
  Administered 2011-06-03 – 2011-06-04 (×4): 2.5 mg via INTRAVENOUS
  Administered 2011-06-05: 06:00:00 via INTRAVENOUS
  Administered 2011-06-05: 2.5 mg via INTRAVENOUS
  Filled 2011-06-03 (×9): qty 5

## 2011-06-03 MED ORDER — NICOTINE 21 MG/24HR TD PT24
21.0000 mg | MEDICATED_PATCH | TRANSDERMAL | Status: DC
Start: 2011-06-03 — End: 2011-06-04
  Administered 2011-06-03: 21 mg via TRANSDERMAL
  Filled 2011-06-03 (×2): qty 1

## 2011-06-03 MED ORDER — IPRATROPIUM BROMIDE 0.02 % IN SOLN
0.5000 mg | RESPIRATORY_TRACT | Status: DC | PRN
  Administered 2011-06-03 – 2011-06-07 (×4): 0.5 mg via RESPIRATORY_TRACT
  Filled 2011-06-03 (×4): qty 2.5

## 2011-06-03 MED ORDER — ENOXAPARIN SODIUM 40 MG/0.4ML ~~LOC~~ SOLN
40.0000 mg | Freq: Every day | SUBCUTANEOUS | Status: DC
  Filled 2011-06-03: qty 0.4

## 2011-06-03 MED ORDER — SODIUM CHLORIDE 0.9 % IV SOLN
500.0000 mg | Freq: Two times a day (BID) | INTRAVENOUS | Status: DC
  Administered 2011-06-03 – 2011-06-04 (×3): 500 mg via INTRAVENOUS
  Filled 2011-06-03 (×6): qty 5

## 2011-06-03 MED ORDER — LORAZEPAM 2 MG/ML IJ SOLN: 2.0000 mg | INTRAMUSCULAR | Status: DC | PRN

## 2011-06-03 MED ORDER — ALBUTEROL SULFATE (5 MG/ML) 0.5% IN NEBU
2.5000 mg | INHALATION_SOLUTION | RESPIRATORY_TRACT | Status: DC | PRN
  Administered 2011-06-03 – 2011-06-07 (×4): 2.5 mg via RESPIRATORY_TRACT
  Filled 2011-06-03 (×4): qty 0.5

## 2011-06-03 MED ORDER — ALBUTEROL SULFATE (5 MG/ML) 0.5% IN NEBU
2.5000 mg | INHALATION_SOLUTION | RESPIRATORY_TRACT | Status: DC
  Administered 2011-06-04 – 2011-06-05 (×10): 2.5 mg via RESPIRATORY_TRACT
  Filled 2011-06-03 (×10): qty 0.5

## 2011-06-03 MED ORDER — LABETALOL HCL 5 MG/ML IV SOLN
10.0000 mg | INTRAVENOUS | Status: DC | PRN
  Filled 2011-06-03: qty 4

## 2011-06-03 MED ORDER — SODIUM CHLORIDE 0.9 % IV SOLN
200.0000 mg | Freq: Two times a day (BID) | INTRAVENOUS | Status: DC
  Administered 2011-06-03 – 2011-06-04 (×3): 200 mg via INTRAVENOUS
  Filled 2011-06-03 (×8): qty 4

## 2011-06-03 MED ORDER — IPRATROPIUM BROMIDE 0.02 % IN SOLN
0.5000 mg | RESPIRATORY_TRACT | Status: DC
  Administered 2011-06-04 – 2011-06-05 (×10): 0.5 mg via RESPIRATORY_TRACT
  Filled 2011-06-03 (×10): qty 2.5

## 2011-06-03 MED ORDER — ASPIRIN 300 MG RE SUPP
300.0000 mg | Freq: Once | RECTAL | Status: AC
  Administered 2011-06-03: 300 mg via RECTAL
  Filled 2011-06-03: qty 1

## 2011-06-03 NOTE — ED Notes (Signed)
Pt confused. Attempting to get out of bed. Redirected pt to lay back in bed. Counselled pt on attempting to get out of bed unassisted.

## 2011-06-03 NOTE — Progress Notes (Signed)
Pt. came into ED unresponsive as a stemi and went strait to CT for scan and was later  determined to be a code stroke. I have worked with this family before and family appreciates chaplains services. Sister now at bedside. Pt. stable and being admitted.  Provided spiritual and emotional support to family. Will follow as needed.   06/03/11 1200  Clinical Encounter Type  Visited With Patient;Health care provider  Visit Type Spiritual support;ED  Referral From Nurse  Spiritual Encounters  Spiritual Needs Prayer;Emotional  Stress Factors  Family Stress Factors Health changes

## 2011-06-03 NOTE — ED Notes (Signed)
Patient transported to MRI with [place] stroke rn

## 2011-06-03 NOTE — Consult Note (Signed)
Name: Wanda Prince MRN: [id number] DOB: May 03, 1955    LOS: 0  PCCM CONSULTATION NOTE  History of Present Illness: 56 yo WF with history of seizures brought to [facility] ED after having tonic-clonic seizure lasting about 10 mintes the morning of admission.  She reportedly consumed large amount of alcohol the night before.  She was initially unresponsive upon arrival to ED with R sided weakness and preferential gaze which later resolved.  She had another seizure while in ED which resolved in 2 minutes after 2 mg of Ativan given IV.  PCCM was consulted due to concern for her respiratory status.  Lines / Drains: 2/4  Foley  Cultures: None  Antibiotics: None  Tests / Events: 2/4  Brought to [facility] ED after witnessed tonic-clinic seizure.  Consumed large amount of alcohol the night before. 2/4  Head CT>>>No acute pathology.  Right lateral and third ventricle are decompressed with catheter. 2/4  Brain MRI>>>Possible small area of acute infarct left medial thalamus.  The patient is obtunded and unable to provide history, which was obtained for available medical records.    Past Medical History  Diagnosis Date  . Meningitis 2009  . Hx of ventricular shunt 2009  . Seizure disorder   . 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 Suicide attempt     History of suicide attempts August 2006 and July 2006  . History of alcohol abuse   . History of cocaine abuse   . Major depressive disorder     History of  . History of iron deficiency   . COPD (chronic obstructive pulmonary disease)   . History of acute alcoholic hepatitis   . Psychiatric diagnosis     Multiple  . 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 Craniotomy    Prior to Admission medications   Medication Sig Start Date End Date Taking?  Authorizing Provider  albuterol (PROVENTIL HFA;VENTOLIN HFA) 108 (90 BASE) MCG/ACT inhaler Inhale 1 puff into the lungs every 6 (six) hours as needed. For shortness of breath.   Yes Historical Provider, MD  albuterol (PROVENTIL) (2.5 MG/3ML) 0.083% nebulizer solution Take 2.5 mg by nebulization 3 (three) times daily as needed. For shortness of breath.   Yes Historical Provider, MD  benazepril (LOTENSIN) 20 MG tablet Take 20 mg by mouth daily.     Yes Historical Provider, MD  docusate sodium (COLACE) 100 MG capsule Take 100 mg by mouth 3 (three) times daily as needed. For constipation.   Yes Historical Provider, MD  Fluticasone-Salmeterol (ADVAIR) 250-50 MCG/DOSE AEPB Inhale 1 puff into the lungs every 12 (twelve) hours.     Yes Historical Provider, MD  folic acid (FOLVITE) 1 MG tablet Take 1 mg by mouth daily.     Yes Historical Provider, MD  hydrochlorothiazide (MICROZIDE) 12.5 MG capsule Take 12.5 mg by mouth daily.     Yes Historical Provider, MD  ibuprofen (ADVIL,MOTRIN) 600 MG tablet Take 600 mg by mouth every 6 (six) hours as needed. For pain.   Yes Historical Provider, MD  ipratropium-albuterol (DUONEB) 0.5-2.5 (3) MG/3ML SOLN Take 3 mLs by nebulization 3 (three) times daily as needed. For shortness of breath.   Yes Historical Provider, MD  nicotine (NICODERM CQ - DOSED IN MG/24 HOURS) 21 mg/24hr patch Place 1 patch  onto the skin daily.     Yes Historical Provider, MD  phenytoin (DILANTIN) 200 MG ER capsule Take 200 mg by mouth 2 (two) times daily.     Yes Historical Provider, MD  thiamine 100 MG tablet Take 100 mg by mouth daily.     Yes Historical Provider, MD  tiotropium (SPIRIVA) 18 MCG inhalation capsule Place 18 mcg into inhaler and inhale daily.     Yes Historical Provider, MD  VITAMIN D, ERGOCALCIFEROL, PO Take 1 tablet by mouth daily.     Yes Historical Provider, MD   Allergies No Known Allergies  Family History History reviewed. No pertinent family history.  Social History   reports that she has been smoking.  She does not have any smokeless tobacco history on file. She reports that she drinks alcohol. She reports that she does not use illicit drugs.  Review Of Systems  Patient unable to provide  Vital Signs: Temp:  [98.1 F (36.7 C)-99.1 F (37.3 C)] 98.7 F (37.1 C) (02/04 1722) Pulse Rate:  [102-120] 120  (02/04 1722) Resp:  [19-24] 22  (02/04 1722) BP: (127-154)/(80-100) 127/80 mmHg (02/04 1722) SpO2:  [90 %-100 %] 94 % (02/04 1722)    Physical Examination: General:  Obtunded, arouses to pain, no distress Neuro:  Limited neurological exam, nonfocal, gag and cough present HEENT:  PERRL, pink conjunctivae, dry membranes Neck:  Supple, no JVD, old tracheostomy scar   Cardiovascular:  RRR, no M/R/G Lungs:  Bilateral diminished air entry, no W/R/R Abdomen:  Soft, nontender, nondistended, bowel sounds present Musculoskeletal:  Moves all extremities, no pedal edema, injection marks L hand Skin:  No rash, ecchymoses both upper extremituies  Ventilator settings:    Labs and Imaging:  Reviewed.  Please refer to the Assessment and Plan section for relevant results.  ASSESSMENT AND PLAN  NEUROLOGIC A:  History of seizure disorder, therapeutic phenytoin level (13.6).  Likely exacerbated by alcohol use.  Not clear if other illicit drugs involved.  Possible TIA, CVA less likely given resolution of focal findings. Also history of meningitis, subependymal abscess, ventricular shunt and craniotomy for intracranial hemorrhage related to AV malformation.  Also history of drug abuse and psychiatric disorders.  Neurology following. P: -->  Dilantin and Keppra as maintenance -->  Ativan PRN -->  Would add Thiamine / Folate -->  Would perform CIWA q4h -->  Would start ASA and Simvastatin given possibility of TIA -->  Would NOT treat hypertension at this time, unless SBP>180 or DBP>110 -->  Would check drug screen  PULMONARY No results found for this basename:  PHART:5,PCO2:5,PCO2ART:5,PO2ART:5,HCO3:5,O2SAT:5 in the last 168 hours A:  No evidence of respiratory distress of respiratory distress.  History of COPD and previous intubations. P: -->  Monitor respiratory status -->  Continue preadmission bronchodilators, Spiriva and Advair  CARDIOVASCULAR  Lab 06/03/11 1119  TROPONINI <0.30  LATICACIDVEN --   A:  No evidence of arrhythmia or ischemia. Reportedly ST elevation in the anterior leads, but I could not appreciate it.  Hemodynamically stable.  History of hypertension. P: -->  Would hold HCTZ and Lotensin if CVA/TIA is on differential, unless SBP>180 or DBP>110 -->  Trend cardiac enzymes  RENAL  Lab 06/03/11 1744 06/03/11 1125 06/03/11 1117  NA 139 137 136  K 4.2 4.4 --  CL 102 100 95*  CO2 28 -- 27  GLUCOSE 107* 259* 272*  BUN 16 19 17   CREATININE 0.45* 0.80 0.60  CALCIUM 8.8 -- 9.0  MG -- -- --  PHOS -- -- --   A:  No active issues. P: -->  Maintenance fluids -->  Would check Mg as history of ETOH  GASTROINTESTINAL  Lab 06/03/11 1117  AST 111*  ALT 90*  ALKPHOS 163*  BILITOT 0.3  PROT 7.5  ALBUMIN 3.6   A:  Likely acute alcoholic hepatitis. P: -->  Would recheck LFT  HEMATOLOGIC  Lab 06/03/11 1125 06/03/11 1117  HGB 17.7* 15.3*  HCT 52.0* 45.5  PLT -- 217  INR -- 1.17  APTT -- 27   A:  Probable polycythemia secondary to smoking vs. hemoconcentration.  P: -->  Recheck CBC  INFECTIOUS  Lab 06/03/11 1117  WBC 9.5  PROCALCITON --   A:  No evidence of acute infection. P: -->  Monitor  ENDOCRINE No results found for this basename: GLUCAP:5 in the last 168 hours A:  Elevated blood glucose on BMP, suspect DM P: -->  Would start CBG checks and SSI  BEST PRACTICE / DISPOSITION -->  ICU status under Internal Medicine Teaching Service -->  PCCM and Neurology consulting -->  Full code -->  NPO -->  Lovenox  [place] for DVT Px -->  GI Px is not indicated -->  Family is not available for update  Orlean Bradford, M.D. Pulmonary and Critical Care Medicine [facility] Cell: [phone] Pager: [phone]  06/03/2011, 7:45 PM

## 2011-06-03 NOTE — ED Notes (Signed)
Patient presents from home. Last night she was hanging out drinking with her friend. They went to sleep and this morning around 1000 her friend was about to leave when suddenly the patient started to stare off into space at 10am and then had a tonic clonic seizure lasting approx 10 minutes. Upon ems arrival they attempted to obtain an iv but were unable x 3 sticks. They ended up giving her 2.5mg  versed im which stopped the seizure activity. Upon running an ekg the patient had elevated t waves along with st elevation. Code stemi was called. Upon arrival to the er at 1109 code stemi was cancelled and due to pt's s/s they were taken directly to ct scanner.Dr. Patria Mane at the bedside upon arrival to the er. Upon arrival pt had left sided gaze and was non-responsive. Pt arrived at 1110, edp exam 1110, last seen normal 1000, arrival in ct scanner 1110, phlebotomist arrival was at 1112, labs obtained at 1114,  code stroke was called at 1140, stroke team arrival was at 1150, ct was resulted at 1139, mri ordered and pt transported over with rn at 1233. Code stroke was cancelled at 1300 per dr. Lyman Speller. First nih stroke scale completed at 1200 had a result of 25. Second repeated nih at 1300 was 18. As time has progressed pt has slowly become more and more responsive. She still remains non-verbal but is moving all extremities as of 15:30. Pt failed on the stroke swallow screen due to inability to speak and follow commands. Family at the bedside at 1230 and was notified of plan of care. Contact person is peggy waye [phone]. Nursing to continue to monitor. Waiting on admitting md with the hospitalist to complete orders. Pt resting comfortably at the moment.

## 2011-06-03 NOTE — ED Notes (Signed)
Pt having tonic clonic seizure lasting approx 2 minutes, meds given. Seizure resolved. md at the bedside with critical care.

## 2011-06-03 NOTE — Progress Notes (Signed)
Pulmonary/Critical Evaluation Note  Evaluated pt in the ED with IMTS Residents at bedside. She is 50 with complicated PMH that includes severe COPD, hx meningitis/VP shunt, seizures. She experienced seizures 2/4 w reported R hemi-neglect in the post-ictal period. MRI without acute stroke. Also w ? ST elevation ECG, initial trop negative. She experience another seizure while I was at bedside, lasted approx 90 seconds with resolution after ativan. I agree with the IM Residents that this patient should be admitted to the ICU, prefer 3100. She is high risk for resp failure and intubation. She will be admitted to IM Teaching Service and PCCM will consult.   Levy Pupa, MD, PhD 06/03/2011, 6:44 PM [place] [phone] or if no answer [phone]

## 2011-06-03 NOTE — ED Notes (Signed)
MD at bedside to write orders.

## 2011-06-03 NOTE — ED Notes (Signed)
Urine sample collected for lab.

## 2011-06-03 NOTE — Progress Notes (Signed)
Patient is noted to have elevated troponin level of 4.12>>2.45, patient remains very confused post seizure/?acute thalamus infarct and unable to tell whether she is having chest pain. EKG obtained and showed V3-V5 ST elevations.  Given her acute brain infarct, we will not proceed heparin gtt for now Plan - will continue to trend the CE - will give patient heparin SQ TID - will give her low dose Metoprolol 2.5 mg IV Q 6 hours - Will give her Aspirin Supp. And keep her npo - consider cardiology consult if worsening or troponin trending up.

## 2011-06-03 NOTE — H&P (Signed)
Hospital Admission Note Date: 06/03/2011  Patient name:  Wanda Prince  Medical record number:  [id number] Date of birth:  1956/04/13  Age: 56 y.o. Gender:  female PCP:    [facility]  Medical Service:   Internal Medicine Teaching Service   Attending physician:  Dr. Doneen Poisson First Contact:   Maren Beach  MSII     Pager: [phone] Second Contact  Dr. Kristie Cowman Pager: [phone]  Third Contact:   Dr. Bard Herbert Pager: [phone] After Hours:    First Contact   Pager: [phone]      Second Contact  Pager: [phone]   Chief Complaint: seizure   History of Present Illness: Patient is a 56 y.o. female with a PMHx of  epilepsy, COPD s/p respiratory failure and two subsequent intubation (Oct 2012), as well as h/o tracheostomy, who was brought to ED by EMS after having an observed seizure this morning.  She was unresponsive upon arriving at ED.  She was found to have-right sided weakness and preferential gaze.  Her friend reported to the ED physician that patient had consumed a large amount of alcohol (6-pack) at a [employer] party last night.  The tonic-clonic seizure lasted for ten minutes.  EKG showed ST elevation in frontal leads.  However, STEMI was assessed to be unlikely given her active neurologic symptoms and recent seizure in the setting of negative troponin.  Brain MRI has showed possible small area of acute infarct in left medial thalamus.  Per neurology consult, it was less likely to be a stroke. During our interview, patient was able to talk and her right sided weakness and preferential gaze seemed to have resolved.  However, she appeared disengaged and responded "that's about all I know" to all questions that we had asked.  She was also in respiratory distress using accessary muscles to breath.  She also had another clonic seizure that lasted for 1.5 min.  It resolved with 2mg  Ativan at bedside.   Current Outpatient Medications: Current Facility-Administered Medications  Medication Dose  Route Frequency Provider Last Rate Last Dose  . albuterol (PROVENTIL) (5 MG/ML) 0.5% nebulizer solution           . ipratropium (ATROVENT) 0.02 % nebulizer solution           . LORazepam (ATIVAN) 2 MG/ML injection        2 mg at 06/03/11 1807   Current Outpatient Prescriptions  Medication Sig Dispense Refill  . albuterol (PROVENTIL HFA;VENTOLIN HFA) 108 (90 BASE) MCG/ACT inhaler Inhale 1 puff into the lungs every 6 (six) hours as needed. For shortness of breath.      Marland Kitchen albuterol (PROVENTIL) (2.5 MG/3ML) 0.083% nebulizer solution Take 2.5 mg by nebulization 3 (three) times daily as needed. For shortness of breath.      . benazepril (LOTENSIN) 20 MG tablet Take 20 mg by mouth daily.        Marland Kitchen docusate sodium (COLACE) 100 MG capsule Take 100 mg by mouth 3 (three) times daily as needed. For constipation.      . Fluticasone-Salmeterol (ADVAIR) 250-50 MCG/DOSE AEPB Inhale 1 puff into the lungs every 12 (twelve) hours.        . folic acid (FOLVITE) 1 MG tablet Take 1 mg by mouth daily.        . hydrochlorothiazide (MICROZIDE) 12.5 MG capsule Take 12.5 mg by mouth daily.        Marland Kitchen ibuprofen (ADVIL,MOTRIN) 600 MG tablet Take 600 mg by mouth every 6 (  six) hours as needed. For pain.      Marland Kitchen ipratropium-albuterol (DUONEB) 0.5-2.5 (3) MG/3ML SOLN Take 3 mLs by nebulization 3 (three) times daily as needed. For shortness of breath.      . nicotine (NICODERM CQ - DOSED IN MG/24 HOURS) 21 mg/24hr patch Place 1 patch onto the skin daily.        . phenytoin (DILANTIN) 200 MG ER capsule Take 200 mg by mouth 2 (two) times daily.        Marland Kitchen thiamine 100 MG tablet Take 100 mg by mouth daily.        Marland Kitchen tiotropium (SPIRIVA) 18 MCG inhalation capsule Place 18 mcg into inhaler and inhale daily.        Marland Kitchen VITAMIN D, ERGOCALCIFEROL, PO Take 1 tablet by mouth daily.          Allergies: No Known Allergies   Past Medical History: Past Medical History  Diagnosis Date  . Meningitis 2009  . Hx of ventricular shunt 2009  .  Seizure disorder   . 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 Suicide attempt     History of suicide attempts August 2006 and July 2006  . History of alcohol abuse   . History of cocaine abuse   . Major depressive disorder     History of  . History of iron deficiency   . COPD (chronic obstructive pulmonary disease)   . History of acute alcoholic hepatitis   . Psychiatric diagnosis     Multiple  . 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 Craniotomy     Family History: History reviewed. No pertinent family history.  Social History: History   Social History  . Marital Status: Legally Separated    Spouse Name: N/A    Number of Children: N/A  . Years of Education: N/A   Occupational History  . Not on file.   Social History Main Topics  . Smoking status: Current Everyday Smoker  . Smokeless tobacco: Not on file  . Alcohol Use: Yes  . Drug Use: No  . Sexually Active:    Other Topics Concern  . Not on file   Social History Narrative  . No narrative on file    Review of Systems: UNABLE TO OBTAIN DUE TO LEVEL 5 CAVEAT Constitutional:    HEENT:   Respiratory:   Cardiovascular:   Gastrointestinal:   Genitourinary:   Musculoskeletal:   Skin:   Neurological:   Hematological:   Psychiatric/ Behavioral:     Vital Signs: T: 98.7 P: 120 BP: 127/80 RR: 22 O2 sat: 94% 2.5L   Physical Exam: General: Thin white female, appears older than recorded age, disheveled appearance, in acute respiratory distress, using accessory respiratory muscles sitting on padded stretcher in tripod position breathing through slightly parted lips, inattentive during the exam, perseverates same answer "thats about all I know", fidgety and appears uncomfortable  Head:  Normocephalic, atraumatic.  Eyes: PERRLA, EOMI, No signs of anemia or jaundice.  Nose: Nares clear without discharge, bleeding or hypertrophy of turbinates  Throat: Tongue intact with no sign of trauma. Slightly dry mucus membranes, Oropharynx nonerythematous, no exudate appreciated.   Neck: Supple, No carotid Bruits, no JVD.prior trach placement apparent   Lungs:  Labored breathing,  distant breath sounds, clear to auscultation with prolonged expiratory phase and few wheezes over right base  Heart: RRR. S1 and S2 normal without gallop, murmur, or rubs appreciated  Abdomen:  BS normoactive. Soft, Nondistended, non-tender.  No masses or organomegaly.appreciated  Extremities: No pretibial edema, distal pulses intact bilaterally  Neurologic: Not oriented to self, time, and place. Motor strength is 3/5 in all extremities, Sensations intact to light touch. 2+ reflex UE and LE bilaterally. Negative Babinski sign.   Skin: Flushing in face and chest. No visible rashes or erythema elsewhere.      Lab results: Basic Metabolic Panel: Recent Labs  Basename 06/03/11 1744 06/03/11 1125 06/03/11 1117   NA 139 137 --   K 4.2 4.4 --   CL 102 100 --   CO2 28 -- 27   GLUCOSE 107* 259* --   BUN 16 19 --   CREATININE 0.45* 0.80 --   CALCIUM 8.8 -- 9.0   MG -- -- --   PHOS -- -- --   Liver Function Tests: Recent Labs  [facility] 06/03/11 1117   AST 111*   ALT 90*   ALKPHOS 163*   BILITOT 0.3   PROT 7.5   ALBUMIN 3.6    CBC: Recent Labs  Basename 06/03/11 1125 06/03/11 1117   WBC -- 9.5   NEUTROABS -- 5.9   HGB 17.7* 15.3*   HCT 52.0* 45.5   MCV -- 101.3*   PLT -- 217   Cardiac Enzymes: Recent Labs  Basename 06/03/11 1119   CKTOTAL 140   CKMB 4.2*   CKMBINDEX --   TROPONINI <0.30    Anemia Panel: No results found for this basename: VITAMINB12,FOLATE,FERRITIN,TIBC,IRON,RETICCTPCT in the last 72 hours  Urine Drug Screen: Positive for benzodiazepines otherwise negative, pt s/p  Ativan administration in EMS  Alcohol Level: Recent Labs  [facility] 06/03/11 1209   ETH <11     Imaging results:  Ct Head Wo Contrast  06/03/2011  *RADIOLOGY REPORT*  Clinical Data: Altered mental status.  Seizure  CT HEAD WITHOUT CONTRAST  Technique:  Contiguous axial images were obtained from the base of the skull through the vertex without contrast.  Comparison: Multiple prior studies.  The most recent CT was 02/17/2011.  CT angiogram 07/28/2005  Findings: Prior right frontal craniotomy.  Right frontal ventricular shunt catheter extends into the third ventricle and is unchanged.  Right lateral ventricle is decompressed and slit-like. Left lateral ventricle is mildly enlarged with mild enlargement of the left temporal horn.  This is stable.  The fourth ventricle is mildly enlarged and this is unchanged.  No midline shift.  Encephalomalacia right frontal lobe is stable.  Left frontal hyperintensity is unchanged.  This is associated with small calcifications.  This appears to be a chronic process and may be related to chronic hemorrhage related to a vascular malformation. This may be a small AVM or venous malformation.  Abnormal vasculature is present in this area on the CTA 07/28/2005.  No acute hemorrhage.  Hypodensity throughout the left cerebral white matter is stable and is compatible with chronic ischemia.  No acute infarct or mass.  Sinusitis with air-fluid levels in both maxillary sinuses.  IMPRESSION: No change from the prior CT.  The right lateral and third ventricles are decompressed with a catheter.  There is mild chronic enlargement of the left lateral ventricle and fourth ventricle.  Original Report Authenticated By: Camelia Phenes, M.D.   [facility] W/o Cm  06/03/2011  *RADIOLOGY REPORT*  Clinical Data: Right-sided neglect.  Rule out CVA.  MRI HEAD WITHOUT CONTRAST  Technique:  Multiplanar, multiecho pulse sequences of the brain and surrounding structures were obtained according to  standard protocol without intravenous contrast.  Comparison: CT head 06/03/2011  Findings: Study was limited to diffusion weighted imaging only.  A complete study was not performed.  Right frontal shunt catheter is in place causing significant artifact.   Encephalomalacia in the frontal lobes bilaterally.  Possible small area of restricted diffusion in the left medial thalamus which may represent acute infarct.  Correlate with clinical symptoms.  IMPRESSION: Limited study.  Possible small area of acute infarct left medial thalamus.  Original Report Authenticated By: Camelia Phenes, M.D.     Other results: EKG:  normal sinus rhythm, ST elevation in anterior leads   Assessment & Plan: 1. Epilepsy s/p seizure: Pt's Dilantin level is therapeutic but given reported history of recent alcohol consumption, this likely lowered her seizure threshold level.  Pt experienced witnessed seizure on exam now s/p Ativan 2 mg with resolution of seizure. Likely etiology of recurrenct seizure could be alcohol withdrawal, although she has a history of cocaine abuse her urine drug screen was negative for cocaine; TIA is high on differential given CT and MRI findings; she does have h/o meningitis and s/p VP shunt thus infectious etiology must be ruled out;  PLAN -Admit to neuro ICU -continue home dose of Dilantin -start Keppra 500mg  -Neurology on consulting, appreciate recommendations -Seizure precautions -blood culture x2  2. Respiratory Distress: Pt exhibiting labored breathing on exam with prolonged expiratory phase.  Hx significant for COPD with respiratory failure thus threshold for intubation is low in this case; she is oxygenating adequately at this time with O2>90% on 2.5 L oxygen; Due to her high risk of respiratory failure and intubation, she will be monitored in ICU. PLAN -ABG to further assess respiratory status -CXR to r/o aspiration pneumonia -Consult PCCM -continue supplemental oxygen -consider BiPap  to decrease her work of breathing -PPI ppx for stress ulcer prophylasix  3.Question of CVA: new stroke not definite on imagining, Neurology has assessed, pt not tPa candidate given on-going seizure activity PLAN -continue close monitoring, appreciate neurology recommendations   4. EKG changes: initially called STEMI given EKG findings but troponin negative x1 and pt w/o complaints of chest pain. Last echo Oct 2012: EF 65%, no wall motion abnormalities.  PLAN -cycle cardiac enzymes -consult Cardiology cardiac elevated -EKG in am  5. COPD: presented with tight breath sounds, now s/p breathing treatment, currently with waxing and waning respiratory distress PLAN -CXR to r/o aspiration pneumonia -continue nebulized bronchodilators  6. Polysubstance abuse: pt continues to smoke cigarettes and drink alcohol PLAN -social work consult  DVT PPX - Lovenox SQ   Maren Beach, MS III   Kristie Cowman, M.D. (PGY1):    Bard Herbert, MD  (PGY2/3):

## 2011-06-03 NOTE — ED Notes (Signed)
Returned from mri 

## 2011-06-03 NOTE — Progress Notes (Signed)
  PHARMACY - CRITICAL CARE NOTE  Pharmacy Consult for Renal Dose Adjustment of IV Antibiotics Indication:  No antibiotics currently   Pt. Admitted with seizure activity after alcohol consumption.  She is currently undergoing neurological work-up.  No signs of infection noted and no antibiotics on board.  Her creatinine is normal and her GFR is estimated at > 90 ml/min.    Plan:  Will f/u for antibiotic needs and adjust accordingly if needed.  Nadara Mustard, PharmD., MS Clinical Pharmacist Pager [phone] 06/03/2011 [phone]

## 2011-06-03 NOTE — H&P (Deleted)
Hospital Admission Note Date: 06/03/2011  Patient name:  Wanda Prince  Medical record number:  [id number] Date of birth:  [birth year]  Age: 56 y.o. Gender:  female PCP:    [facility]  Medical Service:   Internal Medicine Teaching Service   Attending physician:  Dr. Doneen Poisson First Contact:   Maren Beach  MSII     Pager: [phone] Second Contact  Dr. Kristie Cowman Pager: [phone]  Third Contact:   Dr. Bard Herbert Pager: [phone] After Hours:    First Contact   Pager: [phone]      Second Contact  Pager: [phone]   Chief Complaint: seizure   History of Present Illness: Patient is a 56 y.o. female with a PMHx of  epilepsy, COPD s/p respiratory failure and two subsequent intubation (Oct 2012), as well as h/o tracheostomy, who was brought to ED by EMS after having an observed seizure this morning.  She was unresponsive upon arriving at ED.  She was found to have-right sided weakness and preferential gaze.  Her friend reported to the ED physician that patient had consumed a large amount of alcohol (6-pack) at a [employer] party last night.  The tonic-clonic seizure lasted for ten minutes.  EKG showed ST elevation in frontal leads.  However, STEMI was assessed to be unlikely given her active neurologic symptoms and recent seizure in the setting of negative troponin.  Brain MRI has showed possible small area of acute infarct in left medial thalamus.  Per neurology consult, it was less likely to be a stroke. During our interview, patient was able to talk and her right sided weakness and preferential gaze seemed to have resolved.  However, she appeared disengaged and responded "that's about all I know" to all questions that we had asked.  She was also in respiratory distress using accessary muscles to breath.  She also had another clonic seizure that lasted for 1.5 min.  It resolved with 2mg  Ativan at bedside.   Current Outpatient Medications: Current Facility-Administered Medications  Medication Dose  Route Frequency Provider Last Rate Last Dose  . albuterol (PROVENTIL) (5 MG/ML) 0.5% nebulizer solution           . ipratropium (ATROVENT) 0.02 % nebulizer solution           . LORazepam (ATIVAN) 2 MG/ML injection        2 mg at 06/03/11 1807   Current Outpatient Prescriptions  Medication Sig Dispense Refill  . albuterol (PROVENTIL HFA;VENTOLIN HFA) 108 (90 BASE) MCG/ACT inhaler Inhale 1 puff into the lungs every 6 (six) hours as needed. For shortness of breath.      Marland Kitchen albuterol (PROVENTIL) (2.5 MG/3ML) 0.083% nebulizer solution Take 2.5 mg by nebulization 3 (three) times daily as needed. For shortness of breath.      . benazepril (LOTENSIN) 20 MG tablet Take 20 mg by mouth daily.        Marland Kitchen docusate sodium (COLACE) 100 MG capsule Take 100 mg by mouth 3 (three) times daily as needed. For constipation.      . Fluticasone-Salmeterol (ADVAIR) 250-50 MCG/DOSE AEPB Inhale 1 puff into the lungs every 12 (twelve) hours.        . folic acid (FOLVITE) 1 MG tablet Take 1 mg by mouth daily.        . hydrochlorothiazide (MICROZIDE) 12.5 MG capsule Take 12.5 mg by mouth daily.        Marland Kitchen ibuprofen (ADVIL,MOTRIN) 600 MG tablet Take 600 mg by mouth every 6 (  six) hours as needed. For pain.      Marland Kitchen ipratropium-albuterol (DUONEB) 0.5-2.5 (3) MG/3ML SOLN Take 3 mLs by nebulization 3 (three) times daily as needed. For shortness of breath.      . nicotine (NICODERM CQ - DOSED IN MG/24 HOURS) 21 mg/24hr patch Place 1 patch onto the skin daily.        . phenytoin (DILANTIN) 200 MG ER capsule Take 200 mg by mouth 2 (two) times daily.        Marland Kitchen thiamine 100 MG tablet Take 100 mg by mouth daily.        Marland Kitchen tiotropium (SPIRIVA) 18 MCG inhalation capsule Place 18 mcg into inhaler and inhale daily.        Marland Kitchen VITAMIN D, ERGOCALCIFEROL, PO Take 1 tablet by mouth daily.          Allergies: No Known Allergies   Past Medical History: Past Medical History  Diagnosis Date  . Meningitis 2009  . Hx of ventricular shunt 2009  .  Seizure disorder   . 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 Suicide attempt     History of suicide attempts August 2006 and July 2006  . History of alcohol abuse   . History of cocaine abuse   . Major depressive disorder     History of  . History of iron deficiency   . COPD (chronic obstructive pulmonary disease)   . History of acute alcoholic hepatitis   . Psychiatric diagnosis     Multiple  . 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 Craniotomy     Family History: History reviewed. No pertinent family history.  Social History: History   Social History  . Marital Status: Legally Separated    Spouse Name: N/A    Number of Children: N/A  . Years of Education: N/A   Occupational History  . Not on file.   Social History Main Topics  . Smoking status: Current Everyday Smoker  . Smokeless tobacco: Not on file  . Alcohol Use: Yes  . Drug Use: No  . Sexually Active:    Other Topics Concern  . Not on file   Social History Narrative  . No narrative on file    Review of Systems: Constitutional:  Denies fever, chills, diaphoresis, appetite change and fatigue.  HEENT: Denies photophobia, eye pain, redness, hearing loss, ear pain, congestion, sore throat, rhinorrhea, sneezing, mouth sores, trouble swallowing, neck pain, neck stiffness and tinnitus.  Respiratory: Denies SOB, DOE, cough, chest tightness, and wheezing.  Cardiovascular: Denies chest pain, palpitations and leg swelling.  Gastrointestinal: Denies nausea, vomiting, abdominal pain, diarrhea, constipation, blood in stool and abdominal distention.  Genitourinary: Denies dysuria, urgency, frequency, hematuria, flank pain and difficulty urinating.  Musculoskeletal: Denies myalgias, back pain, joint  swelling, arthralgias and gait problem.   Skin: Denies pallor, rash and wound.  Neurological: Denies dizziness, seizures, syncope, weakness, light-headedness, numbness and headaches.   Hematological: Denies adenopathy. Easy bruising, personal or family bleeding history.  Psychiatric/ Behavioral: Denies suicidal ideation, mood changes, confusion, nervousness, sleep disturbance and agitation.    Vital Signs: T: 98.7 P: 120 BP: 127/80 RR: 22 O2 sat: 94% 2L   Physical Exam: General: Vital signs reviewed and noted. Well-developed, well-nourished, in no acute  distress; alert, appropriate and cooperative throughout examination.  Head: Normocephalic, atraumatic.  Eyes: PERRLA, EOMI, No signs of anemia or jaundice.  Nose: Nares clear without discharge or hypertrophy of turbinates  Throat: Moist mucus membranes, Oropharynx nonerythematous, no exudate appreciated.   Neck: No deformities, masses, or tenderness noted.Supple, No carotid Bruits, no JVD.  Lungs:  Normal respiratory effort. Clear to auscultation BL without crackles or wheezes from apices to bases.  Heart: RRR. S1 and S2 normal without gallop, murmur, or rubs appreciated  Abdomen:  BS normoactive. Soft, Nondistended, non-tender.  No masses or organomegaly.appreciated  Extremities: No pretibial edema, distal pulses intact bilaterally  Neurologic: A&O X3, CN II - XII are grossly intact. Motor strength is 5/5 in all extremities, Sensations intact to light touch, Cerebellar signs negative.  Skin: No visible rashes or erythema   Lab results: Basic Metabolic Panel: Recent Labs  [facility] 06/03/11 1744 06/03/11 1125 06/03/11 1117   NA 139 137 --   K 4.2 4.4 --   CL 102 100 --   CO2 28 -- 27   GLUCOSE 107* 259* --   BUN 16 19 --   CREATININE 0.45* 0.80 --   CALCIUM 8.8 -- 9.0   MG -- -- --   PHOS -- -- --   Liver Function Tests: Recent Labs  [facility] 06/03/11 1117   AST 111*   ALT 90*   ALKPHOS 163*   BILITOT 0.3   PROT 7.5    ALBUMIN 3.6    CBC: Recent Labs  Basename 06/03/11 1125 06/03/11 1117   WBC -- 9.5   NEUTROABS -- 5.9   HGB 17.7* 15.3*   HCT 52.0* 45.5   MCV -- 101.3*   PLT -- 217   Cardiac Enzymes: Recent Labs  Basename 06/03/11 1119   CKTOTAL 140   CKMB 4.2*   CKMBINDEX --   TROPONINI <0.30    Anemia Panel: No results found for this basename: VITAMINB12,FOLATE,FERRITIN,TIBC,IRON,RETICCTPCT in the last 72 hours  Urine Drug Screen: Positive for benzodiazepines otherwise negative, pt s/p Ativan administration in EMS  Alcohol Level: Recent Labs  [facility] 06/03/11 1209   ETH <11     Imaging results:  Ct Head Wo Contrast  06/03/2011  *RADIOLOGY REPORT*  Clinical Data: Altered mental status.  Seizure  CT HEAD WITHOUT CONTRAST  Technique:  Contiguous axial images were obtained from the base of the skull through the vertex without contrast.  Comparison: Multiple prior studies.  The most recent CT was March 12, 2011.  CT angiogram 07/28/2005  Findings: Prior right frontal craniotomy.  Right frontal ventricular shunt catheter extends into the third ventricle and is unchanged.  Right lateral ventricle is decompressed and slit-like. Left lateral ventricle is mildly enlarged with mild enlargement of the left temporal horn.  This is stable.  The fourth ventricle is mildly enlarged and this is unchanged.  No midline shift.  Encephalomalacia right frontal lobe is stable.  Left frontal hyperintensity is unchanged.  This is associated with small calcifications.  This appears to be a chronic process and may be related to chronic hemorrhage related to a vascular malformation. This may be a small AVM or venous malformation.  Abnormal vasculature is present in this area on the CTA 07/28/2005.  No acute hemorrhage.  Hypodensity throughout the left cerebral white matter is stable and is compatible with chronic ischemia.  No acute infarct or mass.  Sinusitis with air-fluid levels in both maxillary sinuses.  IMPRESSION: No  change from the prior CT.  The right  lateral and third ventricles are decompressed with a catheter.  There is mild chronic enlargement of the left lateral ventricle and fourth ventricle.  Original Report Authenticated By: Camelia Phenes, M.D.   [facility] W/o Cm  06/03/2011  *RADIOLOGY REPORT*  Clinical Data: Right-sided neglect.  Rule out CVA.  MRI HEAD WITHOUT CONTRAST  Technique:  Multiplanar, multiecho pulse sequences of the brain and surrounding structures were obtained according to standard protocol without intravenous contrast.  Comparison: CT head 06/03/2011  Findings: Study was limited to diffusion weighted imaging only.  A complete study was not performed.  Right frontal shunt catheter is in place causing significant artifact.   Encephalomalacia in the frontal lobes bilaterally.  Possible small area of restricted diffusion in the left medial thalamus which may represent acute infarct.  Correlate with clinical symptoms.  IMPRESSION: Limited study.  Possible small area of acute infarct left medial thalamus.  Original Report Authenticated By: Camelia Phenes, M.D.     Other results: EKG:  normal sinus rhythm, ST elevation in anterior leads   Assessment & Plan: 1. Epilepsy s/p seizure: Pt's Dilantin level is therapeutic but given reported history of recent alcohol consumption, this likely lowered her seizure threshold level.  Pt experienced witnessed seizure on exam now s/p Ativan 2 mg with resolution of seizure. Likely etiology of recurrenct seizure could be alcohol withdrawal, although she has a history of cocaine abuse her urine drug screen was negative for cocaine; TIA is high on differential given CT and MRI findings; she does have h/o meningitis and s/p VP shunt thus infectious etiology must be ruled out;  PLAN -Admit to neuro ICU -continue home dose of Dilantin -start Keppra 500mg  -Neurology on consulting, appreciate recommendations -Seizure precautions -blood culture x2  2.  Respiratory Distress: Pt exhibiting labored breathing on exam with prolonged expiratory phase.  Hx significant for COPD with respiratory failure thus threshold for intubation is low in this case; she is oxygenating adequately at this time with O2>90% on 2.5 L oxygen; Due to her high risk of respiratory failure and intubation, she will be monitored in ICU. PLAN -ABG to further assess respiratory status -CXR to r/o aspiration pneumonia -Consult PCCM -continue supplemental oxygen -consider BiPap to decrease her work of breathing -PPI ppx for stress ulcer prophylasix  3.Question of CVA: new stroke not definite on imagining, Neurology has assessed, pt not tPa candidate given on-going seizure activity PLAN -continue close monitoring, appreciate neurology recommendations   4. EKG changes: initially called STEMI given EKG findings but troponin negative x1 and pt w/o complaints of chest pain. Last echo Oct 2012: EF 65%, no wall motion abnormalities.  PLAN -cycle cardiac enzymes -consult Cardiology cardiac elevated -EKG in am  5. COPD: presented with tight breath sounds, now s/p breathing treatment, currently with waxing and waning respiratory distress PLAN -CXR to r/o aspiration pneumonia -continue nebulized bronchodilators  6. Polysubstance abuse: pt continues to smoke cigarettes and drink alcohol PLAN -social work consult  DVT PPX - Lovenox SQ   Maren Beach, MS III ___________________________________________________  Kristie Cowman, M.D. (PGY1):  ____________________________________    Date/ Time:      ____________________________________     Bard Herbert, MD  (PGY2/3):  ____________________________________    Date/ Time:      ____________________________________      I have seen and examined the patient. I reviewed the resident/fellow note and agree with the findings and plan of care as documented. My additions and revisions are included.  Signature:  ____________________________________________     Internal Medicine Teaching Service Attending    Date:    ____________________________________________

## 2011-06-03 NOTE — ED Provider Notes (Signed)
History     CSN: [id number]  Arrival date & time 06/03/11  1109   First MD Initiated Contact with Patient 06/03/11 1115      Chief Complaint  Patient presents with  . Code Stroke    The history is provided by the EMS personnel.   EMS reports the patient had a witnessed seizure at home and EMS was called for seizure disorder.  Her CBG was normal.  The patient has a known seizure disorder.  She is on Dilantin for this.  It is unclear whether or not she is compliant with her Dilantin.  She received Ativan in route.  They report no improvement in her mental status since their arrival.  On arrival to the ER just before coming into the ER bed they obtained an EKG and noted ST elevation in anterior leads and occurred STEMI was called.  Past Medical History  Diagnosis Date  . Meningitis 2009  . Hx of ventricular shunt 2009  . Seizure disorder   . 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 Suicide attempt     History of suicide attempts August 2006 and July 2006  . History of alcohol abuse   . History of cocaine abuse   . Major depressive disorder     History of  . History of iron deficiency   . COPD (chronic obstructive pulmonary disease)   . History of acute alcoholic hepatitis   . Psychiatric diagnosis     Multiple  . 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 Craniotomy     History reviewed. No pertinent family history.  History  Substance Use Topics  . Smoking status: Current Everyday Smoker  . Smokeless tobacco: Not on file  . Alcohol Use: Yes    OB History    Grav Para Term Preterm Abortions TAB SAB Ect Mult Living                  Review of Systems  Unable to perform ROS   Allergies  Review of patient's allergies indicates no known allergies.  Home Medications    Current Outpatient Rx  Name Route Sig Dispense Refill  . ALBUTEROL SULFATE (2.5 MG/3ML) 0.083% IN NEBU Nebulization Take 2.5 mg by nebulization 3 (three) times daily as needed.      Marland Kitchen BENAZEPRIL HCL 20 MG PO TABS Oral Take 20 mg by mouth daily.      Marland Kitchen DOCUSATE SODIUM 100 MG PO CAPS Oral Take 100 mg by mouth 3 (three) times daily as needed.      Marland Kitchen FLUTICASONE-SALMETEROL 250-50 MCG/DOSE IN AEPB Inhalation Inhale 1 puff into the lungs every 12 (twelve) hours.      Marland Kitchen FOLIC ACID 1 MG PO TABS Oral Take 1 mg by mouth daily.      Marland Kitchen HYDROCHLOROTHIAZIDE 12.5 MG PO CAPS Oral Take 12.5 mg by mouth daily.      . IBUPROFEN 600 MG PO TABS Oral Take 600 mg by mouth every 6 (six) hours as needed.      . IPRATROPIUM-ALBUTEROL 0.5-2.5 (3) MG/3ML IN SOLN Nebulization Take 3 mLs by nebulization 3 (three) times daily as needed.      Marland Kitchen NICOTINE 21 MG/24HR TD PT24 Transdermal Place 1 patch onto the skin daily.      Marland Kitchen  PHENYTOIN SODIUM EXTENDED 200 MG PO CAPS Oral Take 200 mg by mouth 2 (two) times daily.      Marland Kitchen PREDNISONE (PAK) 10 MG PO TABS Oral Take 10 mg by mouth daily. Taper as directed     . THIAMINE HCL 100 MG PO TABS Oral Take 100 mg by mouth daily.      Marland Kitchen TIOTROPIUM BROMIDE MONOHYDRATE 18 MCG IN CAPS Inhalation Place 18 mcg into inhaler and inhale daily.      . VENTOLIN HFA 108 (90 BASE) MCG/ACT IN AERS  USE 1 PUFF EVERY SIX HOURS AS NEEDED 1 Inhaler 11  . VITAMIN D (ERGOCALCIFEROL) PO Oral Take 1 tablet by mouth daily.        BP 131/88  Pulse 113  Temp(Src) 99.1 F (37.3 C) (Other (Comment))  Resp 24  SpO2 90%  Physical Exam  Nursing note and vitals reviewed. Constitutional: She appears well-developed and well-nourished. No distress.  HENT:  Head: Normocephalic and atraumatic.  Eyes: EOM are normal.  Neck: Normal range of motion.  Cardiovascular: Normal rate, regular rhythm and normal heart sounds.   Pulmonary/Chest: Effort normal and breath sounds normal.  Abdominal: Soft. She exhibits no  distension. There is no tenderness.  Musculoskeletal: Normal range of motion.  Skin: Skin is warm and dry.  Psychiatric: She has a normal mood and affect. Judgment normal.    ED Course  Procedures (including critical care time)   Date: 06/03/2011  Rate: 103  Rhythm: normal sinus rhythm  QRS Axis: normal  Intervals: normal  ST/T Wave abnormalities: Nonspecific ST changes  Conduction Disutrbances: none  Narrative Interpretation:   Old EKG Reviewed: No significant changes noted   CRITICAL CARE Performed by: Lyanne Co   Total critical care time: 35  Critical care time was exclusive of separately billable procedures and treating other patients.  Critical care was necessary to treat or prevent imminent or life-threatening deterioration.  Critical care was time spent personally by me on the following activities: development of treatment plan with patient and/or surrogate as well as nursing, discussions with consultants, evaluation of patient's response to treatment, examination of patient, obtaining history from patient or surrogate, ordering and performing treatments and interventions, ordering and review of laboratory studies, ordering and review of radiographic studies, pulse oximetry and re-evaluation of patient's condition.   Labs Reviewed  CBC - Abnormal; Notable for the following:    Hemoglobin 15.3 (*)    MCV 101.3 (*)    MCH 34.1 (*)    All other components within normal limits  COMPREHENSIVE METABOLIC PANEL - Abnormal; Notable for the following:    Chloride 95 (*)    Glucose, Bld 272 (*)    AST 111 (*)    ALT 90 (*)    Alkaline Phosphatase 163 (*)    All other components within normal limits  CK TOTAL AND CKMB - Abnormal; Notable for the following:    CK, MB 4.2 (*)    Relative Index 3.0 (*)    All other components within normal limits  POCT I-STAT, CHEM 8 - Abnormal; Notable for the following:    Glucose, Bld 259 (*)    Hemoglobin 17.7 (*)    HCT 52.0 (*)     All other components within normal limits  URINALYSIS, WITH MICROSCOPIC - Abnormal; Notable for the following:    Color, Urine AMBER (*) BIOCHEMICALS MAY BE AFFECTED BY COLOR   Bilirubin Urine SMALL (*)    Protein, ur 30 (*)  Bacteria, UA FEW (*)    Squamous Epithelial / LPF FEW (*)    Casts HYALINE CASTS (*)    All other components within normal limits  URINE RAPID DRUG SCREEN (HOSP PERFORMED) - Abnormal; Notable for the following:    Benzodiazepines POSITIVE (*)    All other components within normal limits  PROTIME-INR  APTT  DIFFERENTIAL  TROPONIN I  POCT I-STAT TROPONIN I  ETHANOL  PHENYTOIN LEVEL, TOTAL  PHENYTOIN LEVEL, FREE   Ct Head Wo Contrast  06/03/2011  *RADIOLOGY REPORT*  Clinical Data: Altered mental status.  Seizure  CT HEAD WITHOUT CONTRAST  Technique:  Contiguous axial images were obtained from the base of the skull through the vertex without contrast.  Comparison: Multiple prior studies.  The most recent CT was March 02, 2011.  CT angiogram 07/28/2005  Findings: Prior right frontal craniotomy.  Right frontal ventricular shunt catheter extends into the third ventricle and is unchanged.  Right lateral ventricle is decompressed and slit-like. Left lateral ventricle is mildly enlarged with mild enlargement of the left temporal horn.  This is stable.  The fourth ventricle is mildly enlarged and this is unchanged.  No midline shift.  Encephalomalacia right frontal lobe is stable.  Left frontal hyperintensity is unchanged.  This is associated with small calcifications.  This appears to be a chronic process and may be related to chronic hemorrhage related to a vascular malformation. This may be a small AVM or venous malformation.  Abnormal vasculature is present in this area on the CTA 07/28/2005.  No acute hemorrhage.  Hypodensity throughout the left cerebral white matter is stable and is compatible with chronic ischemia.  No acute infarct or mass.  Sinusitis with air-fluid levels  in both maxillary sinuses.  IMPRESSION: No change from the prior CT.  The right lateral and third ventricles are decompressed with a catheter.  There is mild chronic enlargement of the left lateral ventricle and fourth ventricle.  Original Report Authenticated By: Camelia Phenes, M.D.   Mr Brain Ltd W/o Cm  06/03/2011  *RADIOLOGY REPORT*  Clinical Data: Right-sided neglect.  Rule out CVA.  MRI HEAD WITHOUT CONTRAST  Technique:  Multiplanar, multiecho pulse sequences of the brain and surrounding structures were obtained according to standard protocol without intravenous contrast.  Comparison: CT head 06/03/2011  Findings: Study was limited to diffusion weighted imaging only.  A complete study was not performed.  Right frontal shunt catheter is in place causing significant artifact.   Encephalomalacia in the frontal lobes bilaterally.  Possible small area of restricted diffusion in the left medial thalamus which may represent acute infarct.  Correlate with clinical symptoms.  IMPRESSION: Limited study.  Possible small area of acute infarct left medial thalamus.  Original Report Authenticated By: Camelia Phenes, M.D.   I personally reviewed the patient's CT scan and the MRI.  1. Seizure   2. Todd's paralysis       MDM  The patient presented as a seizure in a code STEMI.  She does appear to have a small amount of ST elevation in anterior leads however the patient was postictal and unresponsive and I did not think she needed to be evaluated in the cardiac Cath Lab as my suspicion for hemorrhagic stroke bleed or other pathology was much higher.  Her acute head CT was negative.  At that time a better neurologic exam was able to be completed and her repeat EKG demonstrated no anterior ST elevation.  At that time she was noted to  have acute hemiparesis of her right side and preferential gaze to her left neck.  Struck was called.  The patient was seen by the neurologist and a decision was made to not give TPA.   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  Her Dilantin level is normal.  The patient's MRI demonstrates a possible acute left thalamic stroke however the neurologist has reviewed this and does not think it needs to be evaluated further as he does not believe this is truly a stroke.  I contacted the medicine team formation.  I've recommended that the patient undergo full stroke workup.  At this time as she is no longer postictal she denies chest pain.        Lyanne Co, MD 06/03/11 1700

## 2011-06-03 NOTE — Consult Note (Addendum)
Chief Complaint: "right hemiparesis and right-sided neglect"  HPI: Wanda Prince is an 56 y.o. female who was last normal at 10:00 am who was found to be seizing. She was partying last night for the [employer] and was drinking some beers as per family. Code Stroke was called due to right sided paralysis and left gaze deviation after the seizure. Initial NIHSS of 25 that decreased to 18. MRI DWI did not definitively show a new CVA.  LSN: 10:00 am tPA Given: seizure at onset of symptoms mRankin: 0  Past Medical History  Diagnosis Date  . Meningitis 2009  . Hx of ventricular shunt 2009  . Seizure disorder   . 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 Suicide attempt     History of suicide attempts August 2006 and July 2006  . History of alcohol abuse   . History of cocaine abuse   . Major depressive disorder     History of  . History of iron deficiency   . COPD (chronic obstructive pulmonary disease)   . History of acute alcoholic hepatitis   . Psychiatric diagnosis     Multiple  . 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 Craniotomy    History reviewed. No pertinent family history. Social History:  reports that she has been smoking.  She does not have any smokeless tobacco history on file. She reports that she drinks alcohol. She reports that she does not use illicit drugs.  Allergies: No Known Allergies  Medications: I have reviewed the patient's current medications.  ROS: as above  Physical Examination: Blood pressure 145/96, pulse 102, temperature 98.2 F (36.8 C), temperature source Rectal, resp. rate 19, SpO2 95.00%.  Neurologic Examination: MS: confused with left gaze preference, aphasic, would not follow commands CN: EOMI, PERRL, no face asymmetry Motor: moved left side  spontaneously with no visible deficit, moved right legs spontaneously, right arm appeared to be paretic Sensory: intact to pain throughout Coord: could not assess Gait: could not assess  Results for orders placed during the hospital encounter of 06/03/11 (from the past 48 hour(s))  PROTIME-INR     Status: Normal   Collection Time   06/03/11 11:17 AM      Component Value Range Comment   Prothrombin Time 15.1  11.6 - 15.2 (seconds)    INR 1.17  0.00 - 1.49    APTT     Status: Normal   Collection Time   06/03/11 11:17 AM      Component Value Range Comment   aPTT 27  24 - 37 (seconds)   CBC     Status: Abnormal   Collection Time   06/03/11 11:17 AM      Component Value Range Comment   WBC 9.5  4.0 - 10.5 (K/uL)    RBC 4.49  3.87 - 5.11 (MIL/uL)    Hemoglobin 15.3 (*) 12.0 - 15.0 (g/dL)    HCT 16.1  09.6 - 04.5 (%)    MCV 101.3 (*) 78.0 - 100.0 (fL)    MCH 34.1 (*) 26.0 - 34.0 (pg)    MCHC 33.6  30.0 - 36.0 (g/dL)    RDW 40.9  81.1 - 91.4 (%)    Platelets 217  150 - 400 (K/uL)   DIFFERENTIAL  Status: Normal   Collection Time   06/03/11 11:17 AM      Component Value Range Comment   Neutrophils Relative 62  43 - 77 (%)    Neutro Abs 5.9  1.7 - 7.7 (K/uL)    Lymphocytes Relative 27  12 - 46 (%)    Lymphs Abs 2.6  0.7 - 4.0 (K/uL)    Monocytes Relative 9  3 - 12 (%)    Monocytes Absolute 0.8  0.1 - 1.0 (K/uL)    Eosinophils Relative 1  0 - 5 (%)    Eosinophils Absolute 0.1  0.0 - 0.7 (K/uL)    Basophils Relative 0  0 - 1 (%)    Basophils Absolute 0.0  0.0 - 0.1 (K/uL)   COMPREHENSIVE METABOLIC PANEL     Status: Abnormal   Collection Time   06/03/11 11:17 AM      Component Value Range Comment   Sodium 136  135 - 145 (mEq/L)    Potassium 4.0  3.5 - 5.1 (mEq/L)    Chloride 95 (*) 96 - 112 (mEq/L)    CO2 27  19 - 32 (mEq/L)    Glucose, Bld 272 (*) 70 - 99 (mg/dL)    BUN 17  6 - 23 (mg/dL)    Creatinine, Ser 1.61  0.50 - 1.10 (mg/dL)    Calcium 9.0  8.4 - 10.5 (mg/dL)    Total  Protein 7.5  6.0 - 8.3 (g/dL)    Albumin 3.6  3.5 - 5.2 (g/dL)    AST 096 (*) 0 - 37 (U/L)    ALT 90 (*) 0 - 35 (U/L)    Alkaline Phosphatase 163 (*) 39 - 117 (U/L)    Total Bilirubin 0.3  0.3 - 1.2 (mg/dL)    GFR calc non Af Amer >90  >90 (mL/min)    GFR calc Af Amer >90  >90 (mL/min)   CK TOTAL AND CKMB     Status: Abnormal   Collection Time   06/03/11 11:19 AM      Component Value Range Comment   Total CK 140  7 - 177 (U/L)    CK, MB 4.2 (*) 0.3 - 4.0 (ng/mL)    Relative Index 3.0 (*) 0.0 - 2.5    TROPONIN I     Status: Normal   Collection Time   06/03/11 11:19 AM      Component Value Range Comment   Troponin I <0.30  <0.30 (ng/mL)   POCT I-STAT, CHEM 8     Status: Abnormal   Collection Time   06/03/11 11:25 AM      Component Value Range Comment   Sodium 137  135 - 145 (mEq/L)    Potassium 4.4  3.5 - 5.1 (mEq/L)    Chloride 100  96 - 112 (mEq/L)    BUN 19  6 - 23 (mg/dL)    Creatinine, Ser 0.45  0.50 - 1.10 (mg/dL)    Glucose, Bld 409 (*) 70 - 99 (mg/dL)    Calcium, Ion 8.11  1.12 - 1.32 (mmol/L)    TCO2 30  0 - 100 (mmol/L)    Hemoglobin 17.7 (*) 12.0 - 15.0 (g/dL)    HCT 91.4 (*) 78.2 - 46.0 (%)   POCT I-STAT TROPONIN I     Status: Normal   Collection Time   06/03/11 11:26 AM      Component Value Range Comment   Troponin i, poc 0.02  0.00 - 0.08 (ng/mL)  Comment 3            URINALYSIS, WITH MICROSCOPIC     Status: Abnormal   Collection Time   06/03/11 12:04 PM      Component Value Range Comment   Color, Urine AMBER (*) YELLOW  BIOCHEMICALS MAY BE AFFECTED BY COLOR   APPearance CLEAR  CLEAR     Specific Gravity, Urine 1.026  1.005 - 1.030     pH 6.0  5.0 - 8.0     Glucose, UA NEGATIVE  NEGATIVE (mg/dL)    Hgb urine dipstick NEGATIVE  NEGATIVE     Bilirubin Urine SMALL (*) NEGATIVE     Ketones, ur NEGATIVE  NEGATIVE (mg/dL)    Protein, ur 30 (*) NEGATIVE (mg/dL)    Urobilinogen, UA 1.0  0.0 - 1.0 (mg/dL)    Nitrite NEGATIVE  NEGATIVE     Leukocytes, UA NEGATIVE   NEGATIVE     WBC, UA 0-2  <3 (WBC/hpf)    Bacteria, UA FEW (*) RARE     Squamous Epithelial / LPF FEW (*) RARE     Casts HYALINE CASTS (*) NEGATIVE    URINE RAPID DRUG SCREEN (HOSP PERFORMED)     Status: Abnormal   Collection Time   06/03/11 12:04 PM      Component Value Range Comment   Opiates NONE DETECTED  NONE DETECTED     Cocaine NONE DETECTED  NONE DETECTED     Benzodiazepines POSITIVE (*) NONE DETECTED     Amphetamines NONE DETECTED  NONE DETECTED     Tetrahydrocannabinol NONE DETECTED  NONE DETECTED     Barbiturates NONE DETECTED  NONE DETECTED    ETHANOL     Status: Normal   Collection Time   06/03/11 12:09 PM      Component Value Range Comment   Alcohol, Ethyl (B) <11  0 - 11 (mg/dL)    Ct Head Wo Contrast  06/03/2011  *RADIOLOGY REPORT*  Clinical Data: Altered mental status.  Seizure  CT HEAD WITHOUT CONTRAST  Technique:  Contiguous axial images were obtained from the base of the skull through the vertex without contrast.  Comparison: Multiple prior studies.  The most recent CT was 2011-02-16.  CT angiogram 07/28/2005  Findings: Prior right frontal craniotomy.  Right frontal ventricular shunt catheter extends into the third ventricle and is unchanged.  Right lateral ventricle is decompressed and slit-like. Left lateral ventricle is mildly enlarged with mild enlargement of the left temporal horn.  This is stable.  The fourth ventricle is mildly enlarged and this is unchanged.  No midline shift.  Encephalomalacia right frontal lobe is stable.  Left frontal hyperintensity is unchanged.  This is associated with small calcifications.  This appears to be a chronic process and may be related to chronic hemorrhage related to a vascular malformation. This may be a small AVM or venous malformation.  Abnormal vasculature is present in this area on the CTA 07/28/2005.  No acute hemorrhage.  Hypodensity throughout the left cerebral white matter is stable and is compatible with chronic ischemia.  No  acute infarct or mass.  Sinusitis with air-fluid levels in both maxillary sinuses.  IMPRESSION: No change from the prior CT.  The right lateral and third ventricles are decompressed with a catheter.  There is mild chronic enlargement of the left lateral ventricle and fourth ventricle.  Original Report Authenticated By: Camelia Phenes, M.D.   Mr Brain Ltd W/o Cm  06/03/2011  *RADIOLOGY REPORT*  Clinical Data:  Right-sided neglect.  Rule out CVA.  MRI HEAD WITHOUT CONTRAST  Technique:  Multiplanar, multiecho pulse sequences of the brain and surrounding structures were obtained according to standard protocol without intravenous contrast.  Comparison: CT head 06/03/2011  Findings: Study was limited to diffusion weighted imaging only.  A complete study was not performed.  Right frontal shunt catheter is in place causing significant artifact.   Encephalomalacia in the frontal lobes bilaterally.  Possible small area of restricted diffusion in the left medial thalamus which may represent acute infarct.  Correlate with clinical symptoms.  IMPRESSION: Limited study.  Possible small area of acute infarct left medial thalamus.  Original Report Authenticated By: Camelia Phenes, M.D.   Assessment: 56 y.o. female with epilepsy who was drinking last night despite having epilepsy and had a seizure this morning with residual right-sided neglect and right hemiparesis. NIHSS of 25, but not t-PA candidate due to seizure at onset of symptoms. MRI DWI was performed and a new stroke was not definitively seen on the imaging. 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 At the time of the end of the MRI, patient began to move her right arm and was able to cross her eyes over the midline.   Stroke Risk Factors - none  Plan: 1. Dilantin level 2. Close monitoring until she returns back to baseline - probably should stay overnight on  medicine service 3. Counseling regarding avoidance of drug and alcohol use 4. Will counsel patient regarding precautions that an epileptic should follow when she is back to baseline 5. Will follow  Mckynna Vanloan 06/03/2011, 1:19 PM

## 2011-06-04 ENCOUNTER — Inpatient Hospital Stay (HOSPITAL_COMMUNITY): Payer: Medicaid Other

## 2011-06-04 DIAGNOSIS — G40909 Epilepsy, unspecified, not intractable, without status epilepticus: Principal | ICD-10-CM

## 2011-06-04 DIAGNOSIS — R0989 Other specified symptoms and signs involving the circulatory and respiratory systems: Secondary | ICD-10-CM

## 2011-06-04 DIAGNOSIS — Z87891 Personal history of nicotine dependence: Secondary | ICD-10-CM | POA: Diagnosis present

## 2011-06-04 DIAGNOSIS — R0609 Other forms of dyspnea: Secondary | ICD-10-CM

## 2011-06-04 DIAGNOSIS — F1010 Alcohol abuse, uncomplicated: Secondary | ICD-10-CM | POA: Diagnosis present

## 2011-06-04 DIAGNOSIS — J449 Chronic obstructive pulmonary disease, unspecified: Secondary | ICD-10-CM

## 2011-06-04 DIAGNOSIS — J4489 Other specified chronic obstructive pulmonary disease: Secondary | ICD-10-CM

## 2011-06-04 DIAGNOSIS — J438 Other emphysema: Secondary | ICD-10-CM

## 2011-06-04 DIAGNOSIS — I213 ST elevation (STEMI) myocardial infarction of unspecified site: Secondary | ICD-10-CM | POA: Diagnosis present

## 2011-06-04 DIAGNOSIS — I214 Non-ST elevation (NSTEMI) myocardial infarction: Secondary | ICD-10-CM

## 2011-06-04 DIAGNOSIS — G40309 Generalized idiopathic epilepsy and epileptic syndromes, not intractable, without status epilepticus: Secondary | ICD-10-CM

## 2011-06-04 DIAGNOSIS — F17210 Nicotine dependence, cigarettes, uncomplicated: Secondary | ICD-10-CM | POA: Diagnosis present

## 2011-06-04 LAB — URINE MICROSCOPIC-ADD ON

## 2011-06-04 LAB — BASIC METABOLIC PANEL
BUN: 17 mg/dL (ref 6–23)
CO2: 25 mEq/L (ref 19–32)
Calcium: 8.3 mg/dL — ABNORMAL LOW (ref 8.4–10.5)
Chloride: 105 mEq/L (ref 96–112)
Creatinine, Ser: 0.46 mg/dL — ABNORMAL LOW (ref 0.50–1.10)
GFR calc Af Amer: >90 mL/min (ref >90)
GFR calc non Af Amer: >90 mL/min (ref >90)
Glucose, Bld: 129 mg/dL — ABNORMAL HIGH (ref 70–99)
Potassium: 4 mEq/L (ref 3.5–5.1)
Sodium: 139 mEq/L (ref 135–145)

## 2011-06-04 LAB — CARDIAC PANEL(CRET KIN+CKTOT+MB+TROPI)
CK, MB: 16.6 ng/mL (ref 0.3–4.0)
CK, MB: 14.3 ng/mL (ref 0.3–4.0)
Relative Index: 5.7 — ABNORMAL HIGH (ref 0.0–2.5)
Relative Index: 5.5 — ABNORMAL HIGH (ref 0.0–2.5)
Total CK: 289 U/L — ABNORMAL HIGH (ref 7–177)
Total CK: 259 U/L — ABNORMAL HIGH (ref 7–177)
Troponin I: 3.97 ng/mL (ref <0.30)
Troponin I: 3.17 ng/mL (ref <0.30)

## 2011-06-04 LAB — URINE CULTURE
Colony Count: NO GROWTH
Culture  Setup Time: 201302050208
Culture: NO GROWTH

## 2011-06-04 LAB — URINALYSIS, ROUTINE W REFLEX MICROSCOPIC
Glucose, UA: NEGATIVE mg/dL
Hgb urine dipstick: NEGATIVE
Ketones, ur: 15 mg/dL — AB
Leukocytes, UA: NEGATIVE
Nitrite: NEGATIVE
Protein, ur: 30 mg/dL — AB
Specific Gravity, Urine: 1.029 (ref 1.005–1.030)
Urobilinogen, UA: 1 mg/dL (ref 0.0–1.0)
pH: 5.5 (ref 5.0–8.0)

## 2011-06-04 LAB — PHENYTOIN LEVEL, TOTAL: Phenytoin Lvl: 11.4 ug/mL (ref 10.0–20.0)

## 2011-06-04 LAB — CBC
HCT: 40.3 % (ref 36.0–46.0)
Hemoglobin: 13.3 g/dL (ref 12.0–15.0)
MCH: 33.6 pg (ref 26.0–34.0)
MCHC: 33 g/dL (ref 30.0–36.0)
MCV: 101.8 fL — ABNORMAL HIGH (ref 78.0–100.0)
Platelets: 138 10*3/uL — ABNORMAL LOW (ref 150–400)
RBC: 3.96 MIL/uL (ref 3.87–5.11)
RDW: 13.3 % (ref 11.5–15.5)
WBC: 15.1 10*3/uL — ABNORMAL HIGH (ref 4.0–10.5)

## 2011-06-04 LAB — ALBUMIN: Albumin: 2.8 g/dL — ABNORMAL LOW (ref 3.5–5.2)

## 2011-06-04 LAB — LACTIC ACID, PLASMA: Lactic Acid, Venous: 0.7 mmol/L (ref 0.5–2.2)

## 2011-06-04 LAB — PROCALCITONIN: Procalcitonin: <0.1 ng/mL

## 2011-06-04 LAB — VITAMIN B12: Vitamin B-12: 992 pg/mL — ABNORMAL HIGH (ref 211–911)

## 2011-06-04 MED ORDER — LORAZEPAM 1 MG PO TABS
1.0000 mg | ORAL_TABLET | Freq: Four times a day (QID) | ORAL | Status: DC | PRN
  Administered 2011-06-05 – 2011-06-06 (×2): 1 mg via ORAL
  Filled 2011-06-04 (×3): qty 1

## 2011-06-04 MED ORDER — NICOTINE 14 MG/24HR TD PT24: 14.0000 mg | MEDICATED_PATCH | Freq: Every day | TRANSDERMAL | Status: DC

## 2011-06-04 MED ORDER — FOLIC ACID 1 MG PO TABS
1.0000 mg | ORAL_TABLET | Freq: Every day | ORAL | Status: DC
  Administered 2011-06-04 – 2011-06-05 (×2): 1 mg via ORAL
  Filled 2011-06-04 (×4): qty 1

## 2011-06-04 MED ORDER — NICOTINE 21 MG/24HR TD PT24
21.0000 mg | MEDICATED_PATCH | Freq: Every day | TRANSDERMAL | Status: DC
  Administered 2011-06-04 – 2011-06-07 (×3): 21 mg via TRANSDERMAL
  Filled 2011-06-04 (×4): qty 1

## 2011-06-04 MED ORDER — LORAZEPAM 2 MG/ML IJ SOLN: 1.0000 mg | Freq: Four times a day (QID) | INTRAMUSCULAR | Status: DC | PRN

## 2011-06-04 MED ORDER — THIAMINE HCL 100 MG/ML IJ SOLN
100.0000 mg | Freq: Every day | INTRAMUSCULAR | Status: DC
  Filled 2011-06-04 (×3): qty 1

## 2011-06-04 MED ORDER — ASPIRIN 81 MG PO CHEW
81.0000 mg | CHEWABLE_TABLET | Freq: Every day | ORAL | Status: DC
  Administered 2011-06-04 – 2011-06-07 (×3): 81 mg via ORAL
  Filled 2011-06-04 (×3): qty 1

## 2011-06-04 MED ORDER — NICOTINE 7 MG/24HR TD PT24: 7.0000 mg | MEDICATED_PATCH | Freq: Every day | TRANSDERMAL | Status: DC

## 2011-06-04 MED ORDER — ACETAMINOPHEN 650 MG RE SUPP
650.0000 mg | Freq: Four times a day (QID) | RECTAL | Status: DC | PRN
  Filled 2011-06-04: qty 1

## 2011-06-04 MED ORDER — VITAMIN B-1 100 MG PO TABS
100.0000 mg | ORAL_TABLET | Freq: Every day | ORAL | Status: DC
  Administered 2011-06-04 – 2011-06-05 (×2): 100 mg via ORAL
  Filled 2011-06-04 (×4): qty 1

## 2011-06-04 MED ORDER — PIPERACILLIN-TAZOBACTAM 3.375 G IVPB
3.3750 g | Freq: Three times a day (TID) | INTRAVENOUS | Status: DC
  Administered 2011-06-04 – 2011-06-05 (×4): 3.375 g via INTRAVENOUS
  Filled 2011-06-04 (×8): qty 50

## 2011-06-04 MED ORDER — ADULT MULTIVITAMIN W/MINERALS CH
1.0000 | ORAL_TABLET | Freq: Every day | ORAL | Status: DC
  Administered 2011-06-04 – 2011-06-05 (×2): 1 via ORAL
  Filled 2011-06-04 (×4): qty 1

## 2011-06-04 NOTE — Progress Notes (Signed)
  Echocardiogram 2D Echocardiogram has been performed.  Wanda Prince, Real Cons 06/04/2011, 5:29 PM

## 2011-06-04 NOTE — Progress Notes (Addendum)
[facility] PROGRESS NOTE    SUBJECTIVE  No complaints, no further seizure activity over night.  Patient is very figity at this moment   OBJECTIVE   Vital signs in last 24 hours: Temp:  [97.9 F (36.6 C)-102 F (38.9 C)] 99.7 F (37.6 C) (02/05 0800) Pulse Rate:  [92-120] 96  (02/05 0800) Resp:  [19-32] 23  (02/05 0800) BP: (89-154)/(58-100) 95/67 mmHg (02/05 0800) SpO2:  [90 %-100 %] 93 % (02/05 0800) Weight:  [50.6 kg (111 lb 8.8 oz)] 50.6 kg (111 lb 8.8 oz) (02/05 0446)  Intake/Output from previous day: 02/04 0701 - 02/05 0700 In: 1312.3 [I.V.:1043.3; IV Piggyback:269] Out: 435 [Urine:435] Intake/Output this shift: Total I/O In: 100 [I.V.:100] Out: -  Nutritional status: NPO  Past Medical History  Diagnosis Date  . Meningitis 2009  . Hx of ventricular shunt 2009  . Seizure disorder   . 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 Suicide attempt     History of suicide attempts August 2006 and July 2006  . History of alcohol abuse   . History of cocaine abuse   . Major depressive disorder     History of  . History of iron deficiency   . COPD (chronic obstructive pulmonary disease)   . History of acute alcoholic hepatitis   . Psychiatric diagnosis     Multiple  . 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  Very figity and restless. Speech fluent without evidence of aphasia. Able to follow 3 step commands without difficulty. Cranial Nerves: II-Visual fields grossly intact. III/IV/VI-Extraocular movements intact.  Pupils reactive bilaterally. V/VII-Smile symmetric VIII-grossly intact IX/X-normal gag XI-bilateral shoulder shrug XII-midline tongue extension Motor: 5/5 bilaterally  with normal tone and bulk, positive hoffman's and positive palmomental Sensory: Pinprick and light touch intact throughout, bilaterally Deep Tendon Reflexes: 2+ and symmetric throughout Plantars: Downgoing bilaterally Cerebellar: Normal finger-to-nose, normal rapid alternating movements and normal heel-to-shin test.   Lab Results: Lab Results  Component Value Date/Time   CHOL 214* 11/03/2008  8:17 PM   Lipid Panel No results found for this basename: CHOL,TRIG,HDL,CHOLHDL,VLDL,LDLCALC in the last 72 hours  Studies/Results: Ct Head Wo Contrast  06/03/2011  *RADIOLOGY REPORT*  Clinical Data: Altered mental status.  Seizure  CT HEAD WITHOUT CONTRAST  Technique:  Contiguous axial images were obtained from the base of the skull through the vertex without contrast.  Comparison: Multiple prior studies.  The most recent CT was February 21, 2011.  CT angiogram 07/28/2005  Findings: Prior right frontal craniotomy.  Right frontal ventricular shunt catheter extends into the third ventricle and is unchanged.  Right lateral ventricle is decompressed and slit-like. Left lateral ventricle is mildly enlarged with mild enlargement of the left temporal horn.  This is stable.  The fourth ventricle is mildly enlarged and this is unchanged.  No midline shift.  Encephalomalacia right frontal lobe is stable.  Left frontal hyperintensity is unchanged.  This is associated with small calcifications.  This appears to be a chronic process and may be related to chronic hemorrhage related to  a vascular malformation. This may be a small AVM or venous malformation.  Abnormal vasculature is present in this area on the CTA 07/28/2005.  No acute hemorrhage.  Hypodensity throughout the left cerebral white matter is stable and is compatible with chronic ischemia.  No acute infarct or mass.  Sinusitis with air-fluid levels in both maxillary sinuses.  IMPRESSION: No change from the prior CT.  The right lateral and third ventricles are decompressed  with a catheter.  There is mild chronic enlargement of the left lateral ventricle and fourth ventricle.  Original Report Authenticated By: Camelia Phenes, M.D.   Dg Chest Port 1 View  06/03/2011  *RADIOLOGY REPORT*  Clinical Data: Respiratory distress  PORTABLE CHEST - 1 VIEW  Comparison: 02/13/2011  Findings: VP shunt tubing over the right hemithorax.  Heart size normal.  No effusion.  New interstitial and airspace infiltrates or edema in the left mid and lower lung and to a lesser at the right lung base.  Regional bones unremarkable. Bilateral rib fractures.  IMPRESSION:  1.  Asymmetric bibasilar infiltrates or atypical edema, left worse than right.  Original Report Authenticated By: Osa Craver, M.D.   Mr Brain Ltd W/o Cm  06/03/2011  *RADIOLOGY REPORT*  Clinical Data: Right-sided neglect.  Rule out CVA.  MRI HEAD WITHOUT CONTRAST  Technique:  Multiplanar, multiecho pulse sequences of the brain and surrounding structures were obtained according to standard protocol without intravenous contrast.  Comparison: CT head 06/03/2011  Findings: Study was limited to diffusion weighted imaging only.  A complete study was not performed.  Right frontal shunt catheter is in place causing significant artifact.   Encephalomalacia in the frontal lobes bilaterally.  Possible small area of restricted diffusion in the left medial thalamus which may represent acute infarct.  Correlate with clinical symptoms.  IMPRESSION: Limited study.  Possible small area of acute infarct left medial thalamus.  Original Report Authenticated By: Camelia Phenes, M.D.    Medications:     Scheduled:   . albuterol  2.5 mg Nebulization Q4H   And  . ipratropium  0.5 mg Nebulization Q4H  . albuterol      . aspirin  300 mg Rectal Once  . heparin subcutaneous  5,000 Units Subcutaneous Q8H  . ipratropium      . levetiracetam  500 mg Intravenous Q12H  . LORazepam      . metoprolol  2.5 mg Intravenous Q6H  . nicotine  21 mg  Transdermal Q24H  . pantoprazole (PROTONIX) IV  40 mg Intravenous QHS  . phenytoin (DILANTIN) IV  200 mg Intravenous BID  . piperacillin-tazobactam (ZOSYN)  IV  3.375 g Intravenous Q8H  . 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   Altered mental status (06/03/2011)   Assessment: Resolving    I have discussed with Dr. Lyman Speller.  Neurology will sign off at this time.   Felicie Morn PA-C [facility] [phone]  06/04/2011, 9:38 AM

## 2011-06-04 NOTE — Evaluation (Signed)
Clinical/Bedside Swallow Evaluation Patient Details  Name: Wanda Prince MRN: [id number] DOB: [birth year] Today's Date: 06/04/2011  Past Medical History:  Past Medical History  Diagnosis Date  . Meningitis 2009  . Hx of ventricular shunt 2009  . Seizure disorder   . 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 Suicide attempt     History of suicide attempts August 2006 and July 2006  . History of alcohol abuse   . History of cocaine abuse   . Major depressive disorder     History of  . History of iron deficiency   . COPD (chronic obstructive pulmonary disease)   . History of acute alcoholic hepatitis   . Psychiatric diagnosis     Multiple  . 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 Craniotomy    HPI:  56 yr old admitted after witnessed seizure.  MRI revealed possible small area of infarct, neurology consulted and felt unlikely due to new infarct.  MD stated pt. liekly had a MI.  PMH:  polysubstance abuse, seizure disorder, meningitis, ventricular shunt, suicide attempt x 2 in 2006.  CXR infiltrates left greater than right.  History of swallow assessments in 2007, however, unable to find documentation.    Assessment/Recommendations/Treatment Plan   SLP Assessment Clinical Impression Statement: Pt. with initial indications of possible compromised airway after volitional cough led to coughing episode.  Increased work of breathing observed with use of accessory muscles.  Suspect cough after first cup sip thin and mild cough after applesauce likely due to impaired respiratory pattern after volitional coughing episode.  Recommend regular texture diet and thin liquids with precautions of small cup sips and rest breaks for adequate respiratory pattern. Risk for Aspiration:  Moderate Other Related Risk Factors:  (COPD)  Swallow Evaluation Recommendations Solid Consistency: Regular Liquid Consistency: Thin Liquid Administration via: No straw;Cup Medication Administration: Whole meds with puree Supervision: Full supervision/cueing for compensatory strategies Compensations: Slow rate;Small sips/bites (FREQUENT REST BREAKS) Postural Changes and/or Swallow Maneuvers: Seated upright 90 degrees Oral Care Recommendations: Oral care BID Follow up Recommendations:  (TBD)  Treatment Plan Treatment Plan Recommendations: Therapy as outlined in treatment plan below Speech Therapy Frequency: min 2x/week Treatment Duration: 2 weeks Interventions: Aspiration precaution training;Patient/family education;Diet toleration management by SLP;Compensatory techniques  Prognosis Prognosis for Safe Diet Advancement: Good  Individuals Consulted Consulted and Agree with Results and Recommendations: Patient  Swallowing Goals  SLP Swallowing Goals Patient will consume recommended diet without observed clinical signs of aspiration with: Minimal cueing Patient will utilize recommended strategies during swallow to increase swallowing safety with: Minimal cueing  Swallow Study General  HPI: 56 yr old admitted after witnessed seizure.  MRI revealed possible small area of infarct, neurology consulted and felt unlikely due to new infarct.  MD stated pt. liekly had a MI.  PMH:  polysubstance abuse, seizure disorder, meningitis, ventricular shunt, suicide attempt x 2 in 2006.  CXR infiltrates left greater than right.  History of swallow assessments in 2007, however, unable to find documentation.  Type of Study: Bedside swallow evaluation Diet Prior to this Study: NPO History of Intubation: Yes Length of Intubations (days):  (Oct 2012 (unsure lenth of time)) Behavior/Cognition: Alert;Cooperative;Pleasant mood Oral Cavity - Dentition:  (some missing dentition) Vision: Functional for  self-feeding Patient  Positioning: Upright in bed Baseline Vocal Quality: Clear Volitional Cough: Strong (led to coughing episode) Volitional Swallow: Able to elicit  Oral Motor/Sensory Function  Overall Oral Motor/Sensory Function: Impaired Labial ROM:  (slight generalized weakness) Labial Symmetry: Within Functional Limits Lingual ROM:  (slight deviation to right) Lingual Symmetry: Within Functional Limits Facial ROM: Within Functional Limits Facial Symmetry: Within Functional Limits Facial Strength: Within Functional Limits Velum: Within Functional Limits Mandible: Within Functional Limits  Consistency Results  Ice Chips Ice chips: Not tested  Thin Liquid Thin Liquid: Impaired Presentation: Cup Pharyngeal  Phase Impairments: Cough - Immediate (mild cough)  Nectar Thick Liquid Nectar Thick Liquid: Not tested  Honey Thick Liquid Honey Thick Liquid: Not tested  Puree Puree: Within functional limits  Solid Solid: Within functional limits   Royce Macadamia M.Ed [employer] [phone]  06/04/2011

## 2011-06-04 NOTE — H&P (Signed)
Internal Medicine Attending Admission Note Date: 06/04/2011  Patient name: Wanda Prince Medical record number: [id number] Date of birth: 12-30-1955 Age: 56 y.o. Gender: female  I saw and evaluated the patient. I reviewed the resident's note and I agree with the resident's findings and plan as documented in the resident's note.  Chief Complaint(s):  Seizures  History - key components related to admission:  Briefly, Wanda Prince was in her usual state of health until the morning of admission when she developed the onset of seizures. She has a history of tonic clonic seizures and is on chronic medication for such. She went to a [employer] party the night before and was reportedly drinking heavily. A friend noted she drank up to a six pack of beer herself. Wanda Prince states she has been compliant with her medications, has had no fevers or chills, denies headaches, or trauma to the head. She does admit some confusion with her medications but insists she has been compliant with her Dilantin.  Of note, she denies any focal weakness or numbness. She also denies any chest or abdominal pain prior to her presentation. She doe admit to being slightly short of breath above her baseline this morning. She is without any new complaints.  Physical Exam - key components related to admission:  Filed Vitals:   06/04/11 0800 06/04/11 0900 06/04/11 1000 06/04/11 1100  BP: 95/67 100/66 100/68 99/66  Pulse: 96 95 96 97  Temp: 99.7 F (37.6 C) 99.5 F (37.5 C) 98.4 F (36.9 C)   TempSrc: Core (Comment)     Resp: 23 21 20 21   Height:      Weight:      SpO2: 93% 94% 98% 96%   General:  Well-developed, well-nourished woman lying in bed mildly tachypneic but in no distress. She appears at least 10 years older than her stated age Neck: Without jugular venous distention, prior tracheostomy stoma. Lungs: Very distant breath sounds, poor air movement, prolonged expiratory phase, without wheezes, rhonchi or rales. Heart: Very  distant heart sounds without obvious abnormality although poorly auscultated even subxiphoid. Abdomen: Soft, nontender, active bowel sounds, with active muscle contraction upon exhalation. Extremities: Without clubbing or edema. Neurologic: Alert and oriented x 3. Appropriately asks for family and knows name and phone numbers. Strength and sensation intact throughout.  Lab results:  Basic Metabolic Panel:  Basename 06/04/11 0127 06/03/11 2004 06/03/11 1744  NA 139 -- 139  K 4.0 -- 4.2  CL 105 -- 102  CO2 25 -- 28  GLUCOSE 129* -- 107*  BUN 17 -- 16  CREATININE 0.46* -- 0.45*  CALCIUM 8.3* -- 8.8  MG -- 1.9 --  PHOS -- -- --   Liver Function Tests:  Basename 06/04/11 1000 06/03/11 1117  AST -- 111*  ALT -- 90*  ALKPHOS -- 163*  BILITOT -- 0.3  PROT -- 7.5  ALBUMIN 2.8* 3.6   CBC:  Basename 06/04/11 0127 06/03/11 1125 06/03/11 1117  WBC 15.1* -- 9.5  NEUTROABS -- -- 5.9  HGB 13.3 17.7* --  HCT 40.3 52.0* --  MCV 101.8* -- 101.3*  PLT 138* -- 217   Cardiac Enzymes:  Basename 06/04/11 1015 06/04/11 0126 06/03/11 2003  CKTOTAL 259* 289* 292*  CKMB 14.3* 16.6* 18.7*  CKMBINDEX -- -- --  TROPONINI 3.17* 3.97* 2.45*   Anemia Panel:  Basename 06/03/11 2004  VITAMINB12 992*  FOLATE --  FERRITIN --  TIBC --  IRON --  RETICCTPCT --   Coagulation:  Basename 06/03/11 1117  INR 1.17   Urine Drug Screen:  Negative  Alcohol Level:  Basename 06/03/11 1209  ETH <11   Misc. Labs:  Dilantin: 13 21/2 liter oxygen arterial blood gas: 7.36/51/64  Imaging results:  Ct Head Wo Contrast  06/03/2011  *RADIOLOGY REPORT*  Clinical Data: Altered mental status.  Seizure  CT HEAD WITHOUT CONTRAST  Technique:  Contiguous axial images were obtained from the base of the skull through the vertex without contrast.  Comparison: Multiple prior studies.  The most recent CT was March 15, 2011.  CT angiogram 07/28/2005  Findings: Prior right frontal craniotomy.  Right frontal  ventricular shunt catheter extends into the third ventricle and is unchanged.  Right lateral ventricle is decompressed and slit-like. Left lateral ventricle is mildly enlarged with mild enlargement of the left temporal horn.  This is stable.  The fourth ventricle is mildly enlarged and this is unchanged.  No midline shift.  Encephalomalacia right frontal lobe is stable.  Left frontal hyperintensity is unchanged.  This is associated with small calcifications.  This appears to be a chronic process and may be related to chronic hemorrhage related to a vascular malformation. This may be a small AVM or venous malformation.  Abnormal vasculature is present in this area on the CTA 07/28/2005.  No acute hemorrhage.  Hypodensity throughout the left cerebral white matter is stable and is compatible with chronic ischemia.  No acute infarct or mass.  Sinusitis with air-fluid levels in both maxillary sinuses.  IMPRESSION: No change from the prior CT.  The right lateral and third ventricles are decompressed with a catheter.  There is mild chronic enlargement of the left lateral ventricle and fourth ventricle.  Original Report Authenticated By: Camelia Phenes, M.D.   Dg Chest Port 1 View  06/03/2011  *RADIOLOGY REPORT*  Clinical Data: Respiratory distress  PORTABLE CHEST - 1 VIEW  Comparison: 02/13/2011  Findings: VP shunt tubing over the right hemithorax.  Heart size normal.  No effusion.  New interstitial and airspace infiltrates or edema in the left mid and lower lung and to a lesser at the right lung base.  Regional bones unremarkable. Bilateral rib fractures.  IMPRESSION:  1.  Asymmetric bibasilar infiltrates or atypical edema, left worse than right.  Original Report Authenticated By: Osa Craver, M.D.   Mr Brain Ltd W/o Cm  06/03/2011  *RADIOLOGY REPORT*  Clinical Data: Right-sided neglect.  Rule out CVA.  MRI HEAD WITHOUT CONTRAST  Technique:  Multiplanar, multiecho pulse sequences of the brain and surrounding  structures were obtained according to standard protocol without intravenous contrast.  Comparison: CT head 06/03/2011  Findings: Study was limited to diffusion weighted imaging only.  A complete study was not performed.  Right frontal shunt catheter is in place causing significant artifact.   Encephalomalacia in the frontal lobes bilaterally.  Possible small area of restricted diffusion in the left medial thalamus which may represent acute infarct.  Correlate with clinical symptoms.  IMPRESSION: Limited study.  Possible small area of acute infarct left medial thalamus.  Original Report Authenticated By: Camelia Phenes, M.D.    Other results: EKG:  At 10:58 AM 4 mm ST elevation in V3 and V4 with a 2 mm ST elevation in V5 and peaked T waves throughout the precordium. EKG: At 11:34 AM 2 mm ST elevation in V3 resolution of peaked T waves. EKG: At 8:47 PM 1 mm ST elevation in V3 through V5  Assessment & Plan by Problem:  Ms.  Prince is a 56 year old woman with a history of a seizure disorder, chronic obstructive pulmonary disease, and meningitis requiring a shunt who presents with a witnessed tonic-clonic seizure the morning after the [employer] party she attended where she drank a six pack of beer. Her Dilantin level is in the therapeutic range and she states she is compliant, there is no other precipitant of her seizure disorder at this time other than the heavy alcohol use the night before. She likely lowered her seizure threshold with the alcohol use and subsequently had seizures.  Her EKG upon admission was consistent with an ST elevation myocardial infarction although this was felt to be neurologic changes given her acute seizure. Subsequent troponins were positive suggesting she may have had an acute coronary event.  Her MRI suggested a possible left medial thalamus infarct which was not considered to be an acute CVA per neurology.  Her shortness of breath and hypoxemia as well as obstruction on exam  suggests she has a significant component of obstructive lung disease as the cause of her dyspnea.  This morning she is much more alert and is no longer post ictal.  She also is breathing much better.  Plan  1) Seizures: Will restart her antiepileptic medications and follow clinically.  2) Possible CVA: Will continue to work on risk factor modification by having social work discussed smoking cessation, and continued therapy for her hypertension.  3) COPD: We'll continue her bronchodilator regimen. We will also wean her oxygen to keep her O2 sats greater than 95%.  4) Possible ST elevation myocardial infarction: Cardiology has been consulted so they are aware of the patient. We will continue to cycle cardiac enzymes to make sure the troponins continue to fall. We will also obtain daily EKGs to assess for evolution. Finally we will obtain an echocardiogram to assess for any wall motion abnormalities. If she has wall motion abnormalities this is highly suggestive of an acute ST elevation myocardial infarction and cardiology will proceed with the workup as an outpatient when she is otherwise stabilized.  5) Aspiration pneumonia: It is unclear if Wanda Prince has an aspiration pneumonia. We will obtain a PA and lateral chest x-ray to better assess her lung fields. If there is no acute infiltrate the antibiotics will be stopped.  6) Disposition: We will transfer her out of the intensive care unit today to a step down unit or telemetry floor. If she continues to do well and is stable without any arrhythmias worsening of her pulmonary status, or seizures she will likely be ready for discharge in the next 48 hours.

## 2011-06-04 NOTE — Progress Notes (Signed)
Subjective:  C/O HA. Denies CP. Respirations appear to be mildly labored @ rest - she states this is her baseline  Objective: Tmax 102.0 Tpres 98.4 99/66, 98 (sinus), 24 (mildly labored), 95% on 3 lpm [place]   Intake/Output Summary (Last 24 hours) at 06/04/11 1219 Last data filed at 06/04/11 1100  Gross per 24 hour  Intake 1916.33 ml  Output    510 ml  Net 1406.33 ml   Physical Exam: Appears much older than actual age Mild increased WOB @ rest, O/W no overt distress HEENT WNL No JVD noted Hyperresonant to percussion throughout Markedly diminished BS throughout, no wheezes, no findings of consolidation NABS, soft, NT No edema, ext warm   Lab Results: Basic Metabolic Panel:  Lab 06/04/11 1610 06/03/11 2004 06/03/11 1744  NA 139 -- 139  K 4.0 -- 4.2  CL 105 -- 102  CO2 25 -- 28  GLUCOSE 129* -- 107*  BUN 17 -- 16  CREATININE 0.46* -- 0.45*  CALCIUM 8.3* -- 8.8  MG -- 1.9 --  PHOS -- -- --   Liver Function Tests:  Lab 06/04/11 1000 06/03/11 1117  AST -- 111*  ALT -- 90*  ALKPHOS -- 163*  BILITOT -- 0.3  PROT -- 7.5  ALBUMIN 2.8* 3.6   CBC:  Lab 06/04/11 0127 06/03/11 1125 06/03/11 1117  WBC 15.1* -- 9.5  NEUTROABS -- -- 5.9  HGB 13.3 17.7* --  HCT 40.3 52.0* --  MCV 101.8* -- 101.3*  PLT 138* -- 217   Cardiac Enzymes:  Lab 06/04/11 1015 06/04/11 0126 06/03/11 2003  CKTOTAL 259* 289* 292*  CKMB 14.3* 16.6* 18.7*  CKMBINDEX -- -- --  TROPONINI 3.17* 3.97* 2.45*   Coagulation:  Lab 06/03/11 1117  LABPROT 15.1  INR 1.17   Anemia Panel:  Lab 06/03/11 2004  VITAMINB12 992*  FOLATE --  FERRITIN --  TIBC --  IRON --  RETICCTPCT --    CXR: Severe hyperinflation, no definite acute infiltrate   Assessment/Plan:  1. Epilepsy s/p seizure: likely related to EtOH w/d after binge drinking. Mgmt per MTSB and Neuro  2. Severe COPD, emphysema - per her report, approx @ baseline. Cont nebulized BDs and supplemental O2. Needs counseling re: smoking  cessation and evaluation for need of home O2 after discharge. But, I would not prescribe home O2 unless we can be convinced that she has will stop smoking  3. STEMI: mgmt per primary team and Cards. I would think that any intervention, whether invasive or medical will be futile in the long run unless she makes substantial lifestyle changes, most importantly smoking cessation  4. Isolated Fever - Has resolved. There are no overt findings on exam nor on her most recent CXR that are compelling for the dx of PNA. In general, an isolated fever in the ICU setting without signs, symptoms or investigations to suggest a specific site of infection and not associated with physiological instability does not require empiric antibiotics. Suggest D/C of abx. We could check procalcitonin which is thought to be a very sensitive marker for bacterial PNA and if it is less than 0.5, abx therapy for PNA is not indicated  It appears that she can be safely transferred to SDU or even Tele if OK with Neuro. She is not presently receiving any therapies that require ICU level of care. There is little that PCCM service has to offer. We will check in on her each day that she remains in the ICU but generally not  interfere with her day to day care unless her condition deteriorates such that we can be of assistance    LOS: 1 day   Billy Fischer 06/04/2011, 12:19 PM

## 2011-06-04 NOTE — Progress Notes (Signed)
Resident Addendum to Medical Student Note   I have seen and examined the patient, and agree with the the medical student assessment and plan outlined above. Please see my brief note below for additional details.  S:  No further seizure activity noted overnight, patient more lucid today   OBJECTIVE: VS: Reviewed  Meds: Reviewed  Labs: Reviewed  Imaging: Reviewed   Physical Exam: General: Vital signs reviewed and noted. Well-developed, well-nourished, in no acute distress; alert, appropriate and cooperative throughout examination.  HEENT: Normocephalic, atraumatic  Lungs:  Normal respiratory effort. Clear to auscultation BL without crackles or wheezes.  Heart: RRR. S1 and S2 normal without gallop, murmur, or rubs.  Abdomen:  BS normoactive. Soft, Nondistended, non-tender.  No masses or organomegaly.  Extremities: No pretibial edema.     ASSESSMENT/ PLAN: Pt is a 56 y.o. yo female  Admitted with seizures in setting likely TIA and STEMI.   1. Epilepsy s/p seizure: currently controlled on Dilantin and Keppra, Neurology following, risk stratified with addition of Aspirin and simvastatin given TIA, appears fidgety c/w initial exam with patient reporting daily beer intake and ~1/2 ppd cigarettes, UCX and BCX wgtd x1day PLAN  -transfer to SDU -cont Dilantin  -cont Keppra  -cont CIWA protocol  -start Nicotine patch 21 mg daily  2. Respiratory Distress: in setting of COPD, resolved, currently oxygen sat stable at 95% 2L PLAN -CXR PA/Lat pending to further assess possible asp pna -titrate O2 down for goal 95% in setting of STEMI -cont Spiriva and advair PRN   3. TIA?: symptoms resolved within a few hours PLAN  -continue close monitoring -start aspirin therapy 81mg  qd  4. STEMI: anterior, Hep deferred in setting of TIA, ECHO pending to assess wall function, if normal likely CVA contributed to ischemic event, if abnormal will consult Cardiology for further assessment and  management PLAN  -cont to cycle cardiac enzymes  -cont daily EKG -notify Cardiology for possible future cath   5. Polysubstance abuse: pt amenable to tobacco cessation today, reports being on "Disability" as sole source of support PLAN  -social work consult  -start Nicotine patch as above  Length of Stay: 1   Kristie Cowman, MD PGYI, Internal Medicine Resident 06/04/2011, 12:12 PM

## 2011-06-04 NOTE — Progress Notes (Signed)
Subjective:  Patient had a fever of 102 at 11pm last night.  Her CXR showed questionable aspiration pneumonia.  Night float team started her on Zosyn.  Her fever subsided after tylenol.  Patient had made noticable improvement clinically overnight.  This morning, she was alert and oriented.  She was able to answer all questions appropriately.  She did not appear to be in respiratory distress.  She complained of a HA but denied chest pain and SOB.    Objective: Vital signs in last 24 hours: Filed Vitals:   06/04/11 0500 06/04/11 0600 06/04/11 0700 06/04/11 0800  BP: 96/70 92/67 92/63  95/67  Pulse: 101 92 99 96  Temp: 99.3 F (37.4 C) 99.9 F (37.7 C) 99.1 F (37.3 C) 99.7 F (37.6 C)  TempSrc: Core (Comment) Core (Comment) Core (Comment) Core (Comment)  Resp: 23 24 20 23   Height:      Weight:      SpO2: 93% 97% 95% 93%    Intake/Output Summary (Last 24 hours) at 06/04/11 0912 Last data filed at 06/04/11 0800  Gross per 24 hour  Intake 1412.33 ml  Output    435 ml  Net 977.33 ml   Physical Exam:  Constitutional: Vital signs reviewed.  Patient was in no acute distress and cooperative with exam. Alert and oriented x3, improved dramatically from yesterday.  Head: Normocephalic and atraumatic Eyes: PERRL, EOMI, conjunctivae normal, No scleral icterus.  Neck: Supple, Trachea midline normal ROM, No JVD, mass, thyromegaly, or carotid bruit present.  Cardiovascular: Tachycardic. Heart sound appreciated, but S1, S2 indistinguishable.  Pulmonary/Chest: decreased breath sound bilaterally, improved from yesterday.  No wheezes, rales, or rhonchi. Abdominal: Soft. Non-tender, non-distended, bowel sounds are normal. Neurological: A&O x3, cranial nerve II-XII are grossly intact, no focal motor deficit, sensory intact to light touch bilaterally.  Skin: Warm, dry and intact. No rash, cyanosis, or clubbing.  Psychiatric: Normal mood and affect. speech and behavior is normal. Cognition and memory  are normal.    Lab Results: Basic Metabolic Panel:  Lab 06/04/11 4098 06/03/11 2004 06/03/11 1744  NA 139 -- 139  K 4.0 -- 4.2  CL 105 -- 102  CO2 25 -- 28  GLUCOSE 129* -- 107*  BUN 17 -- 16  CREATININE 0.46* -- 0.45*  CALCIUM 8.3* -- 8.8  MG -- 1.9 --  PHOS -- -- --   Liver Function Tests:  Lab 06/03/11 1117  AST 111*  ALT 90*  ALKPHOS 163*  BILITOT 0.3  PROT 7.5  ALBUMIN 3.6   CBC:  Lab 06/04/11 0127 06/03/11 1125 06/03/11 1117  WBC 15.1* -- 9.5  NEUTROABS -- -- 5.9  HGB 13.3 17.7* --  HCT 40.3 52.0* --  MCV 101.8* -- 101.3*  PLT 138* -- 217   Cardiac Enzymes:  Lab 06/04/11 0126 06/03/11 2003 06/03/11 1821  CKTOTAL 289* 292* 258*  CKMB 16.6* 18.7* 17.5*  CKMBINDEX -- -- --  TROPONINI 3.97* 2.45* 4.12*   Coagulation:  Lab 06/03/11 1117  LABPROT 15.1  INR 1.17   Anemia Panel:  Lab 06/03/11 2004  VITAMINB12 992*  FOLATE --  FERRITIN --  TIBC --  IRON --  RETICCTPCT --   Urine Drug Screen: Drugs of Abuse     Component Value Date/Time   LABOPIA NONE DETECTED 06/03/2011 1204   LABOPIA NEGATIVE 02/08/2011 0732   COCAINSCRNUR NONE DETECTED 06/03/2011 1204   COCAINSCRNUR NEGATIVE 02/08/2011 0732   LABBENZ POSITIVE* 06/03/2011 1204   LABBENZ POSITIVE* 02/08/2011 0732   AMPHETMU  NONE DETECTED 06/03/2011 1204   AMPHETMU NEGATIVE 02/08/2011 0732   THCU NONE DETECTED 06/03/2011 1204   LABBARB NONE DETECTED 06/03/2011 1204    Alcohol Level:  Lab 06/03/11 1209  ETH <11   Urinalysis:  Lab 06/04/11 0122 06/03/11 1204  COLORURINE AMBER* AMBER*  LABSPEC 1.029 1.026  PHURINE 5.5 6.0  GLUCOSEU NEGATIVE NEGATIVE  HGBUR NEGATIVE NEGATIVE  BILIRUBINUR SMALL* SMALL*  KETONESUR 15* NEGATIVE  PROTEINUR 30* 30*  UROBILINOGEN 1.0 1.0  NITRITE NEGATIVE NEGATIVE  LEUKOCYTESUR NEGATIVE NEGATIVE    Micro Results: Recent Results (from the past 240 hour(s))  MRSA PCR SCREENING     Status: Normal   Collection Time   06/03/11  8:09 PM      Component Value Range  Status Comment   MRSA by PCR NEGATIVE  NEGATIVE  Final    Studies/Results: Ct Head Wo Contrast  06/03/2011  *RADIOLOGY REPORT*  Clinical Data: Altered mental status.  Seizure  CT HEAD WITHOUT CONTRAST  Technique:  Contiguous axial images were obtained from the base of the skull through the vertex without contrast.  Comparison: Multiple prior studies.  The most recent CT was 2011/02/18.  CT angiogram 07/28/2005  Findings: Prior right frontal craniotomy.  Right frontal ventricular shunt catheter extends into the third ventricle and is unchanged.  Right lateral ventricle is decompressed and slit-like. Left lateral ventricle is mildly enlarged with mild enlargement of the left temporal horn.  This is stable.  The fourth ventricle is mildly enlarged and this is unchanged.  No midline shift.  Encephalomalacia right frontal lobe is stable.  Left frontal hyperintensity is unchanged.  This is associated with small calcifications.  This appears to be a chronic process and may be related to chronic hemorrhage related to a vascular malformation. This may be a small AVM or venous malformation.  Abnormal vasculature is present in this area on the CTA 07/28/2005.  No acute hemorrhage.  Hypodensity throughout the left cerebral white matter is stable and is compatible with chronic ischemia.  No acute infarct or mass.  Sinusitis with air-fluid levels in both maxillary sinuses.  IMPRESSION: No change from the prior CT.  The right lateral and third ventricles are decompressed with a catheter.  There is mild chronic enlargement of the left lateral ventricle and fourth ventricle.  Original Report Authenticated By: Camelia Phenes, M.D.   Dg Chest Port 1 View  06/03/2011  *RADIOLOGY REPORT*  Clinical Data: Respiratory distress  PORTABLE CHEST - 1 VIEW  Comparison: 02/13/2011  Findings: VP shunt tubing over the right hemithorax.  Heart size normal.  No effusion.  New interstitial and airspace infiltrates or edema in the left mid and  lower lung and to a lesser at the right lung base.  Regional bones unremarkable. Bilateral rib fractures.  IMPRESSION:  1.  Asymmetric bibasilar infiltrates or atypical edema, left worse than right.  Original Report Authenticated By: Osa Craver, M.D.   Mr Brain Ltd W/o Cm  06/03/2011  *RADIOLOGY REPORT*  Clinical Data: Right-sided neglect.  Rule out CVA.  MRI HEAD WITHOUT CONTRAST  Technique:  Multiplanar, multiecho pulse sequences of the brain and surrounding structures were obtained according to standard protocol without intravenous contrast.  Comparison: CT head 06/03/2011  Findings: Study was limited to diffusion weighted imaging only.  A complete study was not performed.  Right frontal shunt catheter is in place causing significant artifact.   Encephalomalacia in the frontal lobes bilaterally.  Possible small area of restricted diffusion in the  left medial thalamus which may represent acute infarct.  Correlate with clinical symptoms.  IMPRESSION: Limited study.  Possible small area of acute infarct left medial thalamus.  Original Report Authenticated By: Camelia Phenes, M.D.   Medications:   I have reviewed patient's medication. Scheduled Meds:   . albuterol  2.5 mg Nebulization Q4H   And  . ipratropium  0.5 mg Nebulization Q4H  . albuterol      . aspirin  300 mg Rectal Once  . heparin subcutaneous  5,000 Units Subcutaneous Q8H  . ipratropium      . levetiracetam  500 mg Intravenous Q12H  . LORazepam      . metoprolol  2.5 mg Intravenous Q6H  . nicotine  21 mg Transdermal Q24H  . pantoprazole (PROTONIX) IV  40 mg Intravenous QHS  . phenytoin (DILANTIN) IV  200 mg Intravenous BID  . piperacillin-tazobactam (ZOSYN)  IV  3.375 g Intravenous Q8H  . DISCONTD: enoxaparin  40 mg Subcutaneous QHS   Continuous Infusions:   . sodium chloride 100 mL/hr at 06/04/11 0800   PRN Meds:.sodium chloride, acetaminophen, albuterol, ipratropium, labetalol, LORazepam  Assessment/Plan:  1.  Epilepsy s/p seizure: Patient has a substantial history of neurologic diseases such as epilepsy, meningitis, subependymal abscess status post ventricular shunt, and craniotomy.  Pt's Dilantin level is therapeutic but given reported history of recent alcohol consumption, this had likely lowered her seizure threshold level, leading to seizure. Pt experienced witnessed seizure on exam now s/p Ativan 2 mg with resolution of seizure. Likely etiology of recurrenct seizure could be alcohol withdrawal. Although she has a history of cocaine abuse her urine drug screen was negative for cocaine. TIA is high on differential given CT and MRI findings.  PLAN  -Admit to neuro ICU  -continue home dose of Dilantin  -started Keppra 500mg  on 2/4 -ASA and simvastatin given TIA -CIWA protocol -Thiamine, folate given alcohol Hx. -Neurology on consulting, appreciate recommendations  -Seizure precautions  -blood culture x2   2. Respiratory Distress: Pt exhibiting labored breathing on exam with prolonged expiratory phase. This is probably secondary to either STEMI or TIA.  This condition has resolved by the morning of 2/5. Hx significant for COPD with respiratory failure thus threshold for intubation is low in this case.  On admission, she oxygenated adequately with O2>90% on 2.5 L oxygen. O2 was increased to 4Ls overnight.  ABG showed respiratory acidosis with metabolic compensation. Since her respiratory distress has resolved, we will consider moving her to a stepdown bed.  Portable CXR showed questionable aspiration pneumonia.  Due to high likelihood for aspiration penumo based on her seizure history, she was started on Zosyn on 2/5.  Further assessment will take place with additional info from PA and Lat. CXR.  PLAN  -PA and Lat CXR-pending -Wean O2 if possible to keep O2 sat at 95% due to STEMI -Spiriva and advair PRN -PPI ppx for stress ulcer prophylasix   3.Question of CVA: new stroke in not definite on imagining.   Neurology has assessed patient. She is not a tPA candidate given on-going seizure activity. Patient has probably had a TIA, instead of a CVA due to the resolution of her right-sided weakness and preferential gaze.  Patient has been stable.    PLAN  -continue close monitoring, appreciate neurology recommendations   4. STEMI: Patient presented to the ED with ST elevation in V3~V5. Troponin was negative initially but had become positive in subsequent labs, 4.12--> 2.45--> 3.97.  Patient's last echo was  done in Oct 2012, which showed EF 65% with no wall motion abnormalities.  Given TIA, cardiac cath and heparin are not indicated.  Management at this point is restricted to repeat echo to assess heart damage and cycle CE.    PLAN  -Repeat Echo -cycle cardiac enzymes  -EKG in am  -Cannot give heparin drip due to TIA.  -D/C Lovenox -notify Cardiology for possible future cath   5. Polysubstance abuse: pt continues to smoke cigarettes and drink alcohol  PLAN  -social work consult     LOS: 1 day   Maren Beach 06/04/2011, 9:12 AM

## 2011-06-04 NOTE — Progress Notes (Signed)
ANTIBIOTIC CONSULT NOTE - INITIAL  Pharmacy Consult for Zosyn Indication: possible aspiration pneumonia  No Known Allergies  Patient Measurements: Height: 5\' 5"  (165.1 cm) Weight: 111 lb 8.8 oz (50.6 kg) IBW/kg (Calculated) : 57    Vital Signs: Temp: 102 F (38.9 C) (02/05 0000) Temp src: Core (Comment) (02/05 0000) BP: 101/70 mmHg (02/05 0000) Pulse Rate: 109  (02/05 0000) Intake/Output from previous day: 02/04 0701 - 02/05 0700 In: 562.3 [I.V.:343.3; IV Piggyback:219] Out: 180 [Urine:180] Intake/Output from this shift: Total I/O In: 562.3 [I.V.:343.3; IV Piggyback:219] Out: 180 [Urine:180]  Labs:  Basename 06/03/11 1744 06/03/11 1125 06/03/11 1117  WBC -- -- 9.5  HGB -- 17.7* 15.3*  PLT -- -- 217  LABCREA -- -- --  CREATININE 0.45* 0.80 0.60   Estimated Creatinine Clearance: 65 ml/min (by C-G formula based on Cr of 0.45). No results found for this basename: VANCOTROUGH:2,VANCOPEAK:2,VANCORANDOM:2,GENTTROUGH:2,GENTPEAK:2,GENTRANDOM:2,TOBRATROUGH:2,TOBRAPEAK:2,TOBRARND:2,AMIKACINPEAK:2,AMIKACINTROU:2,AMIKACIN:2, in the last 72 hours   Microbiology: Recent Results (from the past 720 hour(s))  MRSA PCR SCREENING     Status: Normal   Collection Time   06/03/11  8:09 PM      Component Value Range Status Comment   MRSA by PCR NEGATIVE  NEGATIVE  Final     Medical History: Past Medical History  Diagnosis Date  . Meningitis 2009  . Hx of ventricular shunt 2009  . Seizure disorder   . 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 Suicide attempt     History of suicide attempts August 2006 and July 2006  . History of alcohol abuse   . History of cocaine abuse   . Major depressive disorder     History of  . History of iron deficiency   . COPD (chronic obstructive pulmonary disease)   . History of acute alcoholic hepatitis   . Psychiatric diagnosis     Multiple  .  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 albuterol (PROVENTIL HFA;VENTOLIN HFA) 108 (90 BASE) MCG/ACT inhaler Inhale 1 puff into the lungs every 6 (six) hours as needed. For shortness of breath.      Marland Kitchen albuterol (PROVENTIL) (2.5 MG/3ML) 0.083% nebulizer solution Take 2.5 mg by nebulization 3 (three) times daily as needed. For shortness of breath.      . benazepril (LOTENSIN) 20 MG tablet Take 20 mg by mouth daily.        Marland Kitchen docusate sodium (COLACE) 100 MG capsule Take 100 mg by mouth 3 (three) times daily as needed. For constipation.      . Fluticasone-Salmeterol (ADVAIR) 250-50 MCG/DOSE AEPB Inhale 1 puff into the lungs every 12 (twelve) hours.        . folic acid (FOLVITE) 1 MG tablet Take 1 mg by mouth daily.        . hydrochlorothiazide (MICROZIDE) 12.5 MG capsule Take 12.5 mg by mouth daily.        Marland Kitchen ibuprofen (ADVIL,MOTRIN) 600 MG tablet Take 600 mg by mouth every 6 (six) hours as needed. For pain.      Marland Kitchen ipratropium-albuterol (DUONEB) 0.5-2.5 (3) MG/3ML SOLN Take 3 mLs by nebulization 3 (three) times daily as needed. For shortness of breath.      . nicotine (NICODERM CQ - DOSED IN MG/24 HOURS) 21 mg/24hr patch Place  1 patch onto the skin daily.        . phenytoin (DILANTIN) 200 MG ER capsule Take 200 mg by mouth 2 (two) times daily.        Marland Kitchen thiamine 100 MG tablet Take 100 mg by mouth daily.        Marland Kitchen tiotropium (SPIRIVA) 18 MCG inhalation capsule Place 18 mcg into inhaler and inhale daily.        Marland Kitchen VITAMIN D, ERGOCALCIFEROL, PO Take 1 tablet by mouth daily.         Assessment: 56 y.o. Female presented s/p tonic clonic seizure. Pt with h/o seizures and therapeutic phenytoin level. Now to begin Zosyn for possible aspiration pneumonia. Fever up to 102. Cultures pending.  Goal of Therapy:  Eradication of infection  Plan:  1. Zosyn 3.375gm IV q8h. Each  dose over 4 hours.  2. F/u renal function and cultures  Willliam Pettet, Hilario Quarry, PharmD, BCPS Clinical pharmacist, pager [phone] 06/04/2011,1:03 AM

## 2011-06-04 NOTE — Progress Notes (Signed)
Speech Language/Pathology  Swallow assessment completed.  Increased work of breathing using accessory muscles after volitional cough.  Initial cough with first sip water and one slight cough after applesauce which felt likely due to coughing episode prior to po's.  Full report will be documented (see for full details). RECOMMEND:  Regular texture diet  And thin liquids, educated pt. To eat/drink at slow pace, take rest breaks due to COPD.  Breck Coons [place].Ed [employer] [phone]  06/04/2011

## 2011-06-05 ENCOUNTER — Encounter (HOSPITAL_COMMUNITY): Payer: Self-pay | Admitting: Cardiovascular Disease

## 2011-06-05 DIAGNOSIS — I429 Cardiomyopathy, unspecified: Secondary | ICD-10-CM | POA: Diagnosis present

## 2011-06-05 DIAGNOSIS — I214 Non-ST elevation (NSTEMI) myocardial infarction: Secondary | ICD-10-CM | POA: Diagnosis present

## 2011-06-05 DIAGNOSIS — F17200 Nicotine dependence, unspecified, uncomplicated: Secondary | ICD-10-CM

## 2011-06-05 DIAGNOSIS — F1010 Alcohol abuse, uncomplicated: Secondary | ICD-10-CM

## 2011-06-05 DIAGNOSIS — I428 Other cardiomyopathies: Secondary | ICD-10-CM

## 2011-06-05 LAB — TROPONIN I: Troponin I: 1.29 ng/mL (ref <0.30)

## 2011-06-05 LAB — BASIC METABOLIC PANEL
BUN: 14 mg/dL (ref 6–23)
CO2: 24 mEq/L (ref 19–32)
Calcium: 8.5 mg/dL (ref 8.4–10.5)
Chloride: 107 mEq/L (ref 96–112)
Creatinine, Ser: 0.38 mg/dL — ABNORMAL LOW (ref 0.50–1.10)
GFR calc Af Amer: >90 mL/min (ref >90)
GFR calc non Af Amer: >90 mL/min (ref >90)
Glucose, Bld: 139 mg/dL — ABNORMAL HIGH (ref 70–99)
Potassium: 3.5 mEq/L (ref 3.5–5.1)
Sodium: 140 mEq/L (ref 135–145)

## 2011-06-05 LAB — CBC
HCT: 36.4 % (ref 36.0–46.0)
Hemoglobin: 12 g/dL (ref 12.0–15.0)
MCH: 33.9 pg (ref 26.0–34.0)
MCHC: 33 g/dL (ref 30.0–36.0)
MCV: 102.8 fL — ABNORMAL HIGH (ref 78.0–100.0)
Platelets: 107 10*3/uL — ABNORMAL LOW (ref 150–400)
RBC: 3.54 MIL/uL — ABNORMAL LOW (ref 3.87–5.11)
RDW: 13.3 % (ref 11.5–15.5)
WBC: 6.3 10*3/uL (ref 4.0–10.5)

## 2011-06-05 LAB — CULTURE, BLOOD (ROUTINE X 2)
Culture  Setup Time: 201302050819
Culture: NO GROWTH

## 2011-06-05 MED ORDER — PHENYTOIN SODIUM EXTENDED 100 MG PO CAPS
200.0000 mg | ORAL_CAPSULE | Freq: Two times a day (BID) | ORAL | Status: DC
  Administered 2011-06-05 – 2011-06-07 (×5): 200 mg via ORAL
  Filled 2011-06-05 (×9): qty 2

## 2011-06-05 MED ORDER — ENSURE CLINICAL ST REVIGOR PO LIQD
237.0000 mL | Freq: Two times a day (BID) | ORAL | Status: DC
  Administered 2011-06-05 – 2011-06-07 (×3): 237 mL via ORAL

## 2011-06-05 MED ORDER — METOPROLOL TARTRATE 50 MG PO TABS
50.0000 mg | ORAL_TABLET | Freq: Two times a day (BID) | ORAL | Status: DC
  Administered 2011-06-05 – 2011-06-07 (×4): 50 mg via ORAL
  Filled 2011-06-05 (×7): qty 1

## 2011-06-05 MED ORDER — SODIUM CHLORIDE 0.9 % IJ SOLN
3.0000 mL | Freq: Two times a day (BID) | INTRAMUSCULAR | Status: DC
  Administered 2011-06-05 – 2011-06-06 (×2): 3 mL via INTRAVENOUS

## 2011-06-05 MED ORDER — LEVETIRACETAM 500 MG PO TABS
500.0000 mg | ORAL_TABLET | Freq: Every day | ORAL | Status: DC
  Administered 2011-06-05 – 2011-06-07 (×3): 500 mg via ORAL
  Filled 2011-06-05 (×4): qty 1

## 2011-06-05 MED ORDER — ASPIRIN 81 MG PO CHEW
324.0000 mg | CHEWABLE_TABLET | ORAL | Status: AC
  Administered 2011-06-06: 324 mg via ORAL
  Filled 2011-06-05: qty 4

## 2011-06-05 MED ORDER — PANTOPRAZOLE SODIUM 40 MG PO TBEC
40.0000 mg | DELAYED_RELEASE_TABLET | Freq: Every day | ORAL | Status: DC
  Administered 2011-06-05 – 2011-06-07 (×3): 40 mg via ORAL
  Filled 2011-06-05 (×3): qty 1

## 2011-06-05 MED ORDER — DIAZEPAM 5 MG PO TABS
5.0000 mg | ORAL_TABLET | ORAL | Status: AC
  Administered 2011-06-06: 5 mg via ORAL
  Filled 2011-06-05: qty 1

## 2011-06-05 MED ORDER — SODIUM CHLORIDE 0.9 % IV SOLN
INTRAVENOUS | Status: DC
  Administered 2011-06-06: 04:00:00 via INTRAVENOUS

## 2011-06-05 MED ORDER — SODIUM CHLORIDE 0.9 % IV SOLN: 250.0000 mL | INTRAVENOUS | Status: DC | PRN

## 2011-06-05 MED ORDER — SODIUM CHLORIDE 0.9 % IJ SOLN: 3.0000 mL | INTRAMUSCULAR | Status: DC | PRN

## 2011-06-05 NOTE — Progress Notes (Signed)
Pt's heart rate has converted from NSR, to NSR with occasional PVC's. Pt is asymptomatic and MD is aware of abnormal EKG's. Pt is going for cardiac cath tomorrow morning. Plan to continue to monitor.

## 2011-06-05 NOTE — Progress Notes (Signed)
Subjective:  Overnight, patient had abdominal fullness which resolved after removal of urine by cath.  She had no other events overnight. She appeared to be in respiratory distress this morning, breathing using accessory muscles.  She also appeared very fidgety, probably due to nicotine withdraw.  She was successfully weaned off of oxygen and her O2 sat was 95% on room air. She denied HA, chest pain, and SOB.    Objective: Vital signs in last 24 hours: Filed Vitals:   06/05/11 0842 06/05/11 0900 06/05/11 1000 06/05/11 1100  BP:      Pulse:  90 97 89  Temp:      TempSrc:      Resp:  23 27 24   Height:      Weight:      SpO2: 95% 94% 94% 97%    Intake/Output Summary (Last 24 hours) at 06/05/11 1158 Last data filed at 06/05/11 1100  Gross per 24 hour  Intake   3129 ml  Output    581 ml  Net   2548 ml   Physical Exam:  Constitutional: Vital signs reviewed.  Patient was in moderate distress, breathing using accessory muscles. Patient appeared fidgety during exam. Head: Normocephalic and atraumatic Eyes: PERRL, EOMI, conjunctivae normal, No scleral icterus.  Neck: Supple, Trachea midline normal ROM, No JVD, mass, thyromegaly, or carotid bruit present.  Cardiovascular: Heart sound appreciated, but S1, S2 indistinguishable.  Pulmonary/Chest: significantly decreased breath sounds bilaterally.  No wheezes, rales, or rhonchi. Abdominal: Soft. Non-tender, non-distended, bowel sounds are normal. Neurological: A&O x3, cranial nerve II-XII are grossly intact, no focal motor deficit, sensory intact to light touch bilaterally.  Skin: Warm, dry and intact. No rash, cyanosis, or clubbing.  Psychiatric: Normal mood and affect. speech and behavior is normal. Cognition and memory are normal.    Lab Results: Basic Metabolic Panel:  Lab 06/05/11 5409 06/04/11 0127 06/03/11 2004  NA 140 139 --  K 3.5 4.0 --  CL 107 105 --  CO2 24 25 --  GLUCOSE 139* 129* --  BUN 14 17 --  CREATININE  0.38* 0.46* --  CALCIUM 8.5 8.3* --  MG -- -- 1.9  PHOS -- -- --   Liver Function Tests:  Lab 06/04/11 1000 06/03/11 1117  AST -- 111*  ALT -- 90*  ALKPHOS -- 163*  BILITOT -- 0.3  PROT -- 7.5  ALBUMIN 2.8* 3.6   CBC:  Lab 06/05/11 0930 06/04/11 0127 06/03/11 1117  WBC 6.3 15.1* --  NEUTROABS -- -- 5.9  HGB 12.0 13.3 --  HCT 36.4 40.3 --  MCV 102.8* 101.8* --  PLT 107* 138* --   Cardiac Enzymes:  Lab 06/05/11 0930 06/04/11 1015 06/04/11 0126 06/03/11 2003  CKTOTAL -- 259* 289* 292*  CKMB -- 14.3* 16.6* 18.7*  CKMBINDEX -- -- -- --  TROPONINI 1.29* 3.17* 3.97* --   Coagulation:  Lab 06/03/11 1117  LABPROT 15.1  INR 1.17   Anemia Panel:  Lab 06/03/11 2004  VITAMINB12 992*  FOLATE --  FERRITIN --  TIBC --  IRON --  RETICCTPCT --   Urine Drug Screen: Drugs of Abuse     Component Value Date/Time   LABOPIA NONE DETECTED 06/03/2011 1204   LABOPIA NEGATIVE 02/08/2011 0732   COCAINSCRNUR NONE DETECTED 06/03/2011 1204   COCAINSCRNUR NEGATIVE 02/08/2011 0732   LABBENZ POSITIVE* 06/03/2011 1204   LABBENZ POSITIVE* 02/08/2011 0732   AMPHETMU NONE DETECTED 06/03/2011 1204   AMPHETMU NEGATIVE 02/08/2011 0732   THCU NONE DETECTED  06/03/2011 1204   LABBARB NONE DETECTED 06/03/2011 1204    Alcohol Level:  Lab 06/03/11 1209  ETH <11   Urinalysis:  Lab 06/04/11 0122 06/03/11 1204  COLORURINE AMBER* AMBER*  LABSPEC 1.029 1.026  PHURINE 5.5 6.0  GLUCOSEU NEGATIVE NEGATIVE  HGBUR NEGATIVE NEGATIVE  BILIRUBINUR SMALL* SMALL*  KETONESUR 15* NEGATIVE  PROTEINUR 30* 30*  UROBILINOGEN 1.0 1.0  NITRITE NEGATIVE NEGATIVE  LEUKOCYTESUR NEGATIVE NEGATIVE    Micro Results: Recent Results (from the past 240 hour(s))  CULTURE, BLOOD (ROUTINE X 2)     Status: Normal (Preliminary result)   Collection Time   06/03/11  6:34 PM      Component Value Range Status Comment   Specimen Description BLOOD HAND LEFT   Final    Special Requests     Final    Value: BOTTLES DRAWN AEROBIC  AND ANAEROBIC 4CC RED 8CC BLUE   Culture  Setup Time [id number]   Final    Culture     Final    Value:        BLOOD CULTURE RECEIVED NO GROWTH TO DATE CULTURE WILL BE HELD FOR 5 DAYS BEFORE ISSUING A FINAL NEGATIVE REPORT   Report Status PENDING   Incomplete   CULTURE, BLOOD (ROUTINE X 2)     Status: Normal (Preliminary result)   Collection Time   06/03/11  6:36 PM      Component Value Range Status Comment   Specimen Description BLOOD HAND RIGHT   Final    Special Requests BOTTLES DRAWN AEROBIC AND ANAEROBIC 10CC EA   Final    Culture  Setup Time [id number]   Final    Culture     Final    Value:        BLOOD CULTURE RECEIVED NO GROWTH TO DATE CULTURE WILL BE HELD FOR 5 DAYS BEFORE ISSUING A FINAL NEGATIVE REPORT   Report Status PENDING   Incomplete   MRSA PCR SCREENING     Status: Normal   Collection Time   06/03/11  8:09 PM      Component Value Range Status Comment   MRSA by PCR NEGATIVE  NEGATIVE  Final   CULTURE, BLOOD (ROUTINE X 2)     Status: Normal   Collection Time   06/04/11  1:00 AM      Component Value Range Status Comment   Specimen Description BLOOD RIGHT ARM   Final    Special Requests BOTTLES DRAWN AEROBIC AND ANAEROBIC 5CC EA   Final    Culture  Setup Time [id number]   Final    Culture NO GROWTH 5 DAYS   Final    Report Status 06/05/2011 FINAL   Final   CULTURE, BLOOD (ROUTINE X 2)     Status: Normal (Preliminary result)   Collection Time   06/04/11  1:05 AM      Component Value Range Status Comment   Specimen Description BLOOD RIGHT HAND   Final    Special Requests BOTTLES DRAWN AEROBIC AND ANAEROBIC 5CC EA   Final    Culture  Setup Time [id number]   Final    Culture     Final    Value:        BLOOD CULTURE RECEIVED NO GROWTH TO DATE CULTURE WILL BE HELD FOR 5 DAYS BEFORE ISSUING A FINAL NEGATIVE REPORT   Report Status PENDING   Incomplete   URINE CULTURE     Status: Normal   Collection Time   06/04/11  1:23  AM      Component Value Range Status Comment    Specimen Description URINE, CATHETERIZED   Final    Special Requests NONE   Final    Culture  Setup Time [id number]   Final    Colony Count NO GROWTH   Final    Culture NO GROWTH   Final    Report Status 06/04/2011 FINAL   Final    Studies/Results: Dg Chest 2 View  06/04/2011  *RADIOLOGY REPORT*  Clinical Data: Cough with shortness of breath.  Evaluate for aspiration pneumonia  CHEST - 2 VIEW  Comparison: 06/03/2011 and 02/08/2011  Findings: Changes of underlying COPD are identified with a stable degree of hyperinflation.  Heart size is within normal limits and a stable mediastinal contour is seen.  The lung fields are notable for increased interstitial markings involving the posterior and apical segments of the left lower lobe. Interstitial septal lines, mild prominence of the fissures and a small left pleural effusion are noted. Some mild improvement is noted in comparison with the prior exam and these findings as well as some increased delineation to the vascular markings in the right hemithorax suggests that this may be the result of improving asymmetric pulmonary edema rather than due to an infiltrative process. This would be even more suspicious is if the patient typically lies with the left side down. An area of left lower lobe pneumonia is not completely ruled out and continued follow-up to clearing would be recommended.  Bony structures appear intact with stable healed left-sided rib fractures.  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  As left lower lobe pneumonia could still be present, follow-up to clearing is recommended.  Original Report Authenticated By: Bertha Stakes, M.D.   Dg Chest Port 1 View  06/03/2011  *RADIOLOGY REPORT*  Clinical Data: Respiratory distress  PORTABLE CHEST - 1 VIEW  Comparison: 02/13/2011   Findings: VP shunt tubing over the right hemithorax.  Heart size normal.  No effusion.  New interstitial and airspace infiltrates or edema in the left mid and lower lung and to a lesser at the right lung base.  Regional bones unremarkable. Bilateral rib fractures.  IMPRESSION:  1.  Asymmetric bibasilar infiltrates or atypical edema, left worse than right.  Original Report Authenticated By: Osa Craver, M.D.   Mr Brain Ltd W/o Cm  06/03/2011  *RADIOLOGY REPORT*  Clinical Data: Right-sided neglect.  Rule out CVA.  MRI HEAD WITHOUT CONTRAST  Technique:  Multiplanar, multiecho pulse sequences of the brain and surrounding structures were obtained according to standard protocol without intravenous contrast.  Comparison: CT head 06/03/2011  Findings: Study was limited to diffusion weighted imaging only.  A complete study was not performed.  Right frontal shunt catheter is in place causing significant artifact.   Encephalomalacia in the frontal lobes bilaterally.  Possible small area of restricted diffusion in the left medial thalamus which may represent acute infarct.  Correlate with clinical symptoms.  IMPRESSION: Limited study.  Possible small area of acute infarct left medial thalamus.  Original Report Authenticated By: Camelia Phenes, M.D.   Medications:   I have reviewed patient's medication. Scheduled Meds:    . albuterol  2.5 mg Nebulization Q4H   And  . ipratropium  0.5 mg Nebulization Q4H  . aspirin  81 mg Oral Daily  . feeding supplement  237 mL  Oral BID  . folic acid  1 mg Oral Daily  . heparin subcutaneous  5,000 Units Subcutaneous Q8H  . levETIRAcetam  500 mg Oral Daily  . metoprolol  2.5 mg Intravenous Q6H  . mulitivitamin with minerals  1 tablet Oral Daily  . nicotine  21 mg Transdermal Daily   Followed by  . nicotine  14 mg Transdermal Daily   Followed by  . nicotine  7 mg Transdermal Daily  . pantoprazole  40 mg Oral Q1200  . phenytoin  200 mg Oral BID  . thiamine  100  mg Oral Daily   Or  . thiamine  100 mg Intravenous Daily  . DISCONTD: levetiracetam  500 mg Intravenous Q12H  . DISCONTD: pantoprazole (PROTONIX) IV  40 mg Intravenous QHS  . DISCONTD: phenytoin (DILANTIN) IV  200 mg Intravenous BID  . DISCONTD: piperacillin-tazobactam (ZOSYN)  IV  3.375 g Intravenous Q8H   Continuous Infusions:    . sodium chloride 100 mL/hr at 06/05/11 0800   PRN Meds:.sodium chloride, acetaminophen, albuterol, ipratropium, labetalol, LORazepam, LORazepam, DISCONTD: LORazepam  Assessment/Plan:  1. Epilepsy s/p seizure: Patient has a substantial history of neurologic diseases such as epilepsy, meningitis, subependymal abscess status post ventricular shunt, and craniotomy.  Pt's Dilantin level is therapeutic but given reported history of recent alcohol consumption, this had likely lowered her seizure threshold level, leading to seizure. Pt experienced witnessed seizure on exam s/p Ativan 2 mg with resolution of seizure. Although she has a history of cocaine abuse her urine drug screen was negative for cocaine. TIA is high on differential given MRI findings. It is probably secondary to hemodynamic changes caused by seizure.  PLAN  -Patient has been stable.  Transfer from neuro ICU to tele bed  -continue home dose of Dilantin  -continue Keppra 500mg  on 2/4 -ASA 81 mg daily given probable TIA -CIWA protocol -Thiamine, folate given alcohol Hx. -Neurology on consulting, appreciate recommendations  -Seizure precautions  -blood culture x2-pending  2. Respiratory Distress: Pt exhibiting labored breathing on exam with prolonged expiratory phase. This is probably secondary to either STEMI or TIA.  This condition has resolved by the morning of 2/5. Hx significant for COPD with respiratory failure thus threshold for intubation is low in this case.  On admission, she oxygenated adequately with O2>90% on 2.5 L oxygen. O2 was increased to 4Ls overnight.  ABG showed respiratory acidosis  with metabolic compensation. Since her respiratory distress has improved.  O2 was weaned successfully. However, she continued to breath using accessory muscles.  Due to high likelihood for aspiration penumo based on her seizure history, she was started on Zosyn on 2/5.  2 view CXR done on 2/6 has ruled out aspiration pneumonia.  Thus, zosyn was stopped.  Better long term control of patient COPD is needed. PLAN  -continue Spiriva and advair PRN -PPI ppx for stress ulcer prophylasix  -Will make appointment with pulmonologist    3.Question of CVA: new stroke in not definite on imagining.  Neurology has assessed patient. She is not a tPA candidate given on-going seizure activity. Patient has probably had a TIA, instead of a CVA due to the resolution of her right-sided weakness and preferential gaze.  Patient has been stable.    PLAN  -continue close monitoring, appreciate neurology recommendations   4. STEMI: Patient presented to the ED with ST elevation in V3~V5. Troponin level has been trending down,  4.12--> 2.45--> 3.97--> 3.17--> 1.29.  Patient's last echo was done in Oct 2012, which  showed EF 65% with no wall motion abnormalities.  Given TIA, cardiac cath and heparin are not indicated.  Her EKG on 2/6 showed resolving ST-elevation, but T wave inversion and QTc of 523. Will consult cardiology.  Management at this point is restricted to repeat echo to assess heart damage.  PLAN  -Repeat Echo-reading pending -EKG in am  -Cannot give heparin drip due to TIA.  -D/C Lovenox -Consult cardiology for prolonged QTc.   5. Polysubstance abuse: pt continues to smoke cigarettes and drink alcohol. Patient's fidgety Patient has agreed to using a nicotine patch while inpatient.  PLAN  -social work consult     LOS: 2 days   Maren Beach 06/05/2011, 11:58 AM

## 2011-06-05 NOTE — Progress Notes (Signed)
Progressing officially signed off Call if needed She is overflow to [employer]. Tyson Alias, MD, FACP Pgr: [phone] [facility]

## 2011-06-05 NOTE — Progress Notes (Signed)
INITIAL ADULT NUTRITION ASSESSMENT Date: 06/05/2011   Time: 11:03 AM Reason for Assessment: Risk Report-Dependent feeding  ASSESSMENT: Female 56 y.o.  Dx: Seizure grand mal  Hx:  Past Medical History  Diagnosis Date  . Meningitis 2009  . Hx of ventricular shunt 2009  . Seizure disorder   . 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 Suicide attempt     History of suicide attempts August 2006 and July 2006  . History of alcohol abuse   . History of cocaine abuse   . Major depressive disorder     History of  . History of iron deficiency   . COPD (chronic obstructive pulmonary disease)   . History of acute alcoholic hepatitis   . Psychiatric diagnosis     Multiple  . 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 albuterol  2.5 mg Nebulization Q4H   And  . ipratropium  0.5 mg Nebulization Q4H  . aspirin  81 mg Oral Daily  . folic acid  1 mg Oral Daily  . heparin subcutaneous  5,000 Units Subcutaneous Q8H  . levETIRAcetam  500 mg Oral Daily  . metoprolol  2.5 mg Intravenous Q6H  . mulitivitamin with minerals  1 tablet Oral Daily  . nicotine  21 mg Transdermal Daily   Followed by  . nicotine  14 mg Transdermal Daily   Followed by  . nicotine  7 mg Transdermal Daily  . pantoprazole  40 mg Oral Q1200  . phenytoin  200 mg Oral BID  . piperacillin-tazobactam (ZOSYN)  IV  3.375 g Intravenous Q8H  . thiamine  100 mg Oral Daily   Or  . thiamine  100 mg Intravenous Daily  . DISCONTD: levetiracetam  500 mg Intravenous Q12H  . DISCONTD: nicotine  21 mg Transdermal Q24H  . DISCONTD: pantoprazole (PROTONIX) IV  40 mg Intravenous QHS  . DISCONTD: phenytoin (DILANTIN) IV  200 mg Intravenous BID    Ht: 5\' 5"  (165.1 cm)  Wt: 118 lb 6.2 oz (53.7 kg)  Ideal Wt: 56.8 kg % Ideal Wt: 95%  Usual Wt: 130 lbs %  Usual Wt: 91%  Body mass index is 19.70 kg/(m^2).  Food/Nutrition Related Hx:  Pt is able to feed herself. Per pt she lost approx 30 lbs over last 6 months due to poor appetite/medical issues but has started to regain weight and appetite is improving. Noted hx of substance abuse. Pt also has hx of COPD and continues to smoke and seems to have extra movements for breathing.   Labs:  CMP     Component Value Date/Time   NA 140 06/05/2011 0930   K 3.5 06/05/2011 0930   CL 107 06/05/2011 0930   CO2 24 06/05/2011 0930   GLUCOSE 139* 06/05/2011 0930   BUN 14 06/05/2011 0930   CREATININE 0.38* 06/05/2011 0930   CALCIUM 8.5 06/05/2011 0930   PROT 7.5 06/03/2011 1117   ALBUMIN 2.8* 06/04/2011 1000   AST 111* 06/03/2011 1117   ALT 90* 06/03/2011 1117   ALKPHOS 163* 06/03/2011 1117   BILITOT 0.3 06/03/2011 1117   GFRNONAA >90 06/05/2011 0930   GFRAA >90 06/05/2011 0930    Intake/Output Summary (Last 24 hours) at 06/05/11 1105 Last data filed at 06/05/11 0800  Gross per 24 hour  Intake   2829 ml  Output    280 ml  Net   2549 ml    Diet Order: Regular  Supplements/Tube Feeding: none  IVF:    sodium chloride Last Rate: 100 mL/hr at 06/05/11 0800    Estimated Nutritional Needs:   Kcal: 1600-1800 Protein: 80-95 grams Fluid: >1.8 L/day  NUTRITION DIAGNOSIS: -Increased nutrient needs (NI-5.1).  Status: Ongoing  RELATED TO: COPD, recent wt loss  AS EVIDENCE BY: estimated needs of 30-34 kcals/kg  MONITORING/EVALUATION(Goals): Goal: Pt will consume >/= 90% of her estimated needs Monitor: po intake, weight  EDUCATION NEEDS: -Education needs addressed  INTERVENTION:  Ensure clinical strength bid  Reviewed supplement options for pt once she is d/c'ed  Dietitian #:[phone]  DOCUMENTATION CODES Per approved criteria  -Not Applicable    Kendell Bane Cornelison 06/05/2011, 11:03 AM

## 2011-06-05 NOTE — Progress Notes (Signed)
Internal Medicine Attending  Date: 06/05/2011  Patient name: Wanda Prince Medical record number: [id number] Date of birth: Jul 12, 1955 Age: 56 y.o. Gender: female  I saw and evaluated the patient. I reviewed the resident's note by Dr. Bosie Clos and I agree with the resident's findings and plans as documented in her progress note.  To summarize Wanda Prince presentation, it appears she lowered her seizure threshold after drinking a six pack of beer at a [employer] party that resulted in a tonic-clonic seizure. During this time she may have been hypoxemic and this resulted in myocardial ischemia. Echocardiogram suggests focal wall motion abnormalities that are consistent with a myocardial infarction. With an ejection fraction of 35% some of her dyspnea could be related to decompensation of this cardiomyopathy or acute myocardial stunning.  1) Cardiomyopathy: We will start lisinopril 10 mg by mouth daily. This medication will be titrated upwards to target doses of 40 mg daily over the period of the next few weeks to months. Her beta blocker will be converted to an oral dose, and given the acute presentation and possible decompensation of her cardiomyopathy, we will start at a low dose. Specifically, metoprolol 25 mg by mouth twice a day.  Her cardiac risk factors will be aggressively addressed. She is started on a nicotine patch and has been counseled on the importance of tobacco cessation. She will be maintained on her aspirin 81 mg by mouth daily. She will also be started on simvastatin 20 mg by mouth at night in order to lower her cholesterol.  Cardiology is aware of Wanda Prince and her current situation. The plan at this time is to follow up in their office as an outpatient. There is no need to consult cardiology on the QTC prolongation. We need to make sure we avoid any medications that can prolong this further.  2) Seizures: She has had no evidence of further seizures since she was started on the Keppra and  maintained on the Dilantin. Of note, she has also not had the opportunity to lower her seizure threshold with alcohol since admission. The importance of alcohol cessation was discussed with Wanda Prince on rounds this morning.  3) COPD: Wanda Prince will be started on Spiriva. She will also be started on Advair. Neither of these medications will be when necessary, they will be scheduled. We will also continue the albuterol as needed. While in the hospital we will have respiratory therapy teach her how to use the MDI with a spacer so that she is able to adequately deliver her rescue therapy to the lungs.  4) Disposition: I agree with transferring Wanda Prince to a general medical ward while on telemetry. If she continues to do well it is likely she can be discharged tomorrow with close outpatient follow up at [facility].

## 2011-06-05 NOTE — Progress Notes (Signed)
Pt not fully oriented, and is confused about the procedure tomorrow despite numerous explanations. Due to questionable orientation, informed consent was obtained over the phone from the patient's mother, Thurston Hole Gleason [phone]). Consent form has been signed by two nurses and is in the patient's chart.

## 2011-06-05 NOTE — Consult Note (Signed)
Consulting Physician: [facility] teaching service Cardiologist: New  Reason for consultation: Abnormal cardiac enzymes and LV systolic dysfunction  History of Present Illness: 56 yo female with a history of HTN, seizure disorder, tobacco abuse, cocaine abuse, alchohol abuse, previous suicide attempts, COPD, meningitits, AV malformation brain, ventricular shunt who is admitted to [facility] to the IM teaching service after a witnessed seizure at home. Upon arrival to [facility], she was non-responsive. No evidence of intra-cranial hemorrhage. Brain MRI without any large areas of stroke or masses. Her initial EKG showed ST segment elevation in the precordial leads but given her lack of c/o chest pain and negative POC troponin, this was not felt to represent a coronary event. Over the course of the next two days, her troponin has been positive with initial troponin at 4.12 down to 3.97 with elevated CKMB. Echocardiogram yesterday with LVEF of 30-35% with akinesis of the mid-distalanteroseptal, anterior, anterolateral, inferior, and apical myocardium; consistent with infarction in the distribution of the left anterior descending coronary artery. She has had no chest pain. She has been treated with ASA and a beta blocker but IV heparin has not been used because of her neurological event with possible TIA.   She tells me today that she feels well today. She has had no chest pain at home over the last few months and has had no change in her breathing. No other complaints.   Past Medical History  Diagnosis Date  . Meningitis 2009  . Hx of ventricular shunt 2009  . Seizure disorder   . 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 Suicide attempt     History of suicide attempts August 2006 and July 2006  . History of alcohol abuse   . History of cocaine abuse   . Major depressive disorder    History of  . History of iron deficiency   . COPD (chronic obstructive pulmonary disease)   . History of acute alcoholic hepatitis   . Psychiatric diagnosis     Multiple  . 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 Left breast mass     Past Surgical History  Procedure Date  . Craniotomy   . Breast mass removal     Current Facility-Administered Medications  Medication Dose Route Frequency Provider Last Rate Last Dose  . albuterol (PROVENTIL) (5 MG/ML) 0.5% nebulizer solution 2.5 mg  2.5 mg Nebulization Q4H PRN Leodis Sias, MD   2.5 mg at 06/03/11 2045   And  . ipratropium (ATROVENT) nebulizer solution 0.5 mg  0.5 mg Nebulization Q4H PRN Leodis Sias, MD   0.5 mg at 06/03/11 2045  . aspirin chewable tablet 81 mg  81 mg Oral Daily Kristie Cowman, MD   81 mg at 06/05/11 1012  . feeding supplement (ENSURE CLINICAL STRENGTH) liquid 237 mL  237 mL Oral BID Heather Cornelison Pitts, RD   237 mL at 06/05/11 1418  . folic acid (FOLVITE) tablet 1 mg  1 mg Oral Daily Kristie Cowman, MD   1 mg at 06/05/11 1006  . heparin injection 5,000 Units  5,000 Units Subcutaneous Q8H Na Li, MD   5,000 Units at 06/05/11 1416  . levETIRAcetam (KEPPRA) tablet 500 mg  500 mg Oral Daily Leodis Sias, MD   500 mg at 06/05/11 1006  . LORazepam (ATIVAN) tablet  1 mg  1 mg Oral Q6H PRN Kristie Cowman, MD       Or  . LORazepam (ATIVAN) injection 1 mg  1 mg Intravenous Q6H PRN Kristie Cowman, MD      . metoprolol (LOPRESSOR) tablet 50 mg  50 mg Oral BID Kristie Cowman, MD      . mulitivitamin with minerals tablet 1 tablet  1 tablet Oral Daily Kristie Cowman, MD   1 tablet at 06/05/11 1006  . nicotine (NICODERM CQ - dosed in mg/24 hours) patch 21 mg  21 mg Transdermal Daily Kristie Cowman, MD   21 mg at 06/04/11 2218   Followed by  . nicotine (NICODERM CQ - dosed in mg/24 hours) patch 14 mg  14 mg Transdermal Daily Kristie Cowman, MD        Followed by  . nicotine (NICODERM CQ - dosed in mg/24 hr) patch 7 mg  7 mg Transdermal Daily Kristie Cowman, MD      . pantoprazole (PROTONIX) EC tablet 40 mg  40 mg Oral Q1200 Rocco Serene, MD   40 mg at 06/05/11 1418  . phenytoin (DILANTIN) ER capsule 200 mg  200 mg Oral BID Leodis Sias, MD   200 mg at 06/05/11 1408  . thiamine (VITAMIN B-1) tablet 100 mg  100 mg Oral Daily Kristie Cowman, MD   100 mg at 06/05/11 1000   Or  . thiamine (B-1) injection 100 mg  100 mg Intravenous Daily Kristie Cowman, MD      . DISCONTD: 0.9 %  sodium chloride infusion  250 mL Intravenous PRN Leodis Sias, MD      . DISCONTD: 0.9 %  sodium chloride infusion   Intravenous Continuous Leodis Sias, MD 100 mL/hr at 06/05/11 0800    . DISCONTD: acetaminophen (TYLENOL) suppository 650 mg  650 mg Rectal Q6H PRN Na Li, MD      . DISCONTD: albuterol (PROVENTIL) (5 MG/ML) 0.5% nebulizer solution 2.5 mg  2.5 mg Nebulization Q4H Leodis Sias, MD   2.5 mg at 06/05/11 1546  . DISCONTD: ipratropium (ATROVENT) nebulizer solution 0.5 mg  0.5 mg Nebulization Q4H Leodis Sias, MD   0.5 mg at 06/05/11 1546  . DISCONTD: labetalol (NORMODYNE,TRANDATE) injection 10 mg  10 mg Intravenous Q2H PRN Leodis Sias, MD      . DISCONTD: levETIRAcetam (KEPPRA) 500 mg in sodium chloride 0.9 % 100 mL IVPB  500 mg Intravenous Q12H Leodis Sias, MD   500 mg at 06/04/11 2300  . DISCONTD: metoprolol (LOPRESSOR) injection 2.5 mg  2.5 mg Intravenous Q6H Na Li, MD      . DISCONTD: pantoprazole (PROTONIX) injection 40 mg  40 mg Intravenous QHS Leodis Sias, MD   40 mg at 06/04/11 2219  . DISCONTD: phenytoin (DILANTIN) 200 mg in sodium chloride 0.9 % 100 mL IVPB  200 mg Intravenous BID Leodis Sias, MD   200 mg at 06/04/11 2230  . DISCONTD: piperacillin-tazobactam (ZOSYN) IVPB 3.375 g  3.375 g Intravenous Q8H Hilario Quarry Avalon, PHARMD   3.375 g at 06/05/11 4098    No Known  Allergies  History   Social History  . Marital Status: Legally Separated    Spouse Name: N/A    Number of Children: N/A  . Years of Education: N/A   Occupational History  . Not on file.   Social History Main Topics  . Smoking status: Current Everyday Smoker -- 1.0 packs/day    Types: Cigarettes  . Smokeless tobacco: Not on file  .  Alcohol Use: Yes  . Drug Use: No  . Sexually Active: Not on file   Other Topics Concern  . Not on file   Social History Narrative  . No narrative on file    Family History  Problem Relation Age of Onset  . Coronary artery disease Father     Review of Systems:  As stated in the HPI and otherwise negative.   BP 119/80  Pulse 94  Temp(Src) 98.2 F (36.8 C) (Oral)  Resp 22  Ht 5\' 7"  (1.702 m)  Wt 120 lb 2.4 oz (54.5 kg)  BMI 18.82 kg/m2  SpO2 98%  Physical Examination: General: Well developed, well nourished, NAD HEENT: OP clear, mucus membranes moist SKIN: warm, dry. No rashes. Neuro: No focal deficits Musculoskeletal: Muscle strength 5/5 all ext Psychiatric: Mood and affect normal Neck: No JVD, no carotid bruits, no thyromegaly, no lymphadenopathy. Lungs:Clear bilaterally, no wheezes, rhonci, crackles Cardiovascular: Regular rate and rhythm. No murmurs, gallops or rubs. Abdomen:Soft. Bowel sounds present. Non-tender.  Extremities: No lower extremity edema. Pulses are 2 + in the bilateral DP/PT.  EKG: 06/05/11: NSR, rate 85 bpm. Q waves V1-V3 with inverted T waves and flattening of T waves throughout the precordium. QTC 523 msec  Echo: 06/04/11:  Left ventricle: The cavity size was normal. Wall thickness was normal. Systolic function was moderately to severely reduced. The estimated ejection fraction was in the range of 30% to 35%. Akinesis of the mid-distalanteroseptal, anterior, anterolateral, inferior, and apical myocardium; consistent with infarction in the distribution of the left anterior descending coronary artery.  Doppler parameters are consistent with abnormal left ventricular relaxation (grade 1 diastolic dysfunction). - Atrial septum: No defect or patent foramen ovale was Identified.  Assessment and Plan:  56 yo WF with history of HTN, tobacco abuse, seizure disorder, cranial AV malformation s/p craniotiomy admitted to [facility] 06/03/11 with altered mental status and reported witnessed seizure activity. No evidence of CVA although this could have represented a TIA. Troponin has been positive since admission but pt has no chest pain. EKG c/w anterior infarct with large Q waves in the anteroseptal leads and T wave changes in the anterolateral and lateral leads. Her echocardiogram is c/w an anterior infarct as well. This likely represents an anterior infarct, age unknown. The question is if her presenting event could have been mental status changes after an arrythmia.   1. LV systolic dysfunction: It is unclear if this is driven by an ischemic process but given her troponin elevation, echo findings, EKG changes and risk factors for CAD, it is highly likely that she has severe underlying CAD. I have discussed a cardiac cath with the patient and she agrees to proceed. I will make her NPO at midnight tonight and put on the cath schedule for tomorrow. Risks and benefits have been reviewed with the patient including bleeding, infection, stroke, MI, death, renal insufficiency. She agrees to proceed. If coronary intervention is needed, we would use Plavix and not one of our newer, more potent P2Y12 inhibitors such as Effient or Brilinta.   2. NSTEMI: see above. Will exclude CAD by cardiac cath in am.   3. Tobacco abuse: Smoking cessation encouraged.   4. QTc prolongation: avoid QT prolonging drugs.

## 2011-06-05 NOTE — Progress Notes (Signed)
Resident Addendum to Medical Student Note   I have seen and examined the patient, and agree with the the medical student assessment and plan outlined above. Please see my brief note below for additional details.  S: Feels better since admission but feels that her work of breath is worse now than her baseline sob.  OBJECTIVE: VS Reviewed  Meds: Reviewed  Labs: Reviewed  Imaging: Reviewed   Physical Exam: General:  Vital signs reviewed and noted. Well-developed, well-nourished, in no acute distress; alert, appropriate and cooperative throughout examination.  HEENT:  Normocephalic, atraumatic  Lungs:  Normal respiratory effort. Clear to auscultation BL without crackles or wheezes.  Heart:  RRR. S1 and S2 normal without gallop, murmur, or rubs.  Abdomen:  BS normoactive. Soft, Nondistended, non-tender. No masses or organomegaly.  Extremities:  No pretibial edema.    ASSESSMENT/ PLAN: Pt is a 56 y.o. yo female Admitted with seizures in setting likely TIA and STEMI.   1. Epilepsy s/p seizure: currently controlled on Dilantin and Keppra, Neurology following, risk stratified with addition of Aspirin and simvastatin given TIA, appears fidgety unchanged from initial exam with patient reporting daily beer intake and ~1/2 ppd cigarettes, UCX and BCX wgtd x2day  PLAN  -transfer to telemetry  -cont Dilantin  -cont Keppra given that she will likley resume ETOH consumption thus lowering seizure threshold on Dilantin alone -cont CIWA protocol  -cont Nicotine patch 21 mg daily   2. Respiratory Distress: in setting of COPD, resolved, currently oxygen sat stable at 100 on room air PLAN  -CXR PA/Lat c/w interstitial edema but LLL PNA not ruled out -cont Spiriva and advair PRN  -f/u CXR in 6 weeks as outpt -d/c Zosyn given that asp pna is less likely  3. TIA?: symptoms resolved within a few hours  PLAN  -continue close monitoring  -cont aspirin therapy 81mg  qd   4. STEMI: anterior, Hep  deferred in setting of TIA, ECHO demonstrating  EF 30-35% (decreased from 60-65% 01/2011) with akinesis of ant, antlat, inf and apical walls with Grade 1 diastolic dysfunction, troponin trendsing down PLAN -cont daily EKG  -notify Cardiology for possible future cath given ECHO findings   Lab 06/05/11 0930 06/04/11 1015 06/04/11 0126 06/03/11 2003 06/03/11 1821  TROPONINI 1.29* 3.17* 3.97* 2.45* 4.12*    5. Polysubstance abuse: requesting continued nicotine patch and interested in other modes to assist with tobacco cessation ie nicotine gum PLAN -reports being on "Disability" as sole source of support  -cont Nicotine patch  6. Urinary Retention: likely secondary to recent urethral foley, no c/o pain, dysuria, mass PLAN -cont to montior uop and symptoms  Length of Stay: 2   Kristie Cowman, MD PGYI, Internal Medicine Resident 06/05/2011, 12:54 PM '

## 2011-06-06 ENCOUNTER — Encounter (HOSPITAL_COMMUNITY)
Admission: EM | Disposition: A | Discharge status: Home / Self Care | Payer: Self-pay | Source: Home / Self Care | Attending: Internal Medicine

## 2011-06-06 ENCOUNTER — Other Ambulatory Visit: Payer: Self-pay

## 2011-06-06 DIAGNOSIS — I2510 Atherosclerotic heart disease of native coronary artery without angina pectoris: Secondary | ICD-10-CM

## 2011-06-06 DIAGNOSIS — R339 Retention of urine, unspecified: Secondary | ICD-10-CM

## 2011-06-06 HISTORY — PX: LEFT HEART CATHETERIZATION WITH CORONARY ANGIOGRAM: SHX5451

## 2011-06-06 LAB — PHENYTOIN LEVEL, FREE AND TOTAL
Phenytoin Bound: 6.2 mg/L
Phenytoin, Free: 1 mg/L (ref 1.0–2.0)
Phenytoin, Total: 7.2 mg/L — ABNORMAL LOW (ref 10.0–20.0)

## 2011-06-06 SURGERY — LEFT HEART CATHETERIZATION WITH CORONARY ANGIOGRAM
Anesthesia: LOCAL

## 2011-06-06 MED ORDER — HEPARIN (PORCINE) IN NACL 2-0.9 UNIT/ML-% IJ SOLN
INTRAMUSCULAR | Status: AC
  Filled 2011-06-06: qty 2000

## 2011-06-06 MED ORDER — FOLIC ACID 1 MG PO TABS
1.0000 mg | ORAL_TABLET | Freq: Every day | ORAL | Status: DC
  Administered 2011-06-06 – 2011-06-07 (×2): 1 mg via ORAL
  Filled 2011-06-06 (×2): qty 1

## 2011-06-06 MED ORDER — LIDOCAINE HCL (PF) 1 % IJ SOLN
INTRAMUSCULAR | Status: AC
  Filled 2011-06-06: qty 30

## 2011-06-06 MED ORDER — ACETAMINOPHEN 325 MG PO TABS
650.0000 mg | ORAL_TABLET | ORAL | Status: DC | PRN
  Administered 2011-06-06: 650 mg via ORAL
  Filled 2011-06-06: qty 2

## 2011-06-06 MED ORDER — LISINOPRIL 10 MG PO TABS
10.0000 mg | ORAL_TABLET | Freq: Every day | ORAL | Status: DC
  Administered 2011-06-06 – 2011-06-07 (×2): 10 mg via ORAL
  Filled 2011-06-06 (×2): qty 1

## 2011-06-06 MED ORDER — SODIUM CHLORIDE 0.9 % IV SOLN: INTRAVENOUS | Status: AC

## 2011-06-06 MED ORDER — GUAIFENESIN-DM 100-10 MG/5ML PO SYRP
5.0000 mL | ORAL_SOLUTION | ORAL | Status: DC | PRN
  Administered 2011-06-06 – 2011-06-07 (×3): 5 mL via ORAL
  Filled 2011-06-06 (×4): qty 5

## 2011-06-06 MED ORDER — MIDAZOLAM HCL 2 MG/2ML IJ SOLN
INTRAMUSCULAR | Status: AC
  Filled 2011-06-06: qty 2

## 2011-06-06 MED ORDER — FENTANYL CITRATE 0.05 MG/ML IJ SOLN
INTRAMUSCULAR | Status: AC
  Filled 2011-06-06: qty 2

## 2011-06-06 MED ORDER — THIAMINE HCL 100 MG/ML IJ SOLN
100.0000 mg | Freq: Every day | INTRAMUSCULAR | Status: DC
  Administered 2011-06-07: 11:00:00 via INTRAVENOUS
  Filled 2011-06-06 (×2): qty 1

## 2011-06-06 MED ORDER — VITAMIN B-1 100 MG PO TABS
100.0000 mg | ORAL_TABLET | Freq: Every day | ORAL | Status: DC
  Administered 2011-06-06 – 2011-06-07 (×2): 100 mg via ORAL
  Filled 2011-06-06 (×2): qty 1

## 2011-06-06 MED ORDER — ADULT MULTIVITAMIN W/MINERALS CH
1.0000 | ORAL_TABLET | Freq: Every day | ORAL | Status: DC
  Administered 2011-06-06 – 2011-06-07 (×2): 1 via ORAL
  Filled 2011-06-06 (×2): qty 1

## 2011-06-06 MED ORDER — LORAZEPAM 2 MG/ML IJ SOLN: 1.0000 mg | INTRAMUSCULAR | Status: DC | PRN

## 2011-06-06 MED ORDER — ONDANSETRON HCL 4 MG/2ML IJ SOLN: 4.0000 mg | Freq: Four times a day (QID) | INTRAMUSCULAR | Status: DC | PRN

## 2011-06-06 MED ORDER — NITROGLYCERIN 0.2 MG/ML ON CALL CATH LAB
INTRAVENOUS | Status: AC
  Filled 2011-06-06: qty 1

## 2011-06-06 MED ORDER — SIMVASTATIN 20 MG PO TABS
20.0000 mg | ORAL_TABLET | Freq: Every day | ORAL | Status: DC
  Administered 2011-06-06: 20 mg via ORAL
  Filled 2011-06-06 (×3): qty 1

## 2011-06-06 MED ORDER — LORAZEPAM 1 MG PO TABS
1.0000 mg | ORAL_TABLET | ORAL | Status: DC | PRN
  Administered 2011-06-06 – 2011-06-07 (×2): 1 mg via ORAL
  Filled 2011-06-06 (×3): qty 1

## 2011-06-06 NOTE — Progress Notes (Signed)
     Cardiology Progress Note Patient Name: Wanda Prince Date of Encounter: 06/06/2011, 8:56 AM     Subjective  Patient was confused and agitated overnight which occurred after she received ativan for CIWA protocol, unsure if this was the cause. She was moved closer to the nurses station and monitored closely. She is doing better this am, but still does not know why she is in the hospital. Cardiac cath consent obtained from mother. Denies chest pain or shortness of breath.   Objective   Telemetry: Sinus rhythm 80s-90s, no arrhythmias noted  Medications: . aspirin  324 mg Oral Pre-Cath  . aspirin  81 mg Oral Daily  . diazepam  5 mg Oral On Call  . feeding supplement  237 mL Oral BID  . folic acid  1 mg Oral Daily  . heparin subcutaneous  5,000 Units Subcutaneous Q8H  . levETIRAcetam  500 mg Oral Daily  . metoprolol tartrate  50 mg Oral BID  . mulitivitamin with minerals  1 tablet Oral Daily  . nicotine  21 mg Transdermal Daily   Followed by  . nicotine  14 mg Transdermal Daily   Followed by  . nicotine  7 mg Transdermal Daily  . pantoprazole  40 mg Oral Q1200  . phenytoin  200 mg Oral BID  . sodium chloride  3 mL Intravenous Q12H  . thiamine  100 mg Oral Daily   Or  . thiamine  100 mg Intravenous Daily  . DISCONTD: piperacillin-tazobactam (ZOSYN)  IV  3.375 g Intravenous Q8H   . sodium chloride 50 mL/hr at 06/06/11 0425    Physical Exam: Temp:  [97.9 F (36.6 C)-98.2 F (36.8 C)] 98 F (36.7 C) (02/07 0635) Pulse Rate:  [86-97] 93  (02/07 0635) Resp:  [22-27] 22  (02/07 0635) BP: (119-129)/(75-84) 122/83 mmHg (02/07 0635) SpO2:  [94 %-100 %] 96 % (02/07 0635) Weight:  [120 lb 2.4 oz (54.5 kg)] 120 lb 2.4 oz (54.5 kg) (02/07 0500)  General: White female, appears older than stated age, in no acute distress. Head: Normocephalic, atraumatic, sclera non-icteric, nares are without discharge.  Neck: Supple. Negative for carotid bruits or JVD Lungs: Diminished, clear  bilaterally to auscultation without wheezes, rales, or rhonchi. Breathing is unlabored. Heart: RRR S1 S2 without murmurs, rubs, or gallops.  Abdomen: Soft, non-tender, non-distended with normoactive bowel sounds. No rebound/guarding. No obvious abdominal masses. Msk:  Strength and tone appear normal for age. Extremities: No edema. No clubbing or cyanosis. Distal pedal pulses are 2+ and equal bilaterally. Neuro: Alert and oriented to person and relative time, was aware she was in a hospital but didn't know why. Moves all extremities spontaneously. Psych:  Responds to questions appropriately with a normal affect.  Intake/Output Summary (Last 24 hours) at 06/06/11 0856 Last data filed at 06/06/11 0425  Gross per 24 hour  Intake   1360 ml  Output    301 ml  Net   1059 ml    Labs:  Basename 06/05/11 0930 06/04/11 0127 06/03/11 2004  NA 140 139 --  K 3.5 4.0 --  CL 107 105 --  CO2 24 25 --  GLUCOSE 139* 129* --  BUN 14 17 --  CREATININE 0.38* 0.46* --  CALCIUM 8.5 8.3* --  MG -- -- 1.9   Basename 06/04/11 1000 06/03/11 1117  AST -- 111*  ALT -- 90*  ALKPHOS -- 163*  BILITOT -- 0.3  PROT -- 7.5  ALBUMIN 2.8* 3.6     Basename 06/05/11 0930 06/04/11 0127 06/03/11 1117  WBC 6.3 15.1* --  NEUTROABS -- -- 5.9  HGB 12.0 13.3 --  HCT 36.4 40.3 --  MCV 102.8* 101.8* --  PLT 107* 138* --   Basename 06/05/11 0930 06/04/11 1015 06/04/11 0126 06/03/11 2003 06/03/11 1821  CKTOTAL -- 259* 289* 292* 258*  CKMB -- 14.3* 16.6* 18.7* 17.5*  TROPONINI 1.29* 3.17* 3.97* 2.45* --   Basename 06/03/11 2004  VITAMINB12 992*    Radiology/Studies:  06/04/2011 - CXR  Findings: Changes of underlying COPD are identified with a stable degree of hyperinflation.  Heart size is within normal limits and a stable mediastinal contour is seen.  The lung fields are notable for increased interstitial markings involving the posterior and apical segments of the left lower lobe. Interstitial septal lines, mild  prominence of the fissures and a small left pleural effusion are noted. Some mild improvement is noted in comparison with the prior exam and these findings as well as some increased delineation to the vascular markings in the right hemithorax suggests that this may be the result of improving asymmetric pulmonary edema rather than due to an infiltrative process. This would be even more suspicious is if the patient typically lies with the left side down. An area of left lower lobe pneumonia is not completely ruled out and continued follow-up to clearing would be recommended.  Bony structures appear intact with stable healed left-sided rib fractures.  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  As left lower lobe pneumonia could still be present, follow-up to clearing is recommended.    06/03/2011 - Ct Head Wo Contrast  Findings: Prior right frontal craniotomy.  Right frontal ventricular shunt catheter extends into the third ventricle and is unchanged.  Right lateral ventricle is decompressed and slit-like. Left lateral ventricle is mildly enlarged with mild enlargement of the left temporal horn.  This is stable.  The fourth ventricle is mildly enlarged and this is unchanged.  No midline shift.  Encephalomalacia right frontal lobe is stable.  Left frontal hyperintensity is unchanged.  This is associated with small calcifications.  This appears to be a chronic process and may be related to chronic hemorrhage related to a vascular malformation. This may be a small AVM or venous malformation.  Abnormal vasculature is present in this area on the CTA 07/28/2005.  No acute hemorrhage.  Hypodensity throughout the left cerebral white matter is stable and is compatible with chronic ischemia.  No acute infarct or mass.  Sinusitis with air-fluid levels in both  maxillary sinuses.  IMPRESSION: No change from the prior CT.  The right lateral and third ventricles are decompressed with a catheter.  There is mild chronic enlargement of the left lateral ventricle and fourth ventricle.   06/03/2011 - Mr Brain Ltd W/o Cm  Findings: Study was limited to diffusion weighted imaging only.  A complete study was not performed.  Right frontal shunt catheter is in place causing significant artifact.   Encephalomalacia in the frontal lobes bilaterally.  Possible small area of restricted diffusion in the left medial thalamus which may represent acute infarct.  Correlate with clinical symptoms.  IMPRESSION: Limited study.  Possible small area of acute infarct left medial thalamus.     Assessment and Plan  55 yof w/ h/o HTN, tobacco abuse, seizure disorder, cranial AV malformation s/p craniotiomy admitted to    Hospital 06/03/11 with altered mental status and reported witnessed seizure activity. No evidence of CVA although this could have represented a TIA. Troponin has been positive since admission but pt has no chest pain. EKG c/w anterior infarct with large Q waves in the anteroseptal leads and T wave changes in the anterolateral and lateral leads. Her echocardiogram is c/w an anterior infarct as well. This likely represents an anterior infarct, age unknown. The question is if her presenting event could have been mental status changes after an arrythmia.  1. LV systolic dysfunction: It is unclear if this is driven by an ischemic process but given her troponin elevation, echo findings, EKG changes and risk factors for CAD, it is highly likely that she has severe underlying CAD. Cardiac cath is planned for today. If coronary intervention is needed, we would use Plavix and not one of our newer, more potent P2Y12 inhibitors such as Effient or Brilinta. Cont ASA and BB. Consider addition of statin pending cath results.  2. NSTEMI: see above. Will exclude CAD by cardiac cath.  3.  Tobacco abuse: Smoking cessation encouraged.  4. QTc prolongation: avoid QT prolonging drugs.    Signed, HOPE, JESSICA PA-C  I have personally seen and examined this patient. I agree with the assessment and plan as outlined above. Plans for left heart cath this am.   MCALHANY,CHRISTOPHER 9:37 AM 06/06/2011  

## 2011-06-06 NOTE — Interval H&P Note (Signed)
History and Physical Interval Note:  06/06/2011 9:37 AM  Wanda Prince  has presented today for cardiac cath with the diagnosis of Chest pain  The various methods of treatment have been discussed with the patient and family. After consideration of risks, benefits and other options for treatment, the patient has consented to  Procedure(s): LEFT HEART CATHETERIZATION WITH CORONARY ANGIOGRAM as a surgical intervention .  The patients' history has been reviewed, patient examined, no change in status, stable for surgery.  I have reviewed the patients' chart and labs.  Questions were answered to the patient's satisfaction.     Murial Beam

## 2011-06-06 NOTE — Progress Notes (Signed)
Speech Language/Pathology Arrived for check of diet tolerance, Pt is NPO after midnight for procedure. Will f/u when pt is able. Harlon Ditty, MA CCC-SLP [phone]

## 2011-06-06 NOTE — Progress Notes (Signed)
Patient is still anxious and wants to leave so she can smoke a cigarette.  MD did come back to the floor to look at the EKG and I was informed to just watch the patient.  Charge nurse called and was able to get a Recruitment consultant for the morning.

## 2011-06-06 NOTE — H&P (View-Only) (Signed)
Cardiology Progress Note Patient Name: Wanda Prince Date of Encounter: 06/06/2011, 8:56 AM     Subjective  Patient was confused and agitated overnight which occurred after she received ativan for CIWA protocol, unsure if this was the cause. She was moved closer to the nurses station and monitored closely. She is doing better this am, but still does not know why she is in the hospital. Cardiac cath consent obtained from mother. Denies chest pain or shortness of breath.   Objective   Telemetry: Sinus rhythm 80s-90s, no arrhythmias noted  Medications: . aspirin  324 mg Oral Pre-Cath  . aspirin  81 mg Oral Daily  . diazepam  5 mg Oral On Call  . feeding supplement  237 mL Oral BID  . folic acid  1 mg Oral Daily  . heparin subcutaneous  5,000 Units Subcutaneous Q8H  . levETIRAcetam  500 mg Oral Daily  . metoprolol tartrate  50 mg Oral BID  . mulitivitamin with minerals  1 tablet Oral Daily  . nicotine  21 mg Transdermal Daily   Followed by  . nicotine  14 mg Transdermal Daily   Followed by  . nicotine  7 mg Transdermal Daily  . pantoprazole  40 mg Oral Q1200  . phenytoin  200 mg Oral BID  . sodium chloride  3 mL Intravenous Q12H  . thiamine  100 mg Oral Daily   Or  . thiamine  100 mg Intravenous Daily  . DISCONTD: piperacillin-tazobactam (ZOSYN)  IV  3.375 g Intravenous Q8H   . sodium chloride 50 mL/hr at 06/06/11 0425    Physical Exam: Temp:  [97.9 F (36.6 C)-98.2 F (36.8 C)] 98 F (36.7 C) (02/07 0635) Pulse Rate:  [86-97] 93  (02/07 0635) Resp:  [22-27] 22  (02/07 0635) BP: (119-129)/(75-84) 122/83 mmHg (02/07 0635) SpO2:  [94 %-100 %] 96 % (02/07 0635) Weight:  [120 lb 2.4 oz (54.5 kg)] 120 lb 2.4 oz (54.5 kg) (02/07 0500)  General: White female, appears older than stated age, in no acute distress. Head: Normocephalic, atraumatic, sclera non-icteric, nares are without discharge.  Neck: Supple. Negative for carotid bruits or JVD Lungs: Diminished, clear  bilaterally to auscultation without wheezes, rales, or rhonchi. Breathing is unlabored. Heart: RRR S1 S2 without murmurs, rubs, or gallops.  Abdomen: Soft, non-tender, non-distended with normoactive bowel sounds. No rebound/guarding. No obvious abdominal masses. Msk:  Strength and tone appear normal for age. Extremities: No edema. No clubbing or cyanosis. Distal pedal pulses are 2+ and equal bilaterally. Neuro: Alert and oriented to person and relative time, was aware she was in a hospital but didn't know why. Moves all extremities spontaneously. Psych:  Responds to questions appropriately with a normal affect.  Intake/Output Summary (Last 24 hours) at 06/06/11 0856 Last data filed at 06/06/11 0425  Gross per 24 hour  Intake   1360 ml  Output    301 ml  Net   1059 ml    Labs:  Basename 06/05/11 0930 06/04/11 0127 06/03/11 2004  NA 140 139 --  K 3.5 4.0 --  CL 107 105 --  CO2 24 25 --  GLUCOSE 139* 129* --  BUN 14 17 --  CREATININE 0.38* 0.46* --  CALCIUM 8.5 8.3* --  MG -- -- 1.9   Basename 06/04/11 1000 06/03/11 1117  AST -- 111*  ALT -- 90*  ALKPHOS -- 163*  BILITOT -- 0.3  PROT -- 7.5  ALBUMIN 2.8* 3.6  Basename 06/05/11 0930 06/04/11 0127 06/03/11 1117  WBC 6.3 15.1* --  NEUTROABS -- -- 5.9  HGB 12.0 13.3 --  HCT 36.4 40.3 --  MCV 102.8* 101.8* --  PLT 107* 138* --   Basename 06/05/11 0930 06/04/11 1015 06/04/11 0126 06/03/11 2003 06/03/11 1821  CKTOTAL -- 259* 289* 292* 258*  CKMB -- 14.3* 16.6* 18.7* 17.5*  TROPONINI 1.29* 3.17* 3.97* 2.45* --   Basename 06/03/11 2004  VITAMINB12 992*    Radiology/Studies:  06/04/2011 - CXR  Findings: Changes of underlying COPD are identified with a stable degree of hyperinflation.  Heart size is within normal limits and a stable mediastinal contour is seen.  The lung fields are notable for increased interstitial markings involving the posterior and apical segments of the left lower lobe. Interstitial septal lines, mild  prominence of the fissures and a small left pleural effusion are noted. Some mild improvement is noted in comparison with the prior exam and these findings as well as some increased delineation to the vascular markings in the right hemithorax suggests that this may be the result of improving asymmetric pulmonary edema rather than due to an infiltrative process. This would be even more suspicious is if the patient typically lies with the left side down. An area of left lower lobe pneumonia is not completely ruled out and continued follow-up to clearing would be recommended.  Bony structures appear intact with stable healed left-sided rib fractures.  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  As left lower lobe pneumonia could still be present, follow-up to clearing is recommended.    06/03/2011 - Ct Head Wo Contrast  Findings: Prior right frontal craniotomy.  Right frontal ventricular shunt catheter extends into the third ventricle and is unchanged.  Right lateral ventricle is decompressed and slit-like. Left lateral ventricle is mildly enlarged with mild enlargement of the left temporal horn.  This is stable.  The fourth ventricle is mildly enlarged and this is unchanged.  No midline shift.  Encephalomalacia right frontal lobe is stable.  Left frontal hyperintensity is unchanged.  This is associated with small calcifications.  This appears to be a chronic process and may be related to chronic hemorrhage related to a vascular malformation. This may be a small AVM or venous malformation.  Abnormal vasculature is present in this area on the CTA 07/28/2005.  No acute hemorrhage.  Hypodensity throughout the left cerebral white matter is stable and is compatible with chronic ischemia.  No acute infarct or mass.  Sinusitis with air-fluid levels in both  maxillary sinuses.  IMPRESSION: No change from the prior CT.  The right lateral and third ventricles are decompressed with a catheter.  There is mild chronic enlargement of the left lateral ventricle and fourth ventricle.   06/03/2011 - Mr Brain Ltd W/o Cm  Findings: Study was limited to diffusion weighted imaging only.  A complete study was not performed.  Right frontal shunt catheter is in place causing significant artifact.   Encephalomalacia in the frontal lobes bilaterally.  Possible small area of restricted diffusion in the left medial thalamus which may represent acute infarct.  Correlate with clinical symptoms.  IMPRESSION: Limited study.  Possible small area of acute infarct left medial thalamus.     Assessment and Plan  28 yof w/ h/o HTN, tobacco abuse, seizure disorder, cranial AV malformation s/p craniotiomy admitted to [facility] 06/03/11 with altered mental status and reported witnessed seizure activity. No evidence of CVA although this could have represented a TIA. Troponin has been positive since admission but pt has no chest pain. EKG c/w anterior infarct with large Q waves in the anteroseptal leads and T wave changes in the anterolateral and lateral leads. Her echocardiogram is c/w an anterior infarct as well. This likely represents an anterior infarct, age unknown. The question is if her presenting event could have been mental status changes after an arrythmia.  1. LV systolic dysfunction: It is unclear if this is driven by an ischemic process but given her troponin elevation, echo findings, EKG changes and risk factors for CAD, it is highly likely that she has severe underlying CAD. Cardiac cath is planned for today. If coronary intervention is needed, we would use Plavix and not one of our newer, more potent P2Y12 inhibitors such as Effient or Brilinta. Cont ASA and BB. Consider addition of statin pending cath results.  2. NSTEMI: see above. Will exclude CAD by cardiac cath.  3.  Tobacco abuse: Smoking cessation encouraged.  4. QTc prolongation: avoid QT prolonging drugs.    Signed, HOPE, JESSICA PA-C  I have personally seen and examined this patient. I agree with the assessment and plan as outlined above. Plans for left heart cath this am.   MCALHANY,CHRISTOPHER 9:37 AM 06/06/2011

## 2011-06-06 NOTE — Op Note (Signed)
Cardiac Catheterization Operative Report  Wanda Prince [id number] 2/7/20139:38 AM No primary provider on file.  Procedure Performed:  1. Left Heart Catheterization 2. Selective Coronary Angiography 3. Left ventricular angiogram  Operator: Verne Carrow, MD  Indication:   NSTEMI, anterior wall motion abnormality on echo, LV systolic dysfunction, EKG changes                                    Procedure Details: The risks, benefits, complications, treatment options, and expected outcomes were discussed with the patient. The patient and/or family concurred with the proposed plan, giving informed consent. The patient was brought to the cath lab after IV hydration was begun and oral premedication was given. The patient was further sedated with Versed and Fentanyl. The right groin was prepped and draped in the usual manner. Using the modified Seldinger access technique, a 5 French sheath was placed in the right femoral artery. Standard diagnostic catheters were used to perform selective coronary angiography. LV pressures were measured with the JR-4 catheter.   There were no immediate complications. The patient was taken to the recovery area in stable condition.   Hemodynamic Findings: Central aortic pressure: 124/80 Left ventricular pressure: 127/22/29  Angiographic Findings:  Left main: No obstructive disease.   Left Anterior Descending Artery: Large vessel that courses to the apex. There is a tubular 30% stenosis in the mid vessel. The vessel does not reach the apex. Distally there is a bifurcation into a diagonal branch and a small caliber branch.   Circumflex Artery: Large caliber vessel. 2 obtuse marginal branches. No obstructive disease.   Right Coronary Artery: Large, dominant vessel. 40% tubular stenosis mid vessel. This does not appear to be flow limiting.   Left Ventricular Angiogram: Not performed.   Impression: 1. Mild non-obstructive CAD 2. Non-ischemic  cardiomyopathy   Recommendations: Medical management. Continue ASA and beta blocker. Would start Ace-inhibitor and statin. Would monitor overnight tonight. OK to d/c home tomorrow if groin cath site stable.        Complications:  None. The patient tolerated the procedure well.

## 2011-06-06 NOTE — Progress Notes (Signed)
Internal Medicine Attending  Date: 06/06/2011  Patient name: Wanda Prince Medical record number: [id number] Date of birth: 1955/07/01 Age: 57 y.o. Gender: female  I saw and evaluated the patient. I reviewed the resident's note by Dr. Bosie Clos and I agree with the resident's findings and plans as documented in her progress note.  Ms. Toso day has been complicated by acute agitation and delirium likely rate related to alcohol withdrawal. She is on a modified CIWA protocol and is now requiring a 1-1 sitter to ensure her safety. We will continue to work on treating her alcohol withdrawal. Given her recent seizure, benzodiazepine therapy is important. She did undergo a cardiac catheterization earlier this morning which showed a 30% lesion in the LAD and a 40% lesion in the RCA. Risk factor modification is the therapy of choice given this result. Once we get her through her alcohol withdrawal she should be stable for discharge to home.

## 2011-06-06 NOTE — Progress Notes (Signed)
MD saw patient and agreed that she was confused.  Patient thought that she was at her home and not in the hospital.  I was asked to keep an eye on the patients neuro status and ordered a breathing treatment because the patients lungs sound very tight.  I reminded that patient about the treatment and called respiratory.  I set the bed alarm and informed the patient to call when she needed to get up.  Also informed the patient that I would move her to a closer room for  Her safety if she became more confused.

## 2011-06-06 NOTE — Progress Notes (Signed)
Clinical Social Work-CSW attempted to assess pt-pt had two family members in the room and requested CSW re-visit. CSW will complete full psychosocial assessment in AM-Laniya Friedl-MSW, [phone]

## 2011-06-06 NOTE — Progress Notes (Signed)
Pt has begun to act more confused, agitated and restless.  I have notified the MD on call and she will be making a stop to see the patient shortly.    All of this has happened after I administered a PRN dose of ativan for a CIWA score of 7.  Dose was given in PO form and I informed the doctor that I do not think this is a response from the ativan because I was just given before she began to act confused.  Pt continues to remove telemetry box and I informed the doctor as well.  Due to cardiac procedure in the morning her telemetry box must stay on.

## 2011-06-06 NOTE — Progress Notes (Signed)
Resident Addendum to Medical Student Note   I have seen and examined the patient, and agree with the the medical student assessment and plan outlined above. Please see my brief note below for additional details.  S: Much agitation and anxiety overnight, S/p cardiac cath, wanting nicotine patch although already has one on   OBJECTIVE: VS: Reviewed  Meds: Reviewed  Labs: Reviewed  Imaging: Reviewed   Physical Exam: General:  Vital signs reviewed and noted. Lying flat, in no acute distress; alert, appropriate and cooperative throughout examination.  HEENT:  Normocephalic, atraumatic  Lungs:  Normal respiratory effort. Clear to auscultation BL anterior fields without crackles or wheezes.  Heart:  RRR. S1 and S2 normal without gallop, murmur, or rubs.  Abdomen:  BS normoactive. Soft, Nondistended, non-tender. No masses or organomegaly.  Extremities:  No pretibial edema.    ASSESSMENT/ PLAN: Pt is a 56 y.o. yo female admitted with seizures in setting of likely TIA and anterior STEMI.   1. Epilepsy s/p seizure: currently controlled on Dilantin and Keppra, Neurology signed off, risk stratified with addition of Aspirin and simvastatin given TIA, UCX and BCX wgtd x3day  PLAN  -cont telemetry  -cont Dilantin  -cont Keppra given that she will likley resume ETOH consumption thus lowering seizure threshold on Dilantin alone  -cont CIWA protocol with modification to begin Ativan at score of 8 with dosage progression -cont Nicotine patch 21 mg daily   2. Respiratory Distress: in setting of COPD, resolved, currently oxygen sat stable at 100 on room air  PLAN  -cont Spiriva and advair PRN  -f/u CXR in 6 weeks as outpt   3. TIA?: symptoms resolved within a few hours  PLAN  -continue close monitoring  -cont aspirin therapy 81mg  qd   4. STEMI: anterior, Hep deferred in setting of TIA, ECHO demonstrating EF 30-35% (decreased from 60-65% 01/2011) with akinesis of ant, antlat, inf and  apical walls with Grade 1 diastolic dysfunction, troponin trending down, s/p cardiac cath demonstrating non-obstructive CAD and non-obstructive cardiomyopathy PLAN -cont Beta blocker -start simvastatin 20 mg qd -start lisinopril 10 mg qd with goal 40 mg qd   5. Polysubstance abuse: requesting continued nicotine patch, fidgety and anxious for the most part PLAN  -reports being on "Disability" as sole source of support  -cont Nicotine patch  -cont CIWA with escalation of Ativan  6. Urinary Retention: resolved, likely secondary to recent urethral foley, no c/o pain, dysuria, mass  PLAN  -cont to [facility] and symptoms   Length of Stay: 3   Kristie Cowman, MD PGYI, Internal Medicine Resident 06/06/2011, 12:41 PM

## 2011-06-06 NOTE — Progress Notes (Signed)
Pt was moved to room 6704 from room 6736 for safety concerns.  She has progressively gotten more and more agitated and confused.  Notified MD and they requested another EKG on the patient.  EKG being completed.

## 2011-06-06 NOTE — Progress Notes (Signed)
Subjective:  Patient appeared agitated and confused overnight.  She demanded to leave the hospital in order to smoke cigarette.  Ativan was given.  Patient was seen after cardiac cath.  She appeared agitated, demanding nicotine patch thought she already had one on.  She complained of CP and SOB.  She denied HA.   Objective: Vital signs in last 24 hours: Filed Vitals:   06/05/11 2253 06/06/11 0154 06/06/11 0500 06/06/11 0635  BP: 129/84 120/75  122/83  Pulse: 88 86  93  Temp:    98 F (36.7 C)  TempSrc:    Oral  Resp:    22  Height:      Weight:   54.5 kg (120 lb 2.4 oz)   SpO2:    96%    Intake/Output Summary (Last 24 hours) at 06/06/11 0854 Last data filed at 06/06/11 0425  Gross per 24 hour  Intake   1360 ml  Output    301 ml  Net   1059 ml   Physical Exam:  Constitutional: Vital signs reviewed.  Patient was in moderate distress. Patient appeared agitated during exam. Head: Normocephalic and atraumatic Eyes: PERRL, EOMI, conjunctivae normal, No scleral icterus.  Neck: Supple, Trachea midline normal ROM, No JVD, mass, thyromegaly, or carotid bruit present.  Cardiovascular: Heart sound appreciated, but S1, S2 indistinguishable.  Pulmonary/Chest: significantly decreased breath sounds bilaterally.  No wheezes, rales, or rhonchi. Abdominal: Soft. Non-tender, non-distended, bowel sounds are normal. Neurological: A&O x3, cranial nerve II-XII are grossly intact, no focal motor deficit, sensory intact to light touch bilaterally.  Skin: Warm, dry and intact. No rash, cyanosis, or clubbing.    Lab Results: Basic Metabolic Panel:  Lab 06/05/11 6962 06/04/11 0127 06/03/11 2004  NA 140 139 --  K 3.5 4.0 --  CL 107 105 --  CO2 24 25 --  GLUCOSE 139* 129* --  BUN 14 17 --  CREATININE 0.38* 0.46* --  CALCIUM 8.5 8.3* --  MG -- -- 1.9  PHOS -- -- --   Liver Function Tests:  Lab 06/04/11 1000 06/03/11 1117  AST -- 111*  ALT -- 90*  ALKPHOS -- 163*  BILITOT -- 0.3  PROT --  7.5  ALBUMIN 2.8* 3.6   CBC:  Lab 06/05/11 0930 06/04/11 0127 06/03/11 1117  WBC 6.3 15.1* --  NEUTROABS -- -- 5.9  HGB 12.0 13.3 --  HCT 36.4 40.3 --  MCV 102.8* 101.8* --  PLT 107* 138* --   Cardiac Enzymes:  Lab 06/05/11 0930 06/04/11 1015 06/04/11 0126 06/03/11 2003  CKTOTAL -- 259* 289* 292*  CKMB -- 14.3* 16.6* 18.7*  CKMBINDEX -- -- -- --  TROPONINI 1.29* 3.17* 3.97* --   Coagulation:  Lab 06/03/11 1117  LABPROT 15.1  INR 1.17   Anemia Panel:  Lab 06/03/11 2004  VITAMINB12 992*  FOLATE --  FERRITIN --  TIBC --  IRON --  RETICCTPCT --   Urine Drug Screen: Drugs of Abuse     Component Value Date/Time   LABOPIA NONE DETECTED 06/03/2011 1204   LABOPIA NEGATIVE 02/08/2011 0732   COCAINSCRNUR NONE DETECTED 06/03/2011 1204   COCAINSCRNUR NEGATIVE 02/08/2011 0732   LABBENZ POSITIVE* 06/03/2011 1204   LABBENZ POSITIVE* 02/08/2011 0732   AMPHETMU NONE DETECTED 06/03/2011 1204   AMPHETMU NEGATIVE 02/08/2011 0732   THCU NONE DETECTED 06/03/2011 1204   LABBARB NONE DETECTED 06/03/2011 1204    Alcohol Level:  Lab 06/03/11 1209  ETH <11   Urinalysis:  Lab 06/04/11 0122 06/03/11  1204  COLORURINE AMBER* AMBER*  LABSPEC 1.029 1.026  PHURINE 5.5 6.0  GLUCOSEU NEGATIVE NEGATIVE  HGBUR NEGATIVE NEGATIVE  BILIRUBINUR SMALL* SMALL*  KETONESUR 15* NEGATIVE  PROTEINUR 30* 30*  UROBILINOGEN 1.0 1.0  NITRITE NEGATIVE NEGATIVE  LEUKOCYTESUR NEGATIVE NEGATIVE    Micro Results: Recent Results (from the past 240 hour(s))  CULTURE, BLOOD (ROUTINE X 2)     Status: Normal (Preliminary result)   Collection Time   06/03/11  6:34 PM      Component Value Range Status Comment   Specimen Description BLOOD HAND LEFT   Final    Special Requests     Final    Value: BOTTLES DRAWN AEROBIC AND ANAEROBIC 4CC RED 8CC BLUE   Culture  Setup Time [id number]   Final    Culture     Final    Value:        BLOOD CULTURE RECEIVED NO GROWTH TO DATE CULTURE WILL BE HELD FOR 5 DAYS BEFORE ISSUING  A FINAL NEGATIVE REPORT   Report Status PENDING   Incomplete   CULTURE, BLOOD (ROUTINE X 2)     Status: Normal (Preliminary result)   Collection Time   06/03/11  6:36 PM      Component Value Range Status Comment   Specimen Description BLOOD HAND RIGHT   Final    Special Requests BOTTLES DRAWN AEROBIC AND ANAEROBIC 10CC EA   Final    Culture  Setup Time [id number]   Final    Culture     Final    Value:        BLOOD CULTURE RECEIVED NO GROWTH TO DATE CULTURE WILL BE HELD FOR 5 DAYS BEFORE ISSUING A FINAL NEGATIVE REPORT   Report Status PENDING   Incomplete   MRSA PCR SCREENING     Status: Normal   Collection Time   06/03/11  8:09 PM      Component Value Range Status Comment   MRSA by PCR NEGATIVE  NEGATIVE  Final   CULTURE, BLOOD (ROUTINE X 2)     Status: Normal   Collection Time   06/04/11  1:00 AM      Component Value Range Status Comment   Specimen Description BLOOD RIGHT ARM   Final    Special Requests BOTTLES DRAWN AEROBIC AND ANAEROBIC 5CC EA   Final    Culture  Setup Time [id number]   Final    Culture NO GROWTH 5 DAYS   Final    Report Status 06/05/2011 FINAL   Final   CULTURE, BLOOD (ROUTINE X 2)     Status: Normal (Preliminary result)   Collection Time   06/04/11  1:05 AM      Component Value Range Status Comment   Specimen Description BLOOD RIGHT HAND   Final    Special Requests BOTTLES DRAWN AEROBIC AND ANAEROBIC 5CC EA   Final    Culture  Setup Time [id number]   Final    Culture     Final    Value:        BLOOD CULTURE RECEIVED NO GROWTH TO DATE CULTURE WILL BE HELD FOR 5 DAYS BEFORE ISSUING A FINAL NEGATIVE REPORT   Report Status PENDING   Incomplete   URINE CULTURE     Status: Normal   Collection Time   06/04/11  1:23 AM      Component Value Range Status Comment   Specimen Description URINE, CATHETERIZED   Final    Special Requests NONE   Final  Culture  Setup Time [id number]   Final    Colony Count NO GROWTH   Final    Culture NO GROWTH   Final    Report  Status 06/04/2011 FINAL   Final    Studies/Results: Dg Chest 2 View  06/04/2011  *RADIOLOGY REPORT*  Clinical Data: Cough with shortness of breath.  Evaluate for aspiration pneumonia  CHEST - 2 VIEW  Comparison: 06/03/2011 and 02/08/2011  Findings: Changes of underlying COPD are identified with a stable degree of hyperinflation.  Heart size is within normal limits and a stable mediastinal contour is seen.  The lung fields are notable for increased interstitial markings involving the posterior and apical segments of the left lower lobe. Interstitial septal lines, mild prominence of the fissures and a small left pleural effusion are noted. Some mild improvement is noted in comparison with the prior exam and these findings as well as some increased delineation to the vascular markings in the right hemithorax suggests that this may be the result of improving asymmetric pulmonary edema rather than due to an infiltrative process. This would be even more suspicious is if the patient typically lies with the left side down. An area of left lower lobe pneumonia is not completely ruled out and continued follow-up to clearing would be recommended.  Bony structures appear intact with stable healed left-sided rib fractures.  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  As left lower lobe pneumonia could still be present, follow-up to clearing is recommended.  Original Report Authenticated By: Bertha Stakes, M.D.   Medications:   I have reviewed patient's medication. Scheduled Meds:    . aspirin  324 mg Oral Pre-Cath  . aspirin  81 mg Oral Daily  . diazepam  5 mg Oral On Call  . feeding supplement  237 mL Oral BID  . folic acid  1 mg Oral Daily  . heparin subcutaneous  5,000 Units Subcutaneous Q8H  . levETIRAcetam  500 mg Oral Daily  .  metoprolol tartrate  50 mg Oral BID  . mulitivitamin with minerals  1 tablet Oral Daily  . nicotine  21 mg Transdermal Daily   Followed by  . nicotine  14 mg Transdermal Daily   Followed by  . nicotine  7 mg Transdermal Daily  . pantoprazole  40 mg Oral Q1200  . phenytoin  200 mg Oral BID  . sodium chloride  3 mL Intravenous Q12H  . thiamine  100 mg Oral Daily   Or  . thiamine  100 mg Intravenous Daily  . DISCONTD: albuterol  2.5 mg Nebulization Q4H  . DISCONTD: ipratropium  0.5 mg Nebulization Q4H  . DISCONTD: metoprolol  2.5 mg Intravenous Q6H  . DISCONTD: pantoprazole (PROTONIX) IV  40 mg Intravenous QHS  . DISCONTD: piperacillin-tazobactam (ZOSYN)  IV  3.375 g Intravenous Q8H   Continuous Infusions:    . sodium chloride 50 mL/hr at 06/06/11 0425  . DISCONTD: sodium chloride 100 mL/hr at 06/05/11 0800   PRN Meds:.sodium chloride, albuterol, ipratropium, LORazepam, LORazepam, sodium chloride, DISCONTD: sodium chloride, DISCONTD: acetaminophen, DISCONTD: labetalol  Assessment/Plan:  1. Epilepsy s/p seizure: Patient has a substantial history of neurologic diseases such as epilepsy, meningitis, subependymal abscess status post ventricular shunt, and craniotomy.  Pt's Dilantin level is therapeutic but given reported history of recent alcohol consumption, this had likely lowered her seizure threshold level, leading  to seizure. Pt experienced witnessed seizure on exam s/p Ativan 2 mg with resolution of seizure. Although she has a history of cocaine abuse her urine drug screen was negative for cocaine. TIA is high on differential given MRI findings. It is probably secondary to hemodynamic changes caused by seizure. Patient had DT on 2/6, ativan was given.  PLAN  -Patient has been stable.   -continue home dose of Dilantin  -continue Keppra 500mg  on 2/4 -CIWA protocol--> change to escalating ativan on 2/6   -Thiamine, folate given alcohol Hx. -Neurology on consulting, appreciate  recommendations  -Seizure precautions  -blood culture x2-no growth to date  2. Respiratory Distress: Pt exhibiting labored breathing on exam with prolonged expiratory phase. This is probably secondary to either STEMI or TIA.  This condition has resolved by the morning of 2/5. Hx significant for COPD with respiratory failure thus threshold for intubation is low in this case.  On admission, she oxygenated adequately with O2>90% on 2.5 L oxygen. O2 was increased to 4Ls overnight.  ABG showed respiratory acidosis with metabolic compensation. Since her respiratory distress has improved.  O2 was weaned successfully. However, she continued to breath using accessory muscles.  Due to high likelihood for aspiration penumo based on her seizure history, she was started on Zosyn on 2/5.  2 view CXR done on 2/6 has ruled out aspiration pneumonia.  Thus, zosyn was stopped.  Better long term control of patient COPD is needed.  Follow up appointment with Dr. Marchelle Gearing at [facility] has been made.  PLAN  -continue Spiriva and advair PRN -Albuterol PRN -PPI ppx for stress ulcer prophylasix   3.Question of CVA: new stroke is not definite on imagining.  Neurology has assessed patient. She is not a tPA candidate given on-going seizure activity. Patient has probably had a TIA, instead of a CVA due to the resolution of her right-sided weakness and preferential gaze.  Patient has been stable.    PLAN  -ASA 81 mg daily given probable TIA -continue close monitoring, appreciate neurology recommendations   4. EKG changes: Patient presented to the ED with ST elevation in V3~V5. Troponin level has been trending down,  4.12--> 2.45--> 3.97--> 3.17--> 1.29.  Patient's last echo was done in Oct 2012, which showed EF 65% with no wall motion abnormalities.  Her EKG on 2/6 showed resolving ST-elevation, but T wave inversion and QTc of 523. Repeat echo showed EF 30~35% and akinesis in mid-distalanteroseptal, anterior, anterior lateral,  inferiour, and apical myocardium, consistent with infarction in the distribution of the LAD. Cardiac cath done on 2/7 has showed mild non-obstructive CAD (30% mid vessel stenosis in LAD and  40% mid vessel stenosis in RCA) and non-ischemic cardiomyopathy, which could be caused by alcohol and drug abuse as well as possible hypotension during patient's 10-min seizure.  PLAN  -EKG in am  -Start lisinopril 10mg  daily, goal is 40mg . -Start Simvastatin 20mg  daily -Cannot give heparin drip due to TIA.  -D/C Lovenox -Discharge tomorrow if stable  5. Polysubstance abuse: pt continues to smoke cigarettes and drink alcohol. Patient's fidgety Patient has agreed to using a nicotine patch while inpatient. Patient appeared to have DT on 2/6. PLAN  -social work consult     LOS: 3 days   Maren Beach 06/06/2011, 8:54 AM

## 2011-06-07 ENCOUNTER — Other Ambulatory Visit: Payer: Self-pay

## 2011-06-07 DIAGNOSIS — I2510 Atherosclerotic heart disease of native coronary artery without angina pectoris: Secondary | ICD-10-CM | POA: Diagnosis present

## 2011-06-07 DIAGNOSIS — G40309 Generalized idiopathic epilepsy and epileptic syndromes, not intractable, without status epilepticus: Secondary | ICD-10-CM

## 2011-06-07 MED ORDER — ALBUTEROL SULFATE HFA 108 (90 BASE) MCG/ACT IN AERS: 1.0000 | INHALATION_SPRAY | Freq: Four times a day (QID) | RESPIRATORY_TRACT | Status: DC | PRN

## 2011-06-07 MED ORDER — NICOTINE 14 MG/24HR TD PT24: 1.0000 | MEDICATED_PATCH | Freq: Every day | TRANSDERMAL | Status: AC

## 2011-06-07 MED ORDER — ASPIRIN 81 MG PO CHEW: 81.0000 mg | CHEWABLE_TABLET | Freq: Every day | ORAL | Status: AC

## 2011-06-07 MED ORDER — ZOLPIDEM TARTRATE 5 MG PO TABS: 5.0000 mg | ORAL_TABLET | Freq: Once | ORAL | Status: DC

## 2011-06-07 MED ORDER — METOPROLOL TARTRATE 50 MG PO TABS: 50.0000 mg | ORAL_TABLET | Freq: Two times a day (BID) | ORAL | Status: DC

## 2011-06-07 MED ORDER — NICOTINE 21 MG/24HR TD PT24: 1.0000 | MEDICATED_PATCH | Freq: Every day | TRANSDERMAL | Status: AC

## 2011-06-07 MED ORDER — FLUTICASONE-SALMETEROL 250-50 MCG/DOSE IN AEPB: 1.0000 | INHALATION_SPRAY | Freq: Two times a day (BID) | RESPIRATORY_TRACT | Status: DC

## 2011-06-07 MED ORDER — NICOTINE 7 MG/24HR TD PT24: 1.0000 | MEDICATED_PATCH | Freq: Every day | TRANSDERMAL | Status: DC

## 2011-06-07 MED ORDER — ADULT MULTIVITAMIN W/MINERALS CH: 1.0000 | ORAL_TABLET | Freq: Every day | ORAL | Status: AC

## 2011-06-07 MED ORDER — TIOTROPIUM BROMIDE MONOHYDRATE 18 MCG IN CAPS: 18.0000 ug | ORAL_CAPSULE | Freq: Every day | RESPIRATORY_TRACT | Status: DC

## 2011-06-07 MED ORDER — FOLIC ACID 1 MG PO TABS: 1.0000 mg | ORAL_TABLET | Freq: Every day | ORAL | Status: DC

## 2011-06-07 MED ORDER — THIAMINE HCL 100 MG PO TABS: 100.0000 mg | ORAL_TABLET | Freq: Every day | ORAL | Status: DC

## 2011-06-07 MED ORDER — NICOTINE 14 MG/24HR TD PT24: 1.0000 | MEDICATED_PATCH | Freq: Every day | TRANSDERMAL | Status: DC

## 2011-06-07 MED ORDER — NICOTINE 21 MG/24HR TD PT24: 1.0000 | MEDICATED_PATCH | Freq: Every day | TRANSDERMAL | Status: DC

## 2011-06-07 MED ORDER — NICOTINE 7 MG/24HR TD PT24: 1.0000 | MEDICATED_PATCH | Freq: Every day | TRANSDERMAL | Status: AC

## 2011-06-07 MED ORDER — FLUTICASONE-SALMETEROL 250-50 MCG/DOSE IN AEPB
1.0000 | INHALATION_SPRAY | Freq: Two times a day (BID) | RESPIRATORY_TRACT | Status: DC
  Administered 2011-06-07: 1 via RESPIRATORY_TRACT
  Filled 2011-06-07: qty 14

## 2011-06-07 MED ORDER — SIMVASTATIN 20 MG PO TABS: 20.0000 mg | ORAL_TABLET | Freq: Every day | ORAL | Status: DC

## 2011-06-07 MED ORDER — PHENYTOIN SODIUM EXTENDED 200 MG PO CAPS: 200.0000 mg | ORAL_CAPSULE | Freq: Two times a day (BID) | ORAL | Status: DC

## 2011-06-07 MED ORDER — LISINOPRIL 10 MG PO TABS: 10.0000 mg | ORAL_TABLET | Freq: Every day | ORAL | Status: DC

## 2011-06-07 MED ORDER — LEVETIRACETAM 500 MG PO TABS: 500.0000 mg | ORAL_TABLET | Freq: Every day | ORAL | Status: DC

## 2011-06-07 NOTE — Progress Notes (Signed)
Clinical Social Work-CSW met with pt prior to d/c to complete psychosocial assessment. Full assessment in shadow chart. CSW and pt discussed pt ETOH and Substance use. CSW provided resource packet including AA meeting schedule/CDIOP and educational support groups. Pt plan to d/c to mothers house and will contact CSW if any other needs arise. Jodean Lima, [phone]

## 2011-06-07 NOTE — Progress Notes (Signed)
No groin hematoma post cath. Pulses good.   Cath findings of nonobstructive CAD.  Recommend risk factor modification.  Cardiology will sign off.   Peter Swaziland MD, [facility]  06/07/11

## 2011-06-07 NOTE — Progress Notes (Signed)
13 woman adm several days ago for seizure after binge drinking.  Has hx of serious brain infection and has known epilepsy.  This time her dilantin level was 14 on adm so withdrawal is probably the cause.  Team has decided to add keppra and she is advised to consider her drinking as a problem.  She is willing to acknowledge a prior but not a current problem with drinking.  Agree with discharge plans.

## 2011-06-07 NOTE — Progress Notes (Signed)
Subjective:  Patient appeared calm and comfortable this morning.  No events overnight.  No bleeding at the cath insertion site. Patient had a mild HA. She denied CP and SOB.  She stated that she will live with her mother for a while after discharge.    Objective: Vital signs in last 24 hours: Filed Vitals:   06/06/11 2309 06/07/11 0300 06/07/11 0849 06/07/11 1010  BP: 104/71 111/71  105/67  Pulse: 79 81  96  Temp: 97.4 F (36.3 C) 97.3 F (36.3 C)  97.9 F (36.6 C)  TempSrc: Oral Axillary  Oral  Resp: 21 19  18   Height:      Weight:      SpO2: 100% 98% 98% 94%    Intake/Output Summary (Last 24 hours) at 06/07/11 1303 Last data filed at 06/07/11 0900  Gross per 24 hour  Intake    360 ml  Output      0 ml  Net    360 ml   Physical Exam:  Constitutional: Vital signs reviewed.  Patient was in no acute distress.  Head: Normocephalic and atraumatic Eyes: PERRL, EOMI, conjunctivae normal, No scleral icterus.  Neck: Supple, Trachea midline normal ROM, No JVD, mass, thyromegaly, or carotid bruit present.  Cardiovascular: Heart sound appreciated, but S1, S2 indistinguishable.  Pulmonary/Chest: significantly decreased breath sounds bilaterally.  No wheezes, rales, or rhonchi. Abdominal: Soft. Non-tender, non-distended, bowel sounds are normal. Neurological: A&O x3, cranial nerve II-XII are grossly intact, no focal motor deficit, sensory intact to light touch bilaterally.  Skin: Warm, dry and intact. No rash, cyanosis, or clubbing.    Lab Results: Basic Metabolic Panel:  Lab 06/05/11 2130 06/04/11 0127 06/03/11 2004  NA 140 139 --  K 3.5 4.0 --  CL 107 105 --  CO2 24 25 --  GLUCOSE 139* 129* --  BUN 14 17 --  CREATININE 0.38* 0.46* --  CALCIUM 8.5 8.3* --  MG -- -- 1.9  PHOS -- -- --   Liver Function Tests:  Lab 06/04/11 1000 06/03/11 1117  AST -- 111*  ALT -- 90*  ALKPHOS -- 163*  BILITOT -- 0.3  PROT -- 7.5  ALBUMIN 2.8* 3.6   CBC:  Lab 06/05/11 0930 06/04/11  0127 06/03/11 1117  WBC 6.3 15.1* --  NEUTROABS -- -- 5.9  HGB 12.0 13.3 --  HCT 36.4 40.3 --  MCV 102.8* 101.8* --  PLT 107* 138* --   Cardiac Enzymes:  Lab 06/05/11 0930 06/04/11 1015 06/04/11 0126 06/03/11 2003  CKTOTAL -- 259* 289* 292*  CKMB -- 14.3* 16.6* 18.7*  CKMBINDEX -- -- -- --  TROPONINI 1.29* 3.17* 3.97* --   Coagulation:  Lab 06/03/11 1117  LABPROT 15.1  INR 1.17   Anemia Panel:  Lab 06/03/11 2004  VITAMINB12 992*  FOLATE --  FERRITIN --  TIBC --  IRON --  RETICCTPCT --   Urine Drug Screen: Drugs of Abuse     Component Value Date/Time   LABOPIA NONE DETECTED 06/03/2011 1204   LABOPIA NEGATIVE 02/08/2011 0732   COCAINSCRNUR NONE DETECTED 06/03/2011 1204   COCAINSCRNUR NEGATIVE 02/08/2011 0732   LABBENZ POSITIVE* 06/03/2011 1204   LABBENZ POSITIVE* 02/08/2011 0732   AMPHETMU NONE DETECTED 06/03/2011 1204   AMPHETMU NEGATIVE 02/08/2011 0732   THCU NONE DETECTED 06/03/2011 1204   LABBARB NONE DETECTED 06/03/2011 1204    Alcohol Level:  Lab 06/03/11 1209  ETH <11   Urinalysis:  Lab 06/04/11 0122 06/03/11 1204  COLORURINE AMBER* AMBER*  LABSPEC 1.029 1.026  PHURINE 5.5 6.0  GLUCOSEU NEGATIVE NEGATIVE  HGBUR NEGATIVE NEGATIVE  BILIRUBINUR SMALL* SMALL*  KETONESUR 15* NEGATIVE  PROTEINUR 30* 30*  UROBILINOGEN 1.0 1.0  NITRITE NEGATIVE NEGATIVE  LEUKOCYTESUR NEGATIVE NEGATIVE    Micro Results: Recent Results (from the past 240 hour(s))  CULTURE, BLOOD (ROUTINE X 2)     Status: Normal (Preliminary result)   Collection Time   06/03/11  6:34 PM      Component Value Range Status Comment   Specimen Description BLOOD HAND LEFT   Final    Special Requests     Final    Value: BOTTLES DRAWN AEROBIC AND ANAEROBIC 4CC RED 8CC BLUE   Culture  Setup Time [id number]   Final    Culture     Final    Value:        BLOOD CULTURE RECEIVED NO GROWTH TO DATE CULTURE WILL BE HELD FOR 5 DAYS BEFORE ISSUING A FINAL NEGATIVE REPORT   Report Status PENDING   Incomplete    CULTURE, BLOOD (ROUTINE X 2)     Status: Normal (Preliminary result)   Collection Time   06/03/11  6:36 PM      Component Value Range Status Comment   Specimen Description BLOOD HAND RIGHT   Final    Special Requests BOTTLES DRAWN AEROBIC AND ANAEROBIC 10CC EA   Final    Culture  Setup Time [id number]   Final    Culture     Final    Value:        BLOOD CULTURE RECEIVED NO GROWTH TO DATE CULTURE WILL BE HELD FOR 5 DAYS BEFORE ISSUING A FINAL NEGATIVE REPORT   Report Status PENDING   Incomplete   MRSA PCR SCREENING     Status: Normal   Collection Time   06/03/11  8:09 PM      Component Value Range Status Comment   MRSA by PCR NEGATIVE  NEGATIVE  Final   CULTURE, BLOOD (ROUTINE X 2)     Status: Normal   Collection Time   06/04/11  1:00 AM      Component Value Range Status Comment   Specimen Description BLOOD RIGHT ARM   Final    Special Requests BOTTLES DRAWN AEROBIC AND ANAEROBIC 5CC EA   Final    Culture  Setup Time [id number]   Final    Culture NO GROWTH 5 DAYS   Final    Report Status 06/05/2011 FINAL   Final   CULTURE, BLOOD (ROUTINE X 2)     Status: Normal (Preliminary result)   Collection Time   06/04/11  1:05 AM      Component Value Range Status Comment   Specimen Description BLOOD RIGHT HAND   Final    Special Requests BOTTLES DRAWN AEROBIC AND ANAEROBIC 5CC EA   Final    Culture  Setup Time [id number]   Final    Culture     Final    Value:        BLOOD CULTURE RECEIVED NO GROWTH TO DATE CULTURE WILL BE HELD FOR 5 DAYS BEFORE ISSUING A FINAL NEGATIVE REPORT   Report Status PENDING   Incomplete   URINE CULTURE     Status: Normal   Collection Time   06/04/11  1:23 AM      Component Value Range Status Comment   Specimen Description URINE, CATHETERIZED   Final    Special Requests NONE   Final    Culture  Setup  Time [id number]   Final    Colony Count NO GROWTH   Final    Culture NO GROWTH   Final    Report Status 06/04/2011 FINAL   Final    Studies/Results: No results  found. Medications:   I have reviewed patient's medication. Scheduled Meds:    . aspirin  81 mg Oral Daily  . feeding supplement  237 mL Oral BID  . Fluticasone-Salmeterol  1 puff Inhalation BID  . folic acid  1 mg Oral Daily  . levETIRAcetam  500 mg Oral Daily  . lisinopril  10 mg Oral Daily  . metoprolol tartrate  50 mg Oral BID  . mulitivitamin with minerals  1 tablet Oral Daily  . nicotine  21 mg Transdermal Daily   Followed by  . nicotine  14 mg Transdermal Daily   Followed by  . nicotine  7 mg Transdermal Daily  . pantoprazole  40 mg Oral Q1200  . phenytoin  200 mg Oral BID  . simvastatin  20 mg Oral q1800  . thiamine  100 mg Oral Daily   Or  . thiamine  100 mg Intravenous Daily  . zolpidem  5 mg Oral Once   Continuous Infusions:   PRN Meds:.acetaminophen, albuterol, guaiFENesin-dextromethorphan, ipratropium, LORazepam, LORazepam, ondansetron (ZOFRAN) IV  Assessment/Plan:  Patient has been stable. She will be discharged this afternoon.  1. Epilepsy s/p seizure: Patient has a substantial history of neurologic diseases such as epilepsy, meningitis, subependymal abscess status post ventricular shunt, and craniotomy.  Pt's Dilantin level is therapeutic but given reported history of recent alcohol consumption, this had likely lowered her seizure threshold level, leading to seizure. Pt experienced witnessed seizure on exam s/p Ativan 2 mg with resolution of seizure. Although she has a history of cocaine abuse her urine drug screen was negative for cocaine. TIA is high on differential given MRI findings. It is probably secondary to hemodynamic changes caused by seizure. Patient had DT on 2/6, ativan was given. F/u appointment with Dr. Andrey Campanile at [facility] has been made.  PLAN  -Patient has been stable.   -continue home dose of Dilantin  -continue Keppra 500mg  on 2/4 -CIWA protocol--> change to escalating ativan on 2/6   -Thiamine, folate given alcohol Hx. -Neurology on  consulting, appreciate recommendations  -Seizure precautions  -blood culture x2-no growth to date  2. Respiratory Distress: Pt exhibiting labored breathing on exam with prolonged expiratory phase. This is probably secondary to either STEMI or TIA.  This condition has resolved by the morning of 2/5. Hx significant for COPD with respiratory failure thus threshold for intubation is low in this case.  On admission, she oxygenated adequately with O2>90% on 2.5 L oxygen. O2 was increased to 4Ls overnight.  ABG showed respiratory acidosis with metabolic compensation. Since her respiratory distress has improved.  O2 was weaned successfully. However, she continued to breath using accessory muscles.  Due to high likelihood for aspiration penumo based on her seizure history, she was started on Zosyn on 2/5.  2 view CXR done on 2/6 has ruled out aspiration pneumonia.  Thus, zosyn was stopped.  Better long term control of patient COPD is needed.  Follow up appointment with Dr. Marchelle Gearing at [facility] has been made.  PLAN  -continue Spiriva and advair PRN -Albuterol PRN -PPI ppx for stress ulcer prophylasix   3.Question of CVA: new stroke is not definite on imagining.  Neurology has assessed patient. She is not a tPA candidate given on-going seizure activity. Patient  has probably had a TIA, instead of a CVA due to the resolution of her right-sided weakness and preferential gaze.  Patient has been stable.    PLAN  -ASA 81 mg daily given probable TIA -continue close monitoring, appreciate neurology recommendations   4. EKG changes: Patient presented to the ED with ST elevation in V3~V5. Troponin level has been trending down,  4.12--> 2.45--> 3.97--> 3.17--> 1.29.  Patient's last echo was done in Oct 2012, which showed EF 65% with no wall motion abnormalities.  Her EKG on 2/6 showed resolving ST-elevation, but T wave inversion and QTc of 523. Repeat echo showed EF 30~35% and akinesis in mid-distalanteroseptal, anterior,  anterior lateral, inferiour, and apical myocardium, consistent with infarction in the distribution of the LAD. Cardiac cath done on 2/7 has showed mild non-obstructive CAD (30% mid vessel stenosis in LAD and  40% mid vessel stenosis in RCA) and non-ischemic cardiomyopathy, which could be caused by alcohol and drug abuse as well as possible hypotension during patient's 10-min seizure. On discharge, patient's EKG showed T wave inversion in anterior lateral lead and ST elevation had mostly resolved. PLAN  -Continue lisinopril 10mg  daily, goal is 40mg . -Continue Simvastatin 20mg  daily -Cannot give heparin drip due to TIA.    5. Polysubstance abuse: pt continues to smoke cigarettes and drink alcohol. Patient's fidgety Patient has agreed to using a nicotine patch while inpatient. Patient appeared to have DT on 2/6. PLAN  -social work consult     LOS: 4 days   Maren Beach 06/07/2011, 1:03 PM

## 2011-06-07 NOTE — Progress Notes (Signed)
Speech Language/Pathology Speech Pathology: Dysphagia Treatment Note  Patient was observed with : Regular and Thin liquids.  Patient was noted to have s/s of aspiration : No:    Lung Sounds:  Diminished  Temperature: afebrile  SLP observed pt independently and appropriately consuming breakfast on edge of bed. No s/s of aspiration observed.   Clinical Impression: Pts mentation appropriate for PO consumption. No s/s of aspiration observed. Pt is safe to consume regular diet with thin liquids. No f/u needed.    Pain:   none Intervention Required:   Yes SOB, Rn informed, called respiratory for breathing treatment.   Goals: All Goals Met Harlon Ditty, MA CCC-SLP [phone]

## 2011-06-07 NOTE — Progress Notes (Signed)
Pt. discharged to home with sister as pt is to live with her mother for awhile after d/c. Allowed pt sister to speak with physician. Pt after visit summary reviewed and pt capable of re verbalizing medications and follow up appointments. Pt remains stable. No signs and symptoms of distress. Educated to return to ER in the event of SOB, dizziness, chest pain, or fainting. Burley Saver, RN

## 2011-06-07 NOTE — Discharge Planning (Signed)
Internal Medicine Teaching [facility] Discharge Note  Name: LINNETTE PANELLA MRN: [id number] DOB: 1956/01/03 56 y.o.  Date of Admission: 06/03/2011 11:09 AM Date of Discharge: 06/07/2011 Attending Physician: Rocco Serene, MD  Discharge Diagnosis: 1. Epilepsy s/p Tonic-Clonic Seizure 2. STEMI, anterior 3. Non-ischemic cardiomyopathy  4. 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 Transient Ischemic Attack 6. Polysubstance abuse (tobacco, alcohol, cocaine.) 7. Acute Delirium 8. Alcohol Withdrwal 9. Respiratory Distress, hypoxic  Discharge Medications: Medication List  As of 06/07/2011  1:18 PM   STOP taking these medications         albuterol (2.5 MG/3ML) 0.083% nebulizer solution      benazepril 20 MG tablet      docusate sodium 100 MG capsule      hydrochlorothiazide 12.5 MG capsule      ibuprofen 600 MG tablet      ipratropium-albuterol 0.5-2.5 (3) MG/3ML Soln         TAKE these medications         albuterol 108 (90 BASE) MCG/ACT inhaler   Commonly known as: PROVENTIL HFA;VENTOLIN HFA   Inhale 1 puff into the lungs every 6 (six) hours as needed. For shortness of breath.      aspirin 81 MG chewable tablet   Chew 1 tablet (81 mg total) by mouth daily.      Fluticasone-Salmeterol 250-50 MCG/DOSE Aepb   Commonly known as: ADVAIR   Inhale 1 puff into the lungs every 12 (twelve) hours.      folic acid 1 MG tablet   Commonly known as: FOLVITE   Take 1 tablet (1 mg total) by mouth daily.      levETIRAcetam 500 MG tablet   Commonly known as: KEPPRA   Take 1 tablet (500 mg total) by mouth daily.      lisinopril 10 MG tablet   Commonly known as: PRINIVIL,ZESTRIL   Take 1 tablet (10 mg total) by mouth daily.      metoprolol 50 MG tablet   Commonly known as: LOPRESSOR   Take 1 tablet (50 mg total) by mouth 2 (two) times daily.      mulitivitamin with minerals Tabs   Take 1 tablet by mouth daily.      nicotine 21 mg/24hr patch   Commonly known as: NICODERM CQ - dosed in mg/24  hours   Place 1 patch onto the skin daily.      nicotine 14 mg/24hr patch   Commonly known as: NICODERM CQ - dosed in mg/24 hours   Place 1 patch onto the skin daily.   Start taking on: 07/19/2011      nicotine 7 mg/24hr patch   Commonly known as: NICODERM CQ - dosed in mg/24 hr   Place 1 patch onto the skin daily.   Start taking on: 08/02/2011      phenytoin 200 MG ER capsule   Commonly known as: DILANTIN   Take 1 capsule (200 mg total) by mouth 2 (two) times daily.      simvastatin 20 MG tablet   Commonly known as: ZOCOR   Take 1 tablet (20 mg total) by mouth daily at 6 PM.      thiamine 100 MG tablet   Take 1 tablet (100 mg total) by mouth daily.      tiotropium 18 MCG inhalation capsule   Commonly known as: SPIRIVA   Place 1 capsule (18 mcg total) into inhaler and inhale daily.      VITAMIN D (ERGOCALCIFEROL) PO   Take  1 tablet by mouth daily.            Disposition and follow-up:   Ms.Elyna TANEYA CONKEL was discharged from [facility] in Stable condition.    Follow-up Appointments: Follow-up Information    Follow up with [facility] on 06/13/2011. (Post-hosp f/u with Dr. Andrey Campanile, need to fax H&P and D/C notes to [phone])       Follow up with [facility], MD on 06/24/2011. (Pulmonologist: Dr. Marchelle Gearing, appointment at [facility],  please arrive early)    Contact information:   [address] [place] [address] [phone]         Discharge Orders    Future Appointments: Provider: Department: Dept Phone: Center:   06/24/2011 9:00 AM Kalman Shan, MD [facility] [phone] None     Future Orders Please Complete By Expires   Increase activity slowly         Consultations:  Neurology Pulmonary Critical Care Cardiology  Case Management Social Work    Procedures Performed:  Dg Chest 2 View  06/04/2011  *RADIOLOGY REPORT*  Clinical Data: Cough with shortness of breath.  Evaluate for aspiration  pneumonia  CHEST - 2 VIEW  Comparison: 06/03/2011 and 02/08/2011  Findings: Changes of underlying COPD are identified with a stable degree of hyperinflation.  Heart size is within normal limits and a stable mediastinal contour is seen.  The lung fields are notable for increased interstitial markings involving the posterior and apical segments of the left lower lobe. Interstitial septal lines, mild prominence of the fissures and a small left pleural effusion are noted. Some mild improvement is noted in comparison with the prior exam and these findings as well as some increased delineation to the vascular markings in the right hemithorax suggests that this may be the result of improving asymmetric pulmonary edema rather than due to an infiltrative process. This would be even more suspicious is if the patient typically lies with the left side down. An area of left lower lobe pneumonia is not completely ruled out and continued follow-up to clearing would be recommended.  Bony structures appear intact with stable healed left-sided rib fractures.  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  As left lower lobe pneumonia could still be present, follow-up to clearing is recommended.  Original Report Authenticated By: Bertha Stakes, M.D.   Ct Head Wo Contrast  06/03/2011  *RADIOLOGY REPORT*  Clinical Data: Altered mental status.  Seizure  CT HEAD WITHOUT CONTRAST  Technique:  Contiguous axial images were obtained from the base of the skull through the vertex without contrast.  Comparison: Multiple prior studies.  The most recent CT was 03-07-11.  CT angiogram 07/28/2005  Findings: Prior right frontal craniotomy.  Right frontal ventricular shunt catheter extends into the third ventricle and is unchanged.  Right lateral ventricle is decompressed and  slit-like. Left lateral ventricle is mildly enlarged with mild enlargement of the left temporal horn.  This is stable.  The fourth ventricle is mildly enlarged and this is unchanged.  No midline shift.  Encephalomalacia right frontal lobe is stable.  Left frontal hyperintensity is unchanged.  This is associated with small calcifications.  This appears to be a chronic process and may be related to chronic hemorrhage related to a vascular malformation. This may be a small AVM or venous malformation.  Abnormal vasculature is present in  this area on the CTA 07/28/2005.  No acute hemorrhage.  Hypodensity throughout the left cerebral white matter is stable and is compatible with chronic ischemia.  No acute infarct or mass.  Sinusitis with air-fluid levels in both maxillary sinuses.  IMPRESSION: No change from the prior CT.  The right lateral and third ventricles are decompressed with a catheter.  There is mild chronic enlargement of the left lateral ventricle and fourth ventricle.  Original Report Authenticated By: Camelia Phenes, M.D.   Dg Chest Port 1 View  06/03/2011  *RADIOLOGY REPORT*  Clinical Data: Respiratory distress  PORTABLE CHEST - 1 VIEW  Comparison: 02/13/2011  Findings: VP shunt tubing over the right hemithorax.  Heart size normal.  No effusion.  New interstitial and airspace infiltrates or edema in the left mid and lower lung and to a lesser at the right lung base.  Regional bones unremarkable. Bilateral rib fractures.  IMPRESSION:  1.  Asymmetric bibasilar infiltrates or atypical edema, left worse than right.  Original Report Authenticated By: Osa Craver, M.D.   Mr Brain Ltd W/o Cm  06/03/2011  *RADIOLOGY REPORT*  Clinical Data: Right-sided neglect.  Rule out CVA.  MRI HEAD WITHOUT CONTRAST  Technique:  Multiplanar, multiecho pulse sequences of the brain and surrounding structures were obtained according to standard protocol without intravenous contrast.  Comparison: CT head 06/03/2011   Findings: Study was limited to diffusion weighted imaging only.  A complete study was not performed.  Right frontal shunt catheter is in place causing significant artifact.   Encephalomalacia in the frontal lobes bilaterally.  Possible small area of restricted diffusion in the left medial thalamus which may represent acute infarct.  Correlate with clinical symptoms.  IMPRESSION: Limited study.  Possible small area of acute infarct left medial thalamus.  Original Report Authenticated By: Camelia Phenes, M.D.    2D Echo:  Transthoracic Echocardiography Patient: Caylee, Vlachos MR #: [id number] Study Date: 06/04/2011 Gender: F Age: 89 Height: 165.1cm Weight: 50.6kg BSA: 1.24m^2 Pt. Status: Room: 3107  PERFORMING Shvc SONOGRAPHER Silvano Bilis, RCS ADMITTING Doneen Poisson ATTENDING Doneen Poisson Sherrie Sport, [place] cc:  ------------------------------------------------------------ LV EF: 30% - 35%  ------------------------------------------------------------ Indications: MI - acute 410.91.  ------------------------------------------------------------ History: PMH: Altered mental status. Chronic obstructive pulmonary disease. Risk factors: Current tobacco use.  ------------------------------------------------------------ Study Conclusions  - Left ventricle: The cavity size was normal. Wall thickness was normal. Systolic function was moderately to severely reduced. The estimated ejection fraction was in the range of 30% to 35%. Akinesis of the mid-distalanteroseptal, anterior, anterolateral, inferior, and apical myocardium; consistent with infarction in the distribution of the left anterior descending coronary artery. Doppler parameters are consistent with abnormal left ventricular relaxation (grade 1 diastolic dysfunction). - Atrial septum: No defect or patent foramen ovale was identified. Transthoracic echocardiography. M-mode, complete 2D, spectral Doppler, and  color Doppler. Height: Height: 165.1cm. Height: 65in. Weight: Weight: 50.6kg. Weight: 111.3lb. Body mass index: BMI: 18.6kg/m^2. Body surface area: BSA: 1.97m^2. Blood pressure: 100/69. Patient status: Inpatient. Location: ICU/CCU  ------------------------------------------------------------  ------------------------------------------------------------ Left ventricle: The cavity size was normal. Wall thickness was normal. Systolic function was moderately to severely reduced. The estimated ejection fraction was in the range of 30% to 35%. Regional wall motion abnormalities: Akinesis of the mid-distalanteroseptal, anterior, anterolateral, inferior, and apical myocardium; consistent with infarction in the distribution of the left anterior descending coronary artery. Doppler parameters are consistent with abnormal left ventricular relaxation (grade 1 diastolic dysfunction).  ------------------------------------------------------------ Aortic valve: Poorly visualized. Trileaflet. Doppler:  There was no stenosis. No regurgitation.  ------------------------------------------------------------ Mitral valve: Structurally normal valve. Leaflet separation was normal. Doppler: Transvalvular velocity was within the normal range. There was no evidence for stenosis. No regurgitation. Peak gradient: 3mm Hg (D).  ------------------------------------------------------------ Left atrium: The atrium was normal in size.  ------------------------------------------------------------ Atrial septum: No defect or patent foramen ovale was identified.  ------------------------------------------------------------ Right ventricle: The cavity size was normal. Wall thickness was normal. Systolic function was normal.  ------------------------------------------------------------ Tricuspid valve: Poorly visualized. Doppler: No significant  regurgitation.  ------------------------------------------------------------ Pulmonary artery: Systolic pressure could not be accurately estimated.  ------------------------------------------------------------ Right atrium: The atrium was normal in size.  ------------------------------------------------------------ Pericardium: There was no pericardial effusion.  ------------------------------------------------------------ Systemic veins: Inferior vena cava: The vessel was dilated; the respirophasic diameter changes were blunted (< 50%); findings are consistent with elevated central venous pressure.  ------------------------------------------------------------  2D measurements Normal Doppler measurements Normal Left ventricle Mitral valve LVID ED, 37.7 mm 43-52 Peak E vel 83. cm/s ------ chord, 9 PLAX Peak A vel 90. cm/s ------ LVID ES, 24.9 mm 23-38 3 chord, Deceleration 180 ms 150-23 PLAX time 0 FS, chord, 34 % >29 Peak 3 mm ------ PLAX gradient, D Hg LVPW, ED 10.1 mm ------ Peak E/A 0.9 ------ IVS/LVPW 0.99 <1.3 ratio ratio, ED Right ventricle Ventricular septum Sa vel, lat 10. cm/s ------ IVS, ED 10 mm ------ ann, tiss DP 5 Aorta Root diam, 30 mm ------ ED Left atrium AP dim 28 mm ------ AP dim 1.85 cm/m^2 <2.2 index  ------------------------------------------------------------ Prepared and Electronically Authenticated by  Croitoru, Mihai 2013-02-06T12:07:53.073  Cardiac Cath:  2/7/20139:38 AM   Procedure Performed:  1. Left Heart Catheterization 2. Selective Coronary Angiography 3. Left ventricular angiogram Operator: Verne Carrow, MD  Indication: NSTEMI, anterior wall motion abnormality on echo, LV systolic dysfunction, EKG changes  Procedure Details:  The risks, benefits, complications, treatment options, and expected outcomes were discussed with the patient. The patient and/or family concurred with the proposed plan, giving informed consent.  The patient was brought to the cath lab after IV hydration was begun and oral premedication was given. The patient was further sedated with Versed and Fentanyl. The right groin was prepped and draped in the usual manner. Using the modified Seldinger access technique, a 5 French sheath was placed in the right femoral artery. Standard diagnostic catheters were used to perform selective coronary angiography. LV pressures were measured with the JR-4 catheter.  There were no immediate complications. The patient was taken to the recovery area in stable condition.  Hemodynamic Findings:  Central aortic pressure: 124/80  Left ventricular pressure: 127/22/29  Angiographic Findings:  Left main: No obstructive disease.  Left Anterior Descending Artery: Large vessel that courses to the apex. There is a tubular 30% stenosis in the mid vessel. The vessel does not reach the apex. Distally there is a bifurcation into a diagonal branch and a small caliber branch.  Circumflex Artery: Large caliber vessel. 2 obtuse marginal branches. No obstructive disease.  Right Coronary Artery: Large, dominant vessel. 40% tubular stenosis mid vessel. This does not appear to be flow limiting.  Left Ventricular Angiogram: Not performed.  Impression:  1. Mild non-obstructive CAD  2. Non-ischemic cardiomyopathy  Recommendations: Medical management. Continue ASA and beta blocker. Would start Ace-inhibitor and statin. Would monitor overnight tonight. OK to d/c home tomorrow if groin cath site stable.  Complications: None. The patient tolerated the procedure well.    Admission HPI:  Patient is a 56 y.o. female with a PMHx of epilepsy,  COPD s/p respiratory failure and two subsequent intubation (Oct 2012), as well as h/o tracheostomy, who was brought to ED by EMS after having an observed seizure this morning. She was unresponsive upon arriving at ED. She was found to have-right sided weakness and preferential gaze. Her friend reported to  the ED physician that patient had consumed a large amount of alcohol (6-pack) at a [employer] party last night. The tonic-clonic seizure lasted for ten minutes. EKG showed ST elevation in frontal leads. However, STEMI was assessed to be unlikely given her active neurologic symptoms and recent seizure in the setting of negative troponin. Brain MRI has showed possible small area of acute infarct in left medial thalamus. Per neurology consult, it was less likely to be a stroke. During our interview, patient was able to talk and her right sided weakness and preferential gaze seemed to have resolved. However, she appeared disengaged and responded "that's about all I know" to all questions that we had asked. She was also in respiratory distress using accessary muscles to breath. She also had another clonic seizure that lasted for 1.5 min. It resolved with 2mg  Ativan at bedside.   Admission Vital Signs:  T:  98.7  P:  120  BP:  127/80  RR:  22  O2 sat:  94% 2.5L   Admission Physical Exam:  General:  Thin white female, appears older than recorded age, disheveled appearance, in acute respiratory distress, using accessory respiratory muscles sitting on padded stretcher in tripod position breathing through slightly parted lips, inattentive during the exam, perseverates same answer "thats about all I know", fidgety and appears uncomfortable   Head:  Normocephalic, atraumatic.   Eyes:  PERRLA, EOMI, No signs of anemia or jaundice.   Nose:  Nares clear without discharge, bleeding or hypertrophy of turbinates   Throat:  Tongue intact with no sign of trauma. Slightly dry mucus membranes, Oropharynx nonerythematous, no exudate appreciated.   Neck:  Supple, No carotid Bruits, no JVD.prior trach placement apparent   Lungs:  Labored breathing, distant breath sounds, clear to auscultation with prolonged expiratory phase and few wheezes over right base   Heart:  RRR. S1 and S2 normal without gallop, murmur, or rubs  appreciated   Abdomen:  BS normoactive. Soft, Nondistended, non-tender. No masses or organomegaly.appreciated   Extremities:  No pretibial edema, distal pulses intact bilaterally   Neurologic:  Not oriented to self, time, and place. Motor strength is 3/5 in all extremities, Sensations intact to light touch. 2+ reflex UE and LE bilaterally. Negative Babinski sign.   Skin:  Flushing in face and chest. No visible rashes or erythema elsewhere.      Hospital Course by problem list: 1. Epilepsy s/p tonic-clonic seizure: Ms. Ingram presented in post-ictal state to ED and was perseverating during initial exam. Patient has a substantial history of neurologic diseases such as epilepsy, meningitis, subependymal abscess status post ventricular shunt, and craniotomy. Her Dilantin level was therapeutic but given reported history of recent alcohol consumption the night prior her seizure threshold was likely lowered resulting in epileptic event.  Although she has a history of cocaine abuse her urine drug screen was negative for cocaine. CT showed no acute changes and MRI demonstrated possible small area of acute infarct of left medial thalamus. She experienced witnessed seizure on exam in the ED and was given Ativan 2 mg with resolution of the seizure. Keppra was added to her Dilantin regimen. Neurology was consulted with recommendations to continue monitor closely. She counseled  against consuming alcohol and tobacco. Given that Ms. Grandmaison is likely to continue consuming alcohol which is thought to be the precipitant of this epileptic event, she was continued on Keppra at discharge for a home regimen to include both Dilantin and Keppra. She will be reassessed at a follow up appointment with Dr. Andrey Campanile at [facility] has been made for 06/13/11.   2. COPD with Respiratory Distress, hypoxic: On admission Ms. Mcdermid exhibited  labored breathing on exam with prolonged expiratory phase with adequate oxygenation >90% on 2L nasal  canula. ABG showed respiratory acidosis with metabolic compensation.  Her history is significant for COPD with respiratory failure requiring intubation and prior tracheotomy thus threshold for intubation was low in this case and Critical Care was consulted.   Due to high likelihood for aspiration pneumonia based on recent seizure history, she was started on Zosyn on 06/04/11. Subsequent PA/Lateral CXR showed improving infiltrates favoring interstitial edema thus, zosyn was discontinued one day after initiation. Patient states that at baseline, she does have labored breathing frequently.  She does not use home oxygen.  By time of discharge she was able to be titrated to room air however, she continued to breath using accessory muscles. She will need better long term control of her COPD as an outpatient and was discharged on daily Spiriva and Advair as well as Albuterol prn. Follow up appointment with Dr. Marchelle Gearing at [facility] has been scheduled for 06/24/11 at 9am.  3. TIA: new stroke is not definite on imagining at admission. Neurology had assessed patient. She was not a tPA candidate given on-going seizure activity. Patient has probably had a TIA, instead of a CVA due to the resolution of her right-sided weakness and preferential gaze. She was discharged in improved condition without residual neurological deficits. Patient will continue to take ASA 81mg  to reduce risk factors for a future event.   4. STEMI, anterior and ischemic cardiomyopathy: Patient presented to the ED with ST elevation in V3~V5. Troponin level trended down, 4.12--> 2.45--> 3.97--> 3.17--> 1.29 by time of discharge. Patient's last echo was done in Oct 2012, which showed EF 65% with no wall motion abnormalities. Her EKG on 2/6 showed resolving ST-elevation, but T wave inversion and QTc of 523. Repeat echo showed EF 30~35% and akinesis in mid-distalanteroseptal, anterior, anterior lateral, inferiour, and apical myocardium, consistent with  infarction in the distribution of the LAD. Cardiac cath done on 06/06/11 has showed mild non-obstructive CAD (30% mid vessel stenosis in LAD and 40% mid vessel stenosis in RCA).  It also demonstarted non-ischemic cardiomyopathy, which could be caused by alcohol and drug abuse as well as possible hypotension during patient's 10-min seizure. On discharge, patient's EKG showed T wave inversion in anterior lateral lead and ST elevation had mostly resolved. Patient will be managed on lisinopril 10mg  daily with goal of 40mg  and Simvastatin 20mg  daily.   5. Polysubstance abuse with acute Delirium and alcohol withdrawal: Ms. Hofstra continues to smoke cigarettes and drink alcohol in spite of prior counseling on the detriment to her health. Given her recent seizure, benzodiazepine therapy was deemed important and she was on [facility] With[facility]al [facility]  as well as a nicotine patch. Although her highest CIWA scores were at 7 or 8, she remained very fidgety through her admission and became agitated requiring a 1:1 sitter and Ativan dosage modification of CIWA protocol ie. escalation in dosage of Ativan used at lower scores. Social worker was consulted regarding ETOH abuse and smoking cessation resources available to Ms.  Gielow, including Alcoholics anonymous and 1-800-QUIT-NOW.    6. Time spent on discharge coordination greater than 40 minutes.   Discharge Vitals:  BP 105/67  Pulse 96  Temp(Src) 97.9 F (36.6 C) (Oral)  Resp 18  Ht 5\' 7"  (1.702 m)  Wt 54.3 kg (119 lb 11.4 oz)  BMI 18.75 kg/m2  SpO2 94%  Discharge Labs:  Basic Metabolic Panel:  Lab 06/05/11 1610 06/04/11 0127 06/03/11 2004  NA 140 139 --  K 3.5 4.0 --  CL 107 105 --  CO2 24 25 --  GLUCOSE 139* 129* --  BUN 14 17 --  CREATININE 0.38* 0.46* --  CALCIUM 8.5 8.3* --  MG -- -- 1.9  PHOS -- -- --   Liver Function Tests:  Lab 06/04/11 1000  AST --  ALT --  ALKPHOS --  BILITOT --  PROT --  ALBUMIN 2.8*    CBC:  Lab  06/05/11 0930 06/04/11 0127  WBC 6.3 15.1*  NEUTROABS -- --  HGB 12.0 13.3  HCT 36.4 40.3  MCV 102.8* 101.8*  PLT 107* 138*   Cardiac Enzymes:  Lab 06/05/11 0930 06/04/11 1015 06/04/11 0126 06/03/11 2003  CKTOTAL -- 259* 289* 292*  CKMB -- 14.3* 16.6* 18.7*  CKMBINDEX -- -- -- --  TROPONINI 1.29* 3.17* 3.97* --    Lab 06/03/11 2004  VITAMINB12 992*  FOLATE --  FERRITIN --  TIBC --  IRON --  RETICCTPCT --   Urine Drug Screen: Drugs of Abuse     Component Value Date/Time   LABOPIA NONE DETECTED 06/03/2011 1204   LABOPIA NEGATIVE 02/08/2011 0732   COCAINSCRNUR NONE DETECTED 06/03/2011 1204   COCAINSCRNUR NEGATIVE 02/08/2011 0732   LABBENZ POSITIVE* 06/03/2011 1204   LABBENZ POSITIVE* 02/08/2011 0732   AMPHETMU NONE DETECTED 06/03/2011 1204   AMPHETMU NEGATIVE 02/08/2011 0732   THCU NONE DETECTED 06/03/2011 1204   LABBARB NONE DETECTED 06/03/2011 1204    Alcohol Level: No results found for this basename: ETH:2 in the last 168 hours Urinalysis:  Lab 06/04/11 0122  COLORURINE AMBER*  LABSPEC 1.029  PHURINE 5.5  GLUCOSEU NEGATIVE  HGBUR NEGATIVE  BILIRUBINUR SMALL*  KETONESUR 15*  PROTEINUR 30*  UROBILINOGEN 1.0  NITRITE NEGATIVE  LEUKOCYTESUR NEGATIVE    Signed: Kristie Cowman, MD PGY1 06/07/2011, 1:18 PM

## 2011-06-10 DIAGNOSIS — J441 Chronic obstructive pulmonary disease with (acute) exacerbation: Secondary | ICD-10-CM | POA: Diagnosis present

## 2011-06-10 DIAGNOSIS — J449 Chronic obstructive pulmonary disease, unspecified: Secondary | ICD-10-CM | POA: Diagnosis present

## 2011-06-10 DIAGNOSIS — F10231 Alcohol dependence with withdrawal delirium: Secondary | ICD-10-CM

## 2011-06-10 LAB — CULTURE, BLOOD (ROUTINE X 2)
Culture  Setup Time: 201302050440
Culture  Setup Time: 201302050441
Culture  Setup Time: 201302050818
Culture: NO GROWTH
Culture: NO GROWTH
Culture: NO GROWTH

## 2011-06-10 NOTE — Progress Notes (Signed)
Resident Addendum to Medical Student Note   I have seen and examined the patient, and agree with the the medical student assessment and plan outlined above. Please see my brief note below for additional details.  OBJECTIVE: VS: Reviewed  Meds: Reviewed  Labs: Reviewed  Imaging: Reviewed   Physical Exam: General: Vital signs reviewed and noted. Sitting up in bed cross legged, in no acute distress; alert, appropriate and cooperative throughout examination, still fidgety  HEENT: Normocephalic, atraumatic  Lungs: Normal respiratory effort. Clear to auscultation BL anterior fields without crackles or wheezes with prolonged expiration  Heart: RRR. S1 and S2 normal without gallop, murmur, or rubs.  Abdomen: BS normoactive. Soft, Nondistended, non-tender. No masses or organomegaly.  Extremities: No pretibial edema.    ASSESSMENT/ PLAN: Pt is a 56 y.o. yo female admitted with seizures in setting of likely TIA and anterior STEMI.  1. Epilepsy s/p seizure: currently controlled on Dilantin and Keppra, Neurology signed off, risk stratified with addition of Aspirin and simvastatin given TIA, UCX and BCX wgtd x4day  PLAN  -cont telemetry  -cont Dilantin  -cont Keppra given that she will likley resume ETOH consumption thus lowering seizure threshold on Dilantin alone  -cont CIWA protocol with modification to begin Ativan at score of 8 with dosage progression  -cont Nicotine patch 21 mg daily   2. Respiratory Distress: in setting of COPD, resolved, currently oxygen sat stable at 100 on room air  PLAN  -cont Spiriva and advair PRN  -f/u CXR in 6 weeks as outpt   3. TIA?: symptoms resolved within a few hours  PLAN  -continue close monitoring  -cont aspirin therapy 81mg  qd   4. 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 Polysubstance abuse: fidgety and anxious for the most part  PLAN  -reports being on "Disability" as sole source of support  -cont Nicotine patch  -cont CIWA with escalation of Ativan   6. Disposition: improved and stable for discharge PLAN -f/u Dr. Andrey Campanile at [facility] 06/13/11 at 2:15 -f/u Dr. Marchelle Gearing (Pulm) 06/24/11  Length of Stay: 4   Kristie Cowman, MD PGYI, Internal Medicine Resident 06/10/2011, 2:11 PM

## 2011-06-24 ENCOUNTER — Institutional Professional Consult (permissible substitution): Payer: Medicaid Other | Admitting: Internal Medicine

## 2011-07-16 ENCOUNTER — Institutional Professional Consult (permissible substitution): Payer: Medicaid Other | Admitting: Internal Medicine

## 2011-09-02 ENCOUNTER — Other Ambulatory Visit: Payer: Self-pay | Admitting: Ophthalmology

## 2011-09-10 IMAGING — CT CT HEAD W/O CM
1 of 2 series · 13 of 30 positions shown, 17 images · non-contrast
Comparison: 09/28/2007

CLINICAL DATA: Seizure

CT HEAD WITHOUT CONTRAST
TECHNIQUE: Contiguous axial images were obtained from the base of
the skull through the vertex without contrast.

[Series 2: brain · axial · 0.47mm/px · z∈[+138,+272]mm · 13 of 32 slices shown, 17 images]
[im 3/32  brain]
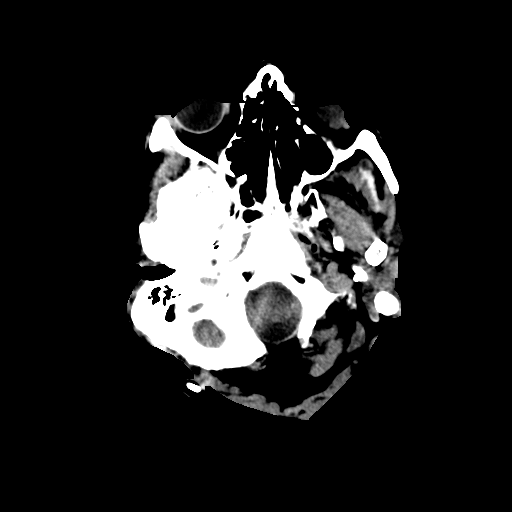
[im 3/32  bone]
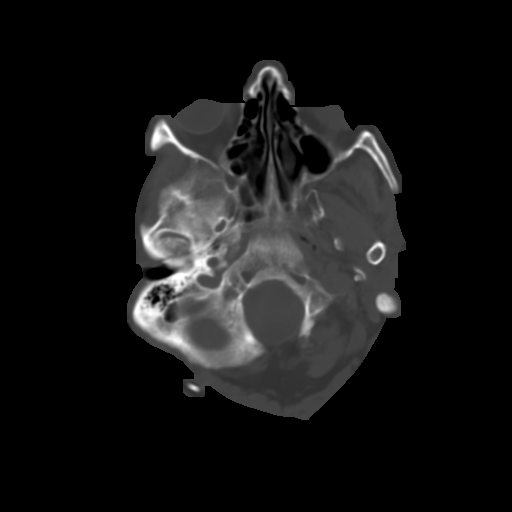
[im 5/32  brain]
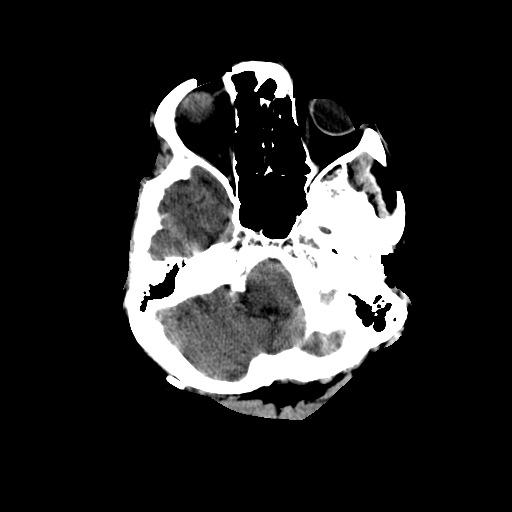
[im 7/32  brain]
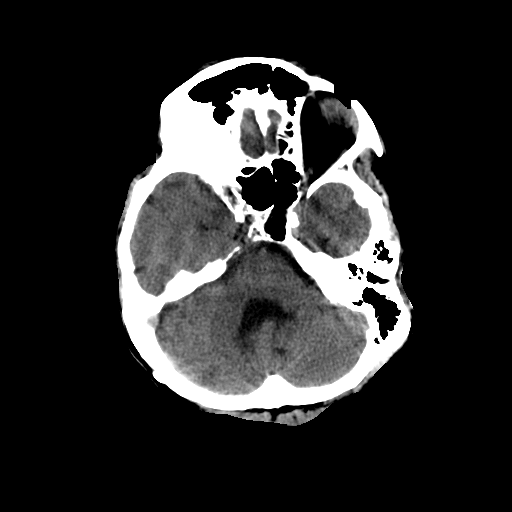
[im 9/32  brain]
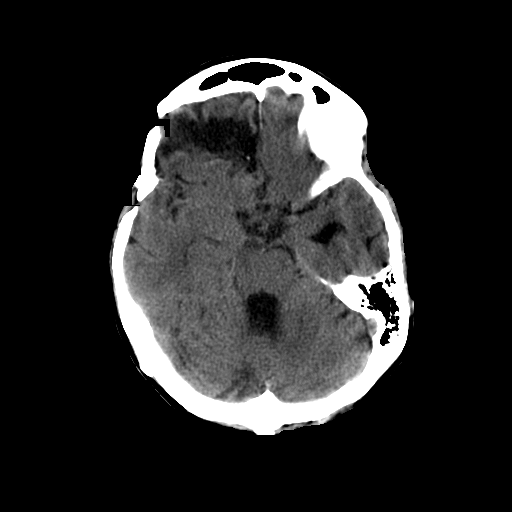
[im 12/32  brain]
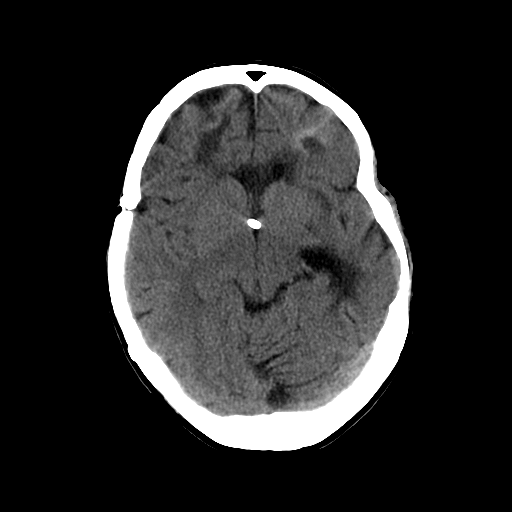
[im 12/32  bone]
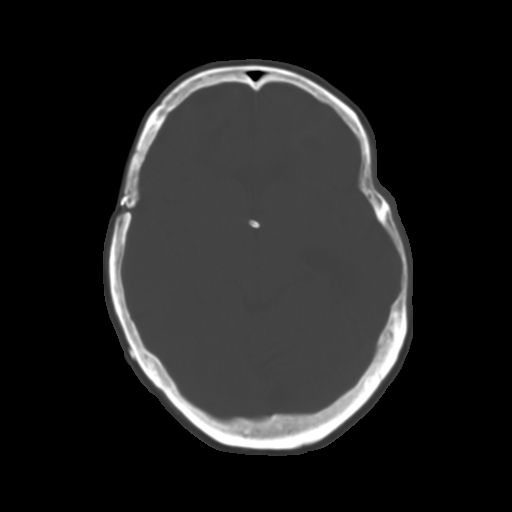
[im 14/32  brain]
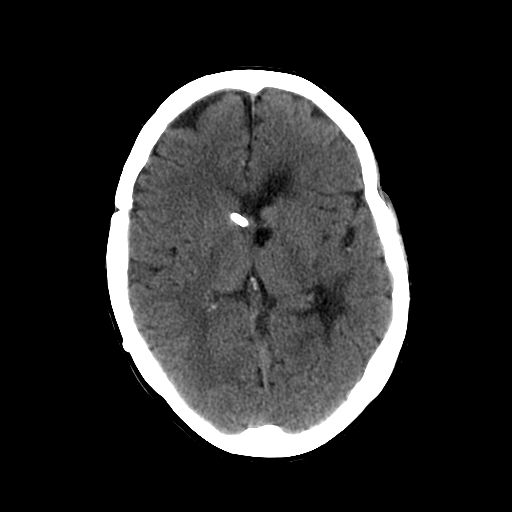
[im 16/32  brain]
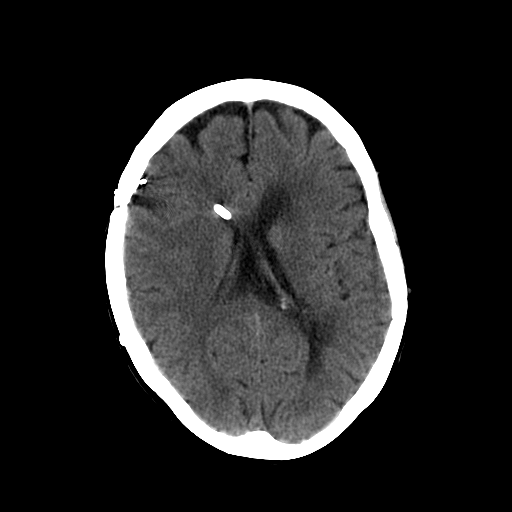
[im 18/32  brain]
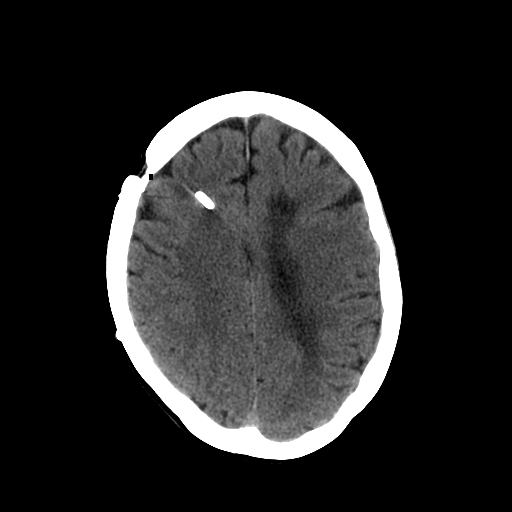
[im 20/32  brain]
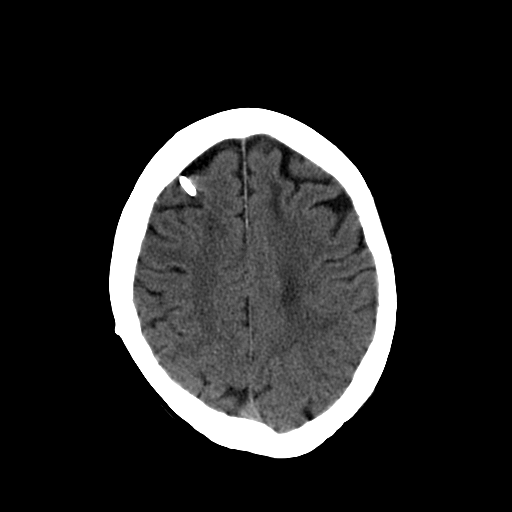
[im 20/32  bone]
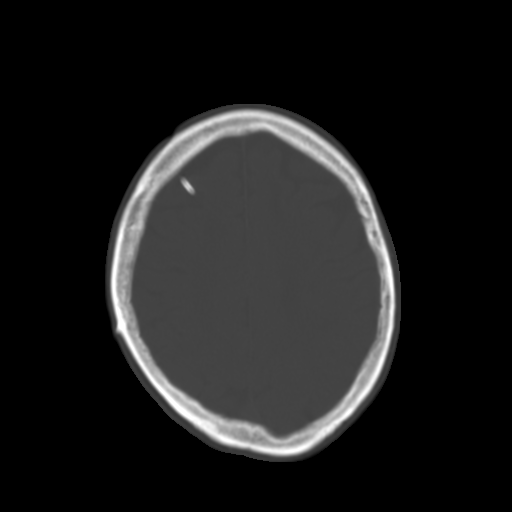
[im 23/32  brain]
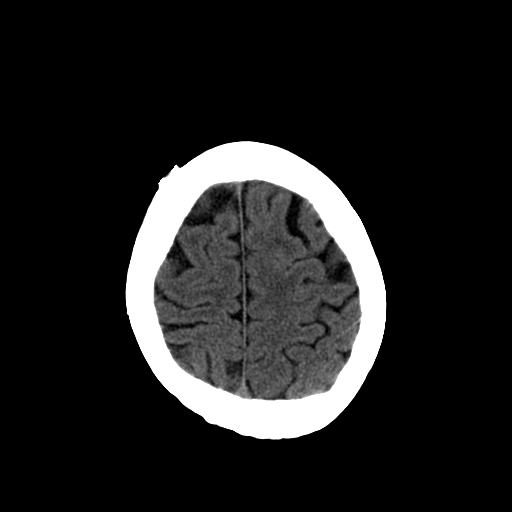
[im 25/32  brain]
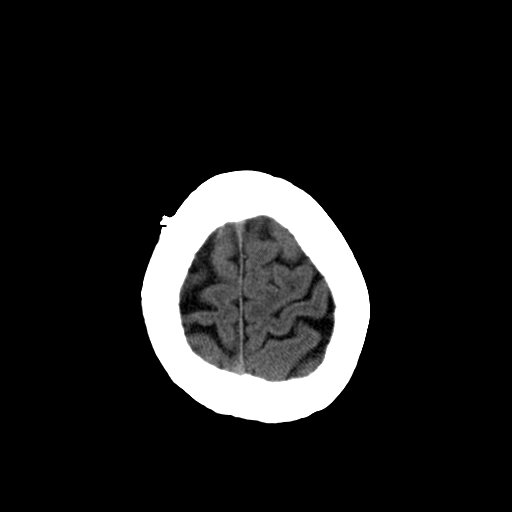
[im 27/32  brain]
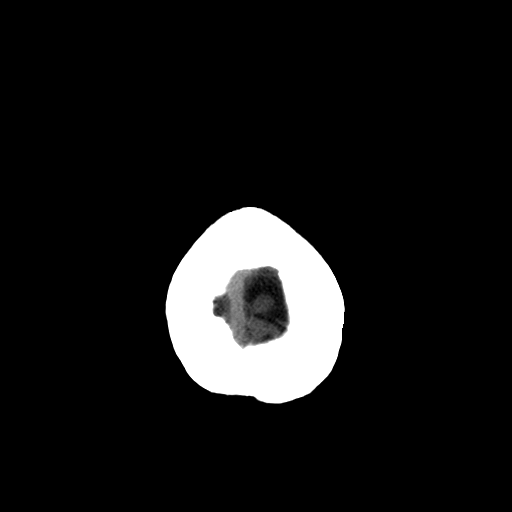
[im 29/32  brain]
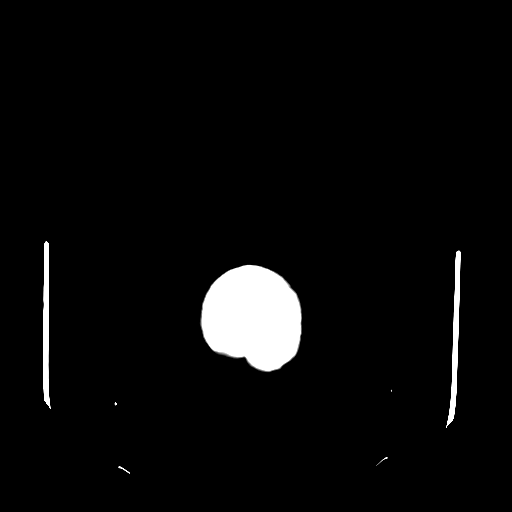
[im 29/32  bone]
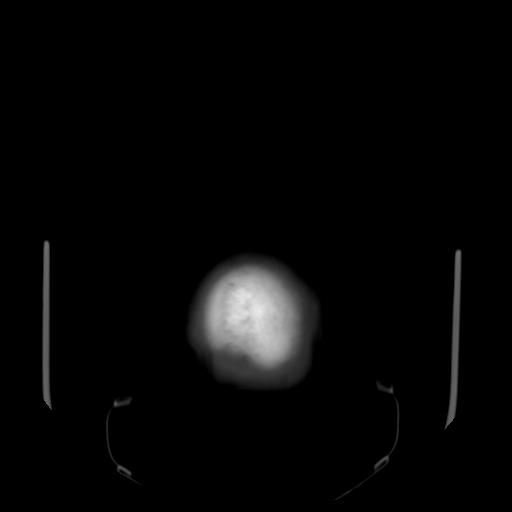

[13 of 30 positions shown; findings below may reference images not displayed]

FINDINGS: Evidence of previous placement of right frontal approach
ventricular shunt, with punctate high density material, possibly
calcification, in the bilateral frontal lobes with encephalomalacia
and evidence of prior craniotomy.  No change in ventricular size.
No new midline shift. No acute hemorrhage, acute infarction, or
mass lesion is seen. No new acute osseous abnormality.  Orbits and
paranasal sinuses are intact.
IMPRESSION: Stable findings as above.  No acute intracranial finding.

## 2011-11-03 IMAGING — CR DG CHEST 2V
2 series · 2 of 2 positions shown · non-contrast
Comparison: 12/15/2010

CLINICAL DATA: Cough, shortness of breath

CHEST - 2 VIEW

[w chest pa]
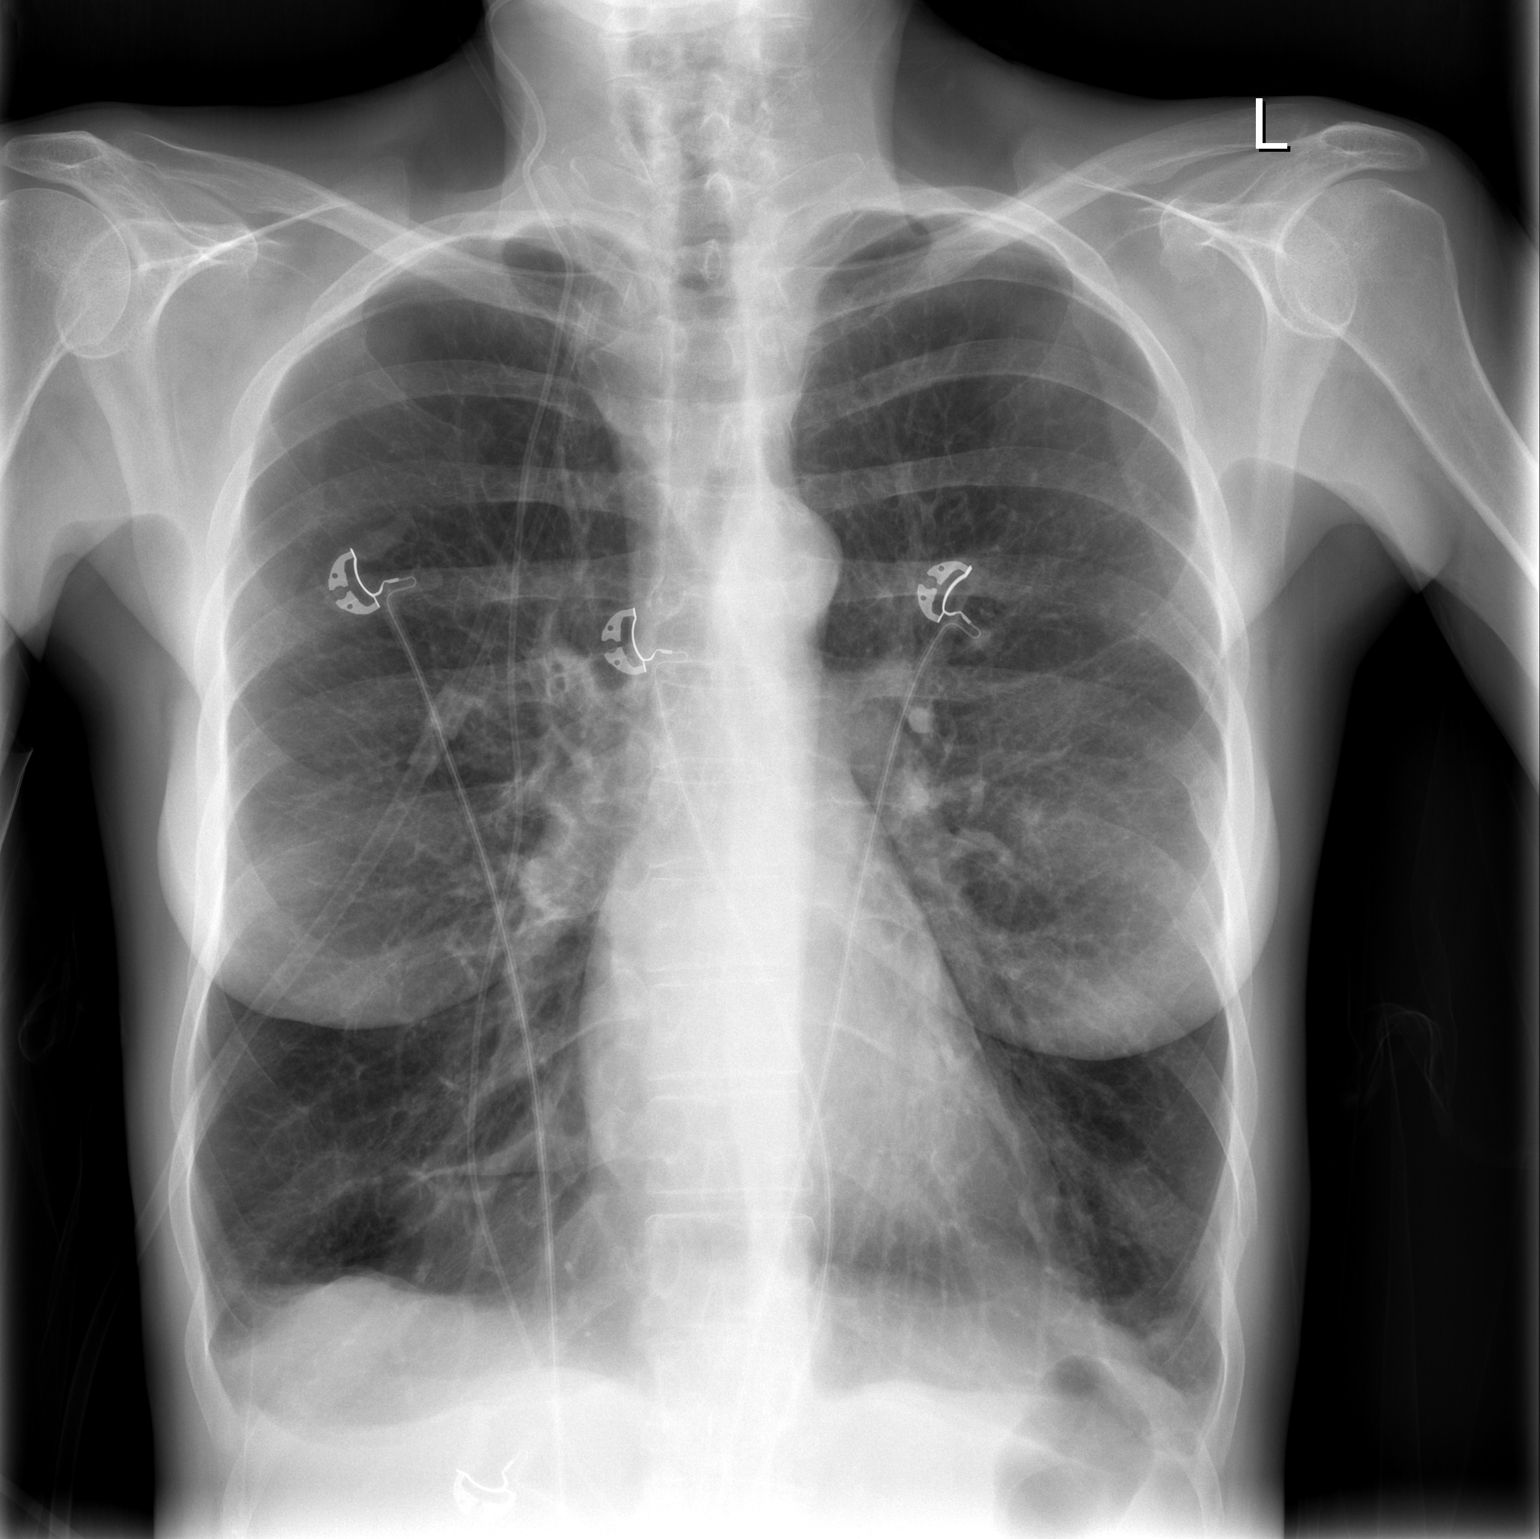

[w chest lat]
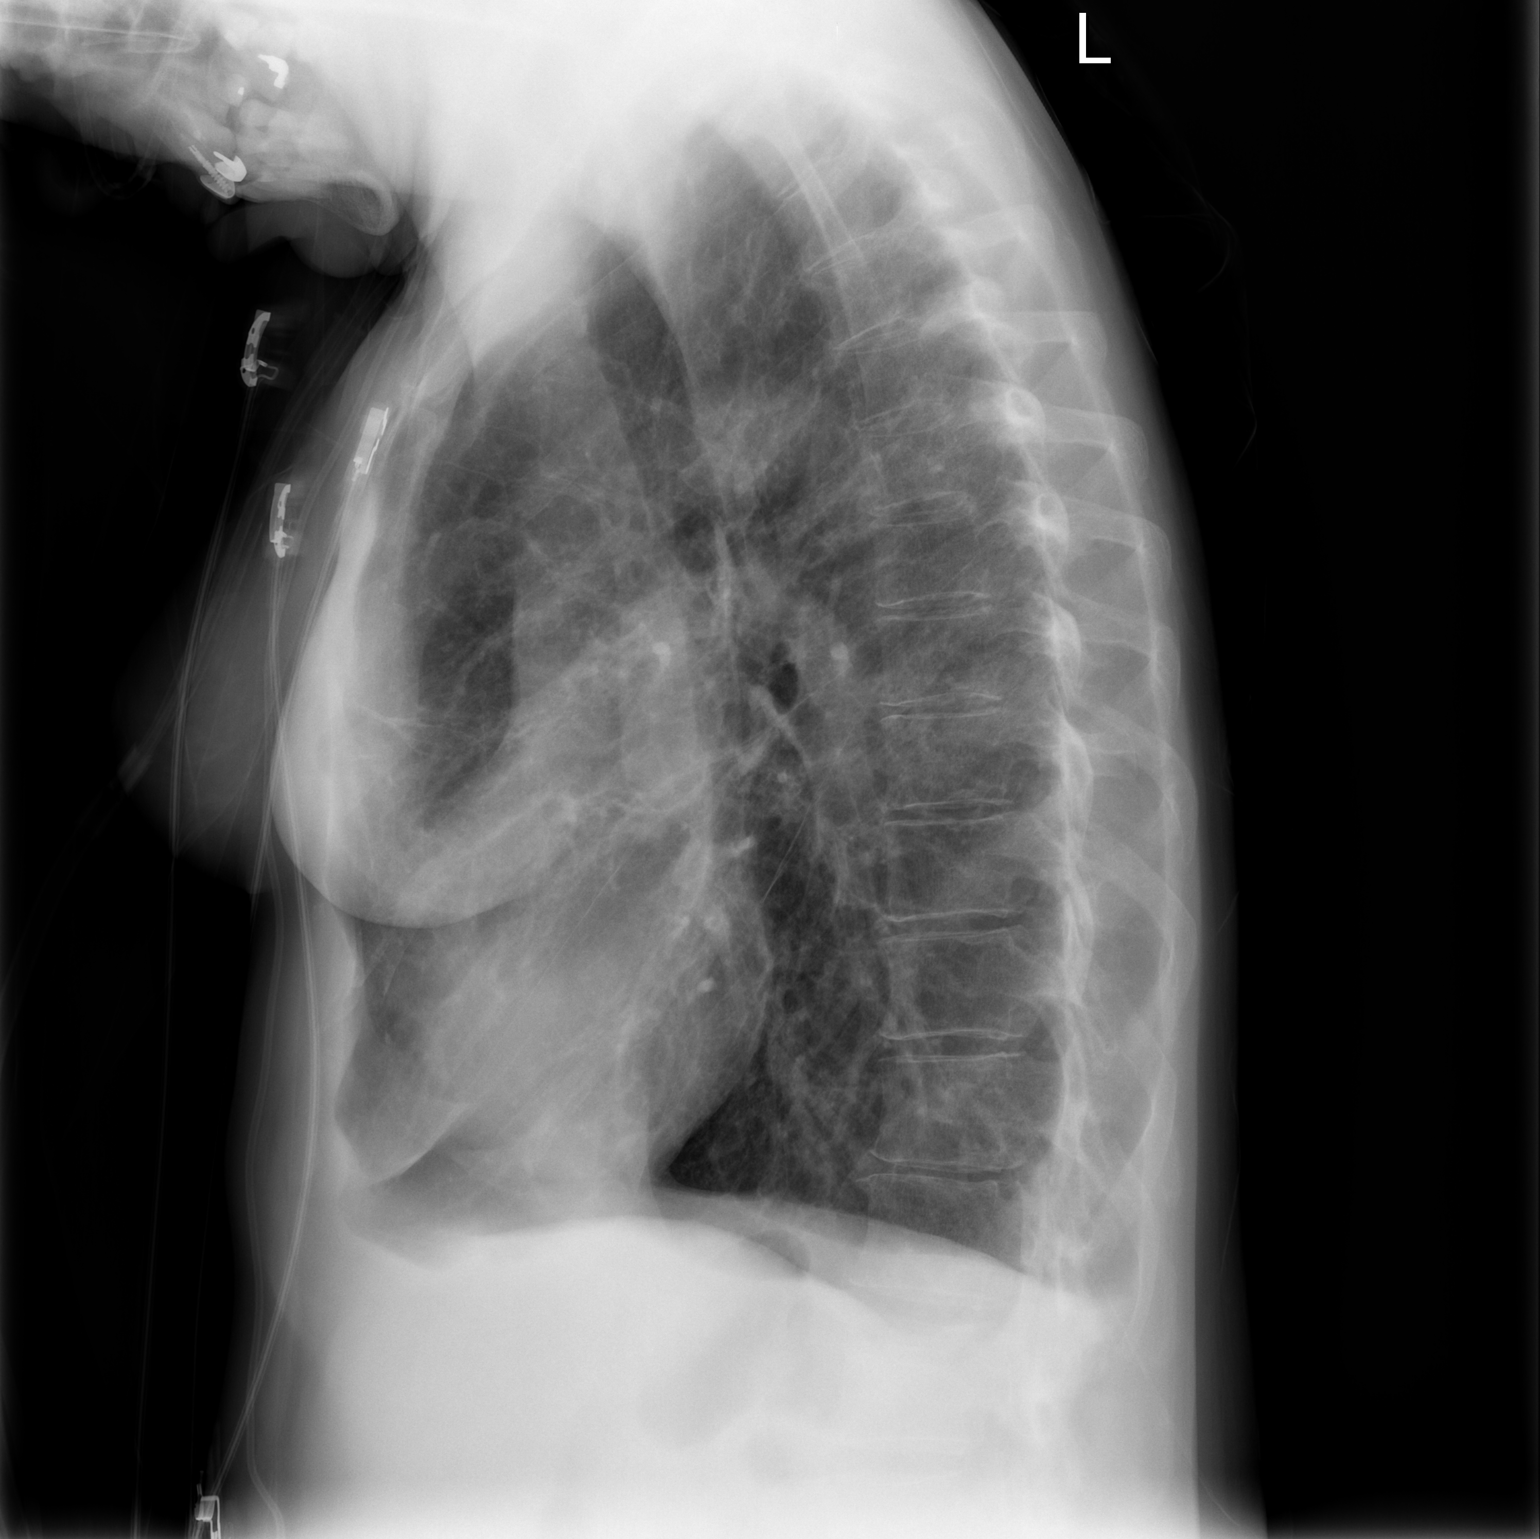

[2 of 2 positions shown; findings below may reference images not displayed]

FINDINGS: Visualized portions of the VP shunt tubing appear intact.
Lungs are hyperinflated with coarse attenuated interstitial
markings.  No confluent airspace infiltrate.  No effusion.  Heart
size normal.  Regional bones unremarkable.
IMPRESSION: 1.  Hyperinflation without acute or superimposed abnormality.

## 2011-11-04 IMAGING — CR DG CHEST 1V PORT
1 series · 1 of 1 positions shown · non-contrast
Comparison: Two-view chest x-ray 02/07/2011 and 03/16/2007.

CLINICAL DATA: Shortness of breath.  Intubation.

PORTABLE CHEST - 1 VIEW 02/08/2011:

[AP]
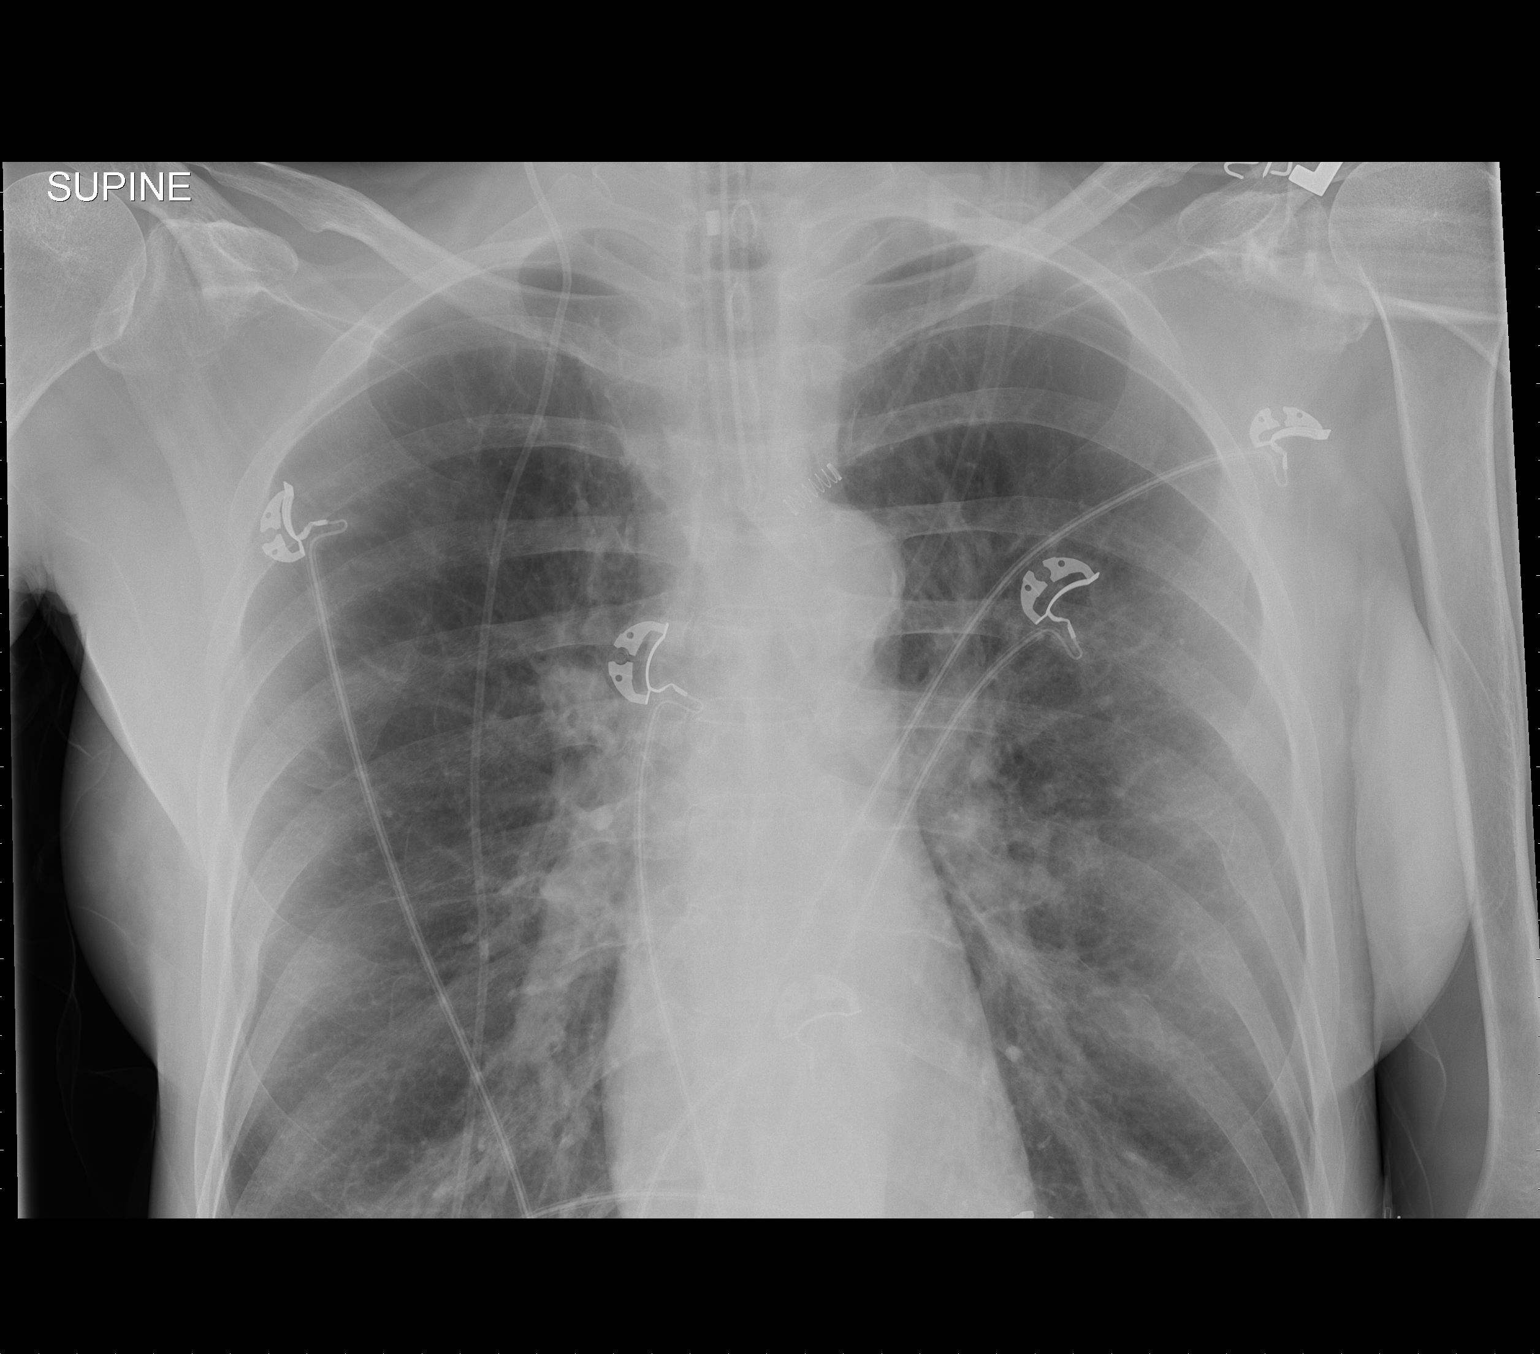

[1 of 1 positions shown; findings below may reference images not displayed]

FINDINGS: Endotracheal tube tip in satisfactory position
approximately 5 cm above the carina.  Right-sided
ventriculoperitoneal shunt catheter tubing again noted.  Cardiac
silhouette normal in size for the AP portable technique.  Lungs
hyperinflated with emphysematous changes diffusely.  No localized
airspace consolidation.
IMPRESSION: 1.  Endotracheal tube tip in satisfactory position approximately 5
cm above the carina.
2.  COPD/emphysema.  No acute cardiopulmonary disease.

## 2011-11-04 IMAGING — CR DG CHEST 1V PORT
1 series · 1 of 1 positions shown · non-contrast
Comparison: Earlier film of the same day

CLINICAL DATA: Acute respiratory failure

PORTABLE CHEST - 1 VIEW

[AP]
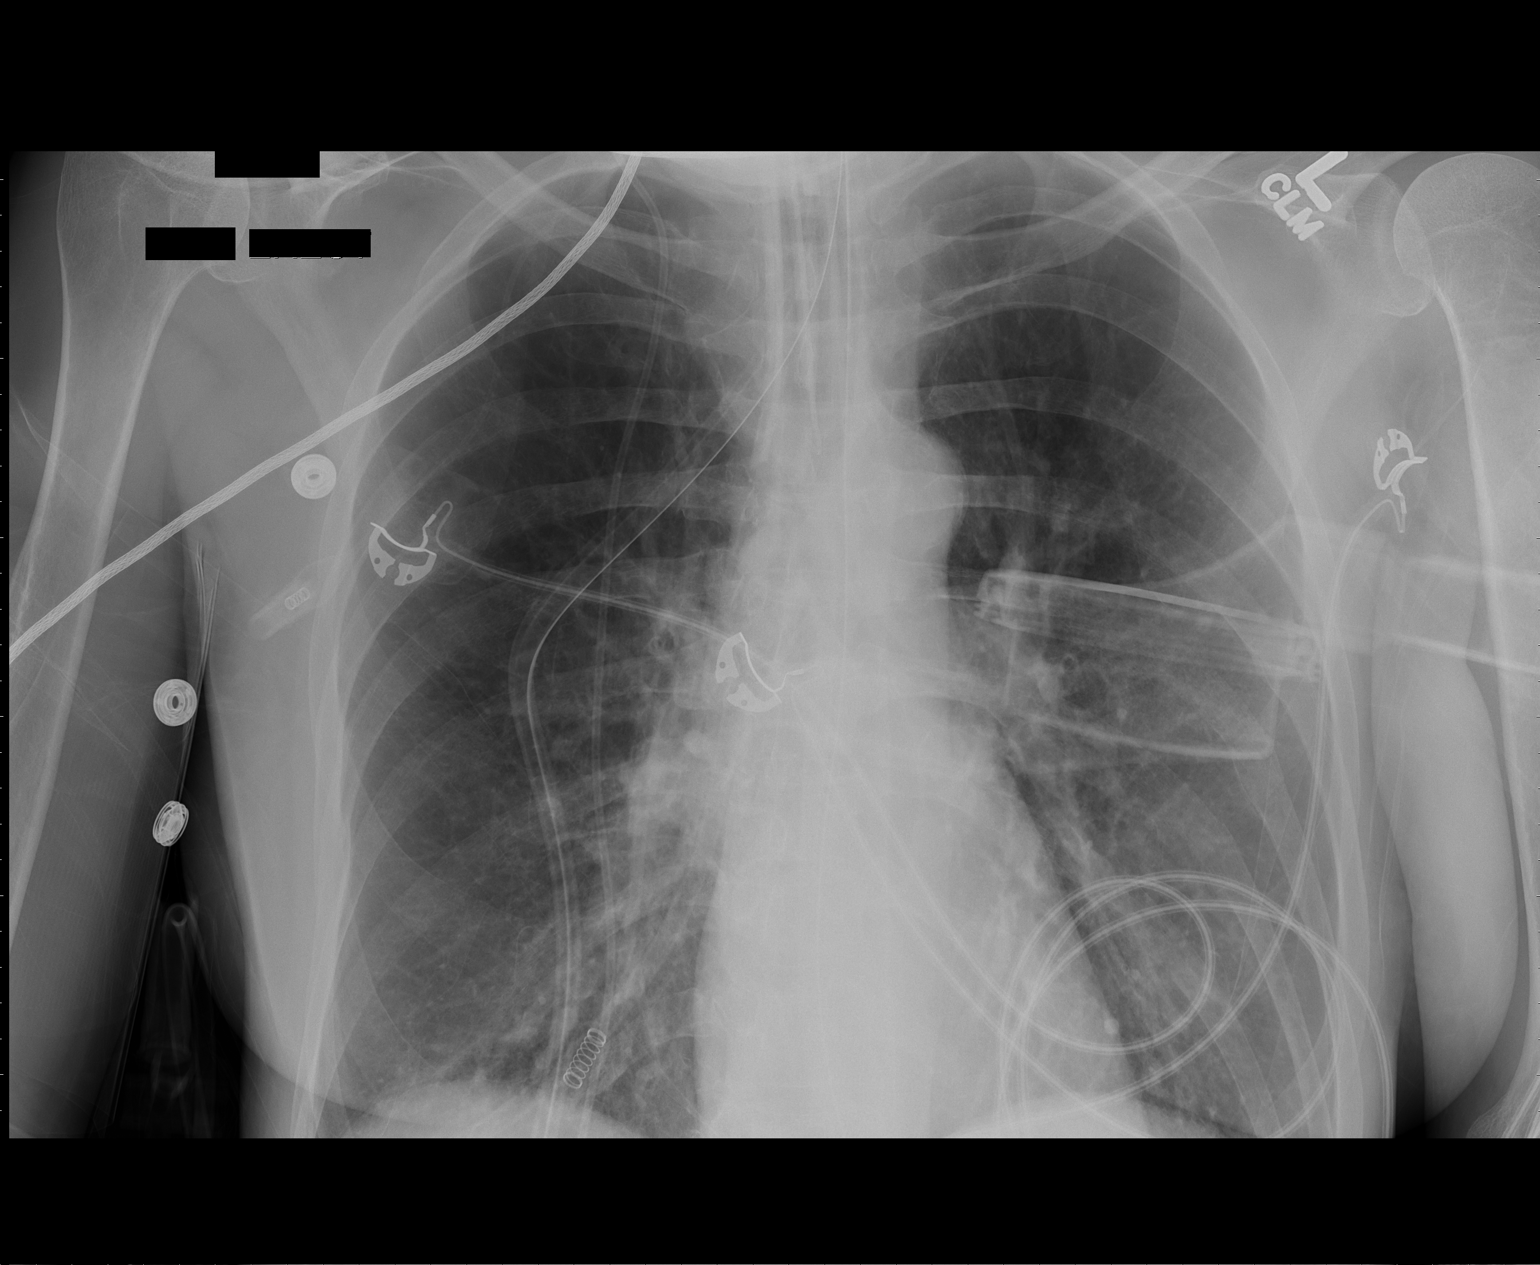

[1 of 1 positions shown; findings below may reference images not displayed]

FINDINGS: Endotracheal tube, nasogastric tube, and visualized VP
shunt tubing stable.  Lungs are hyperinflated with somewhat
attenuated peripheral bronchovascular markings, prominent perihilar
and bibasilar markings.  No confluent airspace infiltrate.  No
effusion.  Heart size normal.
IMPRESSION: 1.  Stable hyperinflation and chronic changes.
2. Support hardware stable in position.

## 2011-11-05 IMAGING — CR DG CHEST 1V PORT
1 series · 1 of 1 positions shown · non-contrast
Comparison: 02/08/2011

CLINICAL DATA: Ventilator dependence.  COPD.

PORTABLE CHEST - 1 VIEW

[view not recorded]
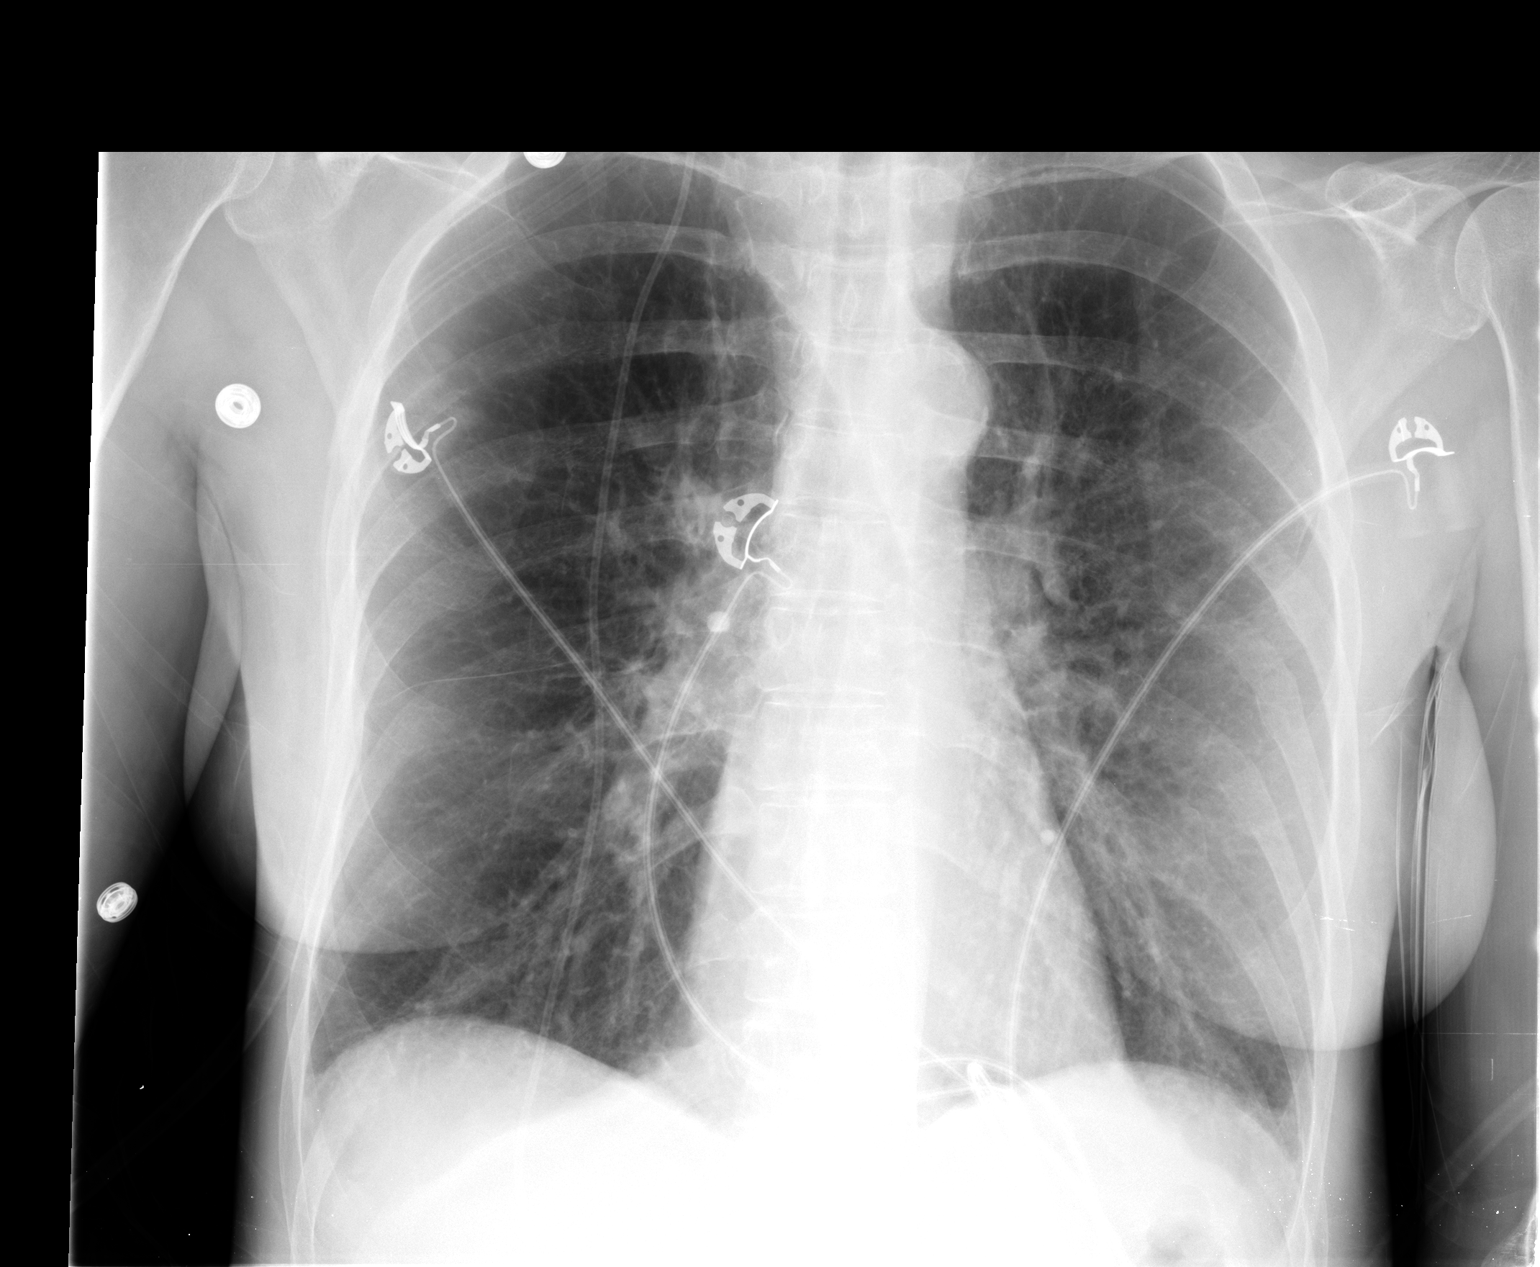

[1 of 1 positions shown; findings below may reference images not displayed]

FINDINGS: 9399 hours. Hyperexpansion is consistent with emphysema.
Interstitial markings are diffusely coarsened with chronic
features. The lungs are clear without focal infiltrate, edema,
pneumothorax or pleural effusion. The cardiopericardial silhouette
is within normal limits for size.  Endotracheal tube and NG tube
have been removed in the interval.  A catheter superimposed on the
right hemithorax is probably a VP shunt.
IMPRESSION: Interval extubation and NG tube removal.

Emphysema without acute findings.

## 2011-11-06 IMAGING — CR DG CHEST 1V PORT
1 series · 1 of 1 positions shown · non-contrast
Comparison: Multiple recent previous exams.

CLINICAL DATA: COPD exacerbation.

PORTABLE CHEST - 1 VIEW

[view not recorded]
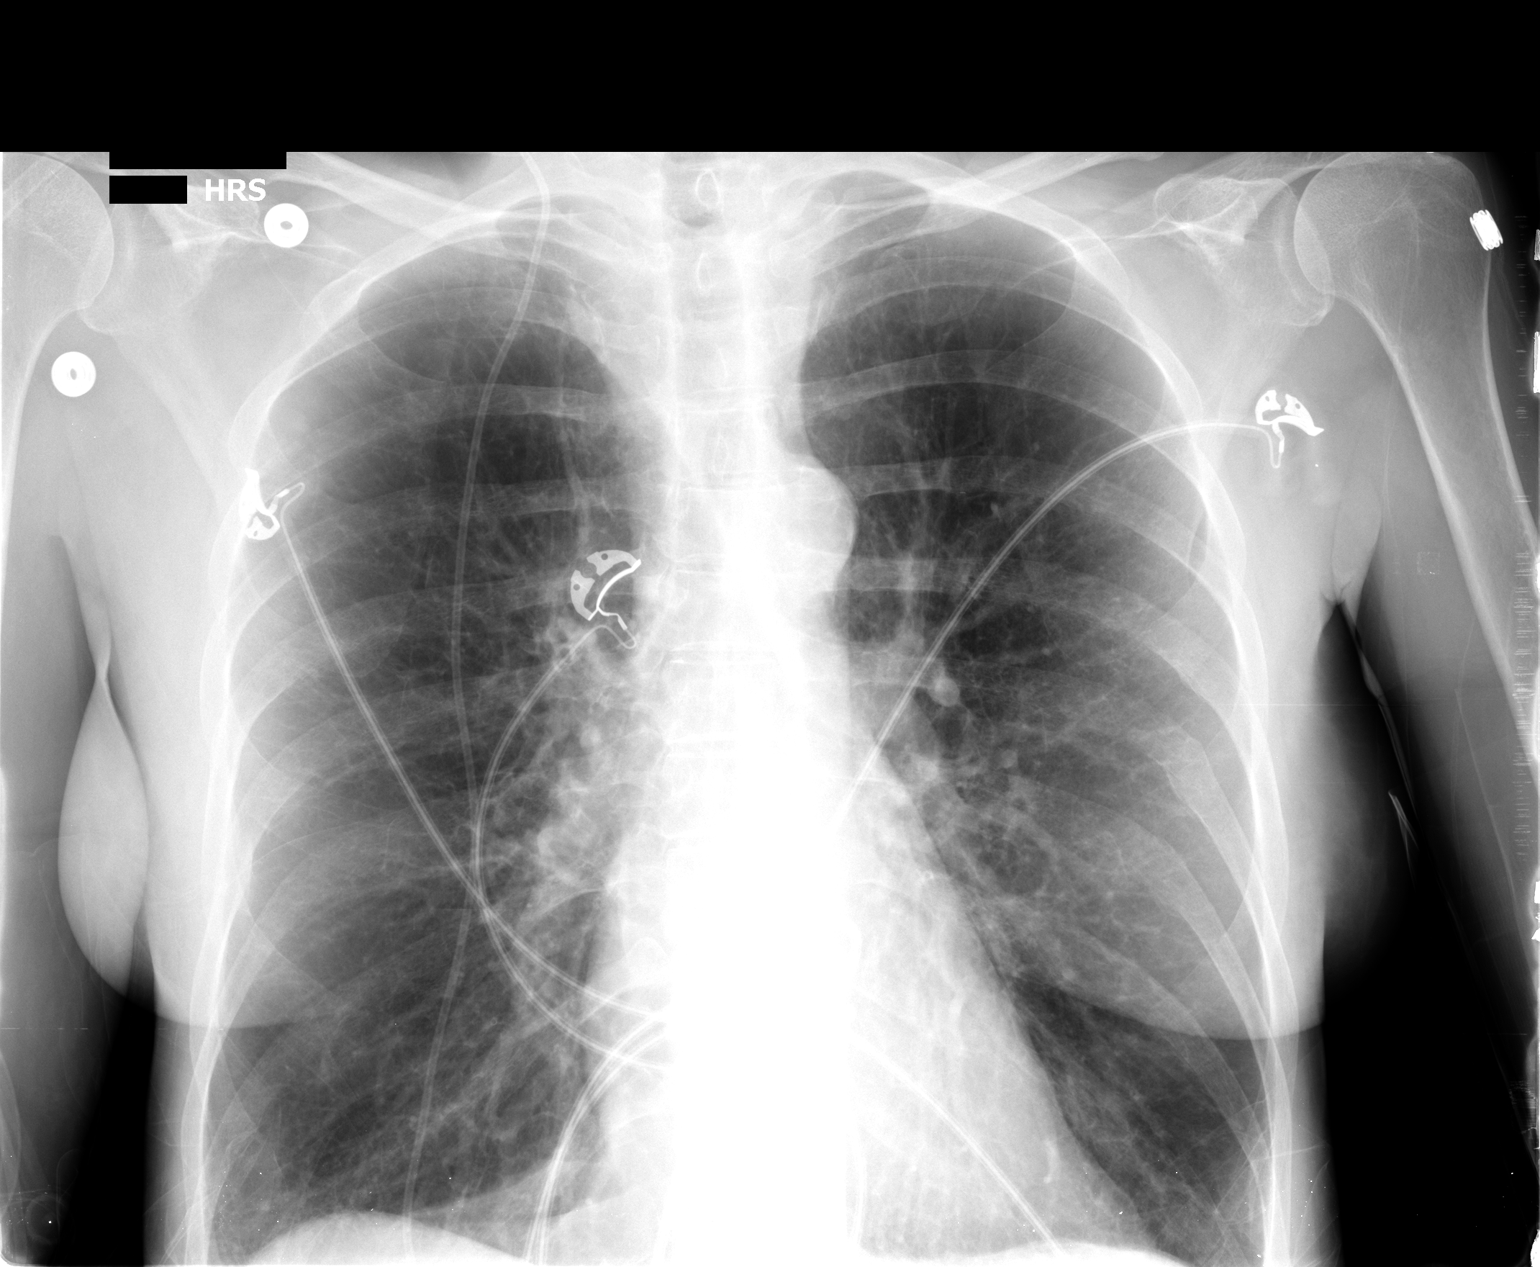

[1 of 1 positions shown; findings below may reference images not displayed]

FINDINGS: 6176 hours. Hyperexpansion is consistent with emphysema.
The lungs are clear without focal infiltrate, edema, pneumothorax
or pleural effusion. The cardiopericardial silhouette is within
normal limits for size. Telemetry leads overlie the chest.
IMPRESSION: Emphysema without acute cardiopulmonary findings.

## 2011-11-08 IMAGING — CR DG CHEST 1V PORT
1 series · 1 of 1 positions shown · non-contrast
Comparison: None.

CLINICAL DATA: Shortness of breath.

PORTABLE CHEST - 1 VIEW

[AP]
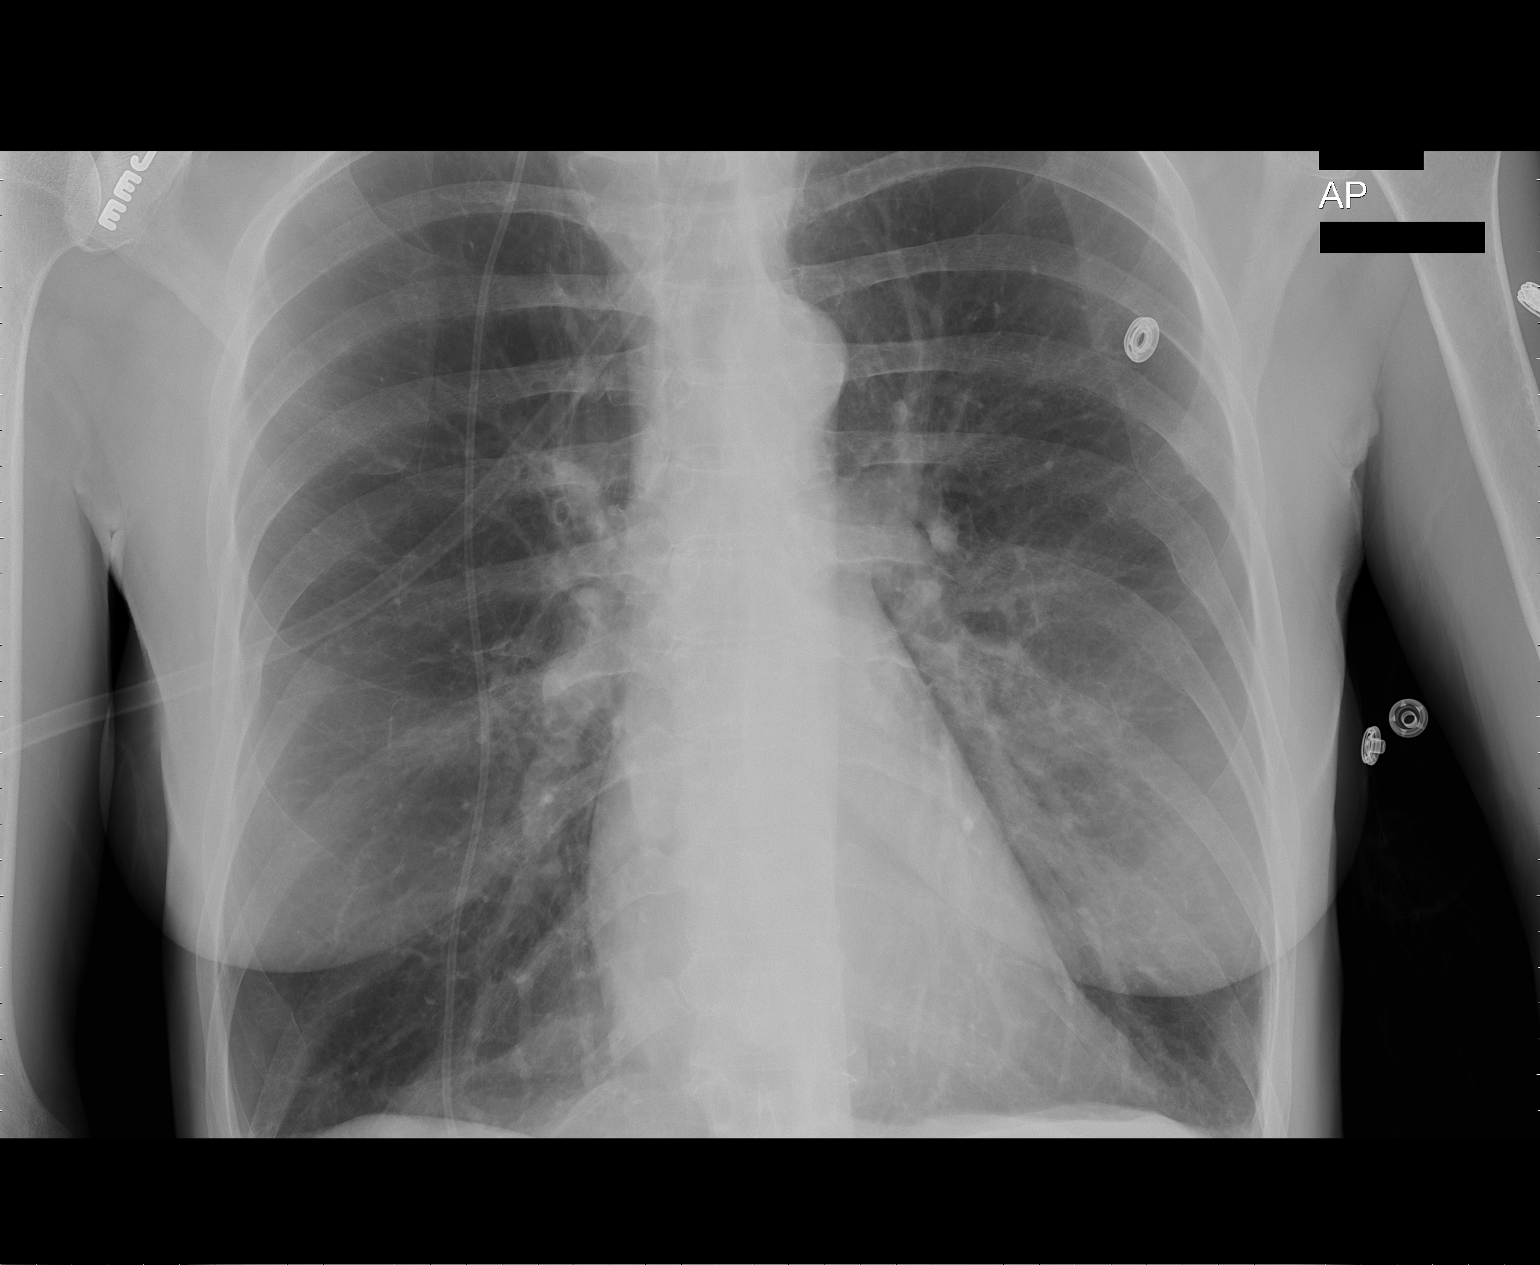

[1 of 1 positions shown; findings below may reference images not displayed]

FINDINGS: Chest is hyperexpanded but the lungs are clear.  Heart
size is normal.  Remote left rib fractures noted.
IMPRESSION: COPD without acute disease.

## 2011-11-09 IMAGING — CR DG CHEST 1V PORT
1 series · 1 of 1 positions shown · non-contrast
Comparison: Portable chest x-ray of 02/12/2011

CLINICAL DATA: COPD, follow-up

PORTABLE CHEST - 1 VIEW

[AP]
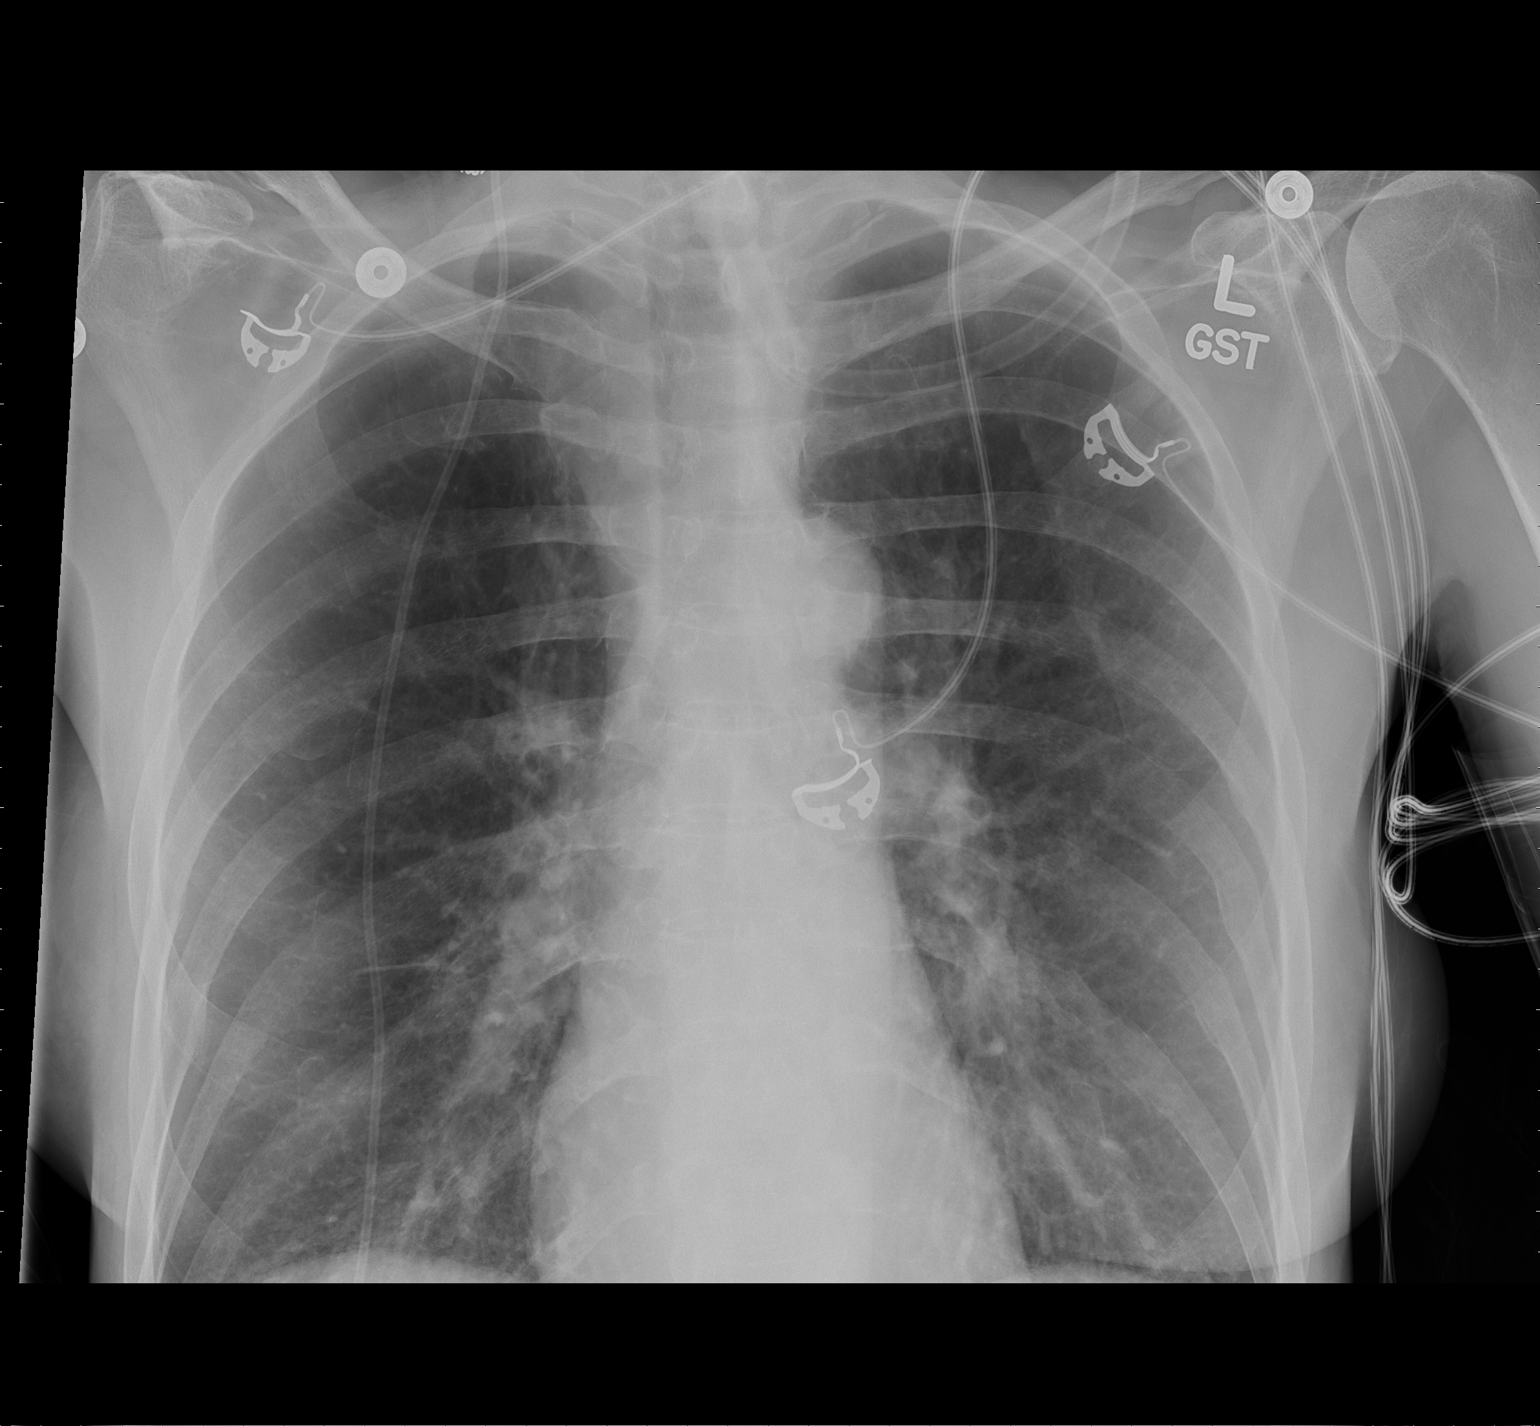

[1 of 1 positions shown; findings below may reference images not displayed]

FINDINGS: The lungs are clear but hyperaerated.  Mediastinal
contours are stable.  The heart is within normal limits in size.
No bony abnormality is seen.
IMPRESSION: Hyperaeration consistent with COPD.  No active lung disease.

## 2011-11-10 IMAGING — CT CT HEAD W/O CM
2 series · 16 of 30 positions shown, 18 images · non-contrast
Comparison: 12/15/2010

CLINICAL DATA: Code stroke.  Episode of left facial droop, left
body weakness, and slurred speech.  Symptoms have resolved.

CT HEAD WITHOUT CONTRAST
TECHNIQUE: Contiguous axial images were obtained from the base of
the skull through the vertex without contrast.

[Series 2: (id) head 4.8 h37s st · axial · 0.43mm/px · z∈[-138,-5]mm · 8 of 36 slices shown, 10 images]
[im 4/36  brain]
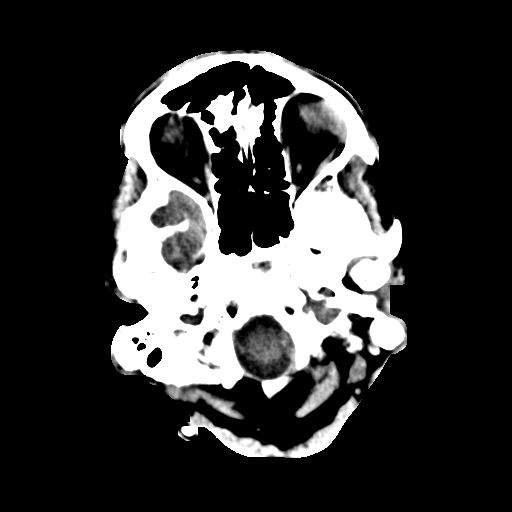
[im 4/36  bone]
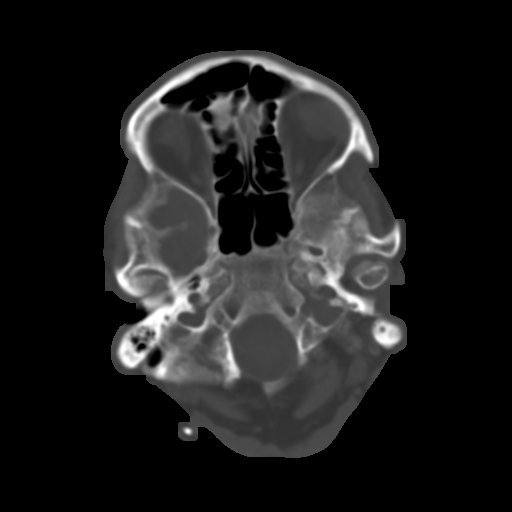
[im 8/36  brain]
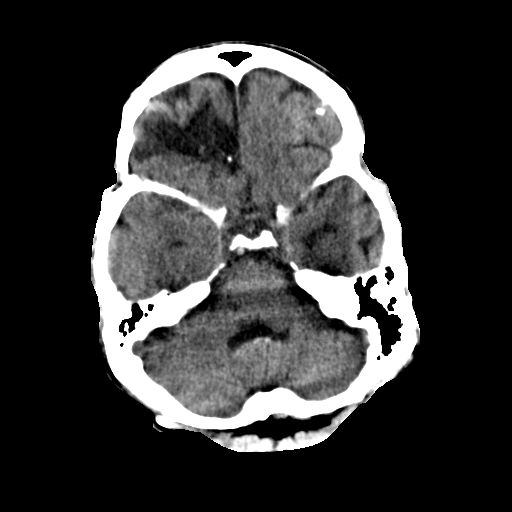
[im 12/36  brain]
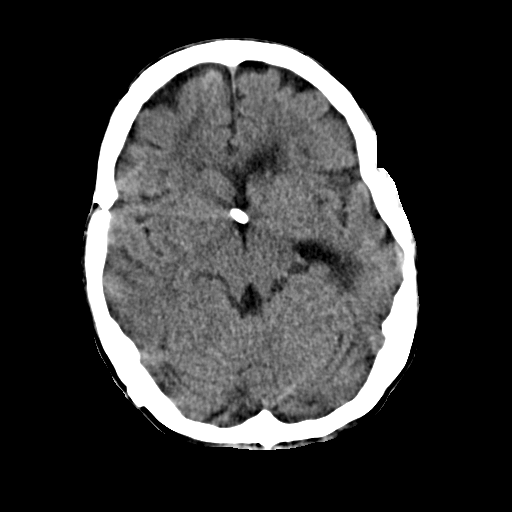
[im 16/36  brain]
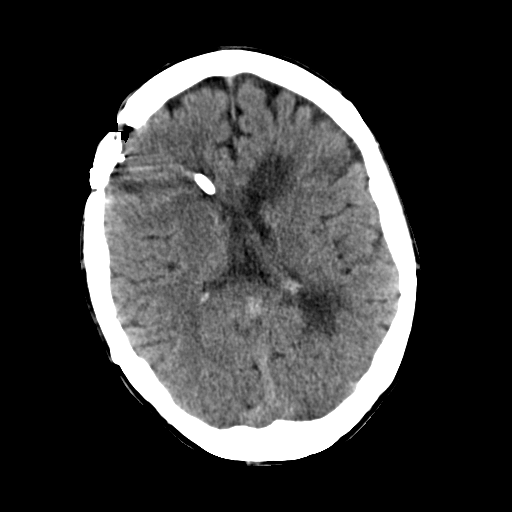
[im 20/36  brain]
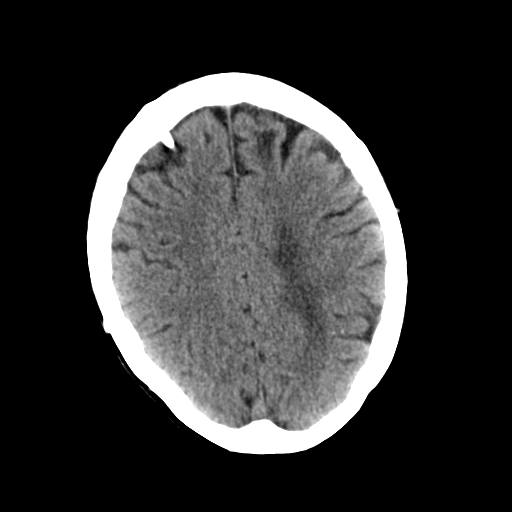
[im 20/36  bone]
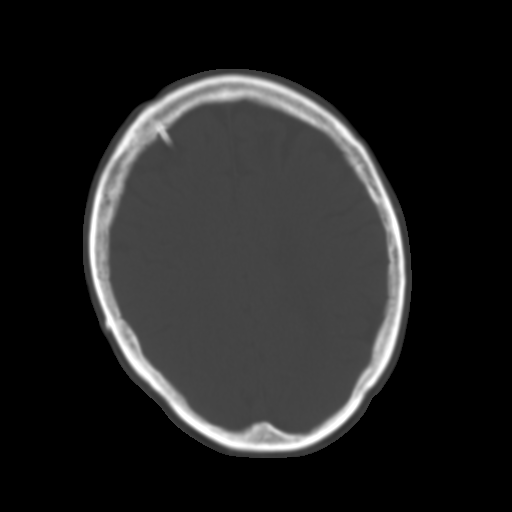
[im 24/36  brain]
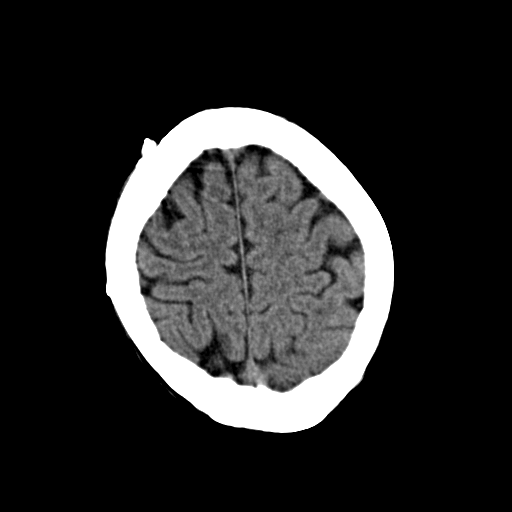
[im 28/36  brain]
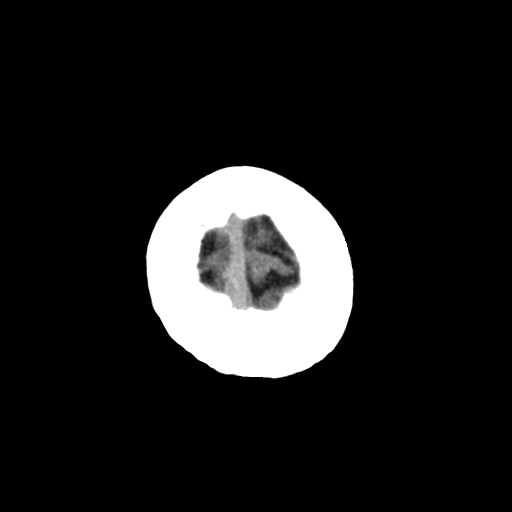
[im 32/36  brain]
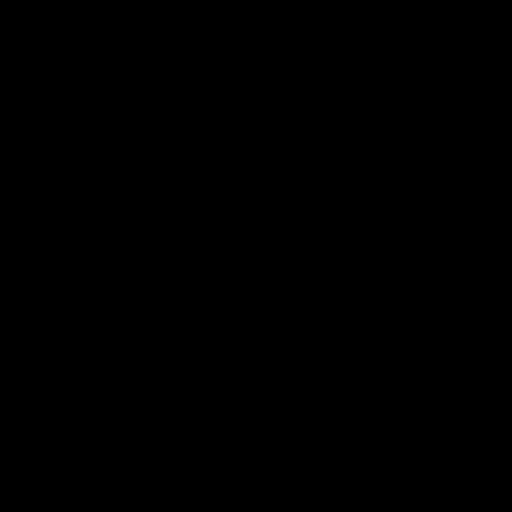

[Series 3: (id) head 2.4 h60s bone · axial · 0.43mm/px · z∈[-137,-4]mm · 8 of 72 slices shown]
[im 8/72  bone]
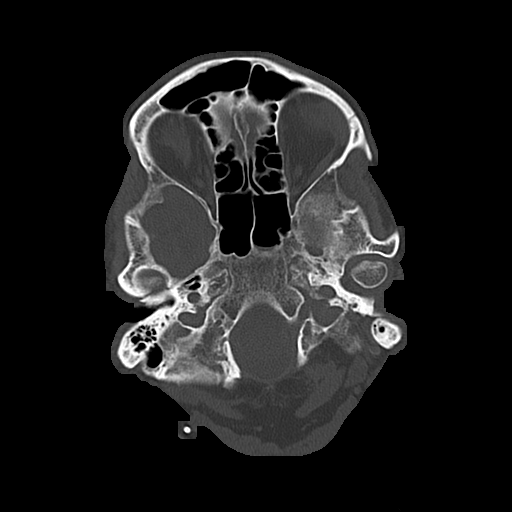
[im 15/72  bone]
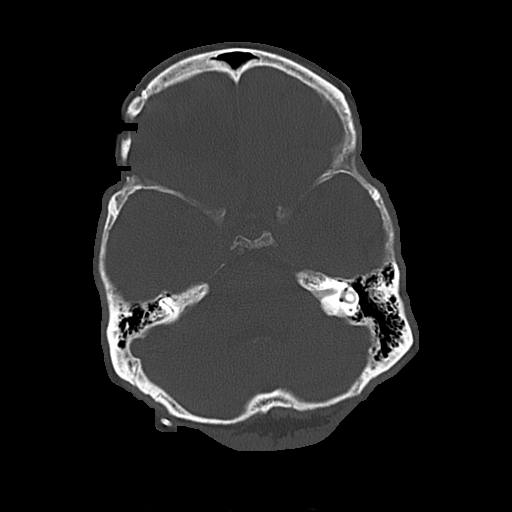
[im 23/72  bone]
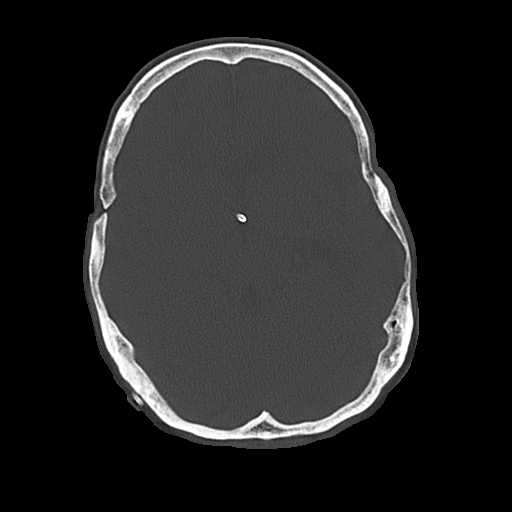
[im 30/72  bone]
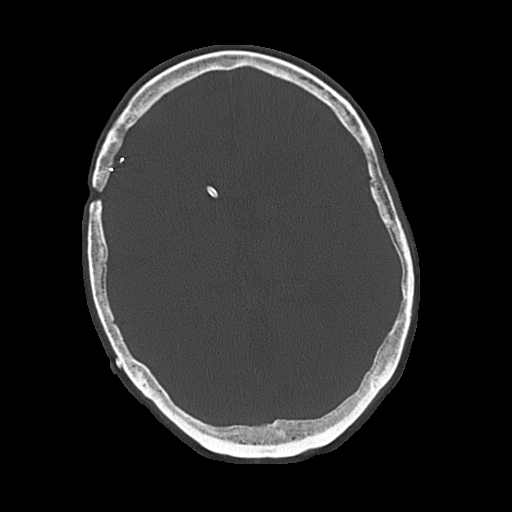
[im 42/72  bone]
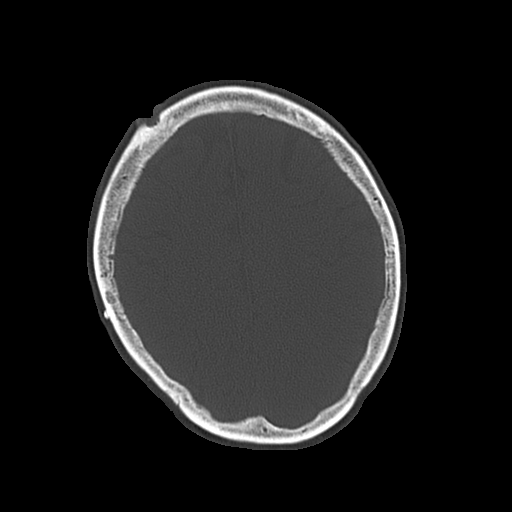
[im 49/72  bone]
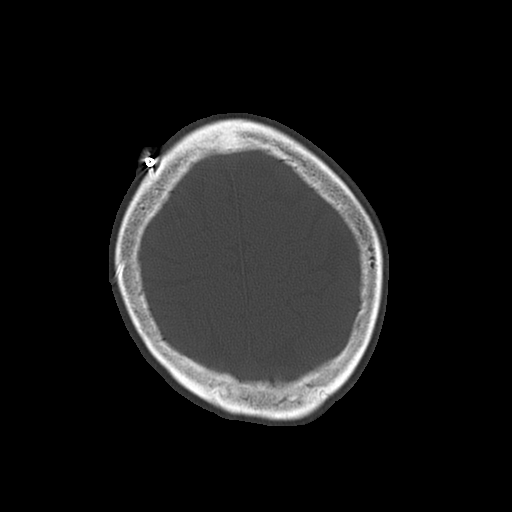
[im 57/72  bone]
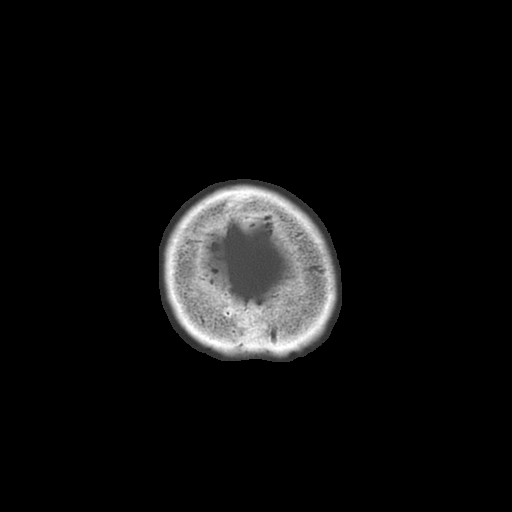
[im 64/72  bone]
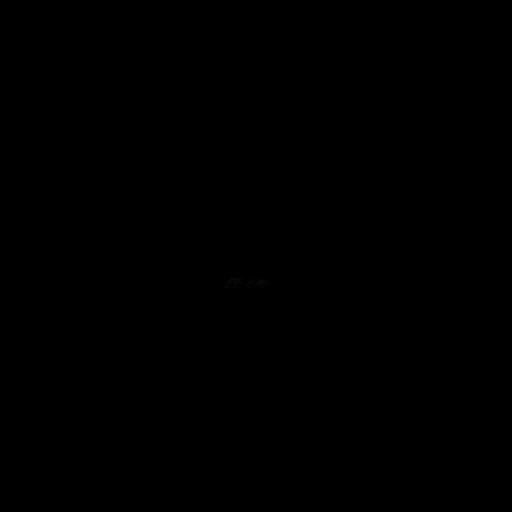

[16 of 30 positions shown; findings below may reference images not displayed]

FINDINGS: Postoperative changes with right frontal temporal
craniotomy and right trans frontal ventricular shunt tube.  Shunt
tube tip is in the third ventricle.  The lateral ventricles are not
dilated.  There is mild dilatation of the fourth ventricle, stable
since previous study.  Diffuse cerebral and cerebellar atrophy.
Encephalomalacia in the right anterior frontal lobe, possibly
postoperative or due to old infarct.  Focal areas of  high density
in both frontal regions are stable and likely represent dystrophic
calcification although.  Correlation with surgical history is
recommended.  Focal low attenuation changes in the left temporal
lobe probably due to small vessel ischemia.  Low attenuation
changes throughout the deep white matter, particularly on the left,
likely due to small vessel ischemia and unchanged.  No developing
sulci effacement or low attenuation change.  Nothing to suggest
acute infarct.  No evidence of acute intracranial hemorrhage.  No
mass effect or midline shift.  No effacement of the basal cisterns.
Opacification of some of the ethmoid air cells with air-fluid level
in the sphenoid sinus.  No depressed skull fractures.
IMPRESSION: Stable appearance intracranial contents since previous study.
Postoperative changes in the right frontotemporal region with right
trans frontal ventricular shunt.  Diffuse atrophy and small vessel
ischemic changes.  Encephalomalacia and possible dystrophic
calcification in the right anterior frontal lobe.  No evidence of
acute infarct or acute intracranial hemorrhage.

Results were telephoned to Dr. Kai-Liis at the time of dictation, 8220
hours on 02/14/2011.

## 2011-12-24 ENCOUNTER — Other Ambulatory Visit: Payer: Self-pay | Admitting: Internal Medicine

## 2012-01-07 ENCOUNTER — Emergency Department (HOSPITAL_COMMUNITY)
Admission: EM | Admit: 2012-01-07 | Discharge: 2012-01-07 | Disposition: A | Discharge status: Home / Self Care | Payer: Medicaid Other | Attending: Emergency Medicine | Admitting: Emergency Medicine

## 2012-01-07 ENCOUNTER — Emergency Department (HOSPITAL_COMMUNITY): Payer: Medicaid Other

## 2012-01-07 ENCOUNTER — Encounter (HOSPITAL_COMMUNITY): Payer: Self-pay | Admitting: Emergency Medicine

## 2012-01-07 DIAGNOSIS — R569 Unspecified convulsions: Secondary | ICD-10-CM

## 2012-01-07 DIAGNOSIS — G40909 Epilepsy, unspecified, not intractable, without status epilepticus: Secondary | ICD-10-CM | POA: Insufficient documentation

## 2012-01-07 DIAGNOSIS — R51 Headache: Secondary | ICD-10-CM | POA: Insufficient documentation

## 2012-01-07 DIAGNOSIS — R259 Unspecified abnormal involuntary movements: Secondary | ICD-10-CM | POA: Insufficient documentation

## 2012-01-07 DIAGNOSIS — Z982 Presence of cerebrospinal fluid drainage device: Secondary | ICD-10-CM | POA: Insufficient documentation

## 2012-01-07 DIAGNOSIS — I10 Essential (primary) hypertension: Secondary | ICD-10-CM | POA: Insufficient documentation

## 2012-01-07 DIAGNOSIS — Z79899 Other long term (current) drug therapy: Secondary | ICD-10-CM | POA: Insufficient documentation

## 2012-01-07 LAB — RAPID URINE DRUG SCREEN, HOSP PERFORMED
Amphetamines: NOT DETECTED
Barbiturates: NOT DETECTED
Benzodiazepines: NOT DETECTED
Cocaine: NOT DETECTED
Opiates: NOT DETECTED
Tetrahydrocannabinol: NOT DETECTED

## 2012-01-07 LAB — COMPREHENSIVE METABOLIC PANEL
ALT: 81 U/L — ABNORMAL HIGH (ref 0–35)
AST: 71 U/L — ABNORMAL HIGH (ref 0–37)
Albumin: 3.4 g/dL — ABNORMAL LOW (ref 3.5–5.2)
Alkaline Phosphatase: 113 U/L (ref 39–117)
BUN: 12 mg/dL (ref 6–23)
CO2: 25 mEq/L (ref 19–32)
Calcium: 8.6 mg/dL (ref 8.4–10.5)
Chloride: 105 mEq/L (ref 96–112)
Creatinine, Ser: 0.52 mg/dL (ref 0.50–1.10)
GFR calc Af Amer: >90 mL/min (ref >90)
GFR calc non Af Amer: >90 mL/min (ref >90)
Glucose, Bld: 95 mg/dL (ref 70–99)
Potassium: 3.4 mEq/L — ABNORMAL LOW (ref 3.5–5.1)
Sodium: 140 mEq/L (ref 135–145)
Total Bilirubin: 0.4 mg/dL (ref 0.3–1.2)
Total Protein: 6.4 g/dL (ref 6.0–8.3)

## 2012-01-07 LAB — PHENYTOIN LEVEL, TOTAL: Phenytoin Lvl: 22.1 ug/mL — ABNORMAL HIGH (ref 10.0–20.0)

## 2012-01-07 LAB — URINALYSIS, ROUTINE W REFLEX MICROSCOPIC
Bilirubin Urine: NEGATIVE
Glucose, UA: NEGATIVE mg/dL
Hgb urine dipstick: NEGATIVE
Ketones, ur: NEGATIVE mg/dL
Nitrite: NEGATIVE
Protein, ur: NEGATIVE mg/dL
Specific Gravity, Urine: 1.013 (ref 1.005–1.030)
Urobilinogen, UA: 0.2 mg/dL (ref 0.0–1.0)
pH: 5.5 (ref 5.0–8.0)

## 2012-01-07 LAB — CBC WITH DIFFERENTIAL/PLATELET
Basophils Absolute: 0 10*3/uL (ref 0.0–0.1)
Basophils Relative: 0 % (ref 0–1)
Eosinophils Absolute: 0.1 10*3/uL (ref 0.0–0.7)
Eosinophils Relative: 1 % (ref 0–5)
HCT: 36 % (ref 36.0–46.0)
Hemoglobin: 12.3 g/dL (ref 12.0–15.0)
Lymphocytes Relative: 32 % (ref 12–46)
Lymphs Abs: 2 10*3/uL (ref 0.7–4.0)
MCH: 34.7 pg — ABNORMAL HIGH (ref 26.0–34.0)
MCHC: 34.2 g/dL (ref 30.0–36.0)
MCV: 101.7 fL — ABNORMAL HIGH (ref 78.0–100.0)
Monocytes Absolute: 0.6 10*3/uL (ref 0.1–1.0)
Monocytes Relative: 10 % (ref 3–12)
Neutro Abs: 3.6 10*3/uL (ref 1.7–7.7)
Neutrophils Relative %: 58 % (ref 43–77)
Platelets: 73 10*3/uL — ABNORMAL LOW (ref 150–400)
RBC: 3.54 MIL/uL — ABNORMAL LOW (ref 3.87–5.11)
RDW: 12.8 % (ref 11.5–15.5)
WBC: 6.2 10*3/uL (ref 4.0–10.5)

## 2012-01-07 LAB — URINE MICROSCOPIC-ADD ON

## 2012-01-07 MED ORDER — PHENYTOIN SODIUM EXTENDED 100 MG PO CAPS: 200.0000 mg | ORAL_CAPSULE | Freq: Two times a day (BID) | ORAL | Status: DC

## 2012-01-07 MED ORDER — LORAZEPAM 2 MG/ML IJ SOLN
1.0000 mg | Freq: Once | INTRAMUSCULAR | Status: AC
Start: 2012-01-07 — End: 2012-01-07
  Administered 2012-01-07: 1 mg via INTRAVENOUS

## 2012-01-07 MED ORDER — DIAZEPAM 10 MG RE GEL: 5.0000 mg | Freq: Once | RECTAL | Status: DC

## 2012-01-07 MED ORDER — LORAZEPAM 2 MG/ML IJ SOLN
INTRAMUSCULAR | Status: AC
  Filled 2012-01-07: qty 1

## 2012-01-07 NOTE — ED Provider Notes (Signed)
History     CSN: [id number]  Arrival date & time 01/07/12  2011   First MD Initiated Contact with Patient 01/07/12 2022      Chief Complaint  Patient presents with  . Seizures    Patient is a 56 year old female with complicated past medical history as listed below and relevant for known seizure disorder, ventricular shunt, and polysubstance abuse who presents after seizure episode. Patient is unaware of events, and thus history is obtained from sister. Sister reports prior to arrival patient had approximately 2 minute episode in which patient's eyes rolled in the back of her head, and had bilateral upper and lower extremity jerking. And then resolve spontaneously and postictal state was noted. Sister then brought patient to the emergency department, while in route patient had similar episode.  Medical staff was brought to sister's vehicle, and seizure activity was noted. However upon arrival in the emergency department bed patient with no seizure activity. Patient reports taking Dilantin as prescribed, however yesterday she did consume moderate amount of alcohol. She denies any fevers, diarrhea, chest pain, shortness of breath and when asked about headache she said she had a mild headache earlier in the day but is now resolved.    (Consider location/radiation/quality/duration/timing/severity/associated sxs/prior treatment) Patient is a 56 y.o. female presenting with seizures. The history is provided by the patient, a relative and medical records. No language interpreter was used.  Seizures  This is a recurrent problem. The current episode started 1 to 2 hours ago. The problem has been resolved. There were 2 to 3 seizures. The most recent episode lasted 30 to 120 seconds. Characteristics include eye deviation and rhythmic jerking. The episode was witnessed. The seizures did not continue in the ED. The seizure(s) had upper extremity, lower extremity, left-sided, right-sided and facial focality.  Possible causes do not include missed seizure meds. There has been no fever. There were no medications administered prior to arrival.    Past Medical History  Diagnosis Date  . Meningitis 2009  . Hx of ventricular shunt 2009  . Seizure disorder   . 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 Suicide attempt     History of suicide attempts August 2006 and July 2006  . History of alcohol abuse   . History of cocaine abuse   . Major depressive disorder     History of  . History of iron deficiency   . COPD (chronic obstructive pulmonary disease)   . History of acute alcoholic hepatitis   . Psychiatric diagnosis     Multiple  . 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 Left breast mass     Past Surgical History  Procedure Date  . Craniotomy   . Breast mass removal     Family History  Problem Relation Age of Onset  . Coronary artery disease Father     History  Substance Use Topics  . Smoking status: Current Everyday Smoker -- 1.0 packs/day    Types: Cigarettes  . Smokeless tobacco: Not on file  . Alcohol Use: Yes    OB History    Grav Para Term Preterm Abortions TAB SAB Ect Mult Living                  Review of Systems  Neurological: Positive for seizures.  All other systems reviewed  and are negative.    Allergies  Review of patient's allergies indicates no known allergies.  Home Medications   Current Outpatient Rx  Name Route Sig Dispense Refill  . ALBUTEROL SULFATE HFA 108 (90 BASE) MCG/ACT IN AERS Inhalation Inhale 1 puff into the lungs every 6 (six) hours as needed. For shortness of breath. 1 Inhaler 6  . ASPIRIN 81 MG PO CHEW Oral Chew 1 tablet (81 mg total) by mouth daily. 30 tablet 6  . FLUTICASONE-SALMETEROL 250-50 MCG/DOSE IN AEPB Inhalation Inhale 1 puff into the lungs every 12 (twelve) hours. 60  each 6  . FOLIC ACID 1 MG PO TABS  TAKE 1 TABLET EACH DAY 30 tablet 5    3 refill(s) transferred in on 07/02/2011 10:06 AM Or ...  . LEVETIRACETAM 500 MG PO TABS Oral Take 1 tablet (500 mg total) by mouth daily. 30 tablet 6  . LISINOPRIL 10 MG PO TABS Oral Take 1 tablet (10 mg total) by mouth daily. 30 tablet 3  . METOPROLOL TARTRATE 50 MG PO TABS Oral Take 1 tablet (50 mg total) by mouth 2 (two) times daily. 60 tablet 6  . ADULT MULTIVITAMIN W/MINERALS CH Oral Take 1 tablet by mouth daily. 30 tablet 6  . PHENYTOIN SODIUM EXTENDED 200 MG PO CAPS Oral Take 1 capsule (200 mg total) by mouth 2 (two) times daily. 60 capsule 6  . SIMVASTATIN 20 MG PO TABS Oral Take 1 tablet (20 mg total) by mouth daily at 6 PM. 30 tablet 6  . THIAMINE HCL 100 MG PO TABS Oral Take 1 tablet (100 mg total) by mouth daily. 30 tablet 6  . TIOTROPIUM BROMIDE MONOHYDRATE 18 MCG IN CAPS Inhalation Place 1 capsule (18 mcg total) into inhaler and inhale daily. 30 capsule 6  . VITAMIN D (ERGOCALCIFEROL) PO Oral Take 1 tablet by mouth daily.        BP 189/110  Pulse 108  Temp 98.4 F (36.9 C) (Oral)  Resp 16  SpO2 99%  Physical Exam  Nursing note and vitals reviewed. Constitutional: She is oriented to person, place, and time. She appears well-developed and well-nourished. No distress.  HENT:  Head: Normocephalic and atraumatic.  Right Ear: External ear normal.  Left Ear: External ear normal.  Nose: Nose normal.  Mouth/Throat: Oropharynx is clear and moist.  Eyes: Conjunctivae normal and EOM are normal. Pupils are equal, round, and reactive to light.  Neck: Normal range of motion. Neck supple.  Cardiovascular: Normal rate, regular rhythm, normal heart sounds and intact distal pulses.  Exam reveals no gallop and no friction rub.   No murmur heard.      During my exam HR noted to be in the 90s  Pulmonary/Chest: Breath sounds normal. No respiratory distress. She has no wheezes. She has no rales. She exhibits no  tenderness.  Abdominal: Soft. Bowel sounds are normal. She exhibits no distension and no mass. There is no tenderness. There is no rebound and no guarding.  Musculoskeletal: Normal range of motion. She exhibits no edema and no tenderness.  Neurological: She is alert and oriented to person, place, and time. She has normal strength and normal reflexes. No cranial nerve deficit or sensory deficit. Coordination normal. GCS eye subscore is 4. GCS verbal subscore is 5. GCS motor subscore is 6.       During exam, patient with brief, spontaneous jerking of bilateral upper and lower extremities which sister reports is her baseline.    Skin: Skin is warm and  dry.  Psychiatric: She has a normal mood and affect.    ED Course  Procedures (including critical care time)  Labs Reviewed  CBC WITH DIFFERENTIAL - Abnormal; Notable for the following:    RBC 3.54 (*)     MCV 101.7 (*)     MCH 34.7 (*)     Platelets 73 (*)     All other components within normal limits  COMPREHENSIVE METABOLIC PANEL - Abnormal; Notable for the following:    Potassium 3.4 (*)     Albumin 3.4 (*)     AST 71 (*)     ALT 81 (*)     All other components within normal limits  URINALYSIS, ROUTINE W REFLEX MICROSCOPIC - Abnormal; Notable for the following:    Leukocytes, UA TRACE (*)     All other components within normal limits  PHENYTOIN LEVEL, TOTAL - Abnormal; Notable for the following:    Phenytoin Lvl 22.1 (*)     All other components within normal limits  URINE MICROSCOPIC-ADD ON - Abnormal; Notable for the following:    Squamous Epithelial / LPF MANY (*)     All other components within normal limits  URINE RAPID DRUG SCREEN (HOSP PERFORMED)  LAB REPORT - SCANNED   Dg Skull Complete  01/07/2012  *RADIOLOGY REPORT*  Clinical Data: Recent seizure.  Severe headache.  SKULL - COMPLETE 4 + VIEW  Comparison: Head CT dated 06/03/2011.  Findings: The right frontal ventriculoperitoneal shunt catheter tubing appears intact.  Right frontal post craniotomy changes are again demonstrated.  IMPRESSION: No acute abnormality.   Original Report Authenticated By: Darrol Angel, M.D.    Dg Chest 1 View  01/07/2012  *RADIOLOGY REPORT*  Clinical Data: Seizure.  Check shunt.  CHEST - 1 VIEW  Comparison: None.  Findings: The visualized portions of the shunt tubing are intact. Remote bilateral rib fractures are again seen.  Chronic interstitial coarsening is stable.  IMPRESSION:  1.  The ventriculostomy shunt tubing is intact. 2.  No acute cardiopulmonary disease.   Original Report Authenticated By: Jamesetta Orleans. MATTERN, M.D.    Dg Abd 1 View  01/07/2012  *RADIOLOGY REPORT*  Clinical Data: Recent seizure.  Evaluate shunt tubing.  ABDOMEN - 1 VIEW  Comparison: Single view abdomen 01/11/2010.  Findings: The most left lateral aspect of the shunt tubing is not included on this view.  The tube is otherwise intact and extends in the abdomen.  Bowel gas pattern is unremarkable.  Degenerative changes are noted in the lower lumbar spine.  IMPRESSION:  1.  The visualized shunt tubing is intact. 2.  A small portion of the most lateral aspect of the tubing is not visualized. 3.  Normal bowel gas pattern.   Original Report Authenticated By: Jamesetta Orleans. MATTERN, M.D.      1. Seizure       MDM    Patient presents to emergency department after seizure activity. She reports taking Dilantin as prescribed, however she did consume moderate amount of alcohol yesterday. Upon arrival in the emergency department patient with no seizure activity, alert and oriented x3, and with no focal neurological deficits. Patient was afebrile and vital signs remarkable only for initial blood pressure of 189/110. Given patient's known history of seizures and on Dilantin, basic labs and Dilantin level were obtained. Labs relevant for mildly LFTs, and phenytoin level at 22 (upper limit of normal 20).  VP shunt series obtained to rule out shunt malfunction, and  results unremarkable. Patient was  given Ativan upon arrival in the emergency department for prophylactic treatment and no additional seizure activity was noted.  After observation in the emergency department, patient's blood pressure noted to be 140s over 80s and patient was without new complaints.  She was given instructions to followup with PCP as soon as possible.  She was also given instructions regarding no driving, operating heavy equipment, etc.  she was also advised to stop consuming alcohol as this could be contributed to seizure activity.  He voiced understanding and was in agreement for PCP follow.  Johnney Ou, MD 01/08/12 1434

## 2012-01-07 NOTE — ED Notes (Signed)
PT 'S SISTER REPORTS PT. HAS A SEIZURE EPISODE AT HOME THIS EVENING , ARRIVED WITH BRIEF EPISODE OF GENERALIZED ARMS AND FACE TWITCHINGS , STATES HISTORY SEIZURES , PT. RESPONDED VERBALLY TO DR. Michael Boston AT ARRIVAL .

## 2012-01-07 NOTE — ED Notes (Addendum)
TRANSPORTED TO RADIOLOGY.

## 2012-01-08 NOTE — ED Provider Notes (Signed)
I saw and evaluated the patient, reviewed the resident's note and I agree with the findings and plan. Pt with sz today that are not new for the pt.  She is till taking her dilantin and it is mildly elevated but not toxic.  Pt also is still drinking.  Labs and shunt series are unremarkable.  Pt is at baseline and will d/c home.  Gwyneth Sprout, MD 01/08/12 1530

## 2012-01-25 ENCOUNTER — Inpatient Hospital Stay (HOSPITAL_COMMUNITY)
Admission: EM | Admit: 2012-01-25 | Discharge: 2012-01-28 | DRG: 315 | Disposition: A | Discharge status: Home / Self Care | Payer: Medicaid Other | Attending: Internal Medicine | Admitting: Internal Medicine

## 2012-01-25 ENCOUNTER — Encounter (HOSPITAL_COMMUNITY): Payer: Self-pay | Admitting: Emergency Medicine

## 2012-01-25 DIAGNOSIS — F17210 Nicotine dependence, cigarettes, uncomplicated: Secondary | ICD-10-CM | POA: Diagnosis present

## 2012-01-25 DIAGNOSIS — Z7982 Long term (current) use of aspirin: Secondary | ICD-10-CM

## 2012-01-25 DIAGNOSIS — I428 Other cardiomyopathies: Principal | ICD-10-CM | POA: Diagnosis present

## 2012-01-25 DIAGNOSIS — Z8661 Personal history of infections of the central nervous system: Secondary | ICD-10-CM

## 2012-01-25 DIAGNOSIS — M6250 Muscle wasting and atrophy, not elsewhere classified, unspecified site: Secondary | ICD-10-CM | POA: Diagnosis present

## 2012-01-25 DIAGNOSIS — F1010 Alcohol abuse, uncomplicated: Secondary | ICD-10-CM | POA: Diagnosis present

## 2012-01-25 DIAGNOSIS — F17200 Nicotine dependence, unspecified, uncomplicated: Secondary | ICD-10-CM | POA: Diagnosis present

## 2012-01-25 DIAGNOSIS — G9389 Other specified disorders of brain: Secondary | ICD-10-CM | POA: Diagnosis present

## 2012-01-25 DIAGNOSIS — Z23 Encounter for immunization: Secondary | ICD-10-CM

## 2012-01-25 DIAGNOSIS — T420X5A Adverse effect of hydantoin derivatives, initial encounter: Secondary | ICD-10-CM | POA: Diagnosis present

## 2012-01-25 DIAGNOSIS — R269 Unspecified abnormalities of gait and mobility: Secondary | ICD-10-CM | POA: Diagnosis present

## 2012-01-25 DIAGNOSIS — R413 Other amnesia: Secondary | ICD-10-CM | POA: Diagnosis present

## 2012-01-25 DIAGNOSIS — J4489 Other specified chronic obstructive pulmonary disease: Secondary | ICD-10-CM | POA: Diagnosis present

## 2012-01-25 DIAGNOSIS — G40409 Other generalized epilepsy and epileptic syndromes, not intractable, without status epilepticus: Secondary | ICD-10-CM

## 2012-01-25 DIAGNOSIS — T50901A Poisoning by unspecified drugs, medicaments and biological substances, accidental (unintentional), initial encounter: Secondary | ICD-10-CM

## 2012-01-25 DIAGNOSIS — J449 Chronic obstructive pulmonary disease, unspecified: Secondary | ICD-10-CM | POA: Diagnosis present

## 2012-01-25 DIAGNOSIS — I2510 Atherosclerotic heart disease of native coronary artery without angina pectoris: Secondary | ICD-10-CM | POA: Diagnosis present

## 2012-01-25 DIAGNOSIS — R569 Unspecified convulsions: Secondary | ICD-10-CM | POA: Diagnosis present

## 2012-01-25 DIAGNOSIS — T420X1A Poisoning by hydantoin derivatives, accidental (unintentional), initial encounter: Secondary | ICD-10-CM

## 2012-01-25 DIAGNOSIS — Z720 Tobacco use: Secondary | ICD-10-CM

## 2012-01-25 DIAGNOSIS — I5032 Chronic diastolic (congestive) heart failure: Secondary | ICD-10-CM | POA: Diagnosis present

## 2012-01-25 DIAGNOSIS — Z79899 Other long term (current) drug therapy: Secondary | ICD-10-CM

## 2012-01-25 DIAGNOSIS — R29898 Other symptoms and signs involving the musculoskeletal system: Secondary | ICD-10-CM | POA: Diagnosis present

## 2012-01-25 DIAGNOSIS — Z982 Presence of cerebrospinal fluid drainage device: Secondary | ICD-10-CM

## 2012-01-25 DIAGNOSIS — Z681 Body mass index (BMI) 19 or less, adult: Secondary | ICD-10-CM

## 2012-01-25 DIAGNOSIS — Z87891 Personal history of nicotine dependence: Secondary | ICD-10-CM | POA: Diagnosis present

## 2012-01-25 DIAGNOSIS — G40909 Epilepsy, unspecified, not intractable, without status epilepticus: Secondary | ICD-10-CM | POA: Diagnosis present

## 2012-01-25 DIAGNOSIS — T50904A Poisoning by unspecified drugs, medicaments and biological substances, undetermined, initial encounter: Secondary | ICD-10-CM

## 2012-01-25 LAB — BASIC METABOLIC PANEL
BUN: 12 mg/dL (ref 6–23)
CO2: 26 mEq/L (ref 19–32)
Calcium: 8.5 mg/dL (ref 8.4–10.5)
Chloride: 102 mEq/L (ref 96–112)
Creatinine, Ser: 0.67 mg/dL (ref 0.50–1.10)
GFR calc Af Amer: >90 mL/min (ref >90)
GFR calc non Af Amer: >90 mL/min (ref >90)
Glucose, Bld: 109 mg/dL — ABNORMAL HIGH (ref 70–99)
Potassium: 4.1 mEq/L (ref 3.5–5.1)
Sodium: 137 mEq/L (ref 135–145)

## 2012-01-25 LAB — PHENYTOIN LEVEL, TOTAL: Phenytoin Lvl: 40.2 ug/mL (ref 10.0–20.0)

## 2012-01-25 LAB — CBC WITH DIFFERENTIAL/PLATELET
Basophils Absolute: 0 10*3/uL (ref 0.0–0.1)
Basophils Relative: 0 % (ref 0–1)
Eosinophils Absolute: 0.1 10*3/uL (ref 0.0–0.7)
Eosinophils Relative: 2 % (ref 0–5)
HCT: 39.7 % (ref 36.0–46.0)
Hemoglobin: 13.4 g/dL (ref 12.0–15.0)
Lymphocytes Relative: 30 % (ref 12–46)
Lymphs Abs: 2 10*3/uL (ref 0.7–4.0)
MCH: 34.8 pg — ABNORMAL HIGH (ref 26.0–34.0)
MCHC: 33.8 g/dL (ref 30.0–36.0)
MCV: 103.1 fL — ABNORMAL HIGH (ref 78.0–100.0)
Monocytes Absolute: 0.5 10*3/uL (ref 0.1–1.0)
Monocytes Relative: 8 % (ref 3–12)
Neutro Abs: 3.9 10*3/uL (ref 1.7–7.7)
Neutrophils Relative %: 60 % (ref 43–77)
Platelets: 146 10*3/uL — ABNORMAL LOW (ref 150–400)
RBC: 3.85 MIL/uL — ABNORMAL LOW (ref 3.87–5.11)
RDW: 13 % (ref 11.5–15.5)
WBC: 6.6 10*3/uL (ref 4.0–10.5)

## 2012-01-25 LAB — GLUCOSE, CAPILLARY: Glucose-Capillary: 108 mg/dL — ABNORMAL HIGH (ref 70–99)

## 2012-01-25 LAB — ETHANOL: Alcohol, Ethyl (B): <11 mg/dL (ref 0–11)

## 2012-01-25 MED ORDER — LISINOPRIL 10 MG PO TABS
10.0000 mg | ORAL_TABLET | Freq: Every day | ORAL | Status: DC
  Administered 2012-01-26 – 2012-01-28 (×3): 10 mg via ORAL
  Filled 2012-01-25 (×3): qty 1

## 2012-01-25 MED ORDER — SIMVASTATIN 20 MG PO TABS
20.0000 mg | ORAL_TABLET | Freq: Every day | ORAL | Status: DC
  Administered 2012-01-26 – 2012-01-27 (×2): 20 mg via ORAL
  Filled 2012-01-25 (×3): qty 1

## 2012-01-25 MED ORDER — METOPROLOL TARTRATE 50 MG PO TABS
50.0000 mg | ORAL_TABLET | Freq: Two times a day (BID) | ORAL | Status: DC
  Administered 2012-01-25 – 2012-01-28 (×5): 50 mg via ORAL
  Filled 2012-01-25 (×7): qty 1

## 2012-01-25 MED ORDER — LORAZEPAM 2 MG/ML IJ SOLN
1.0000 mg | INTRAMUSCULAR | Status: DC | PRN
  Administered 2012-01-25 – 2012-01-27 (×5): 1 mg via INTRAVENOUS
  Filled 2012-01-25 (×5): qty 1

## 2012-01-25 MED ORDER — ACETAMINOPHEN 325 MG PO TABS
650.0000 mg | ORAL_TABLET | Freq: Four times a day (QID) | ORAL | Status: DC | PRN
  Administered 2012-01-26 (×2): 650 mg via ORAL
  Filled 2012-01-25 (×2): qty 2

## 2012-01-25 MED ORDER — ONDANSETRON HCL 4 MG PO TABS: 4.0000 mg | ORAL_TABLET | Freq: Four times a day (QID) | ORAL | Status: DC | PRN

## 2012-01-25 MED ORDER — ALUM & MAG HYDROXIDE-SIMETH 200-200-20 MG/5ML PO SUSP: 30.0000 mL | Freq: Four times a day (QID) | ORAL | Status: DC | PRN

## 2012-01-25 MED ORDER — ASPIRIN 81 MG PO CHEW
81.0000 mg | CHEWABLE_TABLET | Freq: Every day | ORAL | Status: DC
  Administered 2012-01-26 – 2012-01-27 (×2): 81 mg via ORAL
  Filled 2012-01-25 (×3): qty 1

## 2012-01-25 MED ORDER — ADULT MULTIVITAMIN W/MINERALS CH
1.0000 | ORAL_TABLET | Freq: Every day | ORAL | Status: DC
  Administered 2012-01-26 – 2012-01-28 (×3): 1 via ORAL
  Filled 2012-01-25 (×3): qty 1

## 2012-01-25 MED ORDER — NICOTINE 21 MG/24HR TD PT24
21.0000 mg | MEDICATED_PATCH | Freq: Once | TRANSDERMAL | Status: AC
  Administered 2012-01-25: 21 mg via TRANSDERMAL
  Filled 2012-01-25: qty 1

## 2012-01-25 MED ORDER — ALBUTEROL SULFATE HFA 108 (90 BASE) MCG/ACT IN AERS
1.0000 | INHALATION_SPRAY | Freq: Four times a day (QID) | RESPIRATORY_TRACT | Status: DC | PRN
Start: 2012-01-25 — End: 2012-01-28
  Administered 2012-01-27 – 2012-01-28 (×2): 1 via RESPIRATORY_TRACT
  Filled 2012-01-25 (×2): qty 6.7

## 2012-01-25 MED ORDER — ONDANSETRON HCL 4 MG/2ML IJ SOLN: 4.0000 mg | Freq: Four times a day (QID) | INTRAMUSCULAR | Status: DC | PRN

## 2012-01-25 MED ORDER — ONDANSETRON HCL 4 MG/2ML IJ SOLN: 4.0000 mg | Freq: Three times a day (TID) | INTRAMUSCULAR | Status: DC | PRN

## 2012-01-25 MED ORDER — TIOTROPIUM BROMIDE MONOHYDRATE 18 MCG IN CAPS
18.0000 ug | ORAL_CAPSULE | Freq: Every day | RESPIRATORY_TRACT | Status: DC
  Administered 2012-01-26 – 2012-01-28 (×3): 18 ug via RESPIRATORY_TRACT
  Filled 2012-01-25: qty 5

## 2012-01-25 MED ORDER — SODIUM CHLORIDE 0.9 % IJ SOLN
3.0000 mL | Freq: Two times a day (BID) | INTRAMUSCULAR | Status: DC
  Administered 2012-01-25 – 2012-01-28 (×3): 3 mL via INTRAVENOUS

## 2012-01-25 MED ORDER — FOLIC ACID 1 MG PO TABS
1.0000 mg | ORAL_TABLET | Freq: Every day | ORAL | Status: DC
  Administered 2012-01-26 – 2012-01-28 (×3): 1 mg via ORAL
  Filled 2012-01-25 (×3): qty 1

## 2012-01-25 MED ORDER — VITAMIN B-1 100 MG PO TABS
100.0000 mg | ORAL_TABLET | Freq: Every day | ORAL | Status: DC
Start: 2012-01-26 — End: 2012-01-28
  Administered 2012-01-26 – 2012-01-28 (×3): 100 mg via ORAL
  Filled 2012-01-25 (×3): qty 1

## 2012-01-25 MED ORDER — NICOTINE 14 MG/24HR TD PT24
14.0000 mg | MEDICATED_PATCH | Freq: Once | TRANSDERMAL | Status: DC
  Administered 2012-01-25: 14 mg via TRANSDERMAL
  Filled 2012-01-25: qty 1

## 2012-01-25 MED ORDER — SODIUM CHLORIDE 0.9 % IJ SOLN
3.0000 mL | INTRAMUSCULAR | Status: DC | PRN
  Administered 2012-01-27: 3 mL via INTRAVENOUS

## 2012-01-25 MED ORDER — INFLUENZA VIRUS VACC SPLIT PF IM SUSP
0.5000 mL | INTRAMUSCULAR | Status: AC
  Administered 2012-01-26: 0.5 mL via INTRAMUSCULAR
  Filled 2012-01-25: qty 0.5

## 2012-01-25 MED ORDER — SODIUM CHLORIDE 0.9 % IV SOLN: 250.0000 mL | INTRAVENOUS | Status: DC | PRN

## 2012-01-25 MED ORDER — FLUTICASONE-SALMETEROL 250-50 MCG/DOSE IN AEPB
1.0000 | INHALATION_SPRAY | Freq: Two times a day (BID) | RESPIRATORY_TRACT | Status: DC
  Administered 2012-01-26 – 2012-01-28 (×5): 1 via RESPIRATORY_TRACT
  Filled 2012-01-25: qty 14

## 2012-01-25 MED ORDER — ACETAMINOPHEN 650 MG RE SUPP: 650.0000 mg | Freq: Four times a day (QID) | RECTAL | Status: DC | PRN

## 2012-01-25 MED ORDER — SODIUM CHLORIDE 0.9 % IJ SOLN
3.0000 mL | Freq: Two times a day (BID) | INTRAMUSCULAR | Status: DC
  Administered 2012-01-25: 3 mL via INTRAVENOUS

## 2012-01-25 NOTE — ED Notes (Addendum)
Per ems.  Called out for seizure like activity.  Upon arrival pt was shaking, but was able to respond to ems.  EMS reports that pt was combative upon arrival, but was able to calm down.  Pt alert oriented and calm at present.  Pt states she did not have ha before seizure as she usually does.  Pt states she has been taking her dilantin regularly as prescribed.  Pt reports that she had a seizure a week ago, and has had weakness and not felt good for week.  Pt states her dilantin was recently increased from 100 to 200.

## 2012-01-25 NOTE — H&P (Addendum)
History and Physical  Wanda Prince ZOX:[id number] DOB: 24-Nov-1955 DOA: 01/25/2012  Referring physician: Nelva Nay, MD PCP: [facility]--has appointment with new PCP, cannot recall, family will bring information in 9/29  Chief Complaint: seizure  HPI:  56 year old woman presented to ED with complaint of seizure earlier 9/28 around 2 pm. Was at mother's house when she started to "feel bad" and subsequently had seizure. Workup in the ED was notable for Dilantin toxicity, mild ataxia. There was concern over timely outpatient follow-up and monitoring and patient was referred for admission.  Patient has felt generally poor last few days but no specific localizing symptoms. She has a history of a seizure disorder s/p meningitis 2009 with prolonged hospitalization and placement of VP shunt. She was seen in ED 9/10 for seizure, workup including basic labs and shunt series were unremarkable with the exception of mildly elevated Dilantin level of 22.1. According to patient she was told to decrease Dilantin dose which she did for a "few days" but then had a seizure and so resumed chronic dosing of 200 mg BID.  She has a history of alcohol use (3 beers/day) but reports last drink was 9/26).  History supplemented by sister Elease Hashimoto at bedside who reports patient appears to be at baseline. Patient does have short-term memory loss and it is not clear that she takes her medication as directed.  Chart Review: 9/10 ED visit--seizure, alcohol use. lAfter observation in the emergency department, patient's blood pressure noted to be 140s over 80s and patient was without new complaints. She was given instructions to followup with PCP as soon as possible  Review of Systems:  Negative for fever, visual changes, sore throat, rash, new muscle aches, chest pain, SOB, dysuria, bleeding, n/v/abdominal pain, falls.  Past Medical History  Diagnosis Date  . Meningitis 2009  . Hx of ventricular  shunt 2009  . Seizure disorder   . 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 Suicide attempt     History of suicide attempts August 2006 and July 2006  . History of alcohol abuse   . History of cocaine abuse   . Major depressive disorder     History of  . History of iron deficiency   . COPD (chronic obstructive pulmonary disease)   . History of acute alcoholic hepatitis   . Psychiatric diagnosis     Multiple  . 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 Left breast mass     Past Surgical History  Procedure Date  . Craniotomy   . Breast mass removal     Social History:  reports that she has been smoking Cigarettes.  She has been smoking about 1 pack per day. She does not have any smokeless tobacco history on file. She reports that she drinks alcohol. She reports that she does not use illicit drugs.  No Known Allergies  Family History  Problem Relation Age of Onset  . Coronary artery disease Father      Prior to Admission medications   Medication Sig Start Date End Date Taking? Authorizing Provider  albuterol (PROVENTIL HFA;VENTOLIN HFA) 108 (90 BASE) MCG/ACT inhaler Inhale 1 puff into the lungs every 6 (six) hours as needed. For shortness of breath. 06/07/11  Yes Manuela Schwartz, MD  aspirin 81 MG chewable tablet Chew 1 tablet (  81 mg total) by mouth daily. 06/07/11 06/06/12 Yes Manuela Schwartz, MD  diazepam (DIASTAT ACUDIAL) 10 MG GEL Place 10 mg rectally as needed. For seizures.   Yes Historical Provider, MD  Fluticasone-Salmeterol (ADVAIR) 250-50 MCG/DOSE AEPB Inhale 1 puff into the lungs every 12 (twelve) hours. 06/07/11  Yes Manuela Schwartz, MD  folic acid (FOLVITE) 1 MG tablet TAKE 1 TABLET EACH DAY 12/24/11  Yes Fnu Nutan, MD  lisinopril (PRINIVIL,ZESTRIL) 10 MG tablet Take 1 tablet (10 mg total) by mouth  daily. 06/07/11 06/06/12 Yes Manuela Schwartz, MD  metoprolol (LOPRESSOR) 50 MG tablet Take 1 tablet (50 mg total) by mouth 2 (two) times daily. 06/07/11 06/06/12 Yes Manuela Schwartz, MD  Multiple Vitamin (MULITIVITAMIN WITH MINERALS) TABS Take 1 tablet by mouth daily. 06/07/11  Yes Manuela Schwartz, MD  phenytoin (DILANTIN) 100 MG ER capsule Take 2 capsules (200 mg total) by mouth 2 (two) times daily. 01/07/12 01/06/13 Yes Johnney Ou, MD  simvastatin (ZOCOR) 20 MG tablet Take 1 tablet (20 mg total) by mouth daily at 6 PM. 06/07/11 06/06/12 Yes Manuela Schwartz, MD  thiamine 100 MG tablet Take 1 tablet (100 mg total) by mouth daily. 06/07/11  Yes Manuela Schwartz, MD  tiotropium (SPIRIVA) 18 MCG inhalation capsule Place 1 capsule (18 mcg total) into inhaler and inhale daily. 06/07/11  Yes Manuela Schwartz, MD  Vitamin D, Ergocalciferol, (DRISDOL) 50000 UNITS CAPS Take 50,000 Units by mouth every 7 (seven) days.   Yes Historical Provider, MD   Physical Exam: Filed Vitals:   01/25/12 1458 01/25/12 1654 01/25/12 1700 01/25/12 1800  BP: 121/70 126/79 128/75 123/75  Pulse: 66 75 73 73  Temp:  98.1 F (36.7 C)  98.2 F (36.8 C)  TempSrc:  Oral    Resp: 19 24 21 23   SpO2: 96% 98% 98% 96%   General:  Examined in ED. Appears calm and comfortable. Eyes: Pupils, lids, irises all appear normal ENT: grossly normal hearing, lips & tongue Neck: no LAD, masses or thyromegaly Cardiovascular: RRR, no m/r/g. No LE edema. Telemetry: SR, no arrhythmias (in ED) Respiratory: CTA bilaterally, no w/r/r. Normal respiratory effort. Abdomen: soft, ntnd Skin: no rash or induration seen on limited exam Musculoskeletal: grossly normal tone BUE/BLE, digits and nails upper and lower extremities appear normal Psychiatric: grossly normal mood and affect, speech fluent and appropriate Neurologic: grossly non-focal.  Labs on Admission:  Basic Metabolic Panel:  Lab 01/25/12 6387  NA 137  K  4.1  CL 102  CO2 26  GLUCOSE 109*  BUN 12  CREATININE 0.67  CALCIUM 8.5  MG --  PHOS --   CBG:  Lab 01/25/12 1502  GLUCAP 108*   EKG: Independently reviewed. SR, no acute changes.   Principal Problem:  *Phenytoin toxicity Active Problems:  COPD (chronic obstructive pulmonary disease)  Tobacco user  Alcohol abuse  Seizure disorder  Non-ischemic cardiomyopathy   Assessment/Plan 1. Phenytoin toxicity--mild symptoms (no slurred speech, n/v, mental status changes or dysrhythmias) however patient has no follow-up until late October. Supportive care. Check phenytoin level, CMP in AM. Consult pharmacy for recommendations for outpatient regimen. Will need close follow-up with PCP as outpatient. I have discussed with sister Elease Hashimoto. Patient lives alone, but is regularly visited by sisters and mother. I have suggested setting up a weekly pill container so that proper use of phenytoin can be monitored and supply limited. 2. Seizure disorder--appears stable. Use PRN benzodiazapines as needed. 3. History of ventricular shunt 2009  4. History of polysubstance abuse per record--alcohol level negative. Recommend complete cessation from alcohol and cigarettes. I discussed with patient and sister the danger of alcohol given her seziure disorder and specifically advised that alcohol use is a danger to her health and life. Nicotine patch. CSW consult. There is no sign of alcohol withdrawal. 5. History of non-obstructive CAD 6. History of non-ischemic cardiomyopathy--appears stable. LVEF 30-35% 06/04/11. Continue ASA, beta-blocker, ACE-I, statin. 7. History of COPD, not oxygen-dependent--continue PRN albuterol MDI, Advair, Spiriva.  Code Status: Full code Family Communication: discussed at bedside with sister Elease Hashimoto with patient's permission Disposition Plan: home 9/29 if stable  Time spent: 55 minutes  Brendia Sacks, MD  [facility]  Pager [phone] no answer or after 2 AM please  contact night-coverage at [email or web address], password [facility] 01/25/2012, 6:48 PM

## 2012-01-25 NOTE — ED Notes (Signed)
ZOX:WRUE<AV> Expected date:<BR> Expected time:<BR> Means of arrival:<BR> Comments:<BR> ems combative

## 2012-01-25 NOTE — ED Provider Notes (Signed)
History     CSN: [id number]  Arrival date & time 01/25/12  1445   First MD Initiated Contact with Patient 01/25/12 1518      Chief Complaint  Patient presents with  . Seizures     HPI Per ems. Called out for seizure like activity. Upon arrival pt was shaking, but was able to respond to ems. EMS reports that pt was combative upon arrival, but was able to calm down. Pt alert oriented and calm at present. Pt states she did not have ha before seizure as she usually does. Pt states she has been taking her dilantin regularly as prescribed. Pt reports that she had a seizure a week ago, and has had weakness and not felt good for week. Pt states her dilantin was recently increased from 100 to 200  Past Medical History  Diagnosis Date  . Meningitis 2009  . Hx of ventricular shunt 2009  . Seizure disorder   . 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 Suicide attempt     History of suicide attempts August 2006 and July 2006  . History of alcohol abuse   . History of cocaine abuse   . Major depressive disorder     History of  . History of iron deficiency   . COPD (chronic obstructive pulmonary disease)   . History of acute alcoholic hepatitis   . Psychiatric diagnosis     Multiple  . 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 Left breast mass     Past Surgical History  Procedure Date  . Craniotomy   . Breast mass removal     Family History  Problem Relation Age of Onset  . Coronary artery disease Father     History  Substance Use Topics  . Smoking status: Current Every Day Smoker -- 1.0 packs/day    Types: Cigarettes  . Smokeless tobacco: Not on file  . Alcohol Use: Yes    OB History    Grav Para Term Preterm Abortions TAB SAB Ect Mult Living                  Review of Systems  All other systems reviewed and are  negative.    Allergies  Review of patient's allergies indicates no known allergies.  Home Medications   No current outpatient prescriptions on file.  BP 140/81  Pulse 68  Temp 98.6 F (37 C) (Oral)  Resp 18  Ht 5\' 8"  (1.727 m)  Wt 112 lb 3.4 oz (50.9 kg)  BMI 17.06 kg/m2  SpO2 98%  Physical Exam  Nursing note and vitals reviewed. Constitutional: She is oriented to person, place, and time. She appears well-developed. No distress.  HENT:  Head: Normocephalic and atraumatic.  Eyes: Pupils are equal, round, and reactive to light.  Neck: Normal range of motion.  Cardiovascular: Normal rate and intact distal pulses.   Pulmonary/Chest: No respiratory distress.  Abdominal: Normal appearance. She exhibits no distension.  Musculoskeletal: Normal range of motion.  Neurological: She is alert and oriented to person, place, and time. She has normal strength. No cranial nerve deficit. GCS eye subscore is 4. GCS verbal subscore is 5. GCS motor subscore is 6.  Skin: Skin is warm and dry. No rash noted.  Psychiatric: She has a normal mood and affect. Her behavior is normal.  ED Course  Procedures (including critical care time)  Labs Reviewed  PHENYTOIN LEVEL, TOTAL - Abnormal; Notable for the following:    Phenytoin Lvl 40.2 (*)     All other components within normal limits  BASIC METABOLIC PANEL - Abnormal; Notable for the following:    Glucose, Bld 109 (*)     All other components within normal limits  GLUCOSE, CAPILLARY - Abnormal; Notable for the following:    Glucose-Capillary 108 (*)     All other components within normal limits  CBC WITH DIFFERENTIAL - Abnormal; Notable for the following:    RBC 3.85 (*)     MCV 103.1 (*)     MCH 34.8 (*)     Platelets 146 (*)     All other components within normal limits  MRSA PCR SCREENING - Abnormal; Notable for the following:    MRSA by PCR INVALID RESULTS, SPECIMEN SENT FOR CULTURE (*)     All other components within normal  limits  COMPREHENSIVE METABOLIC PANEL - Abnormal; Notable for the following:    Creatinine, Ser 0.41 (*)     Albumin 3.2 (*)     AST 55 (*)     ALT 64 (*)     Alkaline Phosphatase 122 (*)     All other components within normal limits  ETHANOL  PHENYTOIN LEVEL, TOTAL  MRSA CULTURE  MAGNESIUM  LACTIC ACID, PLASMA  MRSA PCR SCREENING   No results found.   1. Dilantin toxicity   2. Seizure disorder   3. COPD (chronic obstructive pulmonary disease)   4. Non-ischemic cardiomyopathy   5. Tobacco user       MDM          Nelia Shi, MD 01/26/12 1039

## 2012-01-26 ENCOUNTER — Encounter (HOSPITAL_COMMUNITY): Payer: Self-pay | Admitting: *Deleted

## 2012-01-26 ENCOUNTER — Observation Stay (HOSPITAL_COMMUNITY): Payer: Medicaid Other

## 2012-01-26 LAB — CK: Total CK: 52 U/L (ref 7–177)

## 2012-01-26 LAB — COMPREHENSIVE METABOLIC PANEL
ALT: 64 U/L — ABNORMAL HIGH (ref 0–35)
AST: 55 U/L — ABNORMAL HIGH (ref 0–37)
Albumin: 3.2 g/dL — ABNORMAL LOW (ref 3.5–5.2)
Alkaline Phosphatase: 122 U/L — ABNORMAL HIGH (ref 39–117)
BUN: 10 mg/dL (ref 6–23)
CO2: 23 mEq/L (ref 19–32)
Calcium: 8.8 mg/dL (ref 8.4–10.5)
Chloride: 102 mEq/L (ref 96–112)
Creatinine, Ser: 0.41 mg/dL — ABNORMAL LOW (ref 0.50–1.10)
GFR calc Af Amer: >90 mL/min (ref >90)
GFR calc non Af Amer: >90 mL/min (ref >90)
Glucose, Bld: 80 mg/dL (ref 70–99)
Potassium: 4.3 mEq/L (ref 3.5–5.1)
Sodium: 135 mEq/L (ref 135–145)
Total Bilirubin: 0.4 mg/dL (ref 0.3–1.2)
Total Protein: 6.4 g/dL (ref 6.0–8.3)

## 2012-01-26 LAB — RPR: RPR Ser Ql: NONREACTIVE

## 2012-01-26 LAB — LACTIC ACID, PLASMA: Lactic Acid, Venous: 1.7 mmol/L (ref 0.5–2.2)

## 2012-01-26 LAB — MRSA PCR SCREENING
MRSA by PCR: INVALID — AB
MRSA by PCR: NEGATIVE

## 2012-01-26 LAB — VITAMIN B12: Vitamin B-12: 491 pg/mL (ref 211–911)

## 2012-01-26 LAB — RAPID URINE DRUG SCREEN, HOSP PERFORMED
Amphetamines: NOT DETECTED
Barbiturates: NOT DETECTED
Benzodiazepines: NOT DETECTED
Cocaine: NOT DETECTED
Opiates: NOT DETECTED
Tetrahydrocannabinol: NOT DETECTED

## 2012-01-26 LAB — PHENYTOIN LEVEL, TOTAL: Phenytoin Lvl: 50.8 ug/mL (ref 10.0–20.0)

## 2012-01-26 LAB — MAGNESIUM: Magnesium: 1.7 mg/dL (ref 1.5–2.5)

## 2012-01-26 MED ORDER — NICOTINE 21 MG/24HR TD PT24
21.0000 mg | MEDICATED_PATCH | Freq: Every day | TRANSDERMAL | Status: DC
  Administered 2012-01-26 – 2012-01-27 (×2): 21 mg via TRANSDERMAL
  Filled 2012-01-26 (×3): qty 1

## 2012-01-26 MED ORDER — SODIUM CHLORIDE 0.9 % IV SOLN
500.0000 mg | Freq: Two times a day (BID) | INTRAVENOUS | Status: DC
  Administered 2012-01-26 – 2012-01-27 (×2): 500 mg via INTRAVENOUS
  Filled 2012-01-26 (×3): qty 5

## 2012-01-26 NOTE — Consult Note (Addendum)
Consulting physician: Dr. Janee Morn    Date of Consult: 01/26/2012 Chief Complaint:  Breakthrough seizure HPI: This is a 56 y/o RHWF with a known seizure disorder for approximately 15 years. She mentions that she was originally on Phenobarb for her seizures and the Neurologist who took care of him, he closed his practiced and moved away. Then several years ago she had meningitis and s/p VP shunt placement and [facility] and has been taking dilantin. She has had also alcohol withdrawal seizures in the past. She also agrees to doing cocaine and she has not done cocaine for approximately 12 months.  She mentions that she does take her medications regularly.  She has difficulty with managing her dilantin level. Either she is extremely high and or low. I do believe having alcohol on board is affecting the levels of dilantin. She is awake, alert and following commands. She is significant short of breath because of COPD  She denies non compliance. She denies abusing pain medications, Xanax and she is not on SSRI which all have high risk for seizures  Past Medical History  Diagnosis Date  . Meningitis 2009  . Hx of ventricular shunt 2009  . Seizure disorder   . 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 Suicide attempt     History of suicide attempts August 2006 and July 2006  . History of alcohol abuse   . History of cocaine abuse   . Major depressive disorder     History of  . History of iron deficiency   . COPD (chronic obstructive pulmonary disease)   . History of acute alcoholic hepatitis   . Psychiatric diagnosis     Multiple  . 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 Left breast mass     Past Surgical History  Procedure Date  . Craniotomy   . Breast mass removal     Family History    Problem Relation Age of Onset  . Coronary artery disease Father    Social History:  reports that she has been smoking Cigarettes.  She has been smoking about 1 pack per day. She does not have any smokeless tobacco history on file. She reports that she drinks alcohol. She reports that she does not use illicit drugs.  Has history of cocaine abuse and marijuana   Allergies: No Known Allergies  Medications Prior to Admission  Medication Sig Dispense Refill  . albuterol (PROVENTIL HFA;VENTOLIN HFA) 108 (90 BASE) MCG/ACT inhaler Inhale 1 puff into the lungs every 6 (six) hours as needed. For shortness of breath.  1 Inhaler  6  . aspirin 81 MG chewable tablet Chew 1 tablet (81 mg total) by mouth daily.  30 tablet  6  . diazepam (DIASTAT ACUDIAL) 10 MG GEL Place 10 mg rectally as needed. For seizures.      . Fluticasone-Salmeterol (ADVAIR) 250-50 MCG/DOSE AEPB Inhale 1 puff into the lungs every 12 (twelve) hours.  60 each  6  . folic acid (FOLVITE) 1 MG tablet TAKE 1 TABLET EACH DAY  30 tablet  5  . lisinopril (PRINIVIL,ZESTRIL) 10 MG tablet Take 1 tablet (10 mg total) by mouth daily.  30 tablet  3  . metoprolol (LOPRESSOR) 50 MG tablet Take 1 tablet (50 mg total) by mouth 2 (two) times  daily.  60 tablet  6  . Multiple Vitamin (MULITIVITAMIN WITH MINERALS) TABS Take 1 tablet by mouth daily.  30 tablet  6  . phenytoin (DILANTIN) 100 MG ER capsule Take 2 capsules (200 mg total) by mouth 2 (two) times daily.  120 capsule  1  . simvastatin (ZOCOR) 20 MG tablet Take 1 tablet (20 mg total) by mouth daily at 6 PM.  30 tablet  6  . thiamine 100 MG tablet Take 1 tablet (100 mg total) by mouth daily.  30 tablet  6  . tiotropium (SPIRIVA) 18 MCG inhalation capsule Place 1 capsule (18 mcg total) into inhaler and inhale daily.  30 capsule  6  . Vitamin D, Ergocalciferol, (DRISDOL) 50000 UNITS CAPS Take 50,000 Units by mouth every 7 (seven) days.        ROS: All 12 point review of systems were reviewed and  pertinent has mentioned in the HPI  Physical Examination: Blood pressure 130/69, pulse 70, temperature 98.7 F (37.1 C), temperature source Oral, resp. rate 23, height 5\' 8"  (1.727 m), weight 50.9 kg (112 lb 3.4 oz), SpO2 97.00%.  General Examination:    HEENT-  Normocephalic, no lesions, without obvious abnormality.  Normal external eye and conjunctiva.  Normal TM's bilaterally.  Normal auditory canals and external ears. Normal external nose, mucus membranes and septum.  Normal pharynx., No nystagamus, no gingival hyperplasia Neck supple with no masses, nodes, nodules or enlargement. Cardiovascular - RRR, no murmurs, rubs and or gallops Lungs - Rhonchi  Abdomen -  Bowel sounds present Extremities - no edema  Neurologic Examination: Mental Status: Alert, oriented, thought content appropriate.  Speech fluent without evidence of aphasia.  Able to follow 3 step commands without difficulty. Cranial Nerves: II: visual fields grossly normal, pupils equal, round, reactive to light and accommodation III,IV, VI: ptosis not present, extra-ocular motions intact bilaterally V,VII: smile symmetric, facial light touch sensation normal bilaterally VIII: hearing normal bilaterally IX,X: gag reflex present XI: trapezius strength/neck flexion strength normal bilaterally XII: tongue strength normal  Motor: Proximal muscles 4+/5 Distal 5-/5 Symmetrical  However she has significant weakness in proximal muscles, she cannot stand up with arms crossed. She has significant muscle wasting.   Sensory: Pinprick and light touch, temp and vibration  intact throughout, bilaterally Deep Tendon Reflexes: 2+ and symmetric throughout, no clonus, no brisk Plantars: Right: downgoing   Left: downgoing Cerebellar: normal finger-to-nose,  Gait: Significantly affected, She mentioned her gait has been abnormal.  Romberg : Positive Gait is not wide based, no astasia,  She is just weak with proximal  muscles    Laboratory Studies: Basic Metabolic Panel:  Lab 01/26/12 4540 01/26/12 0722 01/25/12 1513  NA -- 135 137  K -- 4.3 4.1  CL -- 102 102  CO2 -- 23 26  GLUCOSE -- 80 109*  BUN -- 10 12  CREATININE -- 0.41* 0.67  CALCIUM -- 8.8 8.5  MG 1.7 -- --  PHOS -- -- --    Liver Function Tests:  Lab 01/26/12 0722  AST 55*  ALT 64*  ALKPHOS 122*  BILITOT 0.4  PROT 6.4  ALBUMIN 3.2*   No results found for this basename: LIPASE:5,AMYLASE:5 in the last 168 hours No results found for this basename: AMMONIA:3 in the last 168 hours  CBC:  Lab 01/25/12 1513  WBC 6.6  NEUTROABS 3.9  HGB 13.4  HCT 39.7  MCV 103.1*  PLT 146*    Cardiac Enzymes: No results found for this basename: CKTOTAL:5,CKMB:5,CKMBINDEX:5,TROPONINI:5  in the last 168 hours  BNP: No components found with this basename: POCBNP:5  CBG:  Lab 01/25/12 1502  GLUCAP 108*    Microbiology: Results for orders placed during the hospital encounter of 01/25/12  MRSA PCR SCREENING     Status: Abnormal   Collection Time   01/26/12  5:45 AM      Component Value Range Status Comment   MRSA by PCR INVALID RESULTS, SPECIMEN SENT FOR CULTURE (*) NEGATIVE Final   MRSA PCR SCREENING     Status: Normal   Collection Time   01/26/12 10:12 AM      Component Value Range Status Comment   MRSA by PCR NEGATIVE  NEGATIVE Final     Coagulation Studies: No results found for this basename: LABPROT:5,INR:5 in the last 72 hours  Urinalysis: No results found for this basename: COLORURINE:2,APPERANCEUR:2,LABSPEC:2,PHURINE:2,GLUCOSEU:2,HGBUR:2,BILIRUBINUR:2,KETONESUR:2,PROTEINUR:2,UROBILINOGEN:2,NITRITE:2,LEUKOCYTESUR:2 in the last 168 hours  Lipid Panel:    Component Value Date/Time   CHOL 214* 11/03/2008 2017   TRIG 146 11/03/2008 2017   HDL 101 11/03/2008 2017   CHOLHDL 2.1 Ratio 11/03/2008 2017   VLDL 29 11/03/2008 2017   LDLCALC 84 11/03/2008 2017    HgbA1C: No results found for this basename: HGBA1C    Urine Drug  Screen:     Component Value Date/Time   LABOPIA NONE DETECTED 01/26/2012 1442   LABOPIA NEGATIVE 02/08/2011 0732   COCAINSCRNUR NONE DETECTED 01/26/2012 1442   COCAINSCRNUR NEGATIVE 02/08/2011 0732   LABBENZ NONE DETECTED 01/26/2012 1442   LABBENZ POSITIVE* 02/08/2011 0732   AMPHETMU NONE DETECTED 01/26/2012 1442   AMPHETMU NEGATIVE 02/08/2011 0732   THCU NONE DETECTED 01/26/2012 1442   LABBARB NONE DETECTED 01/26/2012 1442     Alcohol Level:  Lab 01/25/12 1513  ETH <11    Other results: EKG: normal EKG, normal sinus rhythm, unchanged from previous tracings.  Imaging: Ct Head Wo Contrast  01/26/2012  *RADIOLOGY REPORT*  Clinical Data: Seizure workup.  Status post meningitis.  CT HEAD WITHOUT CONTRAST  Technique:  Contiguous axial images were obtained from the base of the skull through the vertex without contrast.  Comparison: Most recent MR head 06/03/2011.  Most recent CT 06/03/2011.  Findings: The patient has undergone ventriculoperitoneal shunting from a right frontal approach.  The right lateral ventricle and third ventricle are decompressed.  The left ventricle remains mildly dilated, similar to priors, most notable in the left temporal horn.  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 Chronic changes of dystrophic calcification in the left frontal lobe.  No acute features are identified such as increased edema, mass effect, or cortical hypodensity. No acute intracranial hemorrhage.  No extra-axial fluid collection.  Calvarium intact except for previous craniotomy. Clear sinuses and mastoids.  IMPRESSION: Stable exam.  Ventricles remain well decompressed on the right with chronic left ventriculomegaly.  Possible trapped left lateral ventricle without interval increase in size.  Right frontal encephalomalacia is stable.  No signs of acute or developing infection.   Original Report Authenticated By: Elsie Stain, M.D.     Assessment/Plan Patient  with seizures for the past 15 years and was treated initially with phenobarb. She then had meningitis and that required VP shunt placement. Patient presented earlier during this month with dilantin level of 22. It was perfectly okay, did not needed to be adjusted. Then she was told to increase the dilantin level. Levels are affected based on several factors.  Several generic companies have dilantin and each of them vary in their  bioavailability. So when she refills her prescription it depends upon which generic company the pharmacy company had purchased from. She does not show clinical signs of dilantin toxicity There is no nystagmus, no cerebellar signs, no nausea, no vomiting, no dizziness, no hyperreflexia, no gingival hyperplasia, no hair loss, nothing.  Her gait disturbance is multifactorial, Muscle weakness, right frontal encephalomalacia, left ventricular dilation, alcohol, and muscle atrophy all in addition to nutrition  is affecting her  Ataxia- it is rather balance issues rather than ataxia.  Which could be central and or peripheral. Additionally, Statin could also be contributing to her muscle weakness  Patient has significant complications from Dilantin, also she is refusing to quit alcohol  Since she is already on therapeutic Dilantin.  We can switch her to Keppra that would not interfere with alcohol   Recommendations: Because the dilantin is therapeutic, start Keppra  500 mg po bid and d/c dilantin  Check B12,  Folate, CPK, RPR,  Advised patient not to drive, operate heavy machinery, not to baby sit, no to be involved with activities associated with water and or it is at heights Advised patient to limit the use of  alcohol and quit smoking   Approximately 60 minutes with the patient   Krystine Pabst V-P Eilleen Kempf., MD., Ph.D.,MS 01/26/2012 4:00 PM 01/26/2012, 4:00 PM

## 2012-01-26 NOTE — Evaluation (Signed)
Physical Therapy Evaluation Patient Details Name: Wanda Prince MRN: [id number] DOB: 1955-07-16 Today's Date: 01/26/2012 Time: 4098-1191 PT Time Calculation (min): 16 min  PT Assessment / Plan / Recommendation Clinical Impression  pt pleasant and cooperative, flat affect throughout PT eval,  decreased intiation; will benfit from PT to maximize independence and safety     PT Assessment  Patient needs continued PT services    Follow Up Recommendations  Skilled nursing facility    Barriers to Discharge        Equipment Recommendations  Rolling walker with 5" wheels    Recommendations for Other Services     Frequency Min 3X/week    Precautions / Restrictions Precautions Precautions: Fall   Pertinent Vitals/Pain       Mobility  Bed Mobility Bed Mobility: Not assessed Details for Bed Mobility Assistance: pt sitting crossed legged in middle of bed  Transfers Transfers: Sit to Stand;Stand to Sit Sit to Stand: 4: Min assist;From bed Stand to Sit: To bed;4: Min assist Details for Transfer Assistance: cues for hand placement and safety Ambulation/Gait Ambulation/Gait Assistance: 4: Min assist Ambulation Distance (Feet): 60 Feet Assistive device: Rolling walker Ambulation/Gait Assistance Details: cues for RW safety, min assist for balance Gait Pattern: Step-to pattern;Step-through pattern;Right foot flat;Left foot flat General Gait Details: all movements very choppy    Shoulder Instructions     Exercises     PT Diagnosis: Difficulty walking  PT Problem List: Decreased activity tolerance;Decreased balance;Decreased mobility;Decreased safety awareness;Decreased knowledge of use of DME PT Treatment Interventions: DME instruction;Gait training;Functional mobility training;Therapeutic activities;Therapeutic exercise;Balance training;Patient/family education   PT Goals Acute Rehab PT Goals PT Goal Formulation: With patient Time For Goal Achievement: 02/09/12 Potential to  Achieve Goals: Good Pt will go Supine/Side to Sit: with supervision PT Goal: Supine/Side to Sit - Progress: Goal set today Pt will go Sit to Stand: with supervision PT Goal: Sit to Stand - Progress: Goal set today Pt will go Stand to Sit: with supervision PT Goal: Stand to Sit - Progress: Goal set today Pt will Ambulate: 51 - 150 feet;with supervision;with least restrictive assistive device PT Goal: Ambulate - Progress: Goal set today  Visit Information  Last PT Received On: 01/26/12 Assistance Needed: +1 (+1.5 on eval for safety)    Subjective Data  Subjective: I am ok Patient Stated Goal: none   Prior Functioning  Home Living Lives With: Alone Type of Home: Apartment Home Layout: One level Home Adaptive Equipment: Straight cane Prior Function Level of Independence: Independent with assistive device(s) Communication Communication: No difficulties    Cognition  Overall Cognitive Status: Appears within functional limits for tasks assessed/performed Arousal/Alertness: Awake/alert Orientation Level: Oriented X4 / Intact Behavior During Session: Flat affect    Extremity/Trunk Assessment Right Upper Extremity Assessment RUE ROM/Strength/Tone: [facility] for tasks assessed Left Upper Extremity Assessment LUE ROM/Strength/Tone: [facility] for tasks assessed Right Lower Extremity Assessment RLE ROM/Strength/Tone: [facility] for tasks assessed Left Lower Extremity Assessment LLE ROM/Strength/Tone: WFL for tasks assessed   Balance    End of Session PT - End of Session Equipment Utilized During Treatment: Gait belt Activity Tolerance: Patient tolerated treatment well Patient left: in bed;with call bell/phone within reach;with bed alarm set  GP Functional Assessment Tool Used: clinical judgement Functional Limitation: Mobility: Walking and moving around Mobility: Walking and Moving Around Current Status (Y7829): At least 1 percent but less than 20 percent impaired, limited or restricted Mobility:  Walking and Moving Around Goal Status [id number]): At least 1 percent but less than 20  percent impaired, limited or restricted   [facility] 01/26/2012, 4:09 PM

## 2012-01-26 NOTE — Progress Notes (Signed)
Patient in route to CT.

## 2012-01-26 NOTE — Progress Notes (Signed)
MEDICATION RELATED CONSULT NOTE - INITIAL   Pharmacy Consult for Phenytoin Indication: Seizures  No Known Allergies  Patient Measurements: Height: 5\' 8"  (172.7 cm) Weight: 112 lb 3.4 oz (50.9 kg) IBW/kg (Calculated) : 63.9    Vital Signs: Temp: 97.8 F (36.6 C) (09/28 2237) Temp src: Oral (09/28 2237) BP: 142/72 mmHg (09/28 2237) Pulse Rate: 75  (09/28 2237) Intake/Output from previous day: 09/28 0701 - 09/29 0700 In: 120 [P.O.:120] Out: -  Intake/Output from this shift: Total I/O In: 120 [P.O.:120] Out: -   Labs:  Basename 01/25/12 1513  WBC 6.6  HGB 13.4  HCT 39.7  PLT 146*  APTT --  CREATININE 0.67  LABCREA --  CREATININE 0.67  CREAT24HRUR --  MG --  PHOS --  ALBUMIN --  PROT --  ALBUMIN --  AST --  ALT --  ALKPHOS --  BILITOT --  BILIDIR --  IBILI --   Estimated Creatinine Clearance: 65.3 ml/min (by C-G formula based on Cr of 0.67).   Microbiology: No results found for this or any previous visit (from the past 720 hour(s)).  Medical History: Past Medical History  Diagnosis Date  . Meningitis 2009  . Hx of ventricular shunt 2009  . Seizure disorder   . 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 Suicide attempt     History of suicide attempts August 2006 and July 2006  . History of alcohol abuse   . History of cocaine abuse   . Major depressive disorder     History of  . History of iron deficiency   . COPD (chronic obstructive pulmonary disease)   . History of acute alcoholic hepatitis   . Psychiatric diagnosis     Multiple  . 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 Left breast mass     Medications:  Scheduled:    . aspirin  81 mg Oral Daily  . Fluticasone-Salmeterol  1 puff Inhalation Q12H  . folic acid  1 mg Oral Daily  . influenza  inactive virus vaccine  0.5 mL  Intramuscular Tomorrow-1000  . lisinopril  10 mg Oral Daily  . metoprolol  50 mg Oral BID  . multivitamin with minerals  1 tablet Oral Daily  . nicotine  21 mg Transdermal Once  . simvastatin  20 mg Oral q1800  . sodium chloride  3 mL Intravenous Q12H  . sodium chloride  3 mL Intravenous Q12H  . thiamine  100 mg Oral Daily  . tiotropium  18 mcg Inhalation Daily  . DISCONTD: nicotine  14 mg Transdermal Once   Infusions:    Assessment:  56 year old female with history of seizures s/p meningitis in 2009 with VP shunt placement  Patient on regiment of Dilantin 200mg  po BID  Per patient, last dose was on 9/28  Phenytoin level upon admission = 40.2 mcg/ml  Scr = 0.67.  Albumin unknown at this time  Free phenytoin level & CMet ordered with AM labs on 9/29  Noted history of alcohol use  Questionable medication compliance  Goal of Therapy:   Therapeutic phenytoin level = 10-20 mcg/ml  Seizure-free  Plan:   No active phenytoin order now due to elevated level  Follow-up albumin with CMET and free phenytoin level in AM and provide dosing accordingly.  Momoko Slezak, Joselyn Glassman, PharmD 01/26/2012,1:10 AM

## 2012-01-26 NOTE — Progress Notes (Addendum)
CRITICAL VALUE ALERT  Critical value received:  Dilantin, 50.8  Date of notification:  01/26/12  Time of notification:  1131  Critical value read back:yes  Nurse who received alert:  D.Alexius Hangartner  MD notified (1st page):  1135  Time of first page:  1135  MD notified (2nd page):  Time of second page:  Responding MD:  Janee Morn   Time MD responded:  1136

## 2012-01-26 NOTE — Progress Notes (Signed)
[facility] PROGRESS NOTE  Wanda Prince ZOX:[id number] DOB: [birth year] DOA: 01/25/2012 PCP: Pcp Not In System  Assessment/Plan: Principal Problem:  *Phenytoin toxicity Active Problems:  COPD (chronic obstructive pulmonary disease)  Tobacco user  Alcohol abuse  Seizure disorder  Non-ischemic cardiomyopathy  #1 seizure Questionable etiology. May be secondary to elevated Dilantin level. Patient with no seizure overnight. Head CT was not done on admission and as such will get one now. Alcohol level on admission was less than 11. Will check a lactic acid level. Will check a magnesium level. Dilantin is on hold secondary to elevated Dilantin level. Will repeat Dilantin level this morning. Will consult with neurology for further evaluation and management. Continue seizure precautions.  #2 phenytoin toxicity Will repeat a phenytoin level this morning. Phenytoin is on hold.  #3 tobacco abuse We'll place on a nicotine patch. Patient has been counseled on tobacco cessation.  #4 COPD stable  #5 nonischemic cardiomyopathy Stable. Continue lisinopril, Lopressor, Zocor, aspirin.  #6 prophylaxis SCDs for DVT prophylaxis.  Code Status: Full Family Communication: Updated patient. No family at bedside. Disposition Plan: Home when medically stable   Brief narrative: 56 year old woman presented to ED with complaint of seizure earlier 9/28 around 2 pm. Was at mother's house when she started to "feel bad" and subsequently had seizure. Workup in the ED was notable for Dilantin toxicity, mild ataxia. There was concern over timely outpatient follow-up and monitoring and patient was referred for admission.  Patient has felt generally poor last few days but no specific localizing symptoms. She has a history of a seizure disorder s/p meningitis 2009 with prolonged hospitalization and placement of VP shunt. She was seen in ED 9/10 for seizure, workup including basic labs and shunt series were  unremarkable with the exception of mildly elevated Dilantin level of 22.1. According to patient she was told to decrease Dilantin dose which she did for a "few days" but then had a seizure and so resumed chronic dosing of 200 mg BID.  She has a history of alcohol use (3 beers/day) but reports last drink was 9/26).  History supplemented by sister Elease Hashimoto at bedside who reports patient appears to be at baseline. Patient does have short-term memory loss and it is not clear that she takes her medication as directed.   Consultants:  Neurology pending  Procedures:  None  Antibiotics:  None  HPI/Subjective: Patient denies any seizures overnight. Patient states she is hungry. Patient asking for nicotine patch. No complaints. Patient denies any sudden cessation of alcohol. Patient states drinks about 2-3 beers per day.  Objective: Filed Vitals:   01/25/12 1800 01/25/12 2237 01/26/12 0605 01/26/12 0828  BP: 123/75 142/72 140/81   Pulse: 73 75 68   Temp: 98.2 F (36.8 C) 97.8 F (36.6 C) 98.6 F (37 C)   TempSrc:  Oral Oral   Resp: 23 20 18    Height:  5\' 8"  (1.727 m)    Weight:  50.9 kg (112 lb 3.4 oz)    SpO2: 96% 99% 96% 98%    Intake/Output Summary (Last 24 hours) at 01/26/12 8119 Last data filed at 01/26/12 0542  Gross per 24 hour  Intake    120 ml  Output    700 ml  Net   -580 ml   Filed Weights   01/25/12 2237  Weight: 50.9 kg (112 lb 3.4 oz)    Exam:   General:  NAD  Cardiovascular: RRR  Respiratory: CTAB  Abdomen: Soft/NT/ND/+BS  Data Reviewed: Basic  Metabolic Panel:  Lab 01/26/12 1610 01/25/12 1513  NA 135 137  K 4.3 4.1  CL 102 102  CO2 23 26  GLUCOSE 80 109*  BUN 10 12  CREATININE 0.41* 0.67  CALCIUM 8.8 8.5  MG -- --  PHOS -- --   Liver Function Tests:  Lab 01/26/12 0722  AST 55*  ALT 64*  ALKPHOS 122*  BILITOT 0.4  PROT 6.4  ALBUMIN 3.2*   No results found for this basename: LIPASE:5,AMYLASE:5 in the last 168 hours No results  found for this basename: AMMONIA:5 in the last 168 hours CBC:  Lab 01/25/12 1513  WBC 6.6  NEUTROABS 3.9  HGB 13.4  HCT 39.7  MCV 103.1*  PLT 146*   Cardiac Enzymes: No results found for this basename: CKTOTAL:5,CKMB:5,CKMBINDEX:5,TROPONINI:5 in the last 168 hours BNP (last 3 results) No results found for this basename: PROBNP:3 in the last 8760 hours CBG:  Lab 01/25/12 1502  GLUCAP 108*    Recent Results (from the past 240 hour(s))  MRSA PCR SCREENING     Status: Abnormal   Collection Time   01/26/12  5:45 AM      Component Value Range Status Comment   MRSA by PCR INVALID RESULTS, SPECIMEN SENT FOR CULTURE (*) NEGATIVE Final      Studies: No results found.  Scheduled Meds:   . aspirin  81 mg Oral Daily  . Fluticasone-Salmeterol  1 puff Inhalation Q12H  . folic acid  1 mg Oral Daily  . influenza  inactive virus vaccine  0.5 mL Intramuscular Tomorrow-1000  . lisinopril  10 mg Oral Daily  . metoprolol  50 mg Oral BID  . multivitamin with minerals  1 tablet Oral Daily  . nicotine  21 mg Transdermal Once  . nicotine  21 mg Transdermal Daily  . simvastatin  20 mg Oral q1800  . sodium chloride  3 mL Intravenous Q12H  . sodium chloride  3 mL Intravenous Q12H  . thiamine  100 mg Oral Daily  . tiotropium  18 mcg Inhalation Daily  . DISCONTD: nicotine  14 mg Transdermal Once   Continuous Infusions:   Principal Problem:  *Phenytoin toxicity Active Problems:  COPD (chronic obstructive pulmonary disease)  Tobacco user  Alcohol abuse  Seizure disorder  Non-ischemic cardiomyopathy    Time spent: 60 mins    [facility]  [facility] Pager [phone]. If 8PM-8AM, please contact night-coverage at [email or web address], password [email or web address] 01/26/2012, 9:37 AM  LOS: 1 day

## 2012-01-26 NOTE — Progress Notes (Addendum)
Patient crying stating she wants to go home.  Administered Ativan. Bed alarm ON

## 2012-01-27 LAB — CBC
HCT: 46.9 % — ABNORMAL HIGH (ref 36.0–46.0)
Hemoglobin: 15.6 g/dL — ABNORMAL HIGH (ref 12.0–15.0)
MCH: 33.8 pg (ref 26.0–34.0)
MCHC: 33.3 g/dL (ref 30.0–36.0)
MCV: 101.5 fL — ABNORMAL HIGH (ref 78.0–100.0)
Platelets: 145 10*3/uL — ABNORMAL LOW (ref 150–400)
RBC: 4.62 MIL/uL (ref 3.87–5.11)
RDW: 12.7 % (ref 11.5–15.5)
WBC: 6.6 10*3/uL (ref 4.0–10.5)

## 2012-01-27 LAB — BASIC METABOLIC PANEL
BUN: 13 mg/dL (ref 6–23)
CO2: 27 mEq/L (ref 19–32)
Calcium: 9.1 mg/dL (ref 8.4–10.5)
Chloride: 98 mEq/L (ref 96–112)
Creatinine, Ser: 0.53 mg/dL (ref 0.50–1.10)
GFR calc Af Amer: >90 mL/min (ref >90)
GFR calc non Af Amer: >90 mL/min (ref >90)
Glucose, Bld: 89 mg/dL (ref 70–99)
Potassium: 4.1 mEq/L (ref 3.5–5.1)
Sodium: 135 mEq/L (ref 135–145)

## 2012-01-27 MED ORDER — MAGNESIUM SULFATE 40 MG/ML IJ SOLN
2.0000 g | Freq: Once | INTRAMUSCULAR | Status: AC
  Administered 2012-01-27: 2 g via INTRAVENOUS
  Filled 2012-01-27: qty 50

## 2012-01-27 MED ORDER — LEVETIRACETAM 500 MG PO TABS
500.0000 mg | ORAL_TABLET | Freq: Two times a day (BID) | ORAL | Status: DC
  Administered 2012-01-27 – 2012-01-28 (×2): 500 mg via ORAL
  Filled 2012-01-27 (×3): qty 1

## 2012-01-27 NOTE — Progress Notes (Signed)
[facility] PROGRESS NOTE  Wanda Prince:[id number] DOB: 12/12/1955 DOA: 01/25/2012 PCP: Pcp Not In System  Assessment/Plan: Principal Problem:  *Phenytoin toxicity Active Problems:  COPD (chronic obstructive pulmonary disease)  Tobacco user  Alcohol abuse  Seizure disorder  Non-ischemic cardiomyopathy  #1 seizure Questionable etiology. May be secondary to Dilantin toxicity.  Patient with no seizure overnight. Head CT negative. Alcohol level on admission was less than 11. Lactic acid level 1.7  Magnesium level = 1.7. Dilantin is on hold secondary to elevated Dilantin level. Patient has been started on keppra per neurology instead of dilatin. Will repeat Dilantin level this morning. Neurology ff and appreciate input and rxcs. Continue seizure precautions.  #2 phenytoin toxicity Patient with no further seizures. phenytoin level elevated yesterday repeat pending. Phenytoin is on hold.  #3 tobacco abuse Continue nicotine patch. Patient has been counseled on tobacco cessation.  #4 COPD stable  #5 nonischemic cardiomyopathy Stable. Continue lisinopril, Lopressor, Zocor, aspirin.  #6 prophylaxis SCDs for DVT prophylaxis.  Code Status: Full Family Communication: Updated patient. No family at bedside. Disposition Plan: SNF vs  Home with home healthwhen medically stable   Brief narrative: 56 year old woman presented to ED with complaint of seizure earlier 9/28 around 2 pm. Was at mother's house when she started to "feel bad" and subsequently had seizure. Workup in the ED was notable for Dilantin toxicity, mild ataxia. There was concern over timely outpatient follow-up and monitoring and patient was referred for admission.  Patient has felt generally poor last few days but no specific localizing symptoms. She has a history of a seizure disorder s/p meningitis 2009 with prolonged hospitalization and placement of VP shunt. She was seen in ED 9/10 for seizure, workup including basic  labs and shunt series were unremarkable with the exception of mildly elevated Dilantin level of 22.1. According to patient she was told to decrease Dilantin dose which she did for a "few days" but then had a seizure and so resumed chronic dosing of 200 mg BID.  She has a history of alcohol use (3 beers/day) but reports last drink was 9/26).  History supplemented by sister Elease Hashimoto at bedside who reports patient appears to be at baseline. Patient does have short-term memory loss and it is not clear that she takes her medication as directed.   Consultants:  Neurology: Dr Eilleen Kempf 01/26/12  Procedures:  CT head 01/26/12  Antibiotics:  None  HPI/Subjective: Patient denies any seizures overnight.   Objective: Filed Vitals:   01/27/12 0554 01/27/12 0830 01/27/12 1150 01/27/12 1437  BP: 120/69  130/80 125/79  Pulse: 90  88 76  Temp: 97.8 F (36.6 C)   97.4 F (36.3 C)  TempSrc: Oral   Oral  Resp: 18   18  Height:      Weight:      SpO2: 96% 92%  98%    Intake/Output Summary (Last 24 hours) at 01/27/12 1448 Last data filed at 01/27/12 1300  Gross per 24 hour  Intake    585 ml  Output    900 ml  Net   -315 ml   Filed Weights   01/25/12 2237  Weight: 50.9 kg (112 lb 3.4 oz)    Exam:   General:  NAD  Cardiovascular: RRR  Respiratory: CTAB  Abdomen: Soft/NT/ND/+BS  Data Reviewed: Basic Metabolic Panel:  Lab 01/27/12 9562 01/26/12 1058 01/26/12 0722 01/25/12 1513  NA 135 -- 135 137  K 4.1 -- 4.3 4.1  CL 98 -- 102 102  CO2 27 -- 23 26  GLUCOSE 89 -- 80 109*  BUN 13 -- 10 12  CREATININE 0.53 -- 0.41* 0.67  CALCIUM 9.1 -- 8.8 8.5  MG -- 1.7 -- --  PHOS -- -- -- --   Liver Function Tests:  Lab 01/26/12 0722  AST 55*  ALT 64*  ALKPHOS 122*  BILITOT 0.4  PROT 6.4  ALBUMIN 3.2*   No results found for this basename: LIPASE:5,AMYLASE:5 in the last 168 hours No results found for this basename: AMMONIA:5 in the last 168 hours CBC:  Lab 01/27/12 0535 01/25/12  1513  WBC 6.6 6.6  NEUTROABS -- 3.9  HGB 15.6* 13.4  HCT 46.9* 39.7  MCV 101.5* 103.1*  PLT 145* 146*   Cardiac Enzymes:  Lab 01/26/12 1640  CKTOTAL 52  CKMB --  CKMBINDEX --  TROPONINI --   BNP (last 3 results) No results found for this basename: PROBNP:3 in the last 8760 hours CBG:  Lab 01/25/12 1502  GLUCAP 108*    Recent Results (from the past 240 hour(s))  MRSA PCR SCREENING     Status: Abnormal   Collection Time   01/26/12  5:45 AM      Component Value Range Status Comment   MRSA by PCR INVALID RESULTS, SPECIMEN SENT FOR CULTURE (*) NEGATIVE Final   MRSA CULTURE     Status: Normal (Preliminary result)   Collection Time   01/26/12  5:45 AM      Component Value Range Status Comment   Specimen Description NOSE   Final    Special Requests NONE   Final    Culture NO SUSPICIOUS COLONIES, CONTINUING TO HOLD   Final    Report Status PENDING   Incomplete   MRSA PCR SCREENING     Status: Normal   Collection Time   01/26/12 10:12 AM      Component Value Range Status Comment   MRSA by PCR NEGATIVE  NEGATIVE Final      Studies: Ct Head Wo Contrast  01/26/2012  *RADIOLOGY REPORT*  Clinical Data: Seizure workup.  Status post meningitis.  CT HEAD WITHOUT CONTRAST  Technique:  Contiguous axial images were obtained from the base of the skull through the vertex without contrast.  Comparison: Most recent MR head 06/03/2011.  Most recent CT 06/03/2011.  Findings: The patient has undergone ventriculoperitoneal shunting from a right frontal approach.  The right lateral ventricle and third ventricle are decompressed.  The left ventricle remains mildly dilated, similar to priors, most notable in the left temporal horn.  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 Chronic changes of dystrophic calcification in the left frontal lobe.  No acute features are identified such as increased edema, mass effect, or cortical hypodensity. No acute intracranial  hemorrhage.  No extra-axial fluid collection.  Calvarium intact except for previous craniotomy. Clear sinuses and mastoids.  IMPRESSION: Stable exam.  Ventricles remain well decompressed on the right with chronic left ventriculomegaly.  Possible trapped left lateral ventricle without interval increase in size.  Right frontal encephalomalacia is stable.  No signs of acute or developing infection.   Original Report Authenticated By: Elsie Stain, M.D.     Scheduled Meds:    . aspirin  81 mg Oral Daily  . Fluticasone-Salmeterol  1 puff Inhalation Q12H  . folic acid  1 mg Oral Daily  . levetiracetam  500 mg Intravenous BID  . lisinopril  10 mg Oral Daily  . metoprolol  50 mg Oral  BID  . multivitamin with minerals  1 tablet Oral Daily  . nicotine  21 mg Transdermal Once  . nicotine  21 mg Transdermal Daily  . simvastatin  20 mg Oral q1800  . sodium chloride  3 mL Intravenous Q12H  . sodium chloride  3 mL Intravenous Q12H  . thiamine  100 mg Oral Daily  . tiotropium  18 mcg Inhalation Daily   Continuous Infusions:   Principal Problem:  *Phenytoin toxicity Active Problems:  COPD (chronic obstructive pulmonary disease)  Tobacco user  Alcohol abuse  Seizure disorder  Non-ischemic cardiomyopathy    Time spent: 60 mins    [facility]  [facility] Pager [phone]. If 8PM-8AM, please contact night-coverage at [email or web address], password [facility] 01/27/2012, 2:48 PM  LOS: 2 days

## 2012-01-27 NOTE — Progress Notes (Signed)
[facility] PROGRESS NOTE    SUBJECTIVE   Patient has no complaints.  She is speaking about a "memory clinic she is looking forward to going to".  She is aware she is in the hospital but unable to tell me correct year or month.   OBJECTIVE   Vital signs in last 24 hours: Temp:  [97.4 F (36.3 C)-97.8 F (36.6 C)] 97.4 F (36.3 C) (09/30 1437) Pulse Rate:  [76-90] 76  (09/30 1437) Resp:  [18-20] 18  (09/30 1437) BP: (113-130)/(69-80) 125/79 mmHg (09/30 1437) SpO2:  [92 %-98 %] 98 % (09/30 1437)  Intake/Output from previous day: 09/29 0701 - 09/30 0700 In: 105 [IV Piggyback:105] Out: 800 [Urine:800] Intake/Output this shift: Total I/O In: 480 [P.O.:480] Out: 400 [Urine:400] Nutritional status: Cardiac  Past Medical History  Diagnosis Date  . Meningitis 2009  . Hx of ventricular shunt 2009  . Seizure disorder   . 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 Suicide attempt     History of suicide attempts August 2006 and July 2006  . History of alcohol abuse   . History of cocaine abuse   . Major depressive disorder     History of  . History of iron deficiency   . COPD (chronic obstructive pulmonary disease)   . History of acute alcoholic hepatitis   . Psychiatric diagnosis     Multiple  . 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 Left breast mass     Neurologic ROS negative with exception of above. Musculoskeletal ROS none  Neurologic Exam:   Mental Status: Alert, not oriented to month or year, thought content inappropriate -talking about a memory clinic.  Speech fluent without evidence of aphasia.  Able to follow 3 step commands without difficulty. Cranial Nerves: II: Visual fields grossly normal, pupils equal, round, reactive to light and  accommodation III,IV, VI: ptosis present left eye, extra-ocular motions intact bilaterally V,VII: smile symmetric, facial light touch sensation normal bilaterally VIII: hearing normal bilaterally IX,X: gag reflex present XI: bilateral shoulder shrug XII: midline tongue extension Motor: Right : Upper extremity   4/5    Left:     Upper extremity   4/5  Lower extremity   5/5     Lower extremity   5/5 Tone and bulk:normal tone throughout; no atrophy noted Sensory: Pinprick and light touch intact throughout, bilaterally Deep Tendon Reflexes: 2+ and symmetric throughout Plantars: Right: downgoing   Left: downgoing Cerebellar: normal finger-to-nose,  normal heel-to-shin test CV: pulses palpable throughout    Lab Results: Lab Results  Component Value Date/Time   CHOL 214* 11/03/2008  8:17 PM   Lipid Panel No results found for this basename: CHOL,TRIG,HDL,CHOLHDL,VLDL,LDLCALC in the last 72 hours  Studies/Results: Ct Head Wo Contrast  01/26/2012  *RADIOLOGY REPORT*  Clinical Data: Seizure workup.  Status post meningitis.  CT HEAD WITHOUT CONTRAST  Technique:  Contiguous axial images were obtained from the base of the skull through the vertex without contrast.  Comparison: Most recent MR head 06/03/2011.  Most recent CT 06/03/2011.  Findings: The patient has undergone ventriculoperitoneal shunting from a right  frontal approach.  The right lateral ventricle and third ventricle are decompressed.  The left ventricle remains mildly dilated, similar to priors, most notable in the left temporal horn.  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 Chronic changes of dystrophic calcification in the left frontal lobe.  No acute features are identified such as increased edema, mass effect, or cortical hypodensity. No acute intracranial hemorrhage.  No extra-axial fluid collection.  Calvarium intact except for previous craniotomy. Clear sinuses and mastoids.  IMPRESSION:  Stable exam.  Ventricles remain well decompressed on the right with chronic left ventriculomegaly.  Possible trapped left lateral ventricle without interval increase in size.  Right frontal encephalomalacia is stable.  No signs of acute or developing infection.   Original Report Authenticated By: Elsie Stain, M.D.     Medications:     Scheduled:   . aspirin  81 mg Oral Daily  . Fluticasone-Salmeterol  1 puff Inhalation Q12H  . folic acid  1 mg Oral Daily  . levETIRAcetam  500 mg Oral BID  . lisinopril  10 mg Oral Daily  . magnesium sulfate 1 - 4 g bolus IVPB  2 g Intravenous Once  . metoprolol  50 mg Oral BID  . multivitamin with minerals  1 tablet Oral Daily  . nicotine  21 mg Transdermal Once  . nicotine  21 mg Transdermal Daily  . simvastatin  20 mg Oral q1800  . sodium chloride  3 mL Intravenous Q12H  . sodium chloride  3 mL Intravenous Q12H  . thiamine  100 mg Oral Daily  . tiotropium  18 mcg Inhalation Daily  . DISCONTD: levetiracetam  500 mg Intravenous BID    Assessment/Plan:   Elevated Dilantin Levels Patient has significant complications from Dilantin, and refusing to quit alcohol  Since she is already on therapeutic Dilantin. We can switch her to Keppra that would not interfere with alcohol   Current Dilantin level PENDING  Recommendations: Continue Keppra 500 mg po bid and d/c dilantin  No Further Neurological recommendations.     Felicie Morn PA-C [facility] [phone]  01/27/2012, 4:10 PM

## 2012-01-28 DIAGNOSIS — F1010 Alcohol abuse, uncomplicated: Secondary | ICD-10-CM

## 2012-01-28 LAB — MAGNESIUM: Magnesium: 2 mg/dL (ref 1.5–2.5)

## 2012-01-28 LAB — PHENYTOIN LEVEL, TOTAL: Phenytoin Lvl: 29 ug/mL — ABNORMAL HIGH (ref 10.0–20.0)

## 2012-01-28 LAB — MRSA CULTURE

## 2012-01-28 LAB — BASIC METABOLIC PANEL
BUN: 13 mg/dL (ref 6–23)
CO2: 26 mEq/L (ref 19–32)
Calcium: 9 mg/dL (ref 8.4–10.5)
Chloride: 100 mEq/L (ref 96–112)
Creatinine, Ser: 0.51 mg/dL (ref 0.50–1.10)
GFR calc Af Amer: >90 mL/min (ref >90)
GFR calc non Af Amer: >90 mL/min (ref >90)
Glucose, Bld: 89 mg/dL (ref 70–99)
Potassium: 4.3 mEq/L (ref 3.5–5.1)
Sodium: 136 mEq/L (ref 135–145)

## 2012-01-28 MED ORDER — B COMPLEX PO TABS: 1.0000 | ORAL_TABLET | Freq: Two times a day (BID) | ORAL | Status: AC

## 2012-01-28 MED ORDER — LEVETIRACETAM 500 MG PO TABS: 500.0000 mg | ORAL_TABLET | Freq: Two times a day (BID) | ORAL | Status: AC

## 2012-01-28 MED ORDER — NICOTINE 21 MG/24HR TD PT24: 1.0000 | MEDICATED_PATCH | Freq: Every day | TRANSDERMAL | Status: DC

## 2012-01-28 NOTE — Progress Notes (Signed)
Physical Therapy Treatment Patient Details Name: Wanda Prince MRN: [id number] DOB: 01-25-1956 Today's Date: 01/28/2012 Time: 1000-1024 PT Time Calculation (min): 24 min  PT Assessment / Plan / Recommendation Comments on Treatment Session  Pt states she plans to stay with her mom for a while until she gets "back on my feet". Amb pt on RA sats 90 - 92%. Assisted pt OOB to bathroom then amb in hallway using RW due to instability and tromors throoughout.    Follow Up Recommendations    Pt states she is going to stay with her mom for a while   Barriers to Discharge        Equipment Recommendations    has a RW   Recommendations for Other Services    Frequency Min 3X/week   Plan Discharge plan remains appropriate    Precautions / Restrictions Precautions Precautions: Fall Restrictions Weight Bearing Restrictions: No    Pertinent Vitals/Pain No c/o pain    Mobility  Bed Mobility Bed Mobility: Supine to Sit;Sitting - Scoot to Edge of Bed Supine to Sit: 6: Modified independent (Device/Increase time) Sitting - Scoot to Edge of Bed: 6: Modified independent (Device/Increase time)  Transfers Transfers: Sit to Stand;Stand to Sit Sit to Stand: 5: Supervision;From bed;From toilet Stand to Sit: 5: Supervision;To bed;To toilet Details for Transfer Assistance: <25% VC's for safety using RW with turns  Ambulation/Gait Ambulation/Gait Assistance: 4: Min guard Ambulation Distance (Feet): 125 Feet Assistive device: Rolling walker Ambulation/Gait Assistance Details: <25% VC's on safety with turns using RW and negociating around obsticles. Very shaky throughout/tremors Gait Pattern: Step-through pattern;Right foot flat;Left foot flat;Ataxic Gait velocity: decreased     PT Goals      progressing    Visit Information  Last PT Received On: 01/28/12    Subjective Data      Cognition       Balance     End of Session PT - End of Session Equipment Utilized During Treatment: Gait  belt Activity Tolerance: Patient tolerated treatment well Patient left: in bed;with call bell/phone within reach  Felecia Shelling  PTA [facility]  Acute  Rehab Pager     [phone]

## 2012-01-28 NOTE — Progress Notes (Signed)
Talked to patient with sister  Peggie  Present; PCP is Dr Maryelizabeth Rowan; [facility] choices offered, patient and sister chose [facility]; Olene Craven RN with [facility] contacted for arrangement. Abelino Derrick RN,BSN,MHA

## 2012-01-28 NOTE — Progress Notes (Signed)
Clinical Social Work Department BRIEF PSYCHOSOCIAL ASSESSMENT 01/28/2012  Patient:  KAYSIE, MICHELINI     Account Number:  [id number]     Admit date:  01/25/2012  Clinical Social Worker:  Orpah Greek  Date/Time:  01/28/2012 09:14 AM  Referred by:  Physician  Date Referred:  01/27/2012 Referred for  SNF Placement   Other Referral:   Interview type:  Patient Other interview type:   and sister, Peggy Way    PSYCHOSOCIAL DATA Living Status:  ALONE Admitted from facility:   Level of care:   Primary support name:  Selinda Flavin (sister) h#: [phone] c#: [phone] Primary support relationship to patient:  SIBLING Degree of support available:   good    CURRENT CONCERNS Current Concerns  Post-Acute Placement   Other Concerns:    SOCIAL WORK ASSESSMENT / PLAN CSW spoke with patient & patient's sister, Gigi Gin via cell (#: [phone]) re: discharge planning. Patient states that she would be agreeable to SNF if patient's sister & mother (who is patient's legal guardian) checks out the facility and is agreeable.   Assessment/plan status:  Information/Referral to [facility] Other assessment/ plan:   Information/referral to community resources:   CSW completed FL2 and faxed information out to [facility] - patient has 1 bed offer, 1 facility in [facility] that accepts Medicaid as primary insurance - [place].    PATIENT'S/FAMILY'S RESPONSE TO PLAN OF CARE: Patient's sister, Gigi Gin states that she has looked up [place] and drove by it last night - would like to hold off on patient going there for now, would rather have patient come home with their mother Dewayne Hatch Gleason ([address] [place]/ ph#: [phone]) and have home health come out, then if from home, they feel that patient would do better at a SNF, take her from there. CSW assured sister that it would be possible for patient to go to SNF from home if it's within a 30 day window of being discharged from the  hospital. Patient & sister are agreeable with this plan. RNCM, Steward Drone made aware & will speak with sister to arrange HHC. Anticipating discharge today.        Unice Bailey, LCSW [facility] Clinical Social Worker cell #: [phone]

## 2012-01-28 NOTE — Discharge Summary (Signed)
Physician Discharge Summary  Wanda Prince:[id number] DOB: [birth year] DOA: 01/25/2012  PCP: Pcp Not In System  Admit date: 01/25/2012 Discharge date: 01/28/2012  Recommendations for Outpatient Follow-up:  1. Patient is to followup with PCP one week post discharge. Patient will need a basic metabolic profile checked to followup on electrolytes. Patient will also likely need a referral to see a neurologist for management of her seizure disorder.  Discharge Diagnoses:  Principal Problem:  *Phenytoin toxicity Active Problems:  COPD (chronic obstructive pulmonary disease)  Tobacco user  Alcohol abuse  Seizure disorder  Non-ischemic cardiomyopathy   Discharge Condition: Stable and improved  Diet recommendation: Regular  Filed Weights   01/25/12 2237  Weight: 50.9 kg (112 lb 3.4 oz)    History of present illness:  56 year old woman presented to ED with complaint of seizure earlier 9/28 around 2 pm. Was at mother's house when she started to "feel bad" and subsequently had seizure. Workup in the ED was notable for Dilantin toxicity, mild ataxia. There was concern over timely outpatient follow-up and monitoring and patient was referred for admission.  Patient has felt generally poor last few days but no specific localizing symptoms. She has a history of a seizure disorder s/p meningitis 2009 with prolonged hospitalization and placement of VP shunt. She was seen in ED 9/10 for seizure, workup including basic labs and shunt series were unremarkable with the exception of mildly elevated Dilantin level of 22.1. According to patient she was told to decrease Dilantin dose which she did for a "few days" but then had a seizure and so resumed chronic dosing of 200 mg BID.  She has a history of alcohol use (3 beers/day) but reports last drink was 9/26).  History supplemented by sister Elease Hashimoto at bedside who reports patient appears to be at baseline. Patient does have short-term memory loss and it is  not clear that she takes her medication as directed.      Hospital Course:  #1 seizures Patient was admitted with seizures which was felt to be secondary to Dilantin toxicity. Patient Dilantin level on admission was 40.2. Patient was admitted to the telemetry floor and placed under seizure precautions. CT of the head was obtained which was negative for any acute abnormalities. A lactic acid level was also obtained which came back at 1.7. A B12 level came back at 491 a RPR level which was obtained came back nonreactive. Total CK came back at 52. Patient was monitored and her Dilantin was discontinued. Patient did not have any further seizures during the hospitalization. A neurology consultation was obtained and patient was seen in consultation by Dr. Eilleen Kempf who felt that patient has had significant complications from Dilantin and patient was refusing to quit alcohol. It was felt that since patient was therapeutic on Dilantin and was recommended to discontinue patient Dilantin and started on Keppra 500 mg twice daily. Patient was initially placed on IV Keppra which she tolerated and subsequently transitioned to oral. Patient will be discharged home on oral Keppra 500 mg twice daily. Patient is to followup with PCP one week post discharge and PCP may consider referring patient to a neurologist for further management.  #2 Dilantin toxicity Patient was admitted with seizures which was felt to be secondary to Dilantin toxicity. Patient Dilantin level on admission was 40.2. Patient did not have any nystagmus no cerebellar signs no nausea no vomiting no dizziness no hyperreflexia no gingival hyperplasia nor have loss. Patient Dilantin was discontinued and Dilantin levels were checked.  Patient's Dilantin level initially went up to 50.8 and started to trend back down down to 29 on day of discharge. Patient did not have any further seizures during the hospitalization her Dilantin has been discontinued and she was  discharged in stable and improved condition.  #3 gait disturbance Patient was noted to have a gait disturbance and was seen by physical therapy and skilled nursing facility was recommended. Head CT which was done was negative. Patient was seen by neurology and it was felt that patient's gait disturbance was likely multifactorial secondary to muscle weakness, right frontal and simple lobe malacia, left ventricular dilatation dilation, alcohol and muscle atrophy in addition to nutritional status. Skilled nursing facility was recommended however patient's sister of his to take patient home with home health therapy after visiting the skilled nursing facility. Patient be discharged home with home health PT and OT.  The rest of patient's chronic medical issues remained stable throughout the hospitalization and patient be discharged in stable and improved condition.  Procedures:  CT head 01/26/2012  Consultations:  Neurology: Dr Eilleen Kempf 01/26/2012  Discharge Exam: Filed Vitals:   01/27/12 2100 01/28/12 0448 01/28/12 0916 01/28/12 1028  BP: 107/70 122/76  130/72  Pulse: 88 86    Temp: 98.3 F (36.8 C) 97.8 F (36.6 C)    TempSrc: Oral Oral    Resp: 20 20    Height:      Weight:      SpO2: 95% 96% 87%     General: NAD Cardiovascular: RRR Respiratory: CTAB  Discharge Instructions  Discharge Orders    Future Orders Please Complete By Expires   Diet general      Increase activity slowly      Discharge instructions      Comments:   Follow up PCP in 1 week.       Medication List     As of 01/28/2012 11:06 AM    TAKE these medications         albuterol 108 (90 BASE) MCG/ACT inhaler   Commonly known as: PROVENTIL HFA;VENTOLIN HFA   Inhale 1 puff into the lungs every 6 (six) hours as needed. For shortness of breath.      aspirin 81 MG chewable tablet   Chew 1 tablet (81 mg total) by mouth daily.      DIASTAT ACUDIAL 10 MG Gel   Generic drug: diazepam   Place 10 mg rectally  as needed. For seizures.      Fluticasone-Salmeterol 250-50 MCG/DOSE Aepb   Commonly known as: ADVAIR   Inhale 1 puff into the lungs every 12 (twelve) hours.      folic acid 1 MG tablet   Commonly known as: FOLVITE   TAKE 1 TABLET EACH DAY      levETIRAcetam 500 MG tablet   Commonly known as: KEPPRA   Take 1 tablet (500 mg total) by mouth 2 (two) times daily.      lisinopril 10 MG tablet   Commonly known as: PRINIVIL,ZESTRIL   Take 1 tablet (10 mg total) by mouth daily.      metoprolol 50 MG tablet   Commonly known as: LOPRESSOR   Take 1 tablet (50 mg total) by mouth 2 (two) times daily.      multivitamin with minerals Tabs   Take 1 tablet by mouth daily.      nicotine 21 mg/24hr patch   Commonly known as: NICODERM CQ - dosed in mg/24 hours   Place 1 patch onto the  skin daily.      phenytoin 100 MG ER capsule   Commonly known as: DILANTIN   Take 2 capsules (200 mg total) by mouth 2 (two) times daily.      simvastatin 20 MG tablet   Commonly known as: ZOCOR   Take 1 tablet (20 mg total) by mouth daily at 6 PM.      thiamine 100 MG tablet   Take 1 tablet (100 mg total) by mouth daily.      tiotropium 18 MCG inhalation capsule   Commonly known as: SPIRIVA   Place 1 capsule (18 mcg total) into inhaler and inhale daily.      Vitamin D (Ergocalciferol) 50000 UNITS Caps   Commonly known as: DRISDOL   Take 50,000 Units by mouth every 7 (seven) days.           Follow-up Information    Schedule an appointment as soon as possible for a visit in 1 week to follow up. (f/u with MD in 1 week)           The results of significant diagnostics from this hospitalization (including imaging, microbiology, ancillary and laboratory) are listed below for reference.    Significant Diagnostic Studies: Dg Skull Complete  01/07/2012  *RADIOLOGY REPORT*  Clinical Data: Recent seizure.  Severe headache.  SKULL - COMPLETE 4 + VIEW  Comparison: Head CT dated 06/03/2011.  Findings: The  right frontal ventriculoperitoneal shunt catheter tubing appears intact. Right frontal post craniotomy changes are again demonstrated.  IMPRESSION: No acute abnormality.   Original Report Authenticated By: Darrol Angel, M.D.    Dg Chest 1 View  01/07/2012  *RADIOLOGY REPORT*  Clinical Data: Seizure.  Check shunt.  CHEST - 1 VIEW  Comparison: None.  Findings: The visualized portions of the shunt tubing are intact. Remote bilateral rib fractures are again seen.  Chronic interstitial coarsening is stable.  IMPRESSION:  1.  The ventriculostomy shunt tubing is intact. 2.  No acute cardiopulmonary disease.   Original Report Authenticated By: Jamesetta Orleans. MATTERN, M.D.    Dg Abd 1 View  01/07/2012  *RADIOLOGY REPORT*  Clinical Data: Recent seizure.  Evaluate shunt tubing.  ABDOMEN - 1 VIEW  Comparison: Single view abdomen 01/11/2010.  Findings: The most left lateral aspect of the shunt tubing is not included on this view.  The tube is otherwise intact and extends in the abdomen.  Bowel gas pattern is unremarkable.  Degenerative changes are noted in the lower lumbar spine.  IMPRESSION:  1.  The visualized shunt tubing is intact. 2.  A small portion of the most lateral aspect of the tubing is not visualized. 3.  Normal bowel gas pattern.   Original Report Authenticated By: Jamesetta Orleans. MATTERN, M.D.    Ct Head Wo Contrast  01/26/2012  *RADIOLOGY REPORT*  Clinical Data: Seizure workup.  Status post meningitis.  CT HEAD WITHOUT CONTRAST  Technique:  Contiguous axial images were obtained from the base of the skull through the vertex without contrast.  Comparison: Most recent MR head 06/03/2011.  Most recent CT 06/03/2011.  Findings: The patient has undergone ventriculoperitoneal shunting from a right frontal approach.  The right lateral ventricle and third ventricle are decompressed.  The left ventricle remains mildly dilated, similar to priors, most notable in the left temporal horn.  Right frontal  encephalomalacia relates to previous infection with subsequent surgery. Chronic changes of dystrophic calcification in the left frontal lobe.  No acute features are identified such as increased edema,  mass effect, or cortical hypodensity. No acute intracranial hemorrhage.  No extra-axial fluid collection.  Calvarium intact except for previous craniotomy. Clear sinuses and mastoids.  IMPRESSION: Stable exam.  Ventricles remain well decompressed on the right with chronic left ventriculomegaly.  Possible trapped left lateral ventricle without interval increase in size.  Right frontal encephalomalacia is stable.  No signs of acute or developing infection.   Original Report Authenticated By: Elsie Stain, M.D.     Microbiology: Recent Results (from the past 240 hour(s))  MRSA PCR SCREENING     Status: Abnormal   Collection Time   01/26/12  5:45 AM      Component Value Range Status Comment   MRSA by PCR INVALID RESULTS, SPECIMEN SENT FOR CULTURE (*) NEGATIVE Final   MRSA CULTURE     Status: Normal   Collection Time   01/26/12  5:45 AM      Component Value Range Status Comment   Specimen Description NOSE   Final    Special Requests NONE   Final    Culture     Final    Value: NO STAPHYLOCOCCUS AUREUS ISOLATED     Note: NOMRSA   Report Status 01/28/2012 FINAL   Final   MRSA PCR SCREENING     Status: Normal   Collection Time   01/26/12 10:12 AM      Component Value Range Status Comment   MRSA by PCR NEGATIVE  NEGATIVE Final      Labs: Basic Metabolic Panel:  Lab 01/28/12 8119 01/27/12 0400 01/26/12 1058 01/26/12 0722 01/25/12 1513  NA 136 135 -- 135 137  K 4.3 4.1 -- 4.3 4.1  CL 100 98 -- 102 102  CO2 26 27 -- 23 26  GLUCOSE 89 89 -- 80 109*  BUN 13 13 -- 10 12  CREATININE 0.51 0.53 -- 0.41* 0.67  CALCIUM 9.0 9.1 -- 8.8 8.5  MG 2.0 -- 1.7 -- --  PHOS -- -- -- -- --   Liver Function Tests:  Lab 01/26/12 0722  AST 55*  ALT 64*  ALKPHOS 122*  BILITOT 0.4  PROT 6.4  ALBUMIN 3.2*    No results found for this basename: LIPASE:5,AMYLASE:5 in the last 168 hours No results found for this basename: AMMONIA:5 in the last 168 hours CBC:  Lab 01/27/12 0535 01/25/12 1513  WBC 6.6 6.6  NEUTROABS -- 3.9  HGB 15.6* 13.4  HCT 46.9* 39.7  MCV 101.5* 103.1*  PLT 145* 146*   Cardiac Enzymes:  Lab 01/26/12 1640  CKTOTAL 52  CKMB --  CKMBINDEX --  TROPONINI --   BNP: BNP (last 3 results) No results found for this basename: PROBNP:3 in the last 8760 hours CBG:  Lab 01/25/12 1502  GLUCAP 108*    Time coordinating discharge: 60 minutes  Signed:  Frankey Botting  [facility] 01/28/2012, 11:06 AM

## 2012-02-03 NOTE — Care Management Note (Addendum)
    Page 1 of 2   02/03/2012     3:34:57 PM   CARE MANAGEMENT NOTE 02/03/2012  Patient:  Wanda Prince, Wanda Prince   Account Number:  [id number]  Date Initiated:  01/28/2012  Documentation initiated by:  Jiles Crocker  Subjective/Objective Assessment:   ADMITTED WITH SEIZURE DISORDER     Action/Plan:   PCP IS DR Duanne Guess; LIVES ALONE   Anticipated DC Date:  01/28/2012   Anticipated DC Plan:  HOME W HOME HEALTH SERVICES         Choice offered to / List presented to:  C-1 Patient        HH arranged  HH-1 RN  HH-2 PT  HH-3 OT  HH-4 NURSE'S AIDE  HH-6 SOCIAL WORKER      HH agency  [facility]   Status of service:  Completed, signed off Medicare Important Message given?   (If response is "NO", the following Medicare IM given date fields will be blank) Date Medicare IM given:   Date Additional Medicare IM given:    Discharge Disposition:  HOME W HOME HEALTH SERVICES  Per UR Regulation:  Reviewed for med. necessity/level of care/duration of stay  If discussed at Long Length of Stay Meetings, dates discussed:    Comments:  02/03/12 Vera Wishart RN,BSN NCM [phone] PATIENT WAS ORDERED FOR HHRN/PT/OT,BUT PER INSURANCE WOULD NEED A REHAB DX,THEREFORE NOT A COVERED BENEFIT FOR HHPT/OT,EVEN THOUGH IT WAS ORDERED. RECEIVED CALL FROM SISTER OF PATIENT PEGGY [phone] ABOUT [facility] WANTING PT/OT.UPON REVIEW & TALKING TO [facility]ST[facility].PATIENT WAS ORDERED FOR HHRN.PT/OT-NOT A COVERED BENEFIT WITHOUT A REHAB DX.SISTER ALSO WANTED THE PCP APPT EARLIER THAN 02/25/12. I HAVE ASKED [facility] TO HAVE HHRN TO ATTEMPT TO GET NEW PCP APPT EARLIER.INITIALLY PATIENT WAS RECOMMENDED FOR SNF,BUT FAMILY DECLINED,& WANTED HHC.EXPLAINED MEDICAID REGULATION OF NEEDING A QUALIFYING DX.SISTER WAS GRATEFUL FOR MY ASSISTANCE,& [facility] WILL F/U ALSO.  Cherrie Distance, RN Case Manager Signed CASE MANAGEMENT Progress Notes 01/28/2012 12:21 PM Talked to patient with sister  Peggie   Present; PCP is Dr Maryelizabeth Rowan; [facility] choices offered, patient and sister chose [facility]; Olene Craven RN with [facility] contacted for arrangement. Abelino Derrick RN,BSN,MHA

## 2012-02-10 DIAGNOSIS — E785 Hyperlipidemia, unspecified: Secondary | ICD-10-CM | POA: Insufficient documentation

## 2012-02-10 DIAGNOSIS — Z9889 Other specified postprocedural states: Secondary | ICD-10-CM | POA: Insufficient documentation

## 2012-02-12 DIAGNOSIS — B182 Chronic viral hepatitis C: Secondary | ICD-10-CM | POA: Diagnosis present

## 2012-02-27 IMAGING — MR MR HEAD W/O CM
2 series · 48 of 48 positions shown · non-contrast
Comparison: CT head 06/03/2011

CLINICAL DATA: Right-sided neglect.  Rule out CVA.

MRI HEAD WITHOUT CONTRAST
TECHNIQUE: Multiplanar, multiecho pulse sequences of the brain and
surrounding structures were obtained according to standard protocol
without intravenous contrast.

[Series 3: DWI · axial · 5.0mm · 1.09mm/px · z∈[-61,+74]mm · 32 of 52 slices shown (1 of 2)]
[im 1/52]
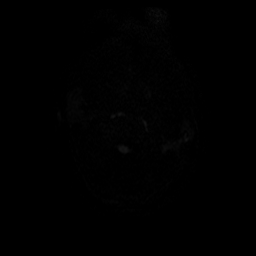
[im 2/52]
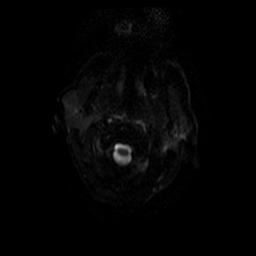
[im 4/52]
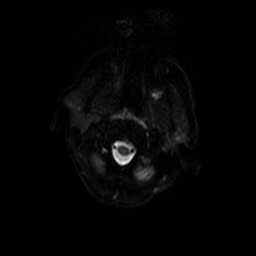
[im 5/52]
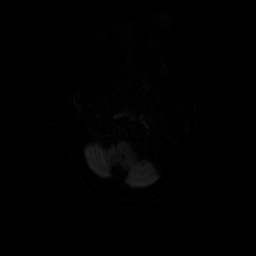
[im 7/52]
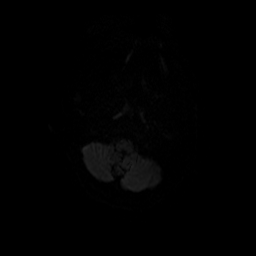
[im 9/52]
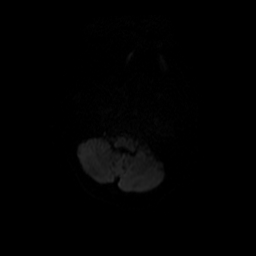
[im 10/52]
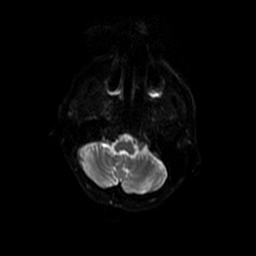
[im 12/52]
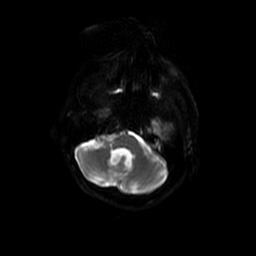
[im 14/52]
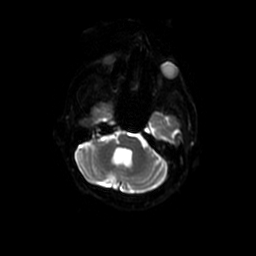
[im 15/52]
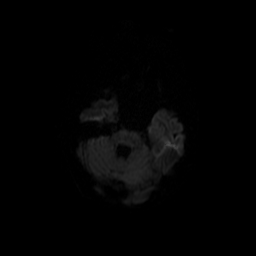
[im 17/52]
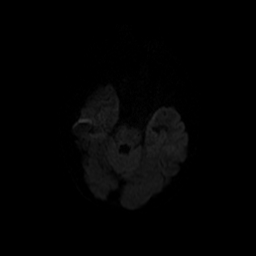
[im 19/52]
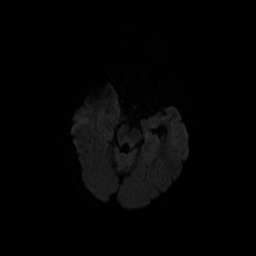
[im 20/52]
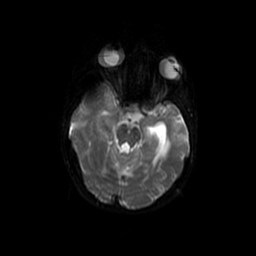
[im 22/52]
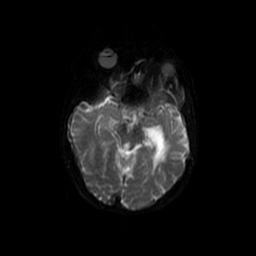
[im 24/52]
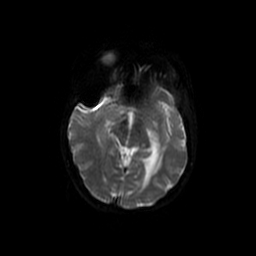
[im 25/52]
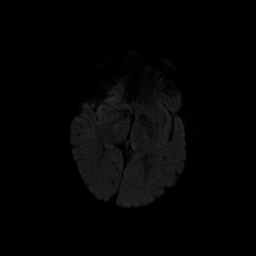
[im 27/52]
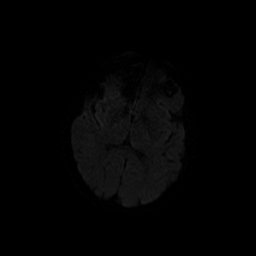
[im 28/52]
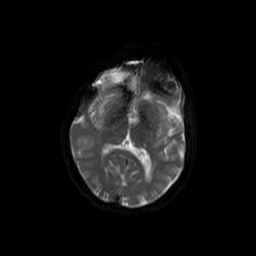
[im 30/52]
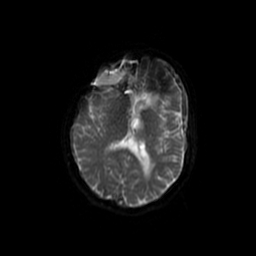
[im 32/52]
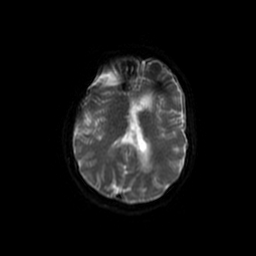
[im 33/52]
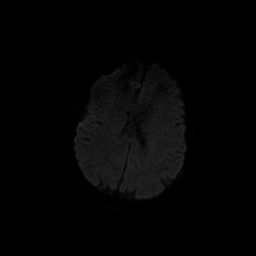
[im 35/52]
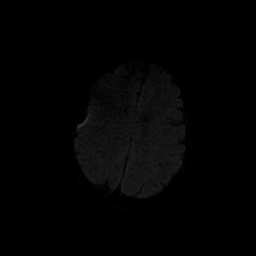
[im 37/52]
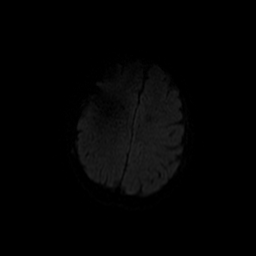
[im 38/52]
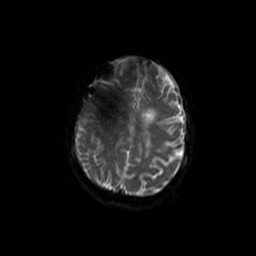
[im 40/52]
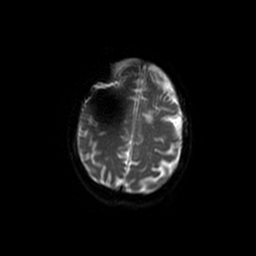
[im 42/52]
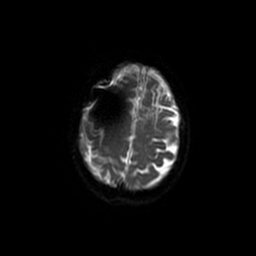
[im 43/52]
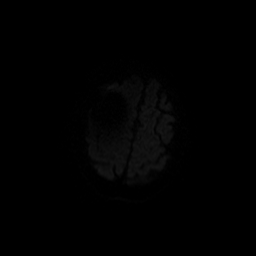
[im 45/52]
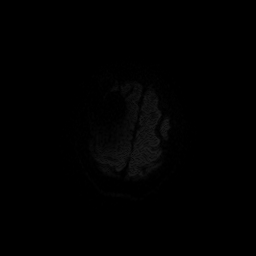
[im 47/52]
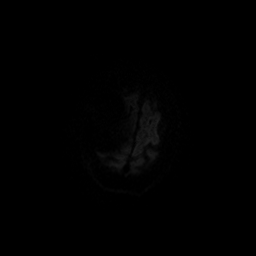
[im 48/52]
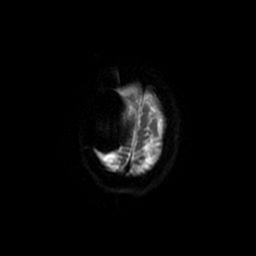
[im 50/52]
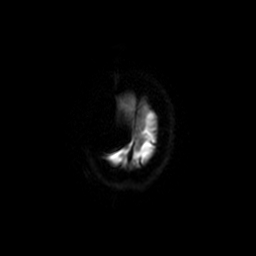
[im 52/52]
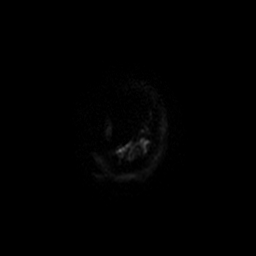

[Series 300: DWI · axial · 5.0mm · 1.09mm/px · z∈[-61,+74]mm · 16 of 26 slices shown (2 of 2)]
[im 1/26]
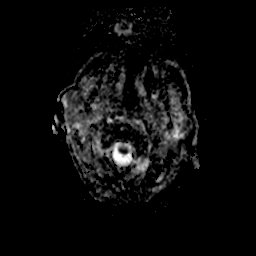
[im 2/26]
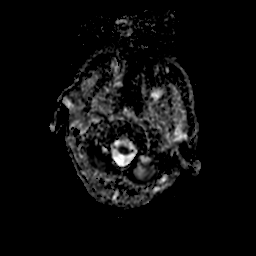
[im 4/26]
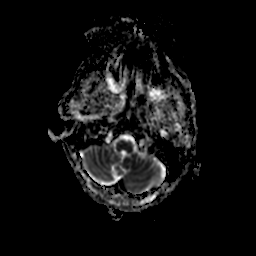
[im 6/26]
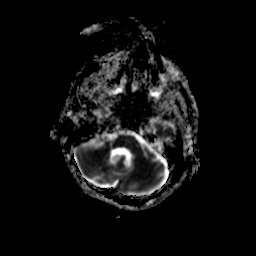
[im 7/26]
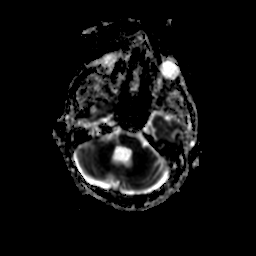
[im 9/26]
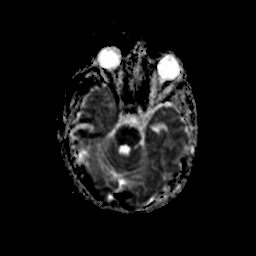
[im 11/26]
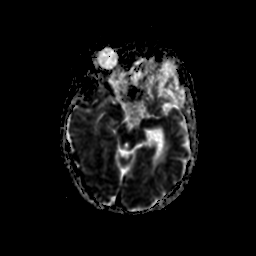
[im 12/26]
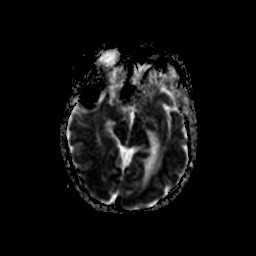
[im 14/26]
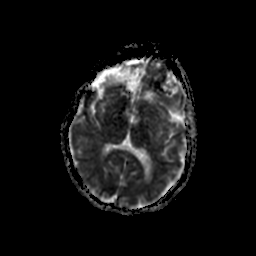
[im 16/26]
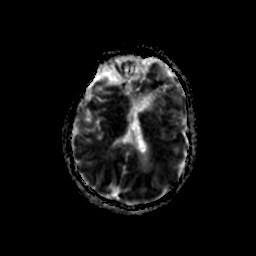
[im 17/26]
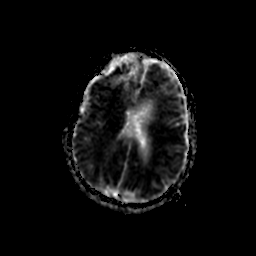
[im 19/26]
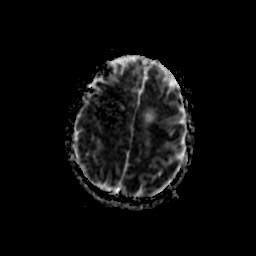
[im 21/26]
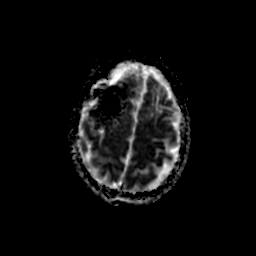
[im 22/26]
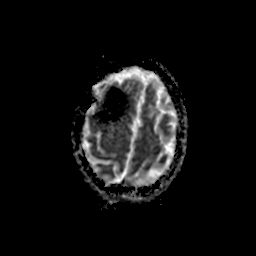
[im 24/26]
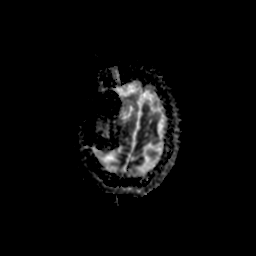
[im 26/26]
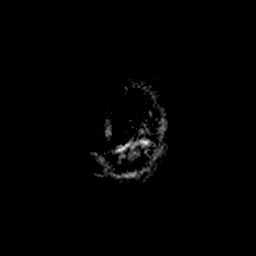

[48 of 48 positions shown; findings below may reference images not displayed]

FINDINGS: Study was limited to diffusion weighted imaging only.  A
complete study was not performed.

Right frontal shunt catheter is in place causing significant
artifact.   Encephalomalacia in the frontal lobes bilaterally.

Possible small area of restricted diffusion in the left medial
thalamus which may represent acute infarct.  Correlate with
clinical symptoms.
IMPRESSION: Limited study.

Possible small area of acute infarct left medial thalamus.

## 2012-02-27 IMAGING — CT CT HEAD W/O CM
1 of 2 series · 13 of 30 positions shown, 17 images · non-contrast
Comparison: Multiple prior studies.  The most recent CT was
02/14/2011.  CT angiogram 07/28/2005

CLINICAL DATA: Altered mental status.  Seizure

CT HEAD WITHOUT CONTRAST
TECHNIQUE: Contiguous axial images were obtained from the base of
the skull through the vertex without contrast.

[Series 2: brain · axial · 0.47mm/px · z∈[+129,+260]mm · 13 of 32 slices shown, 17 images]
[im 3/32  brain]
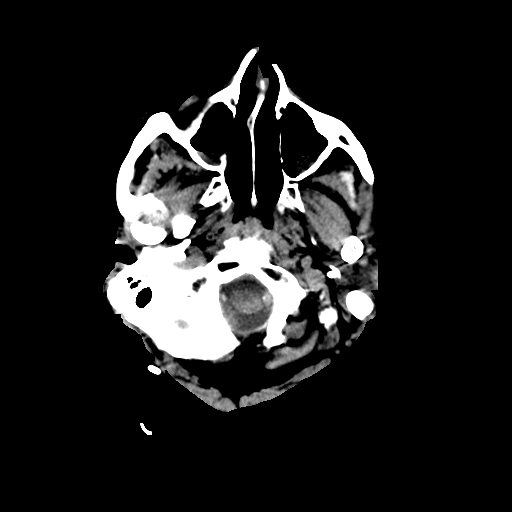
[im 3/32  bone]
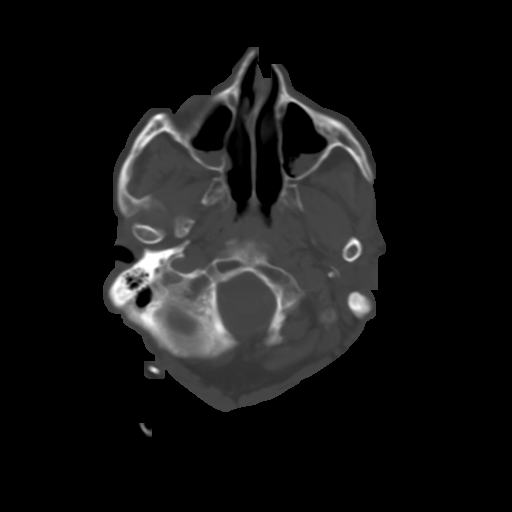
[im 5/32  brain]
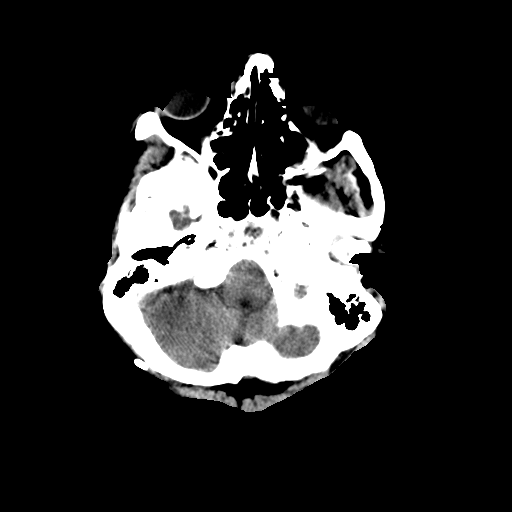
[im 7/32  brain]
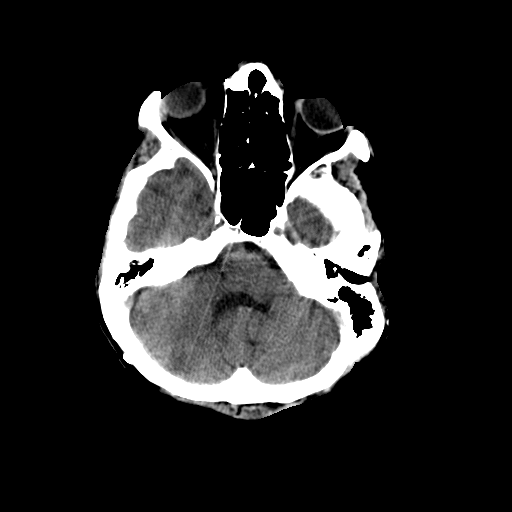
[im 9/32  brain]
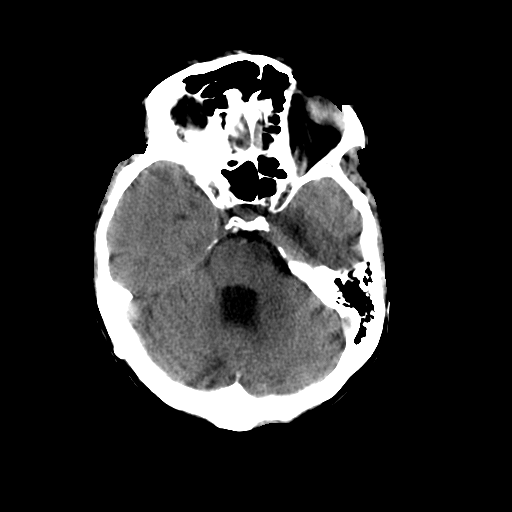
[im 12/32  brain]
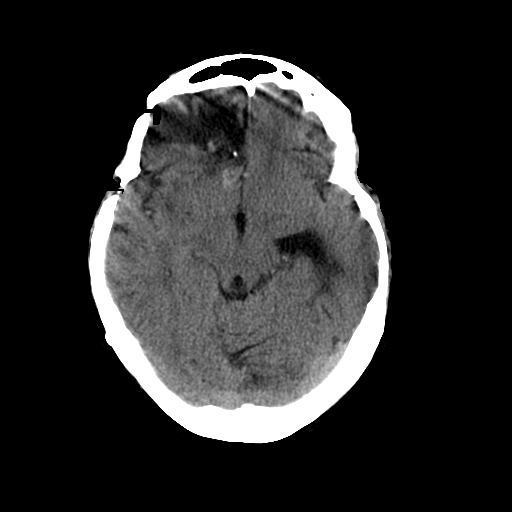
[im 12/32  bone]
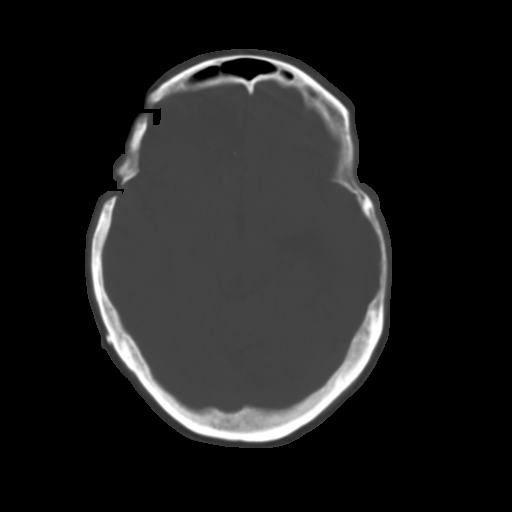
[im 14/32  brain]
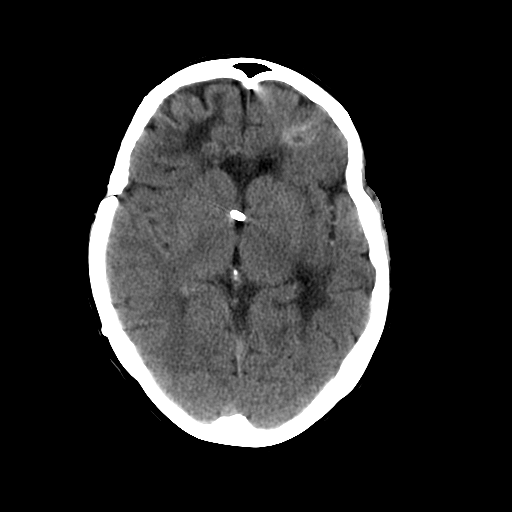
[im 16/32  brain]
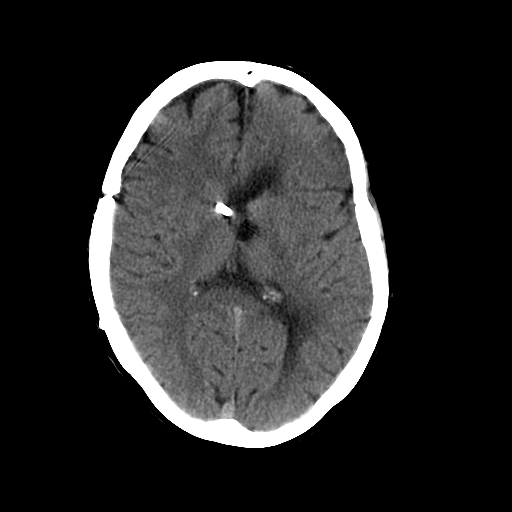
[im 18/32  brain]
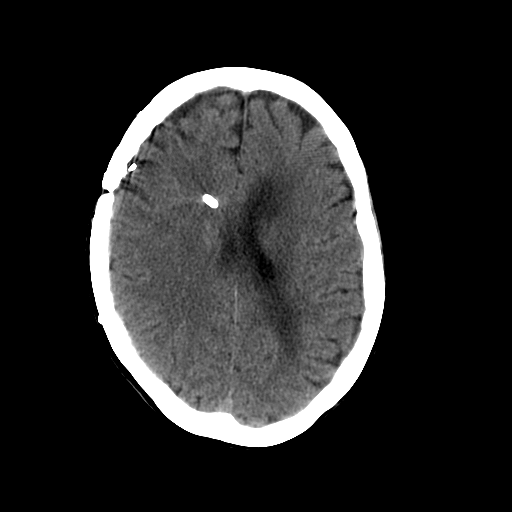
[im 20/32  brain]
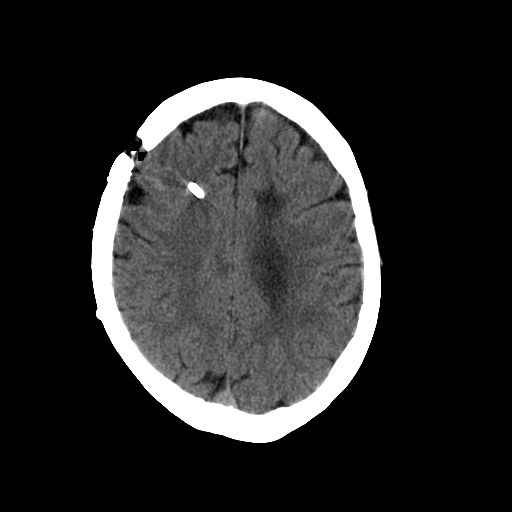
[im 20/32  bone]
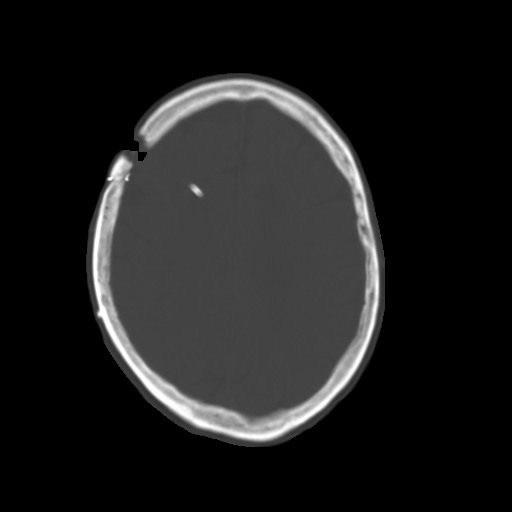
[im 23/32  brain]
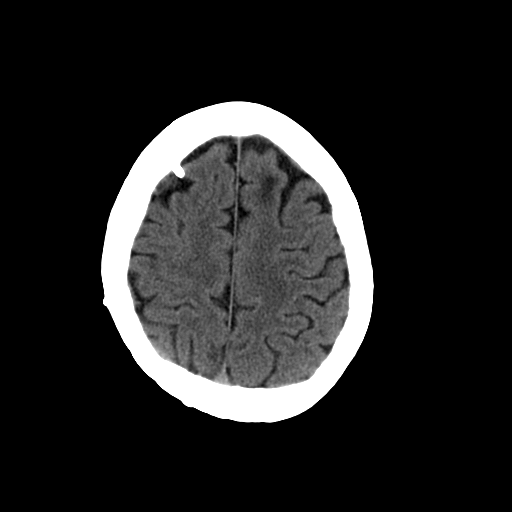
[im 25/32  brain]
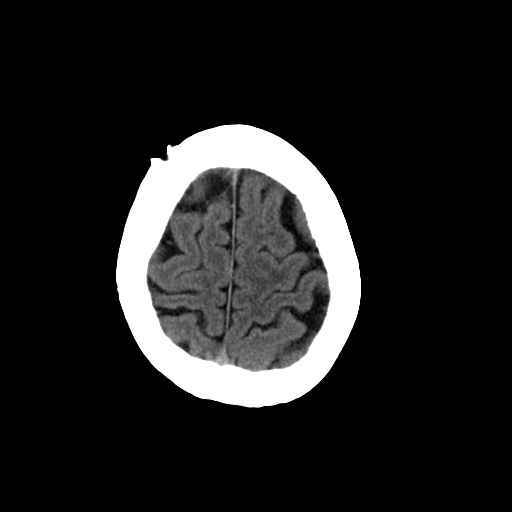
[im 27/32  brain]
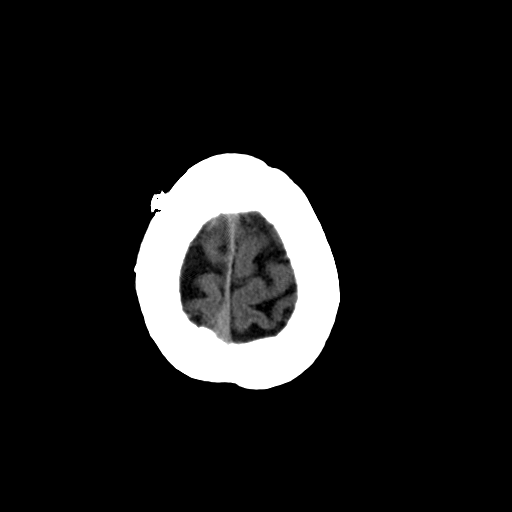
[im 29/32  brain]
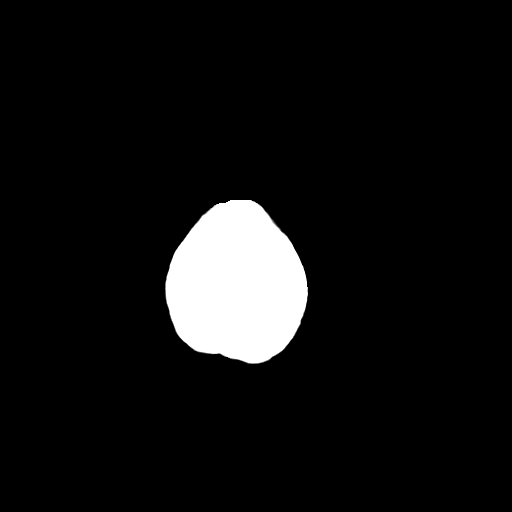
[im 29/32  bone]
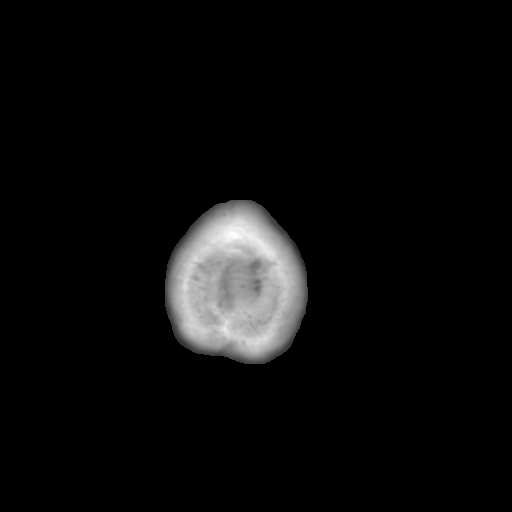

[13 of 30 positions shown; findings below may reference images not displayed]

FINDINGS: Prior right frontal craniotomy.  Right frontal
ventricular shunt catheter extends into the third ventricle and is
unchanged.  Right lateral ventricle is decompressed and slit-like.
Left lateral ventricle is mildly enlarged with mild enlargement of
the left temporal horn.  This is stable.  The fourth ventricle is
mildly enlarged and this is unchanged.  No midline shift.

Encephalomalacia right frontal lobe is stable.  Left frontal
hyperintensity is unchanged.  This is associated with small
calcifications.  This appears to be a chronic process and may be
related to chronic hemorrhage related to a vascular malformation.
This may be a small AVM or venous malformation.  Abnormal
vasculature is present in this area on the CTA 07/28/2005.  No
acute hemorrhage.

Hypodensity throughout the left cerebral white matter is stable and
is compatible with chronic ischemia.  No acute infarct or mass.

Sinusitis with air-fluid levels in both maxillary sinuses.
IMPRESSION: No change from the prior CT.  The right lateral and third
ventricles are decompressed with a catheter.  There is mild chronic
enlargement of the left lateral ventricle and fourth ventricle.

## 2012-02-27 IMAGING — CR DG CHEST 1V PORT
1 series · 1 of 1 positions shown · non-contrast
Comparison: 02/13/2011

CLINICAL DATA: Respiratory distress

PORTABLE CHEST - 1 VIEW

[view not recorded]
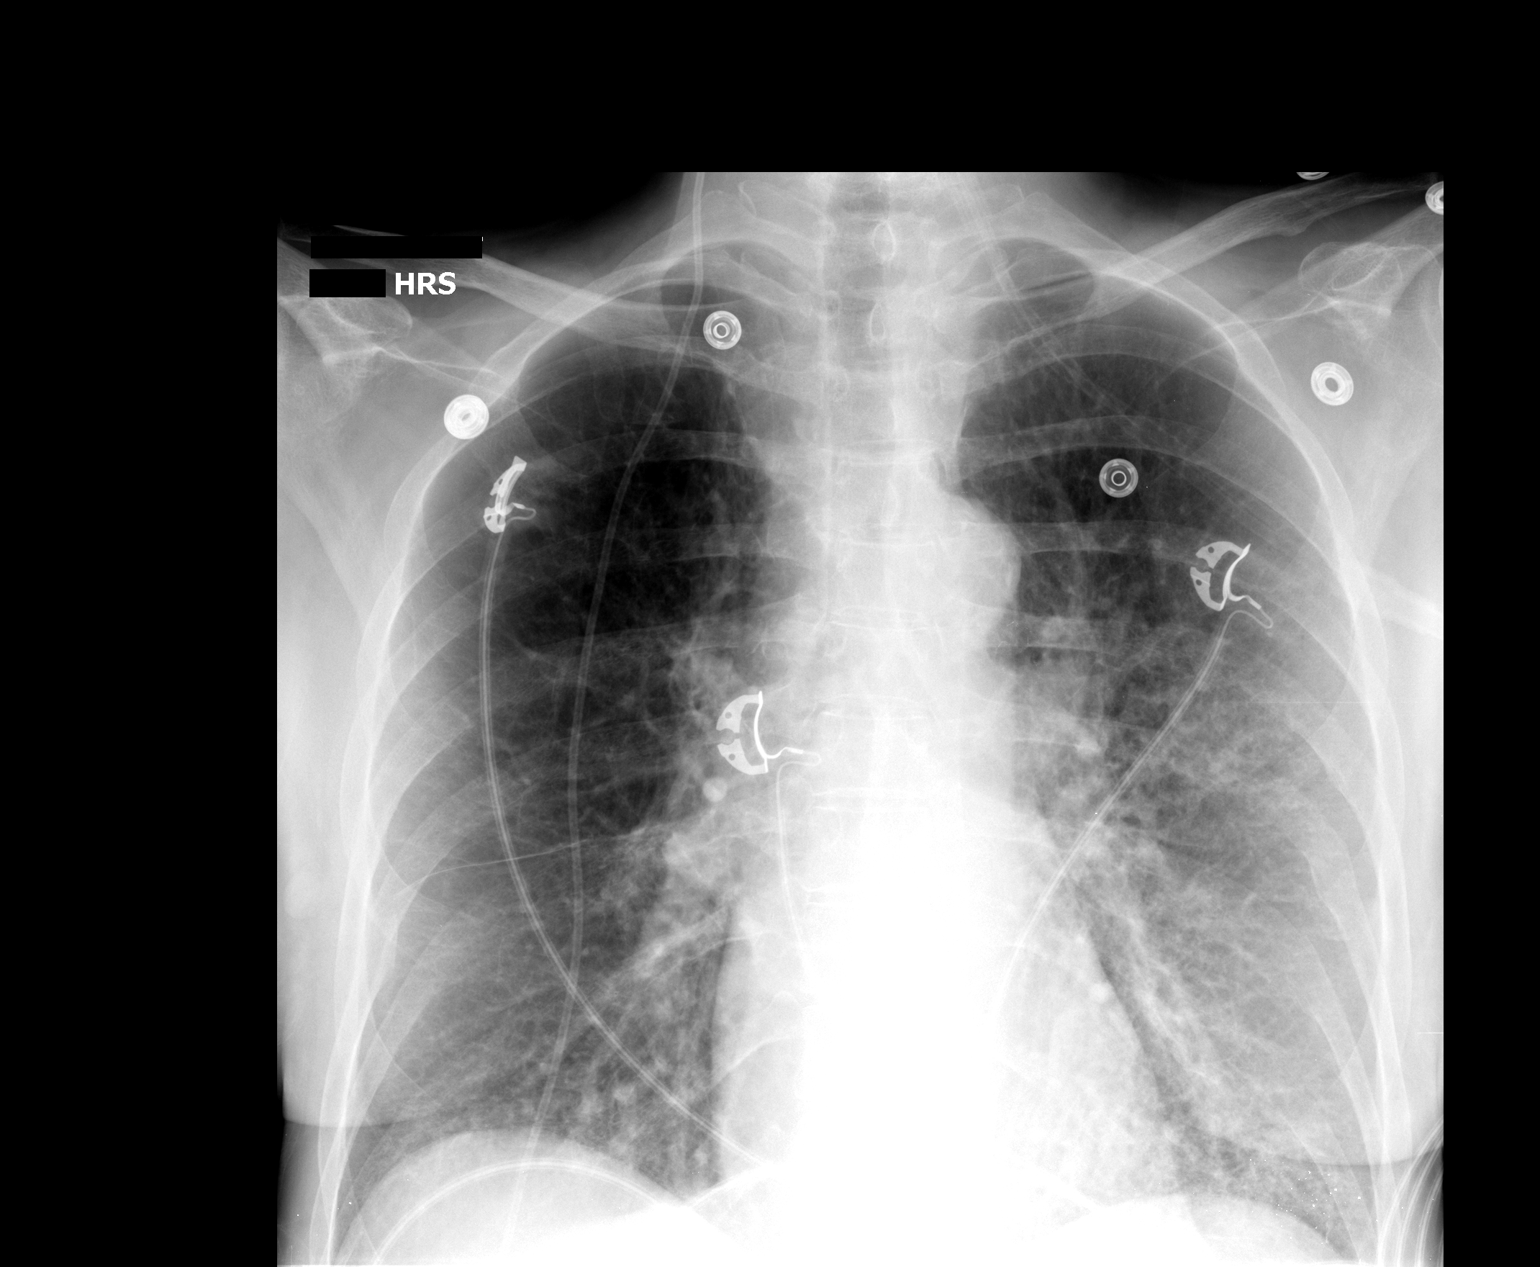

[1 of 1 positions shown; findings below may reference images not displayed]

FINDINGS: VP shunt tubing over the right hemithorax.  Heart size
normal.  No effusion.  New interstitial and airspace infiltrates or
edema in the left mid and lower lung and to a lesser at the right
lung base.  Regional bones unremarkable. Bilateral rib fractures.
IMPRESSION: 1.  Asymmetric bibasilar infiltrates or atypical edema, left worse
than right.

## 2012-02-28 ENCOUNTER — Emergency Department (HOSPITAL_COMMUNITY): Payer: Medicaid Other

## 2012-02-28 ENCOUNTER — Encounter (HOSPITAL_COMMUNITY): Payer: Self-pay | Admitting: Emergency Medicine

## 2012-02-28 ENCOUNTER — Observation Stay (HOSPITAL_COMMUNITY)
Admission: EM | Admit: 2012-02-28 | Discharge: 2012-02-29 | Disposition: A | Discharge status: Home / Self Care | Payer: Medicaid Other | Attending: Emergency Medicine | Admitting: Emergency Medicine

## 2012-02-28 DIAGNOSIS — J449 Chronic obstructive pulmonary disease, unspecified: Secondary | ICD-10-CM | POA: Insufficient documentation

## 2012-02-28 DIAGNOSIS — Z79899 Other long term (current) drug therapy: Secondary | ICD-10-CM | POA: Insufficient documentation

## 2012-02-28 DIAGNOSIS — G40802 Other epilepsy, not intractable, without status epilepticus: Principal | ICD-10-CM | POA: Insufficient documentation

## 2012-02-28 DIAGNOSIS — R569 Unspecified convulsions: Secondary | ICD-10-CM

## 2012-02-28 DIAGNOSIS — Z9889 Other specified postprocedural states: Secondary | ICD-10-CM | POA: Insufficient documentation

## 2012-02-28 DIAGNOSIS — F17200 Nicotine dependence, unspecified, uncomplicated: Secondary | ICD-10-CM | POA: Insufficient documentation

## 2012-02-28 DIAGNOSIS — J4489 Other specified chronic obstructive pulmonary disease: Secondary | ICD-10-CM | POA: Insufficient documentation

## 2012-02-28 DIAGNOSIS — G9389 Other specified disorders of brain: Secondary | ICD-10-CM | POA: Insufficient documentation

## 2012-02-28 LAB — URINALYSIS, ROUTINE W REFLEX MICROSCOPIC
Bilirubin Urine: NEGATIVE
Glucose, UA: NEGATIVE mg/dL
Hgb urine dipstick: NEGATIVE
Ketones, ur: NEGATIVE mg/dL
Nitrite: NEGATIVE
Protein, ur: NEGATIVE mg/dL
Specific Gravity, Urine: 1.005 (ref 1.005–1.030)
Urobilinogen, UA: 0.2 mg/dL (ref 0.0–1.0)
pH: 7 (ref 5.0–8.0)

## 2012-02-28 LAB — CBC WITH DIFFERENTIAL/PLATELET
Basophils Absolute: 0 10*3/uL (ref 0.0–0.1)
Basophils Relative: 0 % (ref 0–1)
Eosinophils Absolute: 0.2 10*3/uL (ref 0.0–0.7)
Eosinophils Relative: 3 % (ref 0–5)
HCT: 39.8 % (ref 36.0–46.0)
Hemoglobin: 13.7 g/dL (ref 12.0–15.0)
Lymphocytes Relative: 40 % (ref 12–46)
Lymphs Abs: 2.4 10*3/uL (ref 0.7–4.0)
MCH: 34.4 pg — ABNORMAL HIGH (ref 26.0–34.0)
MCHC: 34.4 g/dL (ref 30.0–36.0)
MCV: 100 fL (ref 78.0–100.0)
Monocytes Absolute: 0.4 10*3/uL (ref 0.1–1.0)
Monocytes Relative: 6 % (ref 3–12)
Neutro Abs: 3 10*3/uL (ref 1.7–7.7)
Neutrophils Relative %: 51 % (ref 43–77)
Platelets: 112 10*3/uL — ABNORMAL LOW (ref 150–400)
RBC: 3.98 MIL/uL (ref 3.87–5.11)
RDW: 12.3 % (ref 11.5–15.5)
WBC: 5.9 10*3/uL (ref 4.0–10.5)

## 2012-02-28 LAB — COMPREHENSIVE METABOLIC PANEL
ALT: 148 U/L — ABNORMAL HIGH (ref 0–35)
AST: 140 U/L — ABNORMAL HIGH (ref 0–37)
Albumin: 3.4 g/dL — ABNORMAL LOW (ref 3.5–5.2)
Alkaline Phosphatase: 110 U/L (ref 39–117)
BUN: 7 mg/dL (ref 6–23)
CO2: 25 mEq/L (ref 19–32)
Calcium: 9 mg/dL (ref 8.4–10.5)
Chloride: 109 mEq/L (ref 96–112)
Creatinine, Ser: 0.46 mg/dL — ABNORMAL LOW (ref 0.50–1.10)
GFR calc Af Amer: >90 mL/min (ref >90)
GFR calc non Af Amer: >90 mL/min (ref >90)
Glucose, Bld: 86 mg/dL (ref 70–99)
Potassium: 4.1 mEq/L (ref 3.5–5.1)
Sodium: 146 mEq/L — ABNORMAL HIGH (ref 135–145)
Total Bilirubin: 0.4 mg/dL (ref 0.3–1.2)
Total Protein: 6.8 g/dL (ref 6.0–8.3)

## 2012-02-28 LAB — PHENYTOIN LEVEL, TOTAL: Phenytoin Lvl: <2.5 ug/mL — ABNORMAL LOW (ref 10.0–20.0)

## 2012-02-28 LAB — URINE MICROSCOPIC-ADD ON

## 2012-02-28 LAB — RAPID URINE DRUG SCREEN, HOSP PERFORMED
Amphetamines: NOT DETECTED
Barbiturates: NOT DETECTED
Benzodiazepines: NOT DETECTED
Cocaine: NOT DETECTED
Opiates: NOT DETECTED
Tetrahydrocannabinol: NOT DETECTED

## 2012-02-28 LAB — ETHANOL: Alcohol, Ethyl (B): 86 mg/dL — ABNORMAL HIGH (ref 0–11)

## 2012-02-28 IMAGING — CR DG CHEST 2V
1 series · 1 of 1 positions shown · non-contrast
Comparison: 06/03/2011 and 02/08/2011

CLINICAL DATA: Cough with shortness of breath.  Evaluate for
aspiration pneumonia

CHEST - 2 VIEW

[w chest lat]
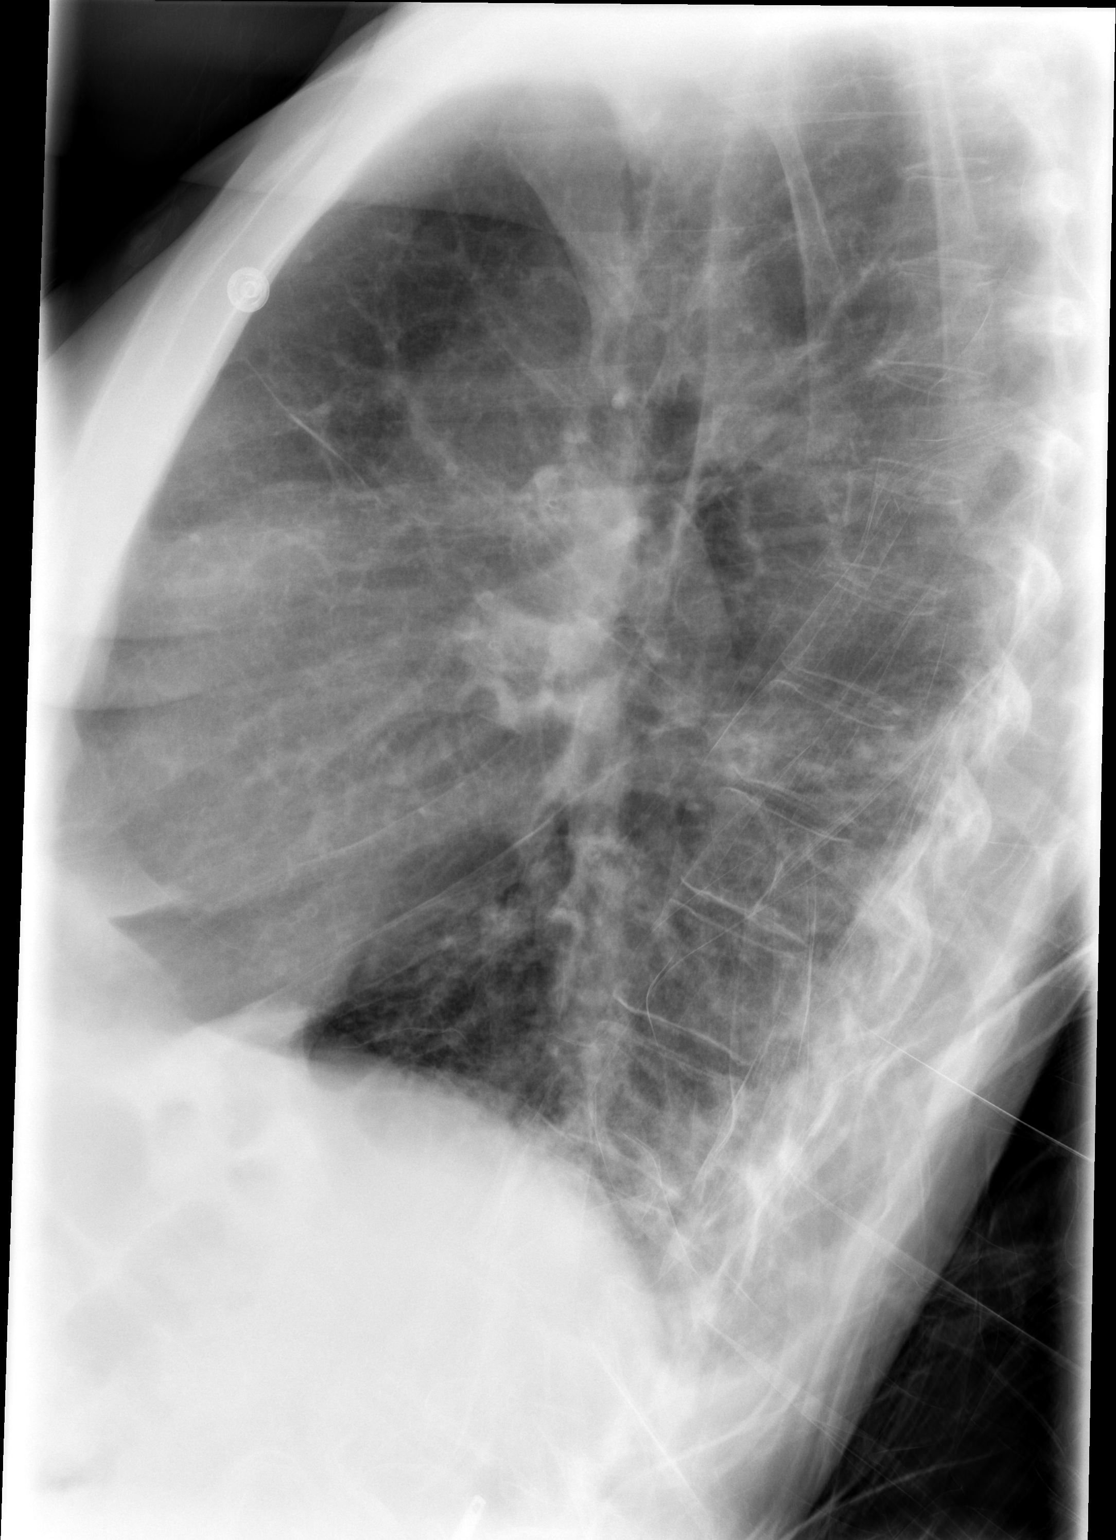

[1 of 1 positions shown; findings below may reference images not displayed]

FINDINGS: Changes of underlying COPD are identified with a stable
degree of hyperinflation.  Heart size is within normal limits and a
stable mediastinal contour is seen.

The lung fields are notable for increased interstitial markings
involving the posterior and apical segments of the left lower lobe.
Interstitial septal lines, mild prominence of the fissures and a
small left pleural effusion are noted. Some mild improvement is
noted in comparison with the prior exam and these findings as well
as some increased delineation to the vascular markings in the right
hemithorax suggests that this may be the result of improving
asymmetric pulmonary edema rather than due to an infiltrative
process. This would be even more suspicious is if the patient
typically lies with the left side down. An area of left lower lobe
pneumonia is not completely ruled out and continued follow-up to
clearing would be recommended.

Bony structures appear intact with stable healed left-sided rib
fractures.  Ventriculoperitoneal shunt tube is again seen overlying
the right chest
IMPRESSION: Improving interstitial infiltrates with some persistent density at
the left base favors resolving asymmetric interstitial edema.  As
left lower lobe pneumonia could still be present, follow-up to
clearing is recommended.

## 2012-02-28 MED ORDER — LEVETIRACETAM 500 MG PO TABS
500.0000 mg | ORAL_TABLET | Freq: Once | ORAL | Status: AC
  Administered 2012-02-28: 500 mg via ORAL
  Filled 2012-02-28: qty 1

## 2012-02-28 NOTE — ED Notes (Addendum)
Pt reports feeling weird, felt like she was going to have seizure, pt twitching at triage, has hx of seizures after hemorrhage, currently has shunt placed; pt does not feel like this is anxiety

## 2012-02-28 NOTE — ED Provider Notes (Signed)
History     CSN: [id number]  Arrival date & time 02/28/12  2049   First MD Initiated Contact with Patient 02/28/12 2153      Chief Complaint  Patient presents with  . Seizures    (Consider location/radiation/quality/duration/timing/severity/associated sxs/prior treatment) HPI Comments: Patient presents by EMS after feeling "not right" like she was going to have a seizure. She has a history of seizure disorder was recently admitted for Dilantin toxicity and switched to Keppra one month ago. She states compliance with her Keppra. She was anxious and came back and EMS arrival hyperventilating. She now feels back to baseline. Seizure activity was witnessed. She needs to drinking 2 beers tonight. He denies any tongue biting urinary incontinence. She is at her normal baseline per family. She denies any fevers, chills, vomiting.  The history is provided by the patient and a relative.    Past Medical History  Diagnosis Date  . Meningitis 2009  . Hx of ventricular shunt 2009  . Seizure disorder   . 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 Suicide attempt     History of suicide attempts August 2006 and July 2006  . History of alcohol abuse   . History of cocaine abuse   . Major depressive disorder     History of  . History of iron deficiency   . COPD (chronic obstructive pulmonary disease)   . History of acute alcoholic hepatitis   . Psychiatric diagnosis     Multiple  . 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 Left breast mass     Past Surgical History  Procedure Date  . Craniotomy   . Breast mass removal     Family History  Problem Relation Age of Onset  . Coronary artery disease Father     History  Substance Use Topics  . Smoking status: Current Every Day Smoker -- 1.0 packs/day    Types: Cigarettes  . Smokeless  tobacco: Not on file  . Alcohol Use: Yes    OB History    Grav Para Term Preterm Abortions TAB SAB Ect Mult Living                  Review of Systems  Constitutional: Negative for fever, activity change and appetite change.  HENT: Negative for congestion and rhinorrhea.   Respiratory: Negative for chest tightness.   Cardiovascular: Negative for chest pain.  Gastrointestinal: Negative for nausea, vomiting and abdominal pain.  Genitourinary: Negative for vaginal bleeding and vaginal discharge.  Musculoskeletal: Negative for back pain.  Skin: Negative for rash.  Neurological: Positive for seizures and headaches. Negative for dizziness.    Allergies  Review of patient's allergies indicates no known allergies.  Home Medications   Current Outpatient Rx  Name Route Sig Dispense Refill  . ALBUTEROL SULFATE HFA 108 (90 BASE) MCG/ACT IN AERS Inhalation Inhale 1 puff into the lungs every 6 (six) hours as needed. For shortness of breath. 1 Inhaler 6  . ASPIRIN 81 MG PO CHEW Oral Chew 1 tablet (81 mg total) by mouth daily. 30 tablet 6  . B COMPLEX PO TABS Oral Take 1 tablet by mouth 2 (two) times daily. 62 tablet 0  . FLUTICASONE-SALMETEROL 250-50 MCG/DOSE IN AEPB Inhalation Inhale 1 puff into the lungs every 12 (twelve) hours. 60 each 6  .  LEVETIRACETAM 500 MG PO TABS Oral Take 1 tablet (500 mg total) by mouth 2 (two) times daily. 62 tablet 0  . LISINOPRIL 10 MG PO TABS Oral Take 1 tablet (10 mg total) by mouth daily. 30 tablet 3  . METOPROLOL TARTRATE 50 MG PO TABS Oral Take 1 tablet (50 mg total) by mouth 2 (two) times daily. 60 tablet 6  . ADULT MULTIVITAMIN W/MINERALS CH Oral Take 1 tablet by mouth daily. 30 tablet 6  . SIMVASTATIN 20 MG PO TABS Oral Take 1 tablet (20 mg total) by mouth daily at 6 PM. 30 tablet 6  . THIAMINE HCL 100 MG PO TABS Oral Take 1 tablet (100 mg total) by mouth daily. 30 tablet 6  . TIOTROPIUM BROMIDE MONOHYDRATE 18 MCG IN CAPS Inhalation Place 1 capsule (18  mcg total) into inhaler and inhale daily. 30 capsule 6    BP 138/88  Pulse 91  Temp 98.1 F (36.7 C) (Oral)  Resp 20  SpO2 96%  Physical Exam  Constitutional: She is oriented to person, place, and time. She appears well-developed and well-nourished. No distress.  HENT:  Head: Normocephalic and atraumatic.  Mouth/Throat: Oropharynx is clear and moist. No oropharyngeal exudate.  Eyes: Conjunctivae normal and EOM are normal. Pupils are equal, round, and reactive to light.  Neck: Normal range of motion. Neck supple.  Cardiovascular: Normal rate, regular rhythm and normal heart sounds.   No murmur heard. Pulmonary/Chest: Effort normal and breath sounds normal. No respiratory distress.  Abdominal: Soft. There is no tenderness. There is no rebound and no guarding.  Musculoskeletal: Normal range of motion. She exhibits no edema and no tenderness.  Neurological: She is alert and oriented to person, place, and time. No cranial nerve deficit.       CN 2-12 intact, no ataxia on finger to nose, 5/5 strength throughout.  Skin: Skin is warm.    ED Course  Procedures (including critical care time)  Labs Reviewed  CBC WITH DIFFERENTIAL - Abnormal; Notable for the following:    MCH 34.4 (*)     Platelets 112 (*)  CONSISTENT WITH PREVIOUS RESULT   All other components within normal limits  COMPREHENSIVE METABOLIC PANEL  ETHANOL  URINE RAPID DRUG SCREEN (HOSP PERFORMED)  URINALYSIS, ROUTINE W REFLEX MICROSCOPIC  PHENYTOIN LEVEL, TOTAL   Dg Skull 1-3 Views  02/28/2012  *RADIOLOGY REPORT*  Clinical Data: Shunt series.  SKULL - 1-3 VIEW  Comparison: CT performed earlier today.  Plain films 01/07/2012.  Findings: Shunt catheter has a stable coarsened appearance. Catheter appears intact.  No acute bony abnormality.  IMPRESSION: Stable appearance of the shunt catheter.   Original Report Authenticated By: Charlett Nose, M.D.    Dg Chest 1 View  02/28/2012  *RADIOLOGY REPORT*  Clinical Data: Shunt  series.  CHEST - 1 VIEW  Comparison: 01/07/2012  Findings: Shunt catheter noted in the right chest wall.  Stable appearance.  Catheter is intact.  There is hyperinflation of the lungs compatible with COPD.  Heart is upper limits normal in size.  Lungs are clear.  No effusions or acute bony abnormality.  IMPRESSION: Shunt catheter intact.  No acute cardiopulmonary disease.   Original Report Authenticated By: Charlett Nose, M.D.    Dg Abd 1 View  02/28/2012  *RADIOLOGY REPORT*  Clinical Data: Shunt series.  ABDOMEN - 1 VIEW  Comparison: 01/07/2012  Findings: The shunt catheter is seen in the abdomen pelvis with the tip in the left lower pelvis.  Visualized catheter intact.  Nonobstructive bowel gas pattern.  Moderate stool burden throughout the colon.  No organomegaly or free air.  No acute bony abnormality.  IMPRESSION: Shunt catheter intact.  Moderate stool burden.   Original Report Authenticated By: Charlett Nose, M.D.    Ct Head Wo Contrast  02/28/2012  *RADIOLOGY REPORT*  Clinical Data: Seizures.  Slurred speech.  Decreased motor function.  CT HEAD WITHOUT CONTRAST  Technique:  Contiguous axial images were obtained from the base of the skull through the vertex without contrast.  Comparison: 01/26/2012  Findings: Postoperative changes with previous right frontal craniotomy. Right trans frontal ventricular shunt tubing with tip in the third ventricle.  The right ventricle is decompressed. There is dilatation of the left ventricle which appears stable since previous study.  Focal area of encephalomalacia in the right anterior frontal lobe and less prominently in the left anterior frontal lobe.  These changes appear stable since the previous study.  There is a suggestion of focal mass lesion centrally in both frontal lobes, more prominent than on previous study. Developing metastatic disease should be excluded and MRI is recommended for further evaluation.  There is no significant mass effect or midline shift.  No  acute intracranial hemorrhage is visualized.  No abnormal extra-axial fluid collections.  Visualized paranasal sinuses and mastoid air cells are not opacified.  IMPRESSION: Encephalomalacia in the anterior frontal lobes bilaterally appears stable but with suggestion of developing mass lesions.  MRI recommended for further evaluation.   Original Report Authenticated By: Burman Nieves, M.D.      No diagnosis found.    MDM  Seizure disorder with possible seizure tonight versus anxiety. No witnessed seizure. At baseline now per family. Recently switched from Dilantin to Keppra. States compliance.  Shunt appears stable. Head CT abnormal is discussed with Dr. Andria Meuse. He recommends MRI to rule out developing mass lesions. These may be a matter of patient positioning and previous encephalomalacia but he cannot rule out developing mass lesion. Recommend MRI. Patient does have history of breast mass which he states was benign.  Patient willing to obtain MRI in morning.   Glynn Octave, MD 02/29/12 [phone]

## 2012-02-28 NOTE — ED Notes (Addendum)
Per EMS, pt called wasn't feeling right, + ETOH, anxious, crying; pt has slurred speech and decreased motor function, d/t previous shunt placed; pt hyperventilating when EMS arrived; 140/86, HR 94, RR 24 98% RA

## 2012-02-28 NOTE — ED Notes (Signed)
Pt. States she was eating dinner and "I just started not feeling right. It felt like my past seizures and I started twitching". Pt. Alert and oriented x4. Pt. States drank 2 beers. Pt. Twitching upon arrival but currently resting quietly in bed. No complaints.

## 2012-02-28 NOTE — ED Notes (Signed)
Pt. In CT and Xray

## 2012-02-29 ENCOUNTER — Other Ambulatory Visit (HOSPITAL_COMMUNITY): Payer: Medicaid Other

## 2012-02-29 MED ORDER — ADULT MULTIVITAMIN W/MINERALS CH
1.0000 | ORAL_TABLET | Freq: Every day | ORAL | Status: DC
  Administered 2012-02-29: 1 via ORAL
  Filled 2012-02-29: qty 1

## 2012-02-29 MED ORDER — ONDANSETRON HCL 8 MG PO TABS: 4.0000 mg | ORAL_TABLET | Freq: Three times a day (TID) | ORAL | Status: DC | PRN

## 2012-02-29 MED ORDER — LEVETIRACETAM 500 MG PO TABS
500.0000 mg | ORAL_TABLET | Freq: Two times a day (BID) | ORAL | Status: DC
  Administered 2012-02-29: 500 mg via ORAL
  Filled 2012-02-29 (×3): qty 1

## 2012-02-29 MED ORDER — LORAZEPAM 1 MG PO TABS
1.0000 mg | ORAL_TABLET | Freq: Four times a day (QID) | ORAL | Status: DC | PRN
  Administered 2012-02-29: 1 mg via ORAL
  Filled 2012-02-29: qty 1

## 2012-02-29 MED ORDER — THIAMINE HCL 100 MG PO TABS: 100.0000 mg | ORAL_TABLET | Freq: Every day | ORAL | Status: DC

## 2012-02-29 MED ORDER — LORAZEPAM 1 MG PO TABS: 0.0000 mg | ORAL_TABLET | Freq: Four times a day (QID) | ORAL | Status: DC

## 2012-02-29 MED ORDER — SIMVASTATIN 20 MG PO TABS
20.0000 mg | ORAL_TABLET | Freq: Every day | ORAL | Status: DC
  Filled 2012-02-29: qty 1

## 2012-02-29 MED ORDER — MOMETASONE FURO-FORMOTEROL FUM 100-5 MCG/ACT IN AERO
2.0000 | INHALATION_SPRAY | Freq: Two times a day (BID) | RESPIRATORY_TRACT | Status: DC
  Administered 2012-02-29: 2 via RESPIRATORY_TRACT
  Filled 2012-02-29: qty 8.8

## 2012-02-29 MED ORDER — LISINOPRIL 10 MG PO TABS
10.0000 mg | ORAL_TABLET | Freq: Every day | ORAL | Status: DC
  Administered 2012-02-29: 10 mg via ORAL
  Filled 2012-02-29: qty 1

## 2012-02-29 MED ORDER — TIOTROPIUM BROMIDE MONOHYDRATE 18 MCG IN CAPS
18.0000 ug | ORAL_CAPSULE | Freq: Every day | RESPIRATORY_TRACT | Status: DC
  Administered 2012-02-29: 18 ug via RESPIRATORY_TRACT
  Filled 2012-02-29: qty 5

## 2012-02-29 MED ORDER — ASPIRIN 81 MG PO CHEW
81.0000 mg | CHEWABLE_TABLET | Freq: Every day | ORAL | Status: DC
  Administered 2012-02-29: 81 mg via ORAL
  Filled 2012-02-29: qty 1

## 2012-02-29 MED ORDER — THIAMINE HCL 100 MG/ML IJ SOLN: 100.0000 mg | Freq: Every day | INTRAMUSCULAR | Status: DC

## 2012-02-29 MED ORDER — VITAMIN B-1 100 MG PO TABS
100.0000 mg | ORAL_TABLET | Freq: Every day | ORAL | Status: DC
  Administered 2012-02-29: 100 mg via ORAL
  Filled 2012-02-29: qty 1

## 2012-02-29 MED ORDER — LORAZEPAM 2 MG/ML IJ SOLN: 1.0000 mg | Freq: Four times a day (QID) | INTRAMUSCULAR | Status: DC | PRN

## 2012-02-29 MED ORDER — IBUPROFEN 400 MG PO TABS: 600.0000 mg | ORAL_TABLET | Freq: Three times a day (TID) | ORAL | Status: DC | PRN

## 2012-02-29 MED ORDER — METOPROLOL TARTRATE 25 MG PO TABS
50.0000 mg | ORAL_TABLET | Freq: Two times a day (BID) | ORAL | Status: DC
  Administered 2012-02-29: 50 mg via ORAL

## 2012-02-29 MED ORDER — ADULT MULTIVITAMIN W/MINERALS CH: 1.0000 | ORAL_TABLET | Freq: Every day | ORAL | Status: DC

## 2012-02-29 MED ORDER — METOPROLOL TARTRATE 25 MG PO TABS
50.0000 mg | ORAL_TABLET | Freq: Two times a day (BID) | ORAL | Status: DC
  Filled 2012-02-29 (×2): qty 1

## 2012-02-29 MED ORDER — FOLIC ACID 1 MG PO TABS
1.0000 mg | ORAL_TABLET | Freq: Every day | ORAL | Status: DC
  Administered 2012-02-29: 1 mg via ORAL
  Filled 2012-02-29: qty 1

## 2012-02-29 MED ORDER — LORAZEPAM 1 MG PO TABS: 0.0000 mg | ORAL_TABLET | Freq: Two times a day (BID) | ORAL | Status: DC

## 2012-02-29 NOTE — ED Notes (Signed)
Victorino Dike (Daughter): [phone]   Dewayne Hatch (Mother): [phone]

## 2012-02-29 NOTE — ED Provider Notes (Signed)
Wanda Prince is a 56 y.o. female in CDU from pod B. Sign out from Dr. Hyacinth Meeker as follows: Patient with history of seizure disorder recently switched from Dilantin to Keppra coming in with headache and possible seizure activities last night. CT shows possible frontal mass recommended followup with MRI. She is pending MRI this a.m.  Patient seen and examined at the bedside. She is in no acute distress. She denies any pain. Lung sounds are clear to auscultation bilaterally, heart is regular rate and rhythm with no murmurs rubs or gallops, abdominal exam is benign nontender.  MRI delayed as MRI tech has noted patient has a Codman shunt, which may require reprogramming after the MRI. I will consult neurosurgery as patient cannot remember the name of her neurosurgeon or the group that they are asssociated with. She is a very poor historian.   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 Jeral Fruit appreciated: Shunt cannot be adjusted by his group if pressure goals are unknown. He has examined the current CT an interval image and feels that imaging is urgent but not emergent. I will try to get in touch with patient's sister to find out who the patient's current neurologist is.   Spoke to patient's mother and at [phone] and also to patient's sister Gigi Gin at [phone] and other contact number is [phone] according to patient's sister patient is currently being followed by neurologist Dr. Terrace Arabia and at [facility], and her primary care is Dr. Duanne Guess at [facility]. Patient's mother has heart it was given to her by Dr. Channing Mutters that says that the standpoint of the shunt is 50 mm of water. I agree consult a neurosurgeon Dr. Jeral Fruit who states that he cannot adjust the shunt because he did not have the appropriate. 2 so this will have to be managed as an outpatient. He states that as Dr. Channing Mutters is still in practice and eat and that the  patient's mother and sister should call Dr. Channing Mutters to arrange for a reprogramming of the shunt after the MRI. I discussed this personally and at length with the patient, the patient's mother and the patient's sister, they understand the importance of obtaining the MRI and also in making sure the shunt is adjusted to the proper setting.  Patient has stable vital signs, ambulating without difficulty and no signs of seizure activity since she has been in the hospital. Pt verbalized understanding and agrees with care plan. Outpatient follow-up and return precautions given.     Wynetta Emery, PA-C 02/29/12 1206

## 2012-03-01 LAB — URINE CULTURE: Colony Count: 5000

## 2012-03-02 NOTE — ED Provider Notes (Signed)
Pt of Dr Hyacinth Meeker  Suzi Roots, MD 03/02/12 [phone]

## 2012-03-12 ENCOUNTER — Other Ambulatory Visit: Payer: Self-pay | Admitting: Gastroenterology

## 2012-03-12 DIAGNOSIS — B1910 Unspecified viral hepatitis B without hepatic coma: Secondary | ICD-10-CM

## 2012-03-12 DIAGNOSIS — R7989 Other specified abnormal findings of blood chemistry: Secondary | ICD-10-CM

## 2012-03-23 ENCOUNTER — Ambulatory Visit
Admission: RE | Admit: 2012-03-23 | Discharge: 2012-03-23 | Disposition: A | Discharge status: Home / Self Care | Payer: Medicaid Other | Source: Ambulatory Visit | Attending: Gastroenterology | Admitting: Gastroenterology

## 2012-03-23 DIAGNOSIS — R7989 Other specified abnormal findings of blood chemistry: Secondary | ICD-10-CM

## 2012-03-23 DIAGNOSIS — B1910 Unspecified viral hepatitis B without hepatic coma: Secondary | ICD-10-CM

## 2012-07-20 ENCOUNTER — Emergency Department (HOSPITAL_COMMUNITY)
Admission: EM | Admit: 2012-07-20 | Discharge: 2012-07-20 | Disposition: A | Discharge status: Home / Self Care | Payer: Medicaid Other | Attending: Emergency Medicine | Admitting: Emergency Medicine

## 2012-07-20 ENCOUNTER — Encounter (HOSPITAL_COMMUNITY): Payer: Self-pay | Admitting: Emergency Medicine

## 2012-07-20 DIAGNOSIS — R52 Pain, unspecified: Secondary | ICD-10-CM | POA: Insufficient documentation

## 2012-07-20 DIAGNOSIS — Z872 Personal history of diseases of the skin and subcutaneous tissue: Secondary | ICD-10-CM | POA: Insufficient documentation

## 2012-07-20 DIAGNOSIS — Z8709 Personal history of other diseases of the respiratory system: Secondary | ICD-10-CM | POA: Insufficient documentation

## 2012-07-20 DIAGNOSIS — Z8679 Personal history of other diseases of the circulatory system: Secondary | ICD-10-CM | POA: Insufficient documentation

## 2012-07-20 DIAGNOSIS — Z8659 Personal history of other mental and behavioral disorders: Secondary | ICD-10-CM | POA: Insufficient documentation

## 2012-07-20 DIAGNOSIS — IMO0002 Reserved for concepts with insufficient information to code with codable children: Secondary | ICD-10-CM | POA: Insufficient documentation

## 2012-07-20 DIAGNOSIS — F1410 Cocaine abuse, uncomplicated: Secondary | ICD-10-CM | POA: Insufficient documentation

## 2012-07-20 DIAGNOSIS — Z87798 Personal history of other (corrected) congenital malformations: Secondary | ICD-10-CM | POA: Insufficient documentation

## 2012-07-20 DIAGNOSIS — Z7982 Long term (current) use of aspirin: Secondary | ICD-10-CM | POA: Insufficient documentation

## 2012-07-20 DIAGNOSIS — M542 Cervicalgia: Secondary | ICD-10-CM | POA: Insufficient documentation

## 2012-07-20 DIAGNOSIS — Z8669 Personal history of other diseases of the nervous system and sense organs: Secondary | ICD-10-CM | POA: Insufficient documentation

## 2012-07-20 DIAGNOSIS — G40909 Epilepsy, unspecified, not intractable, without status epilepticus: Secondary | ICD-10-CM | POA: Insufficient documentation

## 2012-07-20 DIAGNOSIS — Z79899 Other long term (current) drug therapy: Secondary | ICD-10-CM | POA: Insufficient documentation

## 2012-07-20 DIAGNOSIS — F17200 Nicotine dependence, unspecified, uncomplicated: Secondary | ICD-10-CM | POA: Insufficient documentation

## 2012-07-20 DIAGNOSIS — Z982 Presence of cerebrospinal fluid drainage device: Secondary | ICD-10-CM | POA: Insufficient documentation

## 2012-07-20 DIAGNOSIS — Z8719 Personal history of other diseases of the digestive system: Secondary | ICD-10-CM | POA: Insufficient documentation

## 2012-07-20 DIAGNOSIS — J449 Chronic obstructive pulmonary disease, unspecified: Secondary | ICD-10-CM | POA: Insufficient documentation

## 2012-07-20 DIAGNOSIS — Z862 Personal history of diseases of the blood and blood-forming organs and certain disorders involving the immune mechanism: Secondary | ICD-10-CM | POA: Insufficient documentation

## 2012-07-20 DIAGNOSIS — F329 Major depressive disorder, single episode, unspecified: Secondary | ICD-10-CM | POA: Insufficient documentation

## 2012-07-20 DIAGNOSIS — R51 Headache: Secondary | ICD-10-CM | POA: Insufficient documentation

## 2012-07-20 DIAGNOSIS — Z8742 Personal history of other diseases of the female genital tract: Secondary | ICD-10-CM | POA: Insufficient documentation

## 2012-07-20 DIAGNOSIS — J4489 Other specified chronic obstructive pulmonary disease: Secondary | ICD-10-CM | POA: Insufficient documentation

## 2012-07-20 MED ORDER — OXYCODONE-ACETAMINOPHEN 5-325 MG PO TABS: 2.0000 | ORAL_TABLET | Freq: Four times a day (QID) | ORAL | Status: DC | PRN

## 2012-07-20 NOTE — ED Provider Notes (Signed)
History  This chart was scribed for Hurman Horn, MD by Ardeen Jourdain, ED Scribe. This patient was seen in room [facility] and the patient's care was started at 2233.  CSN: [id number]  Arrival date & time 07/20/12  1958   First MD Initiated Contact with Patient 07/20/12 2233      Chief Complaint  Patient presents with  . Neck Pain     The history is provided by the patient. No language interpreter was used.    Wanda Prince is a 57 y.o. female who presents to the Emergency Department complaining of gradual onset, gradually worsening, constant, non-radiating positional neck pain that began 3 days ago with associated mild HA. Pt denies any sudden weakness and numbness. Pt denies any sudden onset severe HA. She denies any recent problems with speech, vision, swallow and understanding. Pt denies any recent trauma or injury. Pt is able to ambulate. She reports taking Tylenol and ibuprofen with slight relief.    Past Medical History  Diagnosis Date  . Meningitis 2009  . Hx of ventricular shunt 2009  . Seizure disorder   . 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 Suicide attempt     History of suicide attempts August 2006 and July 2006  . History of alcohol abuse   . History of cocaine abuse   . Major depressive disorder     History of  . History of iron deficiency   . COPD (chronic obstructive pulmonary disease)   . History of acute alcoholic hepatitis   . Psychiatric diagnosis     Multiple  . 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 Left breast mass     Past Surgical History  Procedure Laterality Date  . Craniotomy    . Breast mass removal      Family History  Problem Relation Age of Onset  . Coronary artery disease Father     History  Substance Use Topics  . Smoking status: Current Every Day Smoker -- 1.00  packs/day    Types: Cigarettes  . Smokeless tobacco: Not on file  . Alcohol Use: Yes   No OB history available.   Review of Systems  10 Systems reviewed and all are negative for acute change except as noted in the HPI.   Allergies  Review of patient's allergies indicates no known allergies.  Home Medications   Current Outpatient Rx  Name  Route  Sig  Dispense  Refill  . albuterol (PROVENTIL HFA;VENTOLIN HFA) 108 (90 BASE) MCG/ACT inhaler   Inhalation   Inhale 1 puff into the lungs every 6 (six) hours as needed. For shortness of breath.   1 Inhaler   6   . aspirin 81 MG chewable tablet   Oral   Chew 81 mg by mouth daily.         Marland Kitchen b complex vitamins tablet   Oral   Take 1 tablet by mouth 2 (two) times daily.   62 tablet   0   . Fluticasone-Salmeterol (ADVAIR) 250-50 MCG/DOSE AEPB   Inhalation   Inhale 1 puff into the lungs every 12 (twelve) hours.   60 each   6   . folic acid (FOLVITE) 1 MG tablet   Oral   Take 1 mg by mouth daily.         Marland Kitchen  ibuprofen (ADVIL,MOTRIN) 200 MG tablet   Oral   Take 400 mg by mouth every 6 (six) hours as needed for headache.         . levETIRAcetam (KEPPRA) 500 MG tablet   Oral   Take 1 tablet (500 mg total) by mouth 2 (two) times daily.   62 tablet   0   . lisinopril (PRINIVIL,ZESTRIL) 10 MG tablet   Oral   Take 10 mg by mouth daily.         . metoprolol succinate (TOPROL-XL) 50 MG 24 hr tablet   Oral   Take 50 mg by mouth 2 (two) times daily. Take with or immediately following a meal.         . Multiple Vitamin (MULITIVITAMIN WITH MINERALS) TABS   Oral   Take 1 tablet by mouth daily.   30 tablet   6   . phenytoin (DILANTIN) 100 MG ER capsule   Oral   Take 200 mg by mouth 2 (two) times daily.         . simvastatin (ZOCOR) 20 MG tablet   Oral   Take 20 mg by mouth every evening.         . thiamine 100 MG tablet   Oral   Take 1 tablet (100 mg total) by mouth daily.   30 tablet   6   . tiotropium  (SPIRIVA) 18 MCG inhalation capsule   Inhalation   Place 1 capsule (18 mcg total) into inhaler and inhale daily.   30 capsule   6   . Vitamin D, Ergocalciferol, (DRISDOL) 50000 UNITS CAPS   Oral   Take 50,000 Units by mouth every 7 (seven) days.         Marland Kitchen oxyCODONE-acetaminophen (PERCOCET) 5-325 MG per tablet   Oral   Take 2 tablets by mouth every 6 (six) hours as needed for pain.   20 tablet   0     Triage Vitals: BP 118/72  Pulse 82  Temp(Src) 97.8 F (36.6 C) (Oral)  Resp 20  SpO2 97%  Physical Exam  Nursing note and vitals reviewed. Constitutional:  Awake, alert, nontoxic appearance with baseline speech for patient.  HENT:  Head: Atraumatic.  Mouth/Throat: No oropharyngeal exudate.  Eyes: EOM are normal. Pupils are equal, round, and reactive to light. Right eye exhibits no discharge. Left eye exhibits no discharge.  Neck: Neck supple.  Diffuse posterior tenderness, no erythema, induration or warmth   Cardiovascular: Normal rate, regular rhythm and normal heart sounds.  Exam reveals no gallop and no friction rub.   No murmur heard. Pulmonary/Chest: Effort normal and breath sounds normal. No stridor. No respiratory distress. She has no wheezes. She has no rales. She exhibits no tenderness.  Abdominal: Soft. Bowel sounds are normal. She exhibits no mass. There is no tenderness. There is no rebound.  Musculoskeletal: She exhibits no tenderness.  Baseline ROM, moves extremities with no obvious new focal weakness.  Lymphadenopathy:    She has no cervical adenopathy.  Neurological:  Awake, alert, cooperative and aware of situation; motor strength bilaterally; sensation normal to light touch bilaterally; peripheral visual fields full to confrontation; no facial asymmetry; tongue midline; major cranial nerves appear intact; no pronator drift, normal finger to nose bilaterally, baseline gait without new ataxia. Intact light touch and motor strength distribution radial, median  and ulnar nerves. Symmetric reflexes in biceps, triceps and brachial radial   Skin: No rash noted.  Psychiatric: She has a normal mood and affect.  ED Course  Procedures (including critical care time)  DIAGNOSTIC STUDIES: Oxygen Saturation is 97% on room air, normal by my interpretation.    COORDINATION OF CARE:  10:35 PM: Patient / Family / Caregiver informed of clinical course, understand medical decision-making process, and agree with plan.    Labs Reviewed - No data to display No results found.   1. Neck pain, acute       MDM  I doubt any other EMC precluding discharge at this time including, but not necessarily limited to the following:SBI, CVA, SAH.     Hurman Horn, MD 07/24/12 2103

## 2012-07-20 NOTE — ED Notes (Addendum)
Patient complaining of neck pain for the past three days; patient denies any recent injury.  Patient reports that she has taken ibuprofen and acetaminophen for the pain, which has "eased the pain" for a short while.  Patient ambulatory in triage (uses cane to walk).  Denies recent cold symptoms.

## 2012-07-22 ENCOUNTER — Other Ambulatory Visit: Payer: Self-pay | Admitting: Nurse Practitioner

## 2012-07-22 ENCOUNTER — Ambulatory Visit
Admission: RE | Admit: 2012-07-22 | Discharge: 2012-07-22 | Disposition: A | Discharge status: Home / Self Care | Payer: Medicaid Other | Source: Ambulatory Visit | Attending: Nurse Practitioner | Admitting: Nurse Practitioner

## 2012-07-22 DIAGNOSIS — G8929 Other chronic pain: Secondary | ICD-10-CM

## 2012-09-22 ENCOUNTER — Encounter (HOSPITAL_COMMUNITY): Payer: Self-pay | Admitting: Emergency Medicine

## 2012-09-22 ENCOUNTER — Emergency Department (HOSPITAL_COMMUNITY)
Admission: EM | Admit: 2012-09-22 | Discharge: 2012-09-22 | Disposition: A | Discharge status: Home / Self Care | Payer: Medicaid Other | Attending: Emergency Medicine | Admitting: Emergency Medicine

## 2012-09-22 DIAGNOSIS — Z8719 Personal history of other diseases of the digestive system: Secondary | ICD-10-CM | POA: Insufficient documentation

## 2012-09-22 DIAGNOSIS — F1411 Cocaine abuse, in remission: Secondary | ICD-10-CM | POA: Insufficient documentation

## 2012-09-22 DIAGNOSIS — J449 Chronic obstructive pulmonary disease, unspecified: Secondary | ICD-10-CM | POA: Insufficient documentation

## 2012-09-22 DIAGNOSIS — J4489 Other specified chronic obstructive pulmonary disease: Secondary | ICD-10-CM | POA: Insufficient documentation

## 2012-09-22 DIAGNOSIS — F329 Major depressive disorder, single episode, unspecified: Secondary | ICD-10-CM | POA: Insufficient documentation

## 2012-09-22 DIAGNOSIS — M542 Cervicalgia: Secondary | ICD-10-CM

## 2012-09-22 DIAGNOSIS — F1011 Alcohol abuse, in remission: Secondary | ICD-10-CM | POA: Insufficient documentation

## 2012-09-22 DIAGNOSIS — Z982 Presence of cerebrospinal fluid drainage device: Secondary | ICD-10-CM | POA: Insufficient documentation

## 2012-09-22 DIAGNOSIS — Z862 Personal history of diseases of the blood and blood-forming organs and certain disorders involving the immune mechanism: Secondary | ICD-10-CM | POA: Insufficient documentation

## 2012-09-22 DIAGNOSIS — Z8661 Personal history of infections of the central nervous system: Secondary | ICD-10-CM | POA: Insufficient documentation

## 2012-09-22 DIAGNOSIS — Z8659 Personal history of other mental and behavioral disorders: Secondary | ICD-10-CM | POA: Insufficient documentation

## 2012-09-22 DIAGNOSIS — R569 Unspecified convulsions: Secondary | ICD-10-CM | POA: Insufficient documentation

## 2012-09-22 DIAGNOSIS — Z79899 Other long term (current) drug therapy: Secondary | ICD-10-CM | POA: Insufficient documentation

## 2012-09-22 DIAGNOSIS — Z7982 Long term (current) use of aspirin: Secondary | ICD-10-CM | POA: Insufficient documentation

## 2012-09-22 DIAGNOSIS — F17200 Nicotine dependence, unspecified, uncomplicated: Secondary | ICD-10-CM | POA: Insufficient documentation

## 2012-09-22 MED ORDER — CYCLOBENZAPRINE HCL 5 MG PO TABS: 5.0000 mg | ORAL_TABLET | Freq: Three times a day (TID) | ORAL | Status: DC | PRN

## 2012-09-22 MED ORDER — OXYCODONE-ACETAMINOPHEN 5-325 MG PO TABS
1.0000 | ORAL_TABLET | Freq: Once | ORAL | Status: AC
  Administered 2012-09-22: 1 via ORAL
  Filled 2012-09-22: qty 1

## 2012-09-22 MED ORDER — OXYCODONE-ACETAMINOPHEN 5-325 MG PO TABS: 1.0000 | ORAL_TABLET | ORAL | Status: DC | PRN

## 2012-09-22 MED ORDER — CYCLOBENZAPRINE HCL 10 MG PO TABS
5.0000 mg | ORAL_TABLET | Freq: Once | ORAL | Status: AC
  Administered 2012-09-22: 5 mg via ORAL
  Filled 2012-09-22: qty 1

## 2012-09-22 NOTE — ED Notes (Signed)
Per ems pt has had back and neck pain x 5 days no injury she staes  Vitals  120/64 heart rate 89 20 resp pt [id number]

## 2012-09-22 NOTE — ED Provider Notes (Signed)
History    This chart was scribed for non-physician practitioner, Arnoldo Hooker PA-C working with Glynn Octave, MD by Donne Anon, ED Scribe. This patient was seen in room TR05C/TR05C and the patient's care was started at 1754.   CSN: [id number]  Arrival date & time 09/22/12  1415   First MD Initiated Contact with Patient 09/22/12 1754      Chief Complaint  Patient presents with  . Back Pain     The history is provided by the patient. No language interpreter was used.   HPI Comments: HPI Comments: Wanda Prince is a 57 y.o. female brought in by ambulance, who presents to the Emergency Department complaining of 1 week of gradual onset, gradually worsening pain consistent with chronic underlying condition, recurrent, constant back and neck pain. She states the pain is worse with movement. The pain radiates up to the top of her head. She has tried ibuprofen and Tylenol with little relief. She denies SOB, CP or any other pain.  Pt is currently a smoker, alcohol user, and cocaine user.  Past Medical History  Diagnosis Date  . Meningitis 2009  . Hx of ventricular shunt 2009  . Seizure disorder   . 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 Suicide attempt     History of suicide attempts August 2006 and July 2006  . History of alcohol abuse   . History of cocaine abuse   . Major depressive disorder     History of  . History of iron deficiency   . COPD (chronic obstructive pulmonary disease)   . History of acute alcoholic hepatitis   . Psychiatric diagnosis     Multiple  . 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 Left breast mass     Past Surgical History  Procedure Laterality Date  . Craniotomy    . Breast mass removal      Family History  Problem Relation Age of Onset  . Coronary artery disease Father      History  Substance Use Topics  . Smoking status: Current Every Day Smoker -- 1.00 packs/day    Types: Cigarettes  . Smokeless tobacco: Not on file  . Alcohol Use: Yes     Review of Systems  Constitutional: Negative for fever.  HENT: Positive for neck pain.   Respiratory: Negative for shortness of breath.   Cardiovascular: Negative for chest pain.  Musculoskeletal: Positive for back pain.    Allergies  Review of patient's allergies indicates no known allergies.  Home Medications   Current Outpatient Rx  Name  Route  Sig  Dispense  Refill  . acetaminophen (TYLENOL) 500 MG tablet   Oral   Take 1,000 mg by mouth every 6 (six) hours as needed for pain.         Marland Kitchen albuterol (PROVENTIL HFA;VENTOLIN HFA) 108 (90 BASE) MCG/ACT inhaler   Inhalation   Inhale 1 puff into the lungs every 6 (six) hours as needed. For shortness of breath.   1 Inhaler   6   . aspirin 81 MG chewable tablet   Oral   Chew 81 mg by mouth daily.         Marland Kitchen b complex vitamins tablet   Oral   Take 1 tablet by mouth 2 (two) times daily.   62 tablet  0   . diclofenac sodium (VOLTAREN) 1 % GEL   Topical   Apply 4 g topically 4 (four) times daily as needed (for muscle pain).         . Fluticasone-Salmeterol (ADVAIR) 250-50 MCG/DOSE AEPB   Inhalation   Inhale 1 puff into the lungs every 12 (twelve) hours.   60 each   6   . folic acid (FOLVITE) 1 MG tablet   Oral   Take 1 mg by mouth daily.         Marland Kitchen levETIRAcetam (KEPPRA) 500 MG tablet   Oral   Take 1 tablet (500 mg total) by mouth 2 (two) times daily.   62 tablet   0   . lisinopril (PRINIVIL,ZESTRIL) 10 MG tablet   Oral   Take 10 mg by mouth daily.         . metoprolol succinate (TOPROL-XL) 50 MG 24 hr tablet   Oral   Take 50 mg by mouth 2 (two) times daily. Take with or immediately following a meal.         . Multiple Vitamin (MULITIVITAMIN WITH MINERALS) TABS   Oral   Take 1 tablet by mouth daily.   30 tablet   6    . phenytoin (DILANTIN) 100 MG ER capsule   Oral   Take 200 mg by mouth 2 (two) times daily.         . simvastatin (ZOCOR) 20 MG tablet   Oral   Take 20 mg by mouth every evening.         . thiamine 100 MG tablet   Oral   Take 1 tablet (100 mg total) by mouth daily.   30 tablet   6   . tiotropium (SPIRIVA) 18 MCG inhalation capsule   Inhalation   Place 1 capsule (18 mcg total) into inhaler and inhale daily.   30 capsule   6   . Vitamin D, Ergocalciferol, (DRISDOL) 50000 UNITS CAPS   Oral   Take 50,000 Units by mouth every 7 (seven) days.           BP 112/73  Pulse 71  Resp 18  SpO2 97%  Physical Exam  Nursing note and vitals reviewed. Constitutional: She is oriented to person, place, and time. She appears well-developed and well-nourished. No distress.  HENT:  Head: Normocephalic and atraumatic.  Eyes: EOM are normal.  Neck: Neck supple. No tracheal deviation present.  Cardiovascular: Normal rate.   Pulmonary/Chest: Effort normal. No respiratory distress.  Musculoskeletal: Normal range of motion. She exhibits tenderness.  Bilateral paracervical tenderness greater than midline cervical tenderness with no swelling.  Neurological: She is alert and oriented to person, place, and time.  Equal grip strength upper extremities.  Skin: Skin is warm and dry.  Psychiatric: She has a normal mood and affect. Her behavior is normal.    ED Course  Procedures (including critical care time) DIAGNOSTIC STUDIES: Oxygen Saturation is 97% on RA, adequate by my interpretation.    COORDINATION OF CARE: 6:06 PM Discussed treatment plan which includes cool compresses, Percocet, and Flexeril with pt at bedside and pt agreed to plan.     Labs Reviewed - No data to display No results found.   No diagnosis found.  1. Neck pain, acute on chronic  MDM  Uncomplicated neck pain c/w muscular origin. No neurologic deficits.      I personally performed the services  described in this documentation, which was scribed in my presence. The recorded  information has been reviewed and is accurate.     Arnoldo Hooker, PA-C 09/22/12 1813

## 2012-09-23 NOTE — ED Provider Notes (Signed)
Medical screening examination/treatment/procedure(s) were performed by non-physician practitioner and as supervising physician I was immediately available for consultation/collaboration.   Glynn Octave, MD 09/23/12 [phone]

## 2012-09-29 ENCOUNTER — Emergency Department (HOSPITAL_COMMUNITY): Payer: Medicaid Other

## 2012-09-29 ENCOUNTER — Emergency Department (HOSPITAL_COMMUNITY)
Admission: EM | Admit: 2012-09-29 | Discharge: 2012-09-30 | Disposition: A | Discharge status: Home / Self Care | Payer: Medicaid Other | Attending: Emergency Medicine | Admitting: Emergency Medicine

## 2012-09-29 ENCOUNTER — Encounter (HOSPITAL_COMMUNITY): Payer: Self-pay | Admitting: Emergency Medicine

## 2012-09-29 DIAGNOSIS — R569 Unspecified convulsions: Secondary | ICD-10-CM

## 2012-09-29 DIAGNOSIS — Z87798 Personal history of other (corrected) congenital malformations: Secondary | ICD-10-CM | POA: Insufficient documentation

## 2012-09-29 DIAGNOSIS — J441 Chronic obstructive pulmonary disease with (acute) exacerbation: Secondary | ICD-10-CM

## 2012-09-29 DIAGNOSIS — F17200 Nicotine dependence, unspecified, uncomplicated: Secondary | ICD-10-CM | POA: Insufficient documentation

## 2012-09-29 DIAGNOSIS — Z8709 Personal history of other diseases of the respiratory system: Secondary | ICD-10-CM | POA: Insufficient documentation

## 2012-09-29 DIAGNOSIS — Z79899 Other long term (current) drug therapy: Secondary | ICD-10-CM | POA: Insufficient documentation

## 2012-09-29 DIAGNOSIS — Z7982 Long term (current) use of aspirin: Secondary | ICD-10-CM | POA: Insufficient documentation

## 2012-09-29 DIAGNOSIS — Z872 Personal history of diseases of the skin and subcutaneous tissue: Secondary | ICD-10-CM | POA: Insufficient documentation

## 2012-09-29 DIAGNOSIS — Z8742 Personal history of other diseases of the female genital tract: Secondary | ICD-10-CM | POA: Insufficient documentation

## 2012-09-29 DIAGNOSIS — IMO0002 Reserved for concepts with insufficient information to code with codable children: Secondary | ICD-10-CM | POA: Insufficient documentation

## 2012-09-29 DIAGNOSIS — Z8669 Personal history of other diseases of the nervous system and sense organs: Secondary | ICD-10-CM | POA: Insufficient documentation

## 2012-09-29 DIAGNOSIS — F329 Major depressive disorder, single episode, unspecified: Secondary | ICD-10-CM | POA: Insufficient documentation

## 2012-09-29 DIAGNOSIS — G40909 Epilepsy, unspecified, not intractable, without status epilepticus: Secondary | ICD-10-CM | POA: Insufficient documentation

## 2012-09-29 DIAGNOSIS — D509 Iron deficiency anemia, unspecified: Secondary | ICD-10-CM | POA: Insufficient documentation

## 2012-09-29 LAB — CBC WITH DIFFERENTIAL/PLATELET
Basophils Absolute: 0 10*3/uL (ref 0.0–0.1)
Basophils Relative: 0 % (ref 0–1)
Eosinophils Absolute: 0 10*3/uL (ref 0.0–0.7)
Eosinophils Relative: 0 % (ref 0–5)
HCT: 38.2 % (ref 36.0–46.0)
Hemoglobin: 13.6 g/dL (ref 12.0–15.0)
Lymphocytes Relative: 10 % — ABNORMAL LOW (ref 12–46)
Lymphs Abs: 0.5 10*3/uL — ABNORMAL LOW (ref 0.7–4.0)
MCH: 35.4 pg — ABNORMAL HIGH (ref 26.0–34.0)
MCHC: 35.6 g/dL (ref 30.0–36.0)
MCV: 99.5 fL (ref 78.0–100.0)
Monocytes Absolute: 0.1 10*3/uL (ref 0.1–1.0)
Monocytes Relative: 2 % — ABNORMAL LOW (ref 3–12)
Neutro Abs: 4.1 10*3/uL (ref 1.7–7.7)
Neutrophils Relative %: 88 % — ABNORMAL HIGH (ref 43–77)
Platelets: 124 10*3/uL — ABNORMAL LOW (ref 150–400)
RBC: 3.84 MIL/uL — ABNORMAL LOW (ref 3.87–5.11)
RDW: 13.4 % (ref 11.5–15.5)
WBC: 4.7 10*3/uL (ref 4.0–10.5)

## 2012-09-29 LAB — RAPID URINE DRUG SCREEN, HOSP PERFORMED
Amphetamines: NOT DETECTED
Barbiturates: NOT DETECTED
Benzodiazepines: NOT DETECTED
Cocaine: NOT DETECTED
Opiates: NOT DETECTED
Tetrahydrocannabinol: NOT DETECTED

## 2012-09-29 LAB — BASIC METABOLIC PANEL
BUN: 8 mg/dL (ref 6–23)
CO2: 32 mEq/L (ref 19–32)
Calcium: 8.8 mg/dL (ref 8.4–10.5)
Chloride: 100 mEq/L (ref 96–112)
Creatinine, Ser: 0.52 mg/dL (ref 0.50–1.10)
GFR calc Af Amer: >90 mL/min (ref >90)
GFR calc non Af Amer: >90 mL/min (ref >90)
Glucose, Bld: 180 mg/dL — ABNORMAL HIGH (ref 70–99)
Potassium: 4.3 mEq/L (ref 3.5–5.1)
Sodium: 140 mEq/L (ref 135–145)

## 2012-09-29 LAB — URINALYSIS, ROUTINE W REFLEX MICROSCOPIC
Bilirubin Urine: NEGATIVE
Glucose, UA: NEGATIVE mg/dL
Hgb urine dipstick: NEGATIVE
Ketones, ur: NEGATIVE mg/dL
Leukocytes, UA: NEGATIVE
Nitrite: NEGATIVE
Protein, ur: NEGATIVE mg/dL
Specific Gravity, Urine: 1.013 (ref 1.005–1.030)
Urobilinogen, UA: 1 mg/dL (ref 0.0–1.0)
pH: 7 (ref 5.0–8.0)

## 2012-09-29 LAB — ETHANOL: Alcohol, Ethyl (B): <11 mg/dL (ref 0–11)

## 2012-09-29 MED ORDER — DIPHENHYDRAMINE HCL 50 MG/ML IJ SOLN: 12.5000 mg | Freq: Once | INTRAMUSCULAR | Status: DC

## 2012-09-29 MED ORDER — SODIUM CHLORIDE 0.9 % IV SOLN
INTRAVENOUS | Status: DC
  Administered 2012-09-29: 16:00:00 via INTRAVENOUS

## 2012-09-29 MED ORDER — NICOTINE 21 MG/24HR TD PT24
21.0000 mg | MEDICATED_PATCH | Freq: Once | TRANSDERMAL | Status: DC
  Administered 2012-09-29: 21 mg via TRANSDERMAL
  Filled 2012-09-29: qty 1

## 2012-09-29 MED ORDER — METHYLPREDNISOLONE SODIUM SUCC 125 MG IJ SOLR
125.0000 mg | Freq: Once | INTRAMUSCULAR | Status: AC
  Administered 2012-09-29: 125 mg via INTRAVENOUS
  Filled 2012-09-29: qty 2

## 2012-09-29 NOTE — ED Notes (Signed)
Phlebotomy at bedside.

## 2012-09-29 NOTE — ED Notes (Signed)
Pt in from home via [facility], per EMS report pt called out for suspected seizure of unknown duration with hx of seizure, EMS reports pt agitated upon their arrival, GCS 15 in route, pt NSR, pt A&O x4, follows commands, speaks in complete sentences, no damage to tongue, pt non post-ictal upon arrival to ED, pt received 10 mg Albuterol & 0.5mg  Atrovent in route d/t c/o SOB with hx of COPD, O2 sat 98% RA

## 2012-09-29 NOTE — ED Provider Notes (Addendum)
History     CSN: [id number]  Arrival date & time 09/29/12  1527   First MD Initiated Contact with Patient 09/29/12 1540      Chief Complaint  Patient presents with  . Seizures    (Consider location/radiation/quality/duration/timing/severity/associated sxs/prior treatment) HPI Comments: Wanda Prince is a 57 y.o. Female who states that she felt funny, as if she would have a seizure, and then had a seizure. She called EMS to bring her in prior to the seizure. It is unknown how long she seized. EMS felt that she was postictal. She denies recent fever, chills, nausea, vomiting, weakness, or dizziness. She states that she is taking her usual medications. She has mild, cough, and shortness of breath, which is chronic.  There are no other known modifying factors  Patient is a 57 y.o. female presenting with seizures. The history is provided by the patient.  Seizures   Past Medical History  Diagnosis Date  . Meningitis 2009  . Hx of ventricular shunt 2009  . Seizure disorder   . 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 Suicide attempt     History of suicide attempts August 2006 and July 2006  . History of alcohol abuse   . History of cocaine abuse   . Major depressive disorder     History of  . History of iron deficiency   . COPD (chronic obstructive pulmonary disease)   . History of acute alcoholic hepatitis   . Psychiatric diagnosis     Multiple  . 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 Left breast mass     Past Surgical History  Procedure Laterality Date  . Craniotomy    . Breast mass removal      Family History  Problem Relation Age of Onset  . Coronary artery disease Father     History  Substance Use Topics  . Smoking status: Current Every Day Smoker -- 1.00 packs/day    Types: Cigarettes  . Smokeless  tobacco: Not on file  . Alcohol Use: Yes    OB History   Grav Para Term Preterm Abortions TAB SAB Ect Mult Living                  Review of Systems  Neurological: Positive for seizures.  All other systems reviewed and are negative.    Allergies  Review of patient's allergies indicates no known allergies.  Home Medications   Current Outpatient Rx  Name  Route  Sig  Dispense  Refill  . acetaminophen (TYLENOL) 500 MG tablet   Oral   Take 1,000 mg by mouth every 6 (six) hours as needed for pain.         Marland Kitchen albuterol (PROVENTIL HFA;VENTOLIN HFA) 108 (90 BASE) MCG/ACT inhaler   Inhalation   Inhale 2 puffs into the lungs 2 (two) times daily as needed for wheezing or shortness of breath.         Marland Kitchen aspirin 81 MG chewable tablet   Oral   Chew 81 mg by mouth daily.         Marland Kitchen b complex vitamins tablet   Oral   Take 1 tablet by mouth 2 (two) times daily.   62 tablet   0   . diclofenac sodium (VOLTAREN) 1 % GEL   Topical  Apply 4 g topically 4 (four) times daily as needed (for muscle pain).         . Fluticasone-Salmeterol (ADVAIR) 250-50 MCG/DOSE AEPB   Inhalation   Inhale 1 puff into the lungs every 12 (twelve) hours.   60 each   6   . folic acid (FOLVITE) 1 MG tablet   Oral   Take 1 mg by mouth daily.         Marland Kitchen ibuprofen (ADVIL,MOTRIN) 200 MG tablet   Oral   Take 400 mg by mouth daily as needed for pain.         Marland Kitchen levETIRAcetam (KEPPRA) 500 MG tablet   Oral   Take 1 tablet (500 mg total) by mouth 2 (two) times daily.   62 tablet   0   . lisinopril (PRINIVIL,ZESTRIL) 10 MG tablet   Oral   Take 10 mg by mouth daily.         . metoprolol succinate (TOPROL-XL) 50 MG 24 hr tablet   Oral   Take 50 mg by mouth 2 (two) times daily. Take with or immediately following a meal.         . Multiple Vitamin (MULITIVITAMIN WITH MINERALS) TABS   Oral   Take 1 tablet by mouth daily.   30 tablet   6   . phenytoin (DILANTIN) 100 MG ER capsule   Oral    Take 200 mg by mouth 2 (two) times daily.         . simvastatin (ZOCOR) 20 MG tablet   Oral   Take 20 mg by mouth every evening.         . thiamine 100 MG tablet   Oral   Take 1 tablet (100 mg total) by mouth daily.   30 tablet   6   . tiotropium (SPIRIVA) 18 MCG inhalation capsule   Inhalation   Place 1 capsule (18 mcg total) into inhaler and inhale daily.   30 capsule   6   . predniSONE (DELTASONE) 20 MG tablet   Oral   Take 1 tablet (20 mg total) by mouth 2 (two) times daily.   10 tablet   0   . Vitamin D, Ergocalciferol, (DRISDOL) 50000 UNITS CAPS   Oral   Take 50,000 Units by mouth every 7 (seven) days. On Mondays           BP 118/80  Pulse 81  Temp(Src) 97.8 F (36.6 C) (Oral)  Resp 22  Ht 5\' 8"  (1.727 m)  Wt 135 lb (61.236 kg)  BMI 20.53 kg/m2  SpO2 95%  Physical Exam  Nursing note and vitals reviewed. Constitutional: She is oriented to person, place, and time. She appears well-developed and well-nourished.  HENT:  Head: Normocephalic and atraumatic.  Eyes: Conjunctivae and EOM are normal. Pupils are equal, round, and reactive to light.  Neck: Normal range of motion and phonation normal. Neck supple.  Cardiovascular: Normal rate, regular rhythm and intact distal pulses.   Pulmonary/Chest: Effort normal and breath sounds normal. She exhibits no tenderness.  Abdominal: Soft. She exhibits no distension. There is no tenderness. There is no guarding.  Musculoskeletal: Normal range of motion.  Neurological: She is alert and oriented to person, place, and time. She has normal strength. She exhibits normal muscle tone.  Skin: Skin is warm and dry.  Psychiatric: She has a normal mood and affect. Her behavior is normal. Judgment and thought content normal.    ED Course  Procedures (including critical care  time)  Medications  methylPREDNISolone sodium succinate (SOLU-MEDROL) 125 mg/2 mL injection 125 mg (125 mg Intravenous Given 09/29/12 1611)     Patient Vitals for the past 24 hrs:  BP Temp Pulse Resp SpO2  09/30/12 0214 118/80 mmHg 97.8 F (36.6 C) 81 22 95 %  09/30/12 0052 100/86 mmHg - - 28 97 %  09/29/12 2311 - - 80 24 98 %  09/29/12 2306 138/75 mmHg - 87 27 91 %  09/29/12 2215 115/70 mmHg - 76 22 97 %  09/29/12 2200 117/71 mmHg - 76 21 98 %  09/29/12 2145 107/70 mmHg - 80 24 97 %  09/29/12 2138 114/71 mmHg - 80 26 97 %  09/29/12 2030 115/68 mmHg - 73 17 100 %  09/29/12 1930 107/57 mmHg - 76 28 100 %  09/29/12 1900 117/69 mmHg - 80 20 97 %   Delay in Care to get Labs. I did a right femoral stick. Dilantin is a sendout; initially ordered at 1600, not yet returned at 01:15  1:21 AM Reevaluation with update and discussion. After initial assessment and treatment, an updated evaluation reveals No seizure in ED. Tolerated oral food/fluid in ED. Kaydee Magel L   Labs Reviewed  CBC WITH DIFFERENTIAL - Abnormal; Notable for the following:    RBC 3.84 (*)    MCH 35.4 (*)    Platelets 124 (*)    Neutrophils Relative % 88 (*)    Lymphocytes Relative 10 (*)    Monocytes Relative 2 (*)    Lymphs Abs 0.5 (*)    All other components within normal limits  BASIC METABOLIC PANEL - Abnormal; Notable for the following:    Glucose, Bld 180 (*)    All other components within normal limits  PHENYTOIN LEVEL, TOTAL - Abnormal; Notable for the following:    Phenytoin Lvl 28.5 (*)    All other components within normal limits  URINALYSIS, ROUTINE W REFLEX MICROSCOPIC  URINE RAPID DRUG SCREEN (HOSP PERFORMED)  ETHANOL   Dg Chest Portable 1 View  09/29/2012   *RADIOLOGY REPORT*  Clinical Data: Seizures, shortness of breath  PORTABLE CHEST - 1 VIEW  Comparison: 02/28/2012  Findings: Cardiomediastinal silhouette is stable.  Right VP shunt catheter is noted.  No acute infiltrate or pleural effusion.  No pulmonary edema.  IMPRESSION: No active disease.  No significant change.   Original Report Authenticated By: Natasha Mead, M.D.     1.  Seizure   2. COPD exacerbation       MDM  Reported seizure. With normal metabolic status.  No evidence for infection as source. COPD exacerbation with ongoing smoking. Doubt metabolic instability, serious bacterial infection or impending vascular collapse; the patient is stable for discharge.  Nursing Notes Reviewed/ Care Coordinated Applicable Imaging Reviewed Interpretation of Laboratory Data incorporated into ED treatment   Plan: Home Medications- Prednisone, Albuterol; Home Treatments- Stop smoking; Recommended follow up- PCP check up in 1 week     Flint Melter, MD 09/30/12 1734  Pt was D/Ced by Dr. Nicanor Alcon after return of the Dilantin level.    Flint Melter, MD 09/30/12 [phone]

## 2012-09-29 NOTE — ED Notes (Signed)
Lab contacted regarding Phenytoin level, free. Dr. Effie Shy and Patient updated on delay.

## 2012-09-29 NOTE — ED Notes (Signed)
Pt SOB ambulating to bathroom. Pt able to lower RR with deep breathing and rest. 2L 

## 2012-09-29 NOTE — ED Notes (Signed)
Meal given with permission from Dr. Effie Shy

## 2012-09-30 LAB — PHENYTOIN LEVEL, TOTAL: Phenytoin Lvl: 28.5 ug/mL — ABNORMAL HIGH (ref 10.0–20.0)

## 2012-09-30 MED ORDER — ALBUTEROL SULFATE HFA 108 (90 BASE) MCG/ACT IN AERS
1.0000 | INHALATION_SPRAY | RESPIRATORY_TRACT | Status: DC
  Administered 2012-09-30: 2 via RESPIRATORY_TRACT
  Filled 2012-09-30: qty 6.7

## 2012-09-30 MED ORDER — PREDNISONE 20 MG PO TABS: 20.0000 mg | ORAL_TABLET | Freq: Two times a day (BID) | ORAL | Status: DC

## 2012-09-30 NOTE — ED Notes (Signed)
Charge RN contacted Lab, lab states will call back with results.

## 2012-09-30 NOTE — ED Notes (Signed)
PT comfortable with d/c and f/u instructions. Prescriptions x1 

## 2012-09-30 NOTE — ED Notes (Signed)
Pt c/o increased SOB, requesting Albuterol Inhaler, states typically uses one at home. EDP aware

## 2012-09-30 NOTE — ED Notes (Signed)
This RN spoke to lab, spoke to [place]. States will call back in 10 minutes with results

## 2012-09-30 NOTE — ED Notes (Signed)
Lab contacted regarding Phenytoin level, states will be approx 1hour. Dr. Effie Shy made aware

## 2012-10-02 IMAGING — CR DG ABDOMEN 1V
1 series · 1 of 1 positions shown · non-contrast
Comparison: Single view abdomen 01/11/2010.

CLINICAL DATA: Recent seizure.  Evaluate shunt tubing.

ABDOMEN - 1 VIEW

[t abdomen supine]
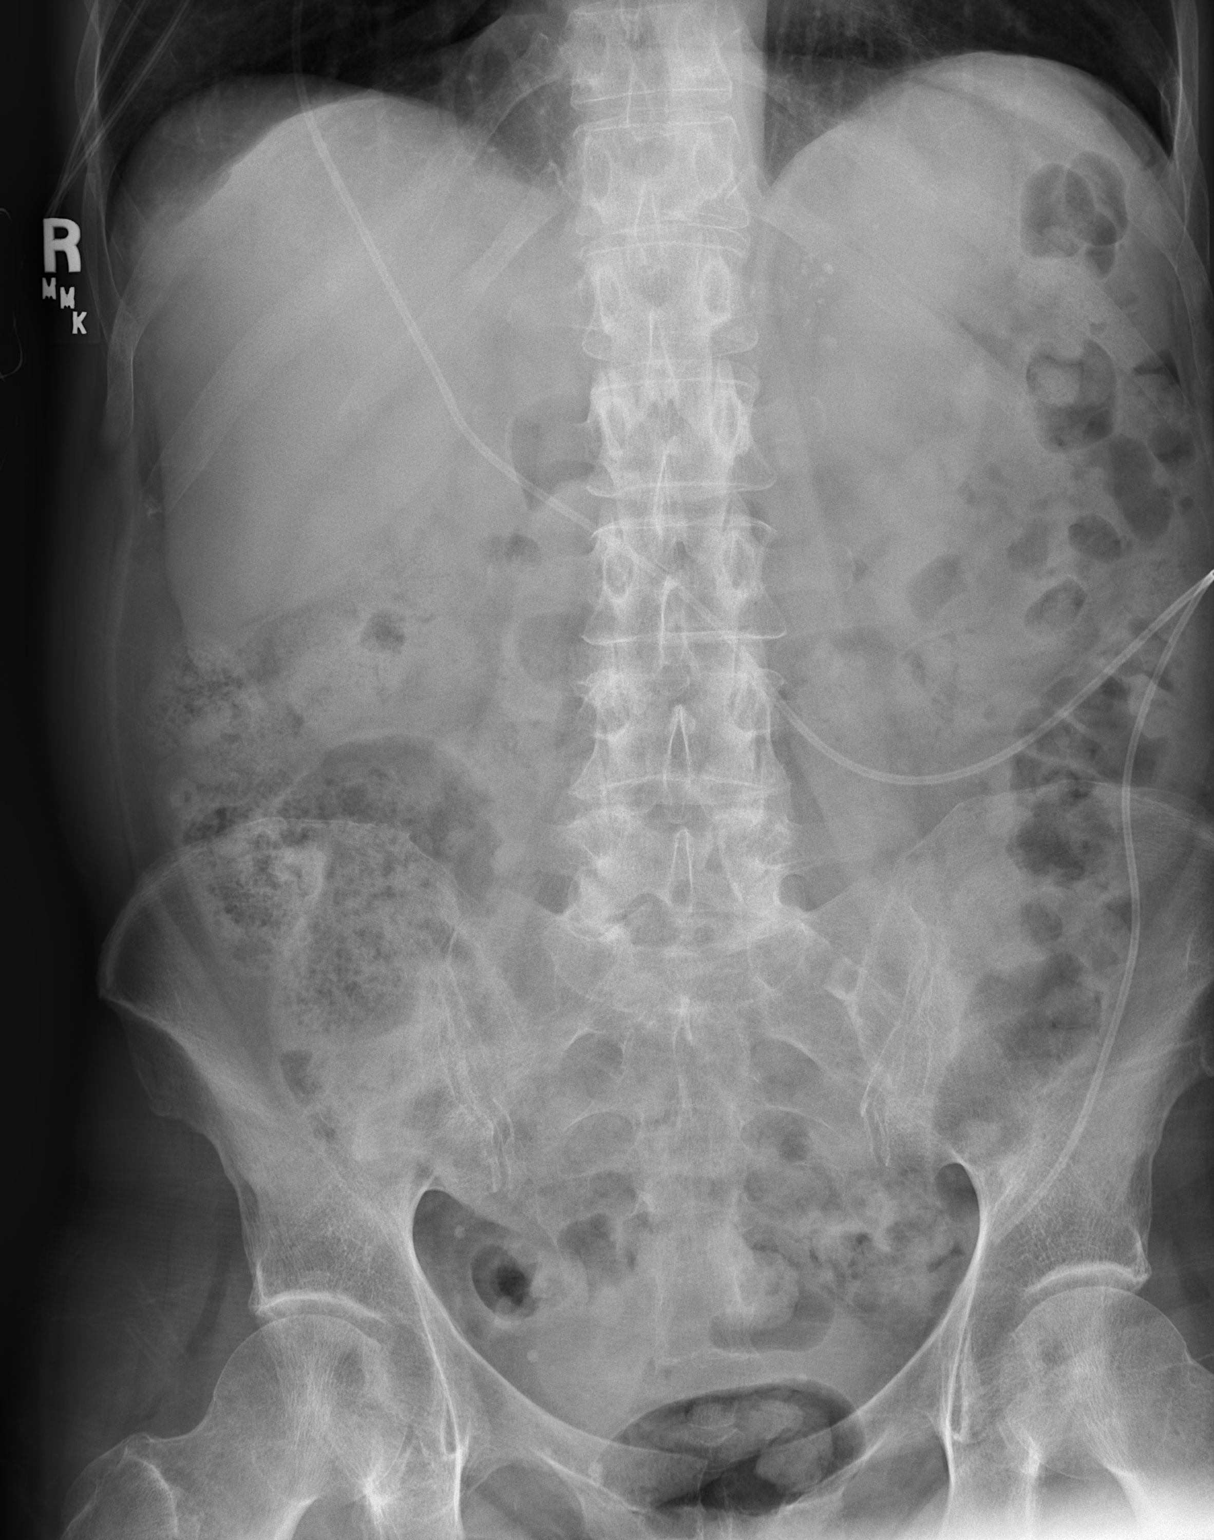

[1 of 1 positions shown; findings below may reference images not displayed]

FINDINGS: The most left lateral aspect of the shunt tubing is not
included on this view.  The tube is otherwise intact and extends in
the abdomen.  Bowel gas pattern is unremarkable.  Degenerative
changes are noted in the lower lumbar spine.
IMPRESSION: 1.  The visualized shunt tubing is intact.
2.  A small portion of the most lateral aspect of the tubing is not
visualized.
3.  Normal bowel gas pattern.

## 2012-10-02 IMAGING — CR DG SKULL COMPLETE 4+V
2 series · 2 of 2 positions shown · non-contrast
Comparison: Head CT dated 06/03/2011.

CLINICAL DATA: Recent seizure.  Severe headache.

SKULL - COMPLETE 4 + VIEW

[t skull ap]
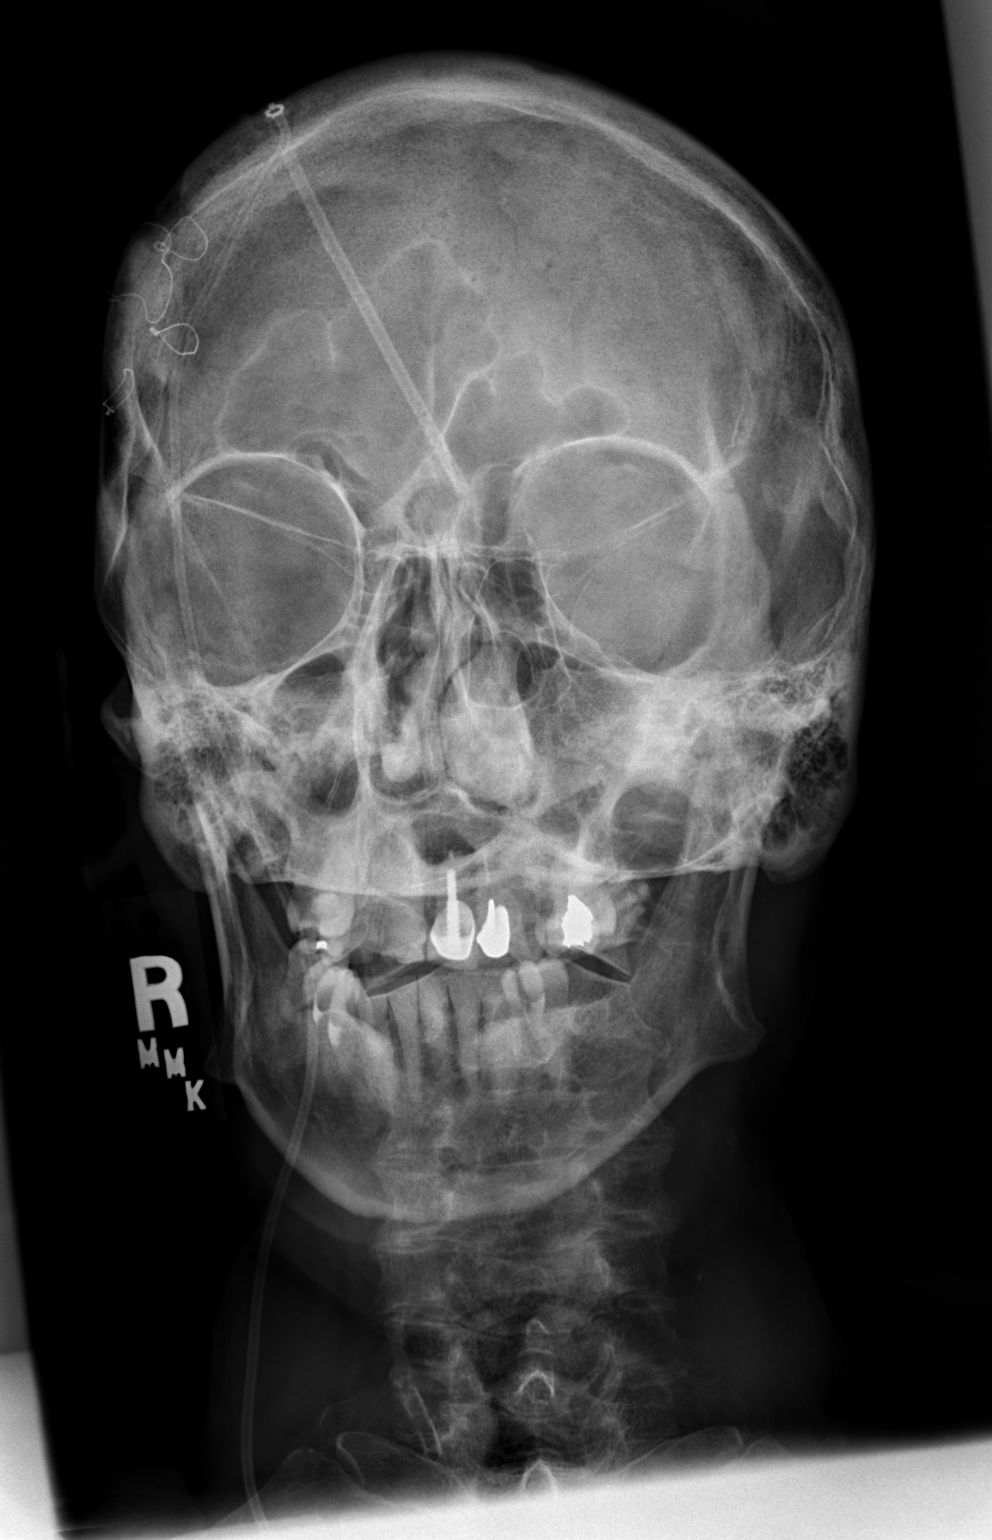

[t skull lat]
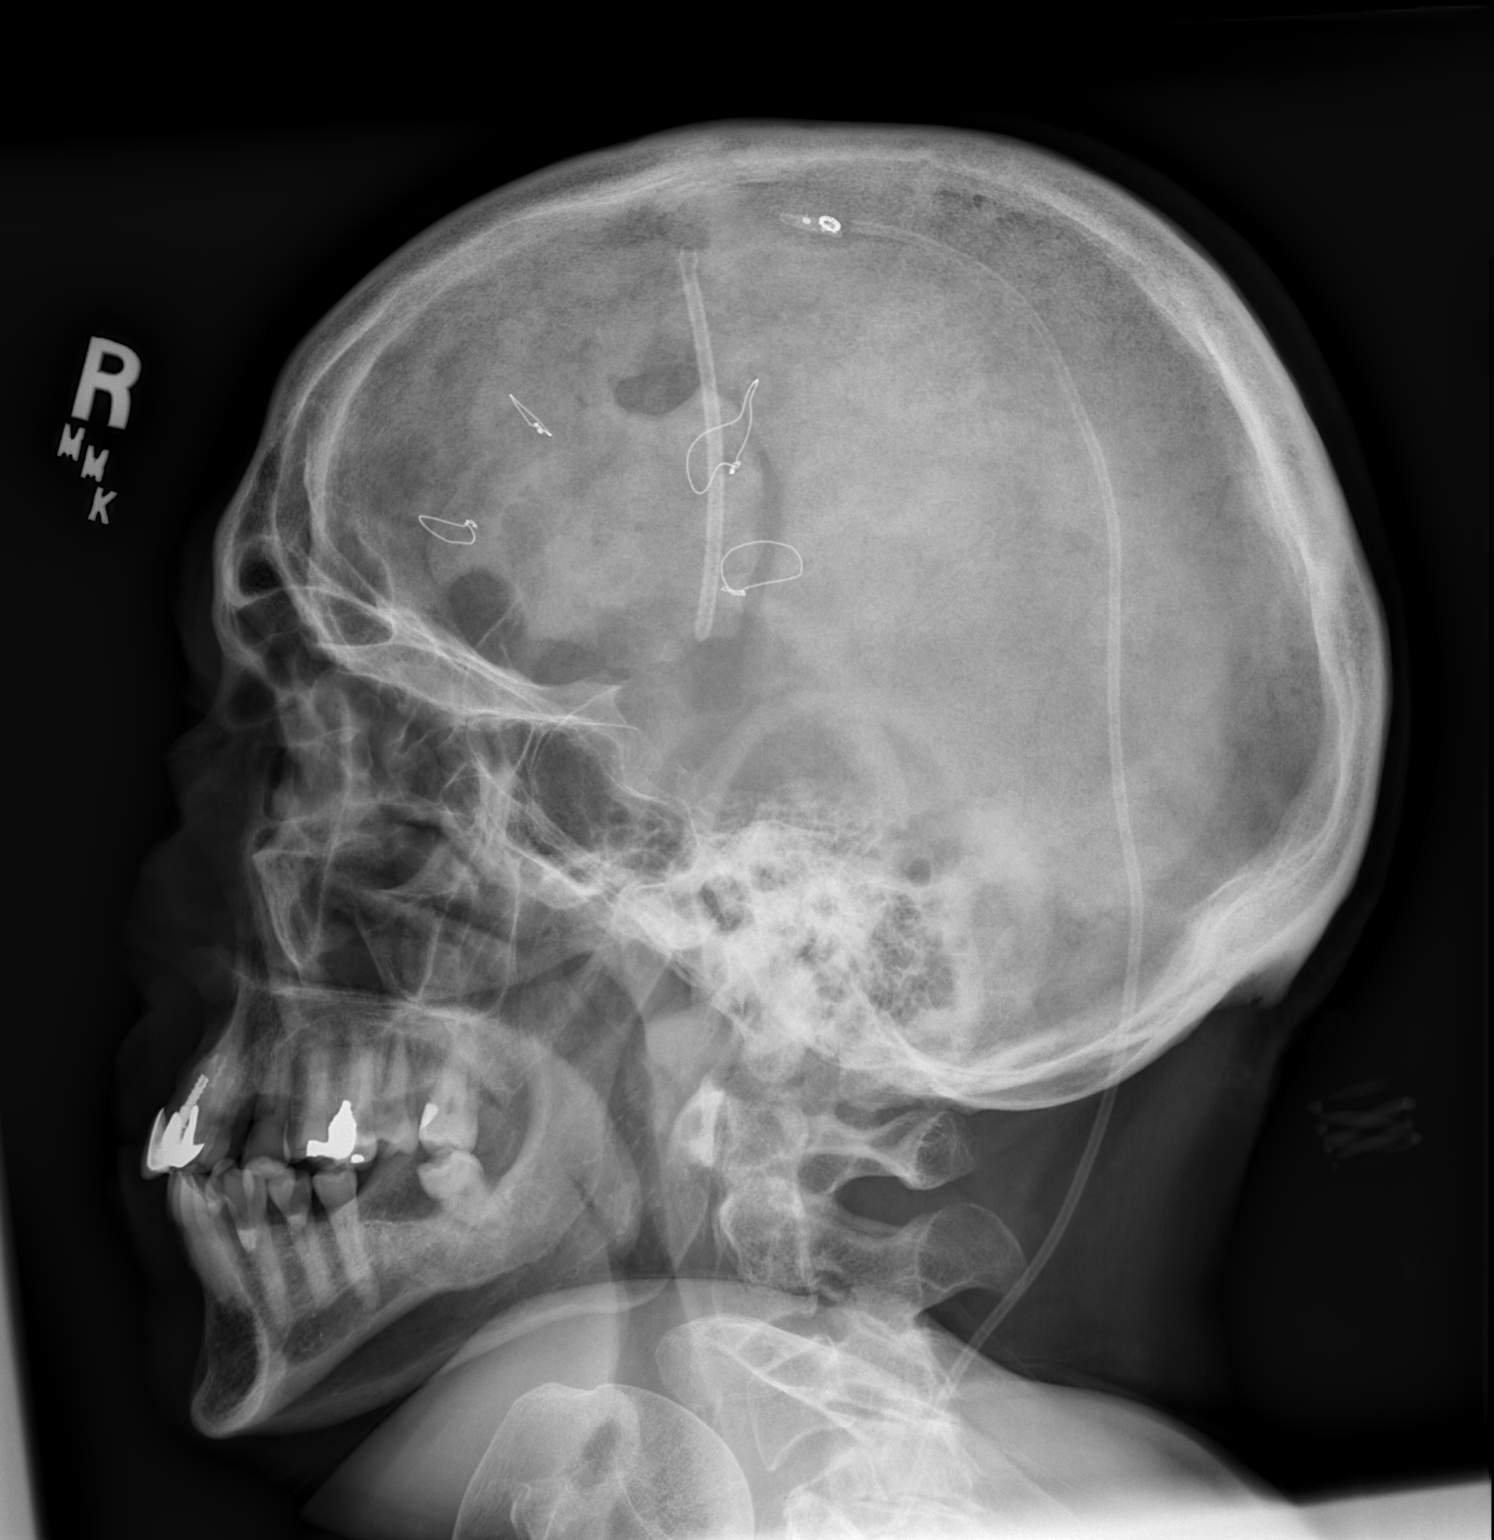

[2 of 2 positions shown; findings below may reference images not displayed]

FINDINGS: The right frontal ventriculoperitoneal shunt catheter
tubing appears intact. Right frontal post craniotomy changes are
again demonstrated.
IMPRESSION: No acute abnormality.

## 2012-10-02 IMAGING — CR DG CHEST 1V
1 series · 1 of 1 positions shown · non-contrast
Comparison: None.

CLINICAL DATA: Seizure.  Check shunt.

CHEST - 1 VIEW

[t chest supine]
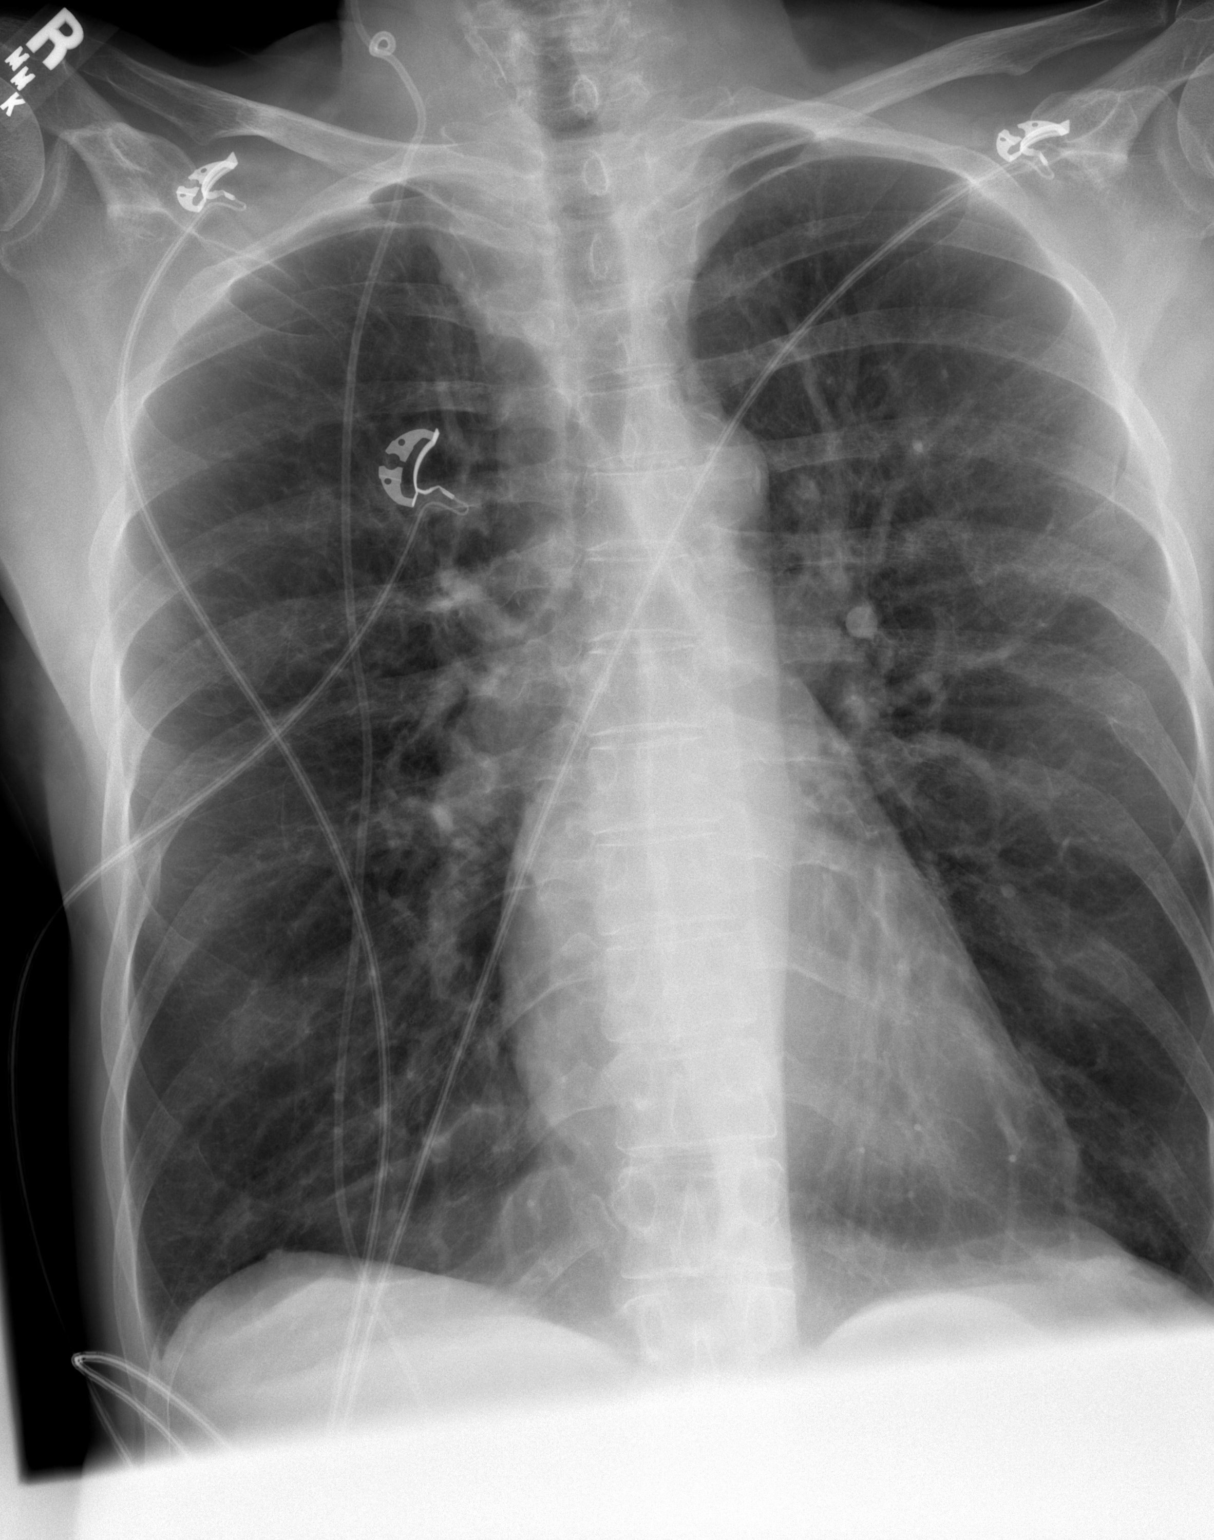

[1 of 1 positions shown; findings below may reference images not displayed]

FINDINGS: The visualized portions of the shunt tubing are intact.
Remote bilateral rib fractures are again seen.  Chronic
interstitial coarsening is stable.
IMPRESSION: 1.  The ventriculostomy shunt tubing is intact.
2.  No acute cardiopulmonary disease.

## 2012-11-23 IMAGING — CT CT HEAD W/O CM
1 series · 15 of 30 positions shown, 19 images · non-contrast
Comparison: 01/26/2012

CLINICAL DATA: Seizures.  Slurred speech.  Decreased motor
function.

CT HEAD WITHOUT CONTRAST
TECHNIQUE: Contiguous axial images were obtained from the base of
the skull through the vertex without contrast.

[Series 2: head routine 4.8 h37s · axial · 0.40mm/px · z∈[+1358,+1510]mm · 15 of 36 slices shown, 19 images]
[im 2/36  brain]
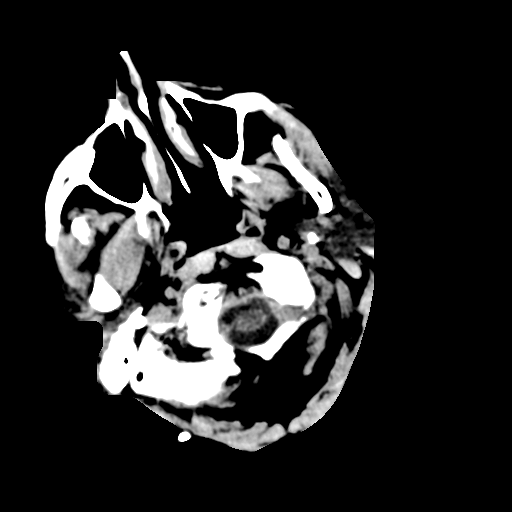
[im 2/36  bone]
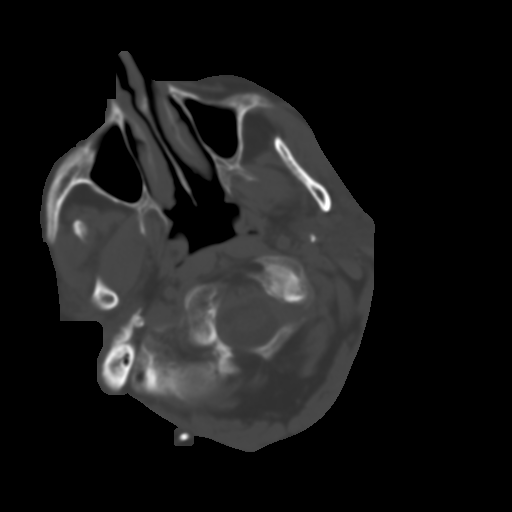
[im 4/36  brain]
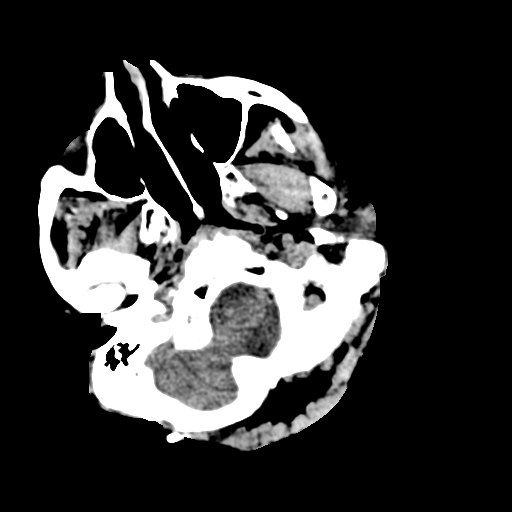
[im 7/36  brain]
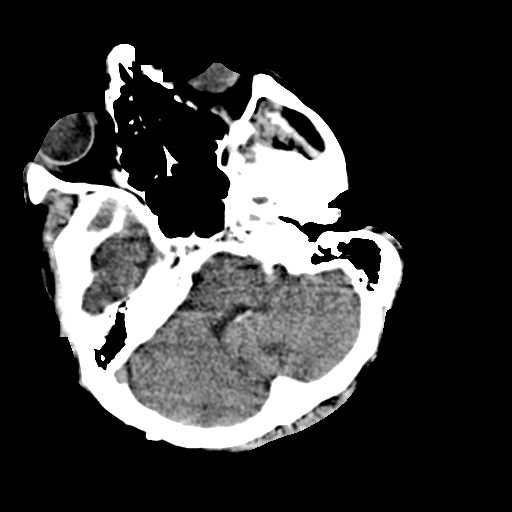
[im 9/36  brain]
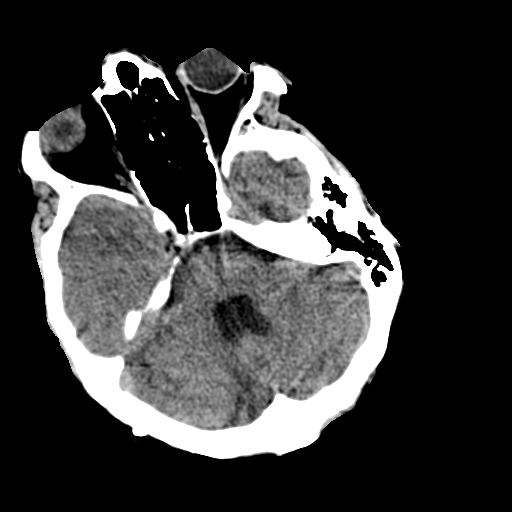
[im 11/36  brain]
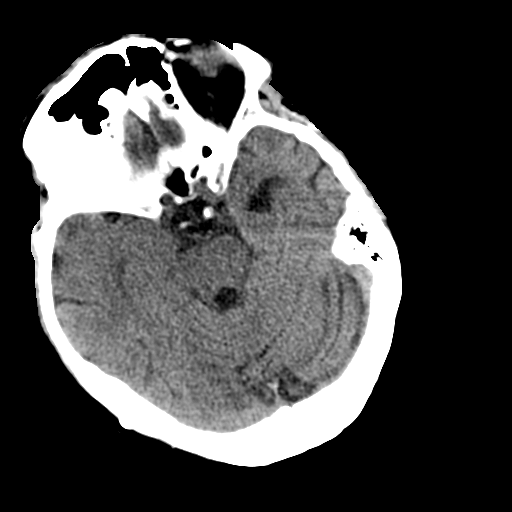
[im 11/36  bone]
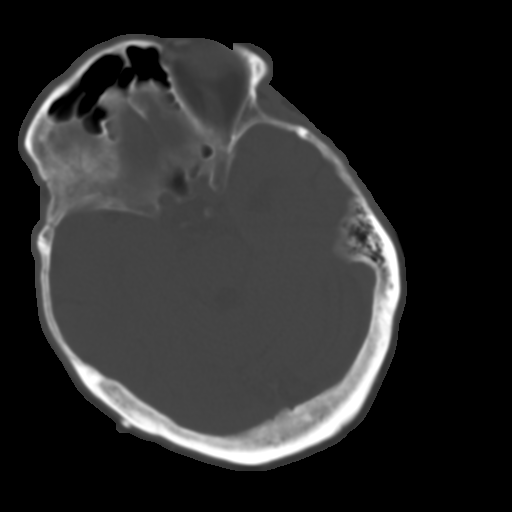
[im 14/36  brain]
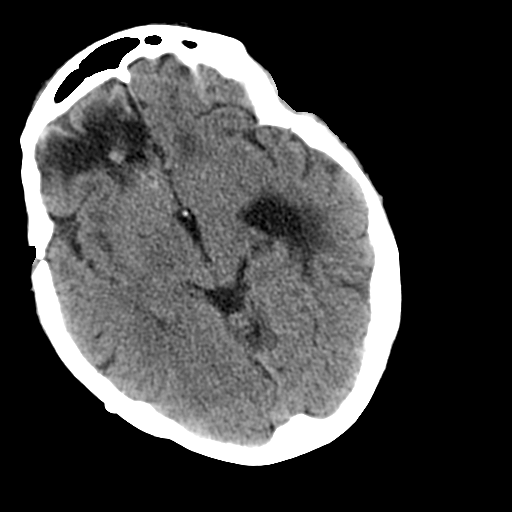
[im 16/36  brain]
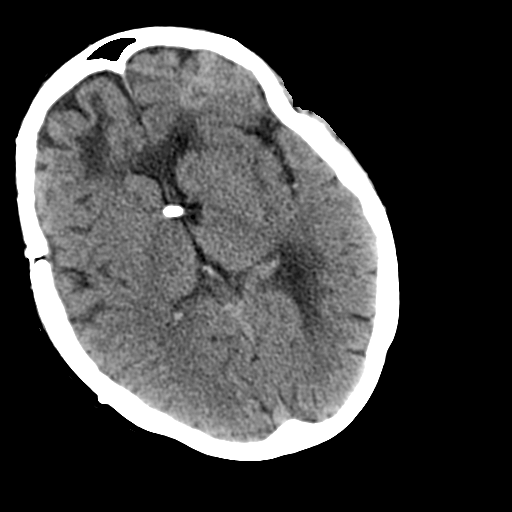
[im 19/36  brain]
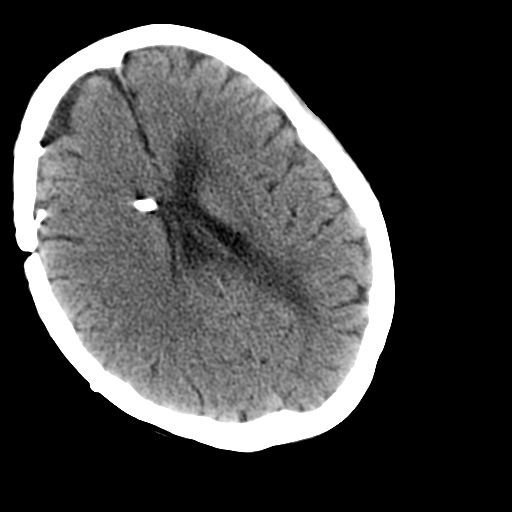
[im 20/36  brain]
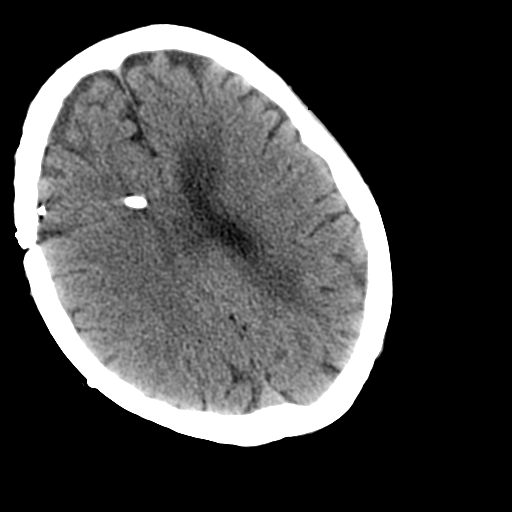
[im 20/36  bone]
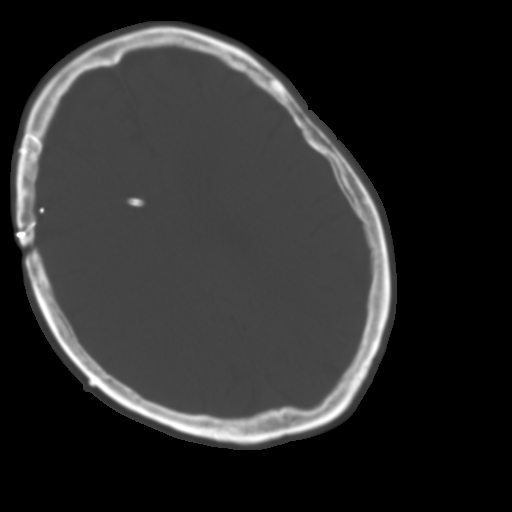
[im 22/36  brain]
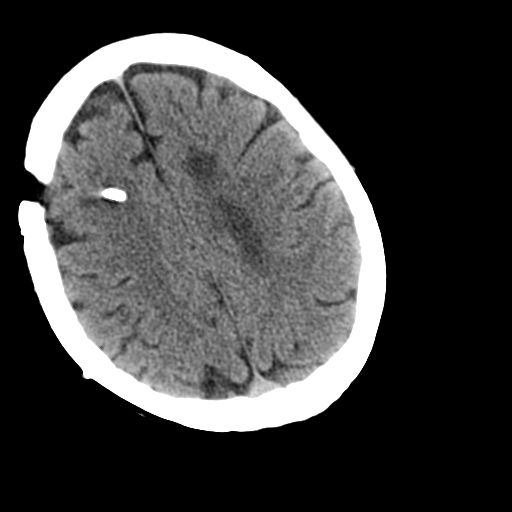
[im 25/36  brain]
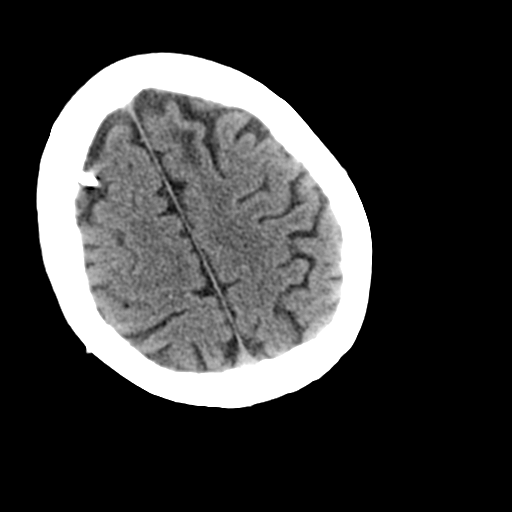
[im 27/36  brain]
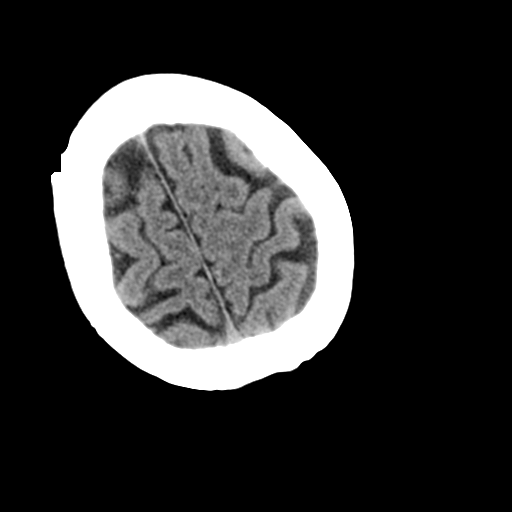
[im 29/36  brain]
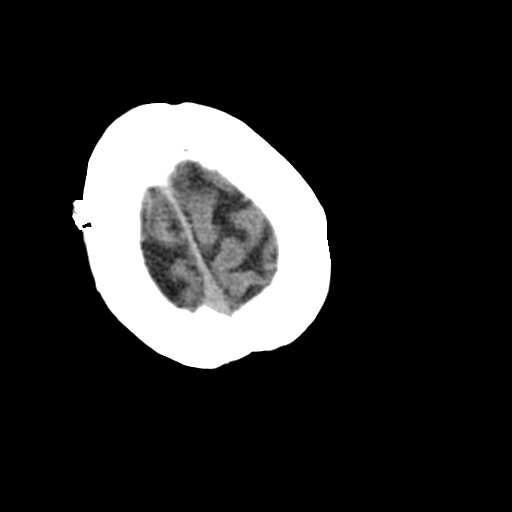
[im 29/36  bone]
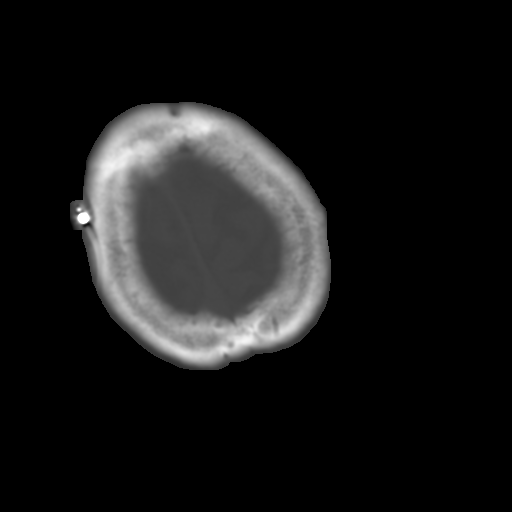
[im 32/36  brain]
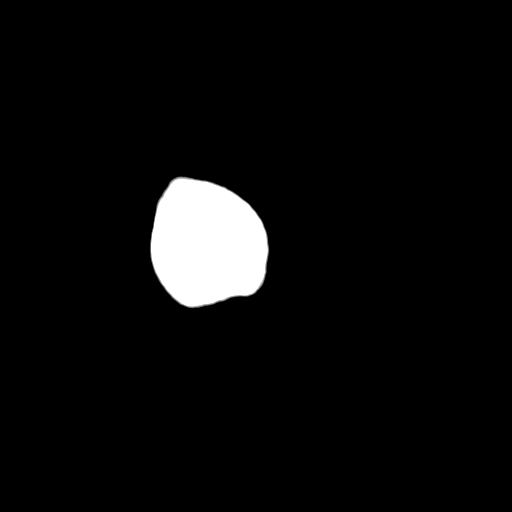
[im 34/36  brain]
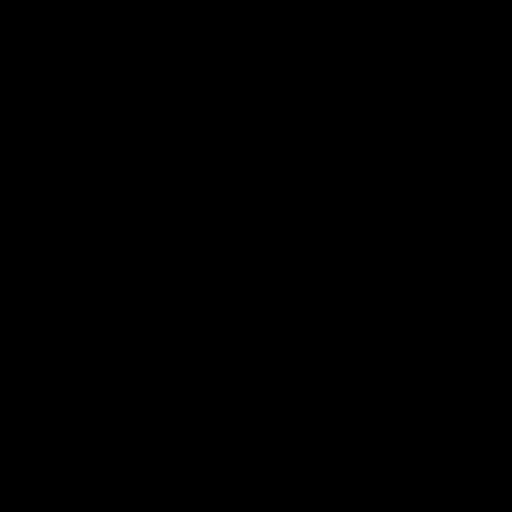

[15 of 30 positions shown; findings below may reference images not displayed]

FINDINGS: Postoperative changes with previous right frontal
craniotomy. Right trans frontal ventricular shunt tubing with tip
in the third ventricle.  The right ventricle is decompressed.
There is dilatation of the left ventricle which appears stable
since previous study.  Focal area of encephalomalacia in the right
anterior frontal lobe and less prominently in the left anterior
frontal lobe.  These changes appear stable since the previous
study.  There is a suggestion of focal mass lesion centrally in
both frontal lobes, more prominent than on previous study.
Developing metastatic disease should be excluded and MRI is
recommended for further evaluation.  There is no significant mass
effect or midline shift.  No acute intracranial hemorrhage is
visualized.  No abnormal extra-axial fluid collections.  Visualized
paranasal sinuses and mastoid air cells are not opacified.
IMPRESSION: Encephalomalacia in the anterior frontal lobes bilaterally appears
stable but with suggestion of developing mass lesions.  MRI
recommended for further evaluation.

## 2012-11-23 IMAGING — CR DG SKULL 1-3V
1 series · 1 of 1 positions shown · non-contrast
Comparison: CT performed earlier today.  Plain films 01/07/2012.

CLINICAL DATA: Shunt series.

SKULL - 1-3 VIEW

[t skull a.p./p.a.]
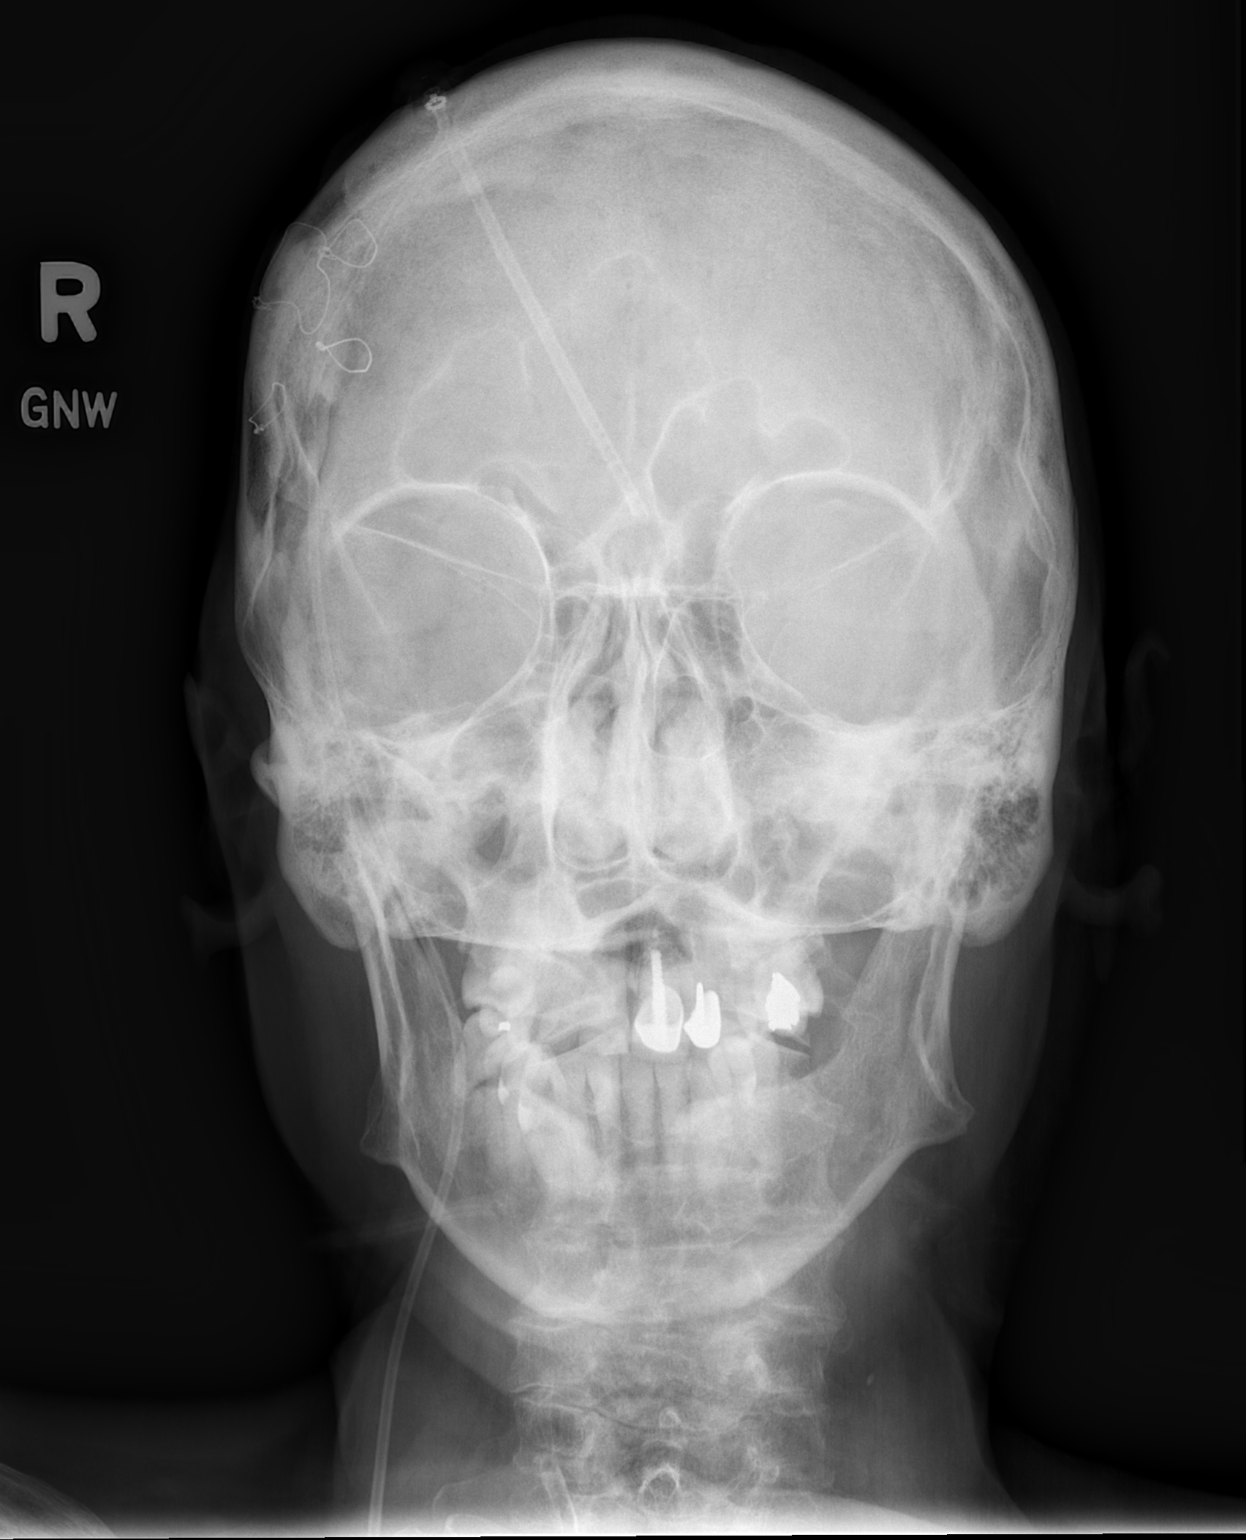

[1 of 1 positions shown; findings below may reference images not displayed]

FINDINGS: Shunt catheter has a stable coarsened appearance.
Catheter appears intact.  No acute bony abnormality.
IMPRESSION: Stable appearance of the shunt catheter.

## 2012-11-23 IMAGING — CR DG CHEST 1V
1 series · 1 of 1 positions shown · non-contrast
Comparison: 01/07/2012

CLINICAL DATA: Shunt series.

CHEST - 1 VIEW

[t chest supine]
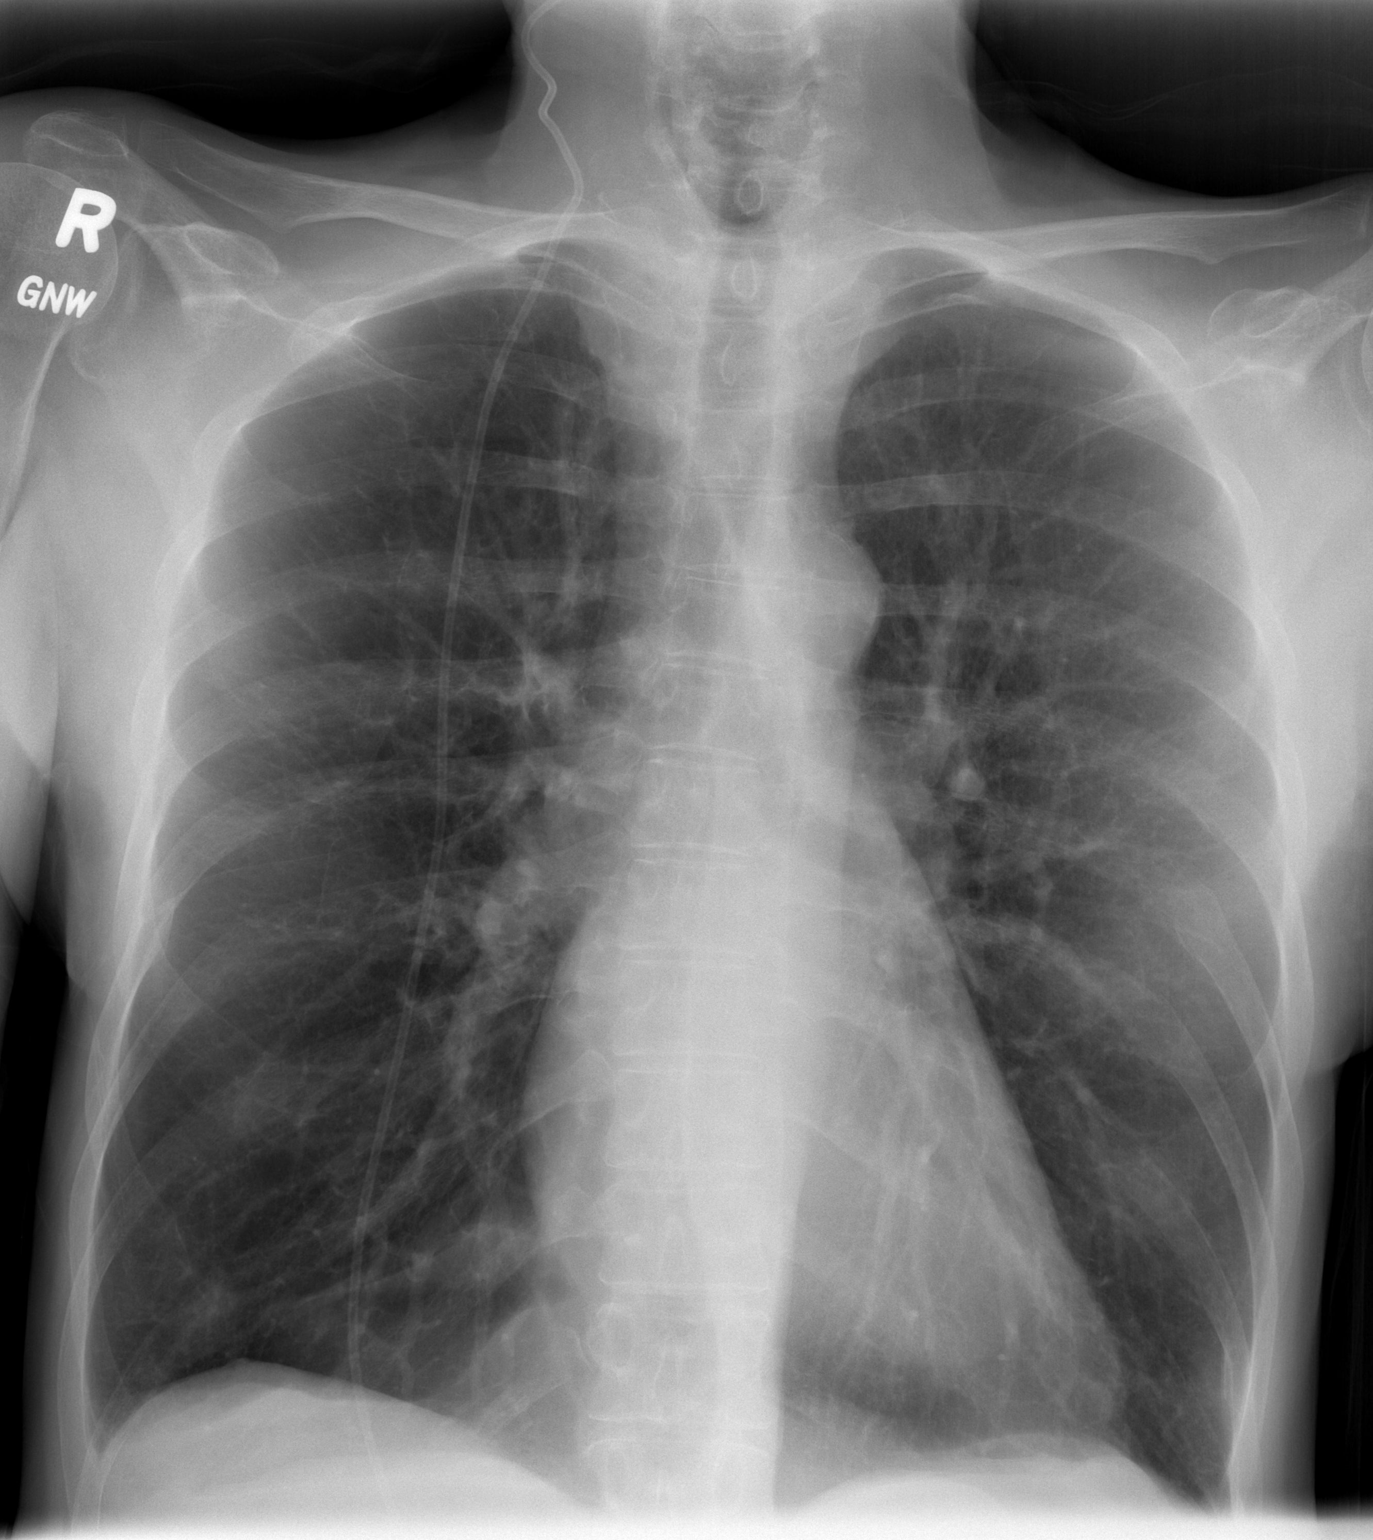

[1 of 1 positions shown; findings below may reference images not displayed]

FINDINGS: Shunt catheter noted in the right chest wall.  Stable
appearance.  Catheter is intact.

There is hyperinflation of the lungs compatible with COPD.  Heart
is upper limits normal in size.  Lungs are clear.  No effusions or
acute bony abnormality.
IMPRESSION: Shunt catheter intact.

No acute cardiopulmonary disease.

## 2012-11-23 IMAGING — CR DG ABDOMEN 1V
1 series · 1 of 1 positions shown · non-contrast
Comparison: 01/07/2012

CLINICAL DATA: Shunt series.

ABDOMEN - 1 VIEW

[t abdomen supine]
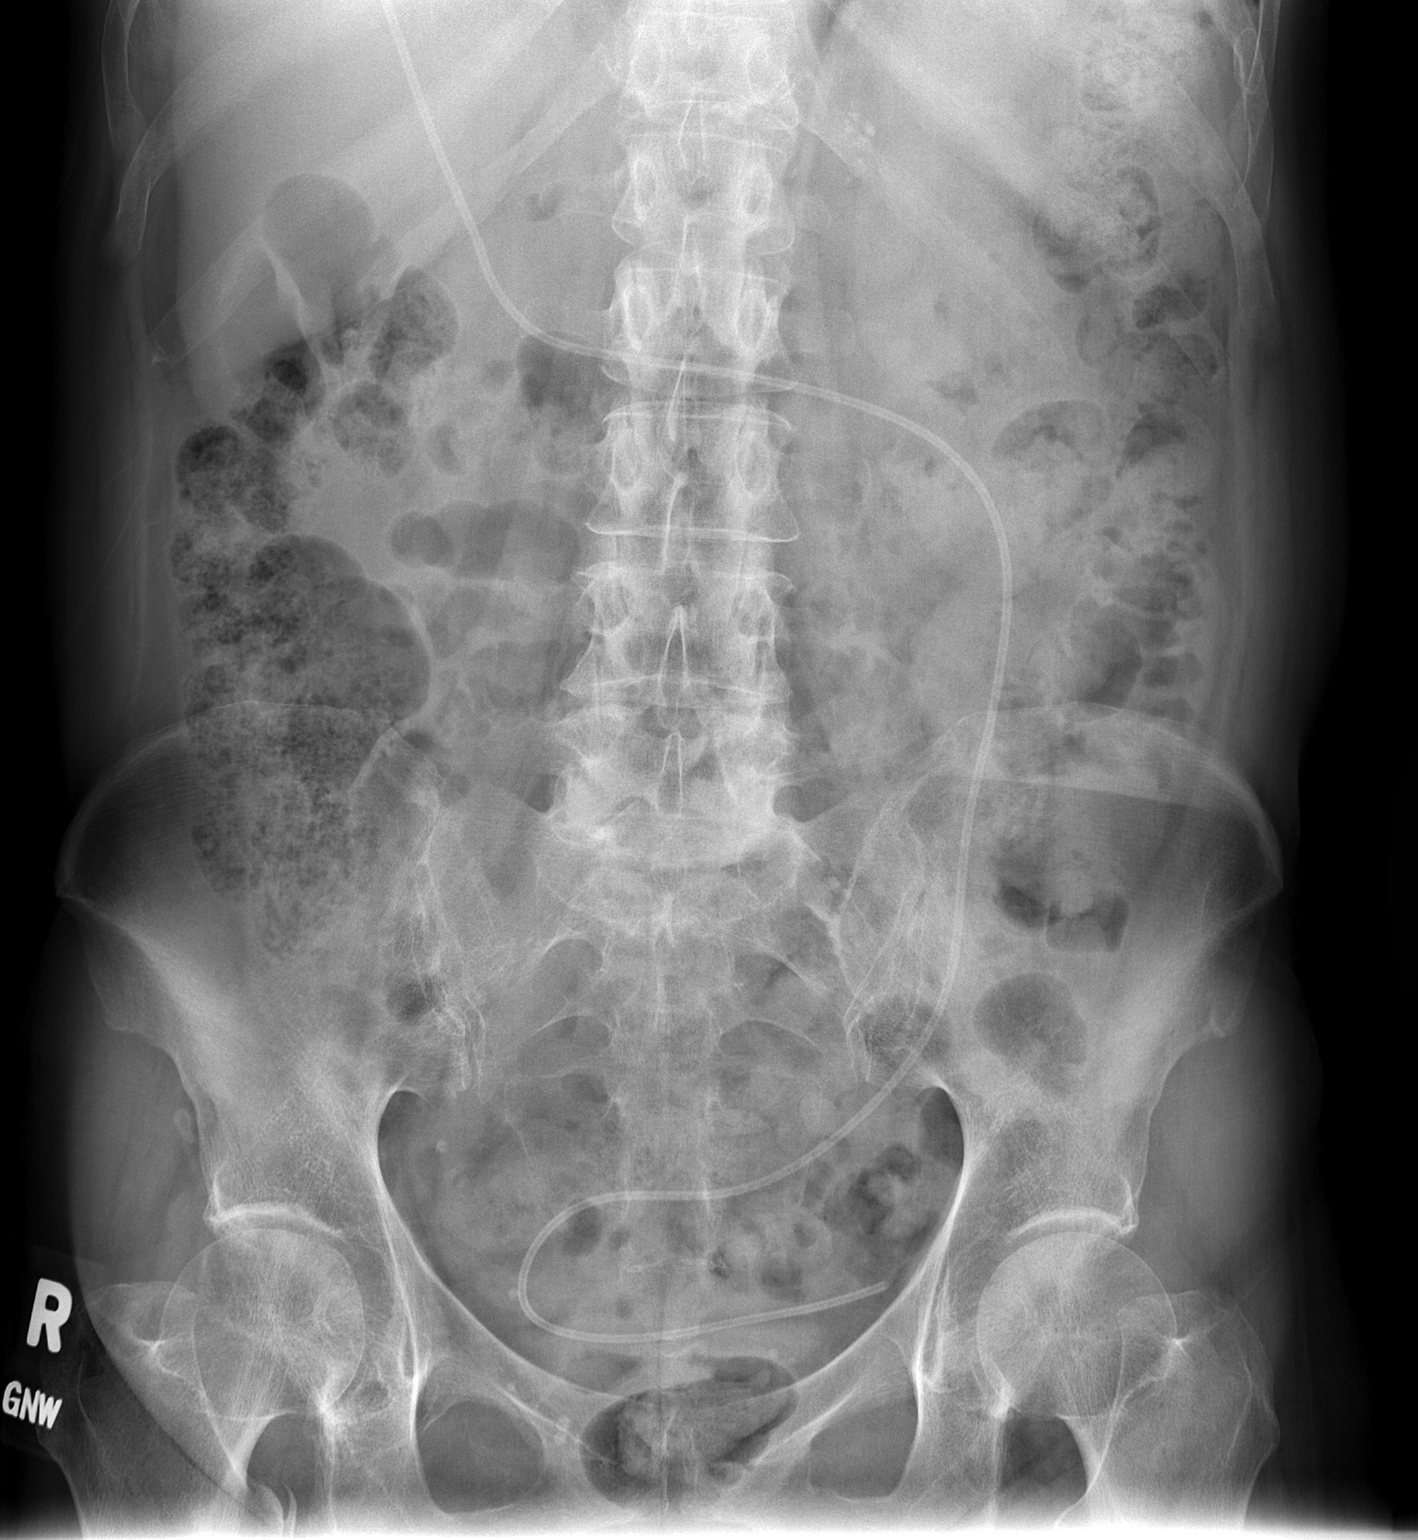

[1 of 1 positions shown; findings below may reference images not displayed]

FINDINGS: The shunt catheter is seen in the abdomen pelvis with the
tip in the left lower pelvis.  Visualized catheter intact.
Nonobstructive bowel gas pattern.  Moderate stool burden throughout
the colon.  No organomegaly or free air.  No acute bony
abnormality.
IMPRESSION: Shunt catheter intact.

Moderate stool burden.

## 2012-12-08 ENCOUNTER — Other Ambulatory Visit: Payer: Self-pay | Admitting: Internal Medicine

## 2012-12-08 DIAGNOSIS — B1920 Unspecified viral hepatitis C without hepatic coma: Secondary | ICD-10-CM

## 2012-12-15 ENCOUNTER — Ambulatory Visit
Admission: RE | Admit: 2012-12-15 | Discharge: 2012-12-15 | Disposition: A | Discharge status: Home / Self Care | Payer: Medicaid Other | Source: Ambulatory Visit | Attending: Internal Medicine | Admitting: Internal Medicine

## 2012-12-15 DIAGNOSIS — B1920 Unspecified viral hepatitis C without hepatic coma: Secondary | ICD-10-CM

## 2013-02-06 ENCOUNTER — Other Ambulatory Visit: Payer: Self-pay | Admitting: Neurology

## 2013-03-03 ENCOUNTER — Other Ambulatory Visit: Payer: Self-pay | Admitting: Neurology

## 2013-03-08 NOTE — Telephone Encounter (Signed)
I called [facility] and we were able to reschedule her appointment with Dr. Terrace Arabia for [phone]

## 2013-05-21 ENCOUNTER — Ambulatory Visit: Payer: Self-pay | Admitting: Neurology

## 2013-06-25 IMAGING — CR DG CHEST 1V PORT
1 series · 1 of 1 positions shown · non-contrast
Comparison: 02/28/2012

CLINICAL DATA: Seizures, shortness of breath

PORTABLE CHEST - 1 VIEW

[AP]
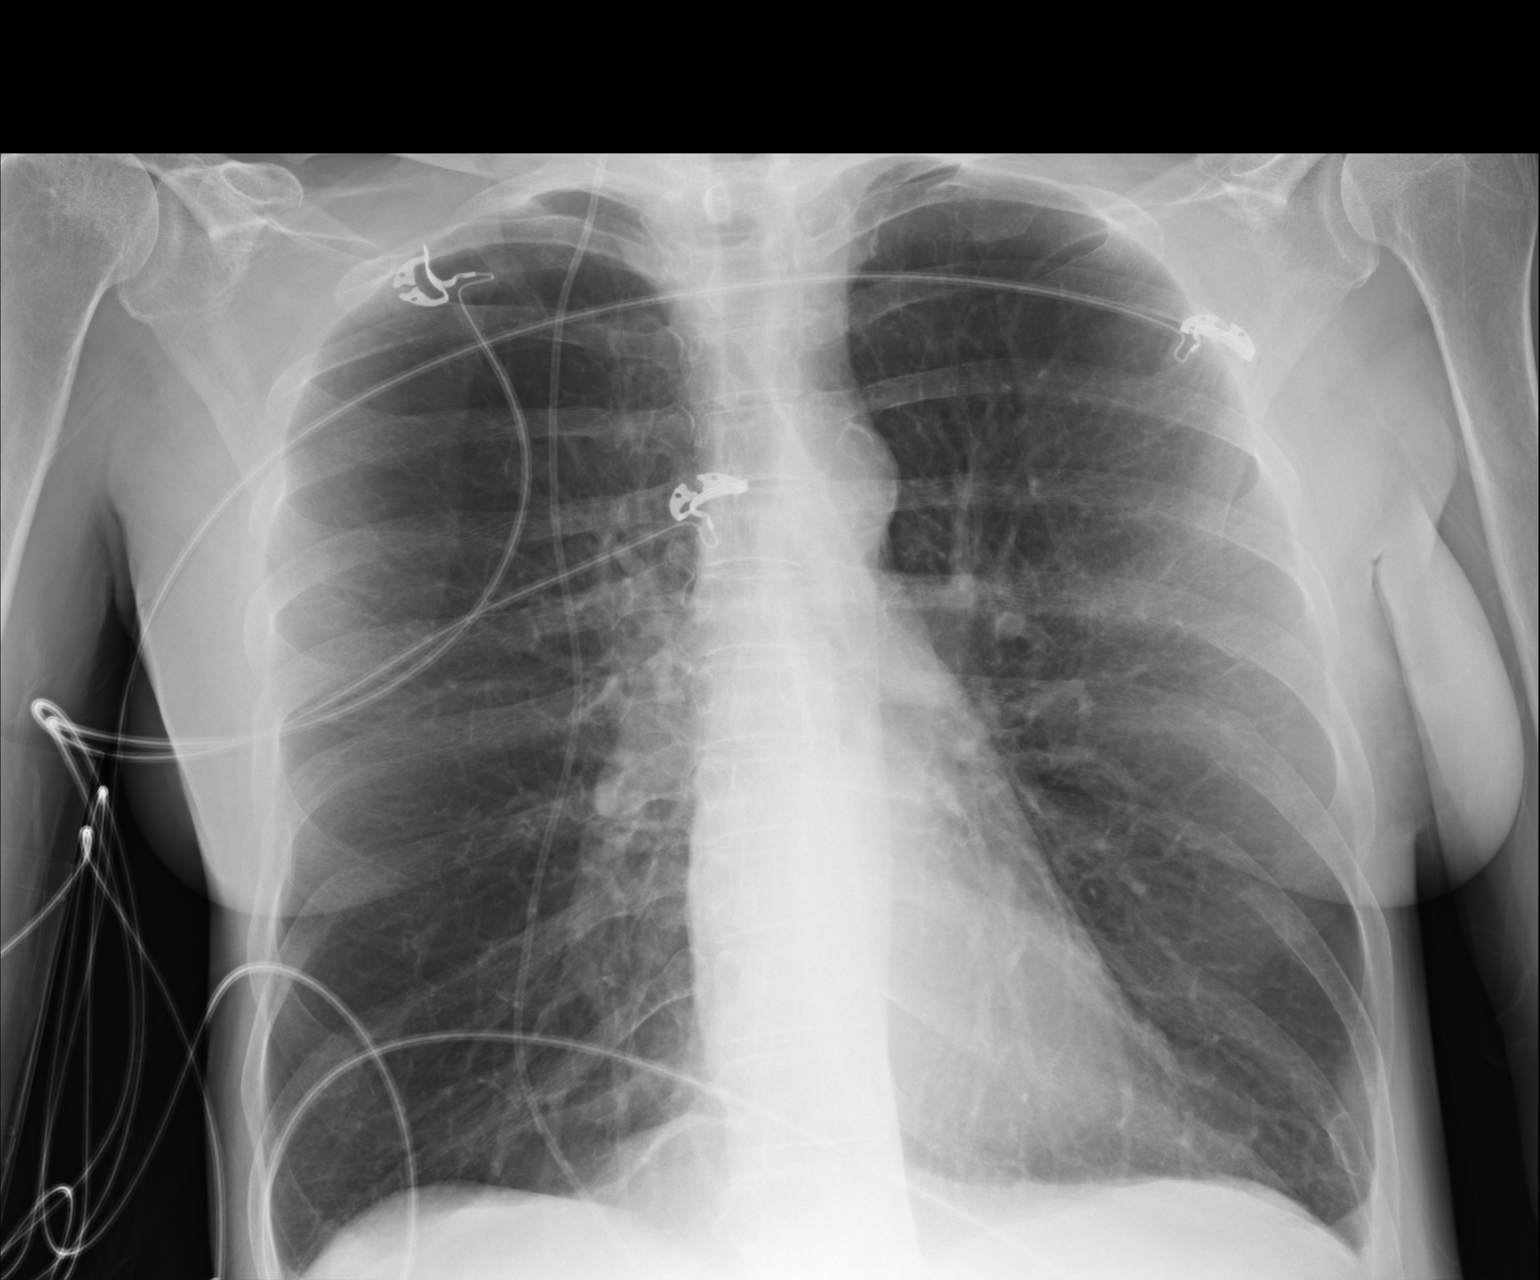

[1 of 1 positions shown; findings below may reference images not displayed]

FINDINGS: Cardiomediastinal silhouette is stable.  Right VP shunt
catheter is noted.  No acute infiltrate or pleural effusion.  No
pulmonary edema.
IMPRESSION: No active disease.  No significant change.

## 2013-09-10 IMAGING — US US ABDOMEN COMPLETE
1 series · 13 of 25 positions shown · non-contrast
Comparison: Ultrasound of the abdomen of 03/23/2012

CLINICAL DATA: Hepatitis C, cirrhosis

COMPLETE ABDOMINAL ULTRASOUND

[Series 1: us abdomen complete · 0.20mm/px · 13 of 97 slices shown]
[im 1/97]
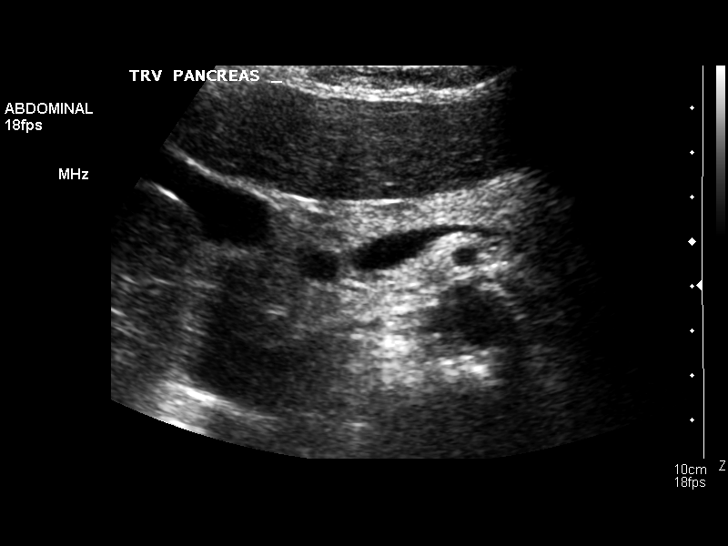
[im 9/97]
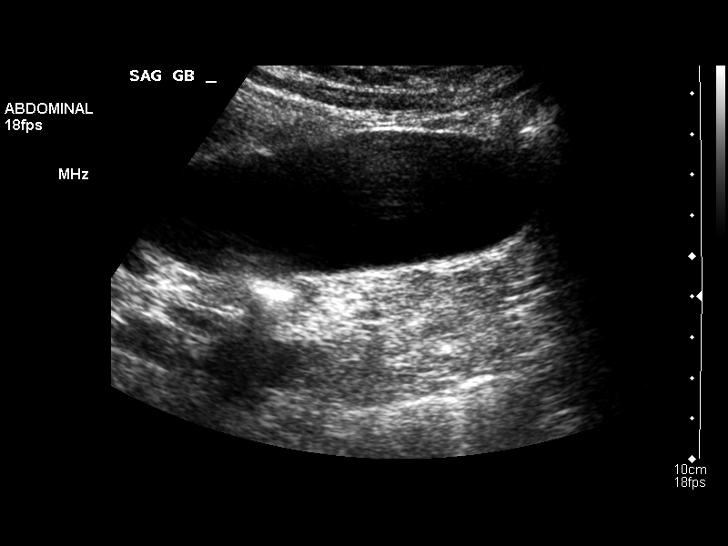
[im 17/97]
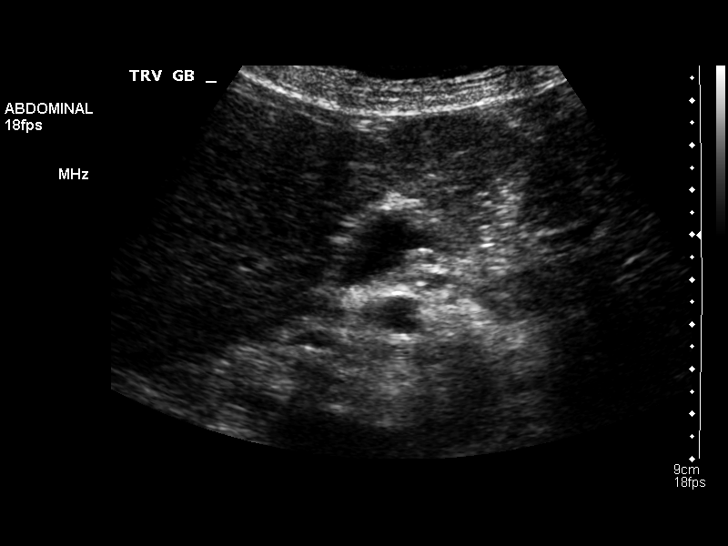
[im 25/97]
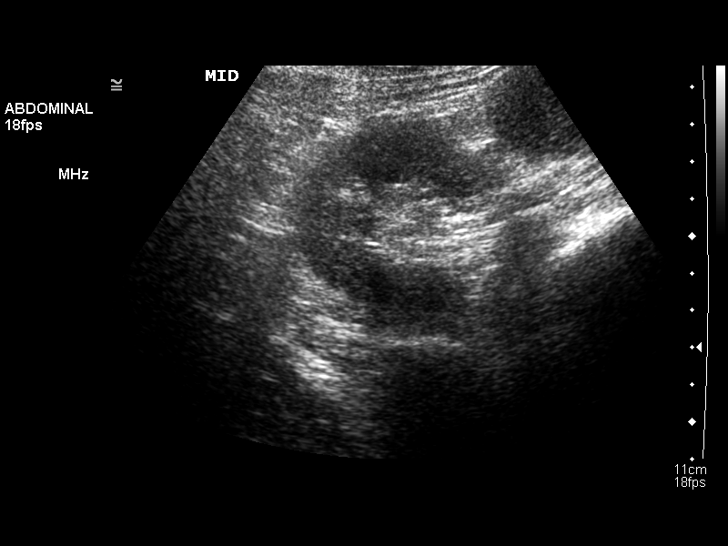
[im 33/97]
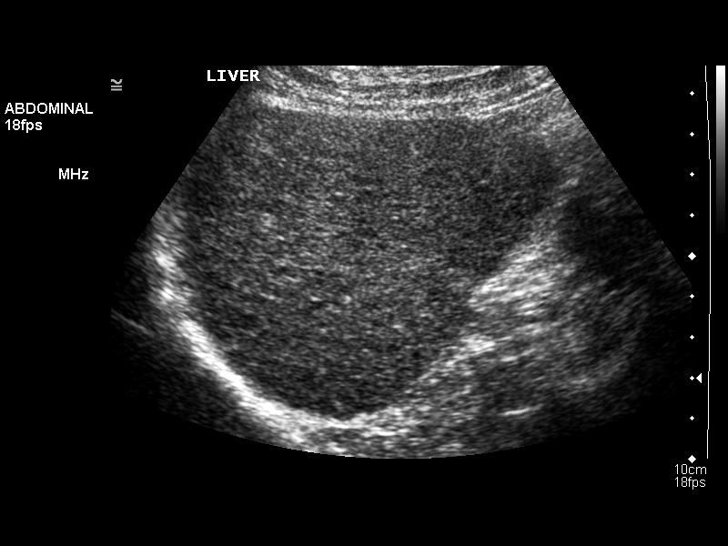
[im 41/97]
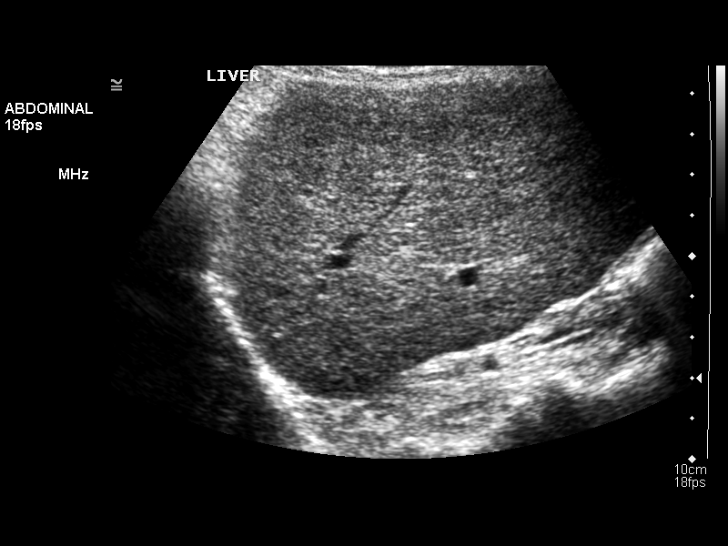
[im 49/97]
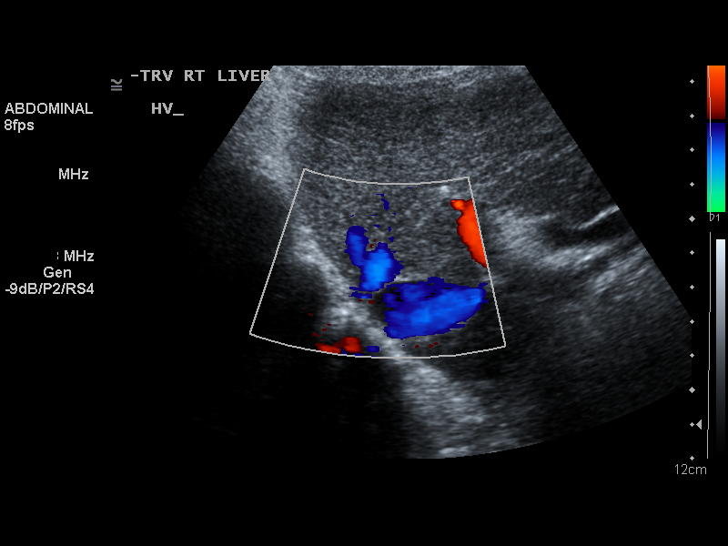
[im 57/97]
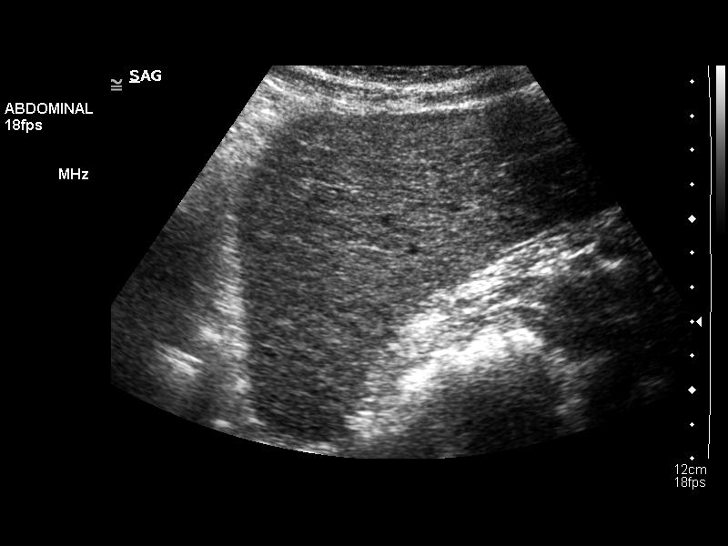
[im 65/97]
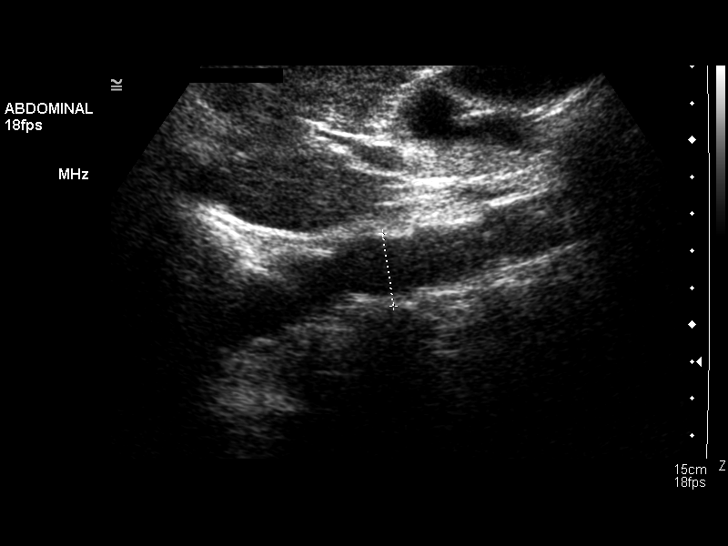
[im 73/97]
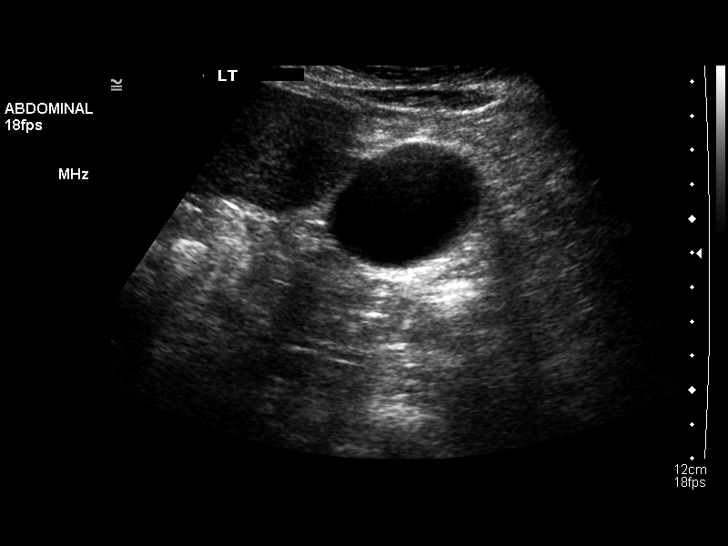
[im 81/97]
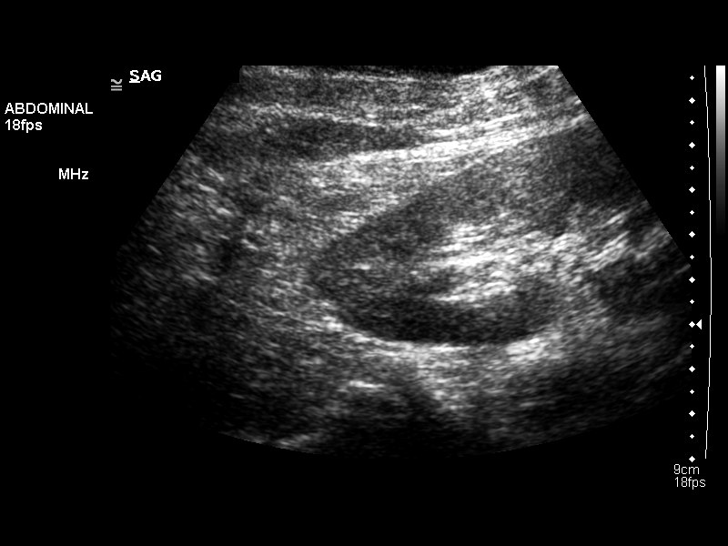
[im 89/97]
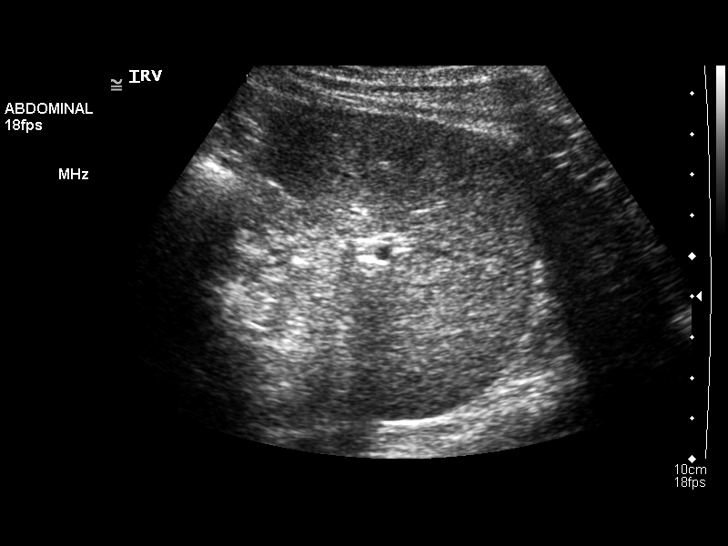
[im 97/97]
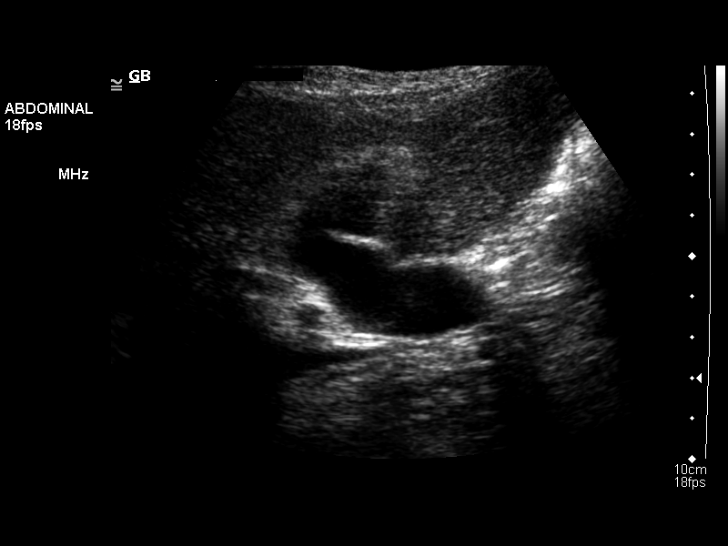

[13 of 25 positions shown; findings below may reference images not displayed]

FINDINGS: Gallbladder:  The gallbladder is again noted to be somewhat
elongated but no gallstones are seen, and there is no pain over the
gallbladder with compression.

Common bile duct:  The common bile duct is within upper limits of
normal measuring 6.9 mm in diameter.

Liver:  The liver is slightly inhomogeneous and echogenic
suggesting fatty infiltration.  No definite nodularity to the
margin of the liver is seen to indicate cirrhosis.  A small
echogenic focus in the right lobe is noted of 6 mm in diameter most
consistent with a small hemangioma, not definitely seen previously.

IVC:  Appears normal.

Pancreas:  The tail of the pancreas is obscured by bowel gas.

Spleen:  The spleen is normal measuring 7.1 cm sagittally.

Right Kidney:  No hydronephrosis is seen.  The right kidney
measures 11.0 cm sagittally.

Left Kidney:  No hydronephrosis is noted.  The left kidney measures
10.7 cm.

Abdominal aorta:  The abdominal aorta is normal in caliber.
IMPRESSION: 1.  Somewhat inhomogeneous and echogenic liver may indicate fatty
infiltration.  No nodularity of the periphery of the liver is seen
to indicate cirrhosis.
2.  No gallstones.
3.  Common bile duct within upper limits of normal. Correlate with
liver function tests.
4.  The tail of the pancreas is obscured by bowel gas.

## 2013-09-30 ENCOUNTER — Other Ambulatory Visit: Payer: Self-pay | Admitting: Family Medicine

## 2013-09-30 DIAGNOSIS — Z1231 Encounter for screening mammogram for malignant neoplasm of breast: Secondary | ICD-10-CM

## 2013-10-20 ENCOUNTER — Ambulatory Visit: Payer: Medicaid Other

## 2013-12-28 HISTORY — PX: TRANSTHORACIC ECHOCARDIOGRAM: SHX275

## 2014-01-06 ENCOUNTER — Ambulatory Visit
Admission: RE | Admit: 2014-01-06 | Discharge: 2014-01-06 | Disposition: A | Discharge status: Home / Self Care | Payer: Medicaid Other | Source: Ambulatory Visit | Attending: Family Medicine | Admitting: Family Medicine

## 2014-01-06 DIAGNOSIS — Z1231 Encounter for screening mammogram for malignant neoplasm of breast: Secondary | ICD-10-CM

## 2014-01-10 ENCOUNTER — Inpatient Hospital Stay (HOSPITAL_COMMUNITY)
Admission: EM | Admit: 2014-01-10 | Discharge: 2014-01-16 | DRG: 189 | Disposition: A | Discharge status: Home / Self Care | Payer: Medicaid Other | Attending: Internal Medicine | Admitting: Internal Medicine

## 2014-01-10 ENCOUNTER — Encounter (HOSPITAL_COMMUNITY): Payer: Self-pay | Admitting: Emergency Medicine

## 2014-01-10 ENCOUNTER — Emergency Department (HOSPITAL_COMMUNITY): Payer: Medicaid Other

## 2014-01-10 DIAGNOSIS — J9602 Acute respiratory failure with hypercapnia: Secondary | ICD-10-CM

## 2014-01-10 DIAGNOSIS — R0609 Other forms of dyspnea: Secondary | ICD-10-CM | POA: Diagnosis not present

## 2014-01-10 DIAGNOSIS — F10231 Alcohol dependence with withdrawal delirium: Secondary | ICD-10-CM | POA: Diagnosis not present

## 2014-01-10 DIAGNOSIS — E872 Acidosis, unspecified: Secondary | ICD-10-CM | POA: Diagnosis present

## 2014-01-10 DIAGNOSIS — F1010 Alcohol abuse, uncomplicated: Secondary | ICD-10-CM

## 2014-01-10 DIAGNOSIS — Z8249 Family history of ischemic heart disease and other diseases of the circulatory system: Secondary | ICD-10-CM

## 2014-01-10 DIAGNOSIS — F17200 Nicotine dependence, unspecified, uncomplicated: Secondary | ICD-10-CM | POA: Diagnosis present

## 2014-01-10 DIAGNOSIS — F329 Major depressive disorder, single episode, unspecified: Secondary | ICD-10-CM | POA: Diagnosis present

## 2014-01-10 DIAGNOSIS — IMO0002 Reserved for concepts with insufficient information to code with codable children: Secondary | ICD-10-CM | POA: Diagnosis not present

## 2014-01-10 DIAGNOSIS — I2510 Atherosclerotic heart disease of native coronary artery without angina pectoris: Secondary | ICD-10-CM

## 2014-01-10 DIAGNOSIS — D696 Thrombocytopenia, unspecified: Secondary | ICD-10-CM | POA: Diagnosis present

## 2014-01-10 DIAGNOSIS — I629 Nontraumatic intracranial hemorrhage, unspecified: Secondary | ICD-10-CM

## 2014-01-10 DIAGNOSIS — R0989 Other specified symptoms and signs involving the circulatory and respiratory systems: Secondary | ICD-10-CM

## 2014-01-10 DIAGNOSIS — I10 Essential (primary) hypertension: Secondary | ICD-10-CM

## 2014-01-10 DIAGNOSIS — I5042 Chronic combined systolic (congestive) and diastolic (congestive) heart failure: Secondary | ICD-10-CM

## 2014-01-10 DIAGNOSIS — Z915 Personal history of self-harm: Secondary | ICD-10-CM

## 2014-01-10 DIAGNOSIS — Z87891 Personal history of nicotine dependence: Secondary | ICD-10-CM | POA: Diagnosis present

## 2014-01-10 DIAGNOSIS — R569 Unspecified convulsions: Secondary | ICD-10-CM | POA: Diagnosis present

## 2014-01-10 DIAGNOSIS — G40909 Epilepsy, unspecified, not intractable, without status epilepticus: Secondary | ICD-10-CM | POA: Diagnosis present

## 2014-01-10 DIAGNOSIS — Z7982 Long term (current) use of aspirin: Secondary | ICD-10-CM | POA: Diagnosis not present

## 2014-01-10 DIAGNOSIS — R0603 Acute respiratory distress: Secondary | ICD-10-CM

## 2014-01-10 DIAGNOSIS — I428 Other cardiomyopathies: Secondary | ICD-10-CM

## 2014-01-10 DIAGNOSIS — Z930 Tracheostomy status: Secondary | ICD-10-CM | POA: Diagnosis not present

## 2014-01-10 DIAGNOSIS — Z9151 Personal history of suicidal behavior: Secondary | ICD-10-CM

## 2014-01-10 DIAGNOSIS — J9600 Acute respiratory failure, unspecified whether with hypoxia or hypercapnia: Secondary | ICD-10-CM | POA: Diagnosis present

## 2014-01-10 DIAGNOSIS — J9601 Acute respiratory failure with hypoxia: Secondary | ICD-10-CM

## 2014-01-10 DIAGNOSIS — J9620 Acute and chronic respiratory failure, unspecified whether with hypoxia or hypercapnia: Secondary | ICD-10-CM | POA: Diagnosis present

## 2014-01-10 DIAGNOSIS — I509 Heart failure, unspecified: Secondary | ICD-10-CM | POA: Diagnosis present

## 2014-01-10 DIAGNOSIS — K5900 Constipation, unspecified: Secondary | ICD-10-CM | POA: Diagnosis present

## 2014-01-10 DIAGNOSIS — J9622 Acute and chronic respiratory failure with hypercapnia: Secondary | ICD-10-CM

## 2014-01-10 DIAGNOSIS — J441 Chronic obstructive pulmonary disease with (acute) exacerbation: Secondary | ICD-10-CM

## 2014-01-10 DIAGNOSIS — Z720 Tobacco use: Secondary | ICD-10-CM

## 2014-01-10 DIAGNOSIS — F10931 Alcohol use, unspecified with withdrawal delirium: Secondary | ICD-10-CM | POA: Diagnosis not present

## 2014-01-10 DIAGNOSIS — F17210 Nicotine dependence, cigarettes, uncomplicated: Secondary | ICD-10-CM | POA: Diagnosis present

## 2014-01-10 DIAGNOSIS — G060 Intracranial abscess and granuloma: Secondary | ICD-10-CM

## 2014-01-10 DIAGNOSIS — I214 Non-ST elevation (NSTEMI) myocardial infarction: Secondary | ICD-10-CM

## 2014-01-10 DIAGNOSIS — I5032 Chronic diastolic (congestive) heart failure: Secondary | ICD-10-CM | POA: Diagnosis present

## 2014-01-10 LAB — BASIC METABOLIC PANEL WITH GFR
Anion gap: 14 (ref 5–15)
BUN: 7 mg/dL (ref 6–23)
CO2: 26 meq/L (ref 19–32)
Calcium: 9.2 mg/dL (ref 8.4–10.5)
Chloride: 100 meq/L (ref 96–112)
Creatinine, Ser: 0.48 mg/dL — ABNORMAL LOW (ref 0.50–1.10)
GFR calc Af Amer: >90 mL/min
GFR calc non Af Amer: >90 mL/min
Glucose, Bld: 121 mg/dL — ABNORMAL HIGH (ref 70–99)
Potassium: 4.5 meq/L (ref 3.7–5.3)
Sodium: 140 meq/L (ref 137–147)

## 2014-01-10 LAB — MRSA PCR SCREENING: MRSA by PCR: NEGATIVE

## 2014-01-10 LAB — I-STAT ARTERIAL BLOOD GAS, ED
Acid-Base Excess: 2 mmol/L (ref 0.0–2.0)
Acid-base deficit: 1 mmol/L (ref 0.0–2.0)
Bicarbonate: 30.4 mEq/L — ABNORMAL HIGH (ref 20.0–24.0)
Bicarbonate: 26.7 mEq/L — ABNORMAL HIGH (ref 20.0–24.0)
O2 Saturation: 99 %
O2 Saturation: 100 %
Patient temperature: 98.6
Patient temperature: 98.6
TCO2: 32 mmol/L (ref 0–100)
TCO2: 28 mmol/L (ref 0–100)
pCO2 arterial: 62.4 mmHg (ref 35.0–45.0)
pCO2 arterial: 52.9 mmHg — ABNORMAL HIGH (ref 35.0–45.0)
pH, Arterial: 7.296 — ABNORMAL LOW (ref 7.350–7.450)
pH, Arterial: 7.311 — ABNORMAL LOW (ref 7.350–7.450)
pO2, Arterial: 180 mmHg — ABNORMAL HIGH (ref 80.0–100.0)
pO2, Arterial: 187 mmHg — ABNORMAL HIGH (ref 80.0–100.0)

## 2014-01-10 LAB — TSH: TSH: 0.343 u[IU]/mL — ABNORMAL LOW (ref 0.350–4.500)

## 2014-01-10 LAB — RAPID URINE DRUG SCREEN, HOSP PERFORMED
Amphetamines: NOT DETECTED
Barbiturates: NOT DETECTED
Benzodiazepines: NOT DETECTED
Cocaine: NOT DETECTED
Opiates: NOT DETECTED
Tetrahydrocannabinol: NOT DETECTED

## 2014-01-10 LAB — CBC WITH DIFFERENTIAL/PLATELET
Basophils Absolute: 0 10*3/uL (ref 0.0–0.1)
Basophils Absolute: 0 10*3/uL (ref 0.0–0.1)
Basophils Relative: 0 % (ref 0–1)
Basophils Relative: 0 % (ref 0–1)
Eosinophils Absolute: 0.1 10*3/uL (ref 0.0–0.7)
Eosinophils Absolute: 0 10*3/uL (ref 0.0–0.7)
Eosinophils Relative: 1 % (ref 0–5)
Eosinophils Relative: 0 % (ref 0–5)
HCT: 46.2 % — ABNORMAL HIGH (ref 36.0–46.0)
HCT: 40.7 % (ref 36.0–46.0)
Hemoglobin: 16.2 g/dL — ABNORMAL HIGH (ref 12.0–15.0)
Hemoglobin: 13.5 g/dL (ref 12.0–15.0)
Lymphocytes Relative: 13 % (ref 12–46)
Lymphocytes Relative: 6 % — ABNORMAL LOW (ref 12–46)
Lymphs Abs: 1.7 10*3/uL (ref 0.7–4.0)
Lymphs Abs: 0.8 10*3/uL (ref 0.7–4.0)
MCH: 34.5 pg — ABNORMAL HIGH (ref 26.0–34.0)
MCH: 33.8 pg (ref 26.0–34.0)
MCHC: 35.1 g/dL (ref 30.0–36.0)
MCHC: 33.2 g/dL (ref 30.0–36.0)
MCV: 98.5 fL (ref 78.0–100.0)
MCV: 101.8 fL — ABNORMAL HIGH (ref 78.0–100.0)
Monocytes Absolute: 1 10*3/uL (ref 0.1–1.0)
Monocytes Absolute: 0.6 10*3/uL (ref 0.1–1.0)
Monocytes Relative: 8 % (ref 3–12)
Monocytes Relative: 4 % (ref 3–12)
Neutro Abs: 10.4 10*3/uL — ABNORMAL HIGH (ref 1.7–7.7)
Neutro Abs: 12.5 10*3/uL — ABNORMAL HIGH (ref 1.7–7.7)
Neutrophils Relative %: 79 % — ABNORMAL HIGH (ref 43–77)
Neutrophils Relative %: 90 % — ABNORMAL HIGH (ref 43–77)
Platelets: 113 10*3/uL — ABNORMAL LOW (ref 150–400)
Platelets: 115 10*3/uL — ABNORMAL LOW (ref 150–400)
RBC: 4.69 MIL/uL (ref 3.87–5.11)
RBC: 4 MIL/uL (ref 3.87–5.11)
RDW: 12.9 % (ref 11.5–15.5)
RDW: 13 % (ref 11.5–15.5)
WBC: 13.3 10*3/uL — ABNORMAL HIGH (ref 4.0–10.5)
WBC: 13.9 10*3/uL — ABNORMAL HIGH (ref 4.0–10.5)

## 2014-01-10 LAB — COMPREHENSIVE METABOLIC PANEL
ALT: 77 U/L — ABNORMAL HIGH (ref 0–35)
AST: 68 U/L — ABNORMAL HIGH (ref 0–37)
Albumin: 3 g/dL — ABNORMAL LOW (ref 3.5–5.2)
Alkaline Phosphatase: 185 U/L — ABNORMAL HIGH (ref 39–117)
Anion gap: 13 (ref 5–15)
BUN: 8 mg/dL (ref 6–23)
CO2: 26 mEq/L (ref 19–32)
Calcium: 9 mg/dL (ref 8.4–10.5)
Chloride: 100 mEq/L (ref 96–112)
Creatinine, Ser: 0.49 mg/dL — ABNORMAL LOW (ref 0.50–1.10)
GFR calc Af Amer: >90 mL/min (ref >90)
GFR calc non Af Amer: >90 mL/min (ref >90)
Glucose, Bld: 134 mg/dL — ABNORMAL HIGH (ref 70–99)
Potassium: 4.1 mEq/L (ref 3.7–5.3)
Sodium: 139 mEq/L (ref 137–147)
Total Bilirubin: 0.4 mg/dL (ref 0.3–1.2)
Total Protein: 7 g/dL (ref 6.0–8.3)

## 2014-01-10 LAB — I-STAT CG4 LACTIC ACID, ED: Lactic Acid, Venous: 1 mmol/L (ref 0.5–2.2)

## 2014-01-10 LAB — PHENYTOIN LEVEL, TOTAL: Phenytoin Lvl: 15.6 ug/mL (ref 10.0–20.0)

## 2014-01-10 LAB — PRO B NATRIURETIC PEPTIDE: Pro B Natriuretic peptide (BNP): 164.6 pg/mL — ABNORMAL HIGH (ref 0–125)

## 2014-01-10 LAB — TROPONIN I: Troponin I: <0.3 ng/mL (ref <0.30)

## 2014-01-10 MED ORDER — MAGNESIUM SULFATE 40 MG/ML IJ SOLN
2.0000 g | Freq: Once | INTRAMUSCULAR | Status: AC
  Administered 2014-01-10: 2 g via INTRAVENOUS
  Filled 2014-01-10: qty 50

## 2014-01-10 MED ORDER — FOLIC ACID 1 MG PO TABS
1.0000 mg | ORAL_TABLET | Freq: Every day | ORAL | Status: DC
  Filled 2014-01-10: qty 1

## 2014-01-10 MED ORDER — METHYLPREDNISOLONE SODIUM SUCC 125 MG IJ SOLR
60.0000 mg | Freq: Four times a day (QID) | INTRAMUSCULAR | Status: DC
  Administered 2014-01-10 – 2014-01-11 (×4): 60 mg via INTRAVENOUS
  Filled 2014-01-10: qty 0.96
  Filled 2014-01-10: qty 2
  Filled 2014-01-10 (×7): qty 0.96
  Filled 2014-01-10: qty 2

## 2014-01-10 MED ORDER — IPRATROPIUM BROMIDE 0.02 % IN SOLN
0.5000 mg | Freq: Once | RESPIRATORY_TRACT | Status: AC
  Administered 2014-01-10: 0.5 mg via RESPIRATORY_TRACT
  Filled 2014-01-10: qty 2.5

## 2014-01-10 MED ORDER — ALBUTEROL (5 MG/ML) CONTINUOUS INHALATION SOLN
15.0000 mg/h | INHALATION_SOLUTION | Freq: Once | RESPIRATORY_TRACT | Status: AC
  Administered 2014-01-10: 15 mg/h via RESPIRATORY_TRACT
  Filled 2014-01-10: qty 20

## 2014-01-10 MED ORDER — METHYLPREDNISOLONE SODIUM SUCC 125 MG IJ SOLR
125.0000 mg | Freq: Once | INTRAMUSCULAR | Status: AC
  Administered 2014-01-10: 125 mg via INTRAVENOUS
  Filled 2014-01-10: qty 2

## 2014-01-10 MED ORDER — PNEUMOCOCCAL VAC POLYVALENT 25 MCG/0.5ML IJ INJ
0.5000 mL | INJECTION | INTRAMUSCULAR | Status: AC
  Administered 2014-01-11: 0.5 mL via INTRAMUSCULAR
  Filled 2014-01-10: qty 0.5

## 2014-01-10 MED ORDER — ASPIRIN 81 MG PO CHEW
81.0000 mg | CHEWABLE_TABLET | Freq: Every day | ORAL | Status: DC
  Administered 2014-01-10 – 2014-01-16 (×7): 81 mg via ORAL
  Filled 2014-01-10 (×7): qty 1

## 2014-01-10 MED ORDER — VITAMIN B-1 100 MG PO TABS
100.0000 mg | ORAL_TABLET | Freq: Every day | ORAL | Status: DC
  Filled 2014-01-10: qty 1

## 2014-01-10 MED ORDER — METOPROLOL SUCCINATE ER 50 MG PO TB24
50.0000 mg | ORAL_TABLET | Freq: Two times a day (BID) | ORAL | Status: DC
  Administered 2014-01-10 – 2014-01-16 (×12): 50 mg via ORAL
  Filled 2014-01-10 (×14): qty 1

## 2014-01-10 MED ORDER — IPRATROPIUM BROMIDE 0.02 % IN SOLN
0.5000 mg | RESPIRATORY_TRACT | Status: DC
Start: 2014-01-10 — End: 2014-01-10
  Administered 2014-01-10 (×3): 0.5 mg via RESPIRATORY_TRACT
  Filled 2014-01-10 (×3): qty 2.5

## 2014-01-10 MED ORDER — INFLUENZA VAC SPLIT QUAD 0.5 ML IM SUSY
0.5000 mL | PREFILLED_SYRINGE | INTRAMUSCULAR | Status: AC
  Administered 2014-01-11: 0.5 mL via INTRAMUSCULAR
  Filled 2014-01-10: qty 0.5

## 2014-01-10 MED ORDER — PHENYTOIN SODIUM EXTENDED 100 MG PO CAPS
100.0000 mg | ORAL_CAPSULE | Freq: Two times a day (BID) | ORAL | Status: DC
  Administered 2014-01-10 – 2014-01-13 (×7): 100 mg via ORAL
  Filled 2014-01-10 (×8): qty 1

## 2014-01-10 MED ORDER — ACETAMINOPHEN 650 MG RE SUPP: 650.0000 mg | Freq: Four times a day (QID) | RECTAL | Status: DC | PRN

## 2014-01-10 MED ORDER — FOLIC ACID 1 MG PO TABS
1.0000 mg | ORAL_TABLET | Freq: Every day | ORAL | Status: DC
  Administered 2014-01-10 – 2014-01-16 (×7): 1 mg via ORAL
  Filled 2014-01-10 (×7): qty 1

## 2014-01-10 MED ORDER — ALBUTEROL SULFATE (2.5 MG/3ML) 0.083% IN NEBU
2.5000 mg | INHALATION_SOLUTION | Freq: Four times a day (QID) | RESPIRATORY_TRACT | Status: DC
  Administered 2014-01-10 – 2014-01-11 (×2): 2.5 mg via RESPIRATORY_TRACT
  Filled 2014-01-10 (×2): qty 3

## 2014-01-10 MED ORDER — BUDESONIDE 0.25 MG/2ML IN SUSP
0.2500 mg | Freq: Two times a day (BID) | RESPIRATORY_TRACT | Status: DC
  Administered 2014-01-10 – 2014-01-16 (×13): 0.25 mg via RESPIRATORY_TRACT
  Filled 2014-01-10 (×15): qty 2

## 2014-01-10 MED ORDER — LISINOPRIL 10 MG PO TABS
10.0000 mg | ORAL_TABLET | Freq: Every day | ORAL | Status: DC
  Administered 2014-01-10 – 2014-01-16 (×7): 10 mg via ORAL
  Filled 2014-01-10 (×7): qty 1

## 2014-01-10 MED ORDER — ALBUTEROL SULFATE (2.5 MG/3ML) 0.083% IN NEBU
2.5000 mg | INHALATION_SOLUTION | RESPIRATORY_TRACT | Status: DC
  Administered 2014-01-10 (×3): 2.5 mg via RESPIRATORY_TRACT
  Filled 2014-01-10 (×3): qty 3

## 2014-01-10 MED ORDER — ADULT MULTIVITAMIN W/MINERALS CH
1.0000 | ORAL_TABLET | Freq: Every day | ORAL | Status: DC
  Administered 2014-01-10 – 2014-01-16 (×7): 1 via ORAL
  Filled 2014-01-10 (×7): qty 1

## 2014-01-10 MED ORDER — LORAZEPAM 0.5 MG PO TABS
1.0000 mg | ORAL_TABLET | Freq: Four times a day (QID) | ORAL | Status: DC | PRN
  Administered 2014-01-10 – 2014-01-11 (×3): 1 mg via ORAL
  Filled 2014-01-10 (×3): qty 2

## 2014-01-10 MED ORDER — LEVOFLOXACIN IN D5W 500 MG/100ML IV SOLN
500.0000 mg | Freq: Once | INTRAVENOUS | Status: DC
  Filled 2014-01-10: qty 100

## 2014-01-10 MED ORDER — LEVOFLOXACIN IN D5W 500 MG/100ML IV SOLN
500.0000 mg | Freq: Once | INTRAVENOUS | Status: AC
  Administered 2014-01-10: 500 mg via INTRAVENOUS
  Filled 2014-01-10: qty 100

## 2014-01-10 MED ORDER — ONDANSETRON HCL 4 MG/2ML IJ SOLN: 4.0000 mg | Freq: Four times a day (QID) | INTRAMUSCULAR | Status: DC | PRN

## 2014-01-10 MED ORDER — ACETAMINOPHEN 325 MG PO TABS
650.0000 mg | ORAL_TABLET | Freq: Four times a day (QID) | ORAL | Status: DC | PRN
  Administered 2014-01-10: 650 mg via ORAL
  Filled 2014-01-10: qty 2

## 2014-01-10 MED ORDER — GABAPENTIN 300 MG PO CAPS
300.0000 mg | ORAL_CAPSULE | Freq: Every day | ORAL | Status: DC
  Administered 2014-01-10 – 2014-01-16 (×7): 300 mg via ORAL
  Filled 2014-01-10 (×7): qty 1

## 2014-01-10 MED ORDER — SODIUM CHLORIDE 0.9 % IJ SOLN
3.0000 mL | Freq: Two times a day (BID) | INTRAMUSCULAR | Status: DC
  Administered 2014-01-10 – 2014-01-16 (×11): 3 mL via INTRAVENOUS

## 2014-01-10 MED ORDER — LEVETIRACETAM 500 MG PO TABS
500.0000 mg | ORAL_TABLET | Freq: Two times a day (BID) | ORAL | Status: DC
  Administered 2014-01-10 – 2014-01-16 (×13): 500 mg via ORAL
  Filled 2014-01-10 (×14): qty 1

## 2014-01-10 MED ORDER — THIAMINE HCL 100 MG/ML IJ SOLN
100.0000 mg | Freq: Every day | INTRAMUSCULAR | Status: DC
  Filled 2014-01-10: qty 1

## 2014-01-10 MED ORDER — LORAZEPAM 2 MG/ML IJ SOLN
1.0000 mg | Freq: Four times a day (QID) | INTRAMUSCULAR | Status: DC | PRN
  Filled 2014-01-10: qty 1

## 2014-01-10 MED ORDER — B COMPLEX PO TABS: 1.0000 | ORAL_TABLET | Freq: Two times a day (BID) | ORAL | Status: DC

## 2014-01-10 MED ORDER — DICLOFENAC SODIUM 1 % TD GEL
4.0000 g | Freq: Four times a day (QID) | TRANSDERMAL | Status: DC | PRN
  Filled 2014-01-10: qty 100

## 2014-01-10 MED ORDER — VITAMIN D (ERGOCALCIFEROL) 1.25 MG (50000 UNIT) PO CAPS
50000.0000 [IU] | ORAL_CAPSULE | ORAL | Status: DC
  Administered 2014-01-10: 50000 [IU] via ORAL
  Filled 2014-01-10: qty 1

## 2014-01-10 MED ORDER — IPRATROPIUM BROMIDE 0.02 % IN SOLN
0.5000 mg | Freq: Four times a day (QID) | RESPIRATORY_TRACT | Status: DC
  Administered 2014-01-10 – 2014-01-11 (×2): 0.5 mg via RESPIRATORY_TRACT
  Filled 2014-01-10 (×2): qty 2.5

## 2014-01-10 MED ORDER — ALBUTEROL SULFATE (2.5 MG/3ML) 0.083% IN NEBU
2.5000 mg | INHALATION_SOLUTION | RESPIRATORY_TRACT | Status: DC | PRN
  Administered 2014-01-13 – 2014-01-15 (×5): 2.5 mg via RESPIRATORY_TRACT
  Filled 2014-01-10 (×4): qty 3

## 2014-01-10 MED ORDER — LEVOFLOXACIN IN D5W 500 MG/100ML IV SOLN
500.0000 mg | INTRAVENOUS | Status: DC
  Administered 2014-01-11 – 2014-01-13 (×3): 500 mg via INTRAVENOUS
  Filled 2014-01-10 (×4): qty 100

## 2014-01-10 MED ORDER — B COMPLEX-C PO TABS
1.0000 | ORAL_TABLET | Freq: Every day | ORAL | Status: DC
  Administered 2014-01-10 – 2014-01-16 (×7): 1 via ORAL
  Filled 2014-01-10 (×7): qty 1

## 2014-01-10 MED ORDER — NICOTINE 21 MG/24HR TD PT24
21.0000 mg | MEDICATED_PATCH | Freq: Every day | TRANSDERMAL | Status: DC
  Administered 2014-01-10 – 2014-01-16 (×7): 21 mg via TRANSDERMAL
  Filled 2014-01-10 (×7): qty 1

## 2014-01-10 MED ORDER — VITAMIN B-1 100 MG PO TABS
100.0000 mg | ORAL_TABLET | Freq: Every day | ORAL | Status: DC
  Administered 2014-01-10 – 2014-01-16 (×7): 100 mg via ORAL
  Filled 2014-01-10 (×7): qty 1

## 2014-01-10 MED ORDER — ONDANSETRON HCL 4 MG PO TABS: 4.0000 mg | ORAL_TABLET | Freq: Four times a day (QID) | ORAL | Status: DC | PRN

## 2014-01-10 MED ORDER — ENOXAPARIN SODIUM 40 MG/0.4ML ~~LOC~~ SOLN
40.0000 mg | SUBCUTANEOUS | Status: DC
  Administered 2014-01-10 – 2014-01-16 (×7): 40 mg via SUBCUTANEOUS
  Filled 2014-01-10 (×7): qty 0.4

## 2014-01-10 NOTE — ED Notes (Signed)
Pt began having sob all day, pt has used MDI several times and home neb with no relief. Pt states breathing got worse so she called EMS. Per sister pt was out of breath all day. Pt received 3 neb tx. Pt having thick green discharge from trach site, this is new for pt.

## 2014-01-10 NOTE — Progress Notes (Signed)
58yo female c/o SOB for a few days then acutely came worse, admitted w/ COPD exacerbation, to begin IV ABX.  Will start Levaquin 500mg  IV Q24H for CrCl ~65 ml/min and monitor CBC and Cx.  Wynona Neat, PharmD, BCPS 01/10/2014 6:38 AM

## 2014-01-10 NOTE — Progress Notes (Signed)
[facility]  SENTORIA BRENT MVH:[id number] DOB: 04-08-1956 DOA: 01/10/2014 PCP: Saralyn Pilar  Brief narrative: 58 year old female patient with known COPD and ongoing tobacco abuse. 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 She presented to the emergency department with complaints of shortness of breath intermittently for several days.   After arrival to the emergency department the patient was acutely short of breath and was placed on BiPAP. ABG was consistent with respiratory acidosis with a pH of 7.29 PCO2 of 62. She denied chest pain, productive cough, fever, chills, nausea, vomiting or abdominal pain or diarrhea. She reports noticing increased bruising of extremities. Renal function was normal, she did have mild transaminitis with a normal total bilirubin, showed mild leukocytosis 13,300, she also had mild thrombocytopenia with a platelet count of 113,000 which is at her normal baseline. Her chest x-ray showed COPD without evidence of focal infiltrate consistent with pneumonia.  HPI/Subjective: Alert and endorsing improved symptoms as compared to admission but is not back at her baseline respiratory status. Denies a baseline dyspnea on exertion.  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 Appears compensated at this time-last inpatient echocardiogram was in 2013 with an EF of 30-35% -may benefit from repeat echo this admission as part of routine followup especially since presenting with respiratory symptoms      Coronary artery disease, non-occlusive Currently asymptomatic-see above regarding echocardiogram     Hypertension Continue Prinivil and Toprol     Tobacco use Counseled regarding cessation-nicotine patch    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 underlying cirrhosis Continue CIWA protocol-low magnesium and replaced-follow lab    Seizure disorder Continue Dilantin and Keppra-Dilantin level therapeutic-check Levetiracetam level  DVT prophylaxis: Lovenox Code Status: Full Family Communication: No family at bedside Disposition Plan/Expected LOS: Stepdown due to high-risk to require BiPAP  Consultants: None   Procedures: None  Cultures: 9/14 blood cultures x2 pending  Antibiotics: Levaquin 9/13 >>>  Objective: Blood pressure 107/79, pulse 92, temperature 97.7 F (36.5 C), temperature source Oral, resp. rate 17, height 5\' 8"  (1.727 m), weight 115 lb 15.4 oz (52.6 kg), SpO2 100.00%. No intake or output data in the 24 hours ending 01/10/14 1402  Exam: Followup exam completed-patient admitted at 5:13 AM today  Scheduled Meds:  Scheduled Meds: . albuterol  2.5 mg Nebulization Q4H  . aspirin  81 mg Oral Daily  . B-complex with vitamin C  1 tablet Oral Daily  . budesonide (PULMICORT) nebulizer solution  0.25 mg Nebulization BID  . enoxaparin (LOVENOX) injection  40 mg Subcutaneous Q24H  . folic acid  1 mg Oral Daily  . gabapentin  300 mg Oral Daily  . [START ON 01/11/2014] Influenza vac split quadrivalent PF  0.5 mL Intramuscular Tomorrow-1000  . ipratropium  0.5 mg Nebulization Q4H  . levETIRAcetam  500 mg Oral BID  . [START ON 01/11/2014] levofloxacin (LEVAQUIN) IV  500 mg Intravenous Q24H  . lisinopril  10 mg Oral Daily  . metoprolol succinate  50 mg Oral BID  . multivitamin with minerals  1 tablet Oral Daily  . nicotine  21 mg Transdermal Daily  . phenytoin  100 mg Oral BID  . [START ON 01/11/2014] pneumococcal 23  valent vaccine  0.5 mL Intramuscular Tomorrow-1000  . sodium chloride  3 mL Intravenous Q12H  . thiamine  100 mg Oral  Daily  . Vitamin D (Ergocalciferol)  50,000 Units Oral Q7 days   Data Reviewed: Basic Metabolic Panel:  Recent Labs Lab 01/10/14 0208 01/10/14 1048  NA 140 139  K 4.5 4.1  CL 100 100  CO2 26 26  GLUCOSE 121* 134*  BUN 7 8  CREATININE 0.48* 0.49*  CALCIUM 9.2 9.0   Liver Function Tests:  Recent Labs Lab 01/10/14 1048  AST 68*  ALT 77*  ALKPHOS 185*  BILITOT 0.4  PROT 7.0  ALBUMIN 3.0*   CBC:  Recent Labs Lab 01/10/14 0208 01/10/14 1048  WBC 13.3* 13.9*  NEUTROABS 10.4* 12.5*  HGB 16.2* 13.5  HCT 46.2* 40.7  MCV 98.5 101.8*  PLT 113* 115*   Cardiac Enzymes:  Recent Labs Lab 01/10/14 1048  TROPONINI <0.30   BNP (last 3 results)  Recent Labs  01/10/14 1048  PROBNP 164.6*    Recent Results (from the past 240 hour(s))  MRSA PCR SCREENING     Status: None   Collection Time    01/10/14  6:08 AM      Result Value Ref Range Status   MRSA by PCR NEGATIVE  NEGATIVE Final   Comment:            The GeneXpert MRSA Assay (FDA     approved for NASAL specimens     only), is one component of a     comprehensive MRSA colonization     surveillance program. It is not     intended to diagnose MRSA     infection nor to guide or     monitor treatment for     MRSA infections.     Studies:  Recent x-ray studies have been reviewed in detail by the Attending Physician  Time spent :  25+ mins      Erin Hearing, ANP [facility] Office  [phone] Pager [phone]   **If unable to reach the above provider after paging please contact the [facility] @ [phone]  On-Call/Text Page:      [email or web address]      password [email or web address]  If 7PM-7AM, please contact night-coverage [email or web address] Password [email or web address] 01/10/2014, 2:02 PM   LOS: 0 days   I have personally examined this patient and reviewed the entire database. I have reviewed the above note, made any necessary editorial changes, and agree with its content.  Cherene Altes, MD [facility]

## 2014-01-10 NOTE — ED Provider Notes (Signed)
CSN: [id number]     Arrival date & time 01/10/14  0146 History   First MD Initiated Contact with Patient 01/10/14 0155     Chief Complaint  Patient presents with  . Shortness of Breath     (Consider location/radiation/quality/duration/timing/severity/associated sxs/prior Treatment) HPI  This is a 58 year old female with history of seizure disorder, COPD who presents with shortness of breath. Patient reports worsening shortness of breath today. She has used her inhalers at home multiple times without relief. EMS was called. Upon their arrival, patient was noted to have increased work of breathing. She was given 3 treatments in route. They were unable to obtain IV access were obtained a good pulse ox reading. Patient has also noted thick green discharge from her old trach site. This is new for her. Upon my evaluation, patient is tachypneic with a respiratory rate in the 30s. She is in respiratory distress. She denies any fevers. She denies any recent hospitalizations.  Level 5 caveat for acuity of condition   Past Medical History  Diagnosis Date  . Meningitis 2009  . Hx of ventricular shunt 2009  . Seizure disorder   . 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 Suicide attempt     History of suicide attempts August 2006 and July 2006  . History of alcohol abuse   . History of cocaine abuse   . Major depressive disorder     History of  . History of iron deficiency   . COPD (chronic obstructive pulmonary disease)   . History of acute alcoholic hepatitis   . Psychiatric diagnosis     Multiple  . 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 Left breast mass    Past Surgical History  Procedure Laterality Date  . Craniotomy    . Breast mass removal     Family History  Problem Relation Age of Onset  . Coronary artery disease Father     History  Substance Use Topics  . Smoking status: Current Every Day Smoker -- 1.00 packs/day    Types: Cigarettes  . Smokeless tobacco: Not on file  . Alcohol Use: Yes   OB History   Grav Para Term Preterm Abortions TAB SAB Ect Mult Living                 Review of Systems  Constitutional: Negative for fever.  Respiratory: Positive for cough and shortness of breath. Negative for chest tightness.   Cardiovascular: Negative for chest pain and leg swelling.  Gastrointestinal: Negative for nausea, vomiting and abdominal pain.  Genitourinary: Negative for dysuria.  Musculoskeletal: Negative for back pain.  Skin: Negative for wound.  Neurological: Negative for headaches.  All other systems reviewed and are negative.     Allergies  Review of patient's allergies indicates no known allergies.  Home Medications   Prior to Admission medications   Medication Sig Start Date End Date Taking? Authorizing Provider  acetaminophen (TYLENOL) 500 MG tablet Take 1,000 mg by mouth every 6 (six) hours as needed for pain.   Yes Historical Provider, MD  albuterol (PROVENTIL HFA;VENTOLIN HFA) 108 (90 BASE) MCG/ACT inhaler Inhale 2 puffs into the lungs 2 (two) times daily as needed for wheezing or shortness of breath.   Yes Historical Provider, MD  albuterol (PROVENTIL) (2.5 MG/3ML) 0.083% nebulizer solution Take 2.5 mg by  nebulization every 6 (six) hours as needed for wheezing or shortness of breath.   Yes Historical Provider, MD  aspirin 81 MG chewable tablet Chew 81 mg by mouth daily.   Yes Historical Provider, MD  b complex vitamins tablet Take 1 tablet by mouth 2 (two) times daily. 01/28/12  Yes Eugenie Filler, MD  diclofenac sodium (VOLTAREN) 1 % GEL Apply 4 g topically 4 (four) times daily as needed (for muscle pain).   Yes Historical Provider, MD  Fluticasone-Salmeterol (ADVAIR) 250-50 MCG/DOSE AEPB Inhale 1 puff into the lungs every 12 (twelve) hours. 06/07/11  Yes Valaria Good, MD   folic acid (FOLVITE) 1 MG tablet Take 1 mg by mouth daily.   Yes Historical Provider, MD  gabapentin (NEURONTIN) 300 MG capsule Take 300 mg by mouth daily.   Yes Historical Provider, MD  ibuprofen (ADVIL,MOTRIN) 200 MG tablet Take 400 mg by mouth daily as needed for pain.   Yes Historical Provider, MD  metoprolol succinate (TOPROL-XL) 50 MG 24 hr tablet Take 50 mg by mouth 2 (two) times daily. Take with or immediately following a meal.   Yes Historical Provider, MD  Multiple Vitamin (MULITIVITAMIN WITH MINERALS) TABS Take 1 tablet by mouth daily. 06/07/11  Yes Valaria Good, MD  phenytoin (DILANTIN) 100 MG ER capsule Take 100 mg by mouth 2 (two) times daily.    Yes Historical Provider, MD  PRESCRIPTION MEDICATION Take by mouth 2 (two) times daily. For ammonia in liver   Yes Historical Provider, MD  thiamine 100 MG tablet Take 1 tablet (100 mg total) by mouth daily. 06/07/11  Yes Valaria Good, MD  tiotropium (SPIRIVA) 18 MCG inhalation capsule Place 1 capsule (18 mcg total) into inhaler and inhale daily. 06/07/11  Yes Valaria Good, MD  Vitamin D, Ergocalciferol, (DRISDOL) 50000 UNITS CAPS Take 50,000 Units by mouth every 7 (seven) days. On Mondays   Yes Historical Provider, MD  levETIRAcetam (KEPPRA) 500 MG tablet Take 1 tablet (500 mg total) by mouth 2 (two) times daily. 01/28/12   Eugenie Filler, MD  lisinopril (PRINIVIL,ZESTRIL) 10 MG tablet Take 10 mg by mouth daily.    Historical Provider, MD  predniSONE (DELTASONE) 20 MG tablet Take 1 tablet (20 mg total) by mouth 2 (two) times daily. 09/30/12   Richarda Blade, MD   BP 122/69  Pulse 108  Temp(Src) 97.7 F (36.5 C) (Axillary)  Resp 29  SpO2 100% Physical Exam  Nursing note and vitals reviewed. Constitutional: She is oriented to person, place, and time.  Frail, chronically ill-appearing, respiratory distress  HENT:  Head: Normocephalic and atraumatic.  Mucous membranes dry  Eyes: Pupils are equal, round, and reactive to light.   Neck: Neck supple.  Tracheostomy stoma over the anterior neck, drainage noted from the site  Cardiovascular: Regular rhythm and normal heart sounds.   Tachycardia  Pulmonary/Chest: She is in respiratory distress. She has wheezes.  Increased work of breathing, tachypnea, poor air movement, scant wheezing noted in the upper lung fields  Abdominal: Soft. Bowel sounds are normal. There is no tenderness. There is no rebound.  Musculoskeletal: She exhibits no edema.  Neurological: She is alert and oriented to person, place, and time.  Skin: Skin is warm and dry.  Psychiatric: She has a normal mood and affect.    ED Course  Procedures (including critical care time)\  CRITICAL CARE Performed by: Thayer Jew, F   Total critical care time: 45 min  Critical care time was  exclusive of separately billable procedures and treating other patients.  Critical care was necessary to treat or prevent imminent or life-threatening deterioration.  Critical care was time spent personally by me on the following activities: development of treatment plan with patient and/or surrogate as well as nursing, discussions with consultants, evaluation of patient's response to treatment, examination of patient, obtaining history from patient or surrogate, ordering and performing treatments and interventions, ordering and review of laboratory studies, ordering and review of radiographic studies, pulse oximetry and re-evaluation of patient's condition.  Labs Review Labs Reviewed  CBC WITH DIFFERENTIAL - Abnormal; Notable for the following:    WBC 13.3 (*)    Hemoglobin 16.2 (*)    HCT 46.2 (*)    MCH 34.5 (*)    Platelets 113 (*)    Neutrophils Relative % 79 (*)    Neutro Abs 10.4 (*)    All other components within normal limits  BASIC METABOLIC PANEL - Abnormal; Notable for the following:    Glucose, Bld 121 (*)    Creatinine, Ser 0.48 (*)    All other components within normal limits  I-STAT ARTERIAL  BLOOD GAS, ED - Abnormal; Notable for the following:    pH, Arterial 7.296 (*)    pCO2 arterial 62.4 (*)    pO2, Arterial 180.0 (*)    Bicarbonate 30.4 (*)    All other components within normal limits  CULTURE, BLOOD (ROUTINE X 2)  CULTURE, BLOOD (ROUTINE X 2)  I-STAT CG4 LACTIC ACID, ED    Imaging Review Dg Chest Portable 1 View  01/10/2014   CLINICAL DATA:  Shortness of breath.  EXAM: PORTABLE CHEST - 1 VIEW  COMPARISON:  Chest radiograph performed 09/29/2012  FINDINGS: The lungs are hyperexpanded, with flattening of the hemidiaphragms, compatible with COPD.  No focal consolidation, pleural effusion or pneumothorax is seen. The cardiomediastinal silhouette is normal in size.  A ventriculoperitoneal shunt is partially imaged overlying the right hemithorax. No acute osseous abnormalities are seen.  IMPRESSION: Findings of COPD.  No acute cardiopulmonary process seen.   Electronically Signed   By: Garald Balding M.D.   On: 01/10/2014 02:49     EKG Interpretation None      MDM   Final diagnoses:  Respiratory distress  COPD exacerbation  Acute on chronic respiratory failure with hypercapnia    Patient presents with shortness of breath worsening over the last day. On initial evaluation she is in respiratory distress with increased work of breathing and tachypnea. She is currently getting a duo neb. IV access was established. Given patient's work of breathing and poor air movement, patient was placed on BiPAP. ABG shows a respiratory acidosis with hypercapnia.  Patient is afebrile and the colon however, blood cultures and lactate were obtained. Lactate is normal. Chest x-ray is without infiltrate. Patient was given IV Solu-Medrol, magnesium, and Levaquin for presumed COPD exacerbation.  On recheck, she is more comfortable on BiPAP. She continues to have poor air movement. Will admit for COPD exacerbation.    Merryl Hacker, MD 01/10/14 [phone]

## 2014-01-10 NOTE — Progress Notes (Signed)
Patient kept removing BiPap and finally refused to wear it.  Patient was placed on 2L  and has an oxygen saturation of 97%.  RT notified.

## 2014-01-10 NOTE — H&P (Signed)
[facility] History and Physical  [name]:[id number] DOB: 09-23-1955 DOA: 01/10/2014  Referring physician: ER physician. PCP: Saralyn Pilar   Chief Complaint: Shortness of breath.  HPI: Wanda Prince is a 58 y.o. female with history of COPD, ongoing tobacco abuse, previous history of intracranial bleed status post VP shunt placement, nonischemic cardiomyopathy presents to the ER because of worsening shortness of breath. Patient states she has been having shortness of breath off and on for last few days but last night it worsened and came to the ER. In the ER patient was found to be acutely short of breath and was placed on BiPAP. ABG initially showed pH of 7.29 with a PCO2 of 62.4 which improved to 7.31 and PCO2 of 52.9 with BiPAP. Patient still mildly short of breath. Denies any chest pain productive cough fever chills nausea vomiting abdominal pain or diarrhea. Patient has been noticing increasing bruises on the extremities. Patient has been admitted for acute respiratory failure probably from COPD.   Review of Systems: As presented in the history of presenting illness, rest negative.  Past Medical History  Diagnosis Date  . Meningitis 2009  . Hx of ventricular shunt 2009  . Seizure disorder   . 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 Suicide attempt     History of suicide attempts August 2006 and July 2006  . History of alcohol abuse   . History of cocaine abuse   . Major depressive disorder     History of  . History of iron deficiency   . COPD (chronic obstructive pulmonary disease)   . History of acute alcoholic hepatitis   . Psychiatric diagnosis     Multiple  . 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 Left breast mass    Past Surgical History  Procedure Laterality Date  . Craniotomy    . Breast  mass removal     Social History:  reports that she has been smoking Cigarettes.  She has been smoking about 1.00 pack per day. She does not have any smokeless tobacco history on file. She reports that she drinks alcohol. She reports that she uses illicit drugs (Cocaine). Where does patient live home. Can patient participate in ADLs? Yes.  No Known Allergies  Family History:  Family History  Problem Relation Age of Onset  . Coronary artery disease Father       Prior to Admission medications   Medication Sig Start Date End Date Taking? Authorizing Provider  acetaminophen (TYLENOL) 500 MG tablet Take 1,000 mg by mouth every 6 (six) hours as needed for pain.   Yes Historical Provider, MD  albuterol (PROVENTIL HFA;VENTOLIN HFA) 108 (90 BASE) MCG/ACT inhaler Inhale 2 puffs into the lungs 2 (two) times daily as needed for wheezing or shortness of breath.   Yes Historical Provider, MD  albuterol (PROVENTIL) (2.5 MG/3ML) 0.083% nebulizer solution Take 2.5 mg by nebulization every 6 (six) hours as needed for wheezing or shortness of breath.   Yes Historical Provider, MD  aspirin 81 MG chewable tablet Chew 81 mg by mouth daily.   Yes Historical Provider, MD  b complex vitamins tablet Take 1 tablet by mouth 2 (two) times daily. 01/28/12  Yes Wanda Filler, MD  diclofenac sodium (VOLTAREN) 1 % GEL Apply 4 g topically 4 (four) times  daily as needed (for muscle pain).   Yes Historical Provider, MD  Fluticasone-Salmeterol (ADVAIR) 250-50 MCG/DOSE AEPB Inhale 1 puff into the lungs every 12 (twelve) hours. 06/07/11  Yes Valaria Good, MD  folic acid (FOLVITE) 1 MG tablet Take 1 mg by mouth daily.   Yes Historical Provider, MD  gabapentin (NEURONTIN) 300 MG capsule Take 300 mg by mouth daily.   Yes Historical Provider, MD  ibuprofen (ADVIL,MOTRIN) 200 MG tablet Take 400 mg by mouth daily as needed for pain.   Yes Historical Provider, MD  metoprolol succinate (TOPROL-XL) 50 MG 24 hr tablet Take 50 mg by  mouth 2 (two) times daily. Take with or immediately following a meal.   Yes Historical Provider, MD  Multiple Vitamin (MULITIVITAMIN WITH MINERALS) TABS Take 1 tablet by mouth daily. 06/07/11  Yes Valaria Good, MD  phenytoin (DILANTIN) 100 MG ER capsule Take 100 mg by mouth 2 (two) times daily.    Yes Historical Provider, MD  PRESCRIPTION MEDICATION Take by mouth 2 (two) times daily. For ammonia in liver   Yes Historical Provider, MD  thiamine 100 MG tablet Take 1 tablet (100 mg total) by mouth daily. 06/07/11  Yes Valaria Good, MD  tiotropium (SPIRIVA) 18 MCG inhalation capsule Place 1 capsule (18 mcg total) into inhaler and inhale daily. 06/07/11  Yes Valaria Good, MD  Vitamin D, Ergocalciferol, (DRISDOL) 50000 UNITS CAPS Take 50,000 Units by mouth every 7 (seven) days. On Mondays   Yes Historical Provider, MD  levETIRAcetam (KEPPRA) 500 MG tablet Take 1 tablet (500 mg total) by mouth 2 (two) times daily. 01/28/12   Wanda Filler, MD  lisinopril (PRINIVIL,ZESTRIL) 10 MG tablet Take 10 mg by mouth daily.    Historical Provider, MD  predniSONE (DELTASONE) 20 MG tablet Take 1 tablet (20 mg total) by mouth 2 (two) times daily. 09/30/12   Richarda Blade, MD    Physical Exam: Filed Vitals:   01/10/14 7510 01/10/14 0259 01/10/14 0336  BP: 122/69    Pulse: 111  108  Temp: 97.7 F (36.5 C)    TempSrc: Axillary    Resp: 23  29  SpO2: 100% 100% 100%     General:  Moderately built and poorly nourished.  Eyes: Anicteric no pallor.  ENT: No discharge from the ears eyes nose mouth.  Neck: No mass felt. Small opening where trach was placed.  Cardiovascular: S1-S2 heard.  Respiratory: Distant breath sounds with bilateral wheezing. No crepitations.  Abdomen: Soft nontender bowel sounds present.   Skin: Multiple bruises.  Musculoskeletal: No edema.  Psychiatric: Appears normal.  Neurologic: Alert awake oriented to time place and person. Moves all extremities.  Labs on Admission:   Basic Metabolic Panel:  Recent Labs Lab 01/10/14 0208  NA 140  K 4.5  CL 100  CO2 26  GLUCOSE 121*  BUN 7  CREATININE 0.48*  CALCIUM 9.2   Liver Function Tests: No results found for this basename: AST, ALT, ALKPHOS, BILITOT, PROT, ALBUMIN,  in the last 168 hours No results found for this basename: LIPASE, AMYLASE,  in the last 168 hours No results found for this basename: AMMONIA,  in the last 168 hours CBC:  Recent Labs Lab 01/10/14 0208  WBC 13.3*  NEUTROABS 10.4*  HGB 16.2*  HCT 46.2*  MCV 98.5  PLT 113*   Cardiac Enzymes: No results found for this basename: CKTOTAL, CKMB, CKMBINDEX, TROPONINI,  in the last 168 hours  BNP (last 3 results) No  results found for this basename: PROBNP,  in the last 8760 hours CBG: No results found for this basename: GLUCAP,  in the last 168 hours  Radiological Exams on Admission: Dg Chest Portable 1 View  01/10/2014   CLINICAL DATA:  Shortness of breath.  EXAM: PORTABLE CHEST - 1 VIEW  COMPARISON:  Chest radiograph performed 09/29/2012  FINDINGS: The lungs are hyperexpanded, with flattening of the hemidiaphragms, compatible with COPD.  No focal consolidation, pleural effusion or pneumothorax is seen. The cardiomediastinal silhouette is normal in size.  A ventriculoperitoneal shunt is partially imaged overlying the right hemithorax. No acute osseous abnormalities are seen.  IMPRESSION: Findings of COPD.  No acute cardiopulmonary process seen.   Electronically Signed   By: Garald Balding M.D.   On: 01/10/2014 02:49     Assessment/Plan Principal Problem:   Acute respiratory failure with hypercapnia Active Problems:   COPD with acute exacerbation   Hypertension   Seizure   Acute respiratory failure   1. Acute respiratory failure with hypercarbia - patient has been placed on BiPAP which will be continued for now. Closely follow her respiratory status in step down unit. Continue with IV steroids nebulizer Pulmicort and  antibiotic. 2. History of nonischemic cardiomyopathy - patient's symptoms appear more likely secondary to COPD. BNP is pending. 3. History of seizures - continue home medications. Patient is on Keppra and Dilantin. Dilantin levels are pending. 4. History of alcohol abuse and tobacco abuse - patient advised to quit these habits. On alcohol withdrawal protocol with when necessary Ativan and thiamine. 5. Hypertension - continue home medications. 6. History of intractable bleed status post VP shunt placement.    Code Status: Full code.  Family Communication: None.  Disposition Plan: Admit to inpatient.    Latrease Kunde N. [facility] Pager [phone].  If 7PM-7AM, please contact night-coverage [email or web address] Password [email or web address] 01/10/2014, 5:13 AM

## 2014-01-11 DIAGNOSIS — I519 Heart disease, unspecified: Secondary | ICD-10-CM

## 2014-01-11 LAB — COMPREHENSIVE METABOLIC PANEL
ALT: 62 U/L — ABNORMAL HIGH (ref 0–35)
AST: 54 U/L — ABNORMAL HIGH (ref 0–37)
Albumin: 2.8 g/dL — ABNORMAL LOW (ref 3.5–5.2)
Alkaline Phosphatase: 169 U/L — ABNORMAL HIGH (ref 39–117)
Anion gap: 9 (ref 5–15)
BUN: 14 mg/dL (ref 6–23)
CO2: 27 mEq/L (ref 19–32)
Calcium: 8.4 mg/dL (ref 8.4–10.5)
Chloride: 103 mEq/L (ref 96–112)
Creatinine, Ser: 0.42 mg/dL — ABNORMAL LOW (ref 0.50–1.10)
GFR calc Af Amer: >90 mL/min (ref >90)
GFR calc non Af Amer: >90 mL/min (ref >90)
Glucose, Bld: 128 mg/dL — ABNORMAL HIGH (ref 70–99)
Potassium: 5.1 mEq/L (ref 3.7–5.3)
Sodium: 139 mEq/L (ref 137–147)
Total Bilirubin: 0.2 mg/dL — ABNORMAL LOW (ref 0.3–1.2)
Total Protein: 6.4 g/dL (ref 6.0–8.3)

## 2014-01-11 LAB — CBC
HCT: 37.5 % (ref 36.0–46.0)
Hemoglobin: 12.6 g/dL (ref 12.0–15.0)
MCH: 33 pg (ref 26.0–34.0)
MCHC: 33.6 g/dL (ref 30.0–36.0)
MCV: 98.2 fL (ref 78.0–100.0)
Platelets: 123 10*3/uL — ABNORMAL LOW (ref 150–400)
RBC: 3.82 MIL/uL — ABNORMAL LOW (ref 3.87–5.11)
RDW: 12.9 % (ref 11.5–15.5)
WBC: 14.1 10*3/uL — ABNORMAL HIGH (ref 4.0–10.5)

## 2014-01-11 LAB — T4, FREE: Free T4: 1.03 ng/dL (ref 0.80–1.80)

## 2014-01-11 LAB — AMMONIA: Ammonia: 67 umol/L — ABNORMAL HIGH (ref 11–60)

## 2014-01-11 MED ORDER — IPRATROPIUM-ALBUTEROL 0.5-2.5 (3) MG/3ML IN SOLN
3.0000 mL | Freq: Four times a day (QID) | RESPIRATORY_TRACT | Status: DC
  Administered 2014-01-11 – 2014-01-13 (×8): 3 mL via RESPIRATORY_TRACT
  Filled 2014-01-11 (×8): qty 3

## 2014-01-11 MED ORDER — LORAZEPAM 2 MG/ML IJ SOLN
2.0000 mg | INTRAMUSCULAR | Status: DC | PRN
  Administered 2014-01-11 (×2): 2 mg via INTRAVENOUS
  Filled 2014-01-11 (×2): qty 1

## 2014-01-11 MED ORDER — METHYLPREDNISOLONE SODIUM SUCC 125 MG IJ SOLR
60.0000 mg | Freq: Two times a day (BID) | INTRAMUSCULAR | Status: DC
  Filled 2014-01-11: qty 0.96

## 2014-01-11 MED ORDER — THIAMINE HCL 100 MG/ML IJ SOLN: 100.0000 mg | Freq: Every day | INTRAMUSCULAR | Status: DC

## 2014-01-11 MED ORDER — FOLIC ACID 5 MG/ML IJ SOLN: 1.0000 mg | Freq: Every day | INTRAMUSCULAR | Status: DC

## 2014-01-11 MED ORDER — METHYLPREDNISOLONE SODIUM SUCC 40 MG IJ SOLR
40.0000 mg | Freq: Two times a day (BID) | INTRAMUSCULAR | Status: AC
  Administered 2014-01-11 – 2014-01-12 (×3): 40 mg via INTRAVENOUS
  Filled 2014-01-11 (×4): qty 1

## 2014-01-11 MED ORDER — LORAZEPAM 2 MG/ML IJ SOLN
1.0000 mg | Freq: Once | INTRAMUSCULAR | Status: AC
  Administered 2014-01-11: 1 mg via INTRAVENOUS

## 2014-01-11 NOTE — Progress Notes (Signed)
Impulsive; constantly getting out of bed; bed alarm ringing. Medicated with Ativan by primary nurse. Reminded frequently to stay in bed. Dr. Thereasa Solo notified and order given for sitter. In the mean time unit tech will be placed at the bedside for safety. House coverage and staffing notified with order.

## 2014-01-11 NOTE — Progress Notes (Signed)
  Echocardiogram 2D Echocardiogram has been performed.  Wanda Prince 01/11/2014, 10:14 AM

## 2014-01-11 NOTE — Progress Notes (Signed)
[facility]  AI SONNENFELD URK:[id number] DOB: [birth year] DOA: 01/10/2014 PCP: Saralyn Pilar  Brief narrative: 58 year old female patient with known COPD and ongoing tobacco abuse. 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 She presented to the emergency department with complaints of shortness of breath intermittently for several days.   After arrival to the emergency department the patient was acutely short of breath and was placed on BiPAP. ABG was consistent with respiratory acidosis with a pH of 7.29 PCO2 of 62. She denied chest pain, productive cough, fever, chills, nausea, vomiting or abdominal pain or diarrhea. She reports noticing increased bruising of extremities. Renal function was normal, she did have mild transaminitis with a normal total bilirubin, showed mild leukocytosis 13,300, she also had mild thrombocytopenia with a platelet count of 113,000 which is at her normal baseline. Her chest x-ray showed COPD without evidence of focal infiltrate consistent with pneumonia.  On 9/15 patient's respiratory symptoms improved but unfortunately she developed symptoms of acute delirium that appear to be consistent with alcohol withdrawal. She had been started on a CIWA protocol at admission but it appears the frequency of Ativan dosing was not adequate therefore a step down Ativan CIWA protocol was initiated.  HPI/Subjective: Alert but very confused.  Agitated.  Attempting to get out of bed.    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 underlying cirrhosis Now with acute delirium and some hallucinatory-type behavior so suspect beginning to develop acute alcohol withdrawal -Continue CIWA protocol but increase frequency of Ativan dosing to a step down unit protocol-low magnesium previously replaced-ck ammonia level    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 Compensated at this time-last inpatient echocardiogram was in 2013 with an EF of 30-35% -ECHO this admit now shows preserved LV fnx and grade 1 DD      Coronary artery disease, non-occlusive Currently asymptomatic-see above regarding echocardiogram    Hypertension Continue Prinivil and Toprol     Tobacco use Counseled regarding cessation-nicotine patch    Seizure disorder Continue Dilantin and Keppra-Dilantin level therapeutic-Levetiracetam level pending  DVT prophylaxis: Lovenox Code Status: Full Family Communication: No family at bedside Disposition Plan/Expected LOS: Stepdown due to evolving alcohol withdrawal despite initiation of lower dose CIWA protocol at presentation  Consultants: None   Procedures: 2-D echocardiogram - Left ventricle: Systolic function was normal. The estimated ejection fraction was in the range of 60% to 65%. There was an increased relative contribution of atrial contraction to ventricular filling. Doppler parameters are consistent with abnormal left ventricular relaxation (grade 1 diastolic dysfunction). Impressions:- Incomplete study. Patient refused to cooperate.  Antibiotics: Levaquin 9/13 >  Objective: Blood pressure 114/70, pulse 85, temperature 97.9 F (36.6 C), temperature source Oral, resp. rate 28, height 5\' 8"  (1.727 m), weight 115 lb 15.4 oz (52.6 kg), SpO2 97.00%.  Intake/Output Summary (Last 24 hours) at  01/11/14 1110 Last data filed at 01/11/14 0858  Gross per 24  hour  Intake    340 ml  Output    150 ml  Net    190 ml    Exam: Gen: No acute respiratory distress but noted with acute delirium symptoms as evidenced by confusion and apparent hallucinatory behavior Chest: Bilateral lung sounds with a few expiratory wheezes and marked improvement in air movement, 1 L Cardiac: Regular rate and rhythm, S1-S2, no rubs murmurs or gallops, no peripheral edema, no JVD Abdomen: Soft nontender nondistended, no ascites Extremities: Symmetrical in appearance without cyanosis, clubbing or effusion  Scheduled Meds:  Scheduled Meds: . albuterol  2.5 mg Nebulization QID  . aspirin  81 mg Oral Daily  . B-complex with vitamin C  1 tablet Oral Daily  . budesonide (PULMICORT) nebulizer solution  0.25 mg Nebulization BID  . enoxaparin (LOVENOX) injection  40 mg Subcutaneous Q24H  . folic acid  1 mg Oral Daily  . gabapentin  300 mg Oral Daily  . ipratropium  0.5 mg Nebulization QID  . levETIRAcetam  500 mg Oral BID  . levofloxacin (LEVAQUIN) IV  500 mg Intravenous Q24H  . lisinopril  10 mg Oral Daily  . methylPREDNISolone (SOLU-MEDROL) injection  60 mg Intravenous Q12H  . metoprolol succinate  50 mg Oral BID  . multivitamin with minerals  1 tablet Oral Daily  . nicotine  21 mg Transdermal Daily  . phenytoin  100 mg Oral BID  . sodium chloride  3 mL Intravenous Q12H  . thiamine  100 mg Oral Daily  . Vitamin D (Ergocalciferol)  50,000 Units Oral Q7 days   Data Reviewed: Basic Metabolic Panel:  Recent Labs Lab 01/10/14 0208 01/10/14 1048 01/11/14 0254  NA 140 139 139  K 4.5 4.1 5.1  CL 100 100 103  CO2 26 26 27   GLUCOSE 121* 134* 128*  BUN 7 8 14   CREATININE 0.48* 0.49* 0.42*  CALCIUM 9.2 9.0 8.4   Liver Function Tests:  Recent Labs Lab 01/10/14 1048 01/11/14 0254  AST 68* 54*  ALT 77* 62*  ALKPHOS 185* 169*  BILITOT 0.4 0.2*  PROT 7.0 6.4  ALBUMIN 3.0* 2.8*   CBC:  Recent  Labs Lab 01/10/14 0208 01/10/14 1048 01/11/14 0254  WBC 13.3* 13.9* 14.1*  NEUTROABS 10.4* 12.5*  --   HGB 16.2* 13.5 12.6  HCT 46.2* 40.7 37.5  MCV 98.5 101.8* 98.2  PLT 113* 115* 123*   Cardiac Enzymes:  Recent Labs Lab 01/10/14 1048  TROPONINI <0.30   BNP (last 3 results)  Recent Labs  01/10/14 1048  PROBNP 164.6*    Recent Results (from the past 240 hour(s))  CULTURE, BLOOD (ROUTINE X 2)     Status: None   Collection Time    01/10/14  2:08 AM      Result Value Ref Range Status   Specimen Description BLOOD RIGHT UPPER ARM   Final   Special Requests BOTTLES DRAWN AEROBIC AND ANAEROBIC 5CC EACH   Final   Culture  Setup Time     Final   Value: 01/10/2014 08:02     Performed at [facility]   Culture     Final   Value:        BLOOD CULTURE RECEIVED NO GROWTH TO DATE CULTURE WILL BE HELD FOR 5 DAYS BEFORE ISSUING A FINAL NEGATIVE REPORT     Performed at [facility]   Report Status PENDING   Incomplete  CULTURE, BLOOD (ROUTINE X 2)     Status: None  Collection Time    01/10/14  2:22 AM      Result Value Ref Range Status   Specimen Description BLOOD RIGHT HAND   Final   Special Requests BOTTLES DRAWN AEROBIC ONLY 3CC   Final   Culture  Setup Time     Final   Value: 01/10/2014 08:03     Performed at [facility]   Culture     Final   Value:        BLOOD CULTURE RECEIVED NO GROWTH TO DATE CULTURE WILL BE HELD FOR 5 DAYS BEFORE ISSUING A FINAL NEGATIVE REPORT     Performed at [facility]   Report Status PENDING   Incomplete  MRSA PCR SCREENING     Status: None   Collection Time    01/10/14  6:08 AM      Result Value Ref Range Status   MRSA by PCR NEGATIVE  NEGATIVE Final   Comment:            The GeneXpert MRSA Assay (FDA     approved for NASAL specimens     only), is one component of a     comprehensive MRSA colonization     surveillance program. It is not     intended to diagnose MRSA     infection nor to guide or      monitor treatment for     MRSA infections.     Studies:  Recent x-ray studies have been reviewed in detail by the Attending Physician  Time spent :  Divide, ANP [facility] Office  [phone] Pager [phone]   **If unable to reach the above provider after paging please contact the Rudy @ [phone]  On-Call/Text Page:      [email or web address]      password [email or web address]  If 7PM-7AM, please contact night-coverage [email or web address] Password [email or web address] 01/11/2014, 11:10 AM   LOS: 1 day   I have personally examined this patient and reviewed the entire database. I have reviewed the above note, made any necessary editorial changes, and agree with its content.  Cherene Altes, MD [facility]

## 2014-01-11 NOTE — Progress Notes (Signed)
Family member at bedside stated pt is on medication three times a day for elevated Ammonia. Reported off to Dr. Thereasa Solo of family's concern. New order placed for Ammonia lab level.  Ammonia Level 67, Notified Dr. Thereasa Solo of pt results.Will continue to monitor pt.

## 2014-01-12 LAB — COMPREHENSIVE METABOLIC PANEL
ALT: 63 U/L — ABNORMAL HIGH (ref 0–35)
AST: 52 U/L — ABNORMAL HIGH (ref 0–37)
Albumin: 2.9 g/dL — ABNORMAL LOW (ref 3.5–5.2)
Alkaline Phosphatase: 170 U/L — ABNORMAL HIGH (ref 39–117)
Anion gap: 12 (ref 5–15)
BUN: 13 mg/dL (ref 6–23)
CO2: 27 mEq/L (ref 19–32)
Calcium: 8.8 mg/dL (ref 8.4–10.5)
Chloride: 99 mEq/L (ref 96–112)
Creatinine, Ser: 0.42 mg/dL — ABNORMAL LOW (ref 0.50–1.10)
GFR calc Af Amer: >90 mL/min (ref >90)
GFR calc non Af Amer: >90 mL/min (ref >90)
Glucose, Bld: 116 mg/dL — ABNORMAL HIGH (ref 70–99)
Potassium: 5.1 mEq/L (ref 3.7–5.3)
Sodium: 138 mEq/L (ref 137–147)
Total Bilirubin: <0.2 mg/dL — ABNORMAL LOW (ref 0.3–1.2)
Total Protein: 6.6 g/dL (ref 6.0–8.3)

## 2014-01-12 LAB — LEVETIRACETAM LEVEL: Levetiracetam Lvl: 21.8 ug/mL

## 2014-01-12 LAB — CBC
HCT: 40.4 % (ref 36.0–46.0)
Hemoglobin: 13.9 g/dL (ref 12.0–15.0)
MCH: 35.1 pg — ABNORMAL HIGH (ref 26.0–34.0)
MCHC: 34.4 g/dL (ref 30.0–36.0)
MCV: 102 fL — ABNORMAL HIGH (ref 78.0–100.0)
Platelets: 130 10*3/uL — ABNORMAL LOW (ref 150–400)
RBC: 3.96 MIL/uL (ref 3.87–5.11)
RDW: 13 % (ref 11.5–15.5)
WBC: 12.9 10*3/uL — ABNORMAL HIGH (ref 4.0–10.5)

## 2014-01-12 LAB — PROTIME-INR
INR: 1.01 (ref 0.00–1.49)
Prothrombin Time: 13.3 seconds (ref 11.6–15.2)

## 2014-01-12 LAB — AMMONIA: Ammonia: 56 umol/L (ref 11–60)

## 2014-01-12 MED ORDER — LACTULOSE 10 GM/15ML PO SOLN
10.0000 g | Freq: Two times a day (BID) | ORAL | Status: DC
  Administered 2014-01-12 – 2014-01-16 (×9): 10 g via ORAL
  Filled 2014-01-12 (×10): qty 15

## 2014-01-12 MED ORDER — PREDNISONE 20 MG PO TABS
20.0000 mg | ORAL_TABLET | Freq: Two times a day (BID) | ORAL | Status: DC
  Administered 2014-01-13 – 2014-01-15 (×5): 20 mg via ORAL
  Filled 2014-01-12 (×7): qty 1

## 2014-01-12 MED ORDER — ACETAMINOPHEN 325 MG PO TABS
650.0000 mg | ORAL_TABLET | Freq: Four times a day (QID) | ORAL | Status: DC | PRN
  Administered 2014-01-12 – 2014-01-16 (×8): 650 mg via ORAL
  Filled 2014-01-12 (×8): qty 2

## 2014-01-12 MED ORDER — CETYLPYRIDINIUM CHLORIDE 0.05 % MT LIQD
7.0000 mL | Freq: Two times a day (BID) | OROMUCOSAL | Status: DC
  Administered 2014-01-12 – 2014-01-16 (×6): 7 mL via OROMUCOSAL

## 2014-01-12 NOTE — Progress Notes (Signed)
Utilization Review Completed.  

## 2014-01-12 NOTE — Progress Notes (Signed)
Pt family concerned about old trach wound, said it was draining at home. Minimal Drainage noted upon assessment today. Will pass on, will continue to monitor drainage. Cedricka Sackrider V, RN 01/12/2014 6:30 PM

## 2014-01-12 NOTE — Progress Notes (Signed)
[facility]  Wanda Prince TDH:[id number] DOB: 1955/05/23 DOA: 01/10/2014 PCP: Saralyn Pilar  Brief narrative: 58 year old female patient with known COPD and ongoing tobacco abuse. 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 She presented to the emergency department with complaints of shortness of breath intermittently for several days.   After arrival to the emergency department the patient was acutely short of breath and was placed on BiPAP. ABG was consistent with respiratory acidosis with a pH of 7.29 PCO2 of 62. She denied chest pain, productive cough, fever, chills, nausea, vomiting or abdominal pain or diarrhea. She reports noticing increased bruising of extremities. Renal function was normal, she did have mild transaminitis with a normal total bilirubin, showed mild leukocytosis 13,300, she also had mild thrombocytopenia with a platelet count of 113,000 which is at her normal baseline. Her chest x-ray showed COPD without evidence of focal infiltrate consistent with pneumonia.  On 9/15 patient's respiratory symptoms improved but unfortunately she developed symptoms of acute delirium that appear to be consistent with alcohol withdrawal. She had been started on a CIWA protocol at admission but it appears the frequency of Ativan dosing was not adequate therefore a step down Ativan CIWA protocol was initiated.  HPI/Subjective: Awakens but remains confused. Less agitated.  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 underlying cirrhosis Acute delirium and some hallucinatory-type behavior on 9/15 due to acute alcohol withdrawal/sx's have improved -Continue CIWA protocol -low magnesium previously replaced-ck ammonia level in serial fashion     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 Compensated at this time-last inpatient echocardiogram was in 2013 with an EF of 30-35% -ECHO this admit now shows preserved LV fnx and grade 1 DD  Constipation Lactulose low dose BID      Coronary artery disease, non-occlusive Currently asymptomatic-see above regarding echocardiogram    Hypertension Continue Prinivil and Toprol     Tobacco use Counseled regarding cessation-nicotine patch    Seizure disorder Continue Dilantin and Keppra-Dilantin level therapeutic-Levetiracetam normal  DVT prophylaxis: Lovenox Code Status: Full Family Communication: No family at bedside Disposition Plan/Expected LOS: Stepdown due to alcohol withdrawal   Consultants: None   Procedures: 2-D echocardiogram - Left ventricle: Systolic function was normal. The estimated ejection fraction was in the range of 60% to 65%. There was an increased relative contribution of atrial contraction to ventricular filling. Doppler parameters are consistent with abnormal left ventricular relaxation (grade 1 diastolic dysfunction). Impressions:- Incomplete study. Patient refused to cooperate.  Antibiotics: Levaquin 9/13 >  Objective: Blood pressure 117/66, pulse 77, temperature 97.8 F (36.6 C), temperature source Oral, resp. rate 20, height 5\' 8"  (1.727 m), weight 115 lb 15.4 oz (52.6 kg), SpO2 95.00%.  Intake/Output Summary (Last 24 hours) at 01/12/14 1423 Last data filed at 01/12/14 1358  Gross per 24 hour  Intake    560 ml  Output      1 ml  Net    559 ml  Exam: Gen: No acute respiratory distress  Chest: course exp wheeze persists diffusely - no focal crackles  Cardiac: Regular rate and rhythm, S1-S2, no rubs murmurs or gallops, no peripheral edema Abdomen: Soft nontender nondistended, no ascites Extremities: Symmetrical in appearance without cyanosis, clubbing or effusion  Scheduled Meds:  Scheduled Meds: . antiseptic oral rinse  7 mL Mouth Rinse BID  . aspirin  81 mg Oral Daily  . B-complex with vitamin C  1 tablet Oral Daily  . budesonide (PULMICORT) nebulizer solution  0.25 mg Nebulization BID  . enoxaparin (LOVENOX) injection  40 mg Subcutaneous Q24H  . folic acid  1 mg Oral Daily  . gabapentin  300 mg Oral Daily  . ipratropium-albuterol  3 mL Nebulization QID  . levETIRAcetam  500 mg Oral BID  . levofloxacin (LEVAQUIN) IV  500 mg Intravenous Q24H  . lisinopril  10 mg Oral Daily  . methylPREDNISolone (SOLU-MEDROL) injection  40 mg Intravenous Q12H  . metoprolol succinate  50 mg Oral BID  . multivitamin with minerals  1 tablet Oral Daily  . nicotine  21 mg Transdermal Daily  . phenytoin  100 mg Oral BID  . sodium chloride  3 mL Intravenous Q12H  . thiamine  100 mg Oral Daily  . Vitamin D (Ergocalciferol)  50,000 Units Oral Q7 days   Data Reviewed: Basic Metabolic Panel:  Recent Labs Lab 01/10/14 0208 01/10/14 1048 01/11/14 0254 01/12/14 0220  NA 140 139 139 138  K 4.5 4.1 5.1 5.1  CL 100 100 103 99  CO2 26 26 27 27   GLUCOSE 121* 134* 128* 116*  BUN 7 8 14 13   CREATININE 0.48* 0.49* 0.42* 0.42*  CALCIUM 9.2 9.0 8.4 8.8   Liver Function Tests:  Recent Labs Lab 01/10/14 1048 01/11/14 0254 01/12/14 0220  AST 68* 54* 52*  ALT 77* 62* 63*  ALKPHOS 185* 169* 170*  BILITOT 0.4 0.2* <0.2*  PROT 7.0 6.4 6.6  ALBUMIN 3.0* 2.8* 2.9*   CBC:  Recent Labs Lab 01/10/14 0208 01/10/14 1048 01/11/14 0254 01/12/14 0220  WBC 13.3* 13.9* 14.1* 12.9*  NEUTROABS 10.4* 12.5*  --   --   HGB 16.2* 13.5 12.6 13.9   HCT 46.2* 40.7 37.5 40.4  MCV 98.5 101.8* 98.2 102.0*  PLT 113* 115* 123* 130*   Cardiac Enzymes:  Recent Labs Lab 01/10/14 1048  TROPONINI <0.30    Recent Results (from the past 240 hour(s))  CULTURE, BLOOD (ROUTINE X 2)     Status: None   Collection Time    01/10/14  2:08 AM      Result Value Ref Range Status   Specimen Description BLOOD RIGHT UPPER ARM   Final   Special Requests BOTTLES DRAWN AEROBIC AND ANAEROBIC 5CC EACH   Final   Culture  Setup Time     Final   Value: 01/10/2014 08:02     Performed at [facility]   Culture     Final   Value:        BLOOD CULTURE RECEIVED NO GROWTH TO DATE CULTURE WILL BE HELD FOR 5 DAYS BEFORE ISSUING A FINAL NEGATIVE REPORT     Performed at [facility]   Report Status PENDING   Incomplete  CULTURE, BLOOD (ROUTINE X 2)     Status: None   Collection Time    01/10/14  2:22 AM      Result Value Ref Range Status   Specimen Description BLOOD RIGHT HAND   Final  Special Requests BOTTLES DRAWN AEROBIC ONLY 3CC   Final   Culture  Setup Time     Final   Value: 01/10/2014 08:03     Performed at [facility]   Culture     Final   Value:        BLOOD CULTURE RECEIVED NO GROWTH TO DATE CULTURE WILL BE HELD FOR 5 DAYS BEFORE ISSUING A FINAL NEGATIVE REPORT     Performed at [facility]   Report Status PENDING   Incomplete  MRSA PCR SCREENING     Status: None   Collection Time    01/10/14  6:08 AM      Result Value Ref Range Status   MRSA by PCR NEGATIVE  NEGATIVE Final   Comment:            The GeneXpert MRSA Assay (FDA     approved for NASAL specimens     only), is one component of a     comprehensive MRSA colonization     surveillance program. It is not     intended to diagnose MRSA     infection nor to guide or     monitor treatment for     MRSA infections.     Studies:  Recent x-ray studies have been reviewed in detail by the Attending Physician  Time spent :  [place],  ANP [facility] Office  [phone] Pager [phone]  On-Call/Text Page:      [email or web address]      password [email or web address]  If 7PM-7AM, please contact night-coverage [email or web address] Password [email or web address] 01/12/2014, 2:23 PM   LOS: 2 days   I have personally examined this patient and reviewed the entire database. I have reviewed the above note, made any necessary editorial changes, and agree with its content.  Cherene Altes, MD [facility]

## 2014-01-13 DIAGNOSIS — F1010 Alcohol abuse, uncomplicated: Secondary | ICD-10-CM

## 2014-01-13 DIAGNOSIS — F17200 Nicotine dependence, unspecified, uncomplicated: Secondary | ICD-10-CM

## 2014-01-13 DIAGNOSIS — I10 Essential (primary) hypertension: Secondary | ICD-10-CM

## 2014-01-13 DIAGNOSIS — I5042 Chronic combined systolic (congestive) and diastolic (congestive) heart failure: Secondary | ICD-10-CM

## 2014-01-13 LAB — CBC
HCT: 42.1 % (ref 36.0–46.0)
Hemoglobin: 14 g/dL (ref 12.0–15.0)
MCH: 33 pg (ref 26.0–34.0)
MCHC: 33.3 g/dL (ref 30.0–36.0)
MCV: 99.3 fL (ref 78.0–100.0)
Platelets: 141 10*3/uL — ABNORMAL LOW (ref 150–400)
RBC: 4.24 MIL/uL (ref 3.87–5.11)
RDW: 12.9 % (ref 11.5–15.5)
WBC: 7 10*3/uL (ref 4.0–10.5)

## 2014-01-13 LAB — AMMONIA: Ammonia: 37 umol/L (ref 11–60)

## 2014-01-13 LAB — BASIC METABOLIC PANEL
Anion gap: 10 (ref 5–15)
BUN: 12 mg/dL (ref 6–23)
CO2: 30 mEq/L (ref 19–32)
Calcium: 8.7 mg/dL (ref 8.4–10.5)
Chloride: 100 mEq/L (ref 96–112)
Creatinine, Ser: 0.5 mg/dL (ref 0.50–1.10)
GFR calc Af Amer: >90 mL/min (ref >90)
GFR calc non Af Amer: >90 mL/min (ref >90)
Glucose, Bld: 129 mg/dL — ABNORMAL HIGH (ref 70–99)
Potassium: 5.2 mEq/L (ref 3.7–5.3)
Sodium: 140 mEq/L (ref 137–147)

## 2014-01-13 MED ORDER — PHENYTOIN SODIUM EXTENDED 100 MG PO CAPS
100.0000 mg | ORAL_CAPSULE | Freq: Every evening | ORAL | Status: DC
  Administered 2014-01-13 – 2014-01-15 (×3): 100 mg via ORAL
  Filled 2014-01-13 (×4): qty 1

## 2014-01-13 MED ORDER — HYDROCOD POLST-CHLORPHEN POLST 10-8 MG/5ML PO LQCR
5.0000 mL | Freq: Two times a day (BID) | ORAL | Status: DC | PRN
  Administered 2014-01-13: 5 mL via ORAL
  Filled 2014-01-13 (×2): qty 5

## 2014-01-13 MED ORDER — PHENYTOIN SODIUM EXTENDED 100 MG PO CAPS
200.0000 mg | ORAL_CAPSULE | Freq: Every morning | ORAL | Status: DC
  Filled 2014-01-13: qty 2

## 2014-01-13 MED ORDER — IPRATROPIUM-ALBUTEROL 0.5-2.5 (3) MG/3ML IN SOLN
3.0000 mL | Freq: Two times a day (BID) | RESPIRATORY_TRACT | Status: DC
Start: 2014-01-13 — End: 2014-01-16
  Administered 2014-01-13 – 2014-01-16 (×6): 3 mL via RESPIRATORY_TRACT
  Filled 2014-01-13 (×6): qty 3

## 2014-01-13 MED ORDER — LEVOFLOXACIN 500 MG PO TABS
500.0000 mg | ORAL_TABLET | Freq: Every day | ORAL | Status: DC
  Administered 2014-01-14 – 2014-01-16 (×3): 500 mg via ORAL
  Filled 2014-01-13 (×3): qty 1

## 2014-01-13 MED ORDER — PHENYTOIN SODIUM EXTENDED 100 MG PO CAPS
200.0000 mg | ORAL_CAPSULE | Freq: Every morning | ORAL | Status: DC
  Administered 2014-01-14 – 2014-01-16 (×3): 200 mg via ORAL
  Filled 2014-01-13 (×3): qty 2

## 2014-01-13 MED ORDER — LORAZEPAM 2 MG/ML IJ SOLN
1.0000 mg | Freq: Four times a day (QID) | INTRAMUSCULAR | Status: AC | PRN
  Administered 2014-01-14: 1 mg via INTRAVENOUS
  Filled 2014-01-13: qty 1

## 2014-01-13 MED ORDER — LORAZEPAM 1 MG PO TABS
1.0000 mg | ORAL_TABLET | Freq: Four times a day (QID) | ORAL | Status: AC | PRN
  Administered 2014-01-14: 1 mg via ORAL
  Filled 2014-01-13: qty 1

## 2014-01-13 NOTE — Progress Notes (Addendum)
[facility]  Wanda Prince GEX:[id number] DOB: [birth year] DOA: 01/10/2014 PCP: Saralyn Pilar  Brief narrative: 58 year old WF PMHx noncompliance, COPD and ongoing tobacco abuse. 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 She presented to the emergency department with complaints of shortness of breath intermittently for several days.   After arrival to the emergency department the patient was acutely short of breath and was placed on BiPAP. ABG was consistent with respiratory acidosis with a pH of 7.29 PCO2 of 62. She denied chest pain, productive cough, fever, chills, nausea, vomiting or abdominal pain or diarrhea. She reports noticing increased bruising of extremities. Renal function was normal, she did have mild transaminitis with a normal total bilirubin, showed mild leukocytosis 13,300, she also had mild thrombocytopenia with a platelet count of 113,000 which is at her normal baseline. Her chest x-ray showed COPD without evidence of focal infiltrate consistent with pneumonia.  On 9/15 patient's respiratory symptoms improved but unfortunately she developed symptoms of acute delirium that appear to be consistent with alcohol withdrawal. She had been started on a CIWA protocol at admission but it appears the frequency of Ativan dosing was not adequate therefore a step down Ativan CIWA protocol was initiated.  As of 9/17 patient appears to have gone through the worst for alcohol withdrawal. Today she is alert and appropriate without any significant agitation. She endorses feeling much better. Has improved appetite.  HPI/Subjective: Alert and mostly oriented without agitation, still having cough which is productive and some mild pleuritic chest pain.  Assessment/Plan:     Acute respiratory failure with hypoxia and hypercapnia/COPD with acute exacerbation Symptoms improved but not quite back to  baseline-continue nebs-continue empiric Levaquin-on 9/17 Solu-Medrol transitioned to twice a day prednisone-continue IS/flutter valve   Acute delirium Suspect due to acute alcohol withdrawal but could also be exacerbated by steroids  - family reported pt on lactulose at home - ammonia initially mildly elevated but with hydration and resumption of lactulose now normal -mentation markedly improved as of 9/17  Previous tracheostomy Family concerned over minor serous drainage prior to admission-current site unremarkable with dressing in place    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 underlying cirrhosis Acute delirium and some hallucinatory-type behavior on 9/15 due to acute alcohol withdrawal/sx's have improved -Continue CIWA but now that mentation improved transition to MedSurg protocol -low magnesium previously replaced-ck ammonia level in serial fashion/continue lactulose    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 Compensated at this time-last inpatient echocardiogram was in 2013 with an EF of 30-35% -ECHO this admit now shows preserved LV fnx and grade 1 DD  Constipation Lactulose low dose BID initiated 9/16      Coronary artery disease, non-occlusive Currently asymptomatic-see above regarding echocardiogram    Hypertension Continue Prinivil and Toprol     Tobacco use Counseled regarding cessation-nicotine patch    Seizure disorder Continue Dilantin and Keppra-Dilantin level therapeutic-Levetiracetam normal  DVT prophylaxis: Lovenox Code Status: Full Family Communication: No family at bedside Disposition Plan/Expected LOS: Transfer to floor-PT/OT evaluations pending-concern patient may require either CIR or skilled nursing facility before discharge to home-may also require short-term home oxygen noting was not on oxygen prior to admission  Consultants: None   Procedures: 2-D echocardiogram - LVEF=  60% to  65%. -(grade 1 diastolic dysfunction). Impressions:- Incomplete study. Patient refused to cooperate.  Antibiotics: Levaquin 9/13 >  Objective: Blood pressure 117/71,  pulse 88, temperature 97.7 F (36.5 C), temperature source Oral, resp. rate 21, height 5\' 8"  (1.727 m), weight 115 lb 15.4 oz (52.6 kg), SpO2 99.00%.  Intake/Output Summary (Last 24 hours) at 01/13/14 1246 Last data filed at 01/13/14 0900  Gross per 24 hour  Intake    700 ml  Output      1 ml  Net    699 ml    Exam: Gen: No acute respiratory distress -much more alert and appropriate today Chest: Bilateral exp wheeze persists diffusely but not as course today - no focal crackles -remained stable on 2.5 L Cardiac: Regular rate and rhythm, S1-S2, no rubs murmurs or gallops, no peripheral edema Abdomen: Soft nontender nondistended, no ascites Extremities: Symmetrical in appearance without cyanosis, clubbing or effusion  Scheduled Meds:  Scheduled Meds: . antiseptic oral rinse  7 mL Mouth Rinse BID  . aspirin  81 mg Oral Daily  . B-complex with vitamin C  1 tablet Oral Daily  . budesonide (PULMICORT) nebulizer solution  0.25 mg Nebulization BID  . enoxaparin (LOVENOX) injection  40 mg Subcutaneous Q24H  . folic acid  1 mg Oral Daily  . gabapentin  300 mg Oral Daily  . ipratropium-albuterol  3 mL Nebulization BID  . lactulose  10 g Oral BID  . levETIRAcetam  500 mg Oral BID  . levofloxacin (LEVAQUIN) IV  500 mg Intravenous Q24H  . lisinopril  10 mg Oral Daily  . metoprolol succinate  50 mg Oral BID  . multivitamin with minerals  1 tablet Oral Daily  . nicotine  21 mg Transdermal Daily  . phenytoin  100 mg Oral BID  . predniSONE  20 mg Oral BID WC  . sodium chloride  3 mL Intravenous Q12H  . thiamine  100 mg Oral Daily  . Vitamin D (Ergocalciferol)  50,000 Units Oral Q7 days   Data Reviewed: Basic Metabolic Panel:  Recent Labs Lab 01/10/14 0208 01/10/14 1048 01/11/14 0254 01/12/14 0220 01/13/14 0300    NA 140 139 139 138 140  K 4.5 4.1 5.1 5.1 5.2  CL 100 100 103 99 100  CO2 26 26 27 27 30   GLUCOSE 121* 134* 128* 116* 129*  BUN 7 8 14 13 12   CREATININE 0.48* 0.49* 0.42* 0.42* 0.50  CALCIUM 9.2 9.0 8.4 8.8 8.7   Liver Function Tests:  Recent Labs Lab 01/10/14 1048 01/11/14 0254 01/12/14 0220  AST 68* 54* 52*  ALT 77* 62* 63*  ALKPHOS 185* 169* 170*  BILITOT 0.4 0.2* <0.2*  PROT 7.0 6.4 6.6  ALBUMIN 3.0* 2.8* 2.9*   CBC:  Recent Labs Lab 01/10/14 0208 01/10/14 1048 01/11/14 0254 01/12/14 0220 01/13/14 0300  WBC 13.3* 13.9* 14.1* 12.9* 7.0  NEUTROABS 10.4* 12.5*  --   --   --   HGB 16.2* 13.5 12.6 13.9 14.0  HCT 46.2* 40.7 37.5 40.4 42.1  MCV 98.5 101.8* 98.2 102.0* 99.3  PLT 113* 115* 123* 130* 141*   Cardiac Enzymes:  Recent Labs Lab 01/10/14 1048  TROPONINI <0.30    Recent Results (from the past 240 hour(s))  CULTURE, BLOOD (ROUTINE X 2)     Status: None   Collection Time    01/10/14  2:08 AM      Result Value Ref Range Status   Specimen Description BLOOD RIGHT UPPER ARM   Final   Special Requests BOTTLES DRAWN AEROBIC AND ANAEROBIC 5CC EACH   Final   Culture  Setup Time  Final   Value: 01/10/2014 08:02     Performed at [facility]   Culture     Final   Value:        BLOOD CULTURE RECEIVED NO GROWTH TO DATE CULTURE WILL BE HELD FOR 5 DAYS BEFORE ISSUING A FINAL NEGATIVE REPORT     Performed at [facility]   Report Status PENDING   Incomplete  CULTURE, BLOOD (ROUTINE X 2)     Status: None   Collection Time    01/10/14  2:22 AM      Result Value Ref Range Status   Specimen Description BLOOD RIGHT HAND   Final   Special Requests BOTTLES DRAWN AEROBIC ONLY 3CC   Final   Culture  Setup Time     Final   Value: 01/10/2014 08:03     Performed at [facility]   Culture     Final   Value:        BLOOD CULTURE RECEIVED NO GROWTH TO DATE CULTURE WILL BE HELD FOR 5 DAYS BEFORE ISSUING A FINAL NEGATIVE REPORT     Performed at  [facility]   Report Status PENDING   Incomplete  MRSA PCR SCREENING     Status: None   Collection Time    01/10/14  6:08 AM      Result Value Ref Range Status   MRSA by PCR NEGATIVE  NEGATIVE Final   Comment:            The GeneXpert MRSA Assay (FDA     approved for NASAL specimens     only), is one component of a     comprehensive MRSA colonization     surveillance program. It is not     intended to diagnose MRSA     infection nor to guide or     monitor treatment for     MRSA infections.     Studies:  Recent x-ray studies have been reviewed in detail by the Attending Physician  Time spent :  [place], ANP [facility] Office  [phone] Pager [phone]  On-Call/Text Page:      [email or web address]      password [email or web address]  If 7PM-7AM, please contact night-coverage [email or web address] Password [email or web address] 01/13/2014, 12:46 PM   LOS: 3 days  Examined patient and discussed assessment and plan with ANP Ebony Hail, agree with above plan Patient with multiple complex medical problems> 40 minutes spent in direct patient care

## 2014-01-13 NOTE — Evaluation (Signed)
Physical Therapy Evaluation Patient Details Name: Wanda Prince MRN: [id number] DOB: 02/08/1956 Today's Date: 01/13/2014   History of Present Illness  Patient is a 58 y/o female with history of COPD, ongoing tobacco abuse, previous history of intracranial bleed status post VP shunt placement and nonischemic cardiomyopathy presents to the ER because of worsening SOB. Acute respiratory failure with hypercapnia.    Clinical Impression  Patient presents with functional limitations due to deficits listed in PT problem list (see below). Pt with balance deficits and impaired cardiovascular endurance limiting safe mobility. Dyspnea present during gait requiring rest breaks with Sa02 decreasing to 88%. Resolved quickly. Discussion with pt and family about discharge plan. Pt may be able to stay with mother, who lives a few blocks away, as mother is bed bound and has 24/7 caregiver for supervision. Pt would benefit from acute PT to improve gait, balance and overall mobility so pt can maximize independence, minimize fall risk and return to PLOF.    Follow Up Recommendations Home health PT;Supervision for mobility/OOB    Equipment Recommendations  None recommended by PT    Recommendations for Other Services OT consult     Precautions / Restrictions Precautions Precautions: Fall Restrictions Weight Bearing Restrictions: No      Mobility  Bed Mobility Overal bed mobility: Modified Independent                Transfers Overall transfer level: Needs assistance Equipment used: Straight cane Transfers: Sit to/from Stand Sit to Stand: Supervision         General transfer comment: Supervision for safety.  Ambulation/Gait Ambulation/Gait assistance: Min guard Ambulation Distance (Feet): 150 Feet Assistive device: Straight cane Gait Pattern/deviations: Step-through pattern;Decreased stride length Gait velocity: decreased   General Gait Details: Pt with unsteady gait - balance improved  with increased distance. Dysnpea 2/4 present. Sa02 89-94% during gait. VC for pursed lip breathing.   Stairs            Wheelchair Mobility    Modified Rankin (Stroke Patients Only)       Balance Overall balance assessment: Needs assistance   Sitting balance-Leahy Scale: Good     Standing balance support: During functional activity Standing balance-Leahy Scale: Poor Standing balance comment: Requires use of UE support of SPC for balance/safety.                 Standardized Balance Assessment Standardized Balance Assessment : TUG: Timed Up and Go Test     Timed Up and Go Test TUG: Normal TUG Normal TUG (seconds): 13.7     Pertinent Vitals/Pain Pain Assessment: No/denies pain    Home Living Family/patient expects to be discharged to:: Private residence Living Arrangements: Alone Available Help at Discharge: Family;Available PRN/intermittently Type of Home: Apartment Home Access: Stairs to enter Entrance Stairs-Rails: Right Entrance Stairs-Number of Steps: 4 Home Layout: One level Home Equipment: Walker - 2 wheels;Cane - single point      Prior Function Level of Independence: Independent with assistive device(s)         Comments: (I) with ADLs, IADLs and uses SPC for ambulation. No falls reported.      Hand Dominance        Extremity/Trunk Assessment   Upper Extremity Assessment: Defer to OT evaluation           Lower Extremity Assessment: Generalized weakness         Communication   Communication: No difficulties  Cognition Arousal/Alertness: Awake/alert Behavior During Therapy: WFL for tasks assessed/performed Overall  Cognitive Status: Within Functional Limits for tasks assessed       Memory: Decreased short-term memory (per baseline)              General Comments General comments (skin integrity, edema, etc.): Pt with multiple bruises throughout BUEs.    Exercises        Assessment/Plan    PT Assessment  Patient needs continued PT services  PT Diagnosis Difficulty walking;Generalized weakness   PT Problem List Cardiopulmonary status limiting activity;Decreased cognition;Decreased activity tolerance;Decreased balance;Decreased mobility;Decreased safety awareness  PT Treatment Interventions Gait training   PT Goals (Current goals can be found in the Care Plan section) Acute Rehab PT Goals Patient Stated Goal: to be able to go home PT Goal Formulation: With patient Time For Goal Achievement: 01/27/14 Potential to Achieve Goals: Good    Frequency Min 3X/week   Barriers to discharge Decreased caregiver support Pt lives alone    Co-evaluation               End of Session Equipment Utilized During Treatment: Gait belt Activity Tolerance: Patient limited by fatigue Patient left: in bed;with call bell/phone within reach;with bed alarm set;with nursing/sitter in room;with family/visitor present Nurse Communication: Mobility status         Time: 9211-9417 PT Time Calculation (min): 20 min   Charges:   PT Evaluation $Initial PT Evaluation Tier I: 1 Procedure PT Treatments $Gait Training: 8-22 mins   PT G CodesCandy Sledge A 01/13/2014, 5:07 PM Candy Sledge, [place], DPT [phone]

## 2014-01-13 NOTE — Progress Notes (Signed)
NURSING PROGRESS NOTE  Wanda Prince [id number] Transfer Data: 01/13/2014 6:50 PM Attending Provider: Allie Bossier, MD LMR:[id number], Charolotte Eke Code Status: full  Wanda Prince is a 58 y.o. female patient transferred from [facility] -No acute distress noted.  -No complaints of shortness of breath.  -No complaints of chest pain.    Blood pressure 113/94, pulse 87, temperature 98.5 F (36.9 C), temperature source Oral, resp. rate 20, height 5\' 8"  (1.727 m), weight 52.6 kg (115 lb 15.4 oz), SpO2 92.00%.   IV Fluids:  IV in place, occlusive dsg intact without redness, IV cath foot right, condition patent and no redness .   Allergies:  Review of patient's allergies indicates no known allergies.  Past Medical History:   has a past medical history of Meningitis (2009); ventricular shunt (2009); Seizure disorder; Respiratory arrest; Abscess (April 2007); Suicide attempt; History of alcohol abuse; History of cocaine abuse; Major depressive disorder; History of iron deficiency; COPD (chronic obstructive pulmonary disease); History of acute alcoholic hepatitis; Psychiatric diagnosis; Arteriovenous malformation; and Left breast mass.  Past Surgical History:   has past surgical history that includes Craniotomy; breast mass removal; Brain surgery; and Breast surgery.  Skin: scattered bruising to BUE and bruise noted to right thigh.   Patient/Family orientated to room. Information packet given to patient/family. Admission inpatient armband information verified with patient/family to include name and date of birth and placed on patient arm. Side rails up x 2, fall assessment and education completed with patient/family. Patient/family able to verbalize understanding of risk associated with falls and verbalized understanding to call for assistance before getting out of bed. Call light within reach. Patient/family able to voice and demonstrate understanding of unit orientation instructions.    Will continue to  evaluate and treat per MD orders.  Wallie Renshaw, RN

## 2014-01-13 NOTE — Progress Notes (Signed)
Report received from [facility] in [facility] for patient to be transferred into 5w14

## 2014-01-13 NOTE — Progress Notes (Signed)
Report called  To [facility] on unit 5W. Patient to be transferred to Rm 5W14 via wheelchair.

## 2014-01-14 LAB — BASIC METABOLIC PANEL
Anion gap: 13 (ref 5–15)
BUN: 10 mg/dL (ref 6–23)
CO2: 27 mEq/L (ref 19–32)
Calcium: 9.1 mg/dL (ref 8.4–10.5)
Chloride: 98 mEq/L (ref 96–112)
Creatinine, Ser: 0.39 mg/dL — ABNORMAL LOW (ref 0.50–1.10)
GFR calc Af Amer: >90 mL/min (ref >90)
GFR calc non Af Amer: >90 mL/min (ref >90)
Glucose, Bld: 143 mg/dL — ABNORMAL HIGH (ref 70–99)
Potassium: 4.2 mEq/L (ref 3.7–5.3)
Sodium: 138 mEq/L (ref 137–147)

## 2014-01-14 LAB — CBC
HCT: 43.9 % (ref 36.0–46.0)
Hemoglobin: 14.5 g/dL (ref 12.0–15.0)
MCH: 33.4 pg (ref 26.0–34.0)
MCHC: 33 g/dL (ref 30.0–36.0)
MCV: 101.2 fL — ABNORMAL HIGH (ref 78.0–100.0)
Platelets: 157 10*3/uL (ref 150–400)
RBC: 4.34 MIL/uL (ref 3.87–5.11)
RDW: 12.9 % (ref 11.5–15.5)
WBC: 9.3 10*3/uL (ref 4.0–10.5)

## 2014-01-14 MED ORDER — LORAZEPAM 2 MG/ML IJ SOLN
INTRAMUSCULAR | Status: AC
  Filled 2014-01-14: qty 1

## 2014-01-14 MED ORDER — SODIUM CHLORIDE 0.9 % IV SOLN
INTRAVENOUS | Status: AC
  Administered 2014-01-14 – 2014-01-15 (×2): via INTRAVENOUS

## 2014-01-14 MED ORDER — PREDNISONE 10 MG PO TABS: ORAL_TABLET | ORAL | Status: DC

## 2014-01-14 MED ORDER — LORAZEPAM 2 MG/ML IJ SOLN
2.0000 mg | Freq: Once | INTRAMUSCULAR | Status: AC
  Administered 2014-01-14: 2 mg via INTRAVENOUS

## 2014-01-14 MED ORDER — LEVOFLOXACIN 500 MG PO TABS: 500.0000 mg | ORAL_TABLET | Freq: Every day | ORAL | Status: DC

## 2014-01-14 MED ORDER — PREDNISONE 1 MG PO TABS: ORAL_TABLET | ORAL | Status: DC

## 2014-01-14 NOTE — Progress Notes (Signed)
Pt continues to get out of the bed. She also called the operator and told her to call the cops on the nursing staff.

## 2014-01-14 NOTE — Care Management Note (Signed)
    Page 1 of 2   01/16/2014     9:54:14 AM CARE MANAGEMENT NOTE 01/16/2014  Patient:  Wanda Prince, Wanda Prince   Account Number:  [id number]  Date Initiated:  01/14/2014  Documentation initiated by:  Tomi Bamberger  Subjective/Objective Assessment:   dx copd, DT's- sitter  admit- from home.     Action/Plan:   pt eval- rec hhpt- patient has medicaid.   Anticipated DC Date:  01/16/2014   Anticipated DC Plan:  [place]  CM consult      [facility] Choice  HOME HEALTH   Choice offered to / List presented to:  C-1 Patient        [place] arranged  HH-1 RN  [place].   Status of service:  Completed, signed off Medicare Important Message given?  NO (If response is "NO", the following Medicare IM given date fields will be blank) Date Medicare IM given:   Medicare IM given by:   Date Additional Medicare IM given:   Additional Medicare IM given by:    Discharge Disposition:  [place]  Per UR Regulation:  Reviewed for med. necessity/level of care/duration of stay  If discussed at [place] of Stay Meetings, dates discussed:    Comments:   01-16-14 [place], RN,BSN [phone] Pt agreeable to [facility] services with [facility]. CM did relay to contact PCP for hospital f/u. No further needs at this time.   01/14/14 [place], BN [phone] patient is form home, conts to be in DT's, pt eval rec hhpt, patient has [employer] which will not cover unless patient has a cva or fx on this admit.  Paitent could get an [facility] if needed.

## 2014-01-14 NOTE — Progress Notes (Signed)
PT Cancellation Note  Patient Details Name: Wanda Prince MRN: [id number] DOB: 1956/01/29   Cancelled Treatment:    Reason Eval/Treat Not Completed: Other (comment).  Per RN hold PT at this time 2/2 am agitation.  Will f/u another time.     Adalynne Steffensmeier, Thornton Papas 01/14/2014, 11:18 AM

## 2014-01-14 NOTE — Progress Notes (Signed)
Wanda Prince ZOX:[id number] DOB: 25-May-1955 DOA: 01/10/2014 PCP: Saralyn Pilar  Brief narrative: 58 year old WF PMHx noncompliance, COPD and ongoing tobacco abuse. 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 She presented to the emergency department with complaints of shortness of breath intermittently for several days.   After arrival to the emergency department the patient was acutely short of breath and was placed on BiPAP. ABG was consistent with respiratory acidosis with a pH of 7.29 PCO2 of 62. She denied chest pain, productive cough, fever, chills, nausea, vomiting or abdominal pain or diarrhea. She reports noticing increased bruising of extremities. Renal function was normal, she did have mild transaminitis with a normal total bilirubin, showed mild leukocytosis 13,300, she also had mild thrombocytopenia with a platelet count of 113,000 which is at her normal baseline. Her chest x-ray showed COPD without evidence of focal infiltrate consistent with pneumonia.  On 9/15 patient's respiratory symptoms improved but unfortunately she developed symptoms of acute delirium that appear to be consistent with alcohol withdrawal. She had been started on a CIWA protocol at admission but it appears the frequency of Ativan dosing was not adequate therefore a step down Ativan CIWA protocol was initiated.  As of 9/17 patient appears to have gone through the worst for alcohol withdrawal. As well 9/18 overnight had agitation required IV ativen.  HPI/Subjective: Patient is sleepy as she received ativan overnight, as she was combative, and aggressive damaging computer screen with her cane..  Assessment/Plan:     Acute respiratory failure with hypoxia and hypercapnia/COPD with acute exacerbation Symptoms improved but not quite back to baseline-continue nebs-continue empiric Levaquin-on 9/17 Solu-Medrol transitioned to twice a day  prednisone-continue IS/flutter valve   Acute delirium/delerium tremens Suspect due to acute alcohol withdrawal but could also be exacerbated by steroids  - family reported pt on lactulose at home - ammonia initially mildly elevated but with hydration and resumption of lactulose now normal - continue with CIWA protocol, has a Actuary.  Previous tracheostomy Family concerned over minor serous drainage prior to admission-current site unremarkable with dressing in place .    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 Compensated at this time-last inpatient echocardiogram was in 2013 with an EF of 30-35% -ECHO this admit now shows preserved LV fnx and grade 1 DD  Constipation Lactulose low dose BID initiated 9/16      Coronary artery disease, non-occlusive Currently asymptomatic-see above regarding echocardiogram    Hypertension Continue Prinivil and Toprol     Tobacco use Counseled regarding cessation-nicotine patch    Seizure disorder Continue Dilantin and Keppra-Dilantin level therapeutic-Levetiracetam normal  DVT prophylaxis: Lovenox Code Status: Full Family Communication: No family at bedside Disposition Plan/Expected LOS: Home with Rn Vs SNF.  Consultants: None   Procedures: 2-D echocardiogram - LVEF=  60% to 65%. -(grade 1 diastolic dysfunction). Impressions:- Incomplete study. Patient refused to cooperate.  Antibiotics: Levaquin 9/13 >  Objective: Blood pressure 140/76, pulse 78, temperature 98.3 F (36.8 C), temperature source Oral, resp. rate 18, height 5\' 8"  (1.727 m), weight 52.6 kg (115 lb 15.4 oz), SpO2  100.00%.  Intake/Output Summary (Last 24 hours) at 01/14/14 1103 Last data filed at 01/13/14 2232  Gross  per 24 hour  Intake    480 ml  Output      0 ml  Net    480 ml    Exam: Gen: No acute respiratory distress -sleepy, but wakes up and communicatiev X2. Chest: Bilateral exp wheeze persists diffusely but not as course today - no focal crackles -remained stable on 2.5 L Cardiac: Regular rate and rhythm, S1-S2, no rubs murmurs or gallops, no peripheral edema Abdomen: Soft nontender nondistended, no ascites Extremities: Symmetrical in appearance without cyanosis, clubbing or effusion  Scheduled Meds:  Scheduled Meds: . antiseptic oral rinse  7 mL Mouth Rinse BID  . aspirin  81 mg Oral Daily  . B-complex with vitamin C  1 tablet Oral Daily  . budesonide (PULMICORT) nebulizer solution  0.25 mg Nebulization BID  . enoxaparin (LOVENOX) injection  40 mg Subcutaneous Q24H  . folic acid  1 mg Oral Daily  . gabapentin  300 mg Oral Daily  . ipratropium-albuterol  3 mL Nebulization BID  . lactulose  10 g Oral BID  . levETIRAcetam  500 mg Oral BID  . levofloxacin  500 mg Oral Daily  . lisinopril  10 mg Oral Daily  . metoprolol succinate  50 mg Oral BID  . multivitamin with minerals  1 tablet Oral Daily  . nicotine  21 mg Transdermal Daily  . phenytoin  100 mg Oral QPM  . phenytoin  200 mg Oral q morning - 10a  . predniSONE  20 mg Oral BID WC  . sodium chloride  3 mL Intravenous Q12H  . thiamine  100 mg Oral Daily  . Vitamin D (Ergocalciferol)  50,000 Units Oral Q7 days   Data Reviewed: Basic Metabolic Panel:  Recent Labs Lab 01/10/14 1048 01/11/14 0254 01/12/14 0220 01/13/14 0300 01/14/14 0618  NA 139 139 138 140 138  K 4.1 5.1 5.1 5.2 4.2  CL 100 103 99 100 98  CO2 26 27 27 30 27   GLUCOSE 134* 128* 116* 129* 143*  BUN 8 14 13 12 10   CREATININE 0.49* 0.42* 0.42* 0.50 0.39*  CALCIUM 9.0 8.4 8.8 8.7 9.1   Liver Function Tests:  Recent Labs Lab 01/10/14 1048  01/11/14 0254 01/12/14 0220  AST 68* 54* 52*  ALT 77* 62* 63*  ALKPHOS 185* 169* 170*  BILITOT 0.4 0.2* <0.2*  PROT 7.0 6.4 6.6  ALBUMIN 3.0* 2.8* 2.9*   CBC:  Recent Labs Lab 01/10/14 0208 01/10/14 1048 01/11/14 0254 01/12/14 0220 01/13/14 0300 01/14/14 0618  WBC 13.3* 13.9* 14.1* 12.9* 7.0 9.3  NEUTROABS 10.4* 12.5*  --   --   --   --   HGB 16.2* 13.5 12.6 13.9 14.0 14.5  HCT 46.2* 40.7 37.5 40.4 42.1 43.9  MCV 98.5 101.8* 98.2 102.0* 99.3 101.2*  PLT 113* 115* 123* 130* 141* 157   Cardiac Enzymes:  Recent Labs Lab 01/10/14 1048  TROPONINI <0.30    Recent Results (from the past 240 hour(s))  CULTURE, BLOOD (ROUTINE X 2)     Status: None   Collection Time    01/10/14  2:08 AM      Result Value Ref Range Status   Specimen Description BLOOD RIGHT UPPER ARM   Final   Special Requests BOTTLES DRAWN AEROBIC AND ANAEROBIC [facility] EACH   Final   Culture  Setup Time     Final   Value: 01/10/2014 08:02     Performed at [facility]   Culture     Final  Value:        BLOOD CULTURE RECEIVED NO GROWTH TO DATE CULTURE WILL BE HELD FOR 5 DAYS BEFORE ISSUING A FINAL NEGATIVE REPORT     Performed at [facility]   Report Status PENDING   Incomplete  CULTURE, BLOOD (ROUTINE X 2)     Status: None   Collection Time    01/10/14  2:22 AM      Result Value Ref Range Status   Specimen Description BLOOD RIGHT HAND   Final   Special Requests BOTTLES DRAWN AEROBIC ONLY 3CC   Final   Culture  Setup Time     Final   Value: 01/10/2014 08:03     Performed at [facility]   Culture     Final   Value:        BLOOD CULTURE RECEIVED NO GROWTH TO DATE CULTURE WILL BE HELD FOR 5 DAYS BEFORE ISSUING A FINAL NEGATIVE REPORT     Performed at [facility]   Report Status PENDING   Incomplete  MRSA PCR SCREENING     Status: None   Collection Time    01/10/14  6:08 AM      Result Value Ref Range Status   MRSA by PCR NEGATIVE  NEGATIVE Final   Comment:             The GeneXpert MRSA Assay (FDA     approved for NASAL specimens     only), is one component of a     comprehensive MRSA colonization     surveillance program. It is not     intended to diagnose MRSA     infection nor to guide or     monitor treatment for     MRSA infections.     Studies:  Recent x-ray studies have been reviewed in detail by the Attending Physician  Time spent :  58 mins  Phillips Climes MD Pager [phone]  If 7PM-7AM, please contact night-coverage [email or web address] Password [email or web address] 01/14/2014, 11:03 AM

## 2014-01-14 NOTE — Progress Notes (Signed)
Pt trying to get out of bed multiple times. [place], RN at bedside. Pt grabbed her cane and started beating the window, tv, and computers. Then patient started to move towards staff with the cane. Security alert activated. Security at bedside. On call provider notified. 2mg  of ativan given and patient placed in soft wrist and waist restraints.

## 2014-01-14 NOTE — Progress Notes (Signed)
Pt agitated and threatening to leave the hospital. Pt is also refusing the bed alarm. Pt comforted by RN and given 1mg  of ativan. Will continue to monitor.

## 2014-01-14 NOTE — Progress Notes (Signed)
OT Cancellation Note  Patient Details Name: Wanda Prince MRN: [id number] DOB: 06/27/1955   Cancelled Treatment:     Pt is agitated and combative today. She is currently restrained and sedated.   Zahi Plaskett 01/14/2014, 8:55 AM

## 2014-01-15 LAB — CBC
HCT: 38.1 % (ref 36.0–46.0)
Hemoglobin: 12.8 g/dL (ref 12.0–15.0)
MCH: 33.1 pg (ref 26.0–34.0)
MCHC: 33.6 g/dL (ref 30.0–36.0)
MCV: 98.4 fL (ref 78.0–100.0)
Platelets: 158 10*3/uL (ref 150–400)
RBC: 3.87 MIL/uL (ref 3.87–5.11)
RDW: 13 % (ref 11.5–15.5)
WBC: 7.9 10*3/uL (ref 4.0–10.5)

## 2014-01-15 LAB — BASIC METABOLIC PANEL
Anion gap: 10 (ref 5–15)
BUN: 9 mg/dL (ref 6–23)
CO2: 26 mEq/L (ref 19–32)
Calcium: 8.2 mg/dL — ABNORMAL LOW (ref 8.4–10.5)
Chloride: 104 mEq/L (ref 96–112)
Creatinine, Ser: 0.4 mg/dL — ABNORMAL LOW (ref 0.50–1.10)
GFR calc Af Amer: >90 mL/min (ref >90)
GFR calc non Af Amer: >90 mL/min (ref >90)
Glucose, Bld: 84 mg/dL (ref 70–99)
Potassium: 4.2 mEq/L (ref 3.7–5.3)
Sodium: 140 mEq/L (ref 137–147)

## 2014-01-15 LAB — MAGNESIUM: Magnesium: 1.8 mg/dL (ref 1.5–2.5)

## 2014-01-15 MED ORDER — PREDNISONE 10 MG PO TABS
10.0000 mg | ORAL_TABLET | Freq: Every day | ORAL | Status: DC
  Administered 2014-01-16: 10 mg via ORAL
  Filled 2014-01-15 (×2): qty 1

## 2014-01-15 MED ORDER — SODIUM CHLORIDE 0.9 % IV SOLN
INTRAVENOUS | Status: AC
  Administered 2014-01-15 (×2): via INTRAVENOUS

## 2014-01-15 NOTE — Progress Notes (Signed)
Wanda Prince WHQ:[id number] DOB: Apr 13, 1956 DOA: 01/10/2014 PCP: Saralyn Pilar  Brief narrative: 58 year old WF PMHx noncompliance, COPD and ongoing tobacco abuse. 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 She presented to the emergency department with complaints of shortness of breath intermittently for several days.   After arrival to the emergency department the patient was acutely short of breath and was placed on BiPAP. ABG was consistent with respiratory acidosis with a pH of 7.29 PCO2 of 62. She denied chest pain, productive cough, fever, chills, nausea, vomiting or abdominal pain or diarrhea. She reports noticing increased bruising of extremities. Renal function was normal, she did have mild transaminitis with a normal total bilirubin, showed mild leukocytosis 13,300, she also had mild thrombocytopenia with a platelet count of 113,000 which is at her normal baseline. Her chest x-ray showed COPD without evidence of focal infiltrate consistent with pneumonia.  On 9/15 patient's respiratory symptoms improved but unfortunately she developed symptoms of acute delirium that appear to be consistent with alcohol withdrawal. She had been started on a CIWA protocol at admission but it appears the frequency of Ativan dosing was not adequate therefore a step down Ativan CIWA protocol was initiated.  As of 9/17 patient appears to have gone through the worst for alcohol withdrawal. As well 9/18 overnight had agitation required IV ativen. The patient had significant improvement by 9/19 , where she is awake alert oriented x3, no further withdrawal symptoms.  HPI/Subjective: Patient is awake, eating breakfast at, pleasant has no complaints, denies any shortness of breath, chest pain, fever, chills.   Assessment/Plan:     Acute respiratory failure with hypoxia and hypercapnia/COPD with acute exacerbation Symptoms improved but not quite back  to baseline-continue nebs-continue empiric Levaquin-on 9/17 Solu-Medrol transitioned to once a day prednisone-continue IS/flutter valve   Acute delirium/delerium tremens Suspect due to acute alcohol withdrawal but could also be exacerbated by steroids  - family reported pt on lactulose at home - ammonia initially mildly elevated but with hydration and resumption of lactulose now normal - continue with CIWA protocol, has a Actuary. As a significant improvement of her mental status by 9/10, continue with thiamine and folic acid.  Previous tracheostomy Family concerned over minor serous drainage prior to admission-current site unremarkable with dressing in place .    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 Compensated at this time-last inpatient echocardiogram was in 2013 with an EF of 30-35% -ECHO this admit now shows preserved LV fnx and grade 1 DD  Constipation Lactulose low dose BID initiated 9/16      Coronary artery disease, non-occlusive Currently asymptomatic-see above regarding echocardiogram    Hypertension Continue Prinivil and Toprol     Tobacco use Counseled regarding cessation-nicotine patch    Seizure disorder Continue Dilantin and Keppra-Dilantin level therapeutic-Levetiracetam normal  DVT prophylaxis: Lovenox Code Status: Full Family Communication: Spoke with sister over the phone 9/19/ Disposition Plan/Expected LOS: Home with Rn in 24 hours.  Consultants: None   Procedures: 2-D echocardiogram - LVEF=  60% to 65%. -(grade 1 diastolic dysfunction). Impressions:- Incomplete study. Patient refused to  cooperate.  Antibiotics: Levaquin 9/13 >  Objective: Blood pressure 130/80, pulse 78, temperature 98.2 F (  36.8 C), temperature source Oral, resp. rate 18, height 5\' 8"  (1.727 m), weight 52.6 kg (115 lb 15.4 oz), SpO2 95.00%.  Intake/Output Summary (Last 24 hours) at 01/15/14 1335 Last data filed at 01/15/14 0900  Gross per 24 hour  Intake 2041.25 ml  Output      0 ml  Net 2041.25 ml    Exam: Gen: No acute respiratory distress -awake alert x3, pleasant. Chest: Bilateral exp wheeze persists diffusely but not as course today - no focal crackles -remained stable on 2.5 L Cardiac: Regular rate and rhythm, S1-S2, no rubs murmurs or gallops, no peripheral edema Abdomen: Soft nontender nondistended, no ascites Extremities: Symmetrical in appearance without cyanosis, clubbing or effusion  Scheduled Meds:  Scheduled Meds: . antiseptic oral rinse  7 mL Mouth Rinse BID  . aspirin  81 mg Oral Daily  . B-complex with vitamin C  1 tablet Oral Daily  . budesonide (PULMICORT) nebulizer solution  0.25 mg Nebulization BID  . enoxaparin (LOVENOX) injection  40 mg Subcutaneous Q24H  . folic acid  1 mg Oral Daily  . gabapentin  300 mg Oral Daily  . ipratropium-albuterol  3 mL Nebulization BID  . lactulose  10 g Oral BID  . levETIRAcetam  500 mg Oral BID  . levofloxacin  500 mg Oral Daily  . lisinopril  10 mg Oral Daily  . metoprolol succinate  50 mg Oral BID  . multivitamin with minerals  1 tablet Oral Daily  . nicotine  21 mg Transdermal Daily  . phenytoin  100 mg Oral QPM  . phenytoin  200 mg Oral q morning - 10a  . [START ON 01/16/2014] predniSONE  10 mg Oral Q breakfast  . sodium chloride  3 mL Intravenous Q12H  . thiamine  100 mg Oral Daily  . Vitamin D (Ergocalciferol)  50,000 Units Oral Q7 days   Data Reviewed: Basic Metabolic Panel:  Recent Labs Lab 01/11/14 0254 01/12/14 0220 01/13/14 0300 01/14/14 0618 01/15/14 0639  NA 139 138 140 138 140  K 5.1 5.1 5.2 4.2 4.2  CL  103 99 100 98 104  CO2 27 27 30 27 26   GLUCOSE 810* 175* 129* 143* 84  BUN 14 13 12 10 9   CREATININE 0.42* 0.42* 0.50 0.39* 0.40*  CALCIUM 8.4 8.8 8.7 9.1 8.2*  MG  --   --   --   --  1.8   Liver Function Tests:  Recent Labs Lab 01/10/14 1048 01/11/14 0254 01/12/14 0220  AST 68* 54* 52*  ALT 77* 62* 63*  ALKPHOS 185* 169* 170*  BILITOT 0.4 0.2* <0.2*  PROT 7.0 6.4 6.6  ALBUMIN 3.0* 2.8* 2.9*   CBC:  Recent Labs Lab 01/10/14 0208 01/10/14 1048 01/11/14 0254 01/12/14 0220 01/13/14 0300 01/14/14 0618 01/15/14 0639  WBC 13.3* 13.9* 14.1* 12.9* 7.0 9.3 7.9  NEUTROABS 10.4* 12.5*  --   --   --   --   --   HGB 16.2* 13.5 12.6 13.9 14.0 14.5 12.8  HCT 46.2* 40.7 37.5 40.4 42.1 43.9 38.1  MCV 98.5 101.8* 98.2 102.0* 99.3 101.2* 98.4  PLT 113* 115* 123* 130* 141* 157 158   Cardiac Enzymes:  Recent Labs Lab 01/10/14 1048  TROPONINI <0.30    Recent Results (from the past 240 hour(s))  CULTURE, BLOOD (ROUTINE X 2)     Status: None   Collection Time    01/10/14  2:08 AM      Result Value Ref Range Status  Specimen Description BLOOD RIGHT UPPER ARM   Final   Special Requests BOTTLES DRAWN AEROBIC AND ANAEROBIC [facility] EACH   Final   Culture  Setup Time     Final   Value: 01/10/2014 08:02     Performed at [facility]   Culture     Final   Value:        BLOOD CULTURE RECEIVED NO GROWTH TO DATE CULTURE WILL BE HELD FOR 5 DAYS BEFORE ISSUING A FINAL NEGATIVE REPORT     Performed at [facility]   Report Status PENDING   Incomplete  CULTURE, BLOOD (ROUTINE X 2)     Status: None   Collection Time    01/10/14  2:22 AM      Result Value Ref Range Status   Specimen Description BLOOD RIGHT HAND   Final   Special Requests BOTTLES DRAWN AEROBIC ONLY 3CC   Final   Culture  Setup Time     Final   Value: 01/10/2014 08:03     Performed at [facility]   Culture     Final   Value:        BLOOD CULTURE RECEIVED NO GROWTH TO DATE CULTURE WILL BE HELD FOR 5  DAYS BEFORE ISSUING A FINAL NEGATIVE REPORT     Performed at [facility]   Report Status PENDING   Incomplete  MRSA PCR SCREENING     Status: None   Collection Time    01/10/14  6:08 AM      Result Value Ref Range Status   MRSA by PCR NEGATIVE  NEGATIVE Final   Comment:            The GeneXpert MRSA Assay (FDA     approved for NASAL specimens     only), is one component of a     comprehensive MRSA colonization     surveillance program. It is not     intended to diagnose MRSA     infection nor to guide or     monitor treatment for     MRSA infections.     Studies:  Recent x-ray studies have been reviewed in detail by the Attending Physician  Time spent :  93 mins  Phillips Climes MD Pager [phone]  If 7PM-7AM, please contact night-coverage [email or web address] Password [email or web address] 01/15/2014, 1:35 PM

## 2014-01-16 LAB — CULTURE, BLOOD (ROUTINE X 2)
Culture: NO GROWTH
Culture: NO GROWTH

## 2014-01-16 MED ORDER — METHYLPREDNISOLONE (PAK) 4 MG PO TABS: ORAL_TABLET | ORAL | Status: DC

## 2014-01-16 MED ORDER — LACTULOSE 10 GM/15ML PO SOLN: 10.0000 g | Freq: Two times a day (BID) | ORAL | Status: DC

## 2014-01-16 NOTE — Progress Notes (Signed)
01/16/14 Patient going home today, IV site removed, discharge instructions reviewed with patient.

## 2014-01-16 NOTE — Discharge Summary (Addendum)
Wanda Prince, 58 y.o., DOB [birth year], MRN [id number]. Admission date: 01/10/2014 Discharge Date 01/16/2014 Primary MD Powers, Charolotte Eke Admitting Physician Rise Patience, MD  Admission Diagnosis  Respiratory distress [786.09] Seizure disorder [345.90] COPD exacerbation [491.21] Non-ischemic cardiomyopathy [425.4] Acute on chronic respiratory failure with hypercapnia [518.84]  Discharge Diagnosis   Active Problems:   Tobacco user   Alcohol abuse   Coronary artery disease, non-occlusive   Seizure disorder   Chronic combined systolic and diastolic heart failure, NYHA class 3   COPD with acute exacerbation   Acute respiratory failure with hypoxia and hypercapnia   Hypertension      Past Medical History  Diagnosis Date  . Meningitis 2009  . Hx of ventricular shunt 2009  . Seizure disorder   . 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 Suicide attempt     History of suicide attempts August 2006 and July 2006  . History of alcohol abuse   . History of cocaine abuse   . Major depressive disorder     History of  . History of iron deficiency   . COPD (chronic obstructive pulmonary disease)   . History of acute alcoholic hepatitis   . Psychiatric diagnosis     Multiple  . 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 Left breast mass     Past Surgical History  Procedure Laterality Date  . Craniotomy    . Breast mass removal    . Brain surgery    . Breast surgery     Brief narrative:  58 year old WF PMHx noncompliance, COPD and ongoing tobacco abuse. 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 She presented to the emergency department with complaints of shortness of breath intermittently for several days.  After arrival to the emergency department the patient was  acutely short of breath and was placed on BiPAP. ABG was consistent with respiratory acidosis with a pH of 7.29 PCO2 of 62. She denied chest pain, productive cough, fever, chills, nausea, vomiting or abdominal pain or diarrhea. She reports noticing increased bruising of extremities. Renal function was normal, she did have mild transaminitis with a normal total bilirubin, showed mild leukocytosis 13,300, she also had mild thrombocytopenia with a platelet count of 113,000 which is at her normal baseline. Her chest x-ray showed COPD without evidence of focal infiltrate consistent with pneumonia.  On 9/15 patient's respiratory symptoms improved but unfortunately she developed symptoms of acute delirium that appear to be consistent with alcohol withdrawal. She had been started on a CIWA protocol at admission but it appears the frequency of Ativan dosing was not adequate therefore a step down Ativan CIWA protocol was initiated.  As of 9/17 patient appears to have gone through the worst for alcohol withdrawal. As well 9/18 overnight had agitation required IV ativen. The patient had significant improvement by 9/19 , where she is awake alert oriented x3, no further withdrawal symptoms.      Hospital Course See H&P, Labs, Consult and Test reports for all details in brief.  Active Problems:   Tobacco user   Alcohol abuse   Coronary artery disease, non-occlusive   Seizure disorder   Chronic combined systolic and diastolic heart failure, NYHA class 3   COPD with acute exacerbation   Acute respiratory failure with hypoxia and hypercapnia  Hypertension  Acute respiratory failure with hypoxia and hypercapnia/COPD with acute exacerbation  Patient was on IV steroids, which currently tapered down to prednisone 20 mg oral daily, he was on levofloxacin for total of 4 days, currently has no active wheezing, 95% on room air, be discharged on Medrol taper pack. And her home nebulizers and inhalers.  Acute  delirium/delerium tremens  Suspect due to acute alcohol withdrawal but could also be exacerbated by steroids - family reported pt on lactulose at home - ammonia initially mildly elevated but with hydration and resumption of lactulose now normal - was on CIWA protocol, and required  a sitter. Had significant episodes of agitation and delirium on the ninth of 9/17, 9/18, As a significant improvement of her mental status by 9/19, back to baseline, no further delirium or DT. continue with thiamine and folic acid.  Previous tracheostomy  Family concerned over minor serous drainage prior to admission-current site unremarkable with dressing in place .  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 underlying cirrhosis  Acute delirium and some hallucinatory-type behavior on 9/15 -9/18 due to acute alcohol withdrawal/sx's have improved . ammonia level normal as of 9/17 /continue lactulose  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  Compensated at this time-last inpatient echocardiogram was in 2013 with an EF of 30-35% -ECHO this admit now shows preserved LV fnx and grade 1 DD  Constipation  Lactulose low dose BID initiated 9/16  Coronary artery disease, non-occlusive  Currently asymptomatic-see above regarding echocardiogram  Hypertension  Continue Prinivil and Toprol  Tobacco use  Counseled regarding cessation-nicotine patch  Seizure disorder  Continue Dilantin and Keppra-Dilantin level therapeutic-Levetiracetam normal    Code Status: Full  Family Communication: Spoke with sister over the phone 9/19. Disposition Plan: Home with Rn and PT.     Significant Tests:  See full reports for all details    Dg Chest Portable 1 View  01/10/2014   CLINICAL DATA:  Shortness of breath.  EXAM: PORTABLE CHEST - 1 VIEW  COMPARISON:  Chest radiograph performed 09/29/2012  FINDINGS: The lungs are hyperexpanded, with flattening of the hemidiaphragms,  compatible with COPD.  No focal consolidation, pleural effusion or pneumothorax is seen. The cardiomediastinal silhouette is normal in size.  A ventriculoperitoneal shunt is partially imaged overlying the right hemithorax. No acute osseous abnormalities are seen.  IMPRESSION: Findings of COPD.  No acute cardiopulmonary process seen.   Electronically Signed   By: Garald Balding M.D.   On: 01/10/2014 02:49   Mm Digital Screening Bilateral  01/07/2014   CLINICAL DATA:  Screening.  EXAM: DIGITAL SCREENING BILATERAL MAMMOGRAM WITH CAD  COMPARISON:  Previous exam(s).  ACR Breast Density Category c: The breast tissue is heterogeneously dense, which may obscure small masses.  FINDINGS: There are no findings suspicious for malignancy. Images were processed with CAD.  IMPRESSION: No mammographic evidence of malignancy. A result letter of this screening mammogram will be mailed directly to the patient.  RECOMMENDATION: Screening mammogram in one year. (Code:SM-B-01Y)  BI-RADS CATEGORY  1: Negative.   Electronically Signed   By: Everlean Alstrom M.D.   On: 01/07/2014 10:04     Today   Subjective:   Wanda Prince today has no headache,no chest abdominal pain,no new weakness tingling or numbness, feels much better wants to go home today.   Objective:   Blood pressure 128/79, pulse 72, temperature 97.5 F (36.4 C), temperature source Oral, resp. rate 18, height 5\' 8"  (1.727 m), weight 52.6  kg (115 lb 15.4 oz), SpO2 95.00%.  Intake/Output Summary (Last 24 hours) at 01/16/14 0956 Last data filed at 01/15/14 2200  Gross per 24 hour  Intake 1071.25 ml  Output      0 ml  Net 1071.25 ml    Exam Gen: No acute respiratory distress -awake alert x3, pleasant.  Chest: Good air entry bilaterally, no wheezing - no focal crackles -remained stable on room air , no discharge from previous healed tracheostomy site. Cardiac: Regular rate and rhythm, S1-S2, no rubs murmurs or gallops, no peripheral edema  Abdomen: Soft  nontender nondistended, no ascites  Extremities: Symmetrical in appearance without cyanosis, clubbing or effusion   Data Review     CBC w Diff: Lab Results  Component Value Date   WBC 7.9 01/15/2014   HGB 12.8 01/15/2014   HCT 38.1 01/15/2014   PLT 158 01/15/2014   LYMPHOPCT 6* 01/10/2014   MONOPCT 4 01/10/2014   EOSPCT 0 01/10/2014   BASOPCT 0 01/10/2014   CMP: Lab Results  Component Value Date   NA 140 01/15/2014   K 4.2 01/15/2014   CL 104 01/15/2014   CO2 26 01/15/2014   BUN 9 01/15/2014   CREATININE 0.40* 01/15/2014   PROT 6.6 01/12/2014   ALBUMIN 2.9* 01/12/2014   BILITOT <0.2* 01/12/2014   ALKPHOS 170* 01/12/2014   AST 52* 01/12/2014   ALT 63* 01/12/2014  .  Micro Results Recent Results (from the past 240 hour(s))  CULTURE, BLOOD (ROUTINE X 2)     Status: None   Collection Time    01/10/14  2:08 AM      Result Value Ref Range Status   Specimen Description BLOOD RIGHT UPPER ARM   Final   Special Requests BOTTLES DRAWN AEROBIC AND ANAEROBIC 5CC EACH   Final   Culture  Setup Time     Final   Value: 01/10/2014 08:02     Performed at [facility]   Culture     Final   Value:        BLOOD CULTURE RECEIVED NO GROWTH TO DATE CULTURE WILL BE HELD FOR 5 DAYS BEFORE ISSUING A FINAL NEGATIVE REPORT     Performed at [facility]   Report Status PENDING   Incomplete  CULTURE, BLOOD (ROUTINE X 2)     Status: None   Collection Time    01/10/14  2:22 AM      Result Value Ref Range Status   Specimen Description BLOOD RIGHT HAND   Final   Special Requests BOTTLES DRAWN AEROBIC ONLY 3CC   Final   Culture  Setup Time     Final   Value: 01/10/2014 08:03     Performed at [facility]   Culture     Final   Value:        BLOOD CULTURE RECEIVED NO GROWTH TO DATE CULTURE WILL BE HELD FOR 5 DAYS BEFORE ISSUING A FINAL NEGATIVE REPORT     Performed at [facility]   Report Status PENDING   Incomplete  MRSA PCR SCREENING     Status: None   Collection Time     01/10/14  6:08 AM      Result Value Ref Range Status   MRSA by PCR NEGATIVE  NEGATIVE Final   Comment:            The GeneXpert MRSA Assay (FDA     approved for NASAL specimens     only), is one  component of a     comprehensive MRSA colonization     surveillance program. It is not     intended to diagnose MRSA     infection nor to guide or     monitor treatment for     MRSA infections.           Follow-up Information   Schedule an appointment as soon as possible for a visit with Powers, Charolotte Eke.   Specialty:  Family Medicine   Contact information:   [place] [address] [phone]       Follow up with [place]. Occupational hygienist. )    Contact information:   [address] [place]  [address] [phone]       Discharge Medications     Medication List    STOP taking these medications       ibuprofen 200 MG tablet  Commonly known as:  ADVIL,MOTRIN      TAKE these medications       acetaminophen 500 MG tablet  Commonly known as:  TYLENOL  Take 1,000 mg by mouth every 6 (six) hours as needed for pain.     albuterol (2.5 MG/3ML) 0.083% nebulizer solution  Commonly known as:  PROVENTIL  Take 2.5 mg by nebulization every 6 (six) hours as needed for wheezing or shortness of breath.     albuterol 108 (90 BASE) MCG/ACT inhaler  Commonly known as:  PROVENTIL HFA;VENTOLIN HFA  Inhale 2 puffs into the lungs every 4 (four) hours as needed for wheezing or shortness of breath.     ALDACTONE 25 MG tablet  Generic drug:  spironolactone  Take 25 mg by mouth.     aspirin 81 MG chewable tablet  Chew 81 mg by mouth daily.     b complex vitamins tablet  Take 1 tablet by mouth 2 (two) times daily.     cyclobenzaprine 5 MG tablet  Commonly known as:  FLEXERIL  Take 5 mg by mouth every 8 (eight) hours as needed for muscle spasms.     diclofenac sodium 1 % Gel  Commonly known as:  VOLTAREN  Apply 4  g topically 4 (four) times daily.     Fluticasone-Salmeterol 250-50 MCG/DOSE Aepb  Commonly known as:  ADVAIR  Inhale 1 puff into the lungs every 12 (twelve) hours.     folic acid 1 MG tablet  Commonly known as:  FOLVITE  Take 1 mg by mouth daily.     gabapentin 300 MG capsule  Commonly known as:  NEURONTIN  Take 300 mg by mouth at bedtime.     lactulose 10 GM/15ML solution  Commonly known as:  CHRONULAC  Take 15 mLs (10 g total) by mouth 2 (two) times daily.     levETIRAcetam 500 MG tablet  Commonly known as:  KEPPRA  Take 1 tablet (500 mg total) by mouth 2 (two) times daily.     lisinopril 10 MG tablet  Commonly known as:  PRINIVIL,ZESTRIL  Take 10 mg by mouth daily.     methylPREDNIsolone 4 MG tablet  Commonly known as:  MEDROL DOSPACK  follow package directions     multivitamin with minerals Tabs tablet  Take 1 tablet by mouth daily.     phenytoin 100 MG ER capsule  Commonly known as:  DILANTIN  Take 100-200 mg by mouth 2 (two) times daily. 2 capsules (200 mg) in the am and 1 capsule (100 mg) in  the evening     PRESCRIPTION MEDICATION  Take by mouth 2 (two) times daily. For ammonia in liver     thiamine 100 MG tablet  Take 1 tablet (100 mg total) by mouth daily.     tiotropium 18 MCG inhalation capsule  Commonly known as:  SPIRIVA  Place 1 capsule (18 mcg total) into inhaler and inhale daily.     TOPROL XL 25 MG 24 hr tablet  Generic drug:  metoprolol succinate  Take 25 mg by mouth daily.     Vitamin D (Ergocalciferol) 50000 UNITS Caps capsule  Commonly known as:  DRISDOL  Take 50,000 Units by mouth every 7 (seven) days. On Mondays         Total Time in preparing paper work, data evaluation and todays exam - 88 minutes  Mertie Haslem M.D on 01/16/2014 at 9:56 AM  [facility] Office  [phone]

## 2014-01-16 NOTE — Discharge Instructions (Signed)
Follow with Primary MD Powers, Wanda Prince in 7 days   Get CBC, CMP, 2 view Chest X ray checked  by Primary MD next visit.    Activity: As tolerated with Full fall precautions use walker/cane & assistance as needed   Disposition Home ,   Diet: Heart Healthy  , with feeding assistance and aspiration precautions as needed.  For Heart failure patients - Check your Weight same time everyday, if you gain over 2 pounds, or you develop in leg swelling, experience more shortness of breath or chest pain, call your Primary MD immediately. Follow Cardiac Low Salt Diet and 1.8 lit/day fluid restriction.   On your next visit with her primary care physician please Get Medicines reviewed and adjusted.  Please request your Prim.MD to go over all Hospital Tests and Procedure/Radiological results at the follow up, please get all Hospital records sent to your Prim MD by signing hospital release before you go home.   If you experience worsening of your admission symptoms, develop shortness of breath, life threatening emergency, suicidal or homicidal thoughts you must seek medical attention immediately by calling 911 or calling your MD immediately  if symptoms less severe.  You Must read complete instructions/literature along with all the possible adverse reactions/side effects for all the Medicines you take and that have been prescribed to you. Take any new Medicines after you have completely understood and accpet all the possible adverse reactions/side effects.   Do not drive, operating heavy machinery, perform activities at heights, swimming or participation in water activities or provide baby sitting services if your were admitted for syncope or siezures until you have seen by Primary MD or a Neurologist and advised to do so again.  Do not drive when taking Pain medications.    Do not take more than prescribed Pain, Sleep and Anxiety Medications  Special Instructions: If you have smoked or chewed  Tobacco  in the last 2 yrs please stop smoking, stop any regular Alcohol  and or any Recreational drug use.  Wear Seat belts while driving.   Please note  You were cared for by a hospitalist during your hospital stay. If you have any questions about your discharge medications or the care you received while you were in the hospital after you are discharged, you can call the unit and asked to speak with the hospitalist on call if the hospitalist that took care of you is not available. Once you are discharged, your primary care physician will handle any further medical issues. Please note that NO REFILLS for any discharge medications will be authorized once you are discharged, as it is imperative that you return to your primary care physician (or establish a relationship with a primary care physician if you do not have one) for your aftercare needs so that they can reassess your need for medications and monitor your lab values.

## 2014-04-07 ENCOUNTER — Encounter (HOSPITAL_COMMUNITY): Payer: Self-pay | Admitting: Cardiovascular Disease

## 2014-08-29 ENCOUNTER — Other Ambulatory Visit (HOSPITAL_COMMUNITY): Payer: Self-pay | Admitting: Gastroenterology

## 2014-08-29 DIAGNOSIS — B1920 Unspecified viral hepatitis C without hepatic coma: Secondary | ICD-10-CM

## 2014-09-14 ENCOUNTER — Ambulatory Visit (HOSPITAL_COMMUNITY): Payer: Medicaid Other

## 2014-09-19 ENCOUNTER — Telehealth (HOSPITAL_COMMUNITY): Payer: Self-pay

## 2014-09-19 NOTE — Telephone Encounter (Signed)
Called to remind pt of appt on 09/20/14 at 7am..the pt agreed to stay npo for exam. AW

## 2014-09-20 ENCOUNTER — Ambulatory Visit (HOSPITAL_COMMUNITY)
Admission: RE | Admit: 2014-09-20 | Discharge: 2014-09-20 | Disposition: A | Discharge status: Home / Self Care | Payer: Medicaid Other | Source: Ambulatory Visit | Attending: Gastroenterology | Admitting: Gastroenterology

## 2014-09-20 DIAGNOSIS — K769 Liver disease, unspecified: Secondary | ICD-10-CM | POA: Insufficient documentation

## 2014-09-20 DIAGNOSIS — B1920 Unspecified viral hepatitis C without hepatic coma: Secondary | ICD-10-CM | POA: Diagnosis present

## 2014-09-27 ENCOUNTER — Other Ambulatory Visit: Payer: Self-pay | Admitting: Gastroenterology

## 2014-09-27 DIAGNOSIS — K7469 Other cirrhosis of liver: Secondary | ICD-10-CM

## 2014-10-06 IMAGING — CR DG CHEST 1V PORT
2 series · 2 of 2 positions shown · non-contrast
Comparison: Chest radiograph performed 09/29/2012

CLINICAL DATA: Shortness of breath.

EXAM:
PORTABLE CHEST - 1 VIEW

[AP (1 of 2)]
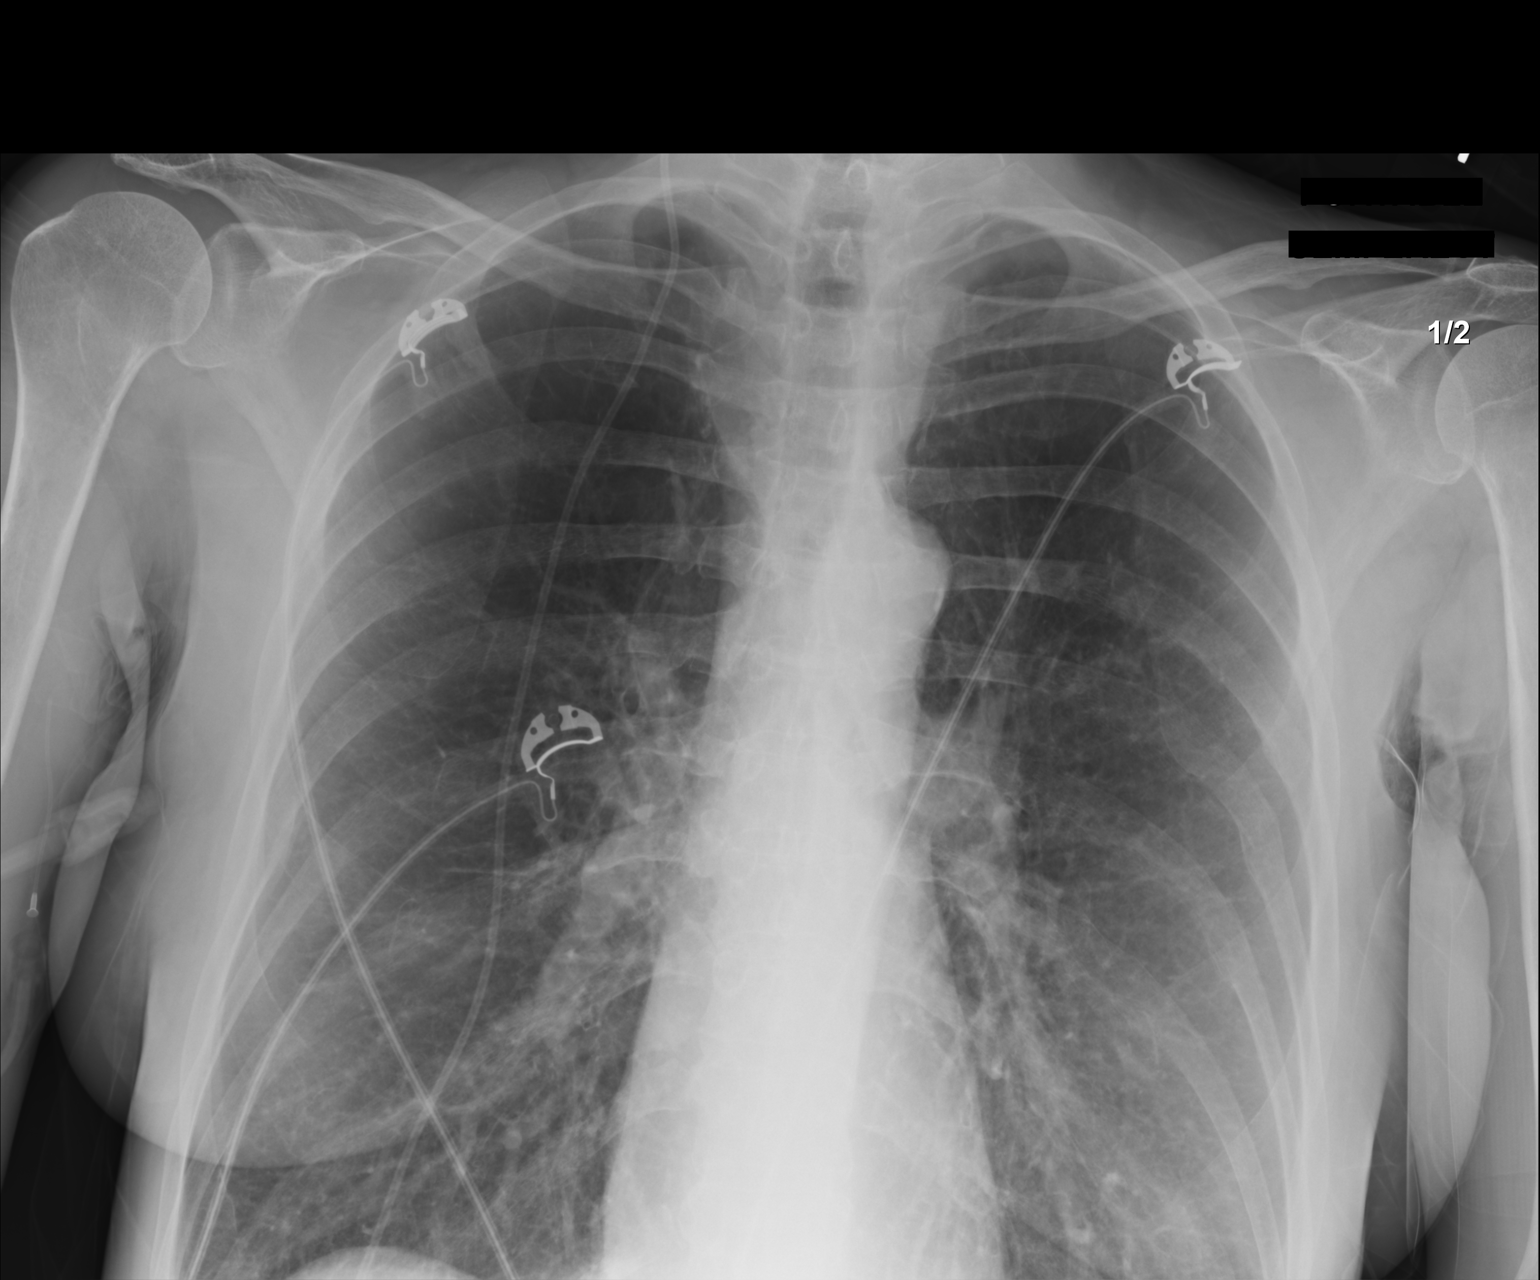

[AP (2 of 2)]
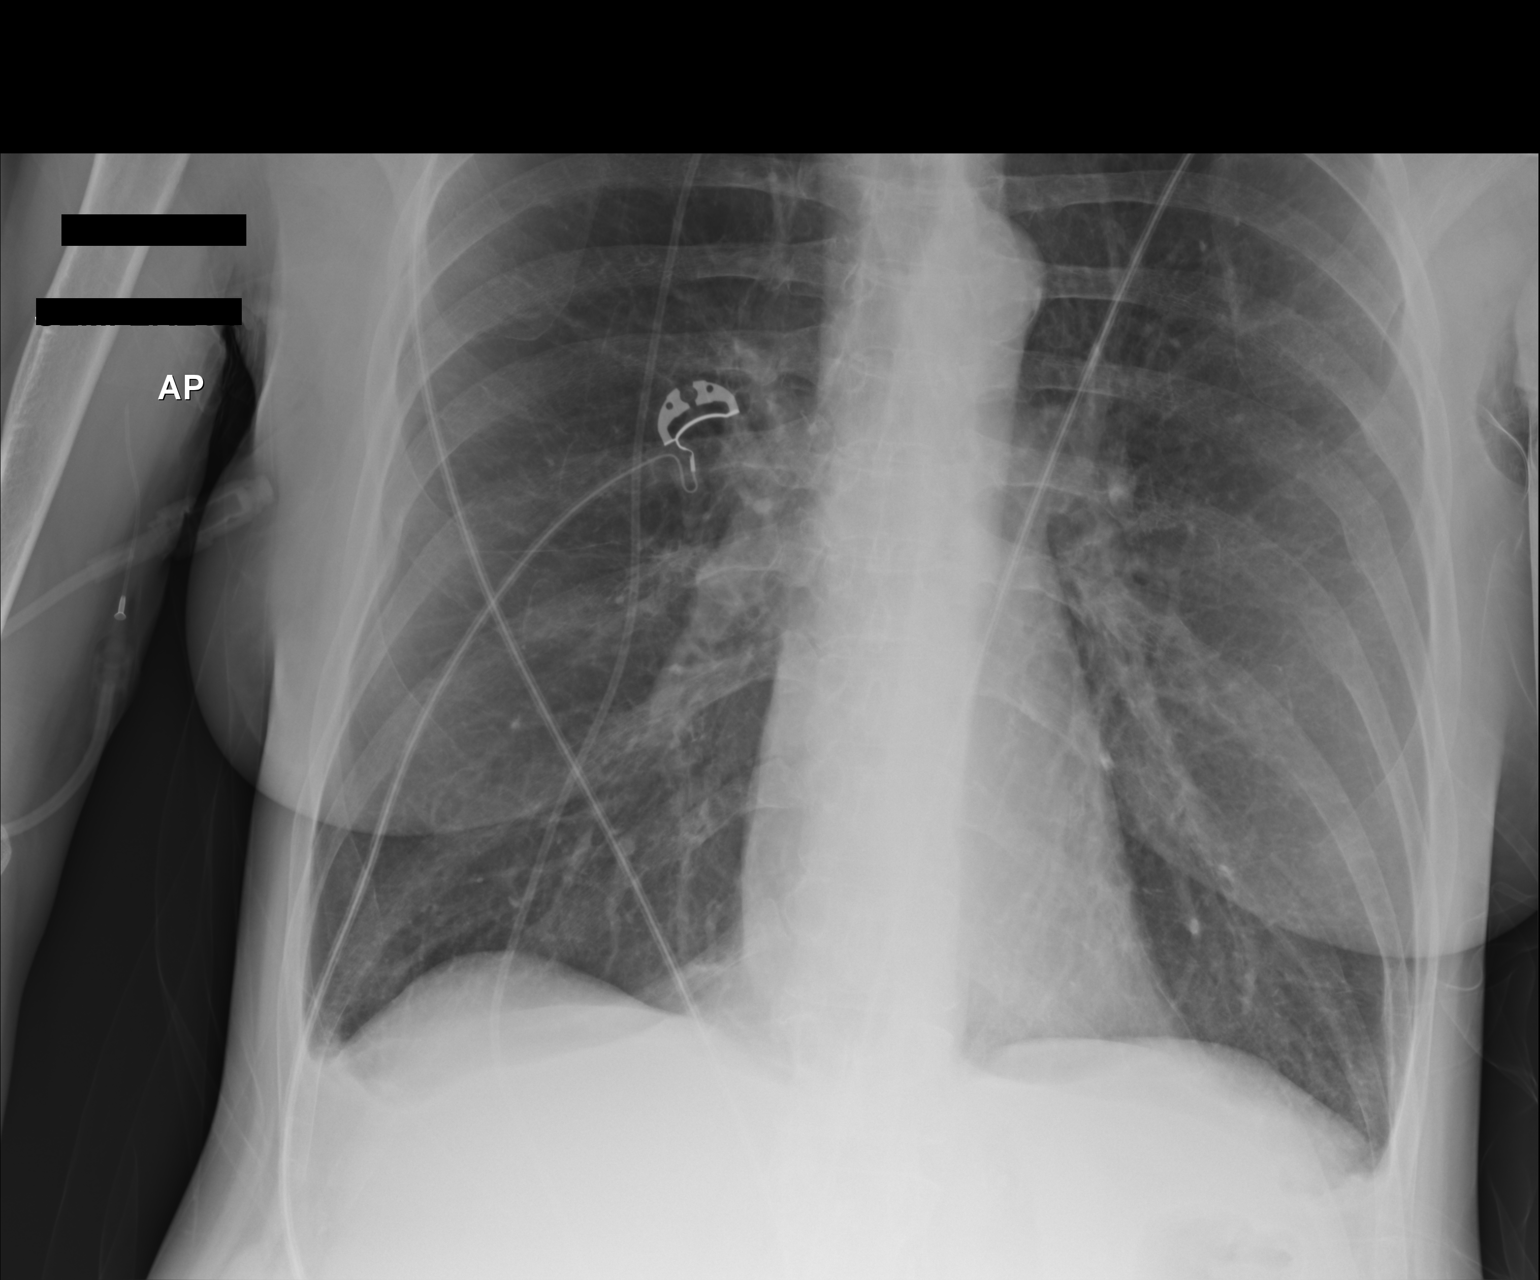

[2 of 2 positions shown; findings below may reference images not displayed]

FINDINGS: The lungs are hyperexpanded, with flattening of the hemidiaphragms,
compatible with COPD.

No focal consolidation, pleural effusion or pneumothorax is seen.
The cardiomediastinal silhouette is normal in size.

A ventriculoperitoneal shunt is partially imaged overlying the right
hemithorax. No acute osseous abnormalities are seen.
IMPRESSION: Findings of COPD.  No acute cardiopulmonary process seen.

## 2014-10-13 ENCOUNTER — Ambulatory Visit
Admission: RE | Admit: 2014-10-13 | Discharge: 2014-10-13 | Disposition: A | Discharge status: Home / Self Care | Payer: Medicaid Other | Source: Ambulatory Visit | Attending: Gastroenterology | Admitting: Gastroenterology

## 2014-10-13 DIAGNOSIS — K7469 Other cirrhosis of liver: Secondary | ICD-10-CM

## 2014-10-13 MED ORDER — GADOXETATE DISODIUM 0.25 MMOL/ML IV SOLN
5.0000 mL | Freq: Once | INTRAVENOUS | Status: AC | PRN
  Administered 2014-10-13: 5 mL via INTRAVENOUS

## 2015-01-04 ENCOUNTER — Other Ambulatory Visit: Payer: Self-pay

## 2015-01-04 DIAGNOSIS — Z1231 Encounter for screening mammogram for malignant neoplasm of breast: Secondary | ICD-10-CM

## 2015-01-17 ENCOUNTER — Ambulatory Visit
Admission: RE | Admit: 2015-01-17 | Discharge: 2015-01-17 | Disposition: A | Discharge status: Home / Self Care | Payer: Medicaid Other | Source: Ambulatory Visit

## 2015-01-17 DIAGNOSIS — Z1231 Encounter for screening mammogram for malignant neoplasm of breast: Secondary | ICD-10-CM

## 2015-02-08 ENCOUNTER — Other Ambulatory Visit: Payer: Self-pay | Admitting: Gastroenterology

## 2015-02-08 DIAGNOSIS — K7689 Other specified diseases of liver: Secondary | ICD-10-CM

## 2015-02-24 ENCOUNTER — Ambulatory Visit
Admission: RE | Admit: 2015-02-24 | Discharge: 2015-02-24 | Disposition: A | Discharge status: Home / Self Care | Payer: Medicaid Other | Source: Ambulatory Visit | Attending: Gastroenterology | Admitting: Gastroenterology

## 2015-02-24 DIAGNOSIS — K7689 Other specified diseases of liver: Secondary | ICD-10-CM

## 2015-02-24 MED ORDER — GADOXETATE DISODIUM 0.25 MMOL/ML IV SOLN
5.0000 mL | Freq: Once | INTRAVENOUS | Status: AC | PRN
  Administered 2015-02-24: 5 mL via INTRAVENOUS

## 2015-04-22 ENCOUNTER — Emergency Department (HOSPITAL_COMMUNITY): Payer: Medicaid Other

## 2015-04-22 ENCOUNTER — Observation Stay (HOSPITAL_COMMUNITY): Payer: Medicaid Other

## 2015-04-22 ENCOUNTER — Encounter (HOSPITAL_COMMUNITY): Payer: Self-pay

## 2015-04-22 ENCOUNTER — Inpatient Hospital Stay (HOSPITAL_COMMUNITY)
Admission: EM | Admit: 2015-04-22 | Discharge: 2015-05-07 | DRG: 208 | Disposition: A | Discharge status: Home / Self Care | Payer: Medicaid Other | Attending: Internal Medicine | Admitting: Internal Medicine

## 2015-04-22 DIAGNOSIS — R451 Restlessness and agitation: Secondary | ICD-10-CM | POA: Diagnosis not present

## 2015-04-22 DIAGNOSIS — Z7982 Long term (current) use of aspirin: Secondary | ICD-10-CM

## 2015-04-22 DIAGNOSIS — J9600 Acute respiratory failure, unspecified whether with hypoxia or hypercapnia: Secondary | ICD-10-CM

## 2015-04-22 DIAGNOSIS — R160 Hepatomegaly, not elsewhere classified: Secondary | ICD-10-CM | POA: Diagnosis present

## 2015-04-22 DIAGNOSIS — Z0189 Encounter for other specified special examinations: Secondary | ICD-10-CM

## 2015-04-22 DIAGNOSIS — I428 Other cardiomyopathies: Secondary | ICD-10-CM | POA: Diagnosis present

## 2015-04-22 DIAGNOSIS — R0602 Shortness of breath: Secondary | ICD-10-CM

## 2015-04-22 DIAGNOSIS — Z9289 Personal history of other medical treatment: Secondary | ICD-10-CM

## 2015-04-22 DIAGNOSIS — Z9889 Other specified postprocedural states: Secondary | ICD-10-CM

## 2015-04-22 DIAGNOSIS — J9601 Acute respiratory failure with hypoxia: Secondary | ICD-10-CM

## 2015-04-22 DIAGNOSIS — R918 Other nonspecific abnormal finding of lung field: Secondary | ICD-10-CM | POA: Diagnosis present

## 2015-04-22 DIAGNOSIS — G40909 Epilepsy, unspecified, not intractable, without status epilepticus: Secondary | ICD-10-CM | POA: Diagnosis present

## 2015-04-22 DIAGNOSIS — Z4659 Encounter for fitting and adjustment of other gastrointestinal appliance and device: Secondary | ICD-10-CM

## 2015-04-22 DIAGNOSIS — F17210 Nicotine dependence, cigarettes, uncomplicated: Secondary | ICD-10-CM | POA: Diagnosis present

## 2015-04-22 DIAGNOSIS — Z781 Physical restraint status: Secondary | ICD-10-CM

## 2015-04-22 DIAGNOSIS — J9690 Respiratory failure, unspecified, unspecified whether with hypoxia or hypercapnia: Secondary | ICD-10-CM

## 2015-04-22 DIAGNOSIS — Z915 Personal history of self-harm: Secondary | ICD-10-CM

## 2015-04-22 DIAGNOSIS — F1010 Alcohol abuse, uncomplicated: Secondary | ICD-10-CM | POA: Diagnosis present

## 2015-04-22 DIAGNOSIS — I10 Essential (primary) hypertension: Secondary | ICD-10-CM | POA: Diagnosis present

## 2015-04-22 DIAGNOSIS — Z978 Presence of other specified devices: Secondary | ICD-10-CM

## 2015-04-22 DIAGNOSIS — R109 Unspecified abdominal pain: Secondary | ICD-10-CM

## 2015-04-22 DIAGNOSIS — J984 Other disorders of lung: Secondary | ICD-10-CM | POA: Insufficient documentation

## 2015-04-22 DIAGNOSIS — G629 Polyneuropathy, unspecified: Secondary | ICD-10-CM | POA: Diagnosis present

## 2015-04-22 DIAGNOSIS — G9340 Encephalopathy, unspecified: Secondary | ICD-10-CM | POA: Diagnosis not present

## 2015-04-22 DIAGNOSIS — J441 Chronic obstructive pulmonary disease with (acute) exacerbation: Principal | ICD-10-CM | POA: Diagnosis present

## 2015-04-22 DIAGNOSIS — Z7951 Long term (current) use of inhaled steroids: Secondary | ICD-10-CM

## 2015-04-22 DIAGNOSIS — Z8661 Personal history of infections of the central nervous system: Secondary | ICD-10-CM

## 2015-04-22 DIAGNOSIS — R64 Cachexia: Secondary | ICD-10-CM | POA: Diagnosis present

## 2015-04-22 DIAGNOSIS — Z801 Family history of malignant neoplasm of trachea, bronchus and lung: Secondary | ICD-10-CM

## 2015-04-22 DIAGNOSIS — Z681 Body mass index (BMI) 19 or less, adult: Secondary | ICD-10-CM

## 2015-04-22 DIAGNOSIS — R0600 Dyspnea, unspecified: Secondary | ICD-10-CM

## 2015-04-22 DIAGNOSIS — Z982 Presence of cerebrospinal fluid drainage device: Secondary | ICD-10-CM

## 2015-04-22 DIAGNOSIS — D696 Thrombocytopenia, unspecified: Secondary | ICD-10-CM | POA: Diagnosis present

## 2015-04-22 DIAGNOSIS — J9602 Acute respiratory failure with hypercapnia: Secondary | ICD-10-CM | POA: Diagnosis present

## 2015-04-22 HISTORY — DX: Other specified postprocedural states: Z98.890

## 2015-04-22 LAB — I-STAT CHEM 8, ED
BUN: 6 mg/dL (ref 6–20)
Calcium, Ion: 1.1 mmol/L — ABNORMAL LOW (ref 1.12–1.23)
Chloride: 103 mmol/L (ref 101–111)
Creatinine, Ser: 0.5 mg/dL (ref 0.44–1.00)
Glucose, Bld: 117 mg/dL — ABNORMAL HIGH (ref 65–99)
HCT: 50 % — ABNORMAL HIGH (ref 36.0–46.0)
Hemoglobin: 17 g/dL — ABNORMAL HIGH (ref 12.0–15.0)
Potassium: 3.8 mmol/L (ref 3.5–5.1)
Sodium: 140 mmol/L (ref 135–145)
TCO2: 27 mmol/L (ref 0–100)

## 2015-04-22 LAB — CBC WITH DIFFERENTIAL/PLATELET
Basophils Absolute: 0 10*3/uL (ref 0.0–0.1)
Basophils Relative: 0 %
Eosinophils Absolute: 0.1 10*3/uL (ref 0.0–0.7)
Eosinophils Relative: 1 %
HCT: 46.1 % — ABNORMAL HIGH (ref 36.0–46.0)
Hemoglobin: 15.2 g/dL — ABNORMAL HIGH (ref 12.0–15.0)
Lymphocytes Relative: 12 %
Lymphs Abs: 1 10*3/uL (ref 0.7–4.0)
MCH: 31.7 pg (ref 26.0–34.0)
MCHC: 33 g/dL (ref 30.0–36.0)
MCV: 96.2 fL (ref 78.0–100.0)
Monocytes Absolute: 0.3 10*3/uL (ref 0.1–1.0)
Monocytes Relative: 4 %
Neutro Abs: 7.1 10*3/uL (ref 1.7–7.7)
Neutrophils Relative %: 83 %
Platelets: 110 10*3/uL — ABNORMAL LOW (ref 150–400)
RBC: 4.79 MIL/uL (ref 3.87–5.11)
RDW: 13.3 % (ref 11.5–15.5)
WBC: 8.6 10*3/uL (ref 4.0–10.5)

## 2015-04-22 LAB — TSH: TSH: 0.278 u[IU]/mL — ABNORMAL LOW (ref 0.350–4.500)

## 2015-04-22 MED ORDER — GABAPENTIN 300 MG PO CAPS
300.0000 mg | ORAL_CAPSULE | Freq: Every day | ORAL | Status: DC
  Administered 2015-04-22 – 2015-05-06 (×14): 300 mg via ORAL
  Filled 2015-04-22 (×15): qty 1

## 2015-04-22 MED ORDER — B COMPLEX PO TABS
1.0000 | ORAL_TABLET | Freq: Two times a day (BID) | ORAL | Status: DC
  Administered 2015-04-23 – 2015-04-26 (×4): 1 via ORAL
  Filled 2015-04-22 (×10): qty 1

## 2015-04-22 MED ORDER — ADULT MULTIVITAMIN W/MINERALS CH
1.0000 | ORAL_TABLET | Freq: Every day | ORAL | Status: DC
  Administered 2015-04-22 – 2015-04-23 (×2): 1 via ORAL
  Filled 2015-04-22: qty 1

## 2015-04-22 MED ORDER — ALBUTEROL SULFATE (2.5 MG/3ML) 0.083% IN NEBU
2.5000 mg | INHALATION_SOLUTION | RESPIRATORY_TRACT | Status: DC | PRN
  Administered 2015-04-29 – 2015-05-04 (×3): 2.5 mg via RESPIRATORY_TRACT
  Filled 2015-04-22 (×4): qty 3

## 2015-04-22 MED ORDER — IPRATROPIUM-ALBUTEROL 0.5-2.5 (3) MG/3ML IN SOLN
RESPIRATORY_TRACT | Status: AC
  Administered 2015-04-22: 3 mL via RESPIRATORY_TRACT
  Filled 2015-04-22: qty 3

## 2015-04-22 MED ORDER — LORAZEPAM 1 MG PO TABS
1.0000 mg | ORAL_TABLET | Freq: Four times a day (QID) | ORAL | Status: DC | PRN
  Administered 2015-04-22: 1 mg via ORAL
  Filled 2015-04-22: qty 1

## 2015-04-22 MED ORDER — VITAMIN D 1000 UNITS PO TABS
1000.0000 [IU] | ORAL_TABLET | Freq: Every day | ORAL | Status: DC
  Administered 2015-04-22 – 2015-05-07 (×14): 1000 [IU] via ORAL
  Filled 2015-04-22 (×16): qty 1

## 2015-04-22 MED ORDER — HEPARIN SODIUM (PORCINE) 5000 UNIT/ML IJ SOLN
5000.0000 [IU] | Freq: Three times a day (TID) | INTRAMUSCULAR | Status: DC
  Administered 2015-04-22 – 2015-04-27 (×13): 5000 [IU] via SUBCUTANEOUS
  Filled 2015-04-22 (×18): qty 1

## 2015-04-22 MED ORDER — FOLIC ACID 1 MG PO TABS
1.0000 mg | ORAL_TABLET | Freq: Every day | ORAL | Status: DC
  Administered 2015-04-22 – 2015-04-23 (×2): 1 mg via ORAL
  Filled 2015-04-22: qty 1

## 2015-04-22 MED ORDER — THIAMINE HCL 100 MG/ML IJ SOLN: 100.0000 mg | Freq: Every day | INTRAMUSCULAR | Status: DC

## 2015-04-22 MED ORDER — LORAZEPAM 2 MG/ML IJ SOLN
1.0000 mg | Freq: Four times a day (QID) | INTRAMUSCULAR | Status: DC | PRN
  Administered 2015-04-23: 1 mg via INTRAVENOUS
  Filled 2015-04-22: qty 1

## 2015-04-22 MED ORDER — ALBUTEROL SULFATE (2.5 MG/3ML) 0.083% IN NEBU: 2.5000 mg | INHALATION_SOLUTION | Freq: Four times a day (QID) | RESPIRATORY_TRACT | Status: DC | PRN

## 2015-04-22 MED ORDER — ADULT MULTIVITAMIN W/MINERALS CH
1.0000 | ORAL_TABLET | Freq: Every day | ORAL | Status: DC
  Filled 2015-04-22: qty 1

## 2015-04-22 MED ORDER — FOLIC ACID 1 MG PO TABS
1.0000 mg | ORAL_TABLET | Freq: Every day | ORAL | Status: DC
  Filled 2015-04-22: qty 1

## 2015-04-22 MED ORDER — METHYLPREDNISOLONE SODIUM SUCC 125 MG IJ SOLR
INTRAMUSCULAR | Status: AC
  Filled 2015-04-22: qty 2

## 2015-04-22 MED ORDER — VITAMIN B-1 100 MG PO TABS
100.0000 mg | ORAL_TABLET | Freq: Every day | ORAL | Status: DC
  Administered 2015-04-22 – 2015-04-23 (×2): 100 mg via ORAL
  Filled 2015-04-22: qty 1

## 2015-04-22 MED ORDER — ASPIRIN 81 MG PO CHEW
81.0000 mg | CHEWABLE_TABLET | Freq: Every day | ORAL | Status: DC
  Administered 2015-04-23 – 2015-05-07 (×14): 81 mg via ORAL
  Filled 2015-04-22 (×14): qty 1

## 2015-04-22 MED ORDER — LEVETIRACETAM 500 MG PO TABS
500.0000 mg | ORAL_TABLET | Freq: Two times a day (BID) | ORAL | Status: DC
  Administered 2015-04-22 – 2015-04-23 (×3): 500 mg via ORAL
  Filled 2015-04-22 (×5): qty 1

## 2015-04-22 MED ORDER — THIAMINE HCL 100 MG PO TABS
100.0000 mg | ORAL_TABLET | Freq: Every day | ORAL | Status: DC
  Filled 2015-04-22: qty 1

## 2015-04-22 MED ORDER — METHYLPREDNISOLONE SODIUM SUCC 125 MG IJ SOLR
125.0000 mg | Freq: Once | INTRAMUSCULAR | Status: AC
  Administered 2015-04-22: 125 mg via INTRAVENOUS

## 2015-04-22 MED ORDER — METOPROLOL SUCCINATE ER 25 MG PO TB24
25.0000 mg | ORAL_TABLET | Freq: Every day | ORAL | Status: DC
  Administered 2015-04-22 – 2015-04-29 (×7): 25 mg via ORAL
  Filled 2015-04-22 (×8): qty 1

## 2015-04-22 MED ORDER — TIOTROPIUM BROMIDE MONOHYDRATE 18 MCG IN CAPS
18.0000 ug | ORAL_CAPSULE | Freq: Every day | RESPIRATORY_TRACT | Status: DC
  Filled 2015-04-22: qty 5

## 2015-04-22 MED ORDER — NICOTINE 21 MG/24HR TD PT24
21.0000 mg | MEDICATED_PATCH | Freq: Every day | TRANSDERMAL | Status: DC
  Administered 2015-04-22 – 2015-04-30 (×10): 21 mg via TRANSDERMAL
  Filled 2015-04-22 (×10): qty 1

## 2015-04-22 MED ORDER — NICOTINE 21 MG/24HR TD PT24: 21.0000 mg | MEDICATED_PATCH | Freq: Every day | TRANSDERMAL | Status: DC

## 2015-04-22 MED ORDER — CYCLOBENZAPRINE HCL 5 MG PO TABS
5.0000 mg | ORAL_TABLET | Freq: Three times a day (TID) | ORAL | Status: DC | PRN
  Administered 2015-04-22 – 2015-04-27 (×3): 5 mg via ORAL
  Filled 2015-04-22 (×3): qty 1

## 2015-04-22 MED ORDER — IPRATROPIUM-ALBUTEROL 0.5-2.5 (3) MG/3ML IN SOLN
3.0000 mL | RESPIRATORY_TRACT | Status: DC
  Administered 2015-04-22 – 2015-04-23 (×4): 3 mL via RESPIRATORY_TRACT
  Filled 2015-04-22 (×4): qty 3

## 2015-04-22 MED ORDER — PREDNISONE 20 MG PO TABS: 40.0000 mg | ORAL_TABLET | Freq: Every day | ORAL | Status: DC

## 2015-04-22 MED ORDER — ALBUTEROL (5 MG/ML) CONTINUOUS INHALATION SOLN
10.0000 mg | INHALATION_SOLUTION | Freq: Once | RESPIRATORY_TRACT | Status: AC
  Administered 2015-04-22: 10 mg via RESPIRATORY_TRACT
  Filled 2015-04-22: qty 20

## 2015-04-22 NOTE — ED Notes (Signed)
Pt walked in hallway and became very short of breath with O2 levels dropping to 88%.

## 2015-04-22 NOTE — ED Notes (Signed)
Attempted to call report

## 2015-04-22 NOTE — H&P (Signed)
Date: 04/22/2015               Patient Name:  Wanda Prince MRN: [id number]  DOB: 13-Apr-1956 Age / Sex: 59 y.o., female   PCP: Saralyn Pilar, MD         Medical Service: Internal Medicine Teaching Service         Attending Physician: Dr. Annia Belt, MD    First Contact: Dr. Charlynn Grimes Pager: [phone]  Second Contact: Dr. Hulen Luster Pager: [phone]       After Hours (After 5p/  First Contact Pager: [phone]  weekends / holidays): Second Contact Pager: [phone]   Chief Complaint: Shortness of breath  History of Present Illness: Wanda Prince is a 59 year old man with a past medical history of COPD, active tobacco abuse, history of ETOH and cocaine abuse presents with worsening shortness of breath. She reports that over the past day she has had worsening shortness of breath with a non-productive cough that has not responded to her home albuterol. She reports she uses Spiriva and Advair at home. Reports normally she uses her albuterol inhaler 2-3 times a day but over the past week she has been requiring it 4-5 times a day. She had some relief with the albuterol until yesterday evening when her shortness of breath continued to get worse and finally came to the ED this morning. She received albuterol and solumedrol in the ED with improvement in her symptoms but acutely worsened with O2 desaturation. She denies any fevers, chills, chest pain, nausea, vomiting or diarrhea. She is not on any home O2. Current everyday smoker, 1 PPD.   Meds: Current Facility-Administered Medications  Medication Dose Route Frequency Provider Last Rate Last Dose  . aspirin chewable tablet 81 mg  81 mg Oral Daily Norman Herrlich, MD      . b complex vitamins tablet 1 tablet  1 tablet Oral BID Norman Herrlich, MD      . cholecalciferol (VITAMIN D) tablet 1,000 Units  1,000 Units Oral Daily Norman Herrlich, MD      . folic acid (FOLVITE) tablet 1 mg  1 mg Oral Daily Norman Herrlich, MD      . gabapentin (NEURONTIN)  capsule 300 mg  300 mg Oral QHS Norman Herrlich, MD      . heparin injection 5,000 Units  5,000 Units Subcutaneous 3 times per day Norman Herrlich, MD      . levETIRAcetam (KEPPRA) tablet 500 mg  500 mg Oral BID Norman Herrlich, MD      . metoprolol succinate (TOPROL-XL) 24 hr tablet 25 mg  25 mg Oral Daily Norman Herrlich, MD      . multivitamin with minerals tablet 1 tablet  1 tablet Oral Daily Norman Herrlich, MD      . nicotine (NICODERM CQ - dosed in mg/24 hours) patch 21 mg  21 mg Transdermal Daily Orlie Dakin, MD   21 mg at 04/22/15 1732  . nicotine (NICODERM CQ - dosed in mg/24 hours) patch 21 mg  21 mg Transdermal Daily Norman Herrlich, MD      . thiamine tablet 100 mg  100 mg Oral Daily Norman Herrlich, MD       Current Outpatient Prescriptions  Medication Sig Dispense Refill  . acetaminophen (TYLENOL) 500 MG tablet Take 1,000 mg by mouth every 6 (six) hours as needed for pain.    Marland Kitchen albuterol (PROVENTIL HFA;VENTOLIN HFA) 108 (90 BASE)  MCG/ACT inhaler Inhale 2 puffs into the lungs every 4 (four) hours as needed for wheezing or shortness of breath.     Marland Kitchen albuterol (PROVENTIL) (2.5 MG/3ML) 0.083% nebulizer solution Take 2.5 mg by nebulization every 6 (six) hours as needed for wheezing or shortness of breath.    Marland Kitchen aspirin 81 MG chewable tablet Chew 81 mg by mouth daily.    Marland Kitchen b complex vitamins tablet Take 1 tablet by mouth 2 (two) times daily. 62 tablet 0  . cholecalciferol (VITAMIN D) 1000 UNITS tablet Take 1,000 Units by mouth daily.    . cyclobenzaprine (FLEXERIL) 5 MG tablet Take 5 mg by mouth every 8 (eight) hours as needed for muscle spasms.     . Fluticasone-Salmeterol (ADVAIR) 250-50 MCG/DOSE AEPB Inhale 1 puff into the lungs every 12 (twelve) hours. 60 each 6  . folic acid (FOLVITE) 1 MG tablet Take 1 mg by mouth daily.    Marland Kitchen gabapentin (NEURONTIN) 300 MG capsule Take 300 mg by mouth at bedtime.     . levETIRAcetam (KEPPRA) 500 MG tablet Take 1 tablet (500 mg total) by mouth 2 (two)  times daily. 62 tablet 0  . lisinopril (PRINIVIL,ZESTRIL) 10 MG tablet Take 10 mg by mouth daily.    . metoprolol succinate (TOPROL XL) 25 MG 24 hr tablet Take 25 mg by mouth daily.     . Multiple Vitamin (MULITIVITAMIN WITH MINERALS) TABS Take 1 tablet by mouth daily. 30 tablet 6  . spironolactone (ALDACTONE) 25 MG tablet Take 25 mg by mouth daily.     Marland Kitchen thiamine 100 MG tablet Take 1 tablet (100 mg total) by mouth daily. 30 tablet 6  . tiotropium (SPIRIVA) 18 MCG inhalation capsule Place 1 capsule (18 mcg total) into inhaler and inhale daily. 30 capsule 6  . Vitamin D, Ergocalciferol, (DRISDOL) 50000 UNITS CAPS Take 50,000 Units by mouth every 7 (seven) days. On Mondays    . lactulose (CHRONULAC) 10 GM/15ML solution Take 15 mLs (10 g total) by mouth 2 (two) times daily. (Patient not taking: Reported on 04/22/2015) 240 mL 0  . methylPREDNIsolone (MEDROL DOSPACK) 4 MG tablet follow package directions (Patient not taking: Reported on 04/22/2015) 21 tablet 0    Allergies: Allergies as of 04/22/2015  . (No Known Allergies)   Past Medical History  Diagnosis Date  . Meningitis 2009  . Hx of ventricular shunt 2009  . Seizure disorder ([place])   . 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 Suicide attempt [facility])     History of suicide attempts August 2006 and July 2006  . History of alcohol abuse   . History of cocaine abuse   . Major depressive disorder ([place])     History of  . History of iron deficiency   . COPD (chronic obstructive pulmonary disease) ([place])   . History of acute alcoholic hepatitis   . Psychiatric diagnosis     Multiple  . 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 Left breast mass    Past Surgical History  Procedure Laterality Date  . Craniotomy    . Breast mass removal    . Brain surgery    . Breast surgery    .  Left heart catheterization with coronary angiogram N/A 06/06/2011    Procedure: LEFT HEART CATHETERIZATION WITH CORONARY ANGIOGRAM;  Surgeon:  Burnell Blanks, MD;  Location: [facility] CATH LAB;  Service: Cardiovascular;  Laterality: N/A;   Family History  Problem Relation Age of Onset  . Coronary artery disease Father    Social History   Social History  . Marital Status: Legally Separated    Spouse Name: N/A  . Number of Children: N/A  . Years of Education: N/A   Occupational History  . Not on file.   Social History Main Topics  . Smoking status: Current Every Day Smoker -- 1.00 packs/day for 30 years    Types: Cigarettes  . Smokeless tobacco: Not on file  . Alcohol Use: 4.8 oz/week    8 Cans of beer per week  . Drug Use: Yes    Special: Cocaine     Comment: not in years  . Sexual Activity: No   Other Topics Concern  . Not on file   Social History Narrative    Review of Systems: Review of Systems  Constitutional: Negative for fever, chills and diaphoresis.  Eyes: Negative for blurred vision and double vision.  Respiratory: Positive for cough, shortness of breath and wheezing. Negative for hemoptysis and sputum production.   Cardiovascular: Negative for chest pain, palpitations and leg swelling.  Gastrointestinal: Negative for nausea, vomiting, abdominal pain, diarrhea, constipation, blood in stool and melena.  Genitourinary: Negative for dysuria and frequency.  Musculoskeletal: Negative for falls.  Skin: Negative for rash.  Neurological: Negative for dizziness and headaches.  Psychiatric/Behavioral: Negative for depression.   Physical Exam: Blood pressure 132/76, pulse 108, temperature 98 F (36.7 C), temperature source Oral, resp. rate 31, height '5\' 9"'$  (1.753 m), weight 123 lb (55.792 kg), SpO2 97 %. General: alert, well-developed, and cooperative to examination.  Head: normocephalic and atraumatic. Surgery scar/depression in right temporal region. Eyes: vision  grossly intact, pupils equal, pupils round, pupils reactive to light, no injection and anicteric.  Mouth: pharynx pink and moist, no erythema, and no exudates.  Neck: supple, full ROM, no thyromegaly, no JVD, and no carotid bruits.  Lungs: normal respiratory effort, no accessory muscle use, normal breath sounds, no crackles, and no wheezes. Heart: normal rate, regular rhythm, no murmur, no gallop, and no rub.  Abdomen: soft, non-tender, normal bowel sounds, no distention, no guarding, no rebound tenderness, no hepatomegaly, and no splenomegaly.  Msk: no joint swelling, no joint warmth, and no redness over joints.  Pulses: 2+ DP/PT pulses bilaterally Extremities: No cyanosis, clubbing, edema Neurologic: alert & oriented X3, cranial nerves II-XII intact, strength normal in all extremities, sensation intact to light touch Skin: turgor normal and no rashes.  Psych: normal mood and affect   Lab results: Basic Metabolic Panel:  Recent Labs  04/22/15 1143  NA 140  K 3.8  CL 103  GLUCOSE 117*  BUN 6  CREATININE 0.50   CBC:  Recent Labs  04/22/15 1134 04/22/15 1143  WBC 8.6  --   NEUTROABS 7.1  --   HGB 15.2* 17.0*  HCT 46.1* 50.0*  MCV 96.2  --   PLT 110*  --    Urine Drug Screen: Drugs of Abuse     Component Value Date/Time   LABOPIA NONE DETECTED 01/10/2014 0745   LABOPIA NEGATIVE 02/08/2011 0732   COCAINSCRNUR NONE DETECTED 01/10/2014 0745   COCAINSCRNUR NEGATIVE 02/08/2011 0732   LABBENZ NONE DETECTED 01/10/2014 0745   LABBENZ POSITIVE* 02/08/2011 0732   AMPHETMU NONE DETECTED 01/10/2014 0745   AMPHETMU NEGATIVE 02/08/2011 0732   THCU NONE DETECTED 01/10/2014 0745  LABBARB NONE DETECTED 01/10/2014 0745    Alcohol Level: No results for input(s): ETH in the last 72 hours. Urinalysis: No results for input(s): COLORURINE, LABSPEC, PHURINE, GLUCOSEU, HGBUR, BILIRUBINUR, KETONESUR, PROTEINUR, UROBILINOGEN, NITRITE, LEUKOCYTESUR in the last 72 hours.  Invalid  input(s): APPERANCEUR   Imaging results:  Dg Chest 2 View  04/22/2015  CLINICAL DATA:  Nonproductive cough.  Labored breathing. EXAM: CHEST  2 VIEW COMPARISON:  01/10/2014 FINDINGS: The heart size and mediastinal contours are within normal limits. Lungs are hyperinflated and there are coarsened interstitial markings noted bilaterally. No focal airspace opacity. The visualized skeletal structures are unremarkable. IMPRESSION: 1. No acute findings. 2. Hyperinflation and coarsened interstitial markings of emphysema. Electronically Signed   By: Kerby Moors M.D.   On: 04/22/2015 11:52    Other results: EKG: Sinus tachycardia Low voltage, precordial leads No significant change since last tracing  Assessment & Plan by Problem: 59 year old female with COPD and active smoker presents with 1 day history of worsening shortness of breath refractory to albuterol at home.   COPD exacerbation: Patient with worsening shortness of breath over the past day. Improved with albuterol and steroids in ED but desaturated with ambulation. She is on Spiriva and Advair at home. Reports increased use of her albuterol inhaler over the past week, 4-5 times daily from baseline of 2-3. Current everyday smoker, 1 PPD.  -Duonebs q4hr  -Albuterol nebs q6hr prn -Prednisone 40 mg qdaily in am -nicotine patch  Peripheral neuropathy: No history of DM. Possibly ETOH abuse related. Reports worsening numbness in bilateral feet. -A1c -B12 -TSH  Seizure disorder with VP shunt in 2009: On Keppra. Reports no seizures in last year.  -Continue Keppra.  -Benzos prn  HTN: On lisinopril, spironolactone and metoprolol at home. BP stable in ED.  -Continue metoprolol   Hx of non-ischemic cardiomyopathy: stable. Last EF 60-65% in 12/2013. Continue ASA, BB, ACE-I, statin.   Tobacco abuse: Smokes 1 PPD. Encouraged cessation. nicotine patch  Hx of ETOH abuse: Reports 1-2 drinks a week. Last ETOH was 12/21.  -CIWA   DVT PPx:  Heparin   Dispo: Disposition is deferred at this time, awaiting improvement of current medical problems. Anticipated discharge in approximately 1-2 day(s).   The patient does have a current PCP Saralyn Pilar, MD) and does need an [facility] hospital follow-up appointment after discharge.  The patient does not have transportation limitations that hinder transportation to clinic appointments.  Signed: Maryellen Pile, MD 04/22/2015, 6:21 PM

## 2015-04-22 NOTE — Progress Notes (Addendum)
Called by nurse that patient having increased dyspnea and work of breathing despite two breathing treatments already. Upon evaluation, patient is in tripod position, tachypneic, and using her accessory muscles. She is saturating 96% on 2 L oxygen while getting a breathing treatment. She states her breathing feels better with the breathing treatment.   Physical Exam BP 115/80, HR 109, RR 28, O2 sat 96% General: middle aged woman in mild respiratory distress, anxious, in tripod position HEENT: [place]/AT, EOMI, sclera anicteric CV: tachycardic, no m/g/r Pulm: no wheezing appreciated, moving small volumes of air  Ext: warm, no edema Neuro: alert and oriented x 3   A/P: Wanda Prince is a 59yo woman with COPD, depression, and polysubstance abuse being treated for a COPD exacerbation and now with increased respiratory distress. Likely some component of her respiratory distress is related to anxiety and possible withdrawal from EtOH.  - Will give Solumedrol 125 mg x 1 - STAT pCXR - Transfer to step down unit so can get BiPAP - BiPAP PRN - Ok to maintain oxygen sats in low 90s - Increase albuterol nebs to Q2H PRN - Continue Duonebs Q4H - Can consider additional Ativan PRN to help with anxiety - Nurse instructed to call if patient's clinical status worsens. Patient has been intubated before. Will contact PCCM if intubation required. Will continue to monitor.   Wanda Felling, MD, MPH Internal Medicine Resident, PGY-II Pager: [phone]

## 2015-04-22 NOTE — ED Provider Notes (Signed)
CSN: [id number]     Arrival date & time 04/22/15  1037 History   First MD Initiated Contact with Patient 04/22/15 1059     Chief Complaint  Patient presents with  . Shortness of Breath     (Consider location/radiation/quality/duration/timing/severity/associated sxs/prior Treatment) Patient is a 59 y.o. female presenting with shortness of breath.  Shortness of Breath Associated symptoms: cough    Complains of shortness of breath gradual onset last night feels like COPD she's had in the past. Symptoms currently by nonproductive cough. Patient treated with albuterol at home and also received 10 mg albuterol neb by EMS as well as Solu-Medrol 125 mg IV, and supplemental oxygen. Nothing makes symptoms better or worse. No other associated symptoms. No fever no chest pain. Past Medical History  Diagnosis Date  . Meningitis 2009  . Hx of ventricular shunt 2009  . Seizure disorder ([place])   . 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 Suicide attempt [facility])     History of suicide attempts August 2006 and July 2006  . History of alcohol abuse   . History of cocaine abuse   . Major depressive disorder ([place])     History of  . History of iron deficiency   . COPD (chronic obstructive pulmonary disease) ([place])   . History of acute alcoholic hepatitis   . Psychiatric diagnosis     Multiple  . 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 Left breast mass    hepatitis C Past Surgical History  Procedure Laterality Date  . Craniotomy    . Breast mass removal    . Brain surgery    . Breast surgery    . Left heart catheterization with coronary angiogram N/A 06/06/2011    Procedure: LEFT HEART CATHETERIZATION WITH CORONARY ANGIOGRAM;  Surgeon: Burnell Blanks, MD;  Location: [facility] CATH LAB;  Service: Cardiovascular;  Laterality: N/A;   Family  History  Problem Relation Age of Onset  . Coronary artery disease Father    Social History  Substance Use Topics  . Smoking status: Current Every Day Smoker -- 1.00 packs/day for 30 years    Types: Cigarettes  . Smokeless tobacco: None  . Alcohol Use: 4.8 oz/week    8 Cans of beer per week   OB History    Gravida Para Term Preterm AB TAB SAB Ectopic Multiple Living   '2 1   1          '$ Review of Systems  Constitutional: Negative.   HENT: Negative.   Respiratory: Positive for cough and shortness of breath.   Cardiovascular: Negative.   Gastrointestinal: Negative.   Musculoskeletal: Negative.   Skin: Negative.   Neurological: Negative.   Psychiatric/Behavioral: Negative.   All other systems reviewed and are negative.     Allergies  Review of patient's allergies indicates no known allergies.  Home Medications   Prior to Admission medications   Medication Sig Start Date End Date Taking? Authorizing Provider  acetaminophen (TYLENOL) 500 MG tablet Take 1,000 mg by mouth every 6 (six) hours as needed for pain.    Historical Provider, MD  albuterol (PROVENTIL HFA;VENTOLIN HFA) 108 (90 BASE) MCG/ACT inhaler Inhale 2 puffs into the lungs every 4 (four) hours as needed for wheezing or shortness of breath.     Historical Provider, MD  albuterol (  PROVENTIL) (2.5 MG/3ML) 0.083% nebulizer solution Take 2.5 mg by nebulization every 6 (six) hours as needed for wheezing or shortness of breath.    Historical Provider, MD  aspirin 81 MG chewable tablet Chew 81 mg by mouth daily.    Historical Provider, MD  b complex vitamins tablet Take 1 tablet by mouth 2 (two) times daily. 01/28/12   Eugenie Filler, MD  cyclobenzaprine (FLEXERIL) 5 MG tablet Take 5 mg by mouth every 8 (eight) hours as needed for muscle spasms.  11/22/13   Historical Provider, MD  diclofenac sodium (VOLTAREN) 1 % GEL Apply 4 g topically 4 (four) times daily.     Historical Provider, MD  Fluticasone-Salmeterol (ADVAIR)  250-50 MCG/DOSE AEPB Inhale 1 puff into the lungs every 12 (twelve) hours. 06/07/11   Dorian Heckle, MD  folic acid (FOLVITE) 1 MG tablet Take 1 mg by mouth daily.    Historical Provider, MD  gabapentin (NEURONTIN) 300 MG capsule Take 300 mg by mouth at bedtime.     Historical Provider, MD  lactulose (CHRONULAC) 10 GM/15ML solution Take 15 mLs (10 g total) by mouth 2 (two) times daily. 01/16/14   Silver Huguenin Elgergawy, MD  levETIRAcetam (KEPPRA) 500 MG tablet Take 1 tablet (500 mg total) by mouth 2 (two) times daily. 01/28/12   Eugenie Filler, MD  lisinopril (PRINIVIL,ZESTRIL) 10 MG tablet Take 10 mg by mouth daily.    Historical Provider, MD  methylPREDNIsolone (MEDROL DOSPACK) 4 MG tablet follow package directions 01/16/14   Albertine Patricia, MD  metoprolol succinate (TOPROL XL) 25 MG 24 hr tablet Take 25 mg by mouth daily.  09/30/13   Historical Provider, MD  Multiple Vitamin (MULITIVITAMIN WITH MINERALS) TABS Take 1 tablet by mouth daily. 06/07/11   Dorian Heckle, MD  phenytoin (DILANTIN) 100 MG ER capsule Take 100-200 mg by mouth 2 (two) times daily. 2 capsules (200 mg) in the am and 1 capsule (100 mg) in the evening    Historical Provider, MD  PRESCRIPTION MEDICATION Take by mouth 2 (two) times daily. For ammonia in liver    Historical Provider, MD  spironolactone (ALDACTONE) 25 MG tablet Take 25 mg by mouth. 12/08/13 12/08/14  Historical Provider, MD  thiamine 100 MG tablet Take 1 tablet (100 mg total) by mouth daily. 06/07/11   Dorian Heckle, MD  tiotropium (SPIRIVA) 18 MCG inhalation capsule Place 1 capsule (18 mcg total) into inhaler and inhale daily. 06/07/11   Dorian Heckle, MD  Vitamin D, Ergocalciferol, (DRISDOL) 50000 UNITS CAPS Take 50,000 Units by mouth every 7 (seven) days. On Mondays    Historical Provider, MD   BP 130/92 mmHg  Pulse 106  Temp(Src) 98.6 F (37 C) (Oral)  Resp 24  Ht '5\' 9"'$  (1.753 m)  Wt 123 lb (55.792 kg)  BMI 18.16 kg/m2  SpO2 100% Physical Exam  Constitutional:  She appears well-developed and well-nourished. She appears distressed.  Moderate to severe respiratory distress  HENT:  Head: Normocephalic and atraumatic.  Eyes: Conjunctivae are normal. Pupils are equal, round, and reactive to light.  Neck: Neck supple. No tracheal deviation present. No thyromegaly present.  Cardiovascular: Normal rate and regular rhythm.   No murmur heard. Pulmonary/Chest: She is in respiratory distress.  Minutes breath sounds diffusely, retractions  Abdominal: Soft. Bowel sounds are normal. She exhibits no distension. There is no tenderness.  Musculoskeletal: Normal range of motion. She exhibits no edema or tenderness.  Neurological: She is alert. Coordination normal.  Skin: Skin is warm and  dry. No rash noted.  Psychiatric: She has a normal mood and affect.  Nursing note and vitals reviewed.   ED Course  Procedures (including critical care time) Labs Review Labs Reviewed  CBC WITH DIFFERENTIAL/PLATELET  I-STAT CHEM 8, ED    Imaging Review No results found. I have personally reviewed and evaluated these images and lab results as part of my medical decision-making.   EKG Interpretation   Date/Time:  Saturday April 22 2015 10:44:54 EST Ventricular Rate:  106 PR Interval:  186 QRS Duration: 76 QT Interval:  346 QTC Calculation: 459 R Axis:   75 Text Interpretation:  Sinus tachycardia Low voltage, precordial leads No  significant change since last tracing Confirmed by Jeyda Siebel  MD, Trae Bovenzi  [id number]) on 04/22/2015 11:16:50 AM     Patient received continuous nebulization for 1 hour with improvement of breathing. However upon getting up to walk in the emergency department her pulse oximetry desaturated to 88% on room air and she became acutely dyspneic. Chest x-ray viewed by me Results for orders placed or performed during the hospital encounter of 04/22/15  CBC with Differential/Platelet  Result Value Ref Range   WBC 8.6 4.0 - 10.5 K/uL   RBC 4.79 3.87  - 5.11 MIL/uL   Hemoglobin 15.2 (H) 12.0 - 15.0 g/dL   HCT 46.1 (H) 36.0 - 46.0 %   MCV 96.2 78.0 - 100.0 fL   MCH 31.7 26.0 - 34.0 pg   MCHC 33.0 30.0 - 36.0 g/dL   RDW 13.3 11.5 - 15.5 %   Platelets 110 (L) 150 - 400 K/uL   Neutrophils Relative % 83 %   Neutro Abs 7.1 1.7 - 7.7 K/uL   Lymphocytes Relative 12 %   Lymphs Abs 1.0 0.7 - 4.0 K/uL   Monocytes Relative 4 %   Monocytes Absolute 0.3 0.1 - 1.0 K/uL   Eosinophils Relative 1 %   Eosinophils Absolute 0.1 0.0 - 0.7 K/uL   Basophils Relative 0 %   Basophils Absolute 0.0 0.0 - 0.1 K/uL  I-stat chem 8, ed  Result Value Ref Range   Sodium 140 135 - 145 mmol/L   Potassium 3.8 3.5 - 5.1 mmol/L   Chloride 103 101 - 111 mmol/L   BUN 6 6 - 20 mg/dL   Creatinine, Ser 0.50 0.44 - 1.00 mg/dL   Glucose, Bld 117 (H) 65 - 99 mg/dL   Calcium, Ion 1.10 (L) 1.12 - 1.23 mmol/L   TCO2 27 0 - 100 mmol/L   Hemoglobin 17.0 (H) 12.0 - 15.0 g/dL   HCT 50.0 (H) 36.0 - 46.0 %   Dg Chest 2 View  04/22/2015  CLINICAL DATA:  Nonproductive cough.  Labored breathing. EXAM: CHEST  2 VIEW COMPARISON:  01/10/2014 FINDINGS: The heart size and mediastinal contours are within normal limits. Lungs are hyperinflated and there are coarsened interstitial markings noted bilaterally. No focal airspace opacity. The visualized skeletal structures are unremarkable. IMPRESSION: 1. No acute findings. 2. Hyperinflation and coarsened interstitial markings of emphysema. Electronically Signed   By: Kerby Moors M.D.   On: 04/22/2015 11:52    MDM  I consulted Dr Hulen Luster, from internal medicine service. She will see patient in the emergency department PLAN patient requires further nebulized treatments and 23 hour observation to telemetry floor, nicotine patch ordered Final diagnoses:  None   Diagnosis #1 exacerbation of COPD #2 tobacco abuse     Orlie Dakin, MD 04/22/15 1713

## 2015-04-22 NOTE — ED Notes (Signed)
Pt resting more comfortably much less labored breathing athan arrival. Sister remains at bedside'

## 2015-04-22 NOTE — ED Notes (Signed)
Pt arrives EMS with [facility] since last night. Breathing treatment at home. 87%RA per ems.

## 2015-04-22 NOTE — ED Notes (Signed)
Pt to xray. Breathing less labored than arrival.

## 2015-04-22 NOTE — Progress Notes (Signed)
Pt SOB on assessment, worsened during shift. Pt given breathing treatments per RT, given ativan '1mg'$  for anxiety.   Pt lung sounds diminished throughout, sitting up in bed with RR 25-30, O2 sat 94-97% on 2Lnc. MD notified and new orders given for solu-medrol and transfer to stepdown. Pt notified and Report called to RN on [place]. Pt requesting to not notify her family until the morning.

## 2015-04-23 DIAGNOSIS — F1010 Alcohol abuse, uncomplicated: Secondary | ICD-10-CM | POA: Diagnosis present

## 2015-04-23 DIAGNOSIS — Z681 Body mass index (BMI) 19 or less, adult: Secondary | ICD-10-CM | POA: Diagnosis not present

## 2015-04-23 DIAGNOSIS — R451 Restlessness and agitation: Secondary | ICD-10-CM | POA: Diagnosis not present

## 2015-04-23 DIAGNOSIS — Z0189 Encounter for other specified special examinations: Secondary | ICD-10-CM | POA: Diagnosis not present

## 2015-04-23 DIAGNOSIS — J9601 Acute respiratory failure with hypoxia: Secondary | ICD-10-CM | POA: Diagnosis present

## 2015-04-23 DIAGNOSIS — G40909 Epilepsy, unspecified, not intractable, without status epilepticus: Secondary | ICD-10-CM | POA: Diagnosis present

## 2015-04-23 DIAGNOSIS — R64 Cachexia: Secondary | ICD-10-CM | POA: Diagnosis present

## 2015-04-23 DIAGNOSIS — Z4659 Encounter for fitting and adjustment of other gastrointestinal appliance and device: Secondary | ICD-10-CM | POA: Diagnosis not present

## 2015-04-23 DIAGNOSIS — J441 Chronic obstructive pulmonary disease with (acute) exacerbation: Secondary | ICD-10-CM | POA: Diagnosis present

## 2015-04-23 DIAGNOSIS — Z915 Personal history of self-harm: Secondary | ICD-10-CM | POA: Diagnosis not present

## 2015-04-23 DIAGNOSIS — G9340 Encephalopathy, unspecified: Secondary | ICD-10-CM | POA: Diagnosis not present

## 2015-04-23 DIAGNOSIS — J9602 Acute respiratory failure with hypercapnia: Secondary | ICD-10-CM | POA: Diagnosis present

## 2015-04-23 DIAGNOSIS — Z930 Tracheostomy status: Secondary | ICD-10-CM | POA: Diagnosis not present

## 2015-04-23 DIAGNOSIS — I10 Essential (primary) hypertension: Secondary | ICD-10-CM | POA: Diagnosis present

## 2015-04-23 DIAGNOSIS — D696 Thrombocytopenia, unspecified: Secondary | ICD-10-CM | POA: Diagnosis present

## 2015-04-23 DIAGNOSIS — R918 Other nonspecific abnormal finding of lung field: Secondary | ICD-10-CM | POA: Diagnosis present

## 2015-04-23 DIAGNOSIS — F10231 Alcohol dependence with withdrawal delirium: Secondary | ICD-10-CM | POA: Diagnosis not present

## 2015-04-23 DIAGNOSIS — Z801 Family history of malignant neoplasm of trachea, bronchus and lung: Secondary | ICD-10-CM | POA: Diagnosis not present

## 2015-04-23 DIAGNOSIS — Z7982 Long term (current) use of aspirin: Secondary | ICD-10-CM | POA: Diagnosis not present

## 2015-04-23 DIAGNOSIS — Z982 Presence of cerebrospinal fluid drainage device: Secondary | ICD-10-CM | POA: Diagnosis not present

## 2015-04-23 DIAGNOSIS — Z7951 Long term (current) use of inhaled steroids: Secondary | ICD-10-CM | POA: Diagnosis not present

## 2015-04-23 DIAGNOSIS — R160 Hepatomegaly, not elsewhere classified: Secondary | ICD-10-CM | POA: Diagnosis present

## 2015-04-23 DIAGNOSIS — G629 Polyneuropathy, unspecified: Secondary | ICD-10-CM | POA: Diagnosis present

## 2015-04-23 DIAGNOSIS — R000 Tachycardia, unspecified: Secondary | ICD-10-CM | POA: Diagnosis not present

## 2015-04-23 DIAGNOSIS — F17210 Nicotine dependence, cigarettes, uncomplicated: Secondary | ICD-10-CM | POA: Diagnosis present

## 2015-04-23 DIAGNOSIS — Z781 Physical restraint status: Secondary | ICD-10-CM | POA: Diagnosis not present

## 2015-04-23 DIAGNOSIS — I428 Other cardiomyopathies: Secondary | ICD-10-CM | POA: Diagnosis present

## 2015-04-23 DIAGNOSIS — Z8661 Personal history of infections of the central nervous system: Secondary | ICD-10-CM | POA: Diagnosis not present

## 2015-04-23 LAB — CBC
HCT: 42 % (ref 36.0–46.0)
Hemoglobin: 14.1 g/dL (ref 12.0–15.0)
MCH: 32.2 pg (ref 26.0–34.0)
MCHC: 33.6 g/dL (ref 30.0–36.0)
MCV: 95.9 fL (ref 78.0–100.0)
Platelets: 136 10*3/uL — ABNORMAL LOW (ref 150–400)
RBC: 4.38 MIL/uL (ref 3.87–5.11)
RDW: 13.5 % (ref 11.5–15.5)
WBC: 17.5 10*3/uL — ABNORMAL HIGH (ref 4.0–10.5)

## 2015-04-23 LAB — BLOOD GAS, ARTERIAL
Acid-Base Excess: 4.2 mmol/L — ABNORMAL HIGH (ref 0.0–2.0)
Bicarbonate: 28.3 mEq/L — ABNORMAL HIGH (ref 20.0–24.0)
Drawn by: 437071
FIO2: 0.32
O2 Content: 3 L/min
O2 Saturation: 95.9 %
Patient temperature: 98.6
TCO2: 29.6 mmol/L (ref 0–100)
pCO2 arterial: 43.2 mmHg (ref 35.0–45.0)
pH, Arterial: 7.431 (ref 7.350–7.450)
pO2, Arterial: 78.4 mmHg — ABNORMAL LOW (ref 80.0–100.0)

## 2015-04-23 LAB — VITAMIN B12: Vitamin B-12: 1347 pg/mL — ABNORMAL HIGH (ref 180–914)

## 2015-04-23 LAB — MRSA PCR SCREENING: MRSA by PCR: NEGATIVE

## 2015-04-23 MED ORDER — CETYLPYRIDINIUM CHLORIDE 0.05 % MT LIQD
7.0000 mL | Freq: Two times a day (BID) | OROMUCOSAL | Status: DC
  Administered 2015-04-23 – 2015-04-25 (×5): 7 mL via OROMUCOSAL

## 2015-04-23 MED ORDER — LORAZEPAM 2 MG/ML IJ SOLN
1.0000 mg | INTRAMUSCULAR | Status: DC | PRN
  Filled 2015-04-23: qty 1

## 2015-04-23 MED ORDER — IPRATROPIUM-ALBUTEROL 0.5-2.5 (3) MG/3ML IN SOLN
3.0000 mL | Freq: Four times a day (QID) | RESPIRATORY_TRACT | Status: DC
  Administered 2015-04-22 – 2015-04-24 (×4): 3 mL via RESPIRATORY_TRACT
  Filled 2015-04-23 (×4): qty 3

## 2015-04-23 MED ORDER — LORAZEPAM 1 MG PO TABS: 2.0000 mg | ORAL_TABLET | ORAL | Status: DC | PRN

## 2015-04-23 MED ORDER — WHITE PETROLATUM GEL
Status: AC
  Administered 2015-04-24: 0.2
  Filled 2015-04-23: qty 1

## 2015-04-23 MED ORDER — ALBUTEROL SULFATE (2.5 MG/3ML) 0.083% IN NEBU: 2.5000 mg | INHALATION_SOLUTION | Freq: Three times a day (TID) | RESPIRATORY_TRACT | Status: DC

## 2015-04-23 MED ORDER — LORAZEPAM 1 MG PO TABS: 1.0000 mg | ORAL_TABLET | ORAL | Status: DC | PRN

## 2015-04-23 MED ORDER — LORAZEPAM 2 MG/ML IJ SOLN
1.0000 mg | INTRAMUSCULAR | Status: DC | PRN
  Administered 2015-04-23: 1 mg via INTRAVENOUS

## 2015-04-23 MED ORDER — LORAZEPAM 2 MG/ML IJ SOLN
2.0000 mg | INTRAMUSCULAR | Status: DC | PRN
  Administered 2015-04-23 – 2015-04-24 (×3): 2 mg via INTRAVENOUS
  Filled 2015-04-23 (×3): qty 1

## 2015-04-23 NOTE — Progress Notes (Signed)
Subjective: Episode of dyspnea and increased work of breathing overnight. On BIPAP briefly. No complaints of shortness of breath this morning. Patient became very agitated, confused and combative overnight. Required 3 mg of Ativan and security called with restraints placed. She is much calmer this morning. Still somewhat confused, asking about having her dental crowns replaced. Family in room reports she has had similar episodes in the past when hospitalized. Cannot report if she has had these episodes when receiving steroids in the past. Confirm that she only drinks alcohol occasionally, 1-2 drinks a couple times a month, and is no longer a heavy drinker.    Objective: Vital signs in last 24 hours: Filed Vitals:   04/23/15 0100 04/23/15 0358 04/23/15 0407 04/23/15 0808  BP: 156/98 133/75  132/84  Pulse: 96 103  94  Temp:  98 F (36.7 C)  97.1 F (36.2 C)  TempSrc:  Oral  Oral  Resp: '21 30  24  '$ Height:      Weight:      SpO2: 100% 95% 97% 100%   Weight change:   Intake/Output Summary (Last 24 hours) at 04/23/15 1233 Last data filed at 04/22/15 2200  Gross per 24 hour  Intake    240 ml  Output      0 ml  Net    240 ml    PHYSICAL EXAM GENERAL- alert, co-operative, not in any distress. HEENT- Atraumatic, normocephalic, PERRL, EOMI, oral mucosa appears moist CARDIAC- RRR, no murmurs, rubs or gallops. RESP- Decreased breath sounds at bilateral lung bases, no wheezes or crackles. ABDOMEN- Soft, nontender, bowel sounds present. NEURO- No obvious Cr N abnormality. EXTREMITIES- pulse 2+, symmetric, no pedal edema. SKIN- Warm, dry, No rash or lesion. PSYCH- Anxious mood and affect  Medications: I have reviewed the patient's current medications. Scheduled Meds: . albuterol  2.5 mg Nebulization TID  . antiseptic oral rinse  7 mL Mouth Rinse BID  . aspirin  81 mg Oral Daily  . b complex vitamins  1 tablet Oral BID  . cholecalciferol  1,000 Units Oral Daily  . folic acid  1 mg  Oral Daily  . gabapentin  300 mg Oral QHS  . heparin  5,000 Units Subcutaneous 3 times per day  . levETIRAcetam  500 mg Oral BID  . metoprolol succinate  25 mg Oral Daily  . multivitamin with minerals  1 tablet Oral Daily  . nicotine  21 mg Transdermal Daily  . thiamine  100 mg Oral Daily   Continuous Infusions:  PRN Meds:.albuterol, cyclobenzaprine, LORazepam **OR** LORazepam Assessment/Plan:  COPD exacerbation: Patient with episode of worsening dyspnea and work of breathing overnight despite 2 breathing treatments. Received second dose of solu-medrol. She was briefly put on BIPAP for 2 hours but patient became agitated and tore off the mask. CXR with no acute process. Patient became very agitated, confused and combative overnight. Required 3 mg of Ativan and security called with restraints placed. She is much calmer this morning. Still somewhat confused, asking about having her dental crowns replaced. Family in room reports she has had similar episodes in the past when hospitalized. Cannot report if she has had these episodes when receiving steroids in the past. Confirm that she only drinks alcohol occasionally, 1-2 drinks a couple times a month, and is no longer a heavy drinker. ETOH withdrawal vs steroid induced psychosis. ABG overnight with pH 7.4, pCO2 43, pO2 28. 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 Will continue CIWA and hold steroids. Monitor.   Low TSH: TSH 0.278. Checking T4, free T3.   Peripheral neuropathy: No history of DM. Possibly ETOH abuse related. Reports worsening numbness in bilateral feet. -Gabapentin 300 mg qhs -A1c pending -B12 1347  Seizure disorder with VP shunt in 2009: On Keppra. Reports no seizures in last year.  -Continue Keppra.  -Benzos prn  HTN: On lisinopril, spironolactone and metoprolol at home. BP stable. -Continue metoprolol   Hx  of non-ischemic cardiomyopathy: stable. Last EF 60-65% in 12/2013. Continue ASA, BB, ACE-I, statin.   Tobacco abuse: Smokes 1 PPD. Encouraged cessation. -nicotine patch  DVT PPx: Heparin   Dispo: Disposition is deferred at this time, awaiting improvement of current medical problems.  Anticipated discharge in approximately 1 day(s).   The patient does have a current PCP Saralyn Pilar, MD) and does need an [facility] hospital follow-up appointment after discharge.  The patient does not have transportation limitations that hinder transportation to clinic appointments.  .Services Needed at time of discharge: Y = Yes, Blank = No PT:   OT:   RN:   Equipment:   Other:       Maryellen Pile, MD IMTS PGY-1 [phone] 04/23/2015, 12:33 PM

## 2015-04-23 NOTE — Progress Notes (Signed)
Pt arrived around [place] from [place] to 2C05. Pt is in stable condition, AOx4, Mild tachycardia with labored breathing but able to carry on conversations.  Oxygen saturation 98% on 2L [place].  ABG done and was normal. BIPAP applied per MD order, however pt only tolerated it until 0200 and stated she "wanted it off and just wanted to go to sleep". RN will continue to monitor.

## 2015-04-23 NOTE — Progress Notes (Signed)
Pt becoming increasingly combative, confused, agitated.  Attempting to hit staff with cane, and kick etc... Security called. Restraints applied. MD made aware of changes in pt status. Requested order for sitter and restraints. And increase in Ativan dose.

## 2015-04-23 NOTE — Progress Notes (Signed)
Pt has become increasingly more confused, agitated, anxious - pulling out IV's, removing clothing, removing all monitoring equipment.  Required for tech to sit with her.  Pt repeatedly states she wants to "smoke a cigarette and have a beer". MD made aware. RN will continue to monitor.

## 2015-04-23 NOTE — Progress Notes (Signed)
Patient placed on BiPPA due to increased WOB. Patient is tolerating it well and RT will continue to monitor.

## 2015-04-23 NOTE — Progress Notes (Signed)
Pt reported to RN on admission to 2C05 that she drinks around 3-4 beers A DAY and smokes a pack of cigarettes a day.

## 2015-04-24 DIAGNOSIS — G9340 Encephalopathy, unspecified: Secondary | ICD-10-CM

## 2015-04-24 DIAGNOSIS — J441 Chronic obstructive pulmonary disease with (acute) exacerbation: Principal | ICD-10-CM

## 2015-04-24 LAB — BLOOD GAS, ARTERIAL
Acid-Base Excess: 6.1 mmol/L — ABNORMAL HIGH (ref 0.0–2.0)
Acid-Base Excess: 6.8 mmol/L — ABNORMAL HIGH (ref 0.0–2.0)
Bicarbonate: 33.7 mEq/L — ABNORMAL HIGH (ref 20.0–24.0)
Bicarbonate: 33.4 mEq/L — ABNORMAL HIGH (ref 20.0–24.0)
Delivery systems: POSITIVE
Delivery systems: POSITIVE
Drawn by: 313061
Drawn by: 331761
Expiratory PAP: 5
Expiratory PAP: 5
FIO2: 0.5
FIO2: 0.5
Inspiratory PAP: 10
Inspiratory PAP: 12
O2 Saturation: 97.6 %
O2 Saturation: 96.6 %
Patient temperature: 98.6
Patient temperature: 98.5
RATE: 8 resp/min
TCO2: 36.3 mmol/L (ref 0–100)
TCO2: 35.7 mmol/L (ref 0–100)
pCO2 arterial: 87.1 mmHg (ref 35.0–45.0)
pCO2 arterial: 73.7 mmHg (ref 35.0–45.0)
pH, Arterial: 7.211 — ABNORMAL LOW (ref 7.350–7.450)
pH, Arterial: 7.278 — ABNORMAL LOW (ref 7.350–7.450)
pO2, Arterial: 117 mmHg — ABNORMAL HIGH (ref 80.0–100.0)
pO2, Arterial: 92.5 mmHg (ref 80.0–100.0)

## 2015-04-24 LAB — POCT I-STAT 3, ART BLOOD GAS (G3+)
Acid-Base Excess: 4 mmol/L — ABNORMAL HIGH (ref 0.0–2.0)
Bicarbonate: 32.6 mEq/L — ABNORMAL HIGH (ref 20.0–24.0)
O2 Saturation: 93 %
Patient temperature: 98.6
TCO2: 35 mmol/L (ref 0–100)
pCO2 arterial: 64.5 mmHg (ref 35.0–45.0)
pH, Arterial: 7.311 — ABNORMAL LOW (ref 7.350–7.450)
pO2, Arterial: 76 mmHg — ABNORMAL LOW (ref 80.0–100.0)

## 2015-04-24 LAB — T3, FREE: T3, Free: 2.1 pg/mL (ref 2.0–4.4)

## 2015-04-24 LAB — T4: T4, Total: 11.3 ug/dL (ref 4.5–12.0)

## 2015-04-24 LAB — GLUCOSE, CAPILLARY: Glucose-Capillary: 87 mg/dL (ref 65–99)

## 2015-04-24 MED ORDER — IPRATROPIUM-ALBUTEROL 0.5-2.5 (3) MG/3ML IN SOLN
3.0000 mL | Freq: Four times a day (QID) | RESPIRATORY_TRACT | Status: DC
  Administered 2015-04-24 – 2015-04-29 (×19): 3 mL via RESPIRATORY_TRACT
  Filled 2015-04-24 (×18): qty 3

## 2015-04-24 MED ORDER — IPRATROPIUM-ALBUTEROL 0.5-2.5 (3) MG/3ML IN SOLN
3.0000 mL | RESPIRATORY_TRACT | Status: DC
  Filled 2015-04-24: qty 3

## 2015-04-24 MED ORDER — CETYLPYRIDINIUM CHLORIDE 0.05 % MT LIQD: 7.0000 mL | Freq: Two times a day (BID) | OROMUCOSAL | Status: DC

## 2015-04-24 MED ORDER — METHYLPREDNISOLONE SODIUM SUCC 125 MG IJ SOLR
60.0000 mg | Freq: Two times a day (BID) | INTRAMUSCULAR | Status: DC
  Administered 2015-04-24 – 2015-04-28 (×9): 60 mg via INTRAVENOUS
  Filled 2015-04-24 (×4): qty 0.96
  Filled 2015-04-24: qty 2
  Filled 2015-04-24 (×2): qty 0.96
  Filled 2015-04-24: qty 2
  Filled 2015-04-24 (×2): qty 0.96

## 2015-04-24 MED ORDER — SODIUM CHLORIDE 0.9 % IV SOLN
500.0000 mg | Freq: Two times a day (BID) | INTRAVENOUS | Status: DC
  Administered 2015-04-24 – 2015-05-01 (×14): 500 mg via INTRAVENOUS
  Filled 2015-04-24 (×17): qty 5

## 2015-04-24 MED ORDER — ARFORMOTEROL TARTRATE 15 MCG/2ML IN NEBU
15.0000 ug | INHALATION_SOLUTION | Freq: Two times a day (BID) | RESPIRATORY_TRACT | Status: DC
  Administered 2015-04-24 – 2015-04-30 (×13): 15 ug via RESPIRATORY_TRACT
  Filled 2015-04-24 (×16): qty 2

## 2015-04-24 MED ORDER — BUDESONIDE 0.5 MG/2ML IN SUSP
0.5000 mg | Freq: Two times a day (BID) | RESPIRATORY_TRACT | Status: DC
  Administered 2015-04-24 – 2015-04-30 (×12): 0.5 mg via RESPIRATORY_TRACT
  Filled 2015-04-24 (×15): qty 2

## 2015-04-24 MED ORDER — METHYLPREDNISOLONE SODIUM SUCC 40 MG IJ SOLR: 40.0000 mg | Freq: Once | INTRAMUSCULAR | Status: DC

## 2015-04-24 MED ORDER — CHLORHEXIDINE GLUCONATE 0.12 % MT SOLN
15.0000 mL | Freq: Two times a day (BID) | OROMUCOSAL | Status: DC
  Administered 2015-04-24 – 2015-04-25 (×2): 15 mL via OROMUCOSAL

## 2015-04-24 MED ORDER — BUDESONIDE 0.25 MG/2ML IN SUSP
0.2500 mg | Freq: Two times a day (BID) | RESPIRATORY_TRACT | Status: DC
  Administered 2015-04-24: 0.25 mg via RESPIRATORY_TRACT
  Filled 2015-04-24: qty 2

## 2015-04-24 MED ORDER — DEXMEDETOMIDINE HCL IN NACL 200 MCG/50ML IV SOLN
0.4000 ug/kg/h | INTRAVENOUS | Status: DC
  Administered 2015-04-24: 0.4 ug/kg/h via INTRAVENOUS
  Administered 2015-04-24 – 2015-04-25 (×6): 1 ug/kg/h via INTRAVENOUS
  Filled 2015-04-24 (×9): qty 50

## 2015-04-24 NOTE — Progress Notes (Signed)
Pt had low sats due to pt removing her oxygen, took pt a long time to recover and oxygen slowly got to 86% but would not go above that. (on 4L [place]) RT was called to place BIPAP. Restraints were taken off prior to BIPAP placement. Pt only tolerated BIPAP for 25 mins. If BIPAP is needed again RN will give ativan before placement to ensure pt can tolerate it. WOB is still increased by not as bad, pt is currently asleep. RN will continue to monitor.

## 2015-04-24 NOTE — Progress Notes (Signed)
RT came in to administer nebulizer.  Pt in tripod position with increased WOB.  Pt unable to follow commands and responded minimally.  Pt placed back on BiPAP without complication.  Pt tolerating well.  RN made aware.

## 2015-04-24 NOTE — Progress Notes (Signed)
Pt had incontinent episode and very small amout of urine in bed pan. Bladder scan obtained with 482m in bladder. MD notified.

## 2015-04-24 NOTE — Consult Note (Signed)
PULMONARY / CRITICAL CARE MEDICINE   Name: Wanda Prince MRN: [id number] DOB: [birth year]    ADMISSION DATE:  04/22/2015 CONSULTATION DATE:  04/24/15  REFERRING MD:  Dr. Beryle Beams  CHIEF COMPLAINT:  Altered mental status, Hypercarbic respiratory failure   HISTORY OF PRESENT ILLNESS:   59 y/o F, smoker, with PMH of meningitis, VP shunt, seizure disorder, ETOH abuse / polysubstance abuse, depression with prior suicide attempts, IDA, and COPD with prior respiratory arrest & trach who presented to [facility] on 12/24 with shortness of breath.    The patient activated EMS for SOB.  Room air saturations 87% on EMS arrival and treated with albuterol / '125mg'$  IV solu-medrol.  She reported non-productive cough.  In the ER, she was treated with a one hour albuterol treatment with improvement in symptoms.  Initial CXR consistent with emphysema but no acute infiltrate.  Labs - Na 140, K 3.8, Sr cr 0.50, WBC 17.5, and platelets 136.  ABG on 12/25 7.43 / 43 / 78 / 28.  She was admitted by IMTS for COPD exacerbation.  The patient became agitated overnight on 12/25-26 with concerns for ETOH withdrawal and was treated with CIWA protocol.  Subsequently, she became more somnolent.   Follow up ABG assessed 12/26 7.211 / 87 / 117 / 33.7.  The patient was placed on BiPAP with improvement to 7.27 / 73 / 92 / 33.  PCCM consulted for acute on chronic respiratory failure.    The patient is altered and unable to participate in ROS.  RN reports pt was tripoding early this am and appeared restless / air hungry.   PAST MEDICAL HISTORY :  She  has a past medical history of Meningitis (2009); ventricular shunt (2009); Seizure disorder ([place]); Respiratory arrest ([place]); Abscess (April 2007); Suicide attempt ([place]); History of alcohol abuse; History of cocaine abuse; Major depressive disorder ([place]); History of iron deficiency; COPD (chronic obstructive pulmonary disease) ([place]); History of acute alcoholic hepatitis; Psychiatric diagnosis;  Arteriovenous malformation; and Left breast mass.  PAST SURGICAL HISTORY: She  has past surgical history that includes Craniotomy; breast mass removal; Brain surgery; Breast surgery; and left heart catheterization with coronary angiogram (N/A, 06/06/2011).  No Known Allergies  No current facility-administered medications on file prior to encounter.   Current Outpatient Prescriptions on File Prior to Encounter  Medication Sig  . acetaminophen (TYLENOL) 500 MG tablet Take 1,000 mg by mouth every 6 (six) hours as needed for pain.  Marland Kitchen albuterol (PROVENTIL HFA;VENTOLIN HFA) 108 (90 BASE) MCG/ACT inhaler Inhale 2 puffs into the lungs every 4 (four) hours as needed for wheezing or shortness of breath.   Marland Kitchen albuterol (PROVENTIL) (2.5 MG/3ML) 0.083% nebulizer solution Take 2.5 mg by nebulization every 6 (six) hours as needed for wheezing or shortness of breath.  Marland Kitchen aspirin 81 MG chewable tablet Chew 81 mg by mouth daily.  Marland Kitchen b complex vitamins tablet Take 1 tablet by mouth 2 (two) times daily.  . cyclobenzaprine (FLEXERIL) 5 MG tablet Take 5 mg by mouth every 8 (eight) hours as needed for muscle spasms.   . Fluticasone-Salmeterol (ADVAIR) 250-50 MCG/DOSE AEPB Inhale 1 puff into the lungs every 12 (twelve) hours.  . folic acid (FOLVITE) 1 MG tablet Take 1 mg by mouth daily.  Marland Kitchen gabapentin (NEURONTIN) 300 MG capsule Take 300 mg by mouth at bedtime.   . levETIRAcetam (KEPPRA) 500 MG tablet Take 1 tablet (500 mg total) by mouth 2 (two) times daily.  Marland Kitchen lisinopril (PRINIVIL,ZESTRIL) 10 MG tablet Take 10  mg by mouth daily.  . metoprolol succinate (TOPROL XL) 25 MG 24 hr tablet Take 25 mg by mouth daily.   . Multiple Vitamin (MULITIVITAMIN WITH MINERALS) TABS Take 1 tablet by mouth daily.  Marland Kitchen spironolactone (ALDACTONE) 25 MG tablet Take 25 mg by mouth daily.   Marland Kitchen thiamine 100 MG tablet Take 1 tablet (100 mg total) by mouth daily.  Marland Kitchen tiotropium (SPIRIVA) 18 MCG inhalation capsule Place 1 capsule (18 mcg total) into  inhaler and inhale daily.  . Vitamin D, Ergocalciferol, (DRISDOL) 50000 UNITS CAPS Take 50,000 Units by mouth every 7 (seven) days. On Mondays  . lactulose (CHRONULAC) 10 GM/15ML solution Take 15 mLs (10 g total) by mouth 2 (two) times daily. (Patient not taking: Reported on 04/22/2015)  . methylPREDNIsolone (MEDROL DOSPACK) 4 MG tablet follow package directions (Patient not taking: Reported on 04/22/2015)    FAMILY HISTORY:  Her indicated that her mother is alive. She indicated that her father is deceased. She indicated that both of her sisters are alive. She indicated that her brother is deceased.   SOCIAL HISTORY: She  reports that she has been smoking Cigarettes.  She has a 30 pack-year smoking history. She does not have any smokeless tobacco history on file. She reports that she drinks about 4.8 oz of alcohol per week. She reports that she uses illicit drugs (Cocaine).  REVIEW OF SYSTEMS:   Unable to complete  SUBJECTIVE:    VITAL SIGNS: BP 147/95 mmHg  Pulse 112  Temp(Src) 98.5 F (36.9 C) (Axillary)  Resp 32  Ht '5\' 8"'$  (1.727 m)  Wt 113 lb 6.4 oz (51.438 kg)  BMI 17.25 kg/m2  SpO2 100%  HEMODYNAMICS:    VENTILATOR SETTINGS: Vent Mode:  [-]  FiO2 (%):  [50 %] 50 %  INTAKE / OUTPUT: I/O last 3 completed shifts: In: 360 [P.O.:360] Out: 750 [Urine:750]  PHYSICAL EXAMINATION: General:  Chronically ill appearing female on bipap, appears older than stated age  Neuro:  Somnolent, arouses to follow commands - holds up two fingers & wiggles toes HEENT:  Mask in place, no jvd, old trach scar Cardiovascular:  s1s2 rrr, no m/r/g, tachy  Lungs:  Tachypnea, diffuse wheeze bilaterally  Abdomen:  Non-distended, soft, bsx4 active  Musculoskeletal:  No acute deformities  Skin:  Thin, scattered bruises  LABS:  BMET  Recent Labs Lab 04/22/15 1143  NA 140  K 3.8  CL 103  BUN 6  CREATININE 0.50  GLUCOSE 117*    Electrolytes No results for input(s): CALCIUM, MG, PHOS  in the last 168 hours.  CBC  Recent Labs Lab 04/22/15 1134 04/22/15 1143 04/23/15 0400  WBC 8.6  --  17.5*  HGB 15.2* 17.0* 14.1  HCT 46.1* 50.0* 42.0  PLT 110*  --  136*    Coag's No results for input(s): APTT, INR in the last 168 hours.  Sepsis Markers No results for input(s): LATICACIDVEN, PROCALCITON, O2SATVEN in the last 168 hours.  ABG  Recent Labs Lab 04/23/15 0028 04/24/15 1010 04/24/15 1130  PHART 7.431 7.211* 7.278*  PCO2ART 43.2 87.1* 73.7*  PO2ART 78.4* 117* 92.5    Liver Enzymes No results for input(s): AST, ALT, ALKPHOS, BILITOT, ALBUMIN in the last 168 hours.  Cardiac Enzymes No results for input(s): TROPONINI, PROBNP in the last 168 hours.  Glucose No results for input(s): GLUCAP in the last 168 hours.  Imaging No results found.   STUDIES:    CULTURES:   ANTIBIOTICS:   SIGNIFICANT EVENTS: 12/24  Admit with AECOPD 12/25  Agitation, CIWA 12/26  Hypercarbic respiratory failure on bipap  LINES/TUBES:   DISCUSSION: 59 y/o F, current smoker, with PMH of COPD, prior tracheostomy who presented to [facility] on 12/24 with AECOPD.  Admitted per IMTS, developed agitated delirium and treated with CIWA protocol out of concern for ETOH withdrawal.  Developed worsening hypercarbic respiratory failure, treated with BiPAP & PCCM consulted 12/26.   ASSESSMENT / PLAN:  PULMONARY A: Acute Hypercarbic Respiratory Failure  Acute Exacerbation of COPD  Tobacco Abuse  Hx Prior Tracheostomy / respiratory arrest  P:   BiPAP PRN for increased WOB, monitor closely over next few hours.   Hopeful to avoid intubation.  Would be very difficult to liberate from vent  Intermittent CXR Continue IV steroids, 60 mg BID Continue budesonide, adjust dosing to 0.5 BID Add brovana BID  Adjust duonebs to Q6, hopeful to make PRN in the next few days Transfer to ICU for observation  Nicotine patch  CARDIOVASCULAR A:  Tachycardia  P:  Monitor hemodynamics in  ICU Tele monitoring  ASA QD  RENAL A:   No acute issues  P:   Trend BMP / UOP  Replace electrolytes as indicated   GASTROINTESTINAL A:   At Risk For Aspiration  P:   NPO while on BiPAP   HEMATOLOGIC A:   Thrombocytopenia - mild  P:  Trend CBC  Monitor for evidence of bleeding   INFECTIOUS A:   AECOPD  P:   Monitor fever curve / WBC Suspect rise in WBC related to steroids.  If new fever, consider addition of abx   ENDOCRINE A:   No acute issues  P:   Monitor glucose on BMP   NEUROLOGIC A:   Agitated Delirium - concern for steroid induced Hx Polysubstance Abuse  Concern for ETOH withdrawal  P:   RASS goal: 3 Precedex if needed for agitation D/C CIWA / ativan  Minimize sedation as able Continue thiamine / folate / MVI   FAMILY  - Updates: Sister updated per Dr. [place] Bells.  Family indicate that she has never expressed desires regarding EOL / further ventilation given her history of trach.  Family would want to continue therapies "to make her better".    - Inter-disciplinary family meet or Palliative Care meeting due by:   04/30/15    Noe Gens, NP-C [facility] Pgr: [phone] or if no answer [phone] 04/24/2015, 1:01 PM

## 2015-04-24 NOTE — Progress Notes (Signed)
Pt restrained at this time, BiPAP contraindicated. BiPAP is on standby in pt room if it is needed. RT will continue to monitor as needed

## 2015-04-24 NOTE — Progress Notes (Signed)
Subjective: Patient with another episode of agitation and desaturation overnight. Became confused and only oriented to person. She was briefly placed on BIPAP again but only tolerated 25 minutes before removing it. Increased work of breathing but was able to maintain sats on 4L [place]. She was in tripod position this morning with increased work of breathing. Only oriented to person. Able to tolerate BIPAP with RT present. Only able to speak in one word responses due to shortness of breath.   Objective: Vital signs in last 24 hours: Filed Vitals:   04/24/15 0834 04/24/15 0950 04/24/15 0953 04/24/15 1021  BP:      Pulse: 97   110  Temp:      TempSrc:      Resp: 28  28 33  Height:      Weight:      SpO2: 100% 98% 96% 100%   Weight change:   Intake/Output Summary (Last 24 hours) at 04/24/15 1139 Last data filed at 04/24/15 1045  Gross per 24 hour  Intake    120 ml  Output    750 ml  Net   -630 ml    PHYSICAL EXAM GENERAL- somnolent, sitting in tripod position, in respiratory distress. HEENT- Atraumatic, normocephalic CARDIAC- tachycardic, no murmurs, rubs or gallops. RESP- Decreased breath sounds throughout lung fields with diffuse wheezing ABDOMEN- Soft, nontender, bowel sounds present. NEURO- AAO to person only, No obvious Cr N abnormality. EXTREMITIES- pulse 2+, symmetric, no pedal edema. SKIN- Warm, dry, No rash or lesion.  Medications: I have reviewed the patient's current medications. Scheduled Meds: . antiseptic oral rinse  7 mL Mouth Rinse BID  . aspirin  81 mg Oral Daily  . b complex vitamins  1 tablet Oral BID  . budesonide (PULMICORT) nebulizer solution  0.25 mg Nebulization BID  . cholecalciferol  1,000 Units Oral Daily  . folic acid  1 mg Oral Daily  . gabapentin  300 mg Oral QHS  . heparin  5,000 Units Subcutaneous 3 times per day  . ipratropium-albuterol  3 mL Nebulization Q6H  . levETIRAcetam  500 mg Oral BID  . methylPREDNISolone (SOLU-MEDROL) injection  60  mg Intravenous BID  . metoprolol succinate  25 mg Oral Daily  . multivitamin with minerals  1 tablet Oral Daily  . nicotine  21 mg Transdermal Daily  . thiamine  100 mg Oral Daily   Continuous Infusions:  PRN Meds:.albuterol, cyclobenzaprine, LORazepam **OR** LORazepam Assessment/Plan:  COPD exacerbation: Patient worsening dyspnea and work of breathing overnight. She is very somnolent and only oriented to person today. Easily arousable to voice. She is on BIPAP now but respiratory rate remains in the 30s. Worried she may become fatigued and require intubation. ABG this morning with pH 7.21, pCO2 87, pO2 117 and bicarb 33 after starting BIPAP. Repeat an hour later with pH 7.278, pCO2 73, pO2 95 bicarb 33 but no improvement on exam with mental status or work of breathing. Started Solu-medrol 60 mg IV BID. She did not receive any yesterday due to concerns for steroid psychosis but had agitation overnight after having no steroids >24 hours. Questionable history of current ETOH abuse, family and patient report sparing ETOH intake but nursing note indicates patient reported 3-4 beers daily on arrival to unit. Will consult PCCM for evaluation.  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 Will continue CIWA. Continued agitation off steroids yesterday makes steroid psychosis less likely.    Low TSH: TSH 0.278. T4 free T3 both wnl.   Peripheral neuropathy: No history of DM. Possibly ETOH abuse related. Reports worsening numbness in bilateral feet. -Gabapentin 300 mg qhs -A1c pending -B12 1347  Seizure disorder with VP shunt in 2009: On Keppra. Reports no seizures in last year.  -Continue Keppra.  -Benzos prn  HTN: On lisinopril, spironolactone and metoprolol at home. BP stable. -Continue metoprolol   Hx of  non-ischemic cardiomyopathy: stable. Last EF 60-65% in 12/2013. Continue ASA, BB, ACE-I, statin.   Tobacco abuse: Smokes 1 PPD. Encouraged cessation. -nicotine patch  DVT PPx: Heparin   Dispo: Disposition is deferred at this time, awaiting improvement of current medical problems.   The patient does have a current PCP Saralyn Pilar, MD) and does need an [facility] hospital follow-up appointment after discharge.  The patient does not have transportation limitations that hinder transportation to clinic appointments.  .Services Needed at time of discharge: Y = Yes, Blank = No PT:   OT:   RN:   Equipment:   Other:     LOS: 1 day   Maryellen Pile, MD IMTS PGY-1 [phone] 04/24/2015, 11:39 AM

## 2015-04-24 NOTE — Progress Notes (Signed)
Pt has remained in restraints.  Pt has been asleep most of the time, but when awake pt is completely disoriented to place/time/situation and states that she is 59 yo. No ativan administered while pt remains asleep. RN will continue to monitor.

## 2015-04-25 ENCOUNTER — Inpatient Hospital Stay (HOSPITAL_COMMUNITY): Payer: Medicaid Other

## 2015-04-25 DIAGNOSIS — J9601 Acute respiratory failure with hypoxia: Secondary | ICD-10-CM

## 2015-04-25 DIAGNOSIS — F10231 Alcohol dependence with withdrawal delirium: Secondary | ICD-10-CM

## 2015-04-25 LAB — BLOOD GAS, ARTERIAL
Acid-Base Excess: 2.1 mmol/L — ABNORMAL HIGH (ref 0.0–2.0)
Bicarbonate: 26.5 mEq/L — ABNORMAL HIGH (ref 20.0–24.0)
Delivery systems: POSITIVE
Drawn by: 44166
Expiratory PAP: 8
FIO2: 0.3
Inspiratory PAP: 18
O2 Saturation: 93.5 %
PEEP: 8 cmH2O
Patient temperature: 97
TCO2: 27.8 mmol/L (ref 0–100)
pCO2 arterial: 42.2 mmHg (ref 35.0–45.0)
pH, Arterial: 7.41 (ref 7.350–7.450)
pO2, Arterial: 64.4 mmHg — ABNORMAL LOW (ref 80.0–100.0)

## 2015-04-25 LAB — BASIC METABOLIC PANEL
Anion gap: 11 (ref 5–15)
BUN: 25 mg/dL — ABNORMAL HIGH (ref 6–20)
CO2: 30 mmol/L (ref 22–32)
Calcium: 9.2 mg/dL (ref 8.9–10.3)
Chloride: 98 mmol/L — ABNORMAL LOW (ref 101–111)
Creatinine, Ser: 0.64 mg/dL (ref 0.44–1.00)
GFR calc Af Amer: >60 mL/min (ref >60)
GFR calc non Af Amer: >60 mL/min (ref >60)
Glucose, Bld: 116 mg/dL — ABNORMAL HIGH (ref 65–99)
Potassium: 4.8 mmol/L (ref 3.5–5.1)
Sodium: 139 mmol/L (ref 135–145)

## 2015-04-25 LAB — CBC
HCT: 49.3 % — ABNORMAL HIGH (ref 36.0–46.0)
Hemoglobin: 16.3 g/dL — ABNORMAL HIGH (ref 12.0–15.0)
MCH: 32.3 pg (ref 26.0–34.0)
MCHC: 33.1 g/dL (ref 30.0–36.0)
MCV: 97.8 fL (ref 78.0–100.0)
Platelets: 111 10*3/uL — ABNORMAL LOW (ref 150–400)
RBC: 5.04 MIL/uL (ref 3.87–5.11)
RDW: 13.6 % (ref 11.5–15.5)
WBC: 7 10*3/uL (ref 4.0–10.5)

## 2015-04-25 LAB — HEMOGLOBIN A1C
Hgb A1c MFr Bld: 5.4 % (ref 4.8–5.6)
Mean Plasma Glucose: 108 mg/dL

## 2015-04-25 MED ORDER — LEVOFLOXACIN IN D5W 750 MG/150ML IV SOLN
750.0000 mg | INTRAVENOUS | Status: AC
  Administered 2015-04-25 – 2015-04-29 (×5): 750 mg via INTRAVENOUS
  Filled 2015-04-25 (×6): qty 150

## 2015-04-25 MED ORDER — PROPOFOL 1000 MG/100ML IV EMUL
INTRAVENOUS | Status: AC
  Administered 2015-04-25: 5 ug/kg/min via INTRAVENOUS
  Filled 2015-04-25: qty 100

## 2015-04-25 MED ORDER — SODIUM CHLORIDE 0.9 % IV SOLN
25.0000 ug/h | INTRAVENOUS | Status: DC
  Administered 2015-04-25: 50 ug/h via INTRAVENOUS
  Administered 2015-04-26: 200 ug/h via INTRAVENOUS
  Administered 2015-04-27: 100 ug/h via INTRAVENOUS
  Administered 2015-04-27: 300 ug/h via INTRAVENOUS
  Administered 2015-04-28: 200 ug/h via INTRAVENOUS
  Filled 2015-04-25 (×5): qty 50

## 2015-04-25 MED ORDER — SODIUM CHLORIDE 0.9 % IV SOLN
INTRAVENOUS | Status: DC
  Administered 2015-04-25 – 2015-04-26 (×2): via INTRAVENOUS
  Administered 2015-04-26 – 2015-04-27 (×2): 75 mL/h via INTRAVENOUS

## 2015-04-25 MED ORDER — FENTANYL CITRATE (PF) 100 MCG/2ML IJ SOLN
INTRAMUSCULAR | Status: AC
  Administered 2015-04-25: 100 ug via INTRAVENOUS
  Filled 2015-04-25: qty 2

## 2015-04-25 MED ORDER — ETOMIDATE 2 MG/ML IV SOLN
20.0000 mg | INTRAVENOUS | Status: AC
  Administered 2015-04-25: 20 mg via INTRAVENOUS

## 2015-04-25 MED ORDER — MIDAZOLAM HCL 2 MG/2ML IJ SOLN
INTRAMUSCULAR | Status: AC
  Administered 2015-04-25: 2 mg via INTRAVENOUS
  Filled 2015-04-25: qty 2

## 2015-04-25 MED ORDER — PROPOFOL 1000 MG/100ML IV EMUL
5.0000 ug/kg/min | INTRAVENOUS | Status: DC
  Administered 2015-04-25: 5 ug/kg/min via INTRAVENOUS
  Administered 2015-04-26: 25 ug/kg/min via INTRAVENOUS
  Administered 2015-04-26: 30 ug/kg/min via INTRAVENOUS
  Administered 2015-04-27: 20 ug/kg/min via INTRAVENOUS
  Administered 2015-04-28: 30 ug/kg/min via INTRAVENOUS
  Filled 2015-04-25 (×4): qty 100

## 2015-04-25 MED ORDER — FENTANYL CITRATE (PF) 100 MCG/2ML IJ SOLN
INTRAMUSCULAR | Status: AC
  Filled 2015-04-25: qty 2

## 2015-04-25 MED ORDER — ANTISEPTIC ORAL RINSE SOLUTION (CORINZ)
7.0000 mL | OROMUCOSAL | Status: DC
  Administered 2015-04-25 – 2015-04-26 (×12): 7 mL via OROMUCOSAL

## 2015-04-25 MED ORDER — FENTANYL CITRATE (PF) 100 MCG/2ML IJ SOLN
100.0000 ug | Freq: Once | INTRAMUSCULAR | Status: AC
  Administered 2015-04-25: 100 ug via INTRAVENOUS
  Filled 2015-04-25: qty 2

## 2015-04-25 MED ORDER — ROCURONIUM BROMIDE 50 MG/5ML IV SOLN
50.0000 mg | INTRAVENOUS | Status: AC
  Administered 2015-04-25: 50 mg via INTRAVENOUS

## 2015-04-25 MED ORDER — CHLORHEXIDINE GLUCONATE 0.12% ORAL RINSE (MEDLINE KIT)
15.0000 mL | Freq: Two times a day (BID) | OROMUCOSAL | Status: DC
  Administered 2015-04-25 – 2015-04-28 (×7): 15 mL via OROMUCOSAL

## 2015-04-25 MED ORDER — PANTOPRAZOLE SODIUM 40 MG IV SOLR
40.0000 mg | INTRAVENOUS | Status: DC
Start: 2015-04-25 — End: 2015-04-29
  Administered 2015-04-25 – 2015-04-28 (×4): 40 mg via INTRAVENOUS
  Filled 2015-04-25 (×5): qty 40

## 2015-04-25 MED ORDER — MIDAZOLAM HCL 2 MG/2ML IJ SOLN
INTRAMUSCULAR | Status: AC
  Filled 2015-04-25: qty 2

## 2015-04-25 MED ORDER — MIDAZOLAM HCL 2 MG/2ML IJ SOLN
2.0000 mg | Freq: Once | INTRAMUSCULAR | Status: DC
Start: 2015-04-25 — End: 2015-04-28

## 2015-04-25 MED ORDER — MIDAZOLAM HCL 2 MG/2ML IJ SOLN
1.0000 mg | INTRAMUSCULAR | Status: DC | PRN
  Administered 2015-04-25 – 2015-04-27 (×6): 2 mg via INTRAVENOUS
  Filled 2015-04-25 (×4): qty 2

## 2015-04-25 NOTE — Progress Notes (Signed)
Pt pulled mask off prior to my arrival in the room. Came to assess to see if could leave off mask and give pt a break from NIV. Pt immediately desat and began to have pulling rapid respirations. Pt was huffing and stated that she couldn't breath once off the mask .RN witness breathing pattern. Pt was placed back on the mask and o2 saturation began to come back stable. Pt tolerates it fairly well. Pt wants the mask off. RT encourage mask to wear it because its helping her breathing. Pt is stable at this time no complications noted

## 2015-04-25 NOTE — Progress Notes (Signed)
Patient taken off of Bipap and placed on 4L nasal cannula.  Patient is currently tolerating well.  Will continue to monitor.

## 2015-04-25 NOTE — Consult Note (Signed)
PULMONARY / CRITICAL CARE MEDICINE   Name: Wanda Prince MRN: [id number] DOB: 04-02-1956    ADMISSION DATE:  04/22/2015 CONSULTATION DATE:  04/24/15  REFERRING MD:  Dr. Beryle Beams  CHIEF COMPLAINT:  Altered mental status, Hypercarbic respiratory failure   HISTORY OF PRESENT ILLNESS:   59 y/o F, smoker, with PMH of meningitis, VP shunt, seizure disorder, ETOH abuse / polysubstance abuse, depression with prior suicide attempts, IDA, and COPD with prior respiratory arrest & trach who presented to [facility] on 12/24 with shortness of breath.    The patient activated EMS for SOB.  Room air saturations 87% on EMS arrival and treated with albuterol / '125mg'$  IV solu-medrol.  She reported non-productive cough.  In the ER, she was treated with a one hour albuterol treatment with improvement in symptoms.  Initial CXR consistent with emphysema but no acute infiltrate.  Labs - Na 140, K 3.8, Sr cr 0.50, WBC 17.5, and platelets 136.  ABG on 12/25 7.43 / 43 / 78 / 28.  She was admitted by IMTS for COPD exacerbation.  The patient became agitated overnight on 12/25-26 with concerns for ETOH withdrawal and was treated with CIWA protocol.  Subsequently, she became more somnolent.   Follow up ABG assessed 12/26 7.211 / 87 / 117 / 33.7.  The patient was placed on BiPAP with improvement to 7.27 / 73 / 92 / 33.  PCCM consulted for acute on chronic respiratory failure.    The patient is altered and unable to participate in ROS.  RN reports pt was tripoding early this am and appeared restless / air hungry.   SUBJECTIVE:  Patient is very encephalopathic and not tolerating BiPAP, very agitated.  VITAL SIGNS: BP 113/95 mmHg  Pulse 107  Temp(Src) 97.5 F (36.4 C) (Oral)  Resp 24  Ht '5\' 8"'$  (1.727 m)  Wt 51.438 kg (113 lb 6.4 oz)  BMI 17.25 kg/m2  SpO2 97%  HEMODYNAMICS:    VENTILATOR SETTINGS: Vent Mode:  [-]  FiO2 (%):  [30 %-50 %] 40 %  INTAKE / OUTPUT: I/O last 3 completed shifts: In: 271.6 [I.V.:166.6; IV  Piggyback:105] Out: 560 [Urine:560]  PHYSICAL EXAMINATION: General:  Chronically ill appearing female on and off bipap, appears older than stated age. Neuro:  Somnolent, arouses to follow commands but confused. HEENT:  Mask in place, no jvd, old trach scar. Cardiovascular:  s1s2 rrr, no m/r/g, tachy. Lungs:  Tachypnea, diffuse wheeze bilaterally  Abdomen:  Non-distended, soft, bsx4 active  Musculoskeletal:  No acute deformities  Skin:  Thin, scattered bruises  LABS:  BMET  Recent Labs Lab 04/22/15 1143 04/25/15 0230  NA 140 139  K 3.8 4.8  CL 103 98*  CO2  --  30  BUN 6 25*  CREATININE 0.50 0.64  GLUCOSE 117* 116*   Electrolytes  Recent Labs Lab 04/25/15 0230  CALCIUM 9.2   CBC  Recent Labs Lab 04/22/15 1134 04/22/15 1143 04/23/15 0400 04/25/15 0230  WBC 8.6  --  17.5* 7.0  HGB 15.2* 17.0* 14.1 16.3*  HCT 46.1* 50.0* 42.0 49.3*  PLT 110*  --  136* 111*   Coag's No results for input(s): APTT, INR in the last 168 hours.  Sepsis Markers No results for input(s): LATICACIDVEN, PROCALCITON, O2SATVEN in the last 168 hours.  ABG  Recent Labs Lab 04/24/15 1130 04/24/15 1620 04/25/15 0511  PHART 7.278* 7.311* 7.410  PCO2ART 73.7* 64.5* 42.2  PO2ART 92.5 76.0* 64.4*   Liver Enzymes No results for input(s): AST, ALT,  ALKPHOS, BILITOT, ALBUMIN in the last 168 hours.  Cardiac Enzymes No results for input(s): TROPONINI, PROBNP in the last 168 hours.  Glucose  Recent Labs Lab 04/24/15 1522  GLUCAP 87    Imaging No results found.  STUDIES:  CXR I reviewed myself, hyperinflation.  CULTURES: None  ANTIBIOTICS: None  SIGNIFICANT EVENTS: 12/24  Admit with AECOPD 12/25  Agitation, CIWA 12/26  Hypercarbic respiratory failure on bipap  LINES/TUBES: PIV  DISCUSSION: 59 y/o F, current smoker, with PMH of COPD, prior tracheostomy who presented to [facility] on 12/24 with AECOPD.  Admitted per IMTS, developed agitated delirium and treated with CIWA  protocol out of concern for ETOH withdrawal.  Developed worsening hypercarbic respiratory failure, treated with BiPAP & PCCM consulted 12/26.   ASSESSMENT / PLAN:  PULMONARY A: Acute Hypercarbic Respiratory Failure  Acute Exacerbation of COPD  Tobacco Abuse  Hx Prior Tracheostomy / respiratory arrest  P:   D/C BiPAP, patient is not tolerating it. Hopeful to avoid intubation.  Would be very difficult to liberate from vent.  Spoke with sister over the phone, she is on the way in and would like to discuss this face to face.  Full code for now. Intermittent CXR. Continue IV steroids, 60 mg BID Continue budesonide, adjust dosing to 0.5 BID Add brovana BID  Adjust duonebs to Q6, hopeful to make PRN in the next few days Abx as below. Nicotine patch  CARDIOVASCULAR A:  Tachycardia  P:  Monitor hemodynamics in ICU Tele monitoring  ASA QD  RENAL A:   No acute issues  P:   Trend BMP / UOP  Replace electrolytes as indicated  0.9 NS at 75 ml/hr.  GASTROINTESTINAL A:   At Risk For Aspiration  P:   NPO while on BiPAP. TF if intubated.  HEMATOLOGIC A:   Thrombocytopenia - mild  P:  Trend CBC. Monitor for evidence of bleeding. Transfuse per ICU protocol.  INFECTIOUS A:   AECOPD  P:   Monitor fever curve / WBC. Start levaquin for airway sterilization.  ENDOCRINE A:   No acute issues  P:   Monitor glucose on BMP   NEUROLOGIC A:   Agitated Delirium - concern for steroid induced Hx Polysubstance Abuse  Concern for ETOH withdrawal  P:   RASS goal: 3 Precedex if needed for agitation D/C CIWA / ativan since on precedex. Minimize sedation as able Continue thiamine / folate / MVI  FAMILY  - Updates: Sister updated over the phone, she is on her way in to discuss code status.  Will recommend DNR.  - Inter-disciplinary family meet or Palliative Care meeting due by:   04/30/15  The patient is critically ill with multiple organ systems failure and requires high  complexity decision making for assessment and support, frequent evaluation and titration of therapies, application of advanced monitoring technologies and extensive interpretation of multiple databases.   Critical Care Time devoted to patient care services described in this note is  35  Minutes. This time reflects time of care of this signee Dr [name]. This critical care time does not reflect procedure time, or teaching time or supervisory time of PA/NP/Med student/Med Resident etc but could involve care discussion time.  Rush Farmer, M.D. [facility] Pulmonary/Critical Care Medicine. Pager: [phone]. After hours pager: [phone].

## 2015-04-25 NOTE — Progress Notes (Deleted)
Came to assess pt, RN stated that she suction pt and the vent started to alarm uncontrollably . On my assessment I noted pt was air trapping/auto peeping and stacking breaths. Pt ETT was briefly removed from hub to allow pt to exhale her breaths. I noted minimal change in her auto-peeping. Expiratory cassette and filter was removed and replaced from the ventilator and after which that was done pt was not breath stacking any longer. In the event no change in pt clinical status or o2 saturations. Pt was stable throughout. No complications or distress noted. RN aware and at bedside.

## 2015-04-25 NOTE — Progress Notes (Signed)
[place] Progress Note Patient Name: Wanda Prince DOB: 18-Nov-1955 MRN: [id number]   Date of Service  04/25/2015  HPI/Events of Note  Patient with severe agitation, high risk for self extubation  eICU Interventions  Will stop precedex, start propofol Add restraints as needed        Flora Lipps 04/25/2015, 9:33 PM

## 2015-04-25 NOTE — Progress Notes (Signed)
abg collected

## 2015-04-25 NOTE — Progress Notes (Signed)
Came to assess pt, RN stated that she suction pt and the vent started to alarm uncontrollably . On my assessment I noted pt was air trapping/auto peeping and stacking breaths. Pt ETT was briefly removed from hub to allow pt to exhale her breaths. I noted minimal change in her auto-peeping. Expiratory cassette and filter was removed and replaced from the ventilator and after which that was done pt was not breath stacking any longer. In the event no change in pt clinical status or o2 saturations. Pt was stable throughout. No complications or distress noted. RN aware and at bedside.

## 2015-04-26 ENCOUNTER — Inpatient Hospital Stay (HOSPITAL_COMMUNITY): Payer: Medicaid Other

## 2015-04-26 DIAGNOSIS — J9602 Acute respiratory failure with hypercapnia: Secondary | ICD-10-CM

## 2015-04-26 DIAGNOSIS — J9601 Acute respiratory failure with hypoxia: Secondary | ICD-10-CM | POA: Insufficient documentation

## 2015-04-26 LAB — BLOOD GAS, ARTERIAL
Acid-Base Excess: 4.3 mmol/L — ABNORMAL HIGH (ref 0.0–2.0)
Acid-Base Excess: 3.3 mmol/L — ABNORMAL HIGH (ref 0.0–2.0)
Bicarbonate: 29.7 mEq/L — ABNORMAL HIGH (ref 20.0–24.0)
Bicarbonate: 28.1 mEq/L — ABNORMAL HIGH (ref 20.0–24.0)
Drawn by: 301361
Drawn by: 44166
FIO2: 1
FIO2: 0.4
MECHVT: 450 mL
MECHVT: 510 mL
O2 Saturation: 99.8 %
O2 Saturation: 95.8 %
PEEP: 5 cmH2O
PEEP: 5 cmH2O
Patient temperature: 98.6
Patient temperature: 98.6
RATE: 12 resp/min
RATE: 12 resp/min
TCO2: 31.5 mmol/L (ref 0–100)
TCO2: 29.6 mmol/L (ref 0–100)
pCO2 arterial: 56.7 mmHg — ABNORMAL HIGH (ref 35.0–45.0)
pCO2 arterial: 48.7 mmHg — ABNORMAL HIGH (ref 35.0–45.0)
pH, Arterial: 7.34 — ABNORMAL LOW (ref 7.350–7.450)
pH, Arterial: 7.38 (ref 7.350–7.450)
pO2, Arterial: 490 mmHg — ABNORMAL HIGH (ref 80.0–100.0)
pO2, Arterial: 84.4 mmHg (ref 80.0–100.0)

## 2015-04-26 LAB — CBC
HCT: 40.7 % (ref 36.0–46.0)
Hemoglobin: 13.1 g/dL (ref 12.0–15.0)
MCH: 31.1 pg (ref 26.0–34.0)
MCHC: 32.2 g/dL (ref 30.0–36.0)
MCV: 96.7 fL (ref 78.0–100.0)
Platelets: 103 10*3/uL — ABNORMAL LOW (ref 150–400)
RBC: 4.21 MIL/uL (ref 3.87–5.11)
RDW: 13.4 % (ref 11.5–15.5)
WBC: 8.4 10*3/uL (ref 4.0–10.5)

## 2015-04-26 LAB — PHOSPHORUS: Phosphorus: 3 mg/dL (ref 2.5–4.6)

## 2015-04-26 LAB — BASIC METABOLIC PANEL
Anion gap: 8 (ref 5–15)
BUN: 31 mg/dL — ABNORMAL HIGH (ref 6–20)
CO2: 28 mmol/L (ref 22–32)
Calcium: 8.1 mg/dL — ABNORMAL LOW (ref 8.9–10.3)
Chloride: 105 mmol/L (ref 101–111)
Creatinine, Ser: 0.5 mg/dL (ref 0.44–1.00)
GFR calc Af Amer: >60 mL/min (ref >60)
GFR calc non Af Amer: >60 mL/min (ref >60)
Glucose, Bld: 125 mg/dL — ABNORMAL HIGH (ref 65–99)
Potassium: 4.2 mmol/L (ref 3.5–5.1)
Sodium: 141 mmol/L (ref 135–145)

## 2015-04-26 LAB — MAGNESIUM: Magnesium: 2.1 mg/dL (ref 1.7–2.4)

## 2015-04-26 MED ORDER — MORPHINE SULFATE (PF) 2 MG/ML IV SOLN: 1.0000 mg | Freq: Once | INTRAVENOUS | Status: DC

## 2015-04-26 MED ORDER — ROCURONIUM BROMIDE 50 MG/5ML IV SOLN
50.0000 mg | Freq: Once | INTRAVENOUS | Status: AC
  Administered 2015-04-26: 50 mg via INTRAVENOUS
  Filled 2015-04-26: qty 5

## 2015-04-26 MED ORDER — PRO-STAT SUGAR FREE PO LIQD
30.0000 mL | Freq: Two times a day (BID) | ORAL | Status: AC
  Administered 2015-04-26 (×2): 30 mL
  Filled 2015-04-26 (×2): qty 30

## 2015-04-26 MED ORDER — FENTANYL CITRATE (PF) 100 MCG/2ML IJ SOLN
100.0000 ug | Freq: Once | INTRAMUSCULAR | Status: AC
  Administered 2015-04-26: 100 ug via INTRAVENOUS

## 2015-04-26 MED ORDER — CHLORHEXIDINE GLUCONATE 0.12% ORAL RINSE (MEDLINE KIT): 15.0000 mL | Freq: Two times a day (BID) | OROMUCOSAL | Status: DC

## 2015-04-26 MED ORDER — B COMPLEX-C PO TABS
1.0000 | ORAL_TABLET | Freq: Two times a day (BID) | ORAL | Status: DC
  Administered 2015-04-26 – 2015-05-07 (×21): 1 via ORAL
  Filled 2015-04-26 (×27): qty 1

## 2015-04-26 MED ORDER — ETOMIDATE 2 MG/ML IV SOLN
20.0000 mg | Freq: Once | INTRAVENOUS | Status: AC
  Administered 2015-04-26: 20 mg via INTRAVENOUS

## 2015-04-26 MED ORDER — VITAL HIGH PROTEIN PO LIQD
1000.0000 mL | ORAL | Status: DC
  Administered 2015-04-26 – 2015-04-27 (×2): 1000 mL
  Filled 2015-04-26 (×4): qty 1000

## 2015-04-26 MED ORDER — ANTISEPTIC ORAL RINSE SOLUTION (CORINZ)
7.0000 mL | Freq: Four times a day (QID) | OROMUCOSAL | Status: DC
  Administered 2015-04-27 – 2015-04-28 (×9): 7 mL via OROMUCOSAL

## 2015-04-26 NOTE — Procedures (Signed)
Intubation Procedure Note Wanda Prince [id number] 13-Sep-1955  Procedure: Intubation Indications: Respiratory insufficiency  Procedure Details Consent: Unable to obtain consent because of emergent medical necessity. Time Out: Verified patient identification, verified procedure, site/side was marked, verified correct patient position, special equipment/implants available, medications/allergies/relevent history reviewed, required imaging and test results available.  Performed  Maximum sterile technique was used including gloves, hand hygiene and mask.  MAC    Evaluation Hemodynamic Status: BP stable throughout; O2 sats: stable throughout Patient's Current Condition: stable Complications: No apparent complications Patient did tolerate procedure well. Chest X-ray ordered to verify placement.  CXR: pending.   Wanda Prince 04/26/2015

## 2015-04-26 NOTE — Progress Notes (Addendum)
Initial Nutrition Assessment  DOCUMENTATION CODES:   Underweight  INTERVENTION:    Initiate TF via OGT with Vital High Protein at 25 ml/h and Prostat 30 ml BID on day 1; on day 2, increase to goal rate of 45 ml/h (1080 ml per day) to provide 1080 kcals, 95 gm protein, 903 ml free water daily.  NUTRITION DIAGNOSIS:   Inadequate oral intake related to inability to eat as evidenced by NPO status.  GOAL:   Patient will meet greater than or equal to 90% of their needs  MONITOR:   Vent status, Labs, Weight trends, TF tolerance, I & O's  REASON FOR ASSESSMENT:   Consult Enteral/tube feeding initiation and management  ASSESSMENT:   59 year old female with a past medical history of COPD, active tobacco abuse, ETOH and cocaine abuse, presents with worsening shortness of breath. Required intubation on 12/27.  Discussed patient in with CCM and RN today. RD to order TF. Nutrition-Focused physical exam completed. Findings are no fat depletion, severe muscle depletion, and no edema. Patient is underweight with BMI = 17.25. Patient unable to provide any hx, however, suspect intake PTA was inadequate given polysubstance abuse.   Patient is currently intubated on ventilator support MV: 5.7 L/min Temp (24hrs), Avg:97.7 F (36.5 C), Min:97.4 F (36.3 C), Max:98.7 F (37.1 C)  Propofol: 6.2 ml/hr providing 164 kcals per day.    Diet Order:  Diet NPO time specified  Skin:  Reviewed, no issues  Last BM:  unknown  Height:   Ht Readings from Last 1 Encounters:  04/25/15 '5\' 8"'$  (1.727 m)    Weight:   Wt Readings from Last 1 Encounters:  04/22/15 113 lb 6.4 oz (51.438 kg)    Ideal Body Weight:  63.6 kg  BMI:  Body mass index is 17.25 kg/(m^2).  Estimated Nutritional Needs:   Kcal:  1266  Protein:  80-90 gm  Fluid:  1.5 L  EDUCATION NEEDS:   No education needs identified at this time  Molli Barrows, [place], [place], [place] Pager [phone] After Hours Pager [phone]

## 2015-04-26 NOTE — Progress Notes (Signed)
PULMONARY / CRITICAL CARE MEDICINE   Name: Wanda Prince MRN: [id number] DOB: 02-17-1956    ADMISSION DATE:  04/22/2015 CONSULTATION DATE:  04/24/15  REFERRING MD:  Dr. Beryle Beams  CHIEF COMPLAINT:  Altered mental status, Hypercarbic respiratory failure   HISTORY OF PRESENT ILLNESS:   59 y/o F, smoker, with PMH of meningitis, VP shunt, seizure disorder, ETOH abuse / polysubstance abuse, depression with prior suicide attempts, IDA, and COPD with prior respiratory arrest & trach who presented to [facility] on 12/24 with shortness of breath.    The patient activated EMS for SOB.  Room air saturations 87% on EMS arrival and treated with albuterol / '125mg'$  IV solu-medrol.  She reported non-productive cough.  In the ER, she was treated with a one hour albuterol treatment with improvement in symptoms.  Initial CXR consistent with emphysema but no acute infiltrate.  Labs - Na 140, K 3.8, Sr cr 0.50, WBC 17.5, and platelets 136.  ABG on 12/25 7.43 / 43 / 78 / 28.  She was admitted by IMTS for COPD exacerbation.  The patient became agitated overnight on 12/25-26 with concerns for ETOH withdrawal and was treated with CIWA protocol.  Subsequently, she became more somnolent.   Follow up ABG assessed 12/26 7.211 / 87 / 117 / 33.7.  The patient was placed on BiPAP with improvement to 7.27 / 73 / 92 / 33.  PCCM consulted for acute on chronic respiratory failure.    The patient is altered and unable to participate in ROS.  RN reports pt was tripoding early this am and appeared restless / air hungry.   SUBJECTIVE:  Self extubated overnight.  VITAL SIGNS: BP 158/42 mmHg  Pulse 108  Temp(Src) 98.7 F (37.1 C) (Oral)  Resp 12  Ht '5\' 8"'$  (1.727 m)  Wt 51.438 kg (113 lb 6.4 oz)  BMI 17.25 kg/m2  SpO2 100%  HEMODYNAMICS:    VENTILATOR SETTINGS: Vent Mode:  [-] PRVC FiO2 (%):  [40 %-100 %] 40 % Set Rate:  [12 bmp-14 bmp] 12 bmp Vt Set:  [450 mL-510 mL] 510 mL PEEP:  [5 cmH20-8 cmH20] 5 cmH20 Pressure  Support:  [8 cmH20] 8 cmH20 Plateau Pressure:  [12 cmH20-16 cmH20] 12 cmH20  INTAKE / OUTPUT: I/O last 3 completed shifts: In: 2391.8 [I.V.:1926.8; IV Piggyback:465] Out: 4765 [YYTKP:5465]  PHYSICAL EXAMINATION: General:  Chronically ill appearing female self extubated and in respiratory failure. Neuro:  Obtunded in respiratory failure post extubation. HEENT:  Mask in place, no jvd, old trach scar. Cardiovascular:  s1s2 rrr, no m/r/g, tachy. Lungs:  Tachypnea, diffuse wheeze bilaterally  Abdomen:  Non-distended, soft, bsx4 active  Musculoskeletal:  No acute deformities  Skin:  Thin, scattered bruises  LABS:  BMET  Recent Labs Lab 04/22/15 1143 04/25/15 0230 04/26/15 0425  NA 140 139 141  K 3.8 4.8 4.2  CL 103 98* 105  CO2  --  30 28  BUN 6 25* 31*  CREATININE 0.50 0.64 0.50  GLUCOSE 117* 116* 125*   Electrolytes  Recent Labs Lab 04/25/15 0230 04/26/15 0425  CALCIUM 9.2 8.1*  MG  --  2.1  PHOS  --  3.0   CBC  Recent Labs Lab 04/23/15 0400 04/25/15 0230 04/26/15 0425  WBC 17.5* 7.0 8.4  HGB 14.1 16.3* 13.1  HCT 42.0 49.3* 40.7  PLT 136* 111* 103*   Coag's No results for input(s): APTT, INR in the last 168 hours.  Sepsis Markers No results for input(s): LATICACIDVEN, PROCALCITON, O2SATVEN in  the last 168 hours.  ABG  Recent Labs Lab 04/25/15 0511 04/25/15 1305 04/26/15 0355  PHART 7.410 7.340* 7.380  PCO2ART 42.2 56.7* 48.7*  PO2ART 64.4* 490* 84.4   Liver Enzymes No results for input(s): AST, ALT, ALKPHOS, BILITOT, ALBUMIN in the last 168 hours.  Cardiac Enzymes No results for input(s): TROPONINI, PROBNP in the last 168 hours.  Glucose  Recent Labs Lab 04/24/15 1522  GLUCAP 87   Imaging Dg Abd 1 View  04/25/2015  CLINICAL DATA:  Orogastric tube placement. EXAM: ABDOMEN - 1 VIEW COMPARISON:  February 28, 2012. FINDINGS: Distal tip of feeding tube is seen in expected position of distal stomach. Stool is noted in the right colon.  Right-sided ventriculoperitoneal shunt is again noted. No abnormal bowel gas pattern is noted. IMPRESSION: Distal tip of feeding tube seen in expected position of distal stomach. Electronically Signed   By: Marijo Conception, M.D.   On: 04/25/2015 13:48   Dg Chest Port 1 View  04/26/2015  CLINICAL DATA:  Hypoxia EXAM: PORTABLE CHEST 1 VIEW COMPARISON:  April 25, 2015 FINDINGS: Endotracheal tube tip is 6.1 cm above the carina. Nasogastric tube tip and side port are below the diaphragm. Central catheter tip is in the superior vena cava. There is no demonstrable pneumothorax. There is no appreciable edema or consolidation. The heart size and pulmonary vascularity are normal. No adenopathy. IMPRESSION: Tube and catheter positions as described without pneumothorax. No edema or consolidation. Electronically Signed   By: Lowella Grip III M.D.   On: 04/26/2015 07:19   Dg Chest Port 1 View  04/25/2015  CLINICAL DATA:  Central line placement, meningitis, ventricular shunt, seizure disorder, COPD EXAM: PORTABLE CHEST 1 VIEW COMPARISON:  Portable exam 1222 hours compared to 04/23/2015 FINDINGS: Tip of endotracheal tube projects 3.8 cm above carina. RIGHT subclavian central venous catheter tip projects over SVC. Shunt tubing traverses RIGHT hemi thorax. Normal heart size, mediastinal contours, and pulmonary vascularity. Atherosclerotic calcification aorta. Lungs emphysematous with minimal atelectasis at RIGHT base. No additional infiltrate, pleural effusion or pneumothorax. Osseous structures stable. IMPRESSION: No pneumothorax following RIGHT subclavian line placement. Satisfactory endotracheal tube position. COPD changes with minimal RIGHT basilar atelectasis. Electronically Signed   By: Lavonia Dana M.D.   On: 04/25/2015 12:31   STUDIES:  CXR I reviewed myself, hyperinflation.  CULTURES: None  ANTIBIOTICS: None  SIGNIFICANT EVENTS: 12/24  Admit with AECOPD 12/25  Agitation, CIWA 12/26  Hypercarbic  respiratory failure on bipap  LINES/TUBES: R  TLC 12/27>>>  DISCUSSION: 59 y/o F, current smoker, with PMH of COPD, prior tracheostomy who presented to [facility] on 12/24 with AECOPD.  Admitted per IMTS, developed agitated delirium and treated with CIWA protocol out of concern for ETOH withdrawal.  Developed worsening hypercarbic respiratory failure, treated with BiPAP & PCCM consulted 12/26.   ASSESSMENT / PLAN:  PULMONARY A: Acute Hypercarbic Respiratory Failure  Acute Exacerbation of COPD  Tobacco Abuse  Hx Prior Tracheostomy / respiratory arrest  P:   Reintubate. Full vent support. Will likely need trach again. Daily CXR. Continue IV steroids, 60 mg BID Continue budesonide, adjust dosing to 0.5 BID Continue brovana BID  Adjusted duonebs to Q6, hopeful to make PRN in the next few days Abx as below. Nicotine patch  CARDIOVASCULAR A:  Tachycardia  P:  Monitor hemodynamics in ICU Tele monitoring  ASA QD  RENAL A:   No acute issues  P:   Trend BMP / UOP  Replace electrolytes as indicated  0.9 NS  at 75 ml/hr.  GASTROINTESTINAL A:   At Risk For Aspiration  P:   PPI. TF intubated.  HEMATOLOGIC A:   Thrombocytopenia - mild  P:  Trend CBC. Monitor for evidence of bleeding. Transfuse per ICU protocol.  INFECTIOUS A:   AECOPD  P:   Monitor fever curve / WBC. Continue levaquin for airway sterilization.  ENDOCRINE A:   No acute issues  P:   Monitor glucose on BMP.  NEUROLOGIC A:   Agitated Delirium - concern for steroid induced Hx Polysubstance Abuse  Concern for ETOH withdrawal  P:   RASS goal: 3 Propofol and fentanyl for sedation. Continue thiamine / folate / MVI  FAMILY  - Updates: Full code status.  Reintubate.  Full vent support.  - Inter-disciplinary family meet or Palliative Care meeting due by:   04/30/15  The patient is critically ill with multiple organ systems failure and requires high complexity decision making for assessment and  support, frequent evaluation and titration of therapies, application of advanced monitoring technologies and extensive interpretation of multiple databases.   Critical Care Time devoted to patient care services described in this note is  35  Minutes. This time reflects time of care of this signee Dr [name]. This critical care time does not reflect procedure time, or teaching time or supervisory time of PA/NP/Med student/Med Resident etc but could involve care discussion time.  Rush Farmer, M.D. [facility] Pulmonary/Critical Care Medicine. Pager: [phone]. After hours pager: [phone].

## 2015-04-26 NOTE — Progress Notes (Signed)
Patient self extubated and was placed on a NRB until RT could get patient setup on bi-pap. MD was notified. RT will continue to monitor.

## 2015-04-26 NOTE — Progress Notes (Signed)
abg collected

## 2015-04-26 NOTE — Progress Notes (Signed)
eLink Physician-Brief Progress Note Patient Name: Wanda Prince DOB: 1956-02-09 MRN: [id number]   Date of Service  04/26/2015  HPI/Events of Note  Request to renew restraint order.  eICU Interventions  Will renew restraint order.     Intervention Category Minor Interventions: Agitation / anxiety - evaluation and management  Lysle Dingwall 04/26/2015, 8:51 PM

## 2015-04-26 NOTE — Progress Notes (Signed)
Patient was agitated this morning and self extubated although she was on sedation and restrain. I had cut by half the sedation this morning for a wake up assessment  And weaning. Patient was weaning for About 1 hrs without any distress and suddenly  I was called from another room that patient had self extubated. Patient was immediately placed on BiPAP, since she appeared to be in distress and MD notified . MD rushed to bedside and re intubated the patient. Continue to monitor.

## 2015-04-27 ENCOUNTER — Inpatient Hospital Stay (HOSPITAL_COMMUNITY): Payer: Medicaid Other

## 2015-04-27 LAB — BASIC METABOLIC PANEL
Anion gap: 5 (ref 5–15)
BUN: 22 mg/dL — ABNORMAL HIGH (ref 6–20)
CO2: 29 mmol/L (ref 22–32)
Calcium: 8.2 mg/dL — ABNORMAL LOW (ref 8.9–10.3)
Chloride: 108 mmol/L (ref 101–111)
Creatinine, Ser: 0.47 mg/dL (ref 0.44–1.00)
GFR calc Af Amer: >60 mL/min (ref >60)
GFR calc non Af Amer: >60 mL/min (ref >60)
Glucose, Bld: 142 mg/dL — ABNORMAL HIGH (ref 65–99)
Potassium: 4.6 mmol/L (ref 3.5–5.1)
Sodium: 142 mmol/L (ref 135–145)

## 2015-04-27 LAB — BLOOD GAS, ARTERIAL
Acid-Base Excess: 2.8 mmol/L — ABNORMAL HIGH (ref 0.0–2.0)
Bicarbonate: 28 mEq/L — ABNORMAL HIGH (ref 20.0–24.0)
Drawn by: 27022
FIO2: 0.4
MECHVT: 510 mL
O2 Saturation: 93.9 %
PEEP: 5 cmH2O
Patient temperature: 98.6
RATE: 12 resp/min
TCO2: 29.6 mmol/L (ref 0–100)
pCO2 arterial: 52.9 mmHg — ABNORMAL HIGH (ref 35.0–45.0)
pH, Arterial: 7.343 — ABNORMAL LOW (ref 7.350–7.450)
pO2, Arterial: 77.6 mmHg — ABNORMAL LOW (ref 80.0–100.0)

## 2015-04-27 LAB — CBC
HCT: 38.5 % (ref 36.0–46.0)
Hemoglobin: 12.3 g/dL (ref 12.0–15.0)
MCH: 31.5 pg (ref 26.0–34.0)
MCHC: 31.9 g/dL (ref 30.0–36.0)
MCV: 98.7 fL (ref 78.0–100.0)
Platelets: 95 10*3/uL — ABNORMAL LOW (ref 150–400)
RBC: 3.9 MIL/uL (ref 3.87–5.11)
RDW: 13.8 % (ref 11.5–15.5)
WBC: 9.3 10*3/uL (ref 4.0–10.5)

## 2015-04-27 LAB — GLUCOSE, CAPILLARY
Glucose-Capillary: 111 mg/dL — ABNORMAL HIGH (ref 65–99)
Glucose-Capillary: 126 mg/dL — ABNORMAL HIGH (ref 65–99)
Glucose-Capillary: 131 mg/dL — ABNORMAL HIGH (ref 65–99)
Glucose-Capillary: 132 mg/dL — ABNORMAL HIGH (ref 65–99)
Glucose-Capillary: 142 mg/dL — ABNORMAL HIGH (ref 65–99)
Glucose-Capillary: 150 mg/dL — ABNORMAL HIGH (ref 65–99)

## 2015-04-27 LAB — POCT I-STAT 3, ART BLOOD GAS (G3+)
Acid-Base Excess: 6 mmol/L — ABNORMAL HIGH (ref 0.0–2.0)
Bicarbonate: 31.8 mEq/L — ABNORMAL HIGH (ref 20.0–24.0)
O2 Saturation: 97 %
Patient temperature: 98.9
TCO2: 33 mmol/L (ref 0–100)
pCO2 arterial: 51.8 mmHg — ABNORMAL HIGH (ref 35.0–45.0)
pH, Arterial: 7.396 (ref 7.350–7.450)
pO2, Arterial: 97 mmHg (ref 80.0–100.0)

## 2015-04-27 LAB — MAGNESIUM: Magnesium: 2.2 mg/dL (ref 1.7–2.4)

## 2015-04-27 LAB — PHOSPHORUS: Phosphorus: 2.9 mg/dL (ref 2.5–4.6)

## 2015-04-27 MED ORDER — SODIUM CHLORIDE 0.9 % IV SOLN
INTRAVENOUS | Status: DC
  Administered 2015-04-30: 23:00:00 via INTRAVENOUS

## 2015-04-27 MED ORDER — FUROSEMIDE 10 MG/ML IJ SOLN
40.0000 mg | Freq: Four times a day (QID) | INTRAMUSCULAR | Status: AC
  Administered 2015-04-27 (×3): 40 mg via INTRAVENOUS
  Filled 2015-04-27 (×3): qty 4

## 2015-04-27 MED ORDER — DIPHENHYDRAMINE HCL 50 MG/ML IJ SOLN
12.5000 mg | Freq: Four times a day (QID) | INTRAMUSCULAR | Status: DC | PRN
  Administered 2015-04-27: 12.5 mg via INTRAVENOUS
  Filled 2015-04-27: qty 1

## 2015-04-27 MED ORDER — LIDOCAINE VISCOUS 2 % MT SOLN
OROMUCOSAL | Status: AC
  Filled 2015-04-27: qty 15

## 2015-04-27 NOTE — Progress Notes (Signed)
abg collected

## 2015-04-27 NOTE — Progress Notes (Signed)
PULMONARY / CRITICAL CARE MEDICINE   Name: Wanda Prince MRN: [id number] DOB: June 13, 1955    ADMISSION DATE:  04/22/2015 CONSULTATION DATE:  04/24/15  REFERRING MD:  Dr. Beryle Beams  CHIEF COMPLAINT:  Altered mental status, Hypercarbic respiratory failure   HISTORY OF PRESENT ILLNESS:   59 y/o F, smoker, with PMH of meningitis, VP shunt, seizure disorder, ETOH abuse / polysubstance abuse, depression with prior suicide attempts, IDA, and COPD with prior respiratory arrest & trach who presented to [facility] on 12/24 with shortness of breath.    The patient activated EMS for SOB.  Room air saturations 87% on EMS arrival and treated with albuterol / '125mg'$  IV solu-medrol.  She reported non-productive cough.  In the ER, she was treated with a one hour albuterol treatment with improvement in symptoms.  Initial CXR consistent with emphysema but no acute infiltrate.  Labs - Na 140, K 3.8, Sr cr 0.50, WBC 17.5, and platelets 136.  ABG on 12/25 7.43 / 43 / 78 / 28.  She was admitted by IMTS for COPD exacerbation.  The patient became agitated overnight on 12/25-26 with concerns for ETOH withdrawal and was treated with CIWA protocol.  Subsequently, she became more somnolent.   Follow up ABG assessed 12/26 7.211 / 87 / 117 / 33.7.  The patient was placed on BiPAP with improvement to 7.27 / 73 / 92 / 33.  PCCM consulted for acute on chronic respiratory failure.    The patient is altered and unable to participate in ROS.  RN reports pt was tripoding early this am and appeared restless / air hungry.   SUBJECTIVE:  Very agitated this AM and attempted to take ETT out again.  VITAL SIGNS: BP 152/80 mmHg  Pulse 103  Temp(Src) 98.3 F (36.8 C) (Oral)  Resp 12  Ht '5\' 8"'$  (1.727 m)  Wt 54.5 kg (120 lb 2.4 oz)  BMI 18.27 kg/m2  SpO2 100%  HEMODYNAMICS:    VENTILATOR SETTINGS: Vent Mode:  [-] PRVC FiO2 (%):  [40 %] 40 % Set Rate:  [12 bmp] 12 bmp Vt Set:  [510 mL] 510 mL PEEP:  [5 cmH20] 5 cmH20 Plateau  Pressure:  [8 cmH20-15 cmH20] 8 cmH20  INTAKE / OUTPUT: I/O last 3 completed shifts: In: 4141.4 [I.V.:3477.2; NG/GT:349.2; IV Piggyback:315] Out: 1350 [PIRJJ:8841]  PHYSICAL EXAMINATION: General:  Chronically ill appearing female agitated. Neuro:  Agitated and moving all ext to command. HEENT:  Mask in place, no jvd, old trach scar. Cardiovascular:  s1s2 rrr, no m/r/g, tachy. Lungs:  Tachypnea, diffuse wheeze bilaterally. Abdomen:  Non-distended, soft, bsx4 active.  Musculoskeletal:  No acute deformities. Skin:  Thin, scattered bruises.  LABS:  BMET  Recent Labs Lab 04/25/15 0230 04/26/15 0425 04/27/15 0429  NA 139 141 142  K 4.8 4.2 4.6  CL 98* 105 108  CO2 '30 28 29  '$ BUN 25* 31* 22*  CREATININE 0.64 0.50 0.47  GLUCOSE 116* 125* 142*   Electrolytes  Recent Labs Lab 04/25/15 0230 04/26/15 0425 04/27/15 0429  CALCIUM 9.2 8.1* 8.2*  MG  --  2.1 2.2  PHOS  --  3.0 2.9   CBC  Recent Labs Lab 04/25/15 0230 04/26/15 0425 04/27/15 0429  WBC 7.0 8.4 9.3  HGB 16.3* 13.1 12.3  HCT 49.3* 40.7 38.5  PLT 111* 103* 95*   Coag's No results for input(s): APTT, INR in the last 168 hours.  Sepsis Markers No results for input(s): LATICACIDVEN, PROCALCITON, O2SATVEN in the last 168 hours.  ABG  Recent Labs Lab 04/26/15 0355 04/26/15 1155 04/27/15 0312  PHART 7.380 7.343* 7.396  PCO2ART 48.7* 52.9* 51.8*  PO2ART 84.4 77.6* 97.0   Liver Enzymes No results for input(s): AST, ALT, ALKPHOS, BILITOT, ALBUMIN in the last 168 hours.  Cardiac Enzymes No results for input(s): TROPONINI, PROBNP in the last 168 hours.  Glucose  Recent Labs Lab 04/24/15 1522 04/27/15 0002 04/27/15 0358  GLUCAP 87 150* 142*   Imaging Dg Chest Port 1 View  04/27/2015  CLINICAL DATA:  ET tube EXAM: PORTABLE CHEST 1 VIEW COMPARISON:  04/26/2015 FINDINGS: Endotracheal tube is 2.5 cm above the carina. Right central line and NG tube are unchanged. Heart is normal size. Minimal  bibasilar atelectasis. Mild hyperinflation of the lungs. No effusions or acute bony abnormality. IMPRESSION: Support devices are stable. Minimal bibasilar atelectasis. Electronically Signed   By: Rolm Baptise M.D.   On: 04/27/2015 08:08   Dg Chest Port 1 View  04/26/2015  CLINICAL DATA:  Hypoxia EXAM: PORTABLE CHEST 1 VIEW COMPARISON:  Study obtained earlier in the day FINDINGS: Endotracheal tube tip is 2.3 cm above the carina. Nasogastric tube tip and side port are below the diaphragm. Central catheter tip is in the superior vena cava. No pneumothorax. Lungs are clear. Heart size and pulmonary vascularity are normal. No adenopathy. IMPRESSION: Tube and catheter positions as described without pneumothorax. No edema or consolidation. Electronically Signed   By: Lowella Grip III M.D.   On: 04/26/2015 11:36   Dg Abd Portable 1v  04/26/2015  CLINICAL DATA:  NG tube placement EXAM: PORTABLE ABDOMEN - 1 VIEW COMPARISON:  04/25/2015 FINDINGS: NG tube is in place with the tip in the mid to distal stomach, in stable position since prior study. Nonobstructive bowel gas pattern. No free air organomegaly. IMPRESSION: NG tube tip in the mid to distal stomach in stable position. No obstruction. Electronically Signed   By: Rolm Baptise M.D.   On: 04/26/2015 11:47   STUDIES:  CXR I reviewed myself, hyperinflation.  CULTURES: None  ANTIBIOTICS: None  SIGNIFICANT EVENTS: 12/24  Admit with AECOPD 12/25  Agitation, CIWA 12/26  Hypercarbic respiratory failure on bipap  LINES/TUBES: R [place] TLC 12/27>>>  DISCUSSION: 59 y/o F, current smoker, with PMH of COPD, prior tracheostomy who presented to [facility] on 12/24 with AECOPD.  Admitted per IMTS, developed agitated delirium and treated with CIWA protocol out of concern for ETOH withdrawal.  Developed worsening hypercarbic respiratory failure, treated with BiPAP & PCCM consulted 12/26.   ASSESSMENT / PLAN:  PULMONARY A: Acute Hypercarbic Respiratory Failure   Acute Exacerbation of COPD  Tobacco Abuse  Hx Prior Tracheostomy / respiratory arrest  P:   Full vent support. Need to discuss trach with patient's sister, patient is very unsafe with the ETT in place and given respiratory mechanics. Daily CXR. Continue IV steroids, 60 mg BID Continue budesonide, adjust dosing to 0.5 BID Continue brovana BID  Adjusted duonebs to Q6, hopeful to make PRN in the next few days Abx as below. Nicotine patch  CARDIOVASCULAR A:  Tachycardia  P:  Monitor hemodynamics in ICU. Tele monitoring. ASA QD.  RENAL A:   No acute issues  P:   Trend BMP / UOP  Replace electrolytes as indicated  KVO IVF. Lasix 40 mg IV q6 x3 doses.  GASTROINTESTINAL A:   At Risk For Aspiration  P:   PPI. TF per nutrition.  HEMATOLOGIC A:   Thrombocytopenia - mild  P:  Trend CBC. Monitor for  evidence of bleeding. Transfuse per ICU protocol.  INFECTIOUS A:   AECOPD  P:   Monitor fever curve / WBC. Continue levaquin for airway sterilization.  ENDOCRINE A:   No acute issues  P:   Monitor glucose on BMP.  NEUROLOGIC A:   Agitated Delirium - concern for steroid induced Hx Polysubstance Abuse  Concern for ETOH withdrawal  P:   RASS goal: 3 Propofol and fentanyl for sedation. Continue thiamine / folate / MVI  FAMILY  - Updates: Full code status.  Sister to come in for discussion regarding tracheostomy.  - Inter-disciplinary family meet or Palliative Care meeting due by:   04/30/15  The patient is critically ill with multiple organ systems failure and requires high complexity decision making for assessment and support, frequent evaluation and titration of therapies, application of advanced monitoring technologies and extensive interpretation of multiple databases.   Critical Care Time devoted to patient care services described in this note is  35  Minutes. This time reflects time of care of this signee Dr [name]. This critical care time does not  reflect procedure time, or teaching time or supervisory time of PA/NP/Med student/Med Resident etc but could involve care discussion time.  Rush Farmer, M.D. [facility] Pulmonary/Critical Care Medicine. Pager: [phone]. After hours pager: [phone].

## 2015-04-27 NOTE — Progress Notes (Signed)
eLink Physician-Brief Progress Note Patient Name: Wanda Prince DOB: 1956/01/13 MRN: [id number]   Date of Service  04/27/2015  HPI/Events of Note  Patient c/o facial flushing and itching. Allergic Rxn???  eICU Interventions  Will order: 1. Benadryl 12.5 mg IV Q 6 hours PRN itching or rash.     Intervention Category Intermediate Interventions: Other:  Lysle Dingwall 04/27/2015, 6:31 PM

## 2015-04-27 NOTE — Progress Notes (Signed)
Pt wrote note that face and shoulder and back are itching . Red splotchy rash noted to both shoulders and her face is flush . Leda Gauze RN informed in [facility] for Dr Emmit Alexanders

## 2015-04-28 ENCOUNTER — Inpatient Hospital Stay (HOSPITAL_COMMUNITY): Payer: Medicaid Other

## 2015-04-28 LAB — BLOOD GAS, ARTERIAL
Acid-Base Excess: 11.9 mmol/L — ABNORMAL HIGH (ref 0.0–2.0)
Bicarbonate: 36.7 mEq/L — ABNORMAL HIGH (ref 20.0–24.0)
Drawn by: 418751
FIO2: 0.4
MECHVT: 510 mL
O2 Saturation: 97.3 %
PEEP: 5 cmH2O
Patient temperature: 97.7
RATE: 12 resp/min
TCO2: 38.4 mmol/L (ref 0–100)
pCO2 arterial: 53.4 mmHg — ABNORMAL HIGH (ref 35.0–45.0)
pH, Arterial: 7.449 (ref 7.350–7.450)
pO2, Arterial: 87.1 mmHg (ref 80.0–100.0)

## 2015-04-28 LAB — CBC
HCT: 40.5 % (ref 36.0–46.0)
Hemoglobin: 12.9 g/dL (ref 12.0–15.0)
MCH: 31.5 pg (ref 26.0–34.0)
MCHC: 31.9 g/dL (ref 30.0–36.0)
MCV: 99 fL (ref 78.0–100.0)
Platelets: 99 10*3/uL — ABNORMAL LOW (ref 150–400)
RBC: 4.09 MIL/uL (ref 3.87–5.11)
RDW: 13.6 % (ref 11.5–15.5)
WBC: 8.2 10*3/uL (ref 4.0–10.5)

## 2015-04-28 LAB — BASIC METABOLIC PANEL
Anion gap: 9 (ref 5–15)
BUN: 25 mg/dL — ABNORMAL HIGH (ref 6–20)
CO2: 36 mmol/L — ABNORMAL HIGH (ref 22–32)
Calcium: 8.6 mg/dL — ABNORMAL LOW (ref 8.9–10.3)
Chloride: 100 mmol/L — ABNORMAL LOW (ref 101–111)
Creatinine, Ser: 0.54 mg/dL (ref 0.44–1.00)
GFR calc Af Amer: >60 mL/min (ref >60)
GFR calc non Af Amer: >60 mL/min (ref >60)
Glucose, Bld: 166 mg/dL — ABNORMAL HIGH (ref 65–99)
Potassium: 3.8 mmol/L (ref 3.5–5.1)
Sodium: 145 mmol/L (ref 135–145)

## 2015-04-28 LAB — GLUCOSE, CAPILLARY
Glucose-Capillary: 169 mg/dL — ABNORMAL HIGH (ref 65–99)
Glucose-Capillary: 146 mg/dL — ABNORMAL HIGH (ref 65–99)
Glucose-Capillary: 123 mg/dL — ABNORMAL HIGH (ref 65–99)
Glucose-Capillary: 147 mg/dL — ABNORMAL HIGH (ref 65–99)
Glucose-Capillary: 138 mg/dL — ABNORMAL HIGH (ref 65–99)
Glucose-Capillary: 91 mg/dL (ref 65–99)

## 2015-04-28 LAB — MAGNESIUM: Magnesium: 1.9 mg/dL (ref 1.7–2.4)

## 2015-04-28 LAB — PHOSPHORUS: Phosphorus: 2.8 mg/dL (ref 2.5–4.6)

## 2015-04-28 MED ORDER — CETYLPYRIDINIUM CHLORIDE 0.05 % MT LIQD
7.0000 mL | Freq: Two times a day (BID) | OROMUCOSAL | Status: DC
  Administered 2015-04-29 – 2015-05-01 (×5): 7 mL via OROMUCOSAL

## 2015-04-28 MED ORDER — METHYLPREDNISOLONE SODIUM SUCC 40 MG IJ SOLR
40.0000 mg | Freq: Two times a day (BID) | INTRAMUSCULAR | Status: AC
  Administered 2015-04-28 – 2015-04-29 (×2): 40 mg via INTRAVENOUS
  Filled 2015-04-28 (×3): qty 1

## 2015-04-28 NOTE — Progress Notes (Signed)
PULMONARY / CRITICAL CARE MEDICINE   Name: Wanda Prince MRN: [id number] DOB: May 29, 1955    ADMISSION DATE:  04/22/2015 CONSULTATION DATE:  04/24/15  REFERRING MD:  Dr. Beryle Beams  CHIEF COMPLAINT:  Altered mental status, Hypercarbic respiratory failure   HISTORY OF PRESENT ILLNESS:   59 y/o F, smoker, with PMH of meningitis, VP shunt, seizure disorder, ETOH abuse / polysubstance abuse, depression with prior suicide attempts, IDA, and COPD with prior respiratory arrest & trach who presented to [facility] on 12/24 with shortness of breath.    The patient activated EMS for SOB.  Room air saturations 87% on EMS arrival and treated with albuterol / '125mg'$  IV solu-medrol.  She reported non-productive cough.  In the ER, she was treated with a one hour albuterol treatment with improvement in symptoms.  Initial CXR consistent with emphysema but no acute infiltrate.  Labs - Na 140, K 3.8, Sr cr 0.50, WBC 17.5, and platelets 136.  ABG on 12/25 7.43 / 43 / 78 / 28.  She was admitted by IMTS for COPD exacerbation.  The patient became agitated overnight on 12/25-26 with concerns for ETOH withdrawal and was treated with CIWA protocol.  Subsequently, she became more somnolent.   Follow up ABG assessed 12/26 7.211 / 87 / 117 / 33.7.  The patient was placed on BiPAP with improvement to 7.27 / 73 / 92 / 33.  PCCM consulted for acute on chronic respiratory failure.    The patient is altered and unable to participate in ROS.  RN reports pt was tripoding early this am and appeared restless / air hungry.   SUBJECTIVE:  Agitated but weaning well this AM.  VITAL SIGNS: BP 143/74 mmHg  Pulse 92  Temp(Src) 97.9 F (36.6 C) (Oral)  Resp 10  Ht '5\' 8"'$  (1.727 m)  Wt 55.4 kg (122 lb 2.2 oz)  BMI 18.57 kg/m2  SpO2 100%  HEMODYNAMICS:    VENTILATOR SETTINGS: Vent Mode:  [-] CPAP FiO2 (%):  [40 %] 40 % Set Rate:  [12 bmp] 12 bmp Vt Set:  [510 mL] 510 mL PEEP:  [5 cmH20] 5 cmH20 Pressure Support:  [5 cmH20-15 cmH20]  5 cmH20 Plateau Pressure:  [12 cmH20-16 cmH20] 12 cmH20  INTAKE / OUTPUT: I/O last 3 completed shifts: In: 8366 [I.V.:2100; NG/GT:1400; IV Piggyback:415] Out: 4700 [Urine:4700]  PHYSICAL EXAMINATION: General:  Chronically ill appearing female agitated but following commands. Neuro:  Agitated and moving all ext to command. HEENT:  Mask in place, no jvd, old trach scar. Cardiovascular:  s1s2 rrr, no m/r/g, tachy. Lungs:  Tachypnea, diffuse wheeze bilaterally. Abdomen:  Non-distended, soft, bsx4 active.  Musculoskeletal:  No acute deformities. Skin:  Thin, scattered bruises.  LABS:  BMET  Recent Labs Lab 04/26/15 0425 04/27/15 0429 04/28/15 0415  NA 141 142 145  K 4.2 4.6 3.8  CL 105 108 100*  CO2 28 29 36*  BUN 31* 22* 25*  CREATININE 0.50 0.47 0.54  GLUCOSE 125* 142* 166*   Electrolytes  Recent Labs Lab 04/26/15 0425 04/27/15 0429 04/28/15 0415  CALCIUM 8.1* 8.2* 8.6*  MG 2.1 2.2 1.9  PHOS 3.0 2.9 2.8   CBC  Recent Labs Lab 04/26/15 0425 04/27/15 0429 04/28/15 0415  WBC 8.4 9.3 8.2  HGB 13.1 12.3 12.9  HCT 40.7 38.5 40.5  PLT 103* 95* 99*   Coag's No results for input(s): APTT, INR in the last 168 hours.  Sepsis Markers No results for input(s): LATICACIDVEN, PROCALCITON, O2SATVEN in the last 168  hours.  ABG  Recent Labs Lab 04/26/15 1155 04/27/15 0312 04/28/15 0250  PHART 7.343* 7.396 7.449  PCO2ART 52.9* 51.8* 53.4*  PO2ART 77.6* 97.0 87.1   Liver Enzymes No results for input(s): AST, ALT, ALKPHOS, BILITOT, ALBUMIN in the last 168 hours.  Cardiac Enzymes No results for input(s): TROPONINI, PROBNP in the last 168 hours.  Glucose  Recent Labs Lab 04/27/15 1119 04/27/15 1508 04/27/15 1955 04/27/15 2329 04/28/15 0318 04/28/15 0726  GLUCAP 111* 131* 126* 169* 146* 123*   Imaging Dg Chest Port 1 View  04/28/2015  CLINICAL DATA:  Check endotracheal tube placement EXAM: PORTABLE CHEST - 1 VIEW COMPARISON:  04/27/2015 FINDINGS:  Cardiac shadow is stable. Endotracheal tube is again noted 2.7 cm above the carina. A nasogastric catheter courses into the stomach. A right subclavian catheter is noted in place. Shunt catheter is noted over the right chest. The lungs are well aerated bilaterally. Minimal atelectatic changes are again seen. No new focal abnormality is noted. IMPRESSION: No significant change from the prior exam. Electronically Signed   By: Inez Catalina M.D.   On: 04/28/2015 07:34   STUDIES:  CXR I reviewed myself, hyperinflation.  CULTURES: None  ANTIBIOTICS: None  SIGNIFICANT EVENTS: 12/24  Admit with AECOPD 12/25  Agitation, CIWA 12/26  Hypercarbic respiratory failure on bipap  LINES/TUBES: R [place] TLC 12/27>>>  DISCUSSION: 59 y/o F, current smoker, with PMH of COPD, prior tracheostomy who presented to [facility] on 12/24 with AECOPD.  Admitted per IMTS, developed agitated delirium and treated with CIWA protocol out of concern for ETOH withdrawal.  Developed worsening hypercarbic respiratory failure, treated with BiPAP & PCCM consulted 12/26.   ASSESSMENT / PLAN:  PULMONARY A: Acute Hypercarbic Respiratory Failure  Acute Exacerbation of COPD  Tobacco Abuse  Hx Prior Tracheostomy / respiratory arrest  P:   SBT to extubate today. Daily CXR. Decrease IV steroids, 40 mg BID, change to PO in AM if able to stay extubated. Continue budesonide, adjust dosing to 0.5 BID Continue brovana BID  Adjusted duonebs to Q6, hopeful to make PRN in the next few days Abx as below. Nicotine patch  CARDIOVASCULAR A:  Tachycardia  P:  Monitor hemodynamics in ICU. Tele monitoring. ASA QD.  RENAL A:   No acute issues  P:   Trend BMP / UOP  Replace electrolytes as indicated  KVO IVF. Hold further lasix.  GASTROINTESTINAL A:   At Risk For Aspiration  P:   PPI. Diet if able to stay off the vent.  HEMATOLOGIC A:   Thrombocytopenia - mild  P:  Trend CBC. Monitor for evidence of bleeding. Transfuse per  ICU protocol.  INFECTIOUS A:   AECOPD  P:   Monitor fever curve / WBC. Continue levaquin for airway sterilization.  ENDOCRINE A:   No acute issues  P:   Monitor glucose on BMP.  NEUROLOGIC A:   Agitated Delirium - concern for steroid induced Hx Polysubstance Abuse  Concern for ETOH withdrawal  P:   D/C sedation. Continue thiamine / folate / MVI  FAMILY  - Updates: Full code status.  - Inter-disciplinary family meet or Palliative Care meeting due by:   04/30/15  The patient is critically ill with multiple organ systems failure and requires high complexity decision making for assessment and support, frequent evaluation and titration of therapies, application of advanced monitoring technologies and extensive interpretation of multiple databases.   Critical Care Time devoted to patient care services described in this note is  64  Minutes. This time reflects time of care of this signee Dr [name]. This critical care time does not reflect procedure time, or teaching time or supervisory time of PA/NP/Med student/Med Resident etc but could involve care discussion time.  Rush Farmer, M.D. [facility] Pulmonary/Critical Care Medicine. Pager: [phone]. After hours pager: [phone].

## 2015-04-28 NOTE — Progress Notes (Signed)
Patient currently on [facility] and is in no distress at this time. BIPAP is not needed at this time. Will monitor as needed.

## 2015-04-28 NOTE — Progress Notes (Signed)
200 mls Fentanyl wasted after extubation and witnessed by Neysa Hotter RN

## 2015-04-28 NOTE — Progress Notes (Signed)
Utilization review completed.  

## 2015-04-28 NOTE — Procedures (Signed)
Extubation Procedure Note  Patient Details:   Name: Wanda Prince DOB: 10-May-1955 MRN: [id number]   Airway Documentation:  Airway 7.5 mm (Active)  Secured at (cm) 26 cm 04/28/2015  9:19 AM  Measured From Lips 04/28/2015  9:19 AM  Secured Location Right 04/28/2015  9:19 AM  Secured By [employer] 04/28/2015  9:19 AM  Tube Holder Repositioned Yes 04/28/2015  8:00 AM  Cuff Pressure (cm H2O) 26 cm H2O 04/27/2015  8:16 PM  Site Condition Dry 04/28/2015  9:19 AM    Evaluation  O2 sats: stable throughout Complications: No apparent complications Patient did tolerate procedure well. Bilateral Breath Sounds: Clear, Diminished Suctioning: Oral, Airway Yes  Johnette Abraham 04/28/2015, 11:21 AM

## 2015-04-29 ENCOUNTER — Encounter (HOSPITAL_COMMUNITY): Payer: Self-pay | Admitting: *Deleted

## 2015-04-29 LAB — BASIC METABOLIC PANEL
Anion gap: 7 (ref 5–15)
BUN: 18 mg/dL (ref 6–20)
CO2: 35 mmol/L — ABNORMAL HIGH (ref 22–32)
Calcium: 8.5 mg/dL — ABNORMAL LOW (ref 8.9–10.3)
Chloride: 101 mmol/L (ref 101–111)
Creatinine, Ser: 0.39 mg/dL — ABNORMAL LOW (ref 0.44–1.00)
GFR calc Af Amer: >60 mL/min (ref >60)
GFR calc non Af Amer: >60 mL/min (ref >60)
Glucose, Bld: 126 mg/dL — ABNORMAL HIGH (ref 65–99)
Potassium: 4.5 mmol/L (ref 3.5–5.1)
Sodium: 143 mmol/L (ref 135–145)

## 2015-04-29 MED ORDER — LORAZEPAM 2 MG/ML IJ SOLN
1.0000 mg | Freq: Once | INTRAMUSCULAR | Status: AC
  Administered 2015-04-29: 1 mg via INTRAVENOUS
  Filled 2015-04-29: qty 1

## 2015-04-29 MED ORDER — ACETAMINOPHEN 650 MG RE SUPP: 650.0000 mg | RECTAL | Status: DC | PRN

## 2015-04-29 MED ORDER — ACETAMINOPHEN 325 MG PO TABS
650.0000 mg | ORAL_TABLET | Freq: Four times a day (QID) | ORAL | Status: DC | PRN
  Administered 2015-04-29: 650 mg via ORAL
  Filled 2015-04-29: qty 2

## 2015-04-29 MED ORDER — METHYLPREDNISOLONE SODIUM SUCC 40 MG IJ SOLR
40.0000 mg | Freq: Four times a day (QID) | INTRAMUSCULAR | Status: AC
  Administered 2015-04-29 (×2): 40 mg via INTRAVENOUS
  Filled 2015-04-29 (×2): qty 1

## 2015-04-29 MED ORDER — IPRATROPIUM-ALBUTEROL 0.5-2.5 (3) MG/3ML IN SOLN
3.0000 mL | Freq: Four times a day (QID) | RESPIRATORY_TRACT | Status: DC
  Administered 2015-04-29 – 2015-04-30 (×5): 3 mL via RESPIRATORY_TRACT
  Filled 2015-04-29 (×5): qty 3

## 2015-04-29 MED ORDER — PREDNISONE 20 MG PO TABS
40.0000 mg | ORAL_TABLET | Freq: Every day | ORAL | Status: DC
  Filled 2015-04-29: qty 2

## 2015-04-29 MED ORDER — IPRATROPIUM-ALBUTEROL 0.5-2.5 (3) MG/3ML IN SOLN
3.0000 mL | Freq: Four times a day (QID) | RESPIRATORY_TRACT | Status: DC
  Administered 2015-04-29: 3 mL via RESPIRATORY_TRACT
  Filled 2015-04-29: qty 3

## 2015-04-29 MED ORDER — ONDANSETRON HCL 4 MG/2ML IJ SOLN
4.0000 mg | Freq: Four times a day (QID) | INTRAMUSCULAR | Status: DC | PRN
  Administered 2015-04-29 – 2015-05-01 (×2): 4 mg via INTRAVENOUS
  Filled 2015-04-29: qty 2

## 2015-04-29 MED ORDER — PANTOPRAZOLE SODIUM 40 MG IV SOLR
40.0000 mg | INTRAVENOUS | Status: DC
Start: 2015-04-29 — End: 2015-04-30
  Administered 2015-04-29: 40 mg via INTRAVENOUS
  Filled 2015-04-29: qty 40

## 2015-04-29 MED ORDER — PANTOPRAZOLE SODIUM 40 MG PO TBEC: 40.0000 mg | DELAYED_RELEASE_TABLET | Freq: Every day | ORAL | Status: DC

## 2015-04-29 MED ORDER — ACETAMINOPHEN 325 MG PO TABS
650.0000 mg | ORAL_TABLET | Freq: Four times a day (QID) | ORAL | Status: DC | PRN
  Administered 2015-04-30 – 2015-05-07 (×14): 650 mg via ORAL
  Filled 2015-04-29 (×14): qty 2

## 2015-04-29 NOTE — Progress Notes (Signed)
PULMONARY / CRITICAL CARE MEDICINE   Name: Wanda Prince MRN: [id number] DOB: 09-08-1955    ADMISSION DATE:  04/22/2015 CONSULTATION DATE:  04/24/15  REFERRING MD:  Dr. Beryle Beams  CHIEF COMPLAINT:  Altered mental status, Hypercarbic respiratory failure   HISTORY OF PRESENT ILLNESS:   59 y/o F, smoker, with PMH of meningitis, VP shunt, seizure disorder, ETOH abuse / polysubstance abuse, depression with prior suicide attempts, IDA, and COPD with prior respiratory arrest & trach who presented to [facility] on 12/24 with shortness of breath.    The patient activated EMS for SOB.  Room air saturations 87% on EMS arrival and treated with albuterol / '125mg'$  IV solu-medrol.  She reported non-productive cough.  In the ER, she was treated with a one hour albuterol treatment with improvement in symptoms.  Initial CXR consistent with emphysema but no acute infiltrate.  Labs - Na 140, K 3.8, Sr cr 0.50, WBC 17.5, and platelets 136.  ABG on 12/25 7.43 / 43 / 78 / 28.  She was admitted by IMTS for COPD exacerbation.  The patient became agitated overnight on 12/25-26 with concerns for ETOH withdrawal and was treated with CIWA protocol.  Subsequently, she became more somnolent.   Follow up ABG assessed 12/26 7.211 / 87 / 117 / 33.7.  The patient was placed on BiPAP with improvement to 7.27 / 73 / 92 / 33.  PCCM consulted for acute on chronic respiratory failure.    The patient is altered and unable to participate in ROS.  RN reports pt was tripoding early this am and appeared restless / air hungry.   SUBJECTIVE:  Extubated yesterday, doing well, no increased wob , good sats Alert talking in bed this am. No wheezing /stridor. Says her throat is sore.  Swallow ok  VITAL SIGNS: BP 123/69 mmHg  Pulse 75  Temp(Src) 98.3 F (36.8 C) (Oral)  Resp 18  Ht '5\' 8"'$  (1.727 m)  Wt 53.2 kg (117 lb 4.6 oz)  BMI 17.84 kg/m2  SpO2 98%  HEMODYNAMICS:    VENTILATOR SETTINGS: Vent Mode:  [-] CPAP FiO2 (%):  [40 %] 40  % PEEP:  [5 cmH20] 5 cmH20 Pressure Support:  [5 cmH20-15 cmH20] 5 cmH20  INTAKE / OUTPUT: I/O last 3 completed shifts: In: 1946.7 [P.O.:180; I.V.:546.7; NG/GT:810; IV Piggyback:410] Out: 2650 [Urine:2650]  PHYSICAL EXAMINATION: General:  Chronically ill appearing female  following commands. Neuro:  A/o x3 , moving all ext to command. HEENT: dry mucosa ,  no jvd, old trach scar. Cardiovascular:  s1s2 rrr, no m/r/g, Lungs:  Decreased bs in bases , no wheezing . Abdomen:  Non-distended, soft, bsx4 active.  Musculoskeletal:  No acute deformities. Skin:  Thin, scattered bruises.  LABS:  BMET  Recent Labs Lab 04/27/15 0429 04/28/15 0415 04/29/15 0435  NA 142 145 143  K 4.6 3.8 4.5  CL 108 100* 101  CO2 29 36* 35*  BUN 22* 25* 18  CREATININE 0.47 0.54 0.39*  GLUCOSE 142* 166* 126*   Electrolytes  Recent Labs Lab 04/26/15 0425 04/27/15 0429 04/28/15 0415 04/29/15 0435  CALCIUM 8.1* 8.2* 8.6* 8.5*  MG 2.1 2.2 1.9  --   PHOS 3.0 2.9 2.8  --    CBC  Recent Labs Lab 04/26/15 0425 04/27/15 0429 04/28/15 0415  WBC 8.4 9.3 8.2  HGB 13.1 12.3 12.9  HCT 40.7 38.5 40.5  PLT 103* 95* 99*   Coag's No results for input(s): APTT, INR in the last 168 hours.  Sepsis  Markers No results for input(s): LATICACIDVEN, PROCALCITON, O2SATVEN in the last 168 hours.  ABG  Recent Labs Lab 04/26/15 1155 04/27/15 0312 04/28/15 0250  PHART 7.343* 7.396 7.449  PCO2ART 52.9* 51.8* 53.4*  PO2ART 77.6* 97.0 87.1   Liver Enzymes No results for input(s): AST, ALT, ALKPHOS, BILITOT, ALBUMIN in the last 168 hours.  Cardiac Enzymes No results for input(s): TROPONINI, PROBNP in the last 168 hours.  Glucose  Recent Labs Lab 04/27/15 2329 04/28/15 0318 04/28/15 0726 04/28/15 1254 04/28/15 1513 04/28/15 2022  GLUCAP 169* 146* 123* 147* 138* 91   Imaging No results found. STUDIES:  CXR  Hyperinflation 12/30 /reviewed myself    CULTURES: None  ANTIBIOTICS: None  SIGNIFICANT EVENTS: 12/24  Admit with AECOPD 12/25  Agitation, CIWA 12/26  Hypercarbic respiratory failure on bipap 12/30 extubated   LINES/TUBES: R [place] TLC 12/27>>>  DISCUSSION: 59 y/o F, current smoker, with PMH of COPD, prior tracheostomy who presented to [facility] on 12/24 with AECOPD.  Admitted per IMTS, developed agitated delirium and treated with CIWA protocol out of concern for ETOH withdrawal.  Developed worsening hypercarbic respiratory failure, treated with BiPAP & PCCM consulted 12/26. Extubated 12/30.   ASSESSMENT / PLAN:  PULMONARY A: Acute Hypercarbic Respiratory Failure  Acute Exacerbation of COPD  Tobacco Abuse  Hx Prior Tracheostomy / respiratory arrest  P:   Decrease steroids, change to oral prednisone  Continue budesonide Twice daily   Continue brovana BID  Adjusted duonebs to Q6, hopeful to make PRN in the next few days Abx as below. Smoking cessation, Nicotine patch Will need follow up with pulmonary OP clinic  w/ PFT .   CARDIOVASCULAR A:  Tachycardia >resolved  HTN -on  P:  Monitor hemodynamics in ICU. Tele monitoring. ASA QD. Continue Metoprolol  Avoid ACE Inhibitor if possible (on Lisinopril prior to admit)    RENAL A:   No acute issues  P:   Trend BMP / UOP  Replace electrolytes as indicated  KVO IVF. Hold further lasix.  GASTROINTESTINAL A:   At Risk For Aspiration  P:   PPI. Advance diet as able   HEMATOLOGIC A:   Thrombocytopenia - mild  P:  Trend CBC. Monitor for evidence of bleeding. Transfuse per ICU protocol.  INFECTIOUS A:   AECOPD  P:   Monitor fever curve / WBC. Continue levaquin for airway sterilization.  ENDOCRINE A:   No acute issues  P:   Monitor glucose on BMP.  NEUROLOGIC A:   Agitated Delirium - concern for steroid induced Hx Polysubstance Abuse  Concern for ETOH withdrawal  P:    Continue thiamine / folate / MVI Taper steroids as able   FAMILY  -  Updates: Full code status.  - Inter-disciplinary family meet or Palliative Care meeting due by:   04/30/15   Mckynlie Vanderslice NP-C  [place] [facility] and [facility]  [phone]

## 2015-04-29 NOTE — Progress Notes (Signed)
Pt continuing to have SOB and increased WOB after the breathing treatment. O2 sats 96% on 5L [place]. MD notified. Orders to place pt on BiPAP received. Will continue to monitor.

## 2015-04-29 NOTE — Progress Notes (Signed)
Pt complaining of SOB and increased work of breathing. Oxygen increased from 3.5 to 5 L. Respiratory notified and asked to give pt a breathing treatment. NP Tammy Parrett notified. Will continue to monitor closely.

## 2015-04-29 NOTE — Progress Notes (Signed)
Pt. Was weak and on a C-pap so I did not ask the pt. To respond by speaking, simply by nodding. I ask if she would like prayer and she nodded in the affirmative. I lifted the pt. In prayer and provided comfort.

## 2015-04-29 NOTE — Progress Notes (Signed)
Patient resting comfortably and in no apparent respiratory distress upon receiving breathing treatment.  SpO2 97% on 3.5lpm [place].  No accessory muscle use noted.  Patient denies SOB at current time.  No need for Bipap at this time, but RT will continue to monitor.

## 2015-04-30 ENCOUNTER — Inpatient Hospital Stay (HOSPITAL_COMMUNITY): Payer: Medicaid Other

## 2015-04-30 LAB — CBC
HCT: 40.5 % (ref 36.0–46.0)
Hemoglobin: 13.3 g/dL (ref 12.0–15.0)
MCH: 32.3 pg (ref 26.0–34.0)
MCHC: 32.8 g/dL (ref 30.0–36.0)
MCV: 98.3 fL (ref 78.0–100.0)
Platelets: 86 10*3/uL — ABNORMAL LOW (ref 150–400)
RBC: 4.12 MIL/uL (ref 3.87–5.11)
RDW: 13.3 % (ref 11.5–15.5)
WBC: 9.2 10*3/uL (ref 4.0–10.5)

## 2015-04-30 LAB — BASIC METABOLIC PANEL
Anion gap: 8 (ref 5–15)
BUN: 10 mg/dL (ref 6–20)
CO2: 35 mmol/L — ABNORMAL HIGH (ref 22–32)
Calcium: 8.6 mg/dL — ABNORMAL LOW (ref 8.9–10.3)
Chloride: 97 mmol/L — ABNORMAL LOW (ref 101–111)
Creatinine, Ser: 0.44 mg/dL (ref 0.44–1.00)
GFR calc Af Amer: >60 mL/min (ref >60)
GFR calc non Af Amer: >60 mL/min (ref >60)
Glucose, Bld: 115 mg/dL — ABNORMAL HIGH (ref 65–99)
Potassium: 4.2 mmol/L (ref 3.5–5.1)
Sodium: 140 mmol/L (ref 135–145)

## 2015-04-30 MED ORDER — PANTOPRAZOLE SODIUM 40 MG PO TBEC
40.0000 mg | DELAYED_RELEASE_TABLET | Freq: Every day | ORAL | Status: DC
  Administered 2015-04-30: 40 mg via ORAL
  Filled 2015-04-30: qty 1

## 2015-04-30 MED ORDER — IPRATROPIUM BROMIDE 0.02 % IN SOLN: 0.5000 mg | Freq: Four times a day (QID) | RESPIRATORY_TRACT | Status: DC

## 2015-04-30 MED ORDER — ALBUTEROL SULFATE (2.5 MG/3ML) 0.083% IN NEBU
2.5000 mg | INHALATION_SOLUTION | RESPIRATORY_TRACT | Status: DC
  Administered 2015-04-30 – 2015-05-01 (×6): 2.5 mg via RESPIRATORY_TRACT
  Filled 2015-04-30 (×5): qty 3

## 2015-04-30 MED ORDER — IPRATROPIUM BROMIDE 0.02 % IN SOLN
0.5000 mg | Freq: Four times a day (QID) | RESPIRATORY_TRACT | Status: DC
  Administered 2015-05-01: 0.5 mg via RESPIRATORY_TRACT
  Filled 2015-04-30: qty 2.5

## 2015-04-30 MED ORDER — NICOTINE 14 MG/24HR TD PT24
14.0000 mg | MEDICATED_PATCH | Freq: Every day | TRANSDERMAL | Status: DC
  Administered 2015-05-01 – 2015-05-07 (×7): 14 mg via TRANSDERMAL
  Filled 2015-04-30 (×7): qty 1

## 2015-04-30 MED ORDER — METHYLPREDNISOLONE SODIUM SUCC 40 MG IJ SOLR
40.0000 mg | Freq: Two times a day (BID) | INTRAMUSCULAR | Status: DC
  Administered 2015-04-30 (×2): 40 mg via INTRAVENOUS
  Filled 2015-04-30 (×4): qty 1

## 2015-04-30 NOTE — Progress Notes (Signed)
eLink Physician-Brief Progress Note Patient Name: Wanda Prince DOB: March 18, 1956 MRN: [id number]   Date of Service  04/30/2015  HPI/Events of Note  resp failure. Copd exac  eICU Interventions  Change to more frequent neb meds     Intervention Category Major Interventions: Respiratory failure - evaluation and management  Asencion Noble 04/30/2015, 9:20 PM

## 2015-04-30 NOTE — Progress Notes (Signed)
Pt placed on Bipap due to feeling SOB. Pt is tolerating well at this time

## 2015-04-30 NOTE — Progress Notes (Signed)
PRN albuterol tx given x2 with consent from MD. Pt placed back on bipap due to increased WOB. Pt states she feels a little better. RT will continue to monitor.

## 2015-04-30 NOTE — Progress Notes (Signed)
PULMONARY / CRITICAL CARE MEDICINE   Name: Wanda Prince MRN: [id number] DOB: [birth year]    ADMISSION DATE:  04/22/2015 CONSULTATION DATE:  04/24/15  REFERRING MD:  Dr. Beryle Beams  CHIEF COMPLAINT:  Altered mental status, Hypercarbic respiratory failure   HISTORY OF PRESENT ILLNESS:   60 y/o F, smoker, with PMH of meningitis, VP shunt, seizure disorder, ETOH abuse / polysubstance abuse, depression with prior suicide attempts, IDA, and COPD with prior respiratory arrest & trach who presented to [facility] on 12/24 with shortness of breath.    The patient activated EMS for SOB.  Room air saturations 87% on EMS arrival and treated with albuterol / '125mg'$  IV solu-medrol.  She reported non-productive cough.  In the ER, she was treated with a one hour albuterol treatment with improvement in symptoms.  Initial CXR consistent with emphysema but no acute infiltrate.  Labs - Na 140, K 3.8, Sr cr 0.50, WBC 17.5, and platelets 136.  ABG on 12/25 7.43 / 43 / 78 / 28.  She was admitted by IMTS for COPD exacerbation.  The patient became agitated overnight on 12/25-26 with concerns for ETOH withdrawal and was treated with CIWA protocol.  Subsequently, she became more somnolent.   Follow up ABG assessed 12/26 7.211 / 87 / 117 / 33.7.  The patient was placed on BiPAP with improvement to 7.27 / 73 / 92 / 33.  PCCM consulted for acute on chronic respiratory failure.    The patient is altered and unable to participate in ROS.  RN reports pt was tripoding early this am and appeared restless / air hungry.   SUBJECTIVE:  Extubated 12/30 , resp distress yesterday, improved with BIPAP.  Weaned to [place] this am , feeling better.  no increased wob , good sats Says she is hungry and wants to eat. Sore throat is better.  Able to sit up in bed   VITAL SIGNS: BP 124/70 mmHg  Pulse 84  Temp(Src) 98.4 F (36.9 C) (Oral)  Resp 16  Ht '5\' 8"'$  (1.727 m)  Wt 52.1 kg (114 lb 13.8 oz)  BMI 17.47 kg/m2  SpO2 99%  HEMODYNAMICS:     VENTILATOR SETTINGS: Vent Mode:  [-] BIPAP FiO2 (%):  [40 %] 40 %  INTAKE / OUTPUT: I/O last 3 completed shifts: In: 462 [I.V.:300; IV Piggyback:415] Out: 2765 [Urine:2765]  PHYSICAL EXAMINATION: General:  Chronically ill appearing female  following commands. Neuro:  A/o x3 , moving all ext to command.,  HEENT: dry mucosa ,  no jvd, old trach scar. Cardiovascular:  s1s2 rrr, no m/r/g, Lungs:  Decreased bs in bases , faint exp  wheezing  On left  Abdomen:  Non-distended, soft, bsx4 active.  Musculoskeletal:  No acute deformities. Skin:  Thin, scattered bruises.  LABS:  BMET  Recent Labs Lab 04/28/15 0415 04/29/15 0435 04/30/15 0445  NA 145 143 140  K 3.8 4.5 4.2  CL 100* 101 97*  CO2 36* 35* 35*  BUN 25* 18 10  CREATININE 0.54 0.39* 0.44  GLUCOSE 166* 126* 115*   Electrolytes  Recent Labs Lab 04/26/15 0425 04/27/15 0429 04/28/15 0415 04/29/15 0435 04/30/15 0445  CALCIUM 8.1* 8.2* 8.6* 8.5* 8.6*  MG 2.1 2.2 1.9  --   --   PHOS 3.0 2.9 2.8  --   --    CBC  Recent Labs Lab 04/27/15 0429 04/28/15 0415 04/30/15 0445  WBC 9.3 8.2 9.2  HGB 12.3 12.9 13.3  HCT 38.5 40.5 40.5  PLT 95* 99*  86*   Coag's No results for input(s): APTT, INR in the last 168 hours.  Sepsis Markers No results for input(s): LATICACIDVEN, PROCALCITON, O2SATVEN in the last 168 hours.  ABG  Recent Labs Lab 04/26/15 1155 04/27/15 0312 04/28/15 0250  PHART 7.343* 7.396 7.449  PCO2ART 52.9* 51.8* 53.4*  PO2ART 77.6* 97.0 87.1   Liver Enzymes No results for input(s): AST, ALT, ALKPHOS, BILITOT, ALBUMIN in the last 168 hours.  Cardiac Enzymes No results for input(s): TROPONINI, PROBNP in the last 168 hours.  Glucose  Recent Labs Lab 04/27/15 2329 04/28/15 0318 04/28/15 0726 04/28/15 1254 04/28/15 1513 04/28/15 2022  GLUCAP 169* 146* 123* 147* 138* 91   Imaging No results found. STUDIES:  CXR  Hyperinflation 12/30 /reviewed myself    CULTURES: None  ANTIBIOTICS: None  SIGNIFICANT EVENTS: 12/24  Admit with AECOPD 12/25  Agitation, CIWA 12/26  Hypercarbic respiratory failure on bipap 12/30 extubated  12/31 BIPAP support  1/1 weaned off BIPAP   LINES/TUBES: R [place] TLC 12/27>>>  DISCUSSION: 60 y/o F, current smoker, with PMH of COPD, prior tracheostomy who presented to [facility] on 12/24 with AECOPD.  Admitted per IMTS, developed agitated delirium and treated with CIWA protocol out of concern for ETOH withdrawal.  Developed worsening hypercarbic respiratory failure, treated with BiPAP & PCCM consulted 12/26. Extubated 12/30. Resp Distress improved w/ BIPAP on 12/31.  Weaned off BIPAP 1/1 .   ASSESSMENT / PLAN:  PULMONARY A: Acute Hypercarbic Respiratory Failure  Acute Exacerbation of COPD  Tobacco Abuse  Hx Prior Tracheostomy / respiratory arrest  P:    Continue budesonide Twice daily   Continue brovana BID  Cont duonebs to Q6, hopeful to make PRN in the next few days  Smoking cessation, Nicotine patch Will need follow up with pulmonary OP clinic  w/ PFT .  Wean O2 to keep sat >90%.  Mobilize pt as able   CARDIOVASCULAR A:  Tachycardia >resolved  HTN -on  P:  Monitor hemodynamics in ICU. Tele monitoring. ASA QD. Avoid ACE Inhibitor if possible (on Lisinopril prior to admit)    RENAL A:   No acute issues  P:   Trend BMP / UOP  Replace electrolytes as indicated     GASTROINTESTINAL A:   At Risk For Aspiration  P:   PPI. Advance diet as able   HEMATOLOGIC A:   Thrombocytopenia - mild  P:  Trend CBC. Monitor for evidence of bleeding. Transfuse per ICU protocol.  INFECTIOUS A:   AECOPD  P:   Monitor fever curve / WBC.    ENDOCRINE A:   No acute issues  P:   Monitor glucose on BMP.  NEUROLOGIC A:   Agitated Delirium - concern for steroid induced Hx Polysubstance Abuse  Concern for ETOH withdrawal  P:    Continue thiamine / folate / MVI    FAMILY  - Updates: Full  code status.  - Inter-disciplinary family meet or Palliative Care meeting due by:   04/30/15   Ja Ohman NP-C  [facility]  [phone]

## 2015-04-30 NOTE — Progress Notes (Signed)
Pt requested to be placed back on Bipap due to labored breathing. Pt is now comfortable. RN notified.

## 2015-04-30 NOTE — Progress Notes (Signed)
Pt taken off Bipap at this time by RN. Per RN pt complaining about wearing it. Pt placed on N/C and tolerating well.

## 2015-04-30 NOTE — Progress Notes (Signed)
RT called to pt's room to take off Bipap. Pt placed on [place] 3.5 Lpm and comfortable.

## 2015-05-01 DIAGNOSIS — Z0189 Encounter for other specified special examinations: Secondary | ICD-10-CM | POA: Insufficient documentation

## 2015-05-01 DIAGNOSIS — Z4659 Encounter for fitting and adjustment of other gastrointestinal appliance and device: Secondary | ICD-10-CM | POA: Insufficient documentation

## 2015-05-01 LAB — BASIC METABOLIC PANEL
Anion gap: 7 (ref 5–15)
BUN: 10 mg/dL (ref 6–20)
CO2: 36 mmol/L — ABNORMAL HIGH (ref 22–32)
Calcium: 8.5 mg/dL — ABNORMAL LOW (ref 8.9–10.3)
Chloride: 99 mmol/L — ABNORMAL LOW (ref 101–111)
Creatinine, Ser: 0.45 mg/dL (ref 0.44–1.00)
GFR calc Af Amer: >60 mL/min (ref >60)
GFR calc non Af Amer: >60 mL/min (ref >60)
Glucose, Bld: 148 mg/dL — ABNORMAL HIGH (ref 65–99)
Potassium: 3.9 mmol/L (ref 3.5–5.1)
Sodium: 142 mmol/L (ref 135–145)

## 2015-05-01 LAB — CBC
HCT: 41.2 % (ref 36.0–46.0)
Hemoglobin: 13.2 g/dL (ref 12.0–15.0)
MCH: 31.3 pg (ref 26.0–34.0)
MCHC: 32 g/dL (ref 30.0–36.0)
MCV: 97.6 fL (ref 78.0–100.0)
Platelets: 105 10*3/uL — ABNORMAL LOW (ref 150–400)
RBC: 4.22 MIL/uL (ref 3.87–5.11)
RDW: 13.4 % (ref 11.5–15.5)
WBC: 9.4 10*3/uL (ref 4.0–10.5)

## 2015-05-01 LAB — POCT I-STAT 3, ART BLOOD GAS (G3+)
Acid-Base Excess: 11 mmol/L — ABNORMAL HIGH (ref 0.0–2.0)
Bicarbonate: 39.2 mEq/L — ABNORMAL HIGH (ref 20.0–24.0)
O2 Saturation: 96 %
Patient temperature: 98.6
TCO2: 41 mmol/L (ref 0–100)
pCO2 arterial: 65.7 mmHg (ref 35.0–45.0)
pH, Arterial: 7.384 (ref 7.350–7.450)
pO2, Arterial: 85 mmHg (ref 80.0–100.0)

## 2015-05-01 MED ORDER — IPRATROPIUM-ALBUTEROL 0.5-2.5 (3) MG/3ML IN SOLN
3.0000 mL | Freq: Four times a day (QID) | RESPIRATORY_TRACT | Status: DC
  Administered 2015-05-01 (×3): 3 mL via RESPIRATORY_TRACT
  Filled 2015-05-01 (×3): qty 3

## 2015-05-01 MED ORDER — BUDESONIDE 0.25 MG/2ML IN SUSP
0.2500 mg | Freq: Four times a day (QID) | RESPIRATORY_TRACT | Status: DC
  Administered 2015-05-01 (×2): 0.25 mg via RESPIRATORY_TRACT
  Filled 2015-05-01 (×2): qty 2

## 2015-05-01 MED ORDER — LEVETIRACETAM 500 MG PO TABS
500.0000 mg | ORAL_TABLET | Freq: Two times a day (BID) | ORAL | Status: DC
  Administered 2015-05-01 – 2015-05-07 (×12): 500 mg via ORAL
  Filled 2015-05-01 (×12): qty 1

## 2015-05-01 MED ORDER — METHYLPREDNISOLONE SODIUM SUCC 125 MG IJ SOLR
60.0000 mg | Freq: Three times a day (TID) | INTRAMUSCULAR | Status: DC
  Administered 2015-05-02 – 2015-05-03 (×4): 60 mg via INTRAVENOUS
  Filled 2015-05-01 (×4): qty 2

## 2015-05-01 MED ORDER — ZOLPIDEM TARTRATE 5 MG PO TABS
5.0000 mg | ORAL_TABLET | Freq: Once | ORAL | Status: AC
  Administered 2015-05-01: 5 mg via ORAL
  Filled 2015-05-01: qty 1

## 2015-05-01 MED ORDER — CETYLPYRIDINIUM CHLORIDE 0.05 % MT LIQD
7.0000 mL | Freq: Two times a day (BID) | OROMUCOSAL | Status: DC
  Administered 2015-05-02 – 2015-05-03 (×4): 7 mL via OROMUCOSAL

## 2015-05-01 MED ORDER — ARFORMOTEROL TARTRATE 15 MCG/2ML IN NEBU
15.0000 ug | INHALATION_SOLUTION | Freq: Two times a day (BID) | RESPIRATORY_TRACT | Status: DC
  Administered 2015-05-01 – 2015-05-04 (×6): 15 ug via RESPIRATORY_TRACT
  Filled 2015-05-01 (×6): qty 2

## 2015-05-01 MED ORDER — BUTAMBEN-TETRACAINE-BENZOCAINE 2-2-14 % EX AERO: 1.0000 | INHALATION_SPRAY | Freq: Once | CUTANEOUS | Status: DC

## 2015-05-01 MED ORDER — METHYLPREDNISOLONE SODIUM SUCC 125 MG IJ SOLR
125.0000 mg | Freq: Once | INTRAMUSCULAR | Status: AC
  Administered 2015-05-01: 125 mg via INTRAVENOUS
  Filled 2015-05-01: qty 2

## 2015-05-01 MED ORDER — CHLORHEXIDINE GLUCONATE 0.12 % MT SOLN
15.0000 mL | Freq: Two times a day (BID) | OROMUCOSAL | Status: DC
  Administered 2015-05-02 – 2015-05-04 (×6): 15 mL via OROMUCOSAL
  Filled 2015-05-01 (×6): qty 15

## 2015-05-01 MED ORDER — MORPHINE SULFATE (PF) 2 MG/ML IV SOLN
1.0000 mg | Freq: Once | INTRAVENOUS | Status: AC
  Administered 2015-05-01: 1 mg via INTRAVENOUS
  Filled 2015-05-01: qty 1

## 2015-05-01 MED ORDER — PREDNISONE 20 MG PO TABS
40.0000 mg | ORAL_TABLET | Freq: Two times a day (BID) | ORAL | Status: DC
  Administered 2015-05-01: 40 mg via ORAL
  Filled 2015-05-01 (×2): qty 2

## 2015-05-01 NOTE — Progress Notes (Signed)
05/01/2015 11:50 AM  Pt transferred from 35M to 6E24.  Pt fully alert and oriented, vitals stable, skin intact.  Full assessment to EPIC.  Pt placed on telemetry box 24, confirmed with [name], RN and CCMD.  Pt is on 4L of oxygen.  Pt denies any falls in the past six months but does score a 10 on the risk scale.  Score and necessary interventions explained to the patient, to which she verbalized understanding.  Pt placed on bed alarm; bed in lowest position, fall risk arm band and yellow socks already in place.  Pt oriented to room/unit, and was instructed on how to utilize the call bell, to which she verbalized understanding.  Will continue to monitor. Princella Pellegrini

## 2015-05-01 NOTE — Progress Notes (Signed)
eLink Physician-Brief Progress Note Patient Name: Wanda Prince DOB: 1955/12/30 MRN: [id number]   Date of Service  05/01/2015  HPI/Events of Note  Patient requests sleeping aid.  eICU Interventions  Will order Ambien 5 mg PO X 1 now.      Intervention Category Minor Interventions: Routine modifications to care plan (e.g. PRN medications for pain, fever)  Sommer,Steven Eugene 05/01/2015, 1:03 AM

## 2015-05-01 NOTE — Progress Notes (Signed)
Report called to [place].

## 2015-05-01 NOTE — Progress Notes (Signed)
Patient's sister,Peggy notified of patient's transfer to [place]. Juanetta Negash, Wonda Cheng, Therapist, sports

## 2015-05-01 NOTE — Progress Notes (Signed)
PT placed back on bipap per increased WOB and pt request.

## 2015-05-01 NOTE — Progress Notes (Signed)
eLink Physician-Brief Progress Note Patient Name: Wanda Prince DOB: Dec 17, 1955 MRN: [id number]   Date of Service  05/01/2015  HPI/Events of Note  RN notified of altered mentation now on 100% saturation.  eICU Interventions  1. Start BiPAP immediately 2. Wean FiO2 to Sat 88-92% to minimize VQ mismatching 3. Continue nebs 4. Stat ABG     Intervention Category Major Interventions: Change in mental status - evaluation and management  Tera Partridge 05/01/2015, 8:40 PM

## 2015-05-01 NOTE — Progress Notes (Signed)
eLink Physician-Brief Progress Note Patient Name: [name] DOB: [birth year] MRN: [id number]   Date of Service  05/01/2015  HPI/Events of Note  Notified by RN of scattered areas of blistering on patient's back.   eICU Interventions  To be addressed by rounding team in AM. Requested she demarcate the areas with a skin marker.     Intervention Category Minor Interventions: Other:  Tera Partridge 05/01/2015, 6:37 PM

## 2015-05-01 NOTE — Progress Notes (Signed)
Patient transferred to 2H26 by Rapid response RNs and RT.Belongings sent with patient. Wanda Prince, Wonda Cheng, Therapist, sports

## 2015-05-01 NOTE — Significant Event (Signed)
Rapid Response Event Note  Overview: Time Called: 1907 Arrival Time: 1912 Event Type: Respiratory  Initial Focused Assessment:  Call received by Vincente Liberty, RN of pt in acute respiratory distress.  Pt was OOB to BR at 1730, returned to bed with slight SOB.  At 1830 pt c/o "I can't breathe".  Upon arrival to room pt noted to be in acute respiratory distress with increased WOB, use of accessory muscles and  bent over almost completely to bed, diaphoretic, with RR 38 HR 107 ST.  O2 sats 98% on 3l [place].  Pt had received one duoneb tx prior to my arrival. Almost no air movement bilaterally.  Initially denied chest pain but later c/o bilateral CP on inspiration rated 6/10.  Old trach site noted with oozing green secretions.  Temp 98.0 ax.  149/76 Pt alert, oriented to person, place and time but having periods of confusion..   Interventions:  Second duoneb treatment started by Prentiss Bells, RT                             CCM consulted with Georgann Housekeeper at bedside at [facility] hrs.                              Morphine Sulfate 1 mg IV given                               Placed on Ventimask 28% after duoneb completed                              Report given by Vincente Liberty, RN to [place], [place] and                               Pt transferred to [phone]. Hand off report to [place], Therapist, sports.  Will continue to support pt                                 as needed.  Family to be notified of transfer by Charlotte Sanes, RN  6E  Event Summary: Name of Physician Notified: Georgann Housekeeper, NP at [facility]    at    Outcome: Transferred (Comment) [phone] SDU)  Event End Time:  (2000)  Aloni Chuang, Gust Brooms

## 2015-05-01 NOTE — Progress Notes (Signed)
RT note: RT called to patient room for Rapid response. Patient acuti ly diminished and tri pod breathing. RT gave duoneb and patient stated that she was feeling better. Vital sigh stable at this time this time.

## 2015-05-01 NOTE — Progress Notes (Signed)
Patient currently resting on nasal cannula and tolerating well.  Will continue to monitor for needs for Bipap,

## 2015-05-01 NOTE — Progress Notes (Addendum)
[facility] PCCM PROGRESS NOTE  S:60 year old female admitted for recurrent AECOPD requiring intubation. She was extubated 12/30 but remained in ICU on intermittent BiPAP until she was transferred to tele 1/2.  1/2 PM she developed acute onset dyspnea shortly after ambulating to bathroom. Rapid response was called. She was in marked distress using accessory muscles. Albuterol neb was started which offered minimal improvement. Also c/o CP onset during this acute event, across entire anterior chest. Worse with deep inspiration.  O: BP 167/85 mmHg  Pulse 111  Temp(Src) 97.9 F (36.6 C) (Oral)  Resp 24  Ht '5\' 8"'$  (1.727 m)  Wt 51.3 kg (113 lb 1.5 oz)  BMI 17.20 kg/m2  SpO2 98%  General:  Cachectic female in moderate/severe distress  Neuro:  Alert, oriented, non-focal HEENT:  [place]/AT, PERRL, no JVD Cardiovascular:  Tachy Lungs:  Labored. Poor air movement. Faint end-expiratory wheeze. Abdomen:  Soft, non-tender, non-distended Musculoskeletal:  No acute deformity Skin:  Intact, MMM   A/P: COPD with acute exacerbation - STAT albuterol neb - '125mg'$  Solumedrol and schedule back to '60mg'$  q 8 hours. - Restart budesonide/brovana - may need to stay on this for longer term.  - SDU transfer - 1 mg morphine - EKG - Cycle troponin to r/o ACS, low suspicion  - Will defer ABG and BiPAP for now as O2 sats are high 90s and her mental status is intact. WOB is somewhat improving with nebs.  Georgann Housekeeper, ACNP [facility] Pulmonology/Critical Care Pager [phone] or [phone]

## 2015-05-01 NOTE — Progress Notes (Signed)
PULMONARY / CRITICAL CARE MEDICINE   Name: ANNABELLA ELFORD MRN: [id number] DOB: 18-Apr-1956    ADMISSION DATE:  04/22/2015 CONSULTATION DATE:  04/24/15  REFERRING MD:  Dr. Beryle Beams  CHIEF COMPLAINT:  Altered mental status, Hypercarbic respiratory failure   HISTORY OF PRESENT ILLNESS:   60 y/o F, smoker, with PMH of meningitis, VP shunt, seizure disorder, ETOH abuse / polysubstance abuse, depression with prior suicide attempts, IDA, and COPD with prior respiratory arrest & trach who presented to [facility] on 12/24 with shortness of breath.    SUBJECTIVE:  resp status is improved  VITAL SIGNS: BP 125/100 mmHg  Pulse 105  Temp(Src) 97.9 F (36.6 C) (Oral)  Resp 19  Ht '5\' 8"'$  (1.727 m)  Wt 52.1 kg (114 lb 13.8 oz)  BMI 17.47 kg/m2  SpO2 98%  HEMODYNAMICS:    VENTILATOR SETTINGS: Vent Mode:  [-] BIPAP FiO2 (%):  [40 %] 40 % Set Rate:  [12 bmp] 12 bmp  INTAKE / OUTPUT: I/O last 3 completed shifts: In: 660 [I.V.:350; IV Piggyback:310] Out: 3420 [Urine:3420]  PHYSICAL EXAMINATION: General:  No distress, following commands Neuro:  [place] nonfocal HEENT:  Mask in place, no jvd, old trach scar. Cardiovascular:  s1s2 rrr, no m/r/g Lungs:  Tachypnea resolved , diffuse wheeze bilaterally improved Abdomen:  Non-distended, soft, bsx4 active.  Musculoskeletal:  No acute deformities. Skin:  Thin, scattered bruises.  LABS:  BMET  Recent Labs Lab 04/29/15 0435 04/30/15 0445 05/01/15 0459  NA 143 140 142  K 4.5 4.2 3.9  CL 101 97* 99*  CO2 35* 35* 36*  BUN '18 10 10  '$ CREATININE 0.39* 0.44 0.45  GLUCOSE 126* 115* 148*   Electrolytes  Recent Labs Lab 04/26/15 0425 04/27/15 0429 04/28/15 0415 04/29/15 0435 04/30/15 0445 05/01/15 0459  CALCIUM 8.1* 8.2* 8.6* 8.5* 8.6* 8.5*  MG 2.1 2.2 1.9  --   --   --   PHOS 3.0 2.9 2.8  --   --   --    CBC  Recent Labs Lab 04/28/15 0415 04/30/15 0445 05/01/15 0459  WBC 8.2 9.2 9.4  HGB 12.9 13.3 13.2  HCT 40.5 40.5 41.2   PLT 99* 86* 105*   Coag's No results for input(s): APTT, INR in the last 168 hours.  Sepsis Markers No results for input(s): LATICACIDVEN, PROCALCITON, O2SATVEN in the last 168 hours.  ABG  Recent Labs Lab 04/26/15 1155 04/27/15 0312 04/28/15 0250  PHART 7.343* 7.396 7.449  PCO2ART 52.9* 51.8* 53.4*  PO2ART 77.6* 97.0 87.1   Liver Enzymes No results for input(s): AST, ALT, ALKPHOS, BILITOT, ALBUMIN in the last 168 hours.  Cardiac Enzymes No results for input(s): TROPONINI, PROBNP in the last 168 hours.  Glucose  Recent Labs Lab 04/27/15 2329 04/28/15 0318 04/28/15 0726 04/28/15 1254 04/28/15 1513 04/28/15 2022  GLUCAP 169* 146* 123* 147* 138* 91   Imaging No results found. STUDIES:  CXR I reviewed myself, hyperinflation.  CULTURES: None  ANTIBIOTICS: None  SIGNIFICANT EVENTS: 12/24  Admit with AECOPD 12/25  Agitation, CIWA 12/26  Hypercarbic respiratory failure on bipap Self extubation 28th, 28th reintubation, extubated 30th   LINES/TUBES: R [place] TLC 12/27>>>1/2 planned  DISCUSSION: 60 y/o F, current smoker, with PMH of COPD, prior tracheostomy who presented to [facility] on 12/24 with AECOPD.  Admitted per IMTS, developed agitated delirium and treated with CIWA protocol out of concern for ETOH withdrawal.  Developed worsening hypercarbic respiratory failure, treated with BiPAP & PCCM consulted 12/26.   ASSESSMENT / PLAN:  PULMONARY A: Acute Hypercarbic Respiratory Failure  Acute Exacerbation of COPD  Tobacco Abuse  Hx Prior Tracheostomy / respiratory arrest  P:   Decrease IV steroids, 40 mg BID, to pred 40 q12h Continue budesonide, adjust dosing to 0.5 BID Continue brovana BID  Adjusted duonebs to Q6, dc q2h as improved Nicotine patch dc if stays in icu  Allow autodiuresis  CARDIOVASCULAR A:  Tachycardia  P:  Monitor hemodynamics in ICU. Tele monitoring. ASA QD.  RENAL A:   No acute issues  P:   Dc line neck Dc foley KVO IVF. Allow  neg balance on own  GASTROINTESTINAL A:   At Risk For Aspiration  P:   PPI dc Diet   HEMATOLOGIC A:   Thrombocytopenia - mild ,improving with neg balance P:  Trend CBC as plat rise with neg balance on own Monitor for evidence of bleeding. Transfuse per ICU protocol.  INFECTIOUS A:   AECOPD  P:   Monitor fever curve / WBC. Of fabx  ENDOCRINE A:   No acute issues  P:   Monitor glucose on BMP. To pred  NEUROLOGIC A:   Agitated Delirium - concern for steroid induced Hx Polysubstance Abuse  Concern for ETOH withdrawal  P:   Continue thiamine / folate / MVI Re add home gabapentin  PT Headache, h/o SAH aneurysm, may need MRA  To floor   Lavon Paganini. Titus Mould, MD, [place] Pgr: [facility]

## 2015-05-01 NOTE — Progress Notes (Signed)
Pt taken off Bipap at her request and placed on N/C

## 2015-05-02 ENCOUNTER — Institutional Professional Consult (permissible substitution): Payer: Medicaid Other | Admitting: Pulmonary Disease

## 2015-05-02 LAB — BLOOD GAS, ARTERIAL
Acid-Base Excess: 7.8 mmol/L — ABNORMAL HIGH (ref 0.0–2.0)
Bicarbonate: 32.2 mEq/L — ABNORMAL HIGH (ref 20.0–24.0)
Delivery systems: POSITIVE
Drawn by: 270221
Expiratory PAP: 6
FIO2: 0.35
Inspiratory PAP: 12
O2 Saturation: 98.8 %
Patient temperature: 98.6
TCO2: 33.7 mmol/L (ref 0–100)
pCO2 arterial: 49.3 mmHg — ABNORMAL HIGH (ref 35.0–45.0)
pH, Arterial: 7.431 (ref 7.350–7.450)
pO2, Arterial: 131 mmHg — ABNORMAL HIGH (ref 80.0–100.0)

## 2015-05-02 LAB — PROCALCITONIN: Procalcitonin: <0.1 ng/mL

## 2015-05-02 MED ORDER — ENSURE ENLIVE PO LIQD
237.0000 mL | Freq: Three times a day (TID) | ORAL | Status: DC
  Administered 2015-05-02 – 2015-05-07 (×10): 237 mL via ORAL

## 2015-05-02 MED ORDER — LORAZEPAM 1 MG PO TABS
1.0000 mg | ORAL_TABLET | Freq: Four times a day (QID) | ORAL | Status: DC | PRN
  Administered 2015-05-02 – 2015-05-07 (×11): 1 mg via ORAL
  Filled 2015-05-02 (×11): qty 1

## 2015-05-02 MED ORDER — BUDESONIDE 0.25 MG/2ML IN SUSP
0.2500 mg | Freq: Four times a day (QID) | RESPIRATORY_TRACT | Status: DC
  Administered 2015-05-02 – 2015-05-07 (×20): 0.25 mg via RESPIRATORY_TRACT
  Filled 2015-05-02 (×21): qty 2

## 2015-05-02 NOTE — Progress Notes (Addendum)
Nutrition Follow-up  DOCUMENTATION CODES:   Underweight  INTERVENTION:   -Ensure Enlive po TID, each supplement provides 350 kcal and 20 grams of protein  NUTRITION DIAGNOSIS:   Inadequate oral intake related to poor appetite as evidenced by meal completion < 50%. Ongoing  GOAL:   Patient will meet greater than or equal to 90% of their needs  Unmet  MONITOR:   PO intake, Supplement acceptance, Labs, Weight trends, Skin, I & O's  REASON FOR ASSESSMENT:   Consult Enteral/tube feeding initiation and management  ASSESSMENT:   60 year old female with a past medical history of COPD, active tobacco abuse, ETOH and cocaine abuse, presents with worsening shortness of breath. Required intubation on 12/27.  Pt self-extubated on 04/26/15 and reintubated on the same date. Extubated on 04/28/15. She was transferred from ICU to medical floor on 05/01/15, however, rapid response was called due to respiratory distress and pt was subsequently transferred to SDU.  Pt was being evaluated by RT at time of visit. Spoke with RN, who reports pt is tolerating food well, however, intake is minimal secondary to poor appetite and increased work of breathing (meal completion 0-40%). RN confirmed that pt has been placed by on Bi-Pap.   RD will add supplements to optimize nutritional status given poor po intake and underweight status.   Labs reviewed.   Diet Order:  Diet regular Room service appropriate?: Yes; Fluid consistency:: Thin  Skin:  Reviewed, no issues  Last BM:  05/01/15  Height:   Ht Readings from Last 1 Encounters:  04/25/15 '5\' 8"'$  (1.727 m)    Weight:   Wt Readings from Last 1 Encounters:  05/01/15 113 lb 1.5 oz (51.3 kg)    Ideal Body Weight:  63.6 kg  BMI:  Body mass index is 17.2 kg/(m^2).  Estimated Nutritional Needs:   Kcal:  1500-1700  Protein:  65-80 grams  Fluid:  1.5-1.7  L  EDUCATION NEEDS:   No education needs identified at this time  Wanda Diana A.  Jimmye Norman, RD, LDN, CDE Pager: [phone] After hours Pager: [phone]

## 2015-05-02 NOTE — Progress Notes (Deleted)
Decreased from 8L

## 2015-05-02 NOTE — Progress Notes (Signed)
PULMONARY / CRITICAL CARE MEDICINE   Name: Wanda Prince MRN: [id number] DOB: [birth year]    ADMISSION DATE:  04/22/2015 CONSULTATION DATE:  04/24/15  REFERRING MD:  Dr. Beryle Beams  CHIEF COMPLAINT:  Altered mental status, Hypercarbic respiratory failure   HISTORY OF PRESENT ILLNESS:   60 y/o F, smoker, with PMH of meningitis, VP shunt, seizure disorder, ETOH abuse / polysubstance abuse, depression with prior suicide attempts, IDA, and COPD with prior respiratory arrest & trach who presented to [facility] on 12/24 with shortness of breath.    SUBJECTIVE:  Brought back to SDU for increased WOB on floor. Placed on Bipap.   VITAL SIGNS: BP 139/126 mmHg  Pulse 96  Temp(Src) 97.8 F (36.6 C) (Oral)  Resp 24  Ht '5\' 8"'$  (1.727 m)  Wt 113 lb 1.5 oz (51.3 kg)  BMI 17.20 kg/m2  SpO2 98%  HEMODYNAMICS:    VENTILATOR SETTINGS: Vent Mode:  [-]  FiO2 (%):  [28 %-40 %] 40 %  INTAKE / OUTPUT: I/O last 3 completed shifts: In: 696 [P.O.:290; I.V.:160; IV Piggyback:205] Out: 1675 [Urine:1675]  PHYSICAL EXAMINATION: General:  Mild distress, increased WOB Neuro:  PERRL, Moves all extremities, No gross focal deficits. Cardiovascular:  s1s2 rrr, no m/r/g Lungs:  Distant breath sounds, No wheeze or crackles. Abdomen:  Non-distended, soft, + BS Musculoskeletal:  No acute deformities. Skin:  Thin, scattered bruises.  LABS:  BMET  Recent Labs Lab 04/29/15 0435 04/30/15 0445 05/01/15 0459  NA 143 140 142  K 4.5 4.2 3.9  CL 101 97* 99*  CO2 35* 35* 36*  BUN '18 10 10  '$ CREATININE 0.39* 0.44 0.45  GLUCOSE 126* 115* 148*   Electrolytes  Recent Labs Lab 04/26/15 0425 04/27/15 0429 04/28/15 0415 04/29/15 0435 04/30/15 0445 05/01/15 0459  CALCIUM 8.1* 8.2* 8.6* 8.5* 8.6* 8.5*  MG 2.1 2.2 1.9  --   --   --   PHOS 3.0 2.9 2.8  --   --   --    CBC  Recent Labs Lab 04/28/15 0415 04/30/15 0445 05/01/15 0459  WBC 8.2 9.2 9.4  HGB 12.9 13.3 13.2  HCT 40.5 40.5 41.2  PLT 99*  86* 105*   Coag's No results for input(s): APTT, INR in the last 168 hours.  Sepsis Markers No results for input(s): LATICACIDVEN, PROCALCITON, O2SATVEN in the last 168 hours.  ABG  Recent Labs Lab 04/27/15 0312 04/28/15 0250 05/01/15 2052  PHART 7.396 7.449 7.384  PCO2ART 51.8* 53.4* 65.7*  PO2ART 97.0 87.1 85.0   Liver Enzymes No results for input(s): AST, ALT, ALKPHOS, BILITOT, ALBUMIN in the last 168 hours.  Cardiac Enzymes No results for input(s): TROPONINI, PROBNP in the last 168 hours.  Glucose  Recent Labs Lab 04/27/15 2329 04/28/15 0318 04/28/15 0726 04/28/15 1254 04/28/15 1513 04/28/15 2022  GLUCAP 169* 146* 123* 147* 138* 91   Imaging No results found. STUDIES:  1/3 No new imaging  CULTURES: None  ANTIBIOTICS: None  SIGNIFICANT EVENTS: 12/24  Admit with AECOPD 12/25  Agitation, CIWA 12/26  Hypercarbic respiratory failure on bipap Self extubation 28th, 28th reintubation, extubated 30th  1/3 Transfer to ICU with increased WOB  LINES/TUBES: R [place] TLC 12/27>>>1/2 planned  DISCUSSION: 60 y/o F, current smoker, with PMH of COPD, prior tracheostomy who presented to [facility] on 12/24 with AECOPD.  Admitted per IMTS, developed agitated delirium and treated with CIWA protocol out of concern for ETOH withdrawal.  Developed worsening hypercarbic respiratory failure, treated with BiPAP.  ASSESSMENT / PLAN:  PULMONARY A: Acute Hypercarbic Respiratory Failure  Acute Exacerbation of COPD  Tobacco Abuse  Hx Prior Tracheostomy / respiratory arrest  P:   Now back on increased steroids. Solumedrol 60 mg IV q8 Use Bipap as needed during the day and mandatory at night Recheck ABG. Continue budesonide, brovana. Duonebs q4 hrs Nicotine patch. Check procalcitonin and resp virus panel.  CARDIOVASCULAR A:  Tachycardia  P:  Monitor hemodynamics in SDU Tele monitoring. ASA QD.  RENAL A:   No acute issues  P:   Dc line neck Dc foley KVO IVF. Allow  neg balance on own  GASTROINTESTINAL A:   At Risk For Aspiration  P:   PPI dc Diet   HEMATOLOGIC A:   Thrombocytopenia - mild ,improving with neg balance P:  Trend CBC as plat rise with neg balance on own Monitor for evidence of bleeding. Transfuse per ICU protocol.  INFECTIOUS A:   AECOPD  P:   Monitor fever curve / WBC. Of fabx  ENDOCRINE A:   No acute issues  P:   Monitor glucose on BMP. To pred  NEUROLOGIC A:   Agitated Delirium - concern for steroid induced Hx Polysubstance Abuse  Concern for ETOH withdrawal  P:   Continue thiamine / folate / MVI Re add home gabapentin  PT Headache, h/o SAH aneurysm, may need MRA  Critical care time- 45 mins.  Marshell Garfinkel MD [facility] Pager [phone] If no answer or after 3pm call: [phone] 05/02/2015, 9:59 AM

## 2015-05-03 ENCOUNTER — Inpatient Hospital Stay (HOSPITAL_COMMUNITY): Payer: Medicaid Other

## 2015-05-03 LAB — BASIC METABOLIC PANEL
Anion gap: 7 (ref 5–15)
BUN: 13 mg/dL (ref 6–20)
CO2: 36 mmol/L — ABNORMAL HIGH (ref 22–32)
Calcium: 8.9 mg/dL (ref 8.9–10.3)
Chloride: 98 mmol/L — ABNORMAL LOW (ref 101–111)
Creatinine, Ser: <0.3 mg/dL — ABNORMAL LOW (ref 0.44–1.00)
Glucose, Bld: 139 mg/dL — ABNORMAL HIGH (ref 65–99)
Potassium: 4.6 mmol/L (ref 3.5–5.1)
Sodium: 141 mmol/L (ref 135–145)

## 2015-05-03 LAB — POCT I-STAT 3, ART BLOOD GAS (G3+)
Acid-Base Excess: 12 mmol/L — ABNORMAL HIGH (ref 0.0–2.0)
Bicarbonate: 37.7 mEq/L — ABNORMAL HIGH (ref 20.0–24.0)
O2 Saturation: 99 %
Patient temperature: 97.1
TCO2: 39 mmol/L (ref 0–100)
pCO2 arterial: 50.6 mmHg — ABNORMAL HIGH (ref 35.0–45.0)
pH, Arterial: 7.478 — ABNORMAL HIGH (ref 7.350–7.450)
pO2, Arterial: 111 mmHg — ABNORMAL HIGH (ref 80.0–100.0)

## 2015-05-03 LAB — CBC
HCT: 41.4 % (ref 36.0–46.0)
Hemoglobin: 13.3 g/dL (ref 12.0–15.0)
MCH: 31.1 pg (ref 26.0–34.0)
MCHC: 32.1 g/dL (ref 30.0–36.0)
MCV: 96.7 fL (ref 78.0–100.0)
Platelets: 122 10*3/uL — ABNORMAL LOW (ref 150–400)
RBC: 4.28 MIL/uL (ref 3.87–5.11)
RDW: 13.5 % (ref 11.5–15.5)
WBC: 18.3 10*3/uL — ABNORMAL HIGH (ref 4.0–10.5)

## 2015-05-03 LAB — PHOSPHORUS: Phosphorus: 3.4 mg/dL (ref 2.5–4.6)

## 2015-05-03 LAB — MAGNESIUM: Magnesium: 2 mg/dL (ref 1.7–2.4)

## 2015-05-03 LAB — PROCALCITONIN: Procalcitonin: <0.1 ng/mL

## 2015-05-03 MED ORDER — ALUM & MAG HYDROXIDE-SIMETH 200-200-20 MG/5ML PO SUSP
15.0000 mL | Freq: Once | ORAL | Status: AC
  Administered 2015-05-03: 15 mL via ORAL
  Filled 2015-05-03: qty 30

## 2015-05-03 MED ORDER — METHYLPREDNISOLONE SODIUM SUCC 125 MG IJ SOLR
60.0000 mg | Freq: Two times a day (BID) | INTRAMUSCULAR | Status: DC
  Administered 2015-05-03 – 2015-05-05 (×4): 60 mg via INTRAVENOUS
  Filled 2015-05-03 (×4): qty 2

## 2015-05-03 NOTE — Progress Notes (Signed)
PULMONARY / CRITICAL CARE MEDICINE   Name: Wanda Prince MRN: [id number] DOB: [birth year]    ADMISSION DATE:  04/22/2015 CONSULTATION DATE:  04/24/15  REFERRING MD:  Dr. Beryle Beams  CHIEF COMPLAINT:  Altered mental status, Hypercarbic respiratory failure   HISTORY OF PRESENT ILLNESS:   60 y/o F, smoker, with PMH of meningitis, VP shunt, seizure disorder, ETOH abuse / polysubstance abuse, depression with prior suicide attempts, IDA, and COPD with prior respiratory arrest & trach who presented to [facility] on 12/24 with shortness of breath.    SUBJECTIVE:  Brought back to SDU on 1/2 for increased WOB on floor and placed on Bipap.    Required Bipap for ~1 hr last PM.  Currently satting 100% on 6L [place].   VITAL SIGNS: BP 137/78 mmHg  Pulse 90  Temp(Src) 97.1 F (36.2 C) (Oral)  Resp 18  Ht '5\' 8"'$  (1.727 m)  Wt 51.3 kg (113 lb 1.5 oz)  BMI 17.20 kg/m2  SpO2 100%  HEMODYNAMICS:    VENTILATOR SETTINGS: Vent Mode:  [-]  FiO2 (%):  [35 %-40 %] 40 %  INTAKE / OUTPUT: I/O last 3 completed shifts: In: 887 [P.O.:887] Out: 5027 [Urine:1245; Stool:300]  PHYSICAL EXAMINATION: General:  NAD, sleeping.  Cachetic.  Neuro:  Moves all extremities, no gross focal deficits. Cardiovascular:Distant heart sounds, RRR, no m/r/g Lungs:  Distant breath sounds, no wheeze or crackles. Abdomen:  Non-distended, soft, + BS Musculoskeletal:  No acute deformities. Skin:  Thin, scattered bruises.  LABS:  BMET  Recent Labs Lab 04/30/15 0445 05/01/15 0459 05/03/15 0240  NA 140 142 141  K 4.2 3.9 4.6  CL 97* 99* 98*  CO2 35* 36* 36*  BUN '10 10 13  '$ CREATININE 0.44 0.45 <0.30*  GLUCOSE 115* 148* 139*   Electrolytes  Recent Labs Lab 04/27/15 0429 04/28/15 0415  04/30/15 0445 05/01/15 0459 05/03/15 0240  CALCIUM 8.2* 8.6*  < > 8.6* 8.5* 8.9  MG 2.2 1.9  --   --   --  2.0  PHOS 2.9 2.8  --   --   --  3.4  < > = values in this interval not displayed. CBC  Recent Labs Lab 04/30/15 0445  05/01/15 0459 05/03/15 0240  WBC 9.2 9.4 18.3*  HGB 13.3 13.2 13.3  HCT 40.5 41.2 41.4  PLT 86* 105* 122*   Coag's No results for input(s): APTT, INR in the last 168 hours.  Sepsis Markers  Recent Labs Lab 05/02/15 1052 05/03/15 0240  PROCALCITON <0.10 <0.10    ABG  Recent Labs Lab 05/01/15 2052 05/02/15 1049 05/03/15 0348  PHART 7.384 7.431 7.478*  PCO2ART 65.7* 49.3* 50.6*  PO2ART 85.0 131.0* 111.0*   Liver Enzymes No results for input(s): AST, ALT, ALKPHOS, BILITOT, ALBUMIN in the last 168 hours.  Cardiac Enzymes No results for input(s): TROPONINI, PROBNP in the last 168 hours.  Glucose  Recent Labs Lab 04/27/15 2329 04/28/15 0318 04/28/15 0726 04/28/15 1254 04/28/15 1513 04/28/15 2022  GLUCAP 169* 146* 123* 147* 138* 91   Imaging Dg Chest Port 1 View  05/03/2015  CLINICAL DATA:  Acute respiratory failure, history of [place] OPD, intracranial hemorrhage, previous MI, current smoker. EXAM: PORTABLE CHEST 1 VIEW COMPARISON:  Portable chest x-ray of April 30, 2015 FINDINGS: The lungs are mildly hyperinflated and clear. The minimal increased density in the left lower hemi thorax has cleared. The heart and pulmonary vascularity are normal. The mediastinum is normal in width. The thoracic portion of the VP shunt tube  is stable. The right subclavian venous catheter has been removed. IMPRESSION: COPD.  There is no acute cardiopulmonary abnormality. Electronically Signed   By: David  Martinique M.D.   On: 05/03/2015 07:26   STUDIES:  1/3 No new imaging  CULTURES: None  ANTIBIOTICS: None  SIGNIFICANT EVENTS: 12/24  Admit with AECOPD 12/25  Agitation, CIWA 12/26  Hypercarbic respiratory failure on bipap Self extubation 28th, 28th reintubation, extubated 30th  1/3 Transfer to ICU with increased WOB 1/4 Transfer to IMTS   LINES/TUBES: R [place] TLC 12/27>>>1/2 planned  DISCUSSION: 60 y/o F, current smoker (states she quit 2 wks ago), with PMH of COPD, prior  tracheostomy who presented to [facility] on 12/24 with AECOPD.  Admitted per IMTS, developed agitated delirium and treated with CIWA protocol out of concern for ETOH withdrawal.  Developed worsening hypercarbic respiratory failure, treated with BiPAP.  ASSESSMENT / PLAN:  PULMONARY A: Acute Hypercarbic Respiratory Failure  Acute Exacerbation of COPD -PCT <0.10 Tobacco Abuse  Hx Prior Tracheostomy / respiratory arrest  P:   Decrease Solumedrol 60 mg IV q12 Bipap PRN  Continue budesonide, brovana. Duonebs q4 hrs Nicotine patch. RVP pending  Wean O2 as tolerated   CARDIOVASCULAR A:  Tachycardia  P:  Monitor hemodynamics in SDU Tele monitoring. ASA QD.  RENAL A:   No acute issues  P:   KVO IVF. Allow neg balance on own  GASTROINTESTINAL A:   At Risk For Aspiration Inadequate oral intake   P:   PPI dc Diet  Nutrition following - ensure enlive po tid    HEMATOLOGIC A:   Thrombocytopenia - mild ,improving with neg balance Leukocytosis - likely steroid induced  P:  Trend CBC as plat rise with neg balance on own Monitor for evidence of bleeding. Transfuse per ICU protocol.  INFECTIOUS A:   AECOPD - PCT <0.10 P:   Monitor fever curve / WBC. No abx  ENDOCRINE A:   No acute issues  P:   Monitor glucose on BMP. To pred  NEUROLOGIC A:   Agitated Delirium - concern for steroid induced Hx Polysubstance Abuse  Concern for ETOH withdrawal  P:   Continue thiamine / folate / MVI Re add home gabapentin  PT Headache, h/o SAH aneurysm, may need MRA  Should be able to transfer back to IMTS today.   Michail Jewels, MD Internal Medicine, PGY-3  05/03/2015, 7:54 AM

## 2015-05-03 NOTE — Progress Notes (Signed)
eLink Physician-Brief Progress Note Patient Name: Wanda Prince DOB: Oct 17, 1955 MRN: [id number]   Date of Service  05/03/2015  HPI/Events of Note  RN reports abdominal distension. No nausea. Did have small BM today.  eICU Interventions  1. KUB 2. Maalox     Intervention Category Intermediate Interventions: Pain - evaluation and management  Tera Partridge 05/03/2015, 3:37 PM

## 2015-05-03 NOTE — Progress Notes (Signed)
Pt's HR increased to 150-160 at ~1230. Pt in bed at rest, A&O per baseline, BP 133/43. STAT EKG ordered. Pt's HR 110-120 at time of EKG. EKG resulted as ST. Dr. Vaughan Browner notified. Verbal orders for 500cc Bolus given. Will continue to monitor pt closely.

## 2015-05-04 DIAGNOSIS — R51 Headache: Secondary | ICD-10-CM

## 2015-05-04 DIAGNOSIS — Z930 Tracheostomy status: Secondary | ICD-10-CM

## 2015-05-04 LAB — RESPIRATORY VIRUS PANEL
Adenovirus: NEGATIVE
Influenza A: NEGATIVE
Influenza B: NEGATIVE
Metapneumovirus: NEGATIVE
Parainfluenza 1: NEGATIVE
Parainfluenza 2: NEGATIVE
Parainfluenza 3: NEGATIVE
Respiratory Syncytial Virus A: NEGATIVE
Respiratory Syncytial Virus B: NEGATIVE
Rhinovirus: NEGATIVE

## 2015-05-04 LAB — BASIC METABOLIC PANEL
Anion gap: 7 (ref 5–15)
BUN: 22 mg/dL — ABNORMAL HIGH (ref 6–20)
CO2: 36 mmol/L — ABNORMAL HIGH (ref 22–32)
Calcium: 8.5 mg/dL — ABNORMAL LOW (ref 8.9–10.3)
Chloride: 98 mmol/L — ABNORMAL LOW (ref 101–111)
Creatinine, Ser: <0.3 mg/dL — ABNORMAL LOW (ref 0.44–1.00)
Glucose, Bld: 128 mg/dL — ABNORMAL HIGH (ref 65–99)
Potassium: 4.3 mmol/L (ref 3.5–5.1)
Sodium: 141 mmol/L (ref 135–145)

## 2015-05-04 LAB — MAGNESIUM: Magnesium: 2 mg/dL (ref 1.7–2.4)

## 2015-05-04 LAB — PHOSPHORUS: Phosphorus: 3 mg/dL (ref 2.5–4.6)

## 2015-05-04 LAB — CBC
HCT: 40.4 % (ref 36.0–46.0)
Hemoglobin: 13 g/dL (ref 12.0–15.0)
MCH: 31 pg (ref 26.0–34.0)
MCHC: 32.2 g/dL (ref 30.0–36.0)
MCV: 96.4 fL (ref 78.0–100.0)
Platelets: 122 10*3/uL — ABNORMAL LOW (ref 150–400)
RBC: 4.19 MIL/uL (ref 3.87–5.11)
RDW: 13.3 % (ref 11.5–15.5)
WBC: 15.7 10*3/uL — ABNORMAL HIGH (ref 4.0–10.5)

## 2015-05-04 LAB — PROCALCITONIN: Procalcitonin: 0.12 ng/mL

## 2015-05-04 MED ORDER — IPRATROPIUM-ALBUTEROL 0.5-2.5 (3) MG/3ML IN SOLN
3.0000 mL | RESPIRATORY_TRACT | Status: DC
  Administered 2015-05-04 – 2015-05-05 (×6): 3 mL via RESPIRATORY_TRACT
  Filled 2015-05-04 (×6): qty 3

## 2015-05-04 MED ORDER — DOCUSATE SODIUM 100 MG PO CAPS
200.0000 mg | ORAL_CAPSULE | Freq: Two times a day (BID) | ORAL | Status: DC
  Administered 2015-05-04 – 2015-05-07 (×7): 200 mg via ORAL
  Filled 2015-05-04 (×7): qty 2

## 2015-05-04 MED ORDER — ENOXAPARIN SODIUM 40 MG/0.4ML ~~LOC~~ SOLN
40.0000 mg | SUBCUTANEOUS | Status: DC
  Administered 2015-05-04 – 2015-05-06 (×3): 40 mg via SUBCUTANEOUS
  Filled 2015-05-04 (×3): qty 0.4

## 2015-05-04 NOTE — Progress Notes (Signed)
TRANSFER NOTE  60 Y O F with PMH of COPD, hx of tracheostomy, Tobacco abuse, seizures, CHF- EF- 60-65%, CAD, HTN, amitted - 11/24 and was managed for COPD exacerbation, developed agitation thought to be due to ethanol withdrawal and so was placed on CIWA protocol, given ativan, became somnolent and then developed hypercarbic respiratory failure,  Was intubated  was transferred to the ICU. She was continued on CIWA protocol and started on Precedex, which was discontinued on 12/27. She self-extubated on 12/30 and had a slow recovery requiring BiPAP on and off, and she was transferred back to stepdown for increased work of breathing. Of note, her pharmacologic DVT prophylaxis (heparin) was discontinued on 12/29 due to thrombocytopenia, down to 95 from her baseline of 110-140, using SCDs only. She had an episode of acute onset dyspnea on 1/2. Her IV solumedrol was weaned to 60 mg IV BID and she was continued on budenoside and arformoterol nebulizer. By the time of transfer to IMTS, she was not requiring any prn ativan per CIWA protocol.   This morning, she reported some dyspnea and "not being able to get a full breath." She reported being motivated to stop drinking and stop smoking. She reported a good appetite and was eating her breakfast. Other than a headache, she had no other complaints.  CBC Latest Ref Rng 05/04/2015 05/03/2015 05/01/2015  WBC 4.0 - 10.5 K/uL 15.7(H) 18.3(H) 9.4  Hemoglobin 12.0 - 15.0 g/dL 13.0 13.3 13.2  Hematocrit 36.0 - 46.0 % 40.4 41.4 41.2  Platelets 150 - 400 K/uL 122(L) 122(L) 105(L)    BMET    Component Value Date/Time   NA 141 05/04/2015 0231   K 4.3 05/04/2015 0231   CL 98* 05/04/2015 0231   CO2 36* 05/04/2015 0231   GLUCOSE 128* 05/04/2015 0231   BUN 22* 05/04/2015 0231   CREATININE <0.30* 05/04/2015 0231   CALCIUM 8.5* 05/04/2015 0231   GFRNONAA NOT CALCULATED 05/04/2015 0231   GFRAA NOT  CALCULATED 05/04/2015 0231    Filed Vitals:   05/04/15 1300 05/04/15 1400 05/04/15 1440 05/04/15 1500  BP: 169/95 151/96  130/97  Pulse: 116 114  116  Temp:      TempSrc:      Resp: '25 23  28  '$ Height:      Weight:      SpO2: 95% 96% 97% 96%   Physical Exam: General: Lying in bed, NAD, eating her breakfast.  HEENT: Shunt apparatus visualized. Moist mucous membranes, no cervical adenopathy, EOMI, PERRL Cardiovascular: Tachycardic but regular, no m/r/g Pulmonary: Scattered wheezes. Diminished air movement.  Abdominal: Soft NT/ND. Normal bowel sounds Extremities: No clubbing cyanosis or edema Skin: Warm and dry. Neurological: AAOx4. No tremors.    Current facility-administered medications:  .  0.9 %  sodium chloride infusion, , Intravenous, Continuous, Rush Farmer, MD, Stopped at 05/04/15 1600 .  acetaminophen (TYLENOL) tablet 650 mg, 650 mg, Oral, Q6H PRN, 650 mg at 05/04/15 0949 **OR** acetaminophen (TYLENOL) suppository 650 mg, 650 mg, Rectal, Q4H PRN, Chesley Mires, MD .  albuterol (PROVENTIL) (2.5 MG/3ML) 0.083% nebulizer solution 2.5 mg, 2.5 mg, Nebulization, Q2H PRN, Juliet Rude, MD, 2.5 mg at 04/30/15 2112 .  antiseptic oral rinse (CPC / CETYLPYRIDINIUM CHLORIDE 0.05%) solution 7 mL, 7 mL, Mouth Rinse, q12n4p, Annia Belt, MD, 7 mL at 05/03/15 1556 .  aspirin chewable tablet 81 mg, 81 mg, Oral, Daily, Norman Herrlich, MD, 81 mg at 05/04/15 0949 .  B-complex with vitamin C tablet 1 tablet,  1 tablet, Oral, BID, Annia Belt, MD, 1 tablet at 05/04/15 [phone]  budesonide (PULMICORT) nebulizer solution 0.25 mg, 0.25 mg, Nebulization, Q6H, Annia Belt, MD, 0.25 mg at 05/04/15 1440 .  chlorhexidine (PERIDEX) 0.12 % solution 15 mL, 15 mL, Mouth Rinse, BID, Annia Belt, MD, 15 mL at 05/04/15 0951 .  cholecalciferol (VITAMIN D) tablet 1,000 Units, 1,000 Units, Oral, Daily, Norman Herrlich, MD, 1,000 Units at 05/04/15 [phone] .  cyclobenzaprine (FLEXERIL)  tablet 5 mg, 5 mg, Oral, TID PRN, Zada Finders, MD, 5 mg at 04/27/15 1954 .  docusate sodium (COLACE) capsule 200 mg, 200 mg, Oral, BID, Norman Herrlich, MD, 200 mg at 05/04/15 1417 .  enoxaparin (LOVENOX) injection 40 mg, 40 mg, Subcutaneous, Q24H, Leodis Sias, RPH .  feeding supplement (ENSURE ENLIVE) (ENSURE ENLIVE) liquid 237 mL, 237 mL, Oral, TID BM, Jenifer A Williams, RD, 237 mL at 05/04/15 0948 .  gabapentin (NEURONTIN) capsule 300 mg, 300 mg, Oral, QHS, Norman Herrlich, MD, 300 mg at 05/03/15 2151 .  ipratropium-albuterol (DUONEB) 0.5-2.5 (3) MG/3ML nebulizer solution 3 mL, 3 mL, Nebulization, Q4H, Norman Herrlich, MD .  levETIRAcetam (KEPPRA) tablet 500 mg, 500 mg, Oral, BID, Raylene Miyamoto, MD, 500 mg at 05/04/15 0949 .  LORazepam (ATIVAN) tablet 1 mg, 1 mg, Oral, Q6H PRN, Praveen Mannam, MD, 1 mg at 05/03/15 1556 .  methylPREDNISolone sodium succinate (SOLU-MEDROL) 125 mg/2 mL injection 60 mg, 60 mg, Intravenous, Q12H, Praveen Mannam, MD, 60 mg at 05/04/15 7169 .  nicotine (NICODERM CQ - dosed in mg/24 hours) patch 14 mg, 14 mg, Transdermal, Daily, Chesley Mires, MD, 14 mg at 05/04/15 0951 .  ondansetron (ZOFRAN) injection 4 mg, 4 mg, Intravenous, Q6H PRN, Tammy S Parrett, NP, 4 mg at 05/01/15 0854  Assessment/Plan:  COPD Exacerbation: Patient is still requireing BiPAP at night. I spoke with Dr. Vaughan Browner from Pulmonology who reported that she would only qualify for BiPAP at night if she got a sleep study; however, she does not have a history consistent with OSA, OHS. She continues to be weaned off of oxygen, and is currently satting >95% on 4LNC. She will likely require oxygen at home. - Budenoside nebulizer q6h - Duoneb q4h - Albuterol nebulazier q2h prn - Discontinued arformoterol - BiPAP qHS prn - Solumedrol 60 mg BID IV, transition to prednisone 60 mg BID tomorrow, then decrease by 10 mg every 3 days.  Sinus Tachycardia: Could be multifactorial: anxiety, dyspnea, receiving both SABA  and LABA. Another consideration is that she has been off of pharmacologic DVT prophylaxis since 12/29 and was extubated on 12/30. She also had an episode of acute dyspnea on 1/2.  - If her tachycardia and hypoxia do not resolve in next 24-48 hours, then consider V/Q scan  History of Subarachnoid Hemorrhage: Has a persistent headache. If this does not resolve with resolution of hypoxia, consider MRA - Consider MRA  Seizure Disorder: No seizures this hospitalization. - Keppra 500 mg BID  Tobacco Abuse: Motivated to quit.  - 14 mg patch - Provide gum on discharge.   Alcohol Withdrawal: Has been off CIWA protocol - Consider Naltrexone on discharge - Continue Ativan prn anxiety - Continue thiamine and vitamines  DVT Prophylaxis: Lovenox [place]  Dispo: Disposition is deferred at this time, awaiting improvement of current medical problems.  Anticipated discharge in approximately 4-5 day(s).   The patient does have a current PCP Saralyn Pilar, MD) and does not need an [facility] hospital  follow-up appointment after discharge.  The patient does have transportation limitations that hinder transportation to clinic appointments.  .Services Needed at time of discharge: Y = Yes, Blank = No PT:   OT:   RN:   Equipment:   Other:     LOS: 11 days   Liberty Handy, MD 05/04/2015, 4:08 PM

## 2015-05-04 NOTE — Progress Notes (Signed)
Patient complaining of constipation and abdominal distention again today. Colace ordered and given with no relief.  Patient is now very anxious and diaphoretic, stating "constipation is making her short of breath". Hulen Luster, MD aware, orders given for Tap water enema. Will continue to monitor.

## 2015-05-04 NOTE — Progress Notes (Signed)
ANTICOAGULATION CONSULT NOTE - Initial Consult  Pharmacy Consult for lovenox Indication: VTE prophylaxis  No Known Allergies  Patient Measurements: Height: '5\' 8"'$  (172.7 cm) Weight: 113 lb 1.5 oz (51.3 kg) IBW/kg (Calculated) : 63.9  Vital Signs: Temp: 97 F (36.1 C) (01/05 1600) Temp Source: Oral (01/05 1600) BP: 150/91 mmHg (01/05 1600) Pulse Rate: 112 (01/05 1600)  Labs:  Recent Labs  05/03/15 0240 05/04/15 0231  HGB 13.3 13.0  HCT 41.4 40.4  PLT 122* 122*  CREATININE <0.30* <0.30*    CrCl cannot be calculated (Patient has no serum creatinine result on file.).   Medical History: Past Medical History  Diagnosis Date  . Meningitis 2009  . Hx of ventricular shunt 2009  . Seizure disorder ([place])   . Respiratory arrest ([place])     secondary to Subependymal abscess  . 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 Suicide attempt [facility])     History of suicide attempts August 2006 and July 2006  . History of alcohol abuse   . History of cocaine abuse   . Major depressive disorder ([place])     History of  . History of iron deficiency   . COPD (chronic obstructive pulmonary disease) ([place])   . History of acute alcoholic hepatitis   . Psychiatric diagnosis     Multiple  . 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 Left breast mass    Assessment: 48 YOF has been in the hospital since 12/24 for acute respiratory failure and EtOH withdrawal. pharmacologic DVT prophylaxis (heparin) was discontinued on 12/29 due to thrombocytopenia. Now plt 122, will start lovenox for VTE px. hgb 13, scr < 0.3   Goal of Therapy:  Monitor platelets by anticoagulation protocol: Yes   Plan:  - Lovenox 40 mg sq q 24 hrs - Pharmacy sign off  Thanks!  Maryanna Shape, PharmD, BCPS  Clinical Pharmacist  Pager: [phone]   05/04/2015,5:03 PM

## 2015-05-05 ENCOUNTER — Inpatient Hospital Stay (HOSPITAL_COMMUNITY): Payer: Medicaid Other

## 2015-05-05 ENCOUNTER — Encounter (HOSPITAL_COMMUNITY): Payer: Self-pay | Admitting: Radiology

## 2015-05-05 DIAGNOSIS — G40909 Epilepsy, unspecified, not intractable, without status epilepticus: Secondary | ICD-10-CM

## 2015-05-05 DIAGNOSIS — F17200 Nicotine dependence, unspecified, uncomplicated: Secondary | ICD-10-CM

## 2015-05-05 DIAGNOSIS — R918 Other nonspecific abnormal finding of lung field: Secondary | ICD-10-CM

## 2015-05-05 DIAGNOSIS — Z8679 Personal history of other diseases of the circulatory system: Secondary | ICD-10-CM

## 2015-05-05 DIAGNOSIS — R000 Tachycardia, unspecified: Secondary | ICD-10-CM

## 2015-05-05 DIAGNOSIS — F10239 Alcohol dependence with withdrawal, unspecified: Secondary | ICD-10-CM

## 2015-05-05 LAB — CBC
HCT: 42.3 % (ref 36.0–46.0)
Hemoglobin: 13.5 g/dL (ref 12.0–15.0)
MCH: 31 pg (ref 26.0–34.0)
MCHC: 31.9 g/dL (ref 30.0–36.0)
MCV: 97.2 fL (ref 78.0–100.0)
Platelets: 113 10*3/uL — ABNORMAL LOW (ref 150–400)
RBC: 4.35 MIL/uL (ref 3.87–5.11)
RDW: 13.4 % (ref 11.5–15.5)
WBC: 14.2 10*3/uL — ABNORMAL HIGH (ref 4.0–10.5)

## 2015-05-05 LAB — BASIC METABOLIC PANEL
Anion gap: 8 (ref 5–15)
BUN: 19 mg/dL (ref 6–20)
CO2: 34 mmol/L — ABNORMAL HIGH (ref 22–32)
Calcium: 8.8 mg/dL — ABNORMAL LOW (ref 8.9–10.3)
Chloride: 99 mmol/L — ABNORMAL LOW (ref 101–111)
Creatinine, Ser: 0.36 mg/dL — ABNORMAL LOW (ref 0.44–1.00)
GFR calc Af Amer: >60 mL/min (ref >60)
GFR calc non Af Amer: >60 mL/min (ref >60)
Glucose, Bld: 117 mg/dL — ABNORMAL HIGH (ref 65–99)
Potassium: 4.5 mmol/L (ref 3.5–5.1)
Sodium: 141 mmol/L (ref 135–145)

## 2015-05-05 LAB — GLUCOSE, CAPILLARY
Glucose-Capillary: 115 mg/dL — ABNORMAL HIGH (ref 65–99)
Glucose-Capillary: 122 mg/dL — ABNORMAL HIGH (ref 65–99)
Glucose-Capillary: 148 mg/dL — ABNORMAL HIGH (ref 65–99)

## 2015-05-05 MED ORDER — PREDNISONE 50 MG PO TABS
60.0000 mg | ORAL_TABLET | Freq: Two times a day (BID) | ORAL | Status: DC
  Administered 2015-05-05 – 2015-05-07 (×5): 60 mg via ORAL
  Filled 2015-05-05 (×7): qty 1

## 2015-05-05 MED ORDER — IOHEXOL 350 MG/ML SOLN
100.0000 mL | Freq: Once | INTRAVENOUS | Status: AC | PRN
  Administered 2015-05-05: 100 mL via INTRAVENOUS

## 2015-05-05 MED ORDER — POLYETHYLENE GLYCOL 3350 17 G PO PACK
17.0000 g | PACK | Freq: Every day | ORAL | Status: DC
  Administered 2015-05-05 – 2015-05-07 (×3): 17 g via ORAL
  Filled 2015-05-05 (×3): qty 1

## 2015-05-05 MED ORDER — IPRATROPIUM-ALBUTEROL 0.5-2.5 (3) MG/3ML IN SOLN
3.0000 mL | Freq: Two times a day (BID) | RESPIRATORY_TRACT | Status: DC
  Administered 2015-05-06 – 2015-05-07 (×3): 3 mL via RESPIRATORY_TRACT
  Filled 2015-05-05 (×3): qty 3

## 2015-05-05 MED ORDER — NALTREXONE HCL 50 MG PO TABS
50.0000 mg | ORAL_TABLET | Freq: Every day | ORAL | Status: DC
  Administered 2015-05-05 – 2015-05-07 (×3): 50 mg via ORAL
  Filled 2015-05-05 (×3): qty 1

## 2015-05-05 NOTE — Evaluation (Signed)
Physical Therapy Evaluation Patient Details Name: Wanda Prince MRN: [id number] DOB: 15-Feb-1956 Today's Date: 05/05/2015   History of Present Illness  Patient is a 60 y/o female with hx of COPD, ongoing tobacco use, hx of intracranial bleed s/p VP shunt placement, nonischemic cardiomyopathy presents with acute COPD exacerbation complicated by hypercarbic resp failure requiring intubation and possible ETOH withdrawal. CTA- no evidence of PE but with 15 mm spiculated mass RUL concerning for lung Ca  Clinical Impression  Patient presents with decreased strength, endurance, impaired respiratory status and impaired mobility impacting functional independence. Tolerated short distance ambulation however limited due to weakness, DOE and fatigue. Pt lives alone but has planned for 24/7 S at discharge from sisters and PCA. Will follow acutely to maximize independence and mobility prior to return home.    Follow Up Recommendations Home health PT;Supervision/Assistance - 24 hour    Equipment Recommendations  None recommended by PT    Recommendations for Other Services       Precautions / Restrictions Precautions Precautions: Fall Precaution Comments: tachycardic Restrictions Weight Bearing Restrictions: No      Mobility  Bed Mobility               General bed mobility comments: Sitting EOB upon PT arrival.   Transfers Overall transfer level: Needs assistance Equipment used: Rolling walker (2 wheeled) Transfers: Sit to/from Stand           General transfer comment: Min guard for safety and for stability.   Ambulation/Gait Ambulation/Gait assistance: Min assist Ambulation Distance (Feet): 25 Feet Assistive device: Rolling walker (2 wheeled) Gait Pattern/deviations: Step-through pattern;Decreased stride length;Narrow base of support;Trunk flexed   Gait velocity interpretation: Below normal speed for age/gender General Gait Details: Slow, guarded gait. Cues for upright posture  and pursed lip breathing. 3/4 DOE. Bil knee buckling with therapist lowering pt into chair. Sp02 remained >90% on RA. Dropped to 88% when returning to room.  Stairs            Wheelchair Mobility    Modified Rankin (Stroke Patients Only)       Balance Overall balance assessment: Needs assistance Sitting-balance support: Feet supported;Bilateral upper extremity supported Sitting balance-Leahy Scale: Fair Sitting balance - Comments: BUE support as position of comfort due to SOB.    Standing balance support: During functional activity Standing balance-Leahy Scale: Poor Standing balance comment: Reliant on UEs for support.                              Pertinent Vitals/Pain Pain Assessment: Faces Faces Pain Scale: Hurts even more Pain Location: back and LEs Pain Descriptors / Indicators: Aching;Sore Pain Intervention(s): Monitored during session;Repositioned;Patient requesting pain meds-RN notified    Home Living Family/patient expects to be discharged to:: Private residence Living Arrangements: Alone Available Help at Discharge: Family;Available PRN/intermittently Type of Home: Apartment Home Access: Stairs to enter Entrance Stairs-Rails: None Entrance Stairs-Number of Steps: 3 Home Layout: One level Home Equipment: Walker - 2 wheels;Cane - single point      Prior Function Level of Independence: Independent         Comments: She is arranging 24/7 S from 2 sisters and hiring PCA for night time. Does not drive. Sisters help with driving.     Hand Dominance        Extremity/Trunk Assessment   Upper Extremity Assessment: Defer to OT evaluation           Lower Extremity Assessment:  RLE deficits/detail;LLE deficits/detail RLE Deficits / Details: tingling in stocking glove distribution LLE Deficits / Details: tingling in stocking glove distribution     Communication   Communication: No difficulties  Cognition Arousal/Alertness:  Awake/alert Behavior During Therapy: Anxious Overall Cognitive Status: Within Functional Limits for tasks assessed                      General Comments General comments (skin integrity, edema, etc.): Pt tachycardic throughout session with HR in 120s.    Exercises        Assessment/Plan    PT Assessment Patient needs continued PT services  PT Diagnosis Difficulty walking;Generalized weakness;Acute pain   PT Problem List Decreased strength;Cardiopulmonary status limiting activity;Pain;Decreased activity tolerance;Decreased balance;Decreased mobility;Impaired sensation  PT Treatment Interventions Balance training;Gait training;Stair training;Functional mobility training;Therapeutic activities;Therapeutic exercise;Patient/family education   PT Goals (Current goals can be found in the Care Plan section) Acute Rehab PT Goals Patient Stated Goal: to go home PT Goal Formulation: With patient Time For Goal Achievement: 05/19/15 Potential to Achieve Goals: Good    Frequency Min 3X/week   Barriers to discharge Inaccessible home environment;Decreased caregiver support steps to enter home and lives alone    Co-evaluation               End of Session Equipment Utilized During Treatment: Gait belt Activity Tolerance: Other (comment) (anxiety and SOB.) Patient left: in chair;with call bell/phone within reach Nurse Communication: Mobility status         Time: 2694-8546 PT Time Calculation (min) (ACUTE ONLY): 17 min   Charges:   PT Evaluation $PT Eval Moderate Complexity: 1 Procedure     PT G Codes:        Joyann Spidle A Kavian Peters 05/05/2015, 2:47 PM  Wray Kearns, [place], DPT [phone]

## 2015-05-05 NOTE — Progress Notes (Signed)
Pt transferred from [facility], reported missing eye glass, called the nurse [facility]H and informed about this, will continue to monitor.

## 2015-05-05 NOTE — Progress Notes (Signed)
PT Cancellation Note  Patient Details Name: Wanda Prince MRN: [id number] DOB: [birth year]   Cancelled Treatment:    Reason Eval/Treat Not Completed: Patient at procedure or test/unavailablePt off floor. Will follow up next available time.   [name] 05/05/2015, 12:52 PM Wray Kearns, [place], DPT [phone]

## 2015-05-05 NOTE — Progress Notes (Signed)
Subjective:  Patient reports breathing much better this morning, and she did not need to use her BiPAP last night. She also reports feeling better after her enema and bowel movement. She continues to be motivated about quitting smoking and alcohol.   Objective: Vital signs in last 24 hours: Filed Vitals:   05/05/15 0400 05/05/15 0402 05/05/15 0800 05/05/15 0842  BP: 129/87  125/75   Pulse: 102  106   Temp: 98 F (36.7 C)  98.4 F (36.9 C)   TempSrc: Oral  Oral   Resp: 23  20   Height:      Weight: 123 lb 10.9 oz (56.1 kg)     SpO2: 97% 98% 98% 97%   Weight change:   Intake/Output Summary (Last 24 hours) at 05/05/15 1132 Last data filed at 05/05/15 0200  Gross per 24 hour  Intake   1130 ml  Output    350 ml  Net    780 ml   General: Lying in bed, NAD HEENT: Moist mucous membranes Cardiovascular: Slightly tachycardic but regular, no m/r/g Pulmonary: Clear to auscultation but Diminished air movement.  Abdominal: Soft NT/ND. Normal bowel sounds Extremities: No clubbing cyanosis or edema Skin: Warm and dry. Psychiatric: Normal behavior and affect  Lab Results: Basic Metabolic Panel:  Recent Labs Lab 05/03/15 0240 05/04/15 0231 05/05/15 0255  NA 141 141 141  K 4.6 4.3 4.5  CL 98* 98* 99*  CO2 36* 36* 34*  GLUCOSE 139* 128* 117*  BUN 13 22* 19  CREATININE <0.30* <0.30* 0.36*  CALCIUM 8.9 8.5* 8.8*  MG 2.0 2.0  --   PHOS 3.4 3.0  --    CBC:  Recent Labs Lab 05/04/15 0231 05/05/15 0255  WBC 15.7* 14.2*  HGB 13.0 13.5  HCT 40.4 42.3  MCV 96.4 97.2  PLT 122* 113*   CBG:  Recent Labs Lab 04/28/15 1254 04/28/15 1513 04/28/15 2022 05/05/15 0809  GLUCAP 147* 138* 91 115*    Studies/Results: Dg Abd 1 View  05/03/2015  CLINICAL DATA:  Lower abdominal pain. EXAM: ABDOMEN - 1 VIEW COMPARISON:  April 26, 2015. FINDINGS: The bowel gas pattern is normal. Ventriculoperitoneal shunt is seen with tip in pelvis. Phleboliths are noted in pelvis. IMPRESSION:  No evidence of bowel obstruction or ileus. Electronically Signed   By: Marijo Conception, M.D.   On: 05/03/2015 16:15   Medications: I have reviewed the patient's current medications. Scheduled Meds: . aspirin  81 mg Oral Daily  . B-complex with vitamin C  1 tablet Oral BID  . budesonide  0.25 mg Nebulization Q6H  . cholecalciferol  1,000 Units Oral Daily  . docusate sodium  200 mg Oral BID  . enoxaparin (LOVENOX) injection  40 mg Subcutaneous Q24H  . feeding supplement (ENSURE ENLIVE)  237 mL Oral TID BM  . gabapentin  300 mg Oral QHS  . ipratropium-albuterol  3 mL Nebulization Q4H  . levETIRAcetam  500 mg Oral BID  . nicotine  14 mg Transdermal Daily  . predniSONE  60 mg Oral BID WC   Continuous Infusions: . sodium chloride Stopped (05/04/15 1600)   PRN Meds:.acetaminophen **OR** acetaminophen, albuterol, cyclobenzaprine, LORazepam, ondansetron (ZOFRAN) IV Assessment/Plan:  COPD Exacerbation: Patient did not need BiPAP last night. She continues to be weaned off of oxygen, and is currently satting >95% on [facility]. If she continues to improve, she may not need oxygen at home.  - Budenoside nebulizer q6h - Duoneb q4h - Albuterol nebulazier q2h prn -  Day #1 Prednisone 60 mg BID , then decrease by 10 mg every 3 days. - Transfer to telemetry  Sinus Tachycardia: Could be multifactorial: anxiety, dyspnea, receiving both SABA and LABA. Has improved from 116 yesterday to 106 today with a downward trend. Arformoterol was discontinued on 1/5, which may have led to the downward trend. Another consideration is that she was off of pharmacologic DVT prophylaxis since 12/29 and was extubated on 12/30. She also had an episode of acute dyspnea on 1/2 associated with chest pain - invoking the possibility of an acute provoked pulmonary embolus.  - CT angiogram  History of Subarachnoid Hemorrhage: Headaches have improved. Neurologically in tact.  Seizure Disorder: No seizures this hospitalization. -  Keppra 500 mg BID  Tobacco Abuse: Motivated to quit.  - 14 mg patch - Provide gum on discharge.   Alcohol Withdrawal: Has been off CIWA protocol. She has been having cravings in the hospital and is interested in stopping alcohol use. - Start naltrexone 50 mg daily to assess for medication tolerance, if she tolerates, will continue on discharge - Continue Ativan prn anxiety - Continue thiamine and vitamins  DVT prophylaxis: Lovenox  Dispo: Disposition is deferred at this time, awaiting improvement of current medical problems.  Anticipated discharge in approximately 1-2 day(s).   The patient does have a current PCP Saralyn Pilar, MD) and does not need an [facility] hospital follow-up appointment after discharge.  The patient does not have transportation limitations that hinder transportation to clinic appointments.  .Services Needed at time of discharge: Y = Yes, Blank = No PT:   OT:   RN:   Equipment:   Other:     LOS: 12 days   Liberty Handy, MD 05/05/2015, 11:32 AM

## 2015-05-06 DIAGNOSIS — J9600 Acute respiratory failure, unspecified whether with hypoxia or hypercapnia: Secondary | ICD-10-CM | POA: Insufficient documentation

## 2015-05-06 DIAGNOSIS — J984 Other disorders of lung: Secondary | ICD-10-CM | POA: Insufficient documentation

## 2015-05-06 DIAGNOSIS — R160 Hepatomegaly, not elsewhere classified: Secondary | ICD-10-CM | POA: Insufficient documentation

## 2015-05-06 LAB — BASIC METABOLIC PANEL
Anion gap: 6 (ref 5–15)
BUN: 11 mg/dL (ref 6–20)
CO2: 36 mmol/L — ABNORMAL HIGH (ref 22–32)
Calcium: 8.7 mg/dL — ABNORMAL LOW (ref 8.9–10.3)
Chloride: 98 mmol/L — ABNORMAL LOW (ref 101–111)
Creatinine, Ser: 0.35 mg/dL — ABNORMAL LOW (ref 0.44–1.00)
GFR calc Af Amer: >60 mL/min (ref >60)
GFR calc non Af Amer: >60 mL/min (ref >60)
Glucose, Bld: 126 mg/dL — ABNORMAL HIGH (ref 65–99)
Potassium: 4.4 mmol/L (ref 3.5–5.1)
Sodium: 140 mmol/L (ref 135–145)

## 2015-05-06 LAB — GLUCOSE, CAPILLARY
Glucose-Capillary: 107 mg/dL — ABNORMAL HIGH (ref 65–99)
Glucose-Capillary: 148 mg/dL — ABNORMAL HIGH (ref 65–99)
Glucose-Capillary: 153 mg/dL — ABNORMAL HIGH (ref 65–99)

## 2015-05-06 LAB — CBC
HCT: 40.1 % (ref 36.0–46.0)
Hemoglobin: 13.2 g/dL (ref 12.0–15.0)
MCH: 31.7 pg (ref 26.0–34.0)
MCHC: 32.9 g/dL (ref 30.0–36.0)
MCV: 96.4 fL (ref 78.0–100.0)
Platelets: 128 10*3/uL — ABNORMAL LOW (ref 150–400)
RBC: 4.16 MIL/uL (ref 3.87–5.11)
RDW: 13.6 % (ref 11.5–15.5)
WBC: 15.8 10*3/uL — ABNORMAL HIGH (ref 4.0–10.5)

## 2015-05-06 MED ORDER — POLYETHYLENE GLYCOL 3350 17 G PO PACK: 17.0000 g | PACK | Freq: Every day | ORAL | Status: DC

## 2015-05-06 MED ORDER — MAGNESIUM HYDROXIDE 400 MG/5ML PO SUSP
5.0000 mL | Freq: Every day | ORAL | Status: DC
  Administered 2015-05-06 – 2015-05-07 (×2): 5 mL via ORAL
  Filled 2015-05-06 (×2): qty 30

## 2015-05-06 NOTE — Progress Notes (Signed)
Subjective:  Pt sitting up in bed eating breakfast. She reports her breathing is better and she would like to go home as she has not seen her cat since admission. She is normally fidgety and states she is at her baseline. She has arranged for her mother's former nurse aid to be with her at night and her sister Vickii Chafe will be with her during the day.   Objective: Vital signs in last 24 hours: Filed Vitals:   05/05/15 2103 05/06/15 0020 05/06/15 0448 05/06/15 0725  BP: 137/88 139/81 134/69   Pulse: 97 106 100   Temp: 97.4 F (36.3 C) 97.9 F (36.6 C) 97.5 F (36.4 C)   TempSrc: Oral Oral Oral   Resp: '20 20 20   '$ Height:      Weight:   114 lb 3.2 oz (51.8 kg)   SpO2: 97% 96% 96% 98%   Weight change: -7 lb 14.1 oz (-3.573 kg)  Intake/Output Summary (Last 24 hours) at 05/06/15 0943 Last data filed at 05/06/15 0919  Gross per 24 hour  Intake    800 ml  Output    800 ml  Net      0 ml   General: NAD, sitting up in bed HEENT: Moist mucous membranes Cardiovascular: Slightly tachycardic but regular, no m/r/g Pulmonary: Clear to auscultation, poor air movement, very faint wheezing Abdominal: Soft NT/ND. Normal bowel sounds Extremities: No clubbing cyanosis or edema Skin: Warm and dry. Psychiatric: Normal behavior and affect  Lab Results: Basic Metabolic Panel:  Recent Labs Lab 05/03/15 0240 05/04/15 0231 05/05/15 0255 05/06/15 0410  NA 141 141 141 140  K 4.6 4.3 4.5 4.4  CL 98* 98* 99* 98*  CO2 36* 36* 34* 36*  GLUCOSE 139* 128* 117* 126*  BUN 13 22* 19 11  CREATININE <0.30* <0.30* 0.36* 0.35*  CALCIUM 8.9 8.5* 8.8* 8.7*  MG 2.0 2.0  --   --   PHOS 3.4 3.0  --   --    CBC:  Recent Labs Lab 05/05/15 0255 05/06/15 0745  WBC 14.2* 15.8*  HGB 13.5 13.2  HCT 42.3 40.1  MCV 97.2 96.4  PLT 113* 128*   CBG:  Recent Labs Lab 05/05/15 0809 05/05/15 1643 05/05/15 2102 05/06/15 0614  GLUCAP 115* 122* 148* 107*    Studies/Results: Ct Angio Chest Pe W/cm &/or  Wo Cm  05/05/2015  CLINICAL DATA:  Short of breath 2 weeks.  Tobacco user. EXAM: CT ANGIOGRAPHY CHEST WITH CONTRAST TECHNIQUE: Multidetector CT imaging of the chest was performed using the standard protocol during bolus administration of intravenous contrast. Multiplanar CT image reconstructions and MIPs were obtained to evaluate the vascular anatomy. CONTRAST:  138m OMNIPAQUE IOHEXOL 350 MG/ML SOLN COMPARISON:  Chest x-ray 05/03/2015 FINDINGS: Negative for pulmonary embolism. Pulmonary arteries normal in caliber. Negative for aortic aneurysm or dissection. Mild atherosclerotic disease in the aortic arch. Heart size normal. Mild coronary calcification. No pericardial effusion. COPD with emphysema. Spiculated mass right upper lobe 15 mm in the posterior apex. Central gas density compatible with cavitation. This is concerning for carcinoma. Recommend PET scanning for further evaluation. Biopsy may also be necessary for diagnosis. 5 mm nodule superior segment left lower lobe. No other lung nodules identified. Minimal left effusion.  No right effusion. Negative for pneumonia. Negative for hilar or mediastinal adenopathy. Cirrhotic liver with ascites. Review of the MIP images confirms the above findings. IMPRESSION: Negative for pulmonary embolism 15 mm spiculated cavitary mass right upper lobe, highly concerning for carcinoma  of the lung. PET scanning recommended. Biopsy may be necessary for diagnosis. 5 mm nodule superior segment left lower lobe. This could represent a metastatic deposit or a benign nodule such as granuloma or scar. COPD with apical emphysema. Cirrhotic liver with ascites. Electronically Signed   By: Franchot Gallo M.D.   On: 05/05/2015 12:35   Medications: I have reviewed the patient's current medications. Scheduled Meds: . aspirin  81 mg Oral Daily  . B-complex with vitamin C  1 tablet Oral BID  . budesonide  0.25 mg Nebulization Q6H  . cholecalciferol  1,000 Units Oral Daily  . docusate  sodium  200 mg Oral BID  . enoxaparin (LOVENOX) injection  40 mg Subcutaneous Q24H  . feeding supplement (ENSURE ENLIVE)  237 mL Oral TID BM  . gabapentin  300 mg Oral QHS  . ipratropium-albuterol  3 mL Nebulization BID  . levETIRAcetam  500 mg Oral BID  . naltrexone  50 mg Oral Daily  . nicotine  14 mg Transdermal Daily  . polyethylene glycol  17 g Oral Daily  . predniSONE  60 mg Oral BID WC   Continuous Infusions: . sodium chloride Stopped (05/04/15 1600)   PRN Meds:.acetaminophen **OR** acetaminophen, albuterol, cyclobenzaprine, LORazepam, ondansetron (ZOFRAN) IV Assessment/Plan:  COPD Exacerbation: Pt is on room air and states her breathing is back to her baseline. Will have her ambulate and check pulse ox. If in >88% then likely can d/c home as she will have someone w/ her 24/7 - Budenoside nebulizer q6h - Duoneb q4h - Albuterol nebulazier q2h prn - Day #2 Prednisone 60 mg BID , then decrease by 10 mg every 3 days.  Sinus Tachycardia: stable, likely from COPD. CTA neg for PE.   Spiculated lung mass-- 35m spiculated mass in RUL noted on CTA. She is a current smoker x 30 years. Her brother died of lung cancer around 653y/o. Previous MRI of abd revealed 0.8cm liver mass. Discussed case w/ Dr. GNadara Eatonwho recommended getting an AFP lab and that he will follow up with her as outpatient on Monday.    Seizure Disorder: No seizures this hospitalization. - Keppra 500 mg BID  Tobacco Abuse: Motivated to quit.  - 14 mg patch - Provide gum on discharge.   Alcohol Withdrawal: Has been off CIWA protocol. She has been having cravings in the hospital and is interested in stopping alcohol use. - Start naltrexone 50 mg daily to assess for medication tolerance, if she tolerates, will continue on discharge - Continue Ativan prn anxiety - Continue thiamine and vitamins  DVT prophylaxis: Lovenox  Dispo: Disposition is deferred at this time, awaiting improvement of current medical problems.     The patient does have a current PCP (Saralyn Pilar MD) and [facility]  The patient does not have transportation limitations that hinder transportation to clinic appointments.  .Services Needed at time of discharge: Y = Yes, Blank = No PT:   OT:   RN:   Equipment:   Other:     LOS: 13 days   DNorman Herrlich MD 05/06/2015, 9:43 AM

## 2015-05-07 DIAGNOSIS — Z9689 Presence of other specified functional implants: Secondary | ICD-10-CM

## 2015-05-07 LAB — GLUCOSE, CAPILLARY
Glucose-Capillary: 193 mg/dL — ABNORMAL HIGH (ref 65–99)
Glucose-Capillary: 144 mg/dL — ABNORMAL HIGH (ref 65–99)

## 2015-05-07 LAB — AFP TUMOR MARKER: AFP-Tumor Marker: 9.4 ng/mL — ABNORMAL HIGH (ref 0.0–8.3)

## 2015-05-07 MED ORDER — ALBUTEROL SULFATE (2.5 MG/3ML) 0.083% IN NEBU: 2.5000 mg | INHALATION_SOLUTION | RESPIRATORY_TRACT | Status: DC | PRN

## 2015-05-07 MED ORDER — NICOTINE POLACRILEX 4 MG MT GUM: 4.0000 mg | CHEWING_GUM | OROMUCOSAL | Status: DC | PRN

## 2015-05-07 MED ORDER — POLYETHYLENE GLYCOL 3350 17 G PO PACK: 17.0000 g | PACK | Freq: Every day | ORAL | Status: DC

## 2015-05-07 MED ORDER — NALTREXONE HCL 50 MG PO TABS: 50.0000 mg | ORAL_TABLET | Freq: Every day | ORAL | Status: DC

## 2015-05-07 MED ORDER — PREDNISONE 10 MG PO TABS: ORAL_TABLET | ORAL | Status: DC

## 2015-05-07 MED ORDER — NICOTINE 14 MG/24HR TD PT24: 14.0000 mg | MEDICATED_PATCH | Freq: Every day | TRANSDERMAL | Status: DC

## 2015-05-07 MED ORDER — ALBUTEROL SULFATE (2.5 MG/3ML) 0.083% IN NEBU: 2.5000 mg | INHALATION_SOLUTION | Freq: Four times a day (QID) | RESPIRATORY_TRACT | Status: DC | PRN

## 2015-05-07 NOTE — Progress Notes (Signed)
SATURATION QUALIFICATIONS: (This note is used to comply with regulatory documentation for home oxygen)  Patient Saturations on Room Air at Rest = 92%  Patient Saturations on Room Air while Ambulating = 90%  Patient Saturations on 2 Liters of oxygen while Ambulating = 98%  Please briefly explain why patient needs home oxygen:

## 2015-05-07 NOTE — Discharge Summary (Signed)
Name: Wanda Prince 60 y.o. PCP: Saralyn Pilar, MD  Date of Admission: 04/22/2015 10:37 AM Date of Discharge: 05/07/2015 Attending Physician: Aldine Contes, MD  Discharge Diagnosis: Active Problems:   COPD with acute exacerbation ([place])   COPD exacerbation ([place])   Acute respiratory failure with hypoxemia ([place])   Encounter for imaging study to confirm orogastric (OG) tube placement   Encounter for nasogastric (NG) tube placement   Acute respiratory failure ([place])   Lung mass   Liver mass  Discharge Medications:   Medication List    TAKE these medications        acetaminophen 500 MG tablet  Commonly known as:  TYLENOL  Take 1,000 mg by mouth every 6 (six) hours as needed for pain.     albuterol (2.5 MG/3ML) 0.083% nebulizer solution  Commonly known as:  PROVENTIL  Take 3 mLs (2.5 mg total) by nebulization every 6 (six) hours as needed for wheezing or shortness of breath.     albuterol (2.5 MG/3ML) 0.083% nebulizer solution  Commonly known as:  PROVENTIL  Take 3 mLs (2.5 mg total) by nebulization every 2 (two) hours as needed for wheezing or shortness of breath.     albuterol 108 (90 Base) MCG/ACT inhaler  Commonly known as:  PROVENTIL HFA;VENTOLIN HFA  Inhale 2 puffs into the lungs every 4 (four) hours as needed for wheezing or shortness of breath.     ALDACTONE 25 MG tablet  Generic drug:  spironolactone  Take 25 mg by mouth daily.     aspirin 81 MG chewable tablet  Chew 81 mg by mouth daily.     b complex vitamins tablet  Take 1 tablet by mouth 2 (two) times daily.     cholecalciferol 1000 units tablet  Commonly known as:  VITAMIN D  Take 1,000 Units by mouth daily.     cyclobenzaprine 5 MG tablet  Commonly known as:  FLEXERIL  Take 5 mg by mouth every 8 (eight) hours as needed for muscle spasms.     Fluticasone-Salmeterol 250-50 MCG/DOSE Aepb  Commonly known as:  ADVAIR  Inhale 1 puff into the lungs every 12 (twelve)  hours.     folic acid 1 MG tablet  Commonly known as:  FOLVITE  Take 1 mg by mouth daily.     gabapentin 300 MG capsule  Commonly known as:  NEURONTIN  Take 300 mg by mouth at bedtime.     lactulose 10 GM/15ML solution  Commonly known as:  CHRONULAC  Take 15 mLs (10 g total) by mouth 2 (two) times daily.     levETIRAcetam 500 MG tablet  Commonly known as:  KEPPRA  Take 1 tablet (500 mg total) by mouth 2 (two) times daily.     lisinopril 10 MG tablet  Commonly known as:  PRINIVIL,ZESTRIL  Take 10 mg by mouth daily.     multivitamin with minerals Tabs tablet  Take 1 tablet by mouth daily.     naltrexone 50 MG tablet  Commonly known as:  DEPADE  Take 1 tablet (50 mg total) by mouth daily.     nicotine 14 mg/24hr patch  Commonly known as:  NICODERM CQ - dosed in mg/24 hours  Place 1 patch (14 mg total) onto the skin daily.     nicotine polacrilex 4 MG gum  Commonly known as:  NICORETTE  Take 1 each (4 mg total) by mouth as needed for smoking cessation.  polyethylene glycol packet  Commonly known as:  MIRALAX / GLYCOLAX  Take 17 g by mouth daily.     predniSONE 10 MG tablet  Commonly known as:  DELTASONE  Take '50mg'$  BID for 3 days, '40mg'$  BID for 3 days, '30mg'$  BID x 3 days, '20mg'$  BID x 3 days, '10mg'$  BID x 3 days, '10mg'$  QD x 3 days and then stop.     thiamine 100 MG tablet  Take 1 tablet (100 mg total) by mouth daily.     tiotropium 18 MCG inhalation capsule  Commonly known as:  SPIRIVA  Place 1 capsule (18 mcg total) into inhaler and inhale daily.     TOPROL XL 25 MG 24 hr tablet  Generic drug:  metoprolol succinate  Take 25 mg by mouth daily.     Vitamin D (Ergocalciferol) 50000 units Caps capsule  Commonly known as:  DRISDOL  Take 50,000 Units by mouth every 7 (seven) days. On Mondays        Disposition and follow-up:   Wanda Prince was discharged from [facility] in Good condition.  At the hospital follow up visit please address:  1.   Please ensure patient has follow up with Dr. Lindi Adie for the 31m spiculated lung mass noted on CTA (1/6) and elevated AFP of 9.4  2.  Labs / imaging needed at time of follow-up: MRI of abd/pelvis for 0.8cm liver mass noted in June 2016 if not already ordered by Dr. GLindi Adie   3.  Pending labs/ test needing follow-up: none  Follow-up Appointments:   Discharge Instructions:     Discharge Instructions    Call MD for:  difficulty breathing, headache or visual disturbances    Complete by:  As directed      Increase activity slowly    Complete by:  As directed            Consultations:    Procedures Performed:  Dg Chest 2 View  04/22/2015  CLINICAL DATA:  Nonproductive cough.  Labored breathing. EXAM: CHEST  2 VIEW COMPARISON:  01/10/2014 FINDINGS: The heart size and mediastinal contours are within normal limits. Lungs are hyperinflated and there are coarsened interstitial markings noted bilaterally. No focal airspace opacity. The visualized skeletal structures are unremarkable. IMPRESSION: 1. No acute findings. 2. Hyperinflation and coarsened interstitial markings of emphysema. Electronically Signed   By: TKerby MoorsM.D.   On: 04/22/2015 11:52   Dg Abd 1 View  05/03/2015  CLINICAL DATA:  Lower abdominal pain. EXAM: ABDOMEN - 1 VIEW COMPARISON:  April 26, 2015. FINDINGS: The bowel gas pattern is normal. Ventriculoperitoneal shunt is seen with tip in pelvis. Phleboliths are noted in pelvis. IMPRESSION: No evidence of bowel obstruction or ileus. Electronically Signed   By: JMarijo Conception M.D.   On: 05/03/2015 16:15   Dg Abd 1 View  04/25/2015  CLINICAL DATA:  Orogastric tube placement. EXAM: ABDOMEN - 1 VIEW COMPARISON:  February 28, 2012. FINDINGS: Distal tip of feeding tube is seen in expected position of distal stomach. Stool is noted in the right colon. Right-sided ventriculoperitoneal shunt is again noted. No abnormal bowel gas pattern is noted. IMPRESSION: Distal tip of feeding  tube seen in expected position of distal stomach. Electronically Signed   By: JMarijo Conception M.D.   On: 04/25/2015 13:48   Ct Angio Chest Pe W/cm &/or Wo Cm  05/05/2015  CLINICAL DATA:  Short of breath 2 weeks.  Tobacco user. EXAM: CT ANGIOGRAPHY CHEST  WITH CONTRAST TECHNIQUE: Multidetector CT imaging of the chest was performed using the standard protocol during bolus administration of intravenous contrast. Multiplanar CT image reconstructions and MIPs were obtained to evaluate the vascular anatomy. CONTRAST:  185m OMNIPAQUE IOHEXOL 350 MG/ML SOLN COMPARISON:  Chest x-ray 05/03/2015 FINDINGS: Negative for pulmonary embolism. Pulmonary arteries normal in caliber. Negative for aortic aneurysm or dissection. Mild atherosclerotic disease in the aortic arch. Heart size normal. Mild coronary calcification. No pericardial effusion. COPD with emphysema. Spiculated mass right upper lobe 15 mm in the posterior apex. Central gas density compatible with cavitation. This is concerning for carcinoma. Recommend PET scanning for further evaluation. Biopsy may also be necessary for diagnosis. 5 mm nodule superior segment left lower lobe. No other lung nodules identified. Minimal left effusion.  No right effusion. Negative for pneumonia. Negative for hilar or mediastinal adenopathy. Cirrhotic liver with ascites. Review of the MIP images confirms the above findings. IMPRESSION: Negative for pulmonary embolism 15 mm spiculated cavitary mass right upper lobe, highly concerning for carcinoma of the lung. PET scanning recommended. Biopsy may be necessary for diagnosis. 5 mm nodule superior segment left lower lobe. This could represent a metastatic deposit or a benign nodule such as granuloma or scar. COPD with apical emphysema. Cirrhotic liver with ascites. Electronically Signed   By: CFranchot GalloM.D.   On: 05/05/2015 12:35   Dg Chest Port 1 View  05/03/2015  CLINICAL DATA:  Acute respiratory failure, history of [place] OPD,  intracranial hemorrhage, previous MI, current smoker. EXAM: PORTABLE CHEST 1 VIEW COMPARISON:  Portable chest x-ray of April 30, 2015 FINDINGS: The lungs are mildly hyperinflated and clear. The minimal increased density in the left lower hemi thorax has cleared. The heart and pulmonary vascularity are normal. The mediastinum is normal in width. The thoracic portion of the VP shunt tube is stable. The right subclavian venous catheter has been removed. IMPRESSION: COPD.  There is no acute cardiopulmonary abnormality. Electronically Signed   By: David  JMartiniqueM.D.   On: 05/03/2015 07:26   Dg Chest Port 1 View  04/30/2015  CLINICAL DATA:  Respiratory failure. EXAM: PORTABLE CHEST 1 VIEW COMPARISON:  April 28, 2015. FINDINGS: The heart size and mediastinal contours are within normal limits. No pneumothorax is noted. Stable right subclavian catheter line with distal tip in expected position of the SVC. Stable position of right ventriculoperitoneal shunt. Mild left basilar opacity is noted concerning for atelectasis with minimal associated pleural effusion. Right lung is clear. The visualized skeletal structures are unremarkable. IMPRESSION: Mild left basilar opacity concerning for subsegmental atelectasis with minimal associated pleural effusion. Electronically Signed   By: JMarijo Conception M.D.   On: 04/30/2015 09:18   Dg Chest Port 1 View  04/28/2015  CLINICAL DATA:  Check endotracheal tube placement EXAM: PORTABLE CHEST - 1 VIEW COMPARISON:  04/27/2015 FINDINGS: Cardiac shadow is stable. Endotracheal tube is again noted 2.7 cm above the carina. A nasogastric catheter courses into the stomach. A right subclavian catheter is noted in place. Shunt catheter is noted over the right chest. The lungs are well aerated bilaterally. Minimal atelectatic changes are again seen. No new focal abnormality is noted. IMPRESSION: No significant change from the prior exam. Electronically Signed   By: MInez CatalinaM.D.   On:  04/28/2015 07:34   Dg Chest Port 1 View  04/27/2015  CLINICAL DATA:  ET tube EXAM: PORTABLE CHEST 1 VIEW COMPARISON:  04/26/2015 FINDINGS: Endotracheal tube is 2.5 cm above the carina. Right central  line and NG tube are unchanged. Heart is normal size. Minimal bibasilar atelectasis. Mild hyperinflation of the lungs. No effusions or acute bony abnormality. IMPRESSION: Support devices are stable. Minimal bibasilar atelectasis. Electronically Signed   By: Rolm Baptise M.D.   On: 04/27/2015 08:08   Dg Chest Port 1 View  04/26/2015  CLINICAL DATA:  Hypoxia EXAM: PORTABLE CHEST 1 VIEW COMPARISON:  Study obtained earlier in the day FINDINGS: Endotracheal tube tip is 2.3 cm above the carina. Nasogastric tube tip and side port are below the diaphragm. Central catheter tip is in the superior vena cava. No pneumothorax. Lungs are clear. Heart size and pulmonary vascularity are normal. No adenopathy. IMPRESSION: Tube and catheter positions as described without pneumothorax. No edema or consolidation. Electronically Signed   By: Lowella Grip III M.D.   On: 04/26/2015 11:36   Dg Chest Port 1 View  04/26/2015  CLINICAL DATA:  Hypoxia EXAM: PORTABLE CHEST 1 VIEW COMPARISON:  April 25, 2015 FINDINGS: Endotracheal tube tip is 6.1 cm above the carina. Nasogastric tube tip and side port are below the diaphragm. Central catheter tip is in the superior vena cava. There is no demonstrable pneumothorax. There is no appreciable edema or consolidation. The heart size and pulmonary vascularity are normal. No adenopathy. IMPRESSION: Tube and catheter positions as described without pneumothorax. No edema or consolidation. Electronically Signed   By: Lowella Grip III M.D.   On: 04/26/2015 07:19   Dg Chest Port 1 View  04/25/2015  CLINICAL DATA:  Central line placement, meningitis, ventricular shunt, seizure disorder, COPD EXAM: PORTABLE CHEST 1 VIEW COMPARISON:  Portable exam 1222 hours compared to 04/23/2015  FINDINGS: Tip of endotracheal tube projects 3.8 cm above carina. RIGHT subclavian central venous catheter tip projects over SVC. Shunt tubing traverses RIGHT hemi thorax. Normal heart size, mediastinal contours, and pulmonary vascularity. Atherosclerotic calcification aorta. Lungs emphysematous with minimal atelectasis at RIGHT base. No additional infiltrate, pleural effusion or pneumothorax. Osseous structures stable. IMPRESSION: No pneumothorax following RIGHT subclavian line placement. Satisfactory endotracheal tube position. COPD changes with minimal RIGHT basilar atelectasis. Electronically Signed   By: Lavonia Dana M.D.   On: 04/25/2015 12:31   Dg Chest Port 1 View  04/23/2015  CLINICAL DATA:  Acute onset of shortness of breath. Initial encounter. EXAM: PORTABLE CHEST 1 VIEW COMPARISON:  Chest radiograph performed 04/22/2015 FINDINGS: The lungs are hyperexpanded, with flattening of the hemidiaphragms, compatible with COPD. No pleural effusion or pneumothorax is seen. The cardiomediastinal silhouette remains normal in size. No acute osseous abnormalities are identified. Mild chronic left-sided rib deformities are noted. A right-sided ventriculoperitoneal shunt is partially imaged. IMPRESSION: Findings of COPD.  No acute cardiopulmonary process seen. Electronically Signed   By: Garald Balding M.D.   On: 04/23/2015 00:38   Dg Abd Portable 1v  04/26/2015  CLINICAL DATA:  NG tube placement EXAM: PORTABLE ABDOMEN - 1 VIEW COMPARISON:  04/25/2015 FINDINGS: NG tube is in place with the tip in the mid to distal stomach, in stable position since prior study. Nonobstructive bowel gas pattern. No free air organomegaly. IMPRESSION: NG tube tip in the mid to distal stomach in stable position. No obstruction. Electronically Signed   By: Rolm Baptise M.D.   On: 04/26/2015 11:47     Admission HPI: Ms. Pennywell is a 60 year old man with a past medical history of COPD, active tobacco abuse, history of ETOH and cocaine  abuse presents with worsening shortness of breath. She reports that over the past day  she has had worsening shortness of breath with a non-productive cough that has not responded to her home albuterol. She reports she uses Spiriva and Advair at home. Reports normally she uses her albuterol inhaler 2-3 times a day but over the past week she has been requiring it 4-5 times a day. She had some relief with the albuterol until yesterday evening when her shortness of breath continued to get worse and finally came to the ED this morning. She received albuterol and solumedrol in the ED with improvement in her symptoms but acutely worsened with O2 desaturation. She denies any fevers, chills, chest pain, nausea, vomiting or diarrhea. She is not on any home O2. Current everyday smoker, 1 PPD.   Hospital Course by problem list: Active Problems:   COPD with acute exacerbation ([place])   COPD exacerbation ([place])   Acute respiratory failure with hypoxemia ([place])   Encounter for imaging study to confirm orogastric (OG) tube placement   Encounter for nasogastric (NG) tube placement   Acute respiratory failure ([place])   Lung mass   Liver mass   1. COPD exacerbation-- admitted for COPD exacerbation, given solumedrol '125mg'$  once in the ED and then another '50mg'$  dose by the night team for respiratory distress. She also had some agitation overnight requiring ativan and restraints. At that time family members including her HCPOA Vickii Chafe stated that pt no longer drank alcohol on a regular basis and also cut back on drinking. Thus at that point steroid induced psychosis was a consideration for her agitation and steroids were held the following day. Pt was still agitated the next day with worsening shortness of breath w/poor air movement and she was restarted on steroids. She later was seen tripoding and required BIPAP but became lethargic. PCCM was consulted and she was transferred to the ICU and ultimately intubated. She self-extubated  on 12/30 and had a slow recovery requiring BiPAP on and off, and she was transferred back to stepdown for increased work of breathing. Of note, her pharmacologic DVT prophylaxis (heparin) was discontinued on 12/29 due to thrombocytopenia, down to 95 from her baseline of 110-140, using SCDs only. Due to difficulty w/ weaning O2 and tachycardia along w/ being off DVT ppx a CTA was obtained which revealed a 20m spiculated mass in the RUL and was neg for PE. Her IV solumedrol was weaned to 60 mg IV BID and she was continued on budenoside and arformoterol nebulizer. She slowly improved and was transferred back to the Internal teaching service with recommendations of a slow taper of steroid. On discharge she was sent home with prednisone '50mg'$  BID w/ instructions to decreased prednisone by '10mg'$  q3 days.   2. Alcohol abuse: Pt initially not forth coming w/ alcohol use but placed on CIWA protocol on admission due to hx of alcohol abuse in her chart. On second night of admission she had agitation and received ativan and subsequently transferred to the ICU as above and placed on precedex gtt. Started on naltrexone '50mg'$  during this admission which was continued on discharge.   3. Lung mass: CT angiogram of lungs on 1/6 revealed a 168mspiculated cavitary mass in the right upper lobe concerning for lung caner. She also had a 86m28module in the superior segment of the left lower lobe which could represent a metastatic deposit or a benign nodule. Her case was discussed w/Dr. GudLindi Adieom oncology who will arrange for outpatient follow up. An AFP level was drawn to follow up on a 0.8cm liver lesion  noted on MRI abd/pelvis which came back elevated at 9.4. Will have further work up with outpatient oncology.    Discharge Vitals:   BP 138/87 mmHg  Pulse 113  Temp(Src) 97.6 F (36.4 C) (Oral)  Resp 20  Ht '5\' 8"'$  (1.727 m)  Wt 105 lb 4.8 oz (47.764 kg)  BMI 16.01 kg/m2  SpO2 96%  Discharge Labs:  Results for orders  placed or performed during the hospital encounter of 04/22/15 (from the past 24 hour(s))  Glucose, capillary     Status: Abnormal   Collection Time: 05/06/15  9:27 PM  Result Value Ref Range   Glucose-Capillary 153 (H) 65 - 99 mg/dL  Glucose, capillary     Status: Abnormal   Collection Time: 05/07/15  6:09 AM  Result Value Ref Range   Glucose-Capillary 193 (H) 65 - 99 mg/dL  Glucose, capillary     Status: Abnormal   Collection Time: 05/07/15 11:29 AM  Result Value Ref Range   Glucose-Capillary 144 (H) 65 - 99 mg/dL   Comment 1 Notify RN     Signed: Norman Herrlich, MD 05/07/2015, 1:33 PM    Services Ordered on Discharge: none Equipment Ordered on Discharge: none

## 2015-05-07 NOTE — Discharge Planning (Signed)
Patient discharged home in stable condition. Verbalizes understanding of all discharge instructions, including home medications and follow up appointments. 

## 2015-05-07 NOTE — Discharge Instructions (Signed)
Start taking prednisone '50mg'$  twice a day for 3 days (until 1/11), then '40mg'$  twice a day for 3 days ( until 1/14), then '30mg'$  twice a day for 3 days, then '20mg'$  twice a day for 3 days, then '10mg'$  twice a day for 3 days, then '10mg'$  once a day for 3 days and then stop.   Start taking naltrexone '50mg'$  daily. This was started during this admission and more information on it is attached to this discharge summary.   Nicotine patches, nicotine gum, and albuterol nebulizer refills were also sent to your pharmacy.   Dr. Lindi Adie with oncology will contact you for an appointment to further evaluate the lung nodule found this admission.   Please make sure to call your PCP tomorrow and schedule a hospital follow up appointment, it would be best to see her within the next week.   Naltrexone tablets What is this medicine? NALTREXONE (nal TREX one) helps you to remain free of your dependence on opiate drugs or alcohol. It blocks the 'high' that these substances can give you. This medicine is combined with counseling and support groups. This medicine may be used for other purposes; ask your health care provider or pharmacist if you have questions. What should I tell my health care provider before I take this medicine? They need to know if you have any of these conditions: -if you have used drugs or alcohol within 7 to 10 days -kidney disease -liver disease, including hepatitis -an unusual or allergic reaction to naltrexone, other medicines, foods, dyes, or preservatives -pregnant or trying to get pregnant -breast-feeding How should I use this medicine? Take this medicine by mouth with a full glass of water. Follow the directions on the prescription label. Do not take this medicine within 7 to 10 days of taking any opioid drugs. Take your medicine at regular intervals. Do not take your medicine more often than directed. Do not stop taking except on your doctor's advice. Talk to your pediatrician regarding the use of  this medicine in children. Special care may be needed. Overdosage: If you think you have taken too much of this medicine contact a poison control center or emergency room at once. NOTE: This medicine is only for you. Do not share this medicine with others. What if I miss a dose? If you miss a dose and remember on the same day, take the missed dose. If you do not remember until the next day, ask your doctor or health care professional about rescheduling your doses. Do not take double or extra doses. What may interact with this medicine? Do not take this medicine with any of the following medications: -any prescription or street opioid drug like codiene, heroin, methadone This medicine may also interact with the following medications: -disulfiram -thioridazine This list may not describe all possible interactions. Give your health care provider a list of all the medicines, herbs, non-prescription drugs, or dietary supplements you use. Also tell them if you smoke, drink alcohol, or use illegal drugs. Some items may interact with your medicine. What should I watch for while using this medicine? Your condition will be monitored carefully while you are receiving this medicine. Visit your doctor or health care professional regularly. For this medicine to be most effective you should attend any counseling or support groups that your doctor or health care professional recommends. Do not try to overcome the effects of the medicine by taking large amounts of narcotics or by drinking large amounts of alcohol. This can cause severe  problems including death. Also, you may be more sensitive to lower doses of narcotics after you stop taking this medicine. If you are going to have surgery, tell your doctor or health care professional that you are taking this medicine. Do not treat yourself for coughs, colds, pain, or diarrhea. Ask your doctor or health care professional for advice. Some of the ingredients may interact  with this medicine and cause side effects. Wear a medical ID bracelet or chain, and carry a card that describes your disease and details of your medicine and dosage times. You may get drowsy or dizzy. Do not drive, use machinery, or do anything that needs mental alertness until you know how this medicine affects you. Do not stand or sit up quickly, especially if you are an older patient. This reduces the risk of dizzy or fainting spells. Alcohol may interfere with the effect of this medicine. Avoid alcoholic drinks. What side effects may I notice from receiving this medicine? Side effects that you should report to your doctor or health care professional as soon as possible: -allergic reactions like skin rash, itching or hives, swelling of the face, lips, or tongue -breathing problems -changes in vision, hearing -confusion -dark urine -depressed mood -diarrhea -fast or irregular heart beat -hallucination, loss of contact with reality -light-colored stools -right upper belly pain -suicidal thoughts or other mood changes -unusually weak or tired -vomiting -yellowing of the eyes or skin Side effects that usually do not require medical attention (report to your doctor or health care professional if they continue or are bothersome): -aches, pains -change in sex drive or performance -feeling anxious -headache -loss of appetite, nausea -runny nose, sinus problems, sneezing -stomach pain -trouble sleeping This list may not describe all possible side effects. Call your doctor for medical advice about side effects. You may report side effects to FDA at 1-800-FDA-1088. Where should I keep my medicine? Keep out of the reach of children. Store at room temperature between 20 and 25 degrees C (68 and 77 degrees F). Throw away any unused medicine after the expiration date. NOTE: This sheet is a summary. It may not cover all possible information. If you have questions about this medicine, talk to your  doctor, pharmacist, or health care provider.    2016, [employer]. (2012-02-06 10:33:18)

## 2015-05-07 NOTE — Progress Notes (Signed)
Subjective: Pt on 2L O2 [place] resting in bed. Denies any complaints, had one small BM yesterday.   Objective: Vital signs in last 24 hours: Filed Vitals:   05/06/15 2127 05/07/15 0302 05/07/15 0416 05/07/15 0727  BP: 141/67  102/83   Pulse: 97  107   Temp: 97.8 F (36.6 C)  98.5 F (36.9 C)   TempSrc: Oral  Oral   Resp: 20  20   Height:      Weight:   105 lb 4.8 oz (47.764 kg)   SpO2: 96% 96% 97% 98%   Weight change: -10 lb 8 oz (-4.763 kg)  Intake/Output Summary (Last 24 hours) at 05/07/15 0954 Last data filed at 05/07/15 0650  Gross per 24 hour  Intake    240 ml  Output   1725 ml  Net  -1485 ml   General: NAD, laying in bed HEENT: Moist mucous membranes Cardiovascular: Slightly tachycardic but regular, no m/r/g Pulmonary: Clear to auscultation, improved air movement, no wheezes Abdominal: Soft NT/ND. Normal bowel sounds Extremities: No clubbing cyanosis or edema Skin: Warm and dry. Psychiatric: Normal behavior and affect  Lab Results: Basic Metabolic Panel:  Recent Labs Lab 05/03/15 0240 05/04/15 0231 05/05/15 0255 05/06/15 0410  NA 141 141 141 140  K 4.6 4.3 4.5 4.4  CL 98* 98* 99* 98*  CO2 36* 36* 34* 36*  GLUCOSE 139* 128* 117* 126*  BUN 13 22* 19 11  CREATININE <0.30* <0.30* 0.36* 0.35*  CALCIUM 8.9 8.5* 8.8* 8.7*  MG 2.0 2.0  --   --   PHOS 3.4 3.0  --   --    CBC:  Recent Labs Lab 05/05/15 0255 05/06/15 0745  WBC 14.2* 15.8*  HGB 13.5 13.2  HCT 42.3 40.1  MCV 97.2 96.4  PLT 113* 128*   CBG:  Recent Labs Lab 05/05/15 1643 05/05/15 2102 05/06/15 0614 05/06/15 1125 05/06/15 2127 05/07/15 0609  GLUCAP 122* 148* 107* 148* 153* 193*    Studies/Results: Ct Angio Chest Pe W/cm &/or Wo Cm  05/05/2015  CLINICAL DATA:  Short of breath 2 weeks.  Tobacco user. EXAM: CT ANGIOGRAPHY CHEST WITH CONTRAST TECHNIQUE: Multidetector CT imaging of the chest was performed using the standard protocol during bolus administration of intravenous  contrast. Multiplanar CT image reconstructions and MIPs were obtained to evaluate the vascular anatomy. CONTRAST:  193m OMNIPAQUE IOHEXOL 350 MG/ML SOLN COMPARISON:  Chest x-ray 05/03/2015 FINDINGS: Negative for pulmonary embolism. Pulmonary arteries normal in caliber. Negative for aortic aneurysm or dissection. Mild atherosclerotic disease in the aortic arch. Heart size normal. Mild coronary calcification. No pericardial effusion. COPD with emphysema. Spiculated mass right upper lobe 15 mm in the posterior apex. Central gas density compatible with cavitation. This is concerning for carcinoma. Recommend PET scanning for further evaluation. Biopsy may also be necessary for diagnosis. 5 mm nodule superior segment left lower lobe. No other lung nodules identified. Minimal left effusion.  No right effusion. Negative for pneumonia. Negative for hilar or mediastinal adenopathy. Cirrhotic liver with ascites. Review of the MIP images confirms the above findings. IMPRESSION: Negative for pulmonary embolism 15 mm spiculated cavitary mass right upper lobe, highly concerning for carcinoma of the lung. PET scanning recommended. Biopsy may be necessary for diagnosis. 5 mm nodule superior segment left lower lobe. This could represent a metastatic deposit or a benign nodule such as granuloma or scar. COPD with apical emphysema. Cirrhotic liver with ascites. Electronically Signed   By: CFranchot GalloM.D.   On:  05/05/2015 12:35   Medications: I have reviewed the patient's current medications. Scheduled Meds: . aspirin  81 mg Oral Daily  . B-complex with vitamin C  1 tablet Oral BID  . budesonide  0.25 mg Nebulization Q6H  . cholecalciferol  1,000 Units Oral Daily  . docusate sodium  200 mg Oral BID  . enoxaparin (LOVENOX) injection  40 mg Subcutaneous Q24H  . feeding supplement (ENSURE ENLIVE)  237 mL Oral TID BM  . gabapentin  300 mg Oral QHS  . ipratropium-albuterol  3 mL Nebulization BID  . levETIRAcetam  500 mg  Oral BID  . magnesium hydroxide  5 mL Oral Daily  . naltrexone  50 mg Oral Daily  . nicotine  14 mg Transdermal Daily  . polyethylene glycol  17 g Oral Daily  . predniSONE  60 mg Oral BID WC   Continuous Infusions: . sodium chloride Stopped (05/04/15 1600)   PRN Meds:.acetaminophen **OR** acetaminophen, albuterol, cyclobenzaprine, LORazepam, ondansetron (ZOFRAN) IV Assessment/Plan:  COPD Exacerbation: Pt on 2L [place] breathing comfortably. No wheezing on exam today with improved air flow on auscultation. Will have RN ambulate pt and possibly discharge pt home with oxygen if needed.  - Budenoside nebulizer q6h - Duoneb q4h - Albuterol nebulazier q2h prn - Day #3 Prednisone 60 mg BID, will start prednisone '50mg'$  tomorrow, then decrease by 10 mg every 3 days.  Sinus Tachycardia: stable, likely from COPD. CTA neg for PE.  Spiculated lung mass-- 73m spiculated mass in RUL noted on CTA. She is a current smoker x 30 years. Her brother died of lung cancer around 619y/o. Previous MRI of abd revealed 0.8cm liver mass. Discussed case w/ Dr. GNadara Eatonwho recommended getting an AFP lab and that he will follow up with her as outpatient on Monday.  - AFP elevated at 9.4, Dr. GLindi Adiewill call pt Monday for an appointment.  Seizure Disorder: No seizures this hospitalization. - Keppra 500 mg BID  Tobacco Abuse: Motivated to quit.  - 14 mg patch - Provide gum on discharge.   Alcohol Withdrawal: Has been off CIWA protocol. She has been having cravings in the hospital and is interested in stopping alcohol use. - cont naltrexone 50 mg daily, will prescribe on discharge.  - Continue Ativan prn anxiety - Continue thiamine and vitamins  DVT prophylaxis: Lovenox  Dispo: Disposition is deferred at this time, awaiting improvement of current medical problems.    The patient does have a current PCP (Saralyn Pilar MD) and [facility].  The patient  does not have transportation limitations that hinder transportation to clinic appointments.  .Services Needed at time of discharge: Y = Yes, Blank = No PT:   OT:   RN:   Equipment:   Other:     LOS: 14 days   DNorman Herrlich MD 05/07/2015, 9:54 AM

## 2015-05-17 ENCOUNTER — Other Ambulatory Visit: Payer: Self-pay | Admitting: *Deleted

## 2015-05-18 ENCOUNTER — Encounter: Payer: Self-pay | Admitting: Internal Medicine

## 2015-05-18 ENCOUNTER — Ambulatory Visit (INDEPENDENT_AMBULATORY_CARE_PROVIDER_SITE_OTHER): Payer: Medicaid Other | Admitting: Internal Medicine

## 2015-05-18 ENCOUNTER — Ambulatory Visit (HOSPITAL_BASED_OUTPATIENT_CLINIC_OR_DEPARTMENT_OTHER): Payer: Medicaid Other | Admitting: Hematology and Oncology

## 2015-05-18 ENCOUNTER — Encounter: Payer: Self-pay | Admitting: Hematology and Oncology

## 2015-05-18 VITALS — BP 108/72 | HR 107 | Temp 98.0°F | Resp 18 | Ht 68.0 in

## 2015-05-18 VITALS — BP 128/64 | HR 110

## 2015-05-18 DIAGNOSIS — J449 Chronic obstructive pulmonary disease, unspecified: Secondary | ICD-10-CM | POA: Diagnosis not present

## 2015-05-18 DIAGNOSIS — R911 Solitary pulmonary nodule: Secondary | ICD-10-CM

## 2015-05-18 DIAGNOSIS — R932 Abnormal findings on diagnostic imaging of liver and biliary tract: Secondary | ICD-10-CM | POA: Diagnosis not present

## 2015-05-18 DIAGNOSIS — J984 Other disorders of lung: Secondary | ICD-10-CM | POA: Diagnosis not present

## 2015-05-18 DIAGNOSIS — R918 Other nonspecific abnormal finding of lung field: Secondary | ICD-10-CM

## 2015-05-18 DIAGNOSIS — R160 Hepatomegaly, not elsewhere classified: Secondary | ICD-10-CM

## 2015-05-18 MED ORDER — PANTOPRAZOLE SODIUM 40 MG PO TBEC: 40.0000 mg | DELAYED_RELEASE_TABLET | Freq: Every day | ORAL | Status: DC

## 2015-05-18 MED ORDER — FAMOTIDINE 20 MG PO TABS: ORAL_TABLET | ORAL | Status: DC

## 2015-05-18 MED ORDER — UMECLIDINIUM-VILANTEROL 62.5-25 MCG/INH IN AEPB: INHALATION_SPRAY | RESPIRATORY_TRACT | Status: DC

## 2015-05-18 NOTE — Progress Notes (Signed)
Subjective:     Patient ID: Wanda Prince, female   DOB: 20-Nov-1955,   MRN: [id number]  HPI  25 yowf quit smoking 04/21/15 with deteriorating sob x weeks > admit   Date of Admission: 04/22/2015   Date of Discharge: 05/07/2015 Attending Physician: Aldine Contes, MD  Discharge Diagnosis: Active Problems:  COPD with acute exacerbation ([place])  COPD exacerbation ([place])  Acute respiratory failure with hypoxemia ([place])  Encounter for imaging study to confirm orogastric (OG) tube placement  Encounter for nasogastric (NG) tube placement  Acute respiratory failure ([place])  Lung mass  Liver mass   05/18/2015 1st [facility] office visit/ Wanda Prince  maint rx spiriva/advair but very poor technique  Chief Complaint  Patient presents with  . Pulmonary Consult    Referred by Dr. Suzanna Obey. Pt states that she was dxed with COPD years ago. She c/o increased SOB for the past month. She is SOB just sitting and doing nothing and unable to exert herself much due to SOB.   baseline = used [employer] parking / sometimes used scooter sometimes not  / now sob at rest  Has flutter not using/ very congested cough but no purulent mucus  Sleeps on 2 pillows ok Very poor insight into meds/ flutter/ purse lip   No obvious day to day or daytime variability or assoc  cp or chest tightness, subjective wheeze or overt sinus or hb symptoms. No unusual exp hx or h/o childhood pna/ asthma or knowledge of premature birth.  Sleeping ok without nocturnal  or early am exacerbation  of respiratory  c/o's or need for noct saba. Also denies any obvious fluctuation of symptoms with weather or environmental changes or other aggravating or alleviating factors except as outlined above   Current Medications, Allergies, Complete Past Medical History, Past Surgical History, Family History, and Social History were reviewed in [employer] record.  ROS  The following are not active complaints unless bolded sore  throat, dysphagia, dental problems, itching, sneezing,  nasal congestion or excess/ purulent secretions, ear ache,   fever, chills, sweats, unintended wt loss, classically pleuritic or exertional cp, hemoptysis,  orthopnea pnd or leg swelling, presyncope, palpitations, abdominal pain, anorexia, nausea, vomiting, diarrhea  or change in bowel or bladder habits, change in stools or urine, dysuria,hematuria,  rash, arthralgias, visual complaints, headache, numbness, weakness or ataxia or problems with walking or coordination,  change in mood/affect or memory.          Review of Systems     Objective:   Physical Exam Thin anxious wf almost thrashing at times "to catch her breath" but not classic tripoding    Wt Readings from Last 3 Encounters:  05/07/15 105 lb 4.8 oz (47.764 kg)  01/12/14 115 lb 15.4 oz (52.6 kg)  09/29/12 135 lb (61.236 kg)    Vital signs reviewed   HEENT: nl dentition, turbinates, and oropharynx. Nl external ear canals without cough reflex   NECK :  without JVD/Nodes/TM/ nl carotid upstrokes bilaterally   LUNGS: no acc muscle use,  slt barrel contour chest  Distant bs/ mostly pseudowheeze better plm.  CV:  RRR  no s3 or murmur or increase in P2, no edema   ABD:  soft and nontender with  Mid to late [place]'s  the supine position. No bruits or organomegaly, bowel sounds nl  MS:  Nl gait/ ext warm without deformities, calf tenderness, cyanosis or clubbing No obvious joint restrictions   SKIN: warm and dry without lesions  NEURO:  alert, approp, nl sensorium with  no motor deficits    I personally reviewed images and agree with radiology impression as follows:  CT Chest   05/05/15  Negative for pulmonary embolism 15 mm spiculated cavitary mass right upper lobe, highly concerning for carcinoma of the lung.  5 mm nodule superior segment left lower lobe. This could represent a metastatic deposit or a benign nodule such as granuloma or scar. COPD with apical  emphysema. Cirrhotic liver with ascites.      Assessment:

## 2015-05-18 NOTE — Assessment & Plan Note (Signed)
RUL Lung mass 15 mm and LLL lung nodule 5 mm: Given his history of severe tobacco abuse and severe emphysema, I recommended obtaining a PET CT scan for further evaluation. Patient follows with Dr. Marlene Lard with pulmonology. I requested a PET/CT scan to be done within 2 weeks so that she will have the report when she meets with Dr. Marlene Lard. I discussed with her that Dr. Marlene Lard would be able to advise her and guide her regarding the findings on the PET CT scan. If she has biopsy-proven lung cancer, then we can consider radiation therapy as a potential treatment option.  I do not need to see her unless there is biopsy proven lung cancer. Patient will follow with Dr. Marlene Lard regarding her chronic shortness of breath and severe COPD.

## 2015-05-18 NOTE — Progress Notes (Signed)
[facility] CONSULT NOTE  Patient Care Team: Katherina Mires, MD as PCP - General (Family Medicine)  CHIEF COMPLAINTS/PURPOSE OF CONSULTATION:  Lung nodule and liver nodule  HISTORY OF PRESENTING ILLNESS:  Wanda Prince 60 y.o. female is here because of CT of the chest done while she was in the hospital revealed right upper lobe lung nodule measuring 15 mm and the left lower lobe lung nodule measuring 5 mm. Patient has a history of severe COPD but is currently not on oxygen therapy. She follows with Dr. Marlene Lard with pulmonology. She was sent to me to discuss the treatment options for the lung nodule which was suspected to be lung cancer because of her extensive tobacco abuse history. She was also referred to me for evaluation of the liver nodule which has remained unchanged over the past 6 months and her alpha-fetoprotein levels were not significantly elevated at 9. Patient also follows with the gastroenterologist Dr. Paulita Fujita for her liver issues.  I reviewed her records extensively and collaborated the history with the patient.   MEDICAL HISTORY:  Past Medical History  Diagnosis Date  . Meningitis 2009  . Hx of ventricular shunt 2009  . Seizure disorder ([place])   . 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 Suicide attempt [facility])     History of suicide attempts August 2006 and July 2006  . History of alcohol abuse   . History of cocaine abuse   . Major depressive disorder ([place])     History of  . History of iron deficiency   . COPD (chronic obstructive pulmonary disease) ([place])   . History of acute alcoholic hepatitis   . Psychiatric diagnosis     Multiple  . 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 Left breast mass     SURGICAL HISTORY: Past Surgical History  Procedure Laterality Date  . Craniotomy    . Breast  mass removal    . Brain surgery    . Breast surgery    . Left heart catheterization with coronary angiogram N/A 06/06/2011    Procedure: LEFT HEART CATHETERIZATION WITH CORONARY ANGIOGRAM;  Surgeon: Burnell Blanks, MD;  Location: [facility] CATH LAB;  Service: Cardiovascular;  Laterality: N/A;    SOCIAL HISTORY: Social History   Social History  . Marital Status: Legally Separated    Spouse Name: N/A  . Number of Children: N/A  . Years of Education: N/A   Occupational History  . Not on file.   Social History Main Topics  . Smoking status: Former Smoker -- 1.00 packs/day for 40 years    Types: Cigarettes    Quit date: 04/18/2015  . Smokeless tobacco: Not on file  . Alcohol Use: 4.8 oz/week    8 Cans of beer per week  . Drug Use: Yes    Special: Cocaine     Comment: not in years  . Sexual Activity: No   Other Topics Concern  . Not on file   Social History Narrative    FAMILY HISTORY: Family History  Problem Relation Age of Onset  . Coronary artery disease Father     ALLERGIES:  has No Known Allergies.  MEDICATIONS:  Current Outpatient Prescriptions  Medication Sig Dispense Refill  . acetaminophen (TYLENOL) 500 MG tablet Take 1,000 mg by mouth every 6 (six)  hours as needed for pain.    Marland Kitchen albuterol (PROVENTIL HFA;VENTOLIN HFA) 108 (90 BASE) MCG/ACT inhaler Inhale 2 puffs into the lungs every 4 (four) hours as needed for wheezing or shortness of breath.     Marland Kitchen albuterol (PROVENTIL) (2.5 MG/3ML) 0.083% nebulizer solution Take 3 mLs (2.5 mg total) by nebulization every 6 (six) hours as needed for wheezing or shortness of breath. 75 mL 12  . albuterol (PROVENTIL) (2.5 MG/3ML) 0.083% nebulizer solution Take 3 mLs (2.5 mg total) by nebulization every 2 (two) hours as needed for wheezing or shortness of breath. 75 mL 12  . aspirin 81 MG chewable tablet Chew 81 mg by mouth daily.    Marland Kitchen b complex vitamins tablet Take 1 tablet by mouth 2 (two) times daily. 62 tablet 0  .  cholecalciferol (VITAMIN D) 1000 UNITS tablet Take 1,000 Units by mouth daily.    . cyclobenzaprine (FLEXERIL) 5 MG tablet Take 5 mg by mouth every 8 (eight) hours as needed for muscle spasms.     . famotidine (PEPCID) 20 MG tablet One at bedtime 30 tablet 2  . folic acid (FOLVITE) 1 MG tablet Take 1 mg by mouth daily.    Marland Kitchen gabapentin (NEURONTIN) 300 MG capsule Take 300 mg by mouth at bedtime.     . levETIRAcetam (KEPPRA) 500 MG tablet Take 1 tablet (500 mg total) by mouth 2 (two) times daily. 62 tablet 0  . metoprolol succinate (TOPROL XL) 25 MG 24 hr tablet Take 25 mg by mouth daily.     . Multiple Vitamin (MULITIVITAMIN WITH MINERALS) TABS Take 1 tablet by mouth daily. 30 tablet 6  . naltrexone (DEPADE) 50 MG tablet Take 1 tablet (50 mg total) by mouth daily. 30 tablet 6  . nicotine (NICODERM CQ - DOSED IN MG/24 HOURS) 14 mg/24hr patch Place 1 patch (14 mg total) onto the skin daily. 28 patch 0  . nicotine polacrilex (NICORETTE) 4 MG gum Take 1 each (4 mg total) by mouth as needed for smoking cessation. 100 tablet 0  . pantoprazole (PROTONIX) 40 MG tablet Take 1 tablet (40 mg total) by mouth daily. Take 30-60 min before first meal of the day 30 tablet 2  . polyethylene glycol (MIRALAX / GLYCOLAX) packet Take 17 g by mouth daily. 14 each 0  . predniSONE (DELTASONE) 10 MG tablet Take '50mg'$  BID for 3 days, '40mg'$  BID for 3 days, '30mg'$  BID x 3 days, '20mg'$  BID x 3 days, '10mg'$  BID x 3 days, '10mg'$  QD x 3 days and then stop. 100 tablet 0  . spironolactone (ALDACTONE) 25 MG tablet Take 25 mg by mouth daily.     Marland Kitchen thiamine 100 MG tablet Take 1 tablet (100 mg total) by mouth daily. 30 tablet 6  . Umeclidinium-Vilanterol (ANORO ELLIPTA) 62.5-25 MCG/INH AEPB Only open the device one time and then take your two separate drags to be sure you get it all    . Vitamin D, Ergocalciferol, (DRISDOL) 50000 UNITS CAPS Take 50,000 Units by mouth every 7 (seven) days. On Mondays     No current facility-administered  medications for this visit.    REVIEW OF SYSTEMS:   Constitutional: Denies fevers, chills or abnormal night sweats Eyes: Denies blurriness of vision, double vision or watery eyes Ears, nose, mouth, throat, and face: Denies mucositis or sore throat Respiratory: severe shortness of breath with minimal exertion, severe fatigue Cardiovascular: Denies palpitation, chest discomfort or lower extremity swelling Gastrointestinal:  Denies nausea, heartburn or change in  bowel habits Skin: Denies abnormal skin rashes Lymphatics: Denies new lymphadenopathy or easy bruising Neurological:Denies numbness, tingling or new weaknesses Behavioral/Psych: Mood is stable, no new changes  All other systems were reviewed with the patient and are negative.  PHYSICAL EXAMINATION: ECOG PERFORMANCE STATUS: 2 - Symptomatic, <50% confined to bed  Filed Vitals:   05/18/15 1311  BP: 108/72  Pulse: 107  Temp: 98 F (36.7 C)  Resp: 18   Filed Weights    GENERAL:alert, no distress and comfortable SKIN: skin color, texture, turgor are normal, no rashes or significant lesions EYES: normal, conjunctiva are pink and non-injected, sclera clear OROPHARYNX:no exudate, no erythema and lips, buccal mucosa, and tongue normal  NECK: supple, thyroid normal size, non-tender, without nodularity LYMPH:  no palpable lymphadenopathy in the cervical, axillary or inguinal LUNGS:severely diminished breath sounds bilaterally HEART: regular rate & rhythm and no murmurs and no lower extremity edema ABDOMEN:abdomen soft, non-tender and normal bowel sounds Musculoskeletal:no cyanosis of digits and no clubbing  PSYCH: alert & oriented x 3 with fluent speech NEURO: Mild dementia   LABORATORY DATA:  I have reviewed the data as listed Lab Results  Component Value Date   WBC 15.8* 05/06/2015   HGB 13.2 05/06/2015   HCT 40.1 05/06/2015   MCV 96.4 05/06/2015   PLT 128* 05/06/2015   Lab Results  Component Value Date   NA 140  05/06/2015   K 4.4 05/06/2015   CL 98* 05/06/2015   CO2 36* 05/06/2015    ASSESSMENT AND PLAN:  Cavitating mass in right upper lung lobe RUL Lung mass 15 mm and LLL lung nodule 5 mm: Given his history of severe tobacco abuse and severe emphysema, I recommended obtaining a PET CT scan for further evaluation. Patient follows with Dr. Marlene Lard with pulmonology. I requested a PET/CT scan to be done within 2 weeks so that she will have the report when she meets with Dr. Marlene Lard. I discussed with her that Dr. Marlene Lard would be able to advise her and guide her regarding the findings on the PET CT scan. If she has biopsy-proven lung cancer, then we can consider radiation therapy as a potential treatment option. I reviewed the CT scan with the patient in great detail.    I do not need to see her unless there is biopsy proven lung cancer. Patient will follow with Dr. Marlene Lard regarding her chronic shortness of breath and severe COPD.  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 This has been stable over the past 6 months. AFP done in the hospital was 9. I discussed with the patient that agents with liver cancer and to have very significantly increased AFP levels and hence I do not believe that the patient has primary hepatocellular carcinoma.  Patient has a gastroenterologist Dr. Paulita Fujita and I have instructed her to continue to follow-up with her gastroenterologist for surveillance for liver cancer.  Return to clinic as needed   All questions were answered. The patient knows to call the clinic with any problems, questions or concerns.    Rulon Eisenmenger, MD 05/18/2015

## 2015-05-18 NOTE — Patient Instructions (Signed)
Pantoprazole (protonix) 40 mg   Take  30-60 min before first meal of the day and Pepcid (famotidine)  20 mg one @  bedtime until return to office - this is the best way to tell whether stomach acid is contributing to your problem.    GERD (REFLUX)  is an extremely common cause of respiratory symptoms just like yours , many times with no obvious heartburn at all.    It can be treated with medication, but also with lifestyle changes including elevation of the head of your bed (ideally with 6 inch  bed blocks),  Smoking cessation, avoidance of late meals, excessive alcohol, and avoid fatty foods, chocolate, peppermint, colas, red wine, and acidic juices such as orange juice.  NO MINT OR MENTHOL PRODUCTS SO NO COUGH DROPS  USE SUGARLESS CANDY INSTEAD (Jolley ranchers or Stover's or Life Savers) or even ice chips will also do - the key is to swallow to prevent all throat clearing. NO OIL BASED VITAMINS - use powdered substitutes.  Stop advair and spiriva and just take anoro 2 good drags each am / one click   For breathing >  Nebulizer with albuterol every 4 hours if needed (if you can't catch your breath)   For cough > flutter valve as much as you can   Please schedule a follow up office visit in 2  weeks, sooner if needed

## 2015-05-18 NOTE — Assessment & Plan Note (Signed)
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 This has been stable over the past 6 months. AFP done in the hospital was 9. I discussed with the patient that agents with liver cancer and to have very significantly increased AFP levels and hence I do not believe that the patient has primary hepatocellular carcinoma.  Patient has a gastroenterologist Dr. Paulita Fujita and I have instructed her to continue to follow-up with her gastroenterologist for surveillance for liver cancer.  Return to clinic as needed

## 2015-05-19 ENCOUNTER — Encounter: Payer: Self-pay | Admitting: Internal Medicine

## 2015-05-19 NOTE — Assessment & Plan Note (Addendum)
DDX of  difficult airways management almost all start with A and  include Adherence, Ace Inhibitors, Acid Reflux, Active Sinus Disease, Alpha 1 Antitripsin deficiency, Anxiety masquerading as Airways dz,  ABPA,  Allergy(esp in young), Aspiration (esp in elderly), Adverse effects of meds,  Active smokers, A bunch of PE's (a small clot burden can't cause this syndrome unless there is already severe underlying pulm or vascular dz with poor reserve) plus two Bs  = Bronchiectasis and Beta blocker use..and one C= CHF  Adherence is always the initial "prime suspect" and is a multilayered concern that requires a "trust but verify" approach in every patient - starting with knowing how to use medications, especially inhalers, correctly, keeping up with refills and understanding the fundamental difference between maintenance and prns vs those medications only taken for a very short course and then stopped and not refilled.  - - The proper method of use, as well as anticipated side effects, of a metered-dose inhaler are discussed and demonstrated to the patient. Improved effectiveness after extensive coaching during this visit to a level of approximately 0 % from a baseline of 0 % > abandon hfa rec anoro one click each am > 20% effective dpi/ use neb as backup   ? Active smoking > denies/ reinforced critical importance of maint abstinence  ? Allergy/ asthma component > strongly doubt > rec wean off pred  ? Acid (or non-acid) GERD > always difficult to exclude as up to 75% of pts in some series report no assoc GI/ Heartburn symptoms> rec max (24h)  acid suppression and diet restrictions/ reviewed and instructions given in writing.   ? Anxiety > usually at the bottom of this list of usual suspects but should be much higher on this pt's based on H and P    I had an extended discussion with the patient reviewing all relevant studies completed to date and  lasting 35 minutes of a 60 minute visit    Each maintenance  medication was reviewed in detail including most importantly the difference between maintenance and prns and under what circumstances the prns are to be triggered using an action plan format that is not reflected in the computer generated alphabetically organized AVS.    Please see instructions for details which were reviewed in writing and the patient given a copy highlighting the part that I personally wrote and discussed at today's ov.

## 2015-05-19 NOTE — Assessment & Plan Note (Addendum)
Although there are clearly abnormalities on CT scan, they should probably be considered "microscopic" since not obvious on plain cxr .     In the setting of obvious and severe  "macroscopic" health issues,  I am very reluctatnt to embark on an invasive w/u at this point but will arrange consevative  follow up and in the meantime see what we can do to address the patient's subjective concerns - see copd a/p  She is not a candidate for a bx at this point due to severity of sob (resting) and would do PET first to see if any other lesion more accessible but certainly this is low priority for now and is in now way contributing to her sob

## 2015-06-01 ENCOUNTER — Encounter: Payer: Self-pay | Admitting: Internal Medicine

## 2015-06-01 ENCOUNTER — Ambulatory Visit (INDEPENDENT_AMBULATORY_CARE_PROVIDER_SITE_OTHER): Payer: Medicaid Other | Admitting: Internal Medicine

## 2015-06-01 VITALS — BP 106/64 | HR 94 | Ht 68.0 in | Wt 112.0 lb

## 2015-06-01 DIAGNOSIS — J449 Chronic obstructive pulmonary disease, unspecified: Secondary | ICD-10-CM

## 2015-06-01 DIAGNOSIS — J984 Other disorders of lung: Secondary | ICD-10-CM | POA: Diagnosis not present

## 2015-06-01 NOTE — Progress Notes (Signed)
Subjective:     Patient ID: Wanda Prince, female   DOB: [birth year],   MRN: [id number]  HPI  38 yowf quit smoking 04/21/15 with deteriorating sob x weeks > admit   Date of Admission: 04/22/2015   Date of Discharge: 05/07/2015 Attending Physician: Aldine Contes, MD  Discharge Diagnosis: Active Problems:  COPD with acute exacerbation ([place])  COPD exacerbation ([place])  Acute respiratory failure with hypoxemia ([place])  Encounter for imaging study to confirm orogastric (OG) tube placement  Encounter for nasogastric (NG) tube placement  Acute respiratory failure ([place])  Lung mass  Liver mass   05/18/2015 1st  Pulmonary office visit/ Robyn Nohr  maint rx spiriva/advair but very poor technique  Chief Complaint  Patient presents with  . Pulmonary Consult    Referred by Dr. Suzanna Obey. Pt states that she was dxed with COPD years ago. She c/o increased SOB for the past month. She is SOB just sitting and doing nothing and unable to exert herself much due to SOB.   baseline = used [employer]c parking / sometimes used scooter sometimes not  / now sob at rest  Has flutter not using/ very congested cough but no purulent mucus  Sleeps on 2 pillows ok Very poor insight into meds/ flutter/ purse lip  rec Pantoprazole (protonix) 40 mg   Take  30-60 min before first meal of the day and Pepcid (famotidine)  20 mg one @  bedtime until return to office - this is the best way to tell whether stomach acid is contributing to your problem.   GERD  Diet   Stop advair and spiriva and just take anoro 2 good drags each am / one click  For breathing >  Nebulizer with albuterol every 4 hours if needed (if you can't catch your breath)  For cough > flutter valve as much as you can     06/01/2015  f/u ov/Brayton Baumgartner re: GOLD IV COPD maint on anoro Chief Complaint  Patient presents with  . Follow-up    Breathing has improved some. She is using albuterol inhaler 2 x daily and neb 1 x daily on average.    now able to walk at  HT leaning on cart/ no noct symptoms/ still not smoking     No obvious day to day or daytime variability or assoc excess/ purulent sputum or mucus plugs  cp or chest tightness, subjective wheeze or overt sinus or hb symptoms. No unusual exp hx or h/o childhood pna/ asthma or knowledge of premature birth.  Sleeping ok without nocturnal  or early am exacerbation  of respiratory  c/o's or need for noct saba. Also denies any obvious fluctuation of symptoms with weather or environmental changes or other aggravating or alleviating factors except as outlined above   Current Medications, Allergies, Complete Past Medical History, Past Surgical History, Family History, and Social History were reviewed in [employer] record.  ROS  The following are not active complaints unless bolded sore throat, dysphagia, dental problems, itching, sneezing,  nasal congestion or excess/ purulent secretions, ear ache,   fever, chills, sweats, unintended wt loss, classically pleuritic or exertional cp, hemoptysis,  orthopnea pnd or leg swelling, presyncope, palpitations, abdominal pain, anorexia, nausea, vomiting, diarrhea  or change in bowel or bladder habits, change in stools or urine, dysuria,hematuria,  rash, arthralgias, visual complaints, headache, numbness, weakness or ataxia or problems with walking or coordination,  change in mood/affect or memory.  Objective:   Physical Exam  Thin amb wf walks with cane    06/01/2015         112   05/07/15 105 lb 4.8 oz (47.764 kg)  01/12/14 115 lb 15.4 oz (52.6 kg)  09/29/12 135 lb (61.236 kg)    Vital signs reviewed   HEENT: nl dentition, turbinates, and oropharynx. Nl external ear canals without cough reflex   NECK :  without JVD/Nodes/TM/ nl carotid upstrokes bilaterally   LUNGS: no acc muscle use,  slt barrel contour chest  Distant bs marked increased Texp s true wheeze   CV:  RRR  no s3 or murmur or increase in P2, no edema    ABD:  soft and nontender with  Mid to late Hoover's  the supine position. No bruits or organomegaly, bowel sounds nl  MS:  Nl gait/ ext warm without deformities, calf tenderness, cyanosis or clubbing No obvious joint restrictions   SKIN: warm and dry without lesions    NEURO:  alert, approp, nl sensorium with  no motor deficits    I personally reviewed images and agree with radiology impression as follows:  CT Chest   05/05/15  Negative for pulmonary embolism 15 mm spiculated cavitary mass right upper lobe, highly concerning for carcinoma of the lung.  5 mm nodule superior segment left lower lobe. This could represent a metastatic deposit or a benign nodule such as granuloma or scar. COPD with apical emphysema. Cirrhotic liver with ascites.      Assessment:

## 2015-06-01 NOTE — Patient Instructions (Signed)
Plan A =  Autoamatic = Continue Anoro one click each am   Plan B=   Backup Only use your albuterol as a rescue medication to be used if you can't catch your breath by resting or doing a relaxed purse lip breathing pattern.  - The less you use it, the better it will work when you need it. - Ok to use up to 2 puffs  every 4 hours if you must but call for immediate appointment if use goes up over your usual need - Don't leave home without it !!  (think of it like the spare tire for your car)   Plan C = Crisis  Only use the nebulizer if you try the inhaler first and it doesn't work, ok to use up to every 4 hours if you must  Please schedule a follow up office visit in 6 weeks, call sooner if needed with pfts on return

## 2015-06-02 ENCOUNTER — Ambulatory Visit (HOSPITAL_COMMUNITY): Payer: Medicaid Other

## 2015-06-05 ENCOUNTER — Encounter: Payer: Self-pay | Admitting: Internal Medicine

## 2015-06-05 NOTE — Assessment & Plan Note (Addendum)
05/18/2015  extensive coaching HFA effectiveness = 0 - trial of Anoro  05/18/15 > better doe  06/01/2015  - Spirometry 06/01/2015  FEV1 0.54 (19%)  Ratio 39    Much more severe than I anticipated so will need a full set of pfts to confirm - unfortunately she likely stopped smoking because smoking stopped her - in other words, after her best day function had slipped beyond repair which means she is in no way a candidate for aggressive rx for lung nodule (see separate a/p)   I had an extended discussion with the patient reviewing all relevant studies completed to date and  lasting 15 to 20 minutes of a 25 minute visit    Each maintenance medication was reviewed in detail including most importantly the difference between maintenance and prns and under what circumstances the prns are to be triggered using an action plan format that is not reflected in the computer generated alphabetically organized AVS.    Please see instructions for details which were reviewed in writing and the patient given a copy highlighting the part that I personally wrote and discussed at today's ov.

## 2015-06-05 NOTE — Assessment & Plan Note (Signed)
See ct 05/05/15  - tickle file for f/u by 08/03/15   No options for now but follow conservatively pending f/u pfts then perhaps pet which might reveal a more accessible lesion

## 2015-06-07 ENCOUNTER — Emergency Department (HOSPITAL_COMMUNITY)
Admission: EM | Admit: 2015-06-07 | Discharge: 2015-06-07 | Disposition: A | Discharge status: Home / Self Care | Payer: Medicaid Other | Attending: Emergency Medicine | Admitting: Emergency Medicine

## 2015-06-07 ENCOUNTER — Emergency Department (HOSPITAL_COMMUNITY): Payer: Medicaid Other

## 2015-06-07 ENCOUNTER — Encounter (HOSPITAL_COMMUNITY): Payer: Self-pay | Admitting: Family Medicine

## 2015-06-07 DIAGNOSIS — Z87890 Personal history of sex reassignment: Secondary | ICD-10-CM | POA: Insufficient documentation

## 2015-06-07 DIAGNOSIS — K625 Hemorrhage of anus and rectum: Secondary | ICD-10-CM

## 2015-06-07 DIAGNOSIS — Z872 Personal history of diseases of the skin and subcutaneous tissue: Secondary | ICD-10-CM | POA: Diagnosis not present

## 2015-06-07 DIAGNOSIS — Q2730 Arteriovenous malformation, site unspecified: Secondary | ICD-10-CM | POA: Insufficient documentation

## 2015-06-07 DIAGNOSIS — K644 Residual hemorrhoidal skin tags: Secondary | ICD-10-CM | POA: Diagnosis not present

## 2015-06-07 DIAGNOSIS — I10 Essential (primary) hypertension: Secondary | ICD-10-CM | POA: Diagnosis not present

## 2015-06-07 DIAGNOSIS — K529 Noninfective gastroenteritis and colitis, unspecified: Secondary | ICD-10-CM | POA: Insufficient documentation

## 2015-06-07 DIAGNOSIS — Z79899 Other long term (current) drug therapy: Secondary | ICD-10-CM | POA: Diagnosis not present

## 2015-06-07 DIAGNOSIS — Z8742 Personal history of other diseases of the female genital tract: Secondary | ICD-10-CM | POA: Insufficient documentation

## 2015-06-07 DIAGNOSIS — Z9889 Other specified postprocedural states: Secondary | ICD-10-CM | POA: Diagnosis not present

## 2015-06-07 DIAGNOSIS — Z7982 Long term (current) use of aspirin: Secondary | ICD-10-CM | POA: Diagnosis not present

## 2015-06-07 DIAGNOSIS — Z8669 Personal history of other diseases of the nervous system and sense organs: Secondary | ICD-10-CM | POA: Diagnosis not present

## 2015-06-07 DIAGNOSIS — Z8639 Personal history of other endocrine, nutritional and metabolic disease: Secondary | ICD-10-CM | POA: Insufficient documentation

## 2015-06-07 DIAGNOSIS — Z79891 Long term (current) use of opiate analgesic: Secondary | ICD-10-CM | POA: Insufficient documentation

## 2015-06-07 DIAGNOSIS — J449 Chronic obstructive pulmonary disease, unspecified: Secondary | ICD-10-CM | POA: Diagnosis not present

## 2015-06-07 DIAGNOSIS — Z8659 Personal history of other mental and behavioral disorders: Secondary | ICD-10-CM | POA: Insufficient documentation

## 2015-06-07 HISTORY — DX: Essential (primary) hypertension: I10

## 2015-06-07 LAB — COMPREHENSIVE METABOLIC PANEL
ALT: 38 U/L (ref 14–54)
AST: 53 U/L — ABNORMAL HIGH (ref 15–41)
Albumin: 2.6 g/dL — ABNORMAL LOW (ref 3.5–5.0)
Alkaline Phosphatase: 136 U/L — ABNORMAL HIGH (ref 38–126)
Anion gap: 13 (ref 5–15)
BUN: 9 mg/dL (ref 6–20)
CO2: 25 mmol/L (ref 22–32)
Calcium: 8.5 mg/dL — ABNORMAL LOW (ref 8.9–10.3)
Chloride: 99 mmol/L — ABNORMAL LOW (ref 101–111)
Creatinine, Ser: 0.66 mg/dL (ref 0.44–1.00)
GFR calc Af Amer: >60 mL/min (ref >60)
GFR calc non Af Amer: >60 mL/min (ref >60)
Glucose, Bld: 146 mg/dL — ABNORMAL HIGH (ref 65–99)
Potassium: 3.8 mmol/L (ref 3.5–5.1)
Sodium: 137 mmol/L (ref 135–145)
Total Bilirubin: 1.4 mg/dL — ABNORMAL HIGH (ref 0.3–1.2)
Total Protein: 6.9 g/dL (ref 6.5–8.1)

## 2015-06-07 LAB — CBC
HCT: 45.6 % (ref 36.0–46.0)
Hemoglobin: 15.4 g/dL — ABNORMAL HIGH (ref 12.0–15.0)
MCH: 32.9 pg (ref 26.0–34.0)
MCHC: 33.8 g/dL (ref 30.0–36.0)
MCV: 97.4 fL (ref 78.0–100.0)
Platelets: 240 10*3/uL (ref 150–400)
RBC: 4.68 MIL/uL (ref 3.87–5.11)
RDW: 15.8 % — ABNORMAL HIGH (ref 11.5–15.5)
WBC: 13.8 10*3/uL — ABNORMAL HIGH (ref 4.0–10.5)

## 2015-06-07 LAB — TYPE AND SCREEN
ABO/RH(D): B POS
Antibody Screen: NEGATIVE

## 2015-06-07 LAB — POC OCCULT BLOOD, ED: Fecal Occult Bld: POSITIVE — AB

## 2015-06-07 LAB — ABO/RH: ABO/RH(D): B POS

## 2015-06-07 LAB — APTT: aPTT: 32 seconds (ref 24–37)

## 2015-06-07 LAB — PROTIME-INR
INR: 1.08 (ref 0.00–1.49)
Prothrombin Time: 14.2 seconds (ref 11.6–15.2)

## 2015-06-07 MED ORDER — ONDANSETRON 4 MG PO TBDP: 4.0000 mg | ORAL_TABLET | Freq: Three times a day (TID) | ORAL | Status: DC | PRN

## 2015-06-07 MED ORDER — METRONIDAZOLE 500 MG PO TABS: 500.0000 mg | ORAL_TABLET | Freq: Two times a day (BID) | ORAL | Status: DC

## 2015-06-07 MED ORDER — IOHEXOL 300 MG/ML  SOLN
100.0000 mL | Freq: Once | INTRAMUSCULAR | Status: AC | PRN
  Administered 2015-06-07: 100 mL via INTRAVENOUS

## 2015-06-07 MED ORDER — NICOTINE 21 MG/24HR TD PT24
21.0000 mg | MEDICATED_PATCH | Freq: Once | TRANSDERMAL | Status: DC
  Administered 2015-06-07: 21 mg via TRANSDERMAL
  Filled 2015-06-07: qty 1

## 2015-06-07 MED ORDER — CIPROFLOXACIN HCL 500 MG PO TABS: 500.0000 mg | ORAL_TABLET | Freq: Two times a day (BID) | ORAL | Status: DC

## 2015-06-07 MED ORDER — OXYCODONE-ACETAMINOPHEN 5-325 MG PO TABS: 1.0000 | ORAL_TABLET | ORAL | Status: DC | PRN

## 2015-06-07 MED ORDER — MORPHINE SULFATE (PF) 4 MG/ML IV SOLN
4.0000 mg | Freq: Once | INTRAVENOUS | Status: AC
Start: 2015-06-07 — End: 2015-06-07
  Administered 2015-06-07: 4 mg via INTRAVENOUS
  Filled 2015-06-07: qty 1

## 2015-06-07 NOTE — Discharge Instructions (Signed)
Take the prescribed medication as directed.  Use caution when taking percocet, it can make you sleepy/drowsy. We again saw the lung mass as well as liver abnormality which you know about already, just make sure these are followed up as normal. Follow-up with Dr. Benson Norway-- call his office to make appt. Return to the ED for new or worsening symptoms.

## 2015-06-07 NOTE — ED Provider Notes (Signed)
CSN: [id number]     Arrival date & time 06/07/15  0865 History   First MD Initiated Contact with Patient 06/07/15 1105     Chief Complaint  Patient presents with  . Rectal Bleeding     (Consider location/radiation/quality/duration/timing/severity/associated sxs/prior Treatment) Patient is a 60 y.o. female presenting with hematochezia. The history is provided by the patient and medical records.  Rectal Bleeding Associated symptoms: abdominal pain     60 year old female with history of alcohol abuse, cocaine abuse, depression, COPD, alcoholic hepatitis, presenting to the ED for abdominal pain and rectal bleeding. Patient states yesterday she had 2 episodes of bloody stools, today when she had a bowel movement this morning it was gross blood. She denies any passage of clots in her stool. Patient is currently on daily aspirin, no other anticoagulation. She states she has lower abdominal cramping across her entire lower abdomen. She denies any nausea or vomiting. She has been able to eat and drink normally. Patient had colonoscopy in 2011 by Dr. Benson Norway.  She was found to have diverticulosis as well as internal hemorrhoids at this time. She denies any current rectal pain. She's not had issues with her hemorrhoids in the past.  Of note, patient has lost approximately 20-30 pounds over the past 3 months. She states she has been diagnosed with a lung mass as well as a liver lesion, she states both of which are under close surveillance by outpatient physicians, no new treatments as of now.  VSS.  Past Medical History  Diagnosis Date  . Meningitis 2009  . Hx of ventricular shunt 2009  . Seizure disorder ([place])   . 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 Suicide attempt [facility]     History of suicide attempts August 2006 and July 2006  . History of alcohol abuse   . History of cocaine abuse   .  Major depressive disorder ([place])     History of  . History of iron deficiency   . COPD (chronic obstructive pulmonary disease) ([place])   . History of acute alcoholic hepatitis   . Psychiatric diagnosis     Multiple  . 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 Left breast mass    Past Surgical History  Procedure Laterality Date  . Craniotomy    . Breast mass removal    . Brain surgery    . Breast surgery    . Left heart catheterization with coronary angiogram N/A 06/06/2011    Procedure: LEFT HEART CATHETERIZATION WITH CORONARY ANGIOGRAM;  Surgeon: Burnell Blanks, MD;  Location: [facility] CATH LAB;  Service: Cardiovascular;  Laterality: N/A;   Family History  Problem Relation Age of Onset  . Coronary artery disease Father    Social History  Substance Use Topics  . Smoking status: Former Smoker -- 1.00 packs/day for 40 years    Types: Cigarettes    Quit date: 04/18/2015  . Smokeless tobacco: None  . Alcohol Use: 4.8 oz/week    8 Cans of beer per week   OB History    Gravida Para Term Preterm AB TAB SAB Ectopic Multiple Living   '2 1   1          '$ Review of Systems  Gastrointestinal: Positive for abdominal pain, blood in stool, hematochezia and anal bleeding.  All other systems  reviewed and are negative.     Allergies  Review of patient's allergies indicates no known allergies.  Home Medications   Prior to Admission medications   Medication Sig Start Date End Date Taking? Authorizing Provider  acetaminophen (TYLENOL) 500 MG tablet Take 1,000 mg by mouth every 6 (six) hours as needed for pain.    Historical Provider, MD  albuterol (PROVENTIL HFA;VENTOLIN HFA) 108 (90 BASE) MCG/ACT inhaler Inhale 2 puffs into the lungs every 4 (four) hours as needed for wheezing or shortness of breath.     Historical Provider, MD  albuterol (PROVENTIL) (2.5 MG/3ML) 0.083% nebulizer solution Take 3 mLs (2.5 mg total) by nebulization every 2  (two) hours as needed for wheezing or shortness of breath. 05/07/15   Norman Herrlich, MD  aspirin 81 MG chewable tablet Chew 81 mg by mouth daily.    Historical Provider, MD  b complex vitamins tablet Take 1 tablet by mouth 2 (two) times daily. 01/28/12   Eugenie Filler, MD  cholecalciferol (VITAMIN D) 1000 UNITS tablet Take 1,000 Units by mouth daily.    Historical Provider, MD  cyclobenzaprine (FLEXERIL) 5 MG tablet Take 5 mg by mouth every 8 (eight) hours as needed for muscle spasms.  11/22/13   Historical Provider, MD  famotidine (PEPCID) 20 MG tablet One at bedtime 05/18/15   Tanda Rockers, MD  folic acid (FOLVITE) 1 MG tablet Take 1 mg by mouth daily.    Historical Provider, MD  gabapentin (NEURONTIN) 300 MG capsule Take 300 mg by mouth at bedtime.     Historical Provider, MD  levETIRAcetam (KEPPRA) 500 MG tablet Take 1 tablet (500 mg total) by mouth 2 (two) times daily. 01/28/12   Eugenie Filler, MD  metoprolol succinate (TOPROL XL) 25 MG 24 hr tablet Take 25 mg by mouth daily.  09/30/13   Historical Provider, MD  Multiple Vitamin (MULITIVITAMIN WITH MINERALS) TABS Take 1 tablet by mouth daily. 06/07/11   Dorian Heckle, MD  naltrexone (DEPADE) 50 MG tablet Take 1 tablet (50 mg total) by mouth daily. 05/07/15   Norman Herrlich, MD  nicotine (NICODERM CQ - DOSED IN MG/24 HOURS) 14 mg/24hr patch Place 1 patch (14 mg total) onto the skin daily. 05/07/15   Norman Herrlich, MD  pantoprazole (PROTONIX) 40 MG tablet Take 1 tablet (40 mg total) by mouth daily. Take 30-60 min before first meal of the day 05/18/15   Tanda Rockers, MD  polyethylene glycol [facility] / GLYCOLAX) packet Take 17 g by mouth daily. 05/07/15   Norman Herrlich, MD  spironolactone (ALDACTONE) 25 MG tablet Take 25 mg by mouth daily.  12/08/13 06/01/15  Historical Provider, MD  thiamine 100 MG tablet Take 1 tablet (100 mg total) by mouth daily. 06/07/11   Dorian Heckle, MD  Umeclidinium-Vilanterol [facility] ELLIPTA) 62.5-25 MCG/INH AEPB Only open the  device one time and then take your two separate drags to be sure you get it all 05/18/15   Tanda Rockers, MD   BP 123/90 mmHg  Pulse 111  Temp(Src) 97.8 F (36.6 C) (Oral)  Resp 18  SpO2 96%   Physical Exam  Constitutional: She is oriented to person, place, and time. She appears well-developed and well-nourished.  thin  HENT:  Head: Normocephalic and atraumatic.  Mouth/Throat: Oropharynx is clear and moist.  Eyes: Conjunctivae and EOM are normal. Pupils are equal, round, and reactive to light.  Neck: Normal range of motion.  Cardiovascular: Normal rate, regular rhythm  and normal heart sounds.   Pulmonary/Chest: Effort normal and breath sounds normal.  Abdominal: Soft. Bowel sounds are normal. There is tenderness in the right lower quadrant and left lower quadrant. There is no rigidity, no guarding and no CVA tenderness.  Mild tenderness with cramping sensation of right left lower quadrants, no rebound or guarding, no peritonitis  Genitourinary: Rectal exam shows external hemorrhoid.  2 very small nonbleeding, nonthrombosed external hemorrhoids; no gross blood noted  Musculoskeletal: Normal range of motion.  Neurological: She is alert and oriented to person, place, and time.  Skin: Skin is warm and dry.  Psychiatric: She has a normal mood and affect.  Nursing note and vitals reviewed.   ED Course  Procedures (including critical care time) Labs Review Labs Reviewed  COMPREHENSIVE METABOLIC PANEL - Abnormal; Notable for the following:    Chloride 99 (*)    Glucose, Bld 146 (*)    Calcium 8.5 (*)    Albumin 2.6 (*)    AST 53 (*)    Alkaline Phosphatase 136 (*)    Total Bilirubin 1.4 (*)    All other components within normal limits  CBC - Abnormal; Notable for the following:    WBC 13.8 (*)    Hemoglobin 15.4 (*)    RDW 15.8 (*)    All other components within normal limits  POC OCCULT BLOOD, ED - Abnormal; Notable for the following:    Fecal Occult Bld POSITIVE (*)    All  other components within normal limits  PROTIME-INR  APTT  POC OCCULT BLOOD, ED  TYPE AND SCREEN  ABO/RH    Imaging Review Ct Abdomen Pelvis W Contrast  06/07/2015  CLINICAL DATA:  Lower abdominal pain with bloody stools beginning yesterday. No history of bowel disease or surgery. No history of cancer. Denies vomiting. EXAM: CT ABDOMEN AND PELVIS WITH CONTRAST TECHNIQUE: Multidetector CT imaging of the abdomen and pelvis was performed using the standard protocol following bolus administration of intravenous contrast. CONTRAST:  119m OMNIPAQUE IOHEXOL 300 MG/ML  SOLN COMPARISON:  None. FINDINGS: Lower chest: 2 cm nodular consolidation at the left lung base posteriorly. Lung bases otherwise clear. Hepatobiliary: Liver is cirrhotic with diffusely nodular contours. No suspicious mass or lesion within the liver. Gallbladder is significantly distended but there are no inflammatory changes to suggest acute cholecystitis. Common bile duct is mildly prominent but there is no intrahepatic bile duct dilatation or bile duct stone seen. Pancreas: No mass, inflammatory changes, or other significant abnormality. No pancreatic duct dilatation. Spleen: Within normal limits in size and appearance. Adrenals/Urinary Tract: Left adrenal calcifications. Right adrenal gland is unremarkable. Kidneys appear normal without mass, cyst, stone or hydronephrosis. No ureteral or bladder calculi identified. Stomach/Bowel: Fairly marked thickening of the walls of the sigmoid colon, approximately 15 cm segment of the sigmoid colon. Fairly extensive diverticulosis noted throughout the sigmoid colon and lower descending colon. More proximal colon is normal in caliber. Small bowel is normal in caliber and configuration throughout. Appendix is normal. Vascular/Lymphatic: Fairly extensive atherosclerotic calcifications of the normal- caliber abdominal aorta and aortic branch vessels. No acute-appearing vascular abnormality. Scattered small lymph  nodes. No pathologically enlarged lymph nodes seen within the abdomen or pelvis. Reproductive: No mass or other significant abnormality. Other: Small amount of free fluid within the lower pelvis. This is likely related to patient's intraperitoneal catheter, presumably ventriculoperitoneal shunt. No abscess collection identified. No free intraperitoneal air. Musculoskeletal: Degenerative changes of the lumbar spine, moderate in degree. No acute osseous abnormality. Superficial  soft tissues are unremarkable. IMPRESSION: 1. Fairly marked thickening of the walls of a 15 cm segment of the mid sigmoid colon. This may be due to diverticulitis, as there is extensive diverticulosis of the sigmoid colon, but given the extent of involvement this is more likely a colitis of infectious or inflammatory nature. Recommend follow-up CT at some point to ensure resolution and exclude the less likely possibility of this representing a colon cancer. 2. No associated bowel obstruction. 3. Cirrhotic liver. 4. Focal nodular consolidation within the left lower lobe posteriorly, measuring 2 cm greatest dimension, new compared to a recent chest CT 05/05/2015 suggesting nodular atelectasis. Please note that a 15 mm spiculated cavitary mass was identified within the right upper lobe on the recent chest CT, concerning for carcinoma of the lung. Electronically Signed   By: Franki Cabot M.D.   On: 06/07/2015 15:47   I have personally reviewed and evaluated these images and lab results as part of my medical decision-making.   EKG Interpretation None      MDM   Final diagnoses:  Colitis  Rectal bleeding   60 year old female here with lower abdominal cramping and rectal bleeding.  Patient is afebrile, nontoxic. She does have some mild tenderness of her right left lower quadrants without peritonitis. She has 2 small external hemorrhoids noted on exam without any active bleeding or signs of thrombosis. Labwork as above, her H&H is  stable. She does have a occult blood, no gross blood on exam.  Patient does have history of diverticulosis per colonoscopy with Dr. Benson Norway from 2011. Will obtain CT scan to evaluate for any potential complications.  CT scan with possible diverticulitis versus colitis. There is no evidence of perforation or abscess formation at this time.  There is again noted hepatic lesion as well as lung mass which is known to patient is currently being followed by her outpatient physicians.  Patient remains hemodynamically stable and I feel she can be adequately treated as an outpatient. We'll start her on Cipro, Flagyl, Percocet, and Zofran. She was encouraged to follow-up with Dr. Benson Norway to ensure diverticulitis/colitis has resolved. She will also follow-up with her pulmonologist and PCP regarding the known lesions in her lungs and liver.  Discussed plan with patient, he/she acknowledged understanding and agreed with plan of care.  Return precautions given for new or worsening symptoms.  Of note, patient had single O2 sat documented at 87%. Patient had no complaint of chest pain, shortness of breath, palpitations, dizziness, or weakness while here in the emergency department.  All of her other vital signs have remained stable, I suspect this is an erroneous value with poor waveform.  Larene Pickett, PA-C 06/07/15 [place], DO 06/11/15 2124

## 2015-06-07 NOTE — ED Notes (Signed)
Pt left ED at 1641.

## 2015-06-07 NOTE — ED Notes (Signed)
IV team unable to establish IV at this time.

## 2015-06-07 NOTE — ED Notes (Signed)
2nd IV team member at bedside.

## 2015-06-07 NOTE — ED Notes (Signed)
IV team at bedside 

## 2015-06-07 NOTE — ED Notes (Signed)
Pt here for a few days of abd cramping with rectal bleeding sts bright red.

## 2015-06-09 ENCOUNTER — Telehealth: Payer: Self-pay | Admitting: Internal Medicine

## 2015-06-09 MED ORDER — UMECLIDINIUM-VILANTEROL 62.5-25 MCG/INH IN AEPB: INHALATION_SPRAY | RESPIRATORY_TRACT | Status: DC

## 2015-06-09 NOTE — Telephone Encounter (Signed)
Called spoke with pt. Aware anoro has been sent in. nothing further needed

## 2015-06-13 ENCOUNTER — Emergency Department (HOSPITAL_COMMUNITY)
Admission: EM | Admit: 2015-06-13 | Discharge: 2015-06-13 | Disposition: A | Discharge status: Home / Self Care | Payer: Medicaid Other | Attending: Emergency Medicine | Admitting: Emergency Medicine

## 2015-06-13 ENCOUNTER — Encounter (HOSPITAL_COMMUNITY): Payer: Self-pay | Admitting: Neurology

## 2015-06-13 ENCOUNTER — Emergency Department (HOSPITAL_COMMUNITY): Payer: Medicaid Other

## 2015-06-13 DIAGNOSIS — Y9389 Activity, other specified: Secondary | ICD-10-CM | POA: Insufficient documentation

## 2015-06-13 DIAGNOSIS — S92414A Nondisplaced fracture of proximal phalanx of right great toe, initial encounter for closed fracture: Secondary | ICD-10-CM | POA: Insufficient documentation

## 2015-06-13 DIAGNOSIS — J449 Chronic obstructive pulmonary disease, unspecified: Secondary | ICD-10-CM | POA: Insufficient documentation

## 2015-06-13 DIAGNOSIS — W228XXA Striking against or struck by other objects, initial encounter: Secondary | ICD-10-CM | POA: Insufficient documentation

## 2015-06-13 DIAGNOSIS — Z7982 Long term (current) use of aspirin: Secondary | ICD-10-CM | POA: Insufficient documentation

## 2015-06-13 DIAGNOSIS — Y998 Other external cause status: Secondary | ICD-10-CM | POA: Insufficient documentation

## 2015-06-13 DIAGNOSIS — Z79899 Other long term (current) drug therapy: Secondary | ICD-10-CM | POA: Insufficient documentation

## 2015-06-13 DIAGNOSIS — S92911A Unspecified fracture of right toe(s), initial encounter for closed fracture: Secondary | ICD-10-CM

## 2015-06-13 DIAGNOSIS — D509 Iron deficiency anemia, unspecified: Secondary | ICD-10-CM | POA: Insufficient documentation

## 2015-06-13 DIAGNOSIS — Y9289 Other specified places as the place of occurrence of the external cause: Secondary | ICD-10-CM | POA: Diagnosis not present

## 2015-06-13 DIAGNOSIS — F17210 Nicotine dependence, cigarettes, uncomplicated: Secondary | ICD-10-CM | POA: Diagnosis not present

## 2015-06-13 DIAGNOSIS — I10 Essential (primary) hypertension: Secondary | ICD-10-CM | POA: Diagnosis not present

## 2015-06-13 DIAGNOSIS — G40909 Epilepsy, unspecified, not intractable, without status epilepticus: Secondary | ICD-10-CM | POA: Diagnosis not present

## 2015-06-13 DIAGNOSIS — S99921A Unspecified injury of right foot, initial encounter: Secondary | ICD-10-CM | POA: Diagnosis present

## 2015-06-13 MED ORDER — OXYCODONE-ACETAMINOPHEN 5-325 MG PO TABS: 2.0000 | ORAL_TABLET | Freq: Four times a day (QID) | ORAL | Status: DC | PRN

## 2015-06-13 NOTE — Discharge Instructions (Signed)
Toe Fracture °A toe fracture is a break in one of the toe bones (phalanges). °CAUSES °This condition may be caused by: °· Dropping a heavy object on your toe. °· Stubbing your toe. °· Overusing your toe or doing repetitive exercise. °· Twisting or stretching your toe out of place. °RISK FACTORS °This condition is more likely to develop in people who: °· Play contact sports. °· Have a bone disease. °· Have a low calcium level. °SYMPTOMS °The main symptoms of this condition are swelling and pain in the toe. The pain may get worse with standing or walking. Other symptoms include: °· Bruising. °· Stiffness. °· Numbness. °· A change in the way the toe looks. °· Broken bones that poke through the skin. °· Blood beneath the toenail. °DIAGNOSIS °This condition is diagnosed with a physical exam. You may also have X-rays. °TREATMENT  °Treatment for this condition depends on the type of fracture and its severity. Treatment may involve: °· Taping the broken toe to a toe that is next to it (buddy taping). This is the most common treatment for fractures in which the bone has not moved out of place (nondisplaced fracture). °· Wearing a shoe that has a wide, rigid sole to protect the toe and to limit its movement. °· Wearing a walking cast. °· Having a procedure to move the toe back into place. °· Surgery. This may be needed: °¨ If there are many pieces of broken bone that are out of place (displaced). °¨ If the toe joint breaks. °¨ If the bone breaks through the skin. °· Physical therapy. This is done to help regain movement and strength in the toe. °You may need follow-up X-rays to make sure that the bone is healing well and staying in position. °HOME CARE INSTRUCTIONS °If You Have a Cast: °· Do not stick anything inside the cast to scratch your skin. Doing that increases your risk of infection. °· Check the skin around the cast every day. Report any concerns to your health care provider. You may put lotion on dry skin around the  edges of the cast. Do not apply lotion to the skin underneath the cast. °· Do not put pressure on any part of the cast until it is fully hardened. This may take several hours. °· Keep the cast clean and dry. °Bathing °· Do not take baths, swim, or use a hot tub until your health care provider approves. Ask your health care provider if you can take showers. You may only be allowed to take sponge baths for bathing. °· If your health care provider approves bathing and showering, cover the cast or bandage (dressing) with a watertight plastic bag to protect it from water. Do not let the cast or dressing get wet. °Managing Pain, Stiffness, and Swelling °· If you do not have a cast, apply ice to the injured area, if directed. °¨ Put ice in a plastic bag. °¨ Place a towel between your skin and the bag. °¨ Leave the ice on for 20 minutes, 2-3 times per day. °· Move your toes often to avoid stiffness and to lessen swelling. °· Raise (elevate) the injured area above the level of your heart while you are sitting or lying down. °Driving °· Do not drive or operate heavy machinery while taking pain medicine. °· Do not drive while wearing a cast on a foot that you use for driving. °Activity °· Return to your normal activities as directed by your health care provider. Ask your health care   provider what activities are safe for you. °· Perform exercises daily as directed by your health care provider or physical therapist. °Safety °· Do not use the injured limb to support your body weight until your health care provider says that you can. Use crutches or other assistive devices as directed by your health care provider. °General Instructions °· If your toe was treated with buddy taping, follow your health care provider's instructions for changing the gauze and tape. Change it more often: °¨ The gauze and tape get wet. If this happens, dry the space between the toes. °¨ The gauze and tape are too tight and cause your toe to become pale  or numb. °· Wear a protective shoe as directed by your health care provider. If you were not given a protective shoe, wear sturdy, supportive shoes. Your shoes should not pinch your toes and should not fit tightly against your toes. °· Do not use any tobacco products, including cigarettes, chewing tobacco, or e-cigarettes. Tobacco can delay bone healing. If you need help quitting, ask your health care provider. °· Take medicines only as directed by your health care provider. °· Keep all follow-up visits as directed by your health care provider. This is important. °SEEK MEDICAL CARE IF: °· You have a fever. °· Your pain medicine is not helping. °· Your toe is cold. °· Your toe is numb. °· You still have pain after one week of rest and treatment. °· You still have pain after your health care provider has said that you can start walking again. °· You have pain, tingling, or numbness in your foot that is not going away. °SEEK IMMEDIATE MEDICAL CARE IF: °· You have severe pain. °· You have redness or inflammation in your toe that is getting worse. °· You have pain or numbness in your toe that is getting worse. °· Your toe turns blue. °  °This information is not intended to replace advice given to you by your health care provider. Make sure you discuss any questions you have with your health care provider. °  °Document Released: 04/12/2000 Document Revised: 01/04/2015 Document Reviewed: 02/09/2014 °[employer]vier Interactive Patient Education ©2016 [employer] ° °

## 2015-06-13 NOTE — ED Notes (Signed)
Pt reports hit her right great toe on her bed last night and think she may gave broken it. Has swelling to right great toe and bruising to top of right foot.

## 2015-06-13 NOTE — ED Provider Notes (Signed)
CSN: [id number]     Arrival date & time 06/13/15  1140 History  By signing my name below, I, Wanda Prince, attest that this documentation has been prepared under the direction and in the presence of non-physician practitioner, Montine Circle, PA-C. Electronically Signed: Evelene Prince, Scribe. 06/13/2015. 1:05 PM.    Chief Complaint  Patient presents with  . Toe Pain    The history is provided by the patient. No language interpreter was used.     HPI Comments:  Wanda Prince is a 60 y.o. female who presents to the Emergency Department complaining of moderate constant right great toe pain with associated surrounding swelling. Pt injured the toe last night on her bed and has had pain since. No alleviating factors noted. Pt has no other complaints or symptoms at this time.    Past Medical History  Diagnosis Date  . Meningitis 2009  . Hx of ventricular shunt 2009  . Seizure disorder ([place])   . 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 Suicide attempt [facility])     History of suicide attempts August 2006 and July 2006  . History of alcohol abuse   . History of cocaine abuse   . Major depressive disorder ([place])     History of  . History of iron deficiency   . COPD (chronic obstructive pulmonary disease) ([place])   . History of acute alcoholic hepatitis   . Psychiatric diagnosis     Multiple  . 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 Left breast mass   . Hypertension    Past Surgical History  Procedure Laterality Date  . Craniotomy    . Breast mass removal    . Brain surgery    . Breast surgery    . Left heart catheterization with coronary angiogram N/A 06/06/2011    Procedure: LEFT HEART CATHETERIZATION WITH CORONARY ANGIOGRAM;  Surgeon: Burnell Blanks, MD;  Location: [facility] CATH LAB;  Service: Cardiovascular;  Laterality:  N/A;   Family History  Problem Relation Age of Onset  . Coronary artery disease Father    Social History  Substance Use Topics  . Smoking status: Current Every Day Smoker -- 1.00 packs/day for 40 years    Types: Cigarettes    Last Attempt to Quit: 04/18/2015  . Smokeless tobacco: None  . Alcohol Use: 4.8 oz/week    8 Cans of beer per week   OB History    Gravida Para Term Preterm AB TAB SAB Ectopic Multiple Living   '2 1   1          '$ Review of Systems  Constitutional: Negative for fever and chills.  Respiratory: Negative for shortness of breath.   Cardiovascular: Negative for chest pain.      Allergies  Review of patient's allergies indicates no known allergies.  Home Medications   Prior to Admission medications   Medication Sig Start Date End Date Taking? Authorizing Provider  acetaminophen (TYLENOL) 500 MG tablet Take 1,000 mg by mouth every 6 (six) hours as needed for pain.    Historical Provider, MD  albuterol (PROVENTIL HFA;VENTOLIN HFA) 108 (90 BASE) MCG/ACT inhaler Inhale 2 puffs into the lungs every 4 (four) hours as needed for wheezing or shortness of breath.     Historical Provider, MD  albuterol (PROVENTIL) (2.5 MG/3ML) 0.083% nebulizer  solution Take 3 mLs (2.5 mg total) by nebulization every 2 (two) hours as needed for wheezing or shortness of breath. 05/07/15   Norman Herrlich, MD  aspirin 81 MG chewable tablet Chew 81 mg by mouth daily.    Historical Provider, MD  b complex vitamins tablet Take 1 tablet by mouth 2 (two) times daily. 01/28/12   Eugenie Filler, MD  cholecalciferol (VITAMIN D) 1000 UNITS tablet Take 1,000 Units by mouth daily.    Historical Provider, MD  ciprofloxacin (CIPRO) 500 MG tablet Take 1 tablet (500 mg total) by mouth every 12 (twelve) hours. 06/07/15   Larene Pickett, PA-C  cyclobenzaprine (FLEXERIL) 5 MG tablet Take 5 mg by mouth every 8 (eight) hours as needed for muscle spasms.  11/22/13   Historical Provider, MD  famotidine (PEPCID) 20  MG tablet One at bedtime Patient not taking: Reported on 06/07/2015 05/18/15   Tanda Rockers, MD  folic acid (FOLVITE) 1 MG tablet Take 1 mg by mouth daily.    Historical Provider, MD  gabapentin (NEURONTIN) 300 MG capsule Take 300 mg by mouth at bedtime.     Historical Provider, MD  levETIRAcetam (KEPPRA) 500 MG tablet Take 1 tablet (500 mg total) by mouth 2 (two) times daily. 01/28/12   Eugenie Filler, MD  metoprolol succinate (TOPROL XL) 25 MG 24 hr tablet Take 25 mg by mouth daily.  09/30/13   Historical Provider, MD  metroNIDAZOLE (FLAGYL) 500 MG tablet Take 1 tablet (500 mg total) by mouth 2 (two) times daily. 06/07/15   Larene Pickett, PA-C  Multiple Vitamin (MULITIVITAMIN WITH MINERALS) TABS Take 1 tablet by mouth daily. 06/07/11   Dorian Heckle, MD  naltrexone (DEPADE) 50 MG tablet Take 1 tablet (50 mg total) by mouth daily. 05/07/15   Norman Herrlich, MD  nicotine (NICODERM CQ - DOSED IN MG/24 HOURS) 14 mg/24hr patch Place 1 patch (14 mg total) onto the skin daily. 05/07/15   Norman Herrlich, MD  ondansetron (ZOFRAN ODT) 4 MG disintegrating tablet Take 1 tablet (4 mg total) by mouth every 8 (eight) hours as needed for nausea. 06/07/15   Larene Pickett, PA-C  oxyCODONE-acetaminophen (PERCOCET/ROXICET) 5-325 MG tablet Take 1 tablet by mouth every 4 (four) hours as needed. 06/07/15   Larene Pickett, PA-C  pantoprazole (PROTONIX) 40 MG tablet Take 1 tablet (40 mg total) by mouth daily. Take 30-60 min before first meal of the day 05/18/15   Tanda Rockers, MD  polyethylene glycol [facility] / GLYCOLAX) packet Take 17 g by mouth daily. Patient not taking: Reported on 06/07/2015 05/07/15   Norman Herrlich, MD  spironolactone (ALDACTONE) 25 MG tablet Take 25 mg by mouth daily.  12/08/13 06/01/15  Historical Provider, MD  thiamine 100 MG tablet Take 1 tablet (100 mg total) by mouth daily. 06/07/11   Dorian Heckle, MD  Umeclidinium-Vilanterol [facility] ELLIPTA) 62.5-25 MCG/INH AEPB 1 puff once daily 06/09/15   Tanda Rockers, MD    BP 105/91 mmHg  Pulse 105  Temp(Src) 98 F (36.7 C) (Oral)  Resp 18  Wt 110 lb (49.896 kg)  SpO2 92% Physical Exam  Physical Exam  Constitutional: Pt appears well-developed and well-nourished. No distress.  HENT:  Head: Normocephalic and atraumatic.  Eyes: Conjunctivae are normal.  Neck: Normal range of motion.  Cardiovascular: Normal rate, regular rhythm and intact distal pulses.   Capillary refill < 3 sec  Pulmonary/Chest: Effort normal and breath sounds normal.  Musculoskeletal: Pt  exhibits tenderness at right first MTP. Pt exhibits mild edema and moderate contusion.  ROM:3/5 secondary to pain   Neurological: Pt  is alert. Coordination normal.  Sensation 5/5 Strength 3/5 secondary to pain   Skin: Skin is warm and dry. Pt is not diaphoretic.  No tenting of the skin  Psychiatric: Pt has a normal mood and affect.  Nursing note and vitals reviewed.  ED Course  Procedures   DIAGNOSTIC STUDIES:  Oxygen Saturation is 92% on RA, low by my interpretation.    COORDINATION OF CARE:  1:05 PM Pt updated with results.  Discussed treatment plan with pt at bedside and pt agreed to plan.  Imaging Review Dg Foot Complete Right  06/13/2015  CLINICAL DATA:  Hit medial aspect of foot against solid object EXAM: RIGHT FOOT COMPLETE - 3+ VIEW COMPARISON:  None. FINDINGS: Frontal, oblique, and lateral views were obtained. There is a nondisplaced transversely oriented fracture of the proximal aspect of the first proximal phalanx. Alignment is anatomic. No other fracture. No dislocation. There is mild narrowing of all PIP and DIP joints as well as the first MTP joint. No erosive change peer IMPRESSION: Nondisplaced fracture, proximal aspect first proximal phalanx. No other fracture. No dislocation. Narrowing of multiple distal joints. No erosive change. Electronically Signed   By: Lowella Grip III M.D.   On: 06/13/2015 12:55   I have personally reviewed and evaluated these images and lab  results as part of my medical decision-making.    MDM   Final diagnoses:  Toe fracture, right, closed, initial encounter    Closed toe fracture, not displaced.  Buddy tape, post op shoe, pain meds, PCP follow-up.  I personally performed the services described in this documentation, which was scribed in my presence. The recorded information has been reviewed and is accurate.      Montine Circle, PA-C 06/13/15 1340  Elnora Morrison, MD 06/13/15 1343

## 2015-06-14 ENCOUNTER — Ambulatory Visit (HOSPITAL_COMMUNITY): Payer: Medicaid Other

## 2015-06-16 IMAGING — US US ABDOMEN COMPLETE W/ ELASTOGRAPHY
1 series · 13 of 25 positions shown · non-contrast
Comparison: None.

CLINICAL DATA: Hepatitis C



[Series 1: us abdomen complete w/ elastography · 0.14mm/px · 13 of 109 slices shown]
[im 1/109]
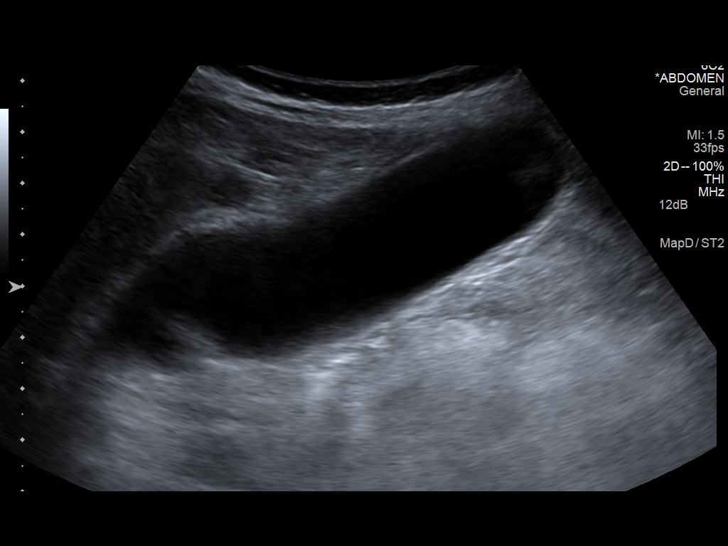
[im 10/109]
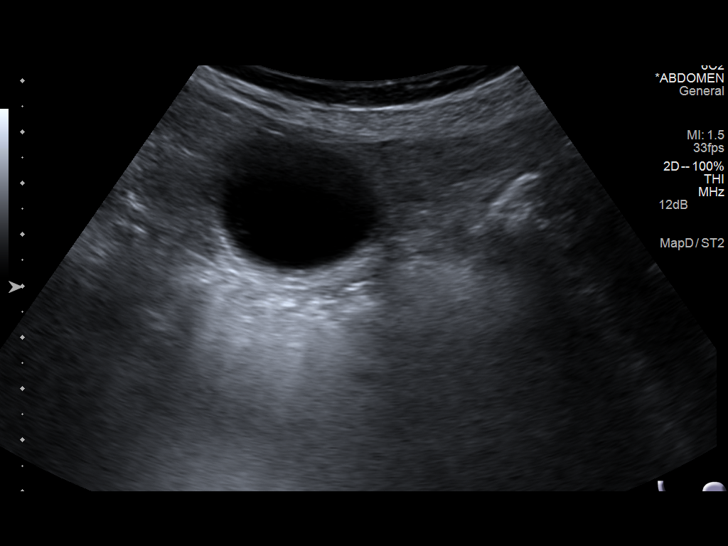
[im 19/109]
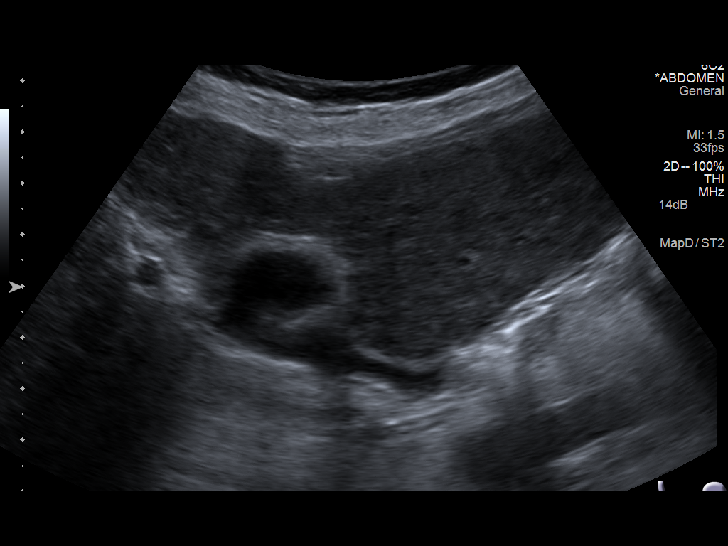
[im 28/109]
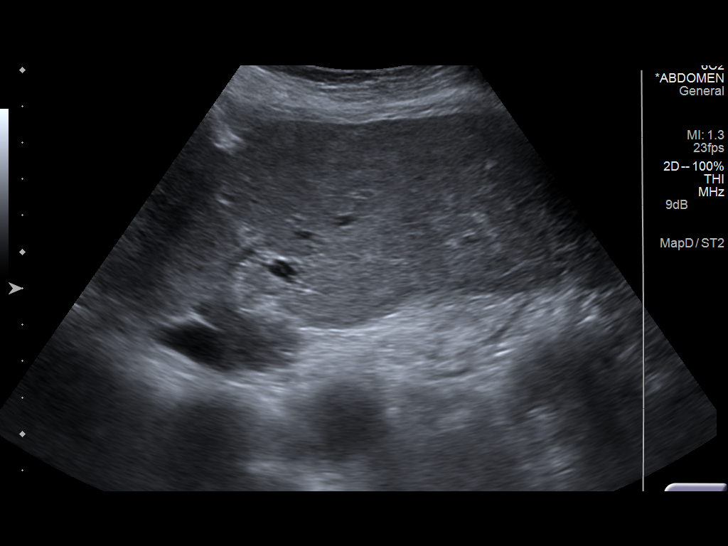
[im 37/109]
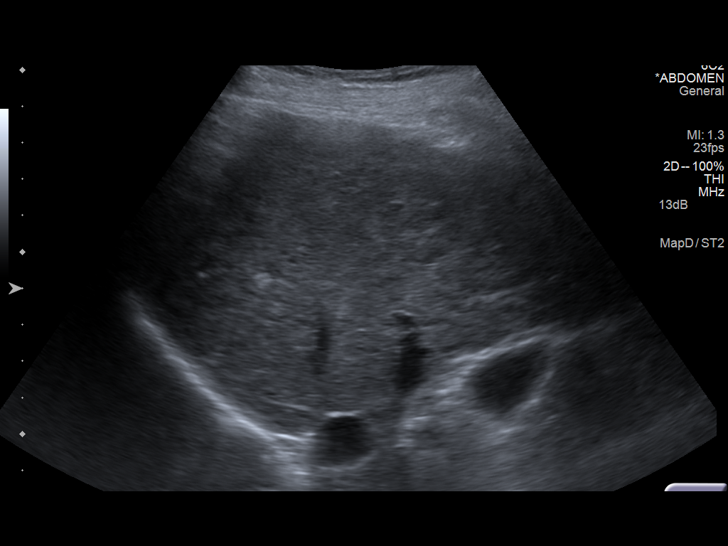
[im 46/109]
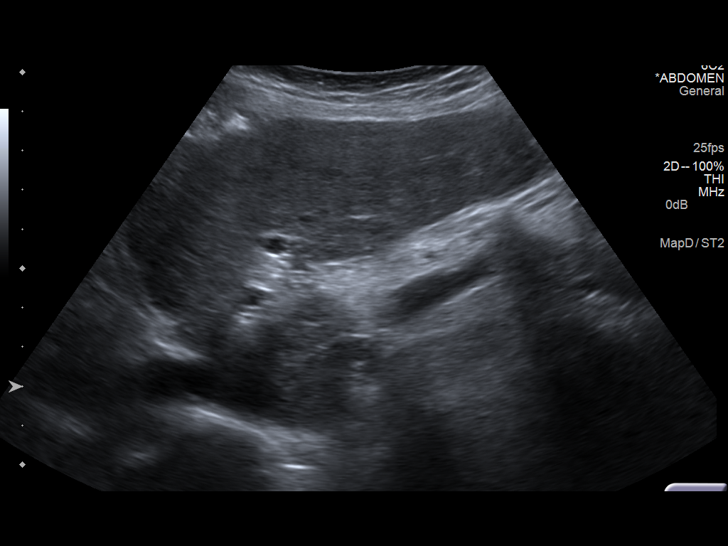
[im 55/109]
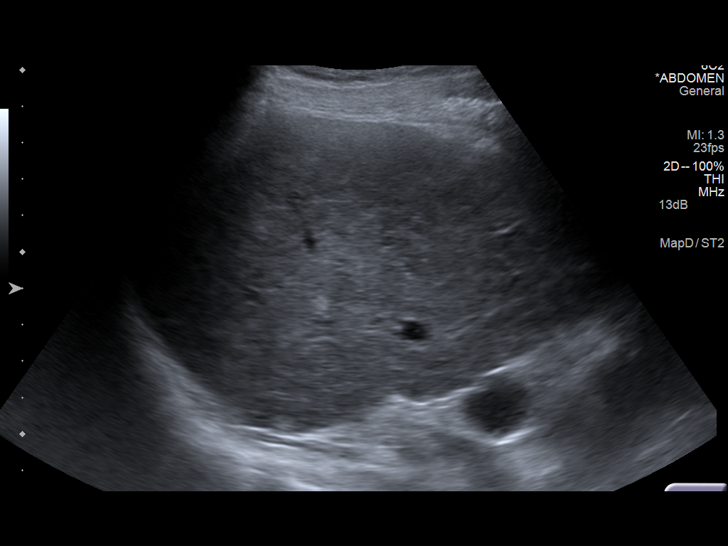
[im 64/109]
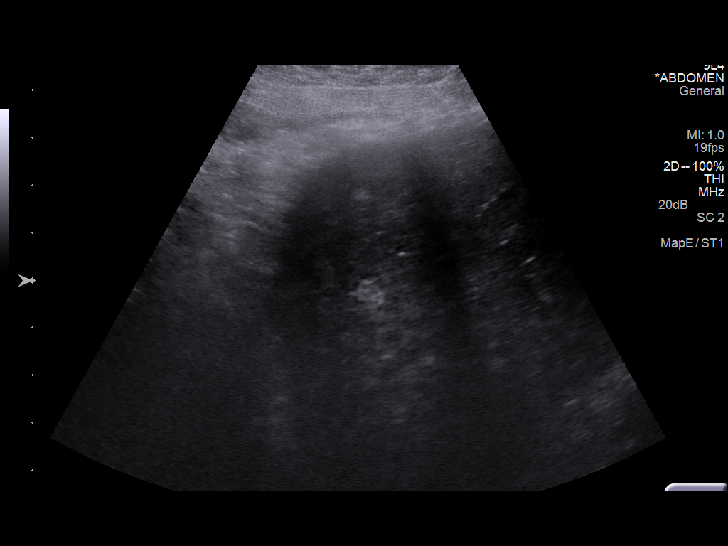
[im 73/109]
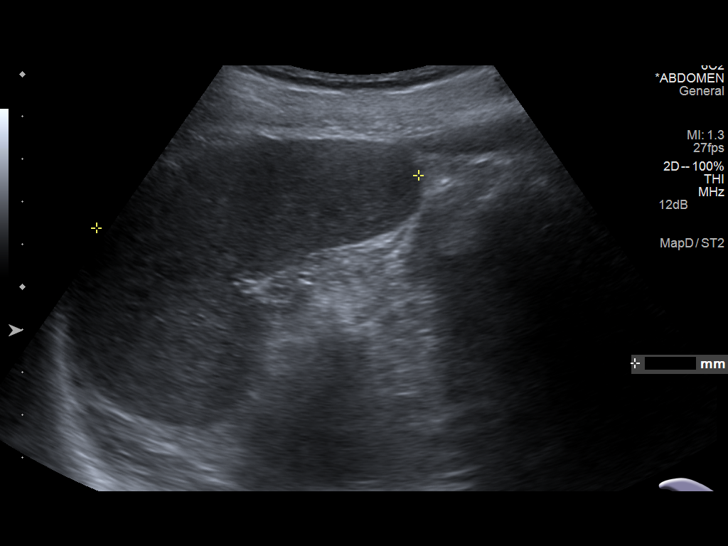
[im 82/109]
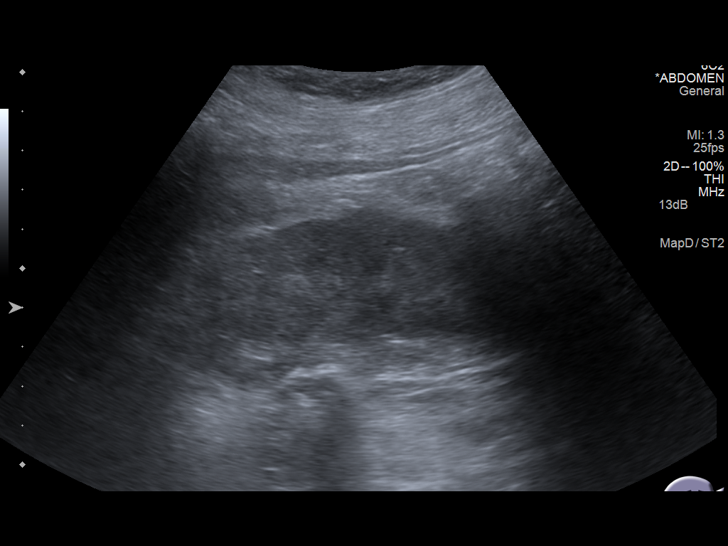
[im 91/109]
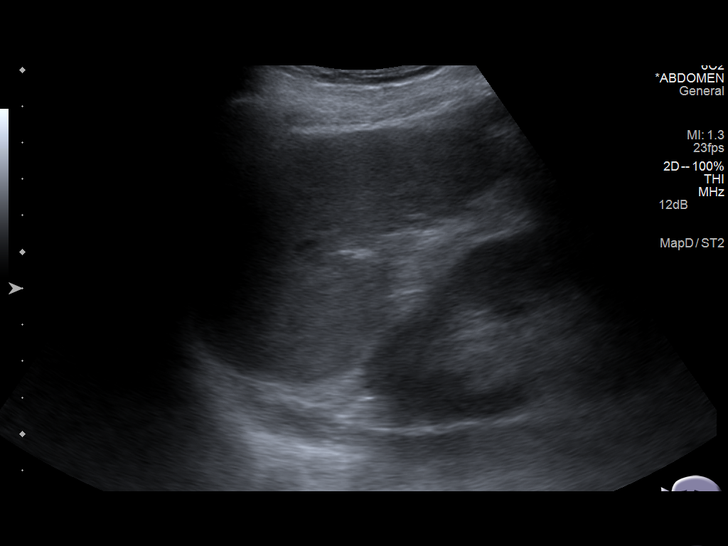
[im 100/109]
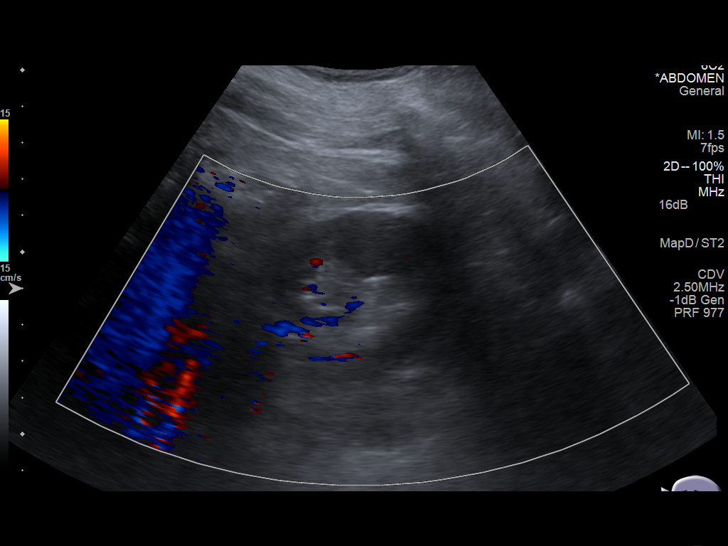
[im 109/109]
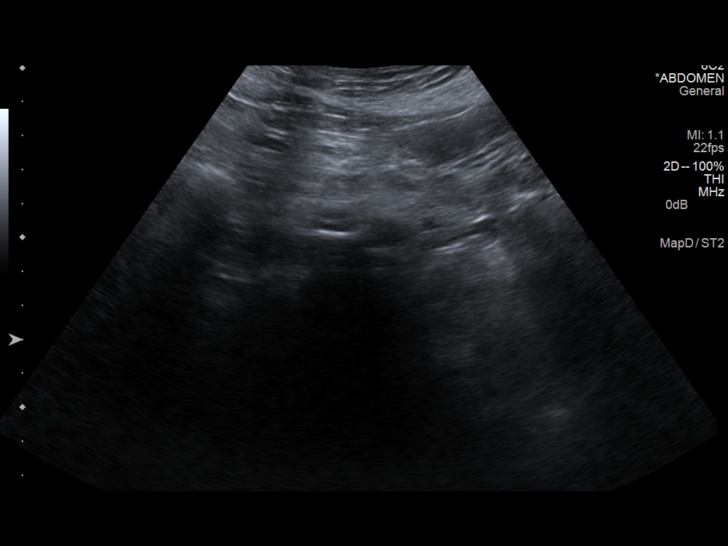

[13 of 25 positions shown; findings below may reference images not displayed]

FINDINGS: ULTRASOUND ABDOMEN

Gallbladder: No gallstones, gallbladder wall thickening, or
pericholecystic fluid. Negative sonographic Murphy's sign.

Common bile duct: Diameter: 4 mm

Liver: Coarse hepatic echotexture with nodular hepatic contour.
Multiple echogenic foci measuring up to 7 mm (image 57).

IVC: No abnormality visualized.

Pancreas: Visualized portion unremarkable.

Spleen: Size and appearance within normal limits.

Right Kidney: Length: 10.9 cm. No mass or hydronephrosis.

Left Kidney: Length: 10.5 cm. No mass or hydronephrosis.

Abdominal aorta: No aneurysm visualized.

Other findings: None.

ULTRASOUND HEPATIC ELASTOGRAPHY

Device: Siemens Helix VTQ

Transducer 4V1

Patient position: Left lower decubitus

Number of measurements:  10

Hepatic Segment:  8

Median velocity:   3.72  m/sec

IQR:

IQR/Median velocity ratio

Corresponding Metavir fibrosis score:  F3/F4

Risk of fibrosis: High

Limitations of exam: None

Pertinent findings noted on other imaging exams:  None

Please note that abnormal shear wave velocities may also be
identified in clinical settings other than with hepatic fibrosis,
such as: acute hepatitis, elevated right heart and central venous
pressures including use of beta blockers, Johnmartin disease
([place]), infiltrative processes such as
mastocytosis/amyloidosis/infiltrative tumor, extrahepatic
cholestasis, in the post-prandial state, and liver transplantation.
Correlation with patient history, laboratory data, and clinical
condition recommended.
IMPRESSION: Coarse hepatic echotexture with nodular hepatic contour, suggesting
cirrhosis.

Multiple echogenic hepatic lesions measuring up to 7 mm,
indeterminate. While these may reflect benign hemangiomas,
dysplastic nodules or metastases could have a similar appearance.
Consider MRI abdomen with/without contrast for further evaluation.

Median hepatic shear wave velocity is calculated at 3.72 m/sec.

Corresponding Metavir fibrosis score is F3/F4.

Risk of fibrosis is high.

Follow-up:  Follow-up advised.

## 2015-06-22 ENCOUNTER — Encounter (HOSPITAL_COMMUNITY)
Admission: RE | Admit: 2015-06-22 | Discharge: 2015-06-22 | Disposition: A | Discharge status: Home / Self Care | Payer: Medicaid Other | Source: Ambulatory Visit | Attending: Hematology and Oncology | Admitting: Hematology and Oncology

## 2015-06-22 DIAGNOSIS — R918 Other nonspecific abnormal finding of lung field: Secondary | ICD-10-CM | POA: Diagnosis present

## 2015-06-22 LAB — GLUCOSE, CAPILLARY: Glucose-Capillary: 84 mg/dL (ref 65–99)

## 2015-06-22 MED ORDER — FLUDEOXYGLUCOSE F - 18 (FDG) INJECTION
5.5000 | Freq: Once | INTRAVENOUS | Status: AC | PRN
  Administered 2015-06-22: 5.5 via INTRAVENOUS

## 2015-06-28 ENCOUNTER — Other Ambulatory Visit: Payer: Self-pay | Admitting: *Deleted

## 2015-06-28 ENCOUNTER — Telehealth: Payer: Self-pay | Admitting: Hematology and Oncology

## 2015-06-28 NOTE — Telephone Encounter (Signed)
spokw with patient sister to confirm appt date/time per 3/1 pof

## 2015-07-03 NOTE — Assessment & Plan Note (Signed)
RUL Lung mass 15 mm and LLL lung nodule 5 mm: Given his history of severe tobacco abuse and severe emphysema  PET 06/22/2015 1. 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 2. Mildly hypermetabolic nodule of the LEFT lung base which is decreased slightly in size in short interval favors infectious or inflammatory process. 3. No mediastinal lymphadenopathy metastatic mild hypermetabolic mediastinal adenopathy.  Patient is not a candidate for surgery.

## 2015-07-04 ENCOUNTER — Encounter: Payer: Self-pay | Admitting: Hematology and Oncology

## 2015-07-04 ENCOUNTER — Ambulatory Visit (HOSPITAL_BASED_OUTPATIENT_CLINIC_OR_DEPARTMENT_OTHER): Payer: Medicaid Other | Admitting: Hematology and Oncology

## 2015-07-04 ENCOUNTER — Telehealth: Payer: Self-pay | Admitting: Hematology and Oncology

## 2015-07-04 VITALS — BP 136/81 | HR 104 | Temp 97.5°F | Resp 18 | Ht 68.0 in | Wt 107.7 lb

## 2015-07-04 DIAGNOSIS — R918 Other nonspecific abnormal finding of lung field: Secondary | ICD-10-CM

## 2015-07-04 DIAGNOSIS — J449 Chronic obstructive pulmonary disease, unspecified: Secondary | ICD-10-CM

## 2015-07-04 DIAGNOSIS — J984 Other disorders of lung: Secondary | ICD-10-CM

## 2015-07-04 DIAGNOSIS — Z720 Tobacco use: Secondary | ICD-10-CM

## 2015-07-04 NOTE — Progress Notes (Signed)
Patient Care Team: Katherina Mires, MD as PCP - General (Family Medicine)  DIAGNOSIS: Lung mass and liver mass  CHIEF COMPLIANT: Follow-up of PET/CT scan  INTERVAL HISTORY: Wanda Prince is a  60 year old with above-mentioned history of heavy tobacco abuse and continuing tobacco abuse who had a right upper lobe lung nodule 15 mm in size along with a left lower lobe nodule measuring 5 mm. She has severe COPD. She underwent CT of the chest and more recently underwent a PET CT scan. The PET scan showed hypermetabolic activity in the right upper lobe lung nodule measuring an SUV of 7.2. The left lower lobe nodule has very mild uptake at 1.8. She has an appointment to see Dr. Melvyn Novas end of this month. She was felt not to be a good candidate for either surgery or chemotherapy. She is accompanied today by her friend.  REVIEW OF SYSTEMS:   Constitutional: Denies fevers, chills or abnormal weight loss Eyes: Denies blurriness of vision Ears, nose, mouth, throat, and face: Denies mucositis or sore throat Respiratory: Chronic shortness of breath from tobacco abuse. Cardiovascular: Denies palpitation, chest discomfort Gastrointestinal:  Denies nausea, heartburn or change in bowel habits Skin: Denies abnormal skin rashes Lymphatics: Denies new lymphadenopathy or easy bruising Neurological:Denies numbness, tingling or new weaknesses Behavioral/Psych: Mood is stable, no new changes  Extremities: No lower extremity edema  All other systems were reviewed with the patient and are negative.  I have reviewed the past medical history, past surgical history, social history and family history with the patient and they are unchanged from previous note.  ALLERGIES:  has No Known Allergies.  MEDICATIONS:  Current Outpatient Prescriptions  Medication Sig Dispense Refill  . acetaminophen (TYLENOL) 500 MG tablet Take 1,000 mg by mouth every 6 (six) hours as needed for pain.    Marland Kitchen albuterol (PROVENTIL HFA;VENTOLIN  HFA) 108 (90 BASE) MCG/ACT inhaler Inhale 2 puffs into the lungs every 4 (four) hours as needed for wheezing or shortness of breath.     Marland Kitchen albuterol (PROVENTIL) (2.5 MG/3ML) 0.083% nebulizer solution Take 3 mLs (2.5 mg total) by nebulization every 2 (two) hours as needed for wheezing or shortness of breath. 75 mL 12  . aspirin 81 MG chewable tablet Chew 81 mg by mouth daily.    Marland Kitchen b complex vitamins tablet Take 1 tablet by mouth 2 (two) times daily. 62 tablet 0  . cholecalciferol (VITAMIN D) 1000 UNITS tablet Take 1,000 Units by mouth daily.    . cyclobenzaprine (FLEXERIL) 5 MG tablet Take 5 mg by mouth every 8 (eight) hours as needed for muscle spasms.     . famotidine (PEPCID) 20 MG tablet One at bedtime (Patient not taking: Reported on 06/07/2015) 30 tablet 2  . folic acid (FOLVITE) 1 MG tablet Take 1 mg by mouth daily.    Marland Kitchen gabapentin (NEURONTIN) 300 MG capsule Take 300 mg by mouth at bedtime.     . levETIRAcetam (KEPPRA) 500 MG tablet Take 1 tablet (500 mg total) by mouth 2 (two) times daily. 62 tablet 0  . metoprolol succinate (TOPROL XL) 25 MG 24 hr tablet Take 25 mg by mouth daily.     . Multiple Vitamin (MULITIVITAMIN WITH MINERALS) TABS Take 1 tablet by mouth daily. 30 tablet 6  . nicotine (NICODERM CQ - DOSED IN MG/24 HOURS) 14 mg/24hr patch Place 1 patch (14 mg total) onto the skin daily. 28 patch 0  . ondansetron (ZOFRAN ODT) 4 MG disintegrating tablet Take 1 tablet (  4 mg total) by mouth every 8 (eight) hours as needed for nausea. 10 tablet 0  . spironolactone (ALDACTONE) 25 MG tablet Take 25 mg by mouth daily.     Marland Kitchen thiamine 100 MG tablet Take 1 tablet (100 mg total) by mouth daily. 30 tablet 6  . Umeclidinium-Vilanterol (ANORO ELLIPTA) 62.5-25 MCG/INH AEPB 1 puff once daily 60 each 6   No current facility-administered medications for this visit.    PHYSICAL EXAMINATION: ECOG PERFORMANCE STATUS: 1 - Symptomatic but completely ambulatory  Filed Vitals:   07/04/15 1435  BP: 136/81    Pulse: 104  Temp: 97.5 F (36.4 C)  Resp: 18   Filed Weights   07/04/15 1435  Weight: 107 lb 11.2 oz (48.852 kg)    GENERAL:alert, no distress and comfortable SKIN: skin color, texture, turgor are normal, no rashes or significant lesions EYES: normal, Conjunctiva are pink and non-injected, sclera clear OROPHARYNX:no exudate, no erythema and lips, buccal mucosa, and tongue normal  NECK: supple, thyroid normal size, non-tender, without nodularity LYMPH:  no palpable lymphadenopathy in the cervical, axillary or inguinal LUNGS: clear to auscultation and percussion with normal breathing effort HEART: regular rate & rhythm and no murmurs and no lower extremity edema ABDOMEN:abdomen soft, non-tender and normal bowel sounds MUSCULOSKELETAL:no cyanosis of digits and no clubbing  NEURO: alert & oriented x 3 with fluent speech, no focal motor/sensory deficits EXTREMITIES: No lower extremity edema  LABORATORY DATA:  I have reviewed the data as listed   Chemistry      Component Value Date/Time   NA 137 06/07/2015 0951   K 3.8 06/07/2015 0951   CL 99* 06/07/2015 0951   CO2 25 06/07/2015 0951   BUN 9 06/07/2015 0951   CREATININE 0.66 06/07/2015 0951      Component Value Date/Time   CALCIUM 8.5* 06/07/2015 0951   ALKPHOS 136* 06/07/2015 0951   AST 53* 06/07/2015 0951   ALT 38 06/07/2015 0951   BILITOT 1.4* 06/07/2015 0951       Lab Results  Component Value Date   WBC 13.8* 06/07/2015   HGB 15.4* 06/07/2015   HCT 45.6 06/07/2015   MCV 97.4 06/07/2015   PLT 240 06/07/2015   NEUTROABS 7.1 04/22/2015   ASSESSMENT & PLAN:  Cavitating mass in right upper lung lobe RUL Lung mass 15 mm and LLL lung nodule 5 mm: With history of severe tobacco abuse and severe emphysema,very concerning for primary lung cancer (PET/CT suggested this is suggestive of squamous cell carcinoma)    PET 06/22/2015 1. Hypermetabolic RIGHT upper lobe nodule is consistent with bronchogenic carcinoma.  Morphologically lesion suggest squamous cell carcinoma. 2. Mildly hypermetabolic nodule of the LEFT lung base which is decreased slightly in size in short interval favors infectious or inflammatory process. 3. No mediastinal lymphadenopathy metastatic mild hypermetabolic mediastinal adenopathy.  Patient is not a candidate for surgery. Patient is not a candidate for chemotherapy either.  I will refer the patient to radiation oncology for evaluation.  Return to clinic in 3 months to assess how her treatment has progressed. Orders Placed This Encounter  Procedures  . Ambulatory referral to Radiation Oncology    Referral Priority:  Routine    Referral Type:  Consultation    Referral Reason:  Specialty Services Required    Requested Specialty:  Radiation Oncology    Number of Visits Requested:  1   The patient has a good understanding of the overall plan. she agrees with it. she will call with any problems  that may develop before the next visit here.   Rulon Eisenmenger, MD 07/04/2015

## 2015-07-04 NOTE — Telephone Encounter (Signed)
Scheduled 6 month follow up appt per 3/7 pof. Spoke with patient sister to inform of date/time

## 2015-07-05 ENCOUNTER — Ambulatory Visit: Payer: Medicaid Other | Admitting: Hematology and Oncology

## 2015-07-09 IMAGING — MR MR ABDOMEN WO/W CM
9 of 17 series · 25 of 48 positions shown · IV contrast (5ml eovist)
Comparison: Multiple exams, including 09/20/2014

CLINICAL DATA: Hepatic cirrhosis. Echogenic liver lesions,
indeterminate on ultrasound.

EXAM:
MRI ABDOMEN WITHOUT AND WITH CONTRAST
TECHNIQUE: Multiplanar multisequence MR imaging of the abdomen was performed
both before and after the administration of intravenous contrast.
CONTRAST:  5 cc Eovist

[Series 3: T2 · coronal · 5.0mm · 1.41mm/px · 1 of 27 slices shown]
[im 1/27]
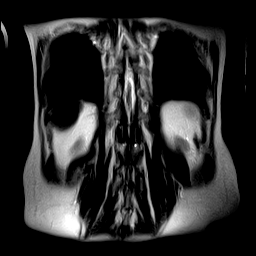

[Series 4: axial in out · axial · 6.0mm · 0.68mm/px · 1 of 60 slices shown]
[im 1/60]
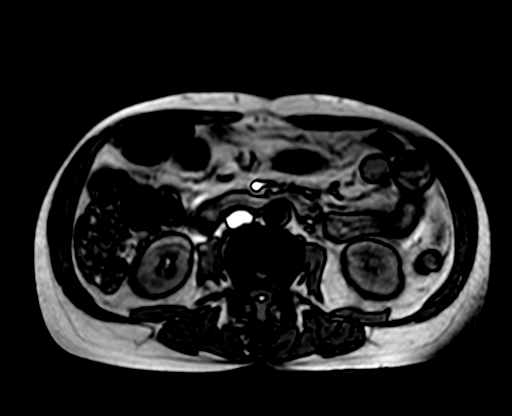

[Series 5: T1 dynamic · axial · non-contrast · 2.2mm · 0.72mm/px · z∈[-128,+63]mm · 3 of 88 slices shown]
[im 1/88]
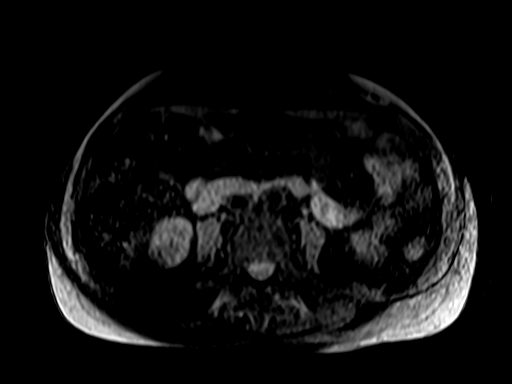
[im 44/88]
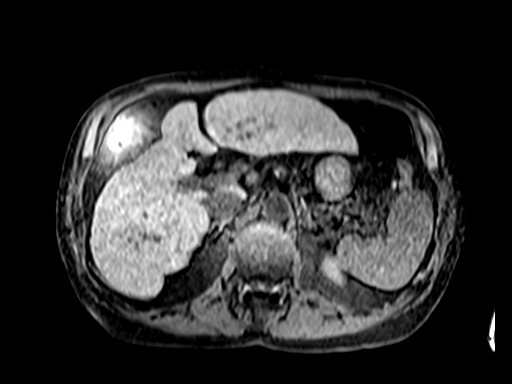
[im 88/88]
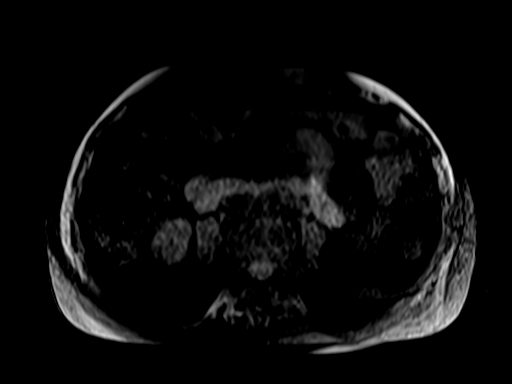

[Series 6: post immed · axial · 2.2mm · 0.72mm/px · z∈[-128,+63]mm · 3 of 88 slices shown]
[im 1/88]
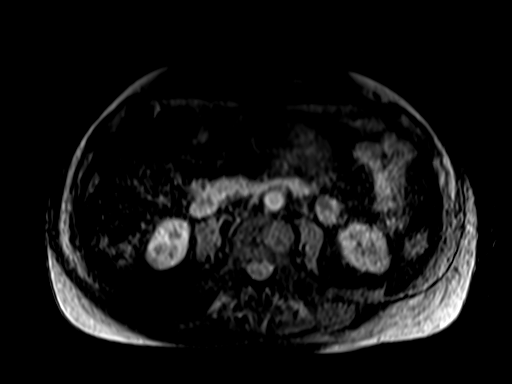
[im 44/88]
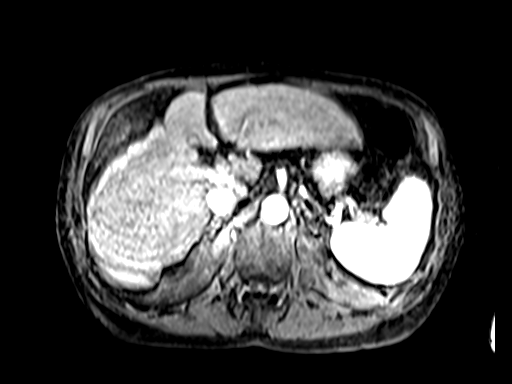
[im 88/88]
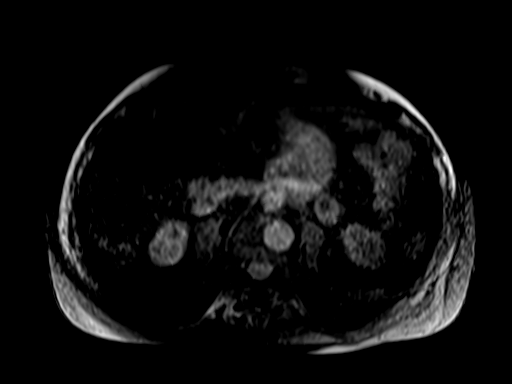

[Series 7: post immed_sub · axial · 2.2mm · 0.72mm/px · z∈[-128,+63]mm · 4 of 88 slices shown]
[im 1/88]
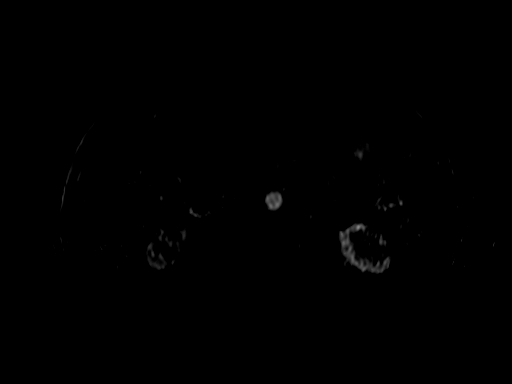
[im 30/88]
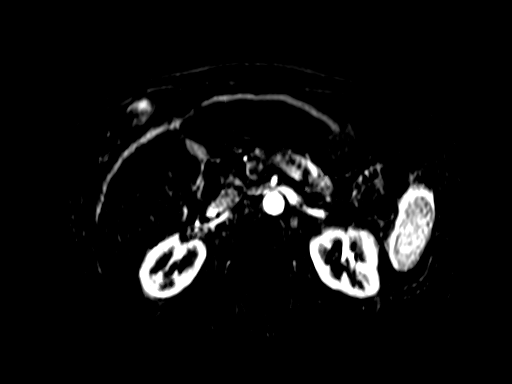
[im 59/88]
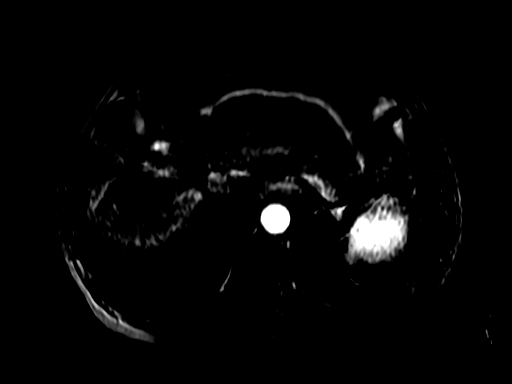
[im 88/88]
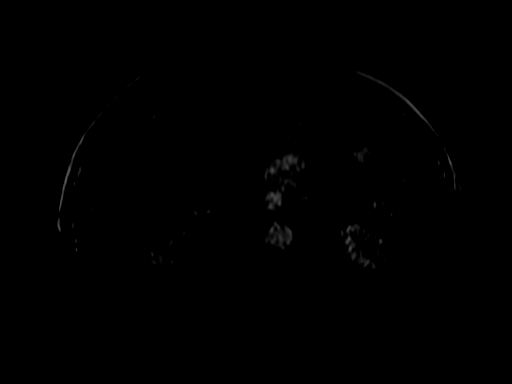

[Series 8: post 45 sec · axial · 2.2mm · 0.72mm/px · z∈[-128,+63]mm · 4 of 88 slices shown]
[im 1/88]
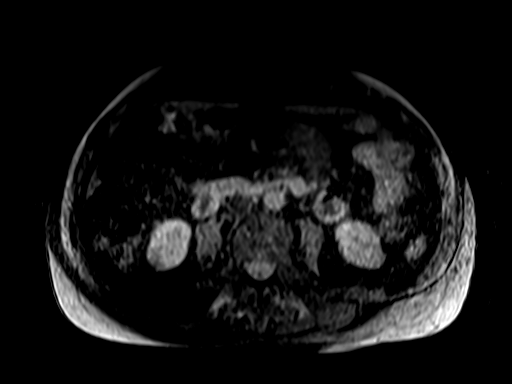
[im 30/88]
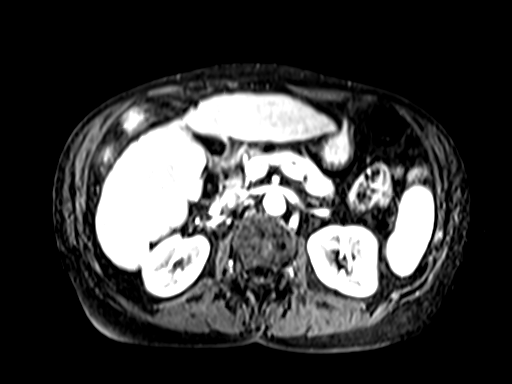
[im 59/88]
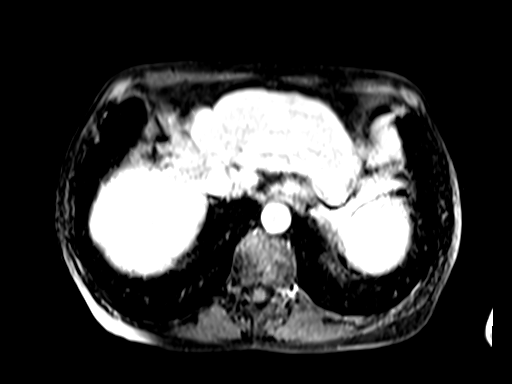
[im 88/88]
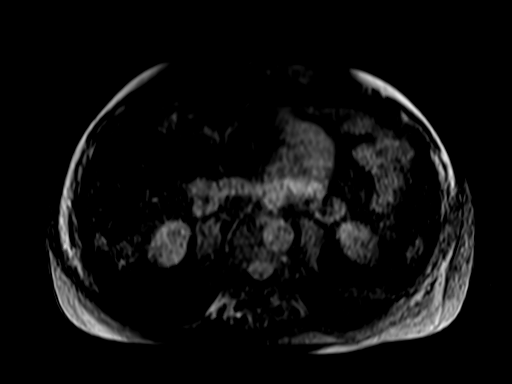

[Series 9: post 45 sec_sub · axial · 2.2mm · 0.72mm/px · z∈[-128,+63]mm · 4 of 88 slices shown]
[im 1/88]
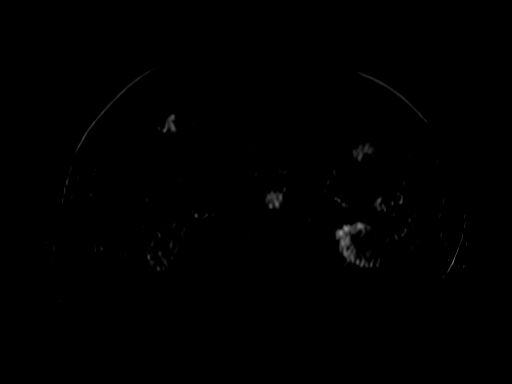
[im 30/88]
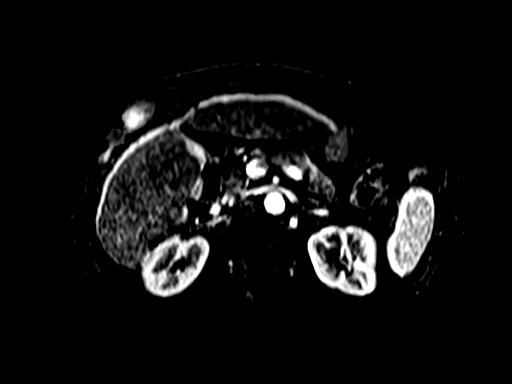
[im 59/88]
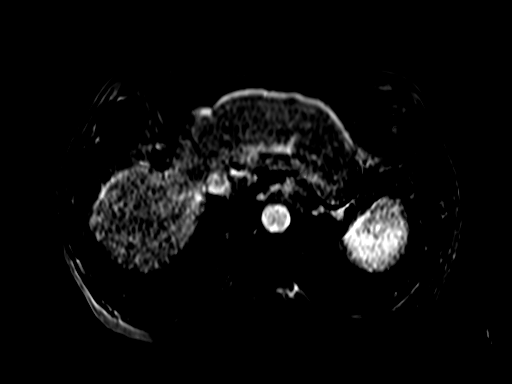
[im 88/88]
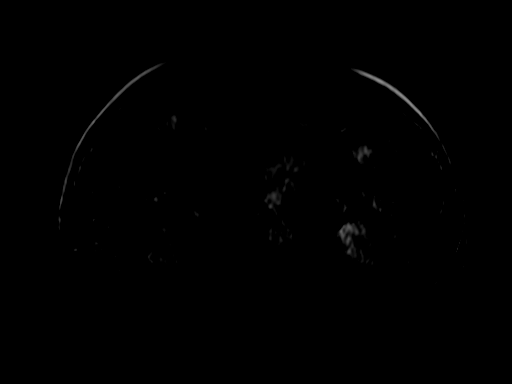

[Series 10: post 90 sec · axial · 2.2mm · 0.72mm/px · z∈[-128,+63]mm · 4 of 88 slices shown]
[im 1/88]
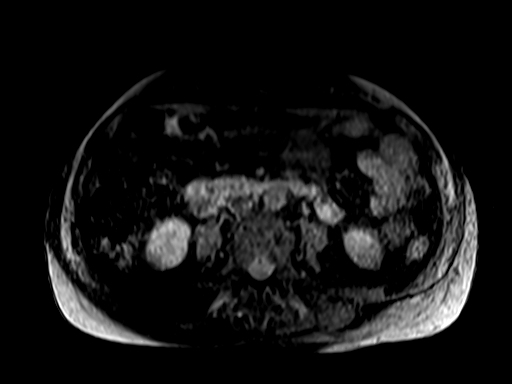
[im 30/88]
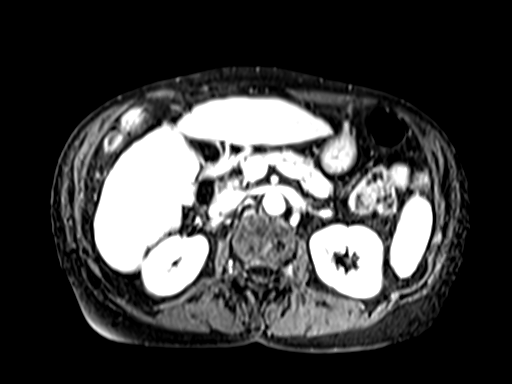
[im 59/88]
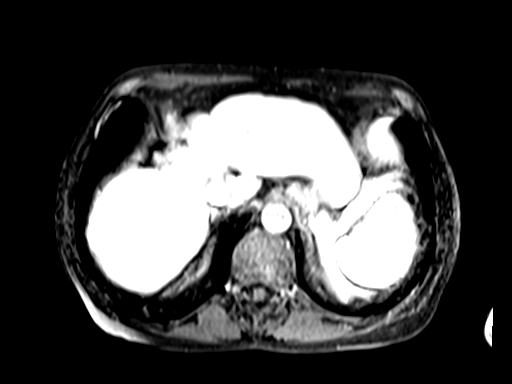
[im 88/88]
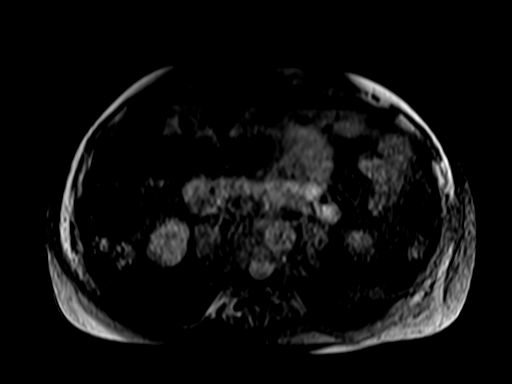

[Series 11: post 90 sec_sub · axial · 2.2mm · 0.72mm/px · 1 of 88 slices shown]
[im 1/88]
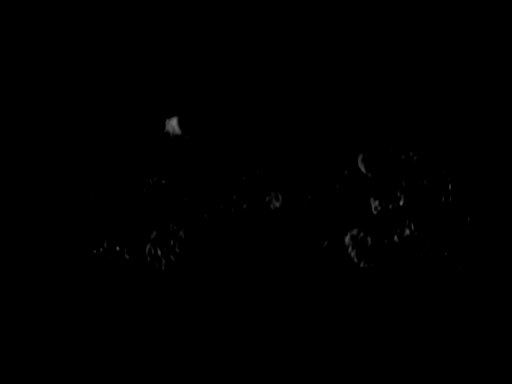

[25 of 48 positions shown; findings below may reference images not displayed]

FINDINGS: Lower chest:  Unremarkable

Hepatobiliary: Nodular contour with enlargement of the lateral
segment left hepatic lobe compatible with cirrhosis. 0.9 by 0.7 cm
focus of arterial phase enhancement in segment 5, image 57 series 6,
isointense on portal venous phase images, mildly hypointense on
delayed images, not well seen on diffusion or T2 weighted images.

No other significant focal enhancement is identified.

Pancreas: Unremarkable

Spleen: Faint hypodensities inferiorly in the spleen, indistinctly
marginated and nonspecific, most visible on portal venous phase
images were there is a 1.4 by 0.9 cm lesion on image 48 series 10.

Adrenals/Urinary Tract: Unremarkable

Stomach/Bowel: Unremarkable

Vascular/Lymphatic: Unremarkable

Other: No supplemental non-categorized findings.

Musculoskeletal: Unremarkable
IMPRESSION: 1. Average size 0.8 cm focus in the lateral portion of segment 5 of
the liver adjacent to the gallbladder, with arterial phase
enhancement and washout. This is hypointense on the hepatocellular
phase images. Although a small hepatocellular carcinoma could have
this appearance, so could benign etiologies such as a hemangioma or
dysplastic nodule. This is assigned ABUNDO_BEIBARYS category 3 (intermediate
probability for hepatocellular carcinoma). Given the small size,
biopsy may be problematic, and follow up surveillance imaging by MRI
in 3-6 months time is likely indicated.
2. Cirrhosis.
3. Several hypodensities in the spleen are technically nonspecific.
These were not readily apparent at sonography. Faint suggestion of
similar findings on 07/26/2005 suggests a benign etiology.

## 2015-07-10 ENCOUNTER — Encounter: Payer: Self-pay | Admitting: Radiation Oncology

## 2015-07-10 NOTE — Progress Notes (Signed)
Thoracic Location of Tumor / Histology :Right Upper Lobe Lung Nodule   Patient presented  months ago with symptoms of: SOB  Biopsies of  (if applicable) revealed:  CT/ Pet Scan  Right Upper Lobe lung nodule 15 mm ( consistent with Bronchogenic carcinoma, Morphologically suggestive of squamous cell  carcinoma  Tobacco/Marijuana/Snuff/ETOH use:  40 years 1ppd  tobacco smoking cigarettes , HX Cocaine, alcohol abuse(HX acute alcoholic hepatitis) drinks 8 cans beer per week  Past/Anticipated interventions by cardiothoracic surgery, if any: Dr. Legrand Como Wert,MD,Pulomologist , not a candidate for surgery has f/u appt end of March 2017, , Left heart catheterization with coronary angiogram ,Dr. Burnell Blanks, 06/06/11  Past/Anticipated interventions by medical oncology, if any: Dr. Lindi Adie 07/04/15=not a candidate for chemotherapy  Signs/Symptoms  Weight changes, if any:No  Respiratory complaints, if any: Yes, Chronic  Sob /C Severe COPD, still smokes 1ppd cigarettes,   Hemoptysis, if any:NO  Pain issues, if any:  NO  SAFETY ISSUES:YES,   Prior radiation? NO  Pacemaker/ICD? NO  Possible current pregnancy? NO  Is the patient on methotrexate?  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

## 2015-07-12 ENCOUNTER — Ambulatory Visit
Admission: RE | Admit: 2015-07-12 | Discharge: 2015-07-12 | Disposition: A | Discharge status: Home / Self Care | Payer: Medicaid Other | Source: Ambulatory Visit | Attending: Radiation Oncology | Admitting: Radiation Oncology

## 2015-07-12 ENCOUNTER — Encounter: Payer: Self-pay | Admitting: Radiation Oncology

## 2015-07-12 VITALS — BP 128/78 | HR 94 | Temp 97.8°F | Resp 20 | Ht 68.0 in | Wt 107.8 lb

## 2015-07-12 DIAGNOSIS — Z8659 Personal history of other mental and behavioral disorders: Secondary | ICD-10-CM | POA: Insufficient documentation

## 2015-07-12 DIAGNOSIS — Z87898 Personal history of other specified conditions: Secondary | ICD-10-CM | POA: Diagnosis not present

## 2015-07-12 DIAGNOSIS — C3411 Malignant neoplasm of upper lobe, right bronchus or lung: Secondary | ICD-10-CM

## 2015-07-12 DIAGNOSIS — J449 Chronic obstructive pulmonary disease, unspecified: Secondary | ICD-10-CM | POA: Diagnosis not present

## 2015-07-12 DIAGNOSIS — I10 Essential (primary) hypertension: Secondary | ICD-10-CM | POA: Insufficient documentation

## 2015-07-12 DIAGNOSIS — G40909 Epilepsy, unspecified, not intractable, without status epilepticus: Secondary | ICD-10-CM | POA: Diagnosis not present

## 2015-07-12 DIAGNOSIS — F17210 Nicotine dependence, cigarettes, uncomplicated: Secondary | ICD-10-CM | POA: Insufficient documentation

## 2015-07-12 HISTORY — DX: Malignant neoplasm of upper lobe, right bronchus or lung: C34.11

## 2015-07-12 NOTE — Progress Notes (Signed)
Radiation Oncology         [phone]) [phone] ________________________________  Name: Wanda Prince MRN: [id number]  Date: 07/12/2015  DOB: Aug 12, 1955  XN:[id number], Wanda Mercury, MD  Katherina Mires, MD     REFERRING PHYSICIAN: Katherina Mires, MD   DIAGNOSIS: The encounter diagnosis was Primary cancer of right upper lobe of lung ([place]).   HISTORY OF PRESENT ILLNESS: Wanda Prince is a 60 y.o. female seen at the request of Dr. Lindi Adie for a probable right upper lobe lung cancer. Apparently the patient was admitted 05/04/15 through 05/07/15 for a COPD exacerbation. During her workup, a 15 mm spiculated RUL nodule and a 5 mm LLL nodule were noted concerning for a primary lung cancer. She was seen by her pulmonologist Dr. Melvyn Novas, who did not feel that she was a candidate for attempting a biopsy due to her multiple medical comorbidities and worsening lung function. She was seen by Dr. Lindi Adie on 05/18/15 and was advised to proceed with PET scan. She also was seen in the ED on 06/07/15 with abdominal pain and a CT of the abdomen and pelvis was performed revealing thickening of the sigmoid colon consistent with colitis, cirrhotic liver changes, and focal nodular consolidation within the LLL.  Her PET scan was performed on 06/22/15 revealing hypermetabolic changes of her RUL lesion with an SUV of 7.2 and nodular thinkening in the LLL measuring 10 mm with an SUV of 1.8. It seemed slightly decreased in volume since her prior CT. No mediastinal adenopathy or hypermetabolic liver changes were noted. She met back with Dr. Lindi Adie, and is not a candidate for chemotherapy, and rather comes for consideration of radiotherapy options to treat her presumed early stage lung cancer.    PREVIOUS RADIATION THERAPY: No   PAST MEDICAL HISTORY:  Past Medical History  Diagnosis Date  . Meningitis 2009  . Hx of ventricular shunt 2009  . Seizure disorder ([place])   . 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 Suicide attempt [facility])     History of suicide attempts August 2006 and July 2006  . History of alcohol abuse   . History of cocaine abuse   . Major depressive disorder ([place])     History of  . History of iron deficiency   . COPD (chronic obstructive pulmonary disease) ([place])   . History of acute alcoholic hepatitis   . Psychiatric diagnosis     Multiple  . 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 Left breast mass   . Hypertension        PAST SURGICAL HISTORY: Past Surgical History  Procedure Laterality Date  . Craniotomy    . Breast mass removal    . Brain surgery    . Breast surgery    . Left heart catheterization with coronary angiogram N/A 06/06/2011    Procedure: LEFT HEART CATHETERIZATION WITH CORONARY ANGIOGRAM;  Surgeon: Burnell Blanks, MD;  Location: [facility] CATH LAB;  Service: Cardiovascular;  Laterality: N/A;     FAMILY HISTORY:  Family History  Problem Relation Age of Onset  . Coronary artery disease Father      SOCIAL HISTORY:  reports that she has been smoking Cigarettes.  She has a 40 pack-year smoking history. She does not have any smokeless tobacco history on file. She reports that she drinks  about 4.8 oz of alcohol per week. She reports that she uses illicit drugs (Cocaine). Patient lives alone in an apartment. She is divorced. She finds accompany of her pet. Her sister is her for transportation and is a very active participant in her care.   ALLERGIES: Review of patient's allergies indicates no known allergies.   MEDICATIONS:  Current Outpatient Prescriptions  Medication Sig Dispense Refill  . acetaminophen (TYLENOL) 500 MG tablet Take 1,000 mg by mouth every 6 (six) hours as needed for pain.    Marland Kitchen albuterol (PROVENTIL HFA;VENTOLIN HFA) 108 (90 BASE) MCG/ACT inhaler Inhale 2 puffs into the lungs every 4 (four) hours as needed for  wheezing or shortness of breath.     Marland Kitchen albuterol (PROVENTIL) (2.5 MG/3ML) 0.083% nebulizer solution Take 3 mLs (2.5 mg total) by nebulization every 2 (two) hours as needed for wheezing or shortness of breath. 75 mL 12  . aspirin 81 MG chewable tablet Chew 81 mg by mouth daily.    Marland Kitchen b complex vitamins tablet Take 1 tablet by mouth 2 (two) times daily. 62 tablet 0  . cholecalciferol (VITAMIN D) 1000 UNITS tablet Take 1,000 Units by mouth daily.    . cyclobenzaprine (FLEXERIL) 5 MG tablet Take 5 mg by mouth every 8 (eight) hours as needed for muscle spasms.     . folic acid (FOLVITE) 1 MG tablet Take 1 mg by mouth daily.    Marland Kitchen gabapentin (NEURONTIN) 300 MG capsule Take 300 mg by mouth at bedtime.     . levETIRAcetam (KEPPRA) 500 MG tablet Take 1 tablet (500 mg total) by mouth 2 (two) times daily. 62 tablet 0  . metoprolol succinate (TOPROL XL) 25 MG 24 hr tablet Take 25 mg by mouth daily.     . Multiple Vitamin (MULITIVITAMIN WITH MINERALS) TABS Take 1 tablet by mouth daily. 30 tablet 6  . thiamine 100 MG tablet Take 1 tablet (100 mg total) by mouth daily. 30 tablet 6  . Umeclidinium-Vilanterol (ANORO ELLIPTA) 62.5-25 MCG/INH AEPB 1 puff once daily 60 each 6  . famotidine (PEPCID) 20 MG tablet One at bedtime (Patient not taking: Reported on 06/07/2015) 30 tablet 2  . nicotine (NICODERM CQ - DOSED IN MG/24 HOURS) 14 mg/24hr patch Place 1 patch (14 mg total) onto the skin daily. (Patient not taking: Reported on 07/12/2015) 28 patch 0  . ondansetron (ZOFRAN ODT) 4 MG disintegrating tablet Take 1 tablet (4 mg total) by mouth every 8 (eight) hours as needed for nausea. (Patient not taking: Reported on 07/12/2015) 10 tablet 0  . spironolactone (ALDACTONE) 25 MG tablet Take 25 mg by mouth daily.      No current facility-administered medications for this encounter.     REVIEW OF SYSTEMS: On review of systems, the patient states That she is doing well overall. She denies any chest pain or shortness of breath.  She is not experiencing any hematemesis but states that she does have on occasion a productive cough. She denies any need for oxygen use. She is not experiencing any abdominal pain, nausea, vomiting, bowel or bladder dysfunction. She denies any unintended weight changes. She denies any hot flashes or night sweats. She is not experiencing fevers. A complete review of systems is obtained and is otherwise negative.  PHYSICAL EXAM:  height is _0  (1.727 m) and weight is 107 lb 12.8 oz (48.898 kg). Her oral temperature is 97.8 F (36.6 C). Her blood pressure is 128/78 and her pulse is 94. Her respiration is  20 and oxygen saturation is 96%.   Pain scale 0/10 In general this is a well appearing Caucasian female in no acute distress. She is alert and oriented x4 and appropriate throughout the examination. HEENT reveals that the patient is normocephalic, atraumatic. EOMs are intact. PERRLA. Skin is intact without any evidence of gross lesions. Cardiovascular exam reveals a regular rate and rhythm, no clicks rubs or murmurs are auscultated. Chest is clear to auscultation bilaterally. Lymphatic assessment is performed and does not reveal any adenopathy in the cervical, supraclavicular, axillary, or inguinal chains. Abdomen has active bowel sounds in all quadrants and is intact. The abdomen is soft, non tender, non distended. Lower extremities are negative for pretibial pitting edema, deep calf tenderness, cyanosis or clubbing.  ECOG = 2  0 - Asymptomatic (Fully active, able to carry on all predisease activities without restriction)  1 - Symptomatic but completely ambulatory (Restricted in physically strenuous activity but ambulatory and able to carry out work of a light or sedentary nature. For example, light housework, office work)  2 - Symptomatic, <50% in bed during the day (Ambulatory and capable of all self care but unable to carry out any work activities. Up and about more than 50% of waking hours)  3 -  Symptomatic, >50% in bed, but not bedbound (Capable of only limited self-care, confined to bed or chair 50% or more of waking hours)  4 - Bedbound (Completely disabled. Cannot carry on any self-care. Totally confined to bed or chair)  5 - Death   Eustace Pen MM, Creech RH, Tormey DC, et al. [phone]). "Toxicity and response criteria of the [facility]". [place] Oncol. 5 (6): 649-55    LABORATORY DATA:  Lab Results  Component Value Date   WBC 13.8* 06/07/2015   HGB 15.4* 06/07/2015   HCT 45.6 06/07/2015   MCV 97.4 06/07/2015   PLT 240 06/07/2015   Lab Results  Component Value Date   NA 137 06/07/2015   K 3.8 06/07/2015   CL 99* 06/07/2015   CO2 25 06/07/2015   Lab Results  Component Value Date   ALT 38 06/07/2015   AST 53* 06/07/2015   ALKPHOS 136* 06/07/2015   BILITOT 1.4* 06/07/2015      RADIOGRAPHY: Nm Pet Image Restag (ps) Skull Base To Thigh  06/22/2015  CLINICAL DATA:  Initial treatment strategy for lung nodule. EXAM: NUCLEAR MEDICINE PET SKULL BASE TO THIGH TECHNIQUE: 5.5 mCi F-18 FDG was injected intravenously. Full-ring PET imaging was performed from the skull base to thigh after the radiotracer. CT data was obtained and used for attenuation correction and anatomic localization. FASTING BLOOD GLUCOSE:  Value: 84 mg/dl COMPARISON:  None. FINDINGS: NECK No hypermetabolic lymph nodes in the neck. CHEST Spiculated nodule with central lucency in the RIGHT upper lobe measures 13 mm on image 60, series 4 with SUV max equals 7.2. There is centrilobular emphysema. There is a nodular thickening in the inferior aspect of the LEFT lower lobe measuring 10 mm on image 105, series 4. There is mild metabolic activity associated with this lower lobe nodule with SUV max equal 1.8 (lesion misregistration). The nodule appears slightly decreased in volume from recent CT measuring 14 mm at that time which which suggests inflammatory infectious process. No hypermetabolic  mediastinal lymph nodes. ABDOMEN/PELVIS No abnormal hypermetabolic activity within the liver, pancreas, adrenal glands, or spleen. No hypermetabolic lymph nodes in the abdomen or pelvis. Extensive diverticulosis of the sigmoid colon with metabolic activity which is likely  inflammatory. A peritoneal catheter without complication. SKELETON No focal hypermetabolic activity to suggest skeletal metastasis. IMPRESSION: 1. 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 2. Mildly hypermetabolic nodule of the LEFT lung base which is decreased slightly in size in short interval favors infectious or inflammatory process. 3. No mediastinal lymphadenopathy metastatic mild hypermetabolic mediastinal adenopathy. 4. Sigmoid diverticulosis. Electronically Signed   By: Suzy Bouchard M.D.   On: 06/22/2015 11:58   Dg Foot Complete Right  06/13/2015  CLINICAL DATA:  Hit medial aspect of foot against solid object EXAM: RIGHT FOOT COMPLETE - 3+ VIEW COMPARISON:  None. FINDINGS: Frontal, oblique, and lateral views were obtained. There is a nondisplaced transversely oriented fracture of the proximal aspect of the first proximal phalanx. Alignment is anatomic. No other fracture. No dislocation. There is mild narrowing of all PIP and DIP joints as well as the first MTP joint. No erosive change peer IMPRESSION: Nondisplaced fracture, proximal aspect first proximal phalanx. No other fracture. No dislocation. Narrowing of multiple distal joints. No erosive change. Electronically Signed   By: Lowella Grip III M.D.   On: 06/13/2015 12:55    IMPRESSION: Patient has a clinically apparent stage I lung cancer of the right upper lobe.   PLAN:  Dr. Tammi Klippel discusses the imaging findings and what appears to be an early stage lung cancer. Patient is not a good candidate for biopsy or chemotherapy given her comorbidities. Without biopsy is difficult  to note a histologic type however it has been suggested that the features on imaging study may represent a squamous cell carcinoma. He discusses with the patient the role of  SBRT which we feel would provide about 90% chance of local control. We discussed the use of 4-dimensional simulation to minimize normal lung tissue treated. Dr. Lisbeth Renshaw discusses the role for 54 grays in 3 fractions. Dr. Lisbeth Renshaw alsodiscussed that SBRT was unlikely to make her breathing any worse. It is also unlikely to make her breathing symptoms any better. We discussed possible side effects including shoulder pain due to arm positioning and possible rib fracture due to the pleural-based nodule proximity to the ribs. We discussed with her that without treatement this could develop into a more aggressive or even metastatic cancer if this goes untreated. She understands these risks. Written consent is obtained, and the patient's CT simulation has been scheduled on 07/24/15 at [place].   The above documentation reflects my direct findings during this shared patient visit. Please see the separate note by Dr. Lisbeth Renshaw on this date for the remainder of the patient's plan of care.    Carola Rhine, PAC  This document serves as a record of services personally performed by Carola Rhine, PAC and Kyung Rudd, MD. It was created on their behalf by Darcus Austin, a trained medical scribe. The creation of this record is based on the scribe's personal observations and the provider's statements to them. This document has been checked and approved by the attending provider.

## 2015-07-12 NOTE — Progress Notes (Signed)
Please see the Nurse Progress Note in the MD Initial Consult Encounter for this patient. 

## 2015-07-24 ENCOUNTER — Ambulatory Visit (HOSPITAL_COMMUNITY)
Admission: RE | Admit: 2015-07-24 | Discharge: 2015-07-24 | Disposition: A | Discharge status: Home / Self Care | Payer: Medicaid Other | Source: Ambulatory Visit | Attending: Internal Medicine | Admitting: Internal Medicine

## 2015-07-24 ENCOUNTER — Encounter: Payer: Self-pay | Admitting: Internal Medicine

## 2015-07-24 ENCOUNTER — Ambulatory Visit (INDEPENDENT_AMBULATORY_CARE_PROVIDER_SITE_OTHER): Payer: Medicaid Other | Admitting: Internal Medicine

## 2015-07-24 ENCOUNTER — Ambulatory Visit: Payer: Medicaid Other | Admitting: Radiation Oncology

## 2015-07-24 ENCOUNTER — Other Ambulatory Visit: Payer: Medicaid Other

## 2015-07-24 VITALS — BP 104/70 | HR 90 | Ht 68.0 in | Wt 109.0 lb

## 2015-07-24 DIAGNOSIS — J984 Other disorders of lung: Secondary | ICD-10-CM

## 2015-07-24 DIAGNOSIS — J449 Chronic obstructive pulmonary disease, unspecified: Secondary | ICD-10-CM

## 2015-07-24 LAB — PULMONARY FUNCTION TEST
DL/VA % pred: 23 %
DL/VA: 1.23 ml/min/mmHg/L
DLCO unc % pred: 12 %
DLCO unc: 3.66 ml/min/mmHg
FEF 25-75 Post: 0.22 L/sec
FEF 25-75 Pre: 0.2 L/sec
FEF2575-%Change-Post: 8 %
FEF2575-%Pred-Post: 7 %
FEF2575-%Pred-Pre: 7 %
FEV1-%Change-Post: -2 %
FEV1-%Pred-Post: 21 %
FEV1-%Pred-Pre: 21 %
FEV1-Post: 0.65 L
FEV1-Pre: 0.66 L
FEV1FVC-%Change-Post: -3 %
FEV1FVC-%Pred-Pre: 38 %
FEV6-%Change-Post: 3 %
FEV6-%Pred-Post: 44 %
FEV6-%Pred-Pre: 42 %
FEV6-Post: 1.67 L
FEV6-Pre: 1.62 L
FEV6FVC-%Change-Post: 1 %
FEV6FVC-%Pred-Post: 77 %
FEV6FVC-%Pred-Pre: 75 %
FVC-%Change-Post: 1 %
FVC-%Pred-Post: 57 %
FVC-%Pred-Pre: 56 %
FVC-Post: 2.22 L
FVC-Pre: 2.19 L
Post FEV1/FVC ratio: 29 %
Post FEV6/FVC ratio: 75 %
Pre FEV1/FVC ratio: 30 %
Pre FEV6/FVC Ratio: 74 %
RV % pred: 217 %
RV: 4.63 L
TLC % pred: 127 %
TLC: 7.22 L

## 2015-07-24 MED ORDER — ALBUTEROL SULFATE (2.5 MG/3ML) 0.083% IN NEBU
2.5000 mg | INHALATION_SOLUTION | Freq: Once | RESPIRATORY_TRACT | Status: AC
  Administered 2015-07-24: 2.5 mg via RESPIRATORY_TRACT

## 2015-07-24 NOTE — Assessment & Plan Note (Addendum)
05/18/2015  extensive coaching HFA effectiveness = 0 - trial of Anoro  05/18/15 > better doe  06/01/2015  - Spirometry 06/01/2015  FEV1 0.54 (19%)  Ratio 39   - PFT's  07/24/2015  FEV1 0.65 (21 % ) ratio 29  p no % improvement from saba p anoro am prior to study with DLCO  12 % corrects to 23 % for alv volume   She has very severe dz approaching endstage with risk of worsening with or without RT to RUL (see lung nodule)  For now nothing else to offer here   Each maintenance medication was reviewed in detail including most importantly the difference between maintenance and as needed and under what circumstances the prns are to be used.  Please see instructions for details which were reviewed in writing and the patient given a copy.

## 2015-07-24 NOTE — Patient Instructions (Addendum)
Please remember to go to the lab department downstairs for your tests - we will call you with the results when they are available  You should discuss the expected effect of the radiation on your lung function with Dr Lisbeth Renshaw before starting radiation with an option of waiting 3 months to prove this area is growing before proceeding with radiation if you decide against it now

## 2015-07-24 NOTE — Progress Notes (Signed)
Subjective:  Patient ID: Wanda Prince, female   DOB: 1955-10-24    MRN: [id number]    Brief patient profile:  29 yowf quit smoking 04/21/15 with deteriorating sob x weeks > admit   Date of Admission: 04/22/2015   Date of Discharge: 05/07/2015 Attending Physician: Aldine Contes, MD  Discharge Diagnosis: Active Problems:  COPD with acute exacerbation ([place])  COPD exacerbation ([place])  Acute respiratory failure with hypoxemia ([place])  Encounter for imaging study to confirm orogastric (OG) tube placement  Encounter for nasogastric (NG) tube placement  Acute respiratory failure ([place])  Lung mass  Liver mass   05/18/2015 1st [facility] office visit/ Wanda Prince  maint rx spiriva/advair but very poor technique  Chief Complaint  Patient presents with  . Pulmonary Consult    Referred by Dr. Suzanna Obey. Pt states that she was dxed with COPD years ago. She c/o increased SOB for the past month. She is SOB just sitting and doing nothing and unable to exert herself much due to SOB.   baseline = used [employer] parking / sometimes used scooter sometimes not  / now sob at rest  Has flutter not using/ very congested cough but no purulent mucus  Sleeps on 2 pillows ok Very poor insight into meds/ flutter/ purse lip  rec Pantoprazole (protonix) 40 mg   Take  30-60 min before first meal of the day and Pepcid (famotidine)  20 mg one @  bedtime until return to office - this is the best way to tell whether stomach acid is contributing to your problem.   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 Follow-up    Breathing has improved some. She is using albuterol inhaler 2 x daily and neb 1 x daily on average.     now able to walk at HT leaning on cart/ no noct symptoms/ still not smoking  rec Plan A =  [employer] = Continue Anoro one click each am  Plan B=   Backup Only use your albuterol as a rescue medication  Only use the nebulizer if you try the inhaler first and it doesn't work, ok to use up to every 4 hours if you must    07/24/2015  f/u ov/Wanda Prince re: copd GOLD IV  maint rx anoro  Chief Complaint  Patient presents with  . Follow-up    [facility]. Breathing is unchanged. Still using albuterol inhaler 2 x and neb 1 x daily on average.   doe x across the room = MMRC4  = sob if tries to leave home or while getting dressed      No obvious day to day or daytime variability or assoc excess/ purulent sputum or mucus plugs  cp or chest tightness, subjective wheeze or overt sinus or hb symptoms. No unusual exp hx or h/o childhood pna/ asthma or knowledge of premature birth.  Sleeping ok without nocturnal  or early am exacerbation  of respiratory  c/o's or need for noct saba. Also denies any obvious fluctuation of symptoms with weather or environmental changes or other aggravating or alleviating factors except as outlined above  Current Medications, Allergies, Complete Past Medical History, Past Surgical History, Family History, and Social History were reviewed in [employer] record.  ROS  The following are not active complaints unless bolded sore throat, dysphagia, dental problems, itching, sneezing,  nasal congestion or excess/ purulent secretions, ear ache,   fever, chills, sweats, unintended wt loss, classically pleuritic or exertional cp, hemoptysis,  orthopnea pnd or leg swelling, presyncope, palpitations, abdominal pain, anorexia, nausea, vomiting, diarrhea  or change in bowel or bladder habits, change in stools or urine, dysuria,hematuria,  rash, arthralgias, visual complaints, headache, numbness, weakness or ataxia or problems with walking or coordination,  change in  mood/affect or memory.             Objective:   Physical Exam  Thin amb wf walks with cane but came in w/c   07/24/2015        109   06/01/2015         112   05/07/15 105 lb 4.8 oz (47.764 kg)  01/12/14 115 lb 15.4 oz (52.6 kg)  09/29/12 135 lb (61.236 kg)    Vital signs reviewed   HEENT: nl dentition, turbinates, and oropharynx. Nl external ear canals without cough reflex   NECK :  without JVD/Nodes/TM/ nl carotid upstrokes bilaterally   LUNGS: no acc muscle use,  slt barrel contour chest  Distant bs marked increased Texp s true wheeze   CV:  RRR  no s3 or murmur or increase in P2, no edema   ABD:  soft and nontender with  Mid to late Hoover's  the supine position. No bruits or organomegaly, bowel sounds nl  MS:  Nl gait/ ext warm without deformities, calf tenderness, cyanosis or clubbing No obvious joint restrictions   SKIN: warm and dry without lesions    NEURO:  alert, approp, nl sensorium with  no motor deficits    I personally reviewed images and agree with radiology impression as follows:  PET   06/22/15 1. 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 2. Mildly hypermetabolic nodule of the LEFT lung base which is decreased slightly in size in short interval favors infectious or inflammatory process. 3. No mediastinal lymphadenopathy metastatic mild hypermetabolic mediastinal adenopathy. 4. Sigmoid diverticulosis.      Assessment:

## 2015-07-24 NOTE — Assessment & Plan Note (Signed)
See ct 05/05/15  - PET 06/22/2015 1. 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 2. Mildly hypermetabolic nodule of the LEFT lung base which is decreased slightly in size in short interval favors infectious or inflammatory process. 3. No mediastinal lymphadenopathy metastatic mild hypermetabolic mediastinal adenopathy. - Quant Gold 07/24/2015 >>>   I had an extended discussion with the patient reviewing all relevant studies completed to date and  lasting 15 to 20 minutes of a 25 minute visit on the following ongoing concerns:   1) this lesion can't be seen on plain cxr so no telling how long it's been present  2) if Quant Gold neg TB options are watchful waiting vs go ahead with RT given probably 90% or greater chance this is bronchogenic ca and not a good candidate for bx given severity of copd (navigational bx requires gen anesthesia)   3) will defer to RT discussion of effects of RT on residual lung function> probably ok as this is Upper lobe lesion but def risk she'll get worse before better.

## 2015-07-26 LAB — QUANTIFERON TB GOLD ASSAY (BLOOD)
Interferon Gamma Release Assay: POSITIVE — AB
Mitogen-Nil: >10 IU/mL
Quantiferon Nil Value: 0.16 IU/mL
Quantiferon Tb Ag Minus Nil Value: 0.42 IU/mL

## 2015-07-27 ENCOUNTER — Telehealth: Payer: Self-pay | Admitting: *Deleted

## 2015-07-27 ENCOUNTER — Telehealth: Payer: Self-pay | Admitting: Internal Medicine

## 2015-07-27 NOTE — Telephone Encounter (Signed)
No RT and I sent a message to Dr Lisbeth Renshaw re this issue

## 2015-07-27 NOTE — Telephone Encounter (Signed)
Called spoke with patient. She reports she has a memory problem and her sister Vickii Chafe is the one who takes care of her. Pt does not remember anything MW has informed her of the TB and requesting MW call her sister Vickii Chafe to discuss her results.  Vickii Chafe Cell # [phone]  Please advise MW thanks

## 2015-07-27 NOTE — Telephone Encounter (Signed)
Pt is returning call from nurse and can be reached @ [phone].Wanda Prince

## 2015-07-27 NOTE — Telephone Encounter (Signed)
LM with Vickii Chafe (sister) - Cell # [phone]

## 2015-07-27 NOTE — Telephone Encounter (Addendum)
Called Tammy at the [facility] [phone]) to give her pt info as pt had pos Quantiferon Gold test  I had to leave her a detailed msg  I will await her call back to ensure msg received  Lab result and ov note already faxed to her at [phone]    Tammy called back and confirmed records were received

## 2015-07-27 NOTE — Telephone Encounter (Signed)
Pt calling back wanting to speak to nurse about if she is to still have any radiation treatments.Wanda Prince

## 2015-07-27 NOTE — Telephone Encounter (Signed)
Spoke with pt. 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 Please advise MW thanks

## 2015-07-27 NOTE — Telephone Encounter (Signed)
Returned call to sister Annabell Howells ,caregiver of the patient, she stated she"  Called the health dept that someone called her sister Wanda Prince and gave her information that she had TB not cancer, t and was told not to send patient out of the house that she has TB and until they come and see the patient  She isn't going to bring her anywhere" cancelling tomorrow's  Appt.,listened to the sister who was upset over someone calling the patient  And scaring her, offered apologies for the situation that occurred for the patient and sister, wasn't called by Dr. Lisbeth Renshaw office, , but everything has been handled  She stated and, thanked me for the return call , inormed [employer], and will in basket MD, called Ct sim and spoke with Amy CT Therapist,cancelling tomorrow's sbrt ct simulation 3:15 PM

## 2015-07-27 NOTE — Progress Notes (Signed)
Quick Note:  I have LMOM for Wanda Prince at the HD and faxed over lab result and ov note ______

## 2015-07-27 NOTE — Telephone Encounter (Signed)
error 

## 2015-07-28 ENCOUNTER — Ambulatory Visit: Payer: Medicaid Other | Admitting: Radiation Oncology

## 2015-07-28 ENCOUNTER — Telehealth: Payer: Self-pay | Admitting: *Deleted

## 2015-07-28 ENCOUNTER — Other Ambulatory Visit: Payer: Self-pay | Admitting: Internal Medicine

## 2015-07-28 ENCOUNTER — Ambulatory Visit
Admission: RE | Admit: 2015-07-28 | Discharge: 2015-07-28 | Disposition: A | Discharge status: Home / Self Care | Payer: Medicaid Other | Source: Ambulatory Visit | Attending: Radiation Oncology | Admitting: Radiation Oncology

## 2015-07-28 NOTE — Progress Notes (Signed)
The patient has been diagnosed with TB. We therefore have canceled her simulation for what was suspected to represent an early stage lung cancer. She will proceed with treatment for this new finding. I have asked to have her return to our clinic for a follow-up in 3 months.

## 2015-07-28 NOTE — Telephone Encounter (Signed)
CALLED PATIENT TO INFORM OF [facility] APPT. FOR 10-25-15, SPOKE WITH PATIENT AND SHE IS AWARE OF THIS APPT.

## 2015-07-31 NOTE — Telephone Encounter (Signed)
I have spoke with the pt's sister and she states nothing needed

## 2015-08-07 ENCOUNTER — Other Ambulatory Visit: Payer: Self-pay | Admitting: Gastroenterology

## 2015-08-07 DIAGNOSIS — K7689 Other specified diseases of liver: Secondary | ICD-10-CM

## 2015-08-18 ENCOUNTER — Ambulatory Visit
Admission: RE | Admit: 2015-08-18 | Discharge: 2015-08-18 | Disposition: A | Discharge status: Home / Self Care | Payer: Medicaid Other | Source: Ambulatory Visit | Attending: Gastroenterology | Admitting: Gastroenterology

## 2015-08-18 DIAGNOSIS — K7689 Other specified diseases of liver: Secondary | ICD-10-CM

## 2015-08-18 MED ORDER — GADOXETATE DISODIUM 0.25 MMOL/ML IV SOLN
5.0000 mL | Freq: Once | INTRAVENOUS | Status: AC | PRN
  Administered 2015-08-18: 5 mL via INTRAVENOUS

## 2015-10-03 ENCOUNTER — Ambulatory Visit: Payer: Medicaid Other | Admitting: Hematology and Oncology

## 2015-10-24 ENCOUNTER — Encounter: Payer: Self-pay | Admitting: Internal Medicine

## 2015-10-24 ENCOUNTER — Ambulatory Visit (INDEPENDENT_AMBULATORY_CARE_PROVIDER_SITE_OTHER)
Admission: RE | Admit: 2015-10-24 | Discharge: 2015-10-24 | Disposition: A | Discharge status: Home / Self Care | Payer: Medicaid Other | Source: Ambulatory Visit | Attending: Internal Medicine | Admitting: Internal Medicine

## 2015-10-24 ENCOUNTER — Ambulatory Visit (INDEPENDENT_AMBULATORY_CARE_PROVIDER_SITE_OTHER): Payer: Medicaid Other | Admitting: Internal Medicine

## 2015-10-24 VITALS — BP 124/74 | HR 78 | Ht 68.0 in | Wt 105.6 lb

## 2015-10-24 DIAGNOSIS — J449 Chronic obstructive pulmonary disease, unspecified: Secondary | ICD-10-CM | POA: Diagnosis not present

## 2015-10-24 DIAGNOSIS — Z720 Tobacco use: Secondary | ICD-10-CM | POA: Diagnosis not present

## 2015-10-24 DIAGNOSIS — J984 Other disorders of lung: Secondary | ICD-10-CM | POA: Diagnosis not present

## 2015-10-24 DIAGNOSIS — F17210 Nicotine dependence, cigarettes, uncomplicated: Secondary | ICD-10-CM

## 2015-10-24 MED ORDER — UMECLIDINIUM-VILANTEROL 62.5-25 MCG/INH IN AEPB: INHALATION_SPRAY | RESPIRATORY_TRACT | Status: DC

## 2015-10-24 NOTE — Assessment & Plan Note (Signed)

## 2015-10-24 NOTE — Assessment & Plan Note (Signed)
-   trial of Anoro  05/18/15 > better doe  06/01/2015  - Spirometry 06/01/2015  FEV1 0.54 (19%)  Ratio 39   - PFT's  07/24/2015  FEV1 0.65 (21 % ) ratio 29  p no % improvement from saba p anoro am prior to study with DLCO  12 % corrects to 23 % for alv volume    - 10/24/2015  After extensive coaching HFA effectiveness =    75%   Improved on anoro despite smoking actively   No change rx feasible  I had an extended discussion with the patient reviewing all relevant studies completed to date and  lasting 15 to 20 minutes of a 25 minute visit    Each maintenance medication was reviewed in detail including most importantly the difference between maintenance and prns and under what circumstances the prns are to be triggered using an action plan format that is not reflected in the computer generated alphabetically organized AVS.    Please see instructions for details which were reviewed in writing and the patient given a copy highlighting the part that I personally wrote and discussed at today's ov.

## 2015-10-24 NOTE — Patient Instructions (Addendum)
The TB test indicates you have been exposed but there is no evidence of active TB at this point  No change in medications   Please remember to go to the  xray department downstairs for your tests - we will call you with the results when they are available.

## 2015-10-24 NOTE — Progress Notes (Signed)
Follow Up New Consult  Right Upper Lobe Lung Nodule No prior radiation,no pacemaker,  Chronic SOB/Severe COPD CXR done 10/24/15  Results show  New 8x44m nodule projecting in left upper lobe lung, saw Dr. WMelvyn Novasyesterday  No /co coughing, no nausea, no coughing, does get short of breat, vitals taken wnl, 2:58 PM BP 116/65 mmHg  Pulse 83  Temp(Src) 98.4 F (36.9 C) (Oral)  Resp 20  Ht '5\' 8"'$  (1.727 m)  Wt 105 lb 6.4 oz (47.809 kg)  BMI 16.03 kg/m2  SpO2 97%  Wt Readings from Last 3 Encounters:  10/25/15 105 lb 6.4 oz (47.809 kg)  10/24/15 105 lb 9.6 oz (47.9 kg)  07/24/15 109 lb (49.442 kg)

## 2015-10-24 NOTE — Progress Notes (Signed)
Subjective:  Patient ID: KESTREL MIS, female   DOB: 29-Aug-1955    MRN: [id number]    Brief patient profile:  50 yowf quit smoking 04/21/15 with deteriorating sob x weeks > admit   Date of Admission: 04/22/2015   Date of Discharge: 05/07/2015 Attending Physician: Aldine Contes, MD  Discharge Diagnosis: Active Problems:  COPD with acute exacerbation ([place])  COPD exacerbation ([place])  Acute respiratory failure with hypoxemia ([place])  Encounter for imaging study to confirm orogastric (OG) tube placement  Encounter for nasogastric (NG) tube placement  Acute respiratory failure ([place])  Lung mass  Liver mass   05/18/2015 1st [facility] office visit/ Bruce Mayers  maint rx spiriva/advair but very poor technique  Chief Complaint  Patient presents with  . Pulmonary Consult    Referred by Dr. Suzanna Obey. Pt states that she was dxed with COPD years ago. She c/o increased SOB for the past month. She is SOB just sitting and doing nothing and unable to exert herself much due to SOB.   baseline = used [employer] parking / sometimes used scooter sometimes not  / now sob at rest  Has flutter not using/ very congested cough but no purulent mucus  Sleeps on 2 pillows ok Very poor insight into meds/ flutter/ purse lip  rec Pantoprazole (protonix) 40 mg   Take  30-60 min before first meal of the day and Pepcid (famotidine)  20 mg one @  bedtime until return to office - this is the best way to tell whether stomach acid is contributing to your problem.   GERD  Diet   Stop advair and spiriva and just take anoro 2 good drags each am / one click  For breathing >  Nebulizer with albuterol every 4 hours if needed (if you can't catch your breath)  For cough > flutter valve as much as you can     06/01/2015  f/u ov/Jamariya Davidoff re: GOLD IV COPD maint on anoro Chief Complaint  Patient presents with  . Follow-up    Breathing has improved some. She is using albuterol inhaler 2 x daily and neb 1 x daily on average.    now able to walk at HT leaning on cart/ no noct symptoms/ still not smoking  rec Plan A =  [employer] = Continue Anoro one click each am  Plan B=   Backup Only use your albuterol as a rescue medication  Only use the nebulizer if you try the inhaler first and it doesn't work, ok to use up to every 4 hours if you must    07/24/2015  f/u ov/Mariann Palo re: copd GOLD IV  maint rx anoro  Chief Complaint  Patient presents with  . Follow-up    PFT done at [facility]. Breathing is unchanged. Still using albuterol inhaler 2 x and neb 1 x daily on average.   doe x across the room = MMRC4  = sob if tries to leave home or while getting dressed   rec Pos Quantiferon Gold TB > neg afb smear /culture > set up for [place] for navigational bx November 22 2015    Set up for RT by [facility] 10/25/15     10/24/2015  f/u ov/Jasun Gasparini re:  GOLD IV copd/ maint rx anoro / still smoking  Chief Complaint  Patient presents with  . Follow-up    Breathing has been worse due to humid, hot weather. She has been using albuterol inhaler 3 x daily on average.   doe actually better than before =  MMRC3 = can't walk 100 yards even at a slow pace at a flat grade s stopping due to sob      No obvious day to day or daytime variability or assoc excess/ purulent sputum or mucus plugs  cp or chest tightness, subjective wheeze or overt sinus or hb symptoms. No unusual exp hx or h/o childhood pna/ asthma or knowledge of premature birth.  Sleeping ok without nocturnal  or early am exacerbation  of respiratory  c/o's or need for noct saba. Also denies any obvious fluctuation of symptoms with weather or environmental changes or other aggravating or alleviating factors except as outlined above   Current Medications, Allergies, Complete Past Medical History, Past Surgical History, Family History, and Social History were reviewed in [employer] record.  ROS  The following are not active complaints unless bolded sore throat, dysphagia,  dental problems, itching, sneezing,  nasal congestion or excess/ purulent secretions, ear ache,   fever, chills, sweats, unintended wt loss, classically pleuritic or exertional cp, hemoptysis,  orthopnea pnd or leg swelling, presyncope, palpitations, abdominal pain, anorexia, nausea, vomiting, diarrhea  or change in bowel or bladder habits, change in stools or urine, dysuria,hematuria,  rash, arthralgias, visual complaints, headache, numbness, weakness or ataxia or problems with walking or coordination,  change in mood/affect or memory.             Objective:  Physical Exam:  Thin amb wf walks with cane but came in w/c   07/24/2015        109   06/01/2015         112   05/07/15 105 lb 4.8 oz (47.764 kg)  01/12/14 115 lb 15.4 oz (52.6 kg)  09/29/12 135 lb (61.236 kg)    Vital signs reviewed   HEENT: nl dentition, turbinates, and oropharynx. Nl external ear canals without cough reflex   NECK :  without JVD/Nodes/TM/ nl carotid upstrokes bilaterally   LUNGS: no acc muscle use,  slt barrel contour chest  Distant bs marked increased Texp s true wheeze   CV:  RRR  no s3 or murmur or increase in P2, no edema   ABD:  soft and nontender with  Mid to late Pos Hoover's  the supine position. No bruits or organomegaly, bowel sounds nl  MS:  Nl gait/ ext warm without deformities, calf tenderness, cyanosis or clubbing No obvious joint restrictions   SKIN: warm and dry without lesions    NEURO:  alert, approp, nl sensorium with  no motor deficits    CXR PA and Lateral:   10/24/2015 :    I personally reviewed images and agree with radiology impression as follows:    1. COPD. New 8 x 12 mm nodule projecting in the left upper lobe. There is no alveolar pneumonia.    Assessment:

## 2015-10-24 NOTE — Assessment & Plan Note (Addendum)
See ct 05/05/15  - PET 06/22/2015 1. 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 2. Mildly hypermetabolic nodule of the LEFT lung base which is decreased slightly in size in short interval favors infectious or inflammatory process. 3. No mediastinal lymphadenopathy metastatic mild hypermetabolic mediastinal adenopathy. - Quant Gold 07/24/2015 > POS > referred to HD > afb neg smear/cultures collected 07/29/15 x 3 faxed to pulmo  09/22/15  >  09/26/2015  referred to Dr Lamonte Sakai ? Navigational bx with afb stain/culture before considering RT?    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

## 2015-10-25 ENCOUNTER — Encounter: Payer: Self-pay | Admitting: Radiation Oncology

## 2015-10-25 ENCOUNTER — Ambulatory Visit
Admission: RE | Admit: 2015-10-25 | Discharge: 2015-10-25 | Disposition: A | Discharge status: Home / Self Care | Payer: Medicaid Other | Source: Ambulatory Visit | Attending: Radiation Oncology | Admitting: Radiation Oncology

## 2015-10-25 VITALS — BP 116/65 | HR 83 | Temp 98.4°F | Resp 20 | Ht 68.0 in | Wt 105.4 lb

## 2015-10-25 DIAGNOSIS — Z87898 Personal history of other specified conditions: Secondary | ICD-10-CM | POA: Insufficient documentation

## 2015-10-25 DIAGNOSIS — I10 Essential (primary) hypertension: Secondary | ICD-10-CM | POA: Insufficient documentation

## 2015-10-25 DIAGNOSIS — Z8659 Personal history of other mental and behavioral disorders: Secondary | ICD-10-CM | POA: Diagnosis not present

## 2015-10-25 DIAGNOSIS — C3411 Malignant neoplasm of upper lobe, right bronchus or lung: Secondary | ICD-10-CM | POA: Insufficient documentation

## 2015-10-25 DIAGNOSIS — F17210 Nicotine dependence, cigarettes, uncomplicated: Secondary | ICD-10-CM | POA: Insufficient documentation

## 2015-10-25 DIAGNOSIS — J449 Chronic obstructive pulmonary disease, unspecified: Secondary | ICD-10-CM | POA: Insufficient documentation

## 2015-10-25 DIAGNOSIS — G40909 Epilepsy, unspecified, not intractable, without status epilepticus: Secondary | ICD-10-CM | POA: Diagnosis not present

## 2015-10-25 DIAGNOSIS — J984 Other disorders of lung: Secondary | ICD-10-CM

## 2015-10-25 NOTE — Progress Notes (Signed)
Quick Note:  Spoke with pt and notified of results per Dr. Wert. Pt verbalized understanding and denied any questions.  ______ 

## 2015-10-25 NOTE — Progress Notes (Signed)
Please see the Nurse Progress Note in the MD Initial Consult Encounter for this patient. 

## 2015-10-25 NOTE — Progress Notes (Signed)
Radiation Oncology         [phone]) [phone] ________________________________  Name: Wanda Prince MRN: [id number]  Date: 10/25/2015  DOB: 1956/04/17  Follow-Up Visit Note  CC: Wanda Obey, MD  Wanda Lose, MD  Diagnosis:   The encounter diagnosis was Primary cancer of right upper lobe of lung ([place]).  Narrative:  The patient returns today for routine follow-up. She has a history of chronic shortness of breath and severe COPD. She was scheduled to have SBRT CT simulation in March 2017 but was diagnosed with TB and was told to come back to the clinic in 3 months. She had a chest x-ray 10/24/2015, revealing a new 8 x 12 mm nodule in the left upper lobe of the lung. She denies coughing or nausea. She mentions she does get short of breath.  She was told she had TB from Dr. Gustavus Prince office and was quarantined for a while. She had a saliva test that tested negative and was told by the [facility] of [facility] that she did not have TB and it was a false positive. 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 They are going to follow up with the health department.   ALLERGIES:  has No Known Allergies.  Meds: Current Outpatient Prescriptions  Medication Sig Dispense Refill  . acetaminophen (TYLENOL) 500 MG tablet Take 1,000 mg by mouth every 6 (six) hours as needed for pain.    Marland Kitchen albuterol (PROVENTIL HFA;VENTOLIN HFA) 108 (90 BASE) MCG/ACT inhaler Inhale 2 puffs into the lungs every 4 (four) hours as needed for wheezing or shortness of breath.     Marland Kitchen aspirin 81 MG chewable tablet Chew 81 mg by mouth daily.    Marland Kitchen b complex vitamins tablet Take 1 tablet by mouth 2 (two) times daily. 62 tablet 0  . cholecalciferol (VITAMIN D) 1000 UNITS tablet Take 1,000 Units by mouth daily.    . cyclobenzaprine (FLEXERIL) 5 MG tablet Take 5 mg by mouth every 8 (eight) hours as needed for muscle spasms.     . folic acid (FOLVITE) 1 MG tablet Take 1 mg by mouth daily.    Marland Kitchen gabapentin (NEURONTIN) 300 MG capsule  Take 300 mg by mouth at bedtime.     . levETIRAcetam (KEPPRA) 500 MG tablet Take 1 tablet (500 mg total) by mouth 2 (two) times daily. 62 tablet 0  . metoprolol succinate (TOPROL XL) 25 MG 24 hr tablet Take 25 mg by mouth daily.     . Multiple Vitamin (MULITIVITAMIN WITH MINERALS) TABS Take 1 tablet by mouth daily. 30 tablet 6  . naltrexone (DEPADE) 50 MG tablet Take 50 mg by mouth daily.    . pantoprazole (PROTONIX) 20 MG tablet Take 20 mg by mouth daily. 2 tabs daily    . thiamine 100 MG tablet Take 1 tablet (100 mg total) by mouth daily. 30 tablet 6  . umeclidinium-vilanterol (ANORO ELLIPTA) 62.5-25 MCG/INH AEPB 1 puff once daily    . albuterol (PROVENTIL) (2.5 MG/3ML) 0.083% nebulizer solution Take 3 mLs (2.5 mg total) by nebulization every 2 (two) hours as needed for wheezing or shortness of breath. (Patient not taking: Reported on 10/24/2015) 75 mL 12  . spironolactone (ALDACTONE) 25 MG tablet Take 25 mg by mouth daily.      No current facility-administered medications for this encounter.    Physical Findings: The patient is in no acute distress. Patient is alert and oriented.  height is '5\' 8"'$  (1.727 m) and weight is  105 lb 6.4 oz (47.809 kg). Her oral temperature is 98.4 F (36.9 C). Her blood pressure is 116/65 and her pulse is 83. Her respiration is 20 and oxygen saturation is 97%.   Lab Findings: Lab Results  Component Value Date   WBC 13.8* 06/07/2015   HGB 15.4* 06/07/2015   HCT 45.6 06/07/2015   MCV 97.4 06/07/2015   PLT 240 06/07/2015     Radiographic Findings: Dg Chest 2 View  10/24/2015  CLINICAL DATA:  Shortness of breath, history of COPD, smoking, right upper lobe lung malignancy. EXAM: CHEST  2 VIEW COMPARISON:  Chest x-ray of May 03, 2015 FINDINGS: The lungs remain hyperinflated with hemidiaphragm flattening. There is increased conspicuity of an approximately 8 x 12 mm nodule projecting over the posterior aspect of the left sixth rib. There is no alveolar  infiltrate. There is no significant pleural effusion. The heart and pulmonary vascularity are normal. There is calcification in the wall of the aortic arch. There is a ventriculoperitoneal shunt tube in place. The bony structures are unremarkable. IMPRESSION: 1. COPD. New 8 x 12 mm nodule projecting in the left upper lobe. There is no alveolar pneumonia. 2. No CHF.  Aortic atherosclerosis. Electronically Signed   By: Wanda  Prince M.D.   On: 10/24/2015 15:11    Impression: Wanda Prince is a 60 yo woman with possible lung cancer.  The patient was originally seen with what was felt to be a new diagnosis of lung cancer. 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 The patient states that she hasn't been told that she does not have active disease. She has had a new possible nodule show up on chest x-ray on the left. Original concerning findings on the right.   Plan: We discussed getting a CT scan of the chest and then deciding how best to proceed. Options may include proceeding with stereotactic body radiation treatment, proceeding with a biopsy which probably would be necessary in this complex case, or continuing to follow the patient. We will discuss these options further with the patient wants her CT scan is complete..   ------------------------------------------------  Wanda Gross, MD, PhD    This document serves as a record of services personally performed by Wanda Rudd, MD. It was created on his behalf by  Wanda Prince, a trained medical scribe. The creation of this record is based on the scribe's personal observations and the provider's statements to them. This document has been checked and approved by the attending provider.

## 2015-10-27 ENCOUNTER — Other Ambulatory Visit: Payer: Self-pay | Admitting: Radiation Oncology

## 2015-10-27 ENCOUNTER — Encounter: Payer: Self-pay | Admitting: Radiation Oncology

## 2015-10-27 DIAGNOSIS — C3411 Malignant neoplasm of upper lobe, right bronchus or lung: Secondary | ICD-10-CM

## 2015-10-30 ENCOUNTER — Telehealth: Payer: Self-pay | Admitting: *Deleted

## 2015-10-30 NOTE — Telephone Encounter (Signed)
Called patient to inform of lab @ 11:45 am on 11-03-15 and her CT on 11-03-15- arrival time - 12:30 pm , patient to have clear liquids only 4 hrs. Prior to test, test to be @ [facility], spoke with patient and she is aware of these appts.

## 2015-11-03 ENCOUNTER — Ambulatory Visit
Admission: RE | Admit: 2015-11-03 | Discharge: 2015-11-03 | Disposition: A | Discharge status: Home / Self Care | Payer: Medicaid Other | Source: Ambulatory Visit | Attending: Radiation Oncology | Admitting: Radiation Oncology

## 2015-11-03 ENCOUNTER — Ambulatory Visit (HOSPITAL_COMMUNITY)
Admission: RE | Admit: 2015-11-03 | Discharge: 2015-11-03 | Disposition: A | Discharge status: Home / Self Care | Payer: Medicaid Other | Source: Ambulatory Visit | Attending: Radiation Oncology | Admitting: Radiation Oncology

## 2015-11-03 DIAGNOSIS — R918 Other nonspecific abnormal finding of lung field: Secondary | ICD-10-CM | POA: Diagnosis not present

## 2015-11-03 DIAGNOSIS — C3411 Malignant neoplasm of upper lobe, right bronchus or lung: Secondary | ICD-10-CM | POA: Diagnosis not present

## 2015-11-03 LAB — BUN AND CREATININE (CC13)
BUN: 10.3 mg/dL (ref 7.0–26.0)
Creatinine: 0.7 mg/dL (ref 0.6–1.1)
EGFR: >90 mL/min/{1.73_m2} (ref >90)

## 2015-11-03 MED ORDER — IOPAMIDOL (ISOVUE-300) INJECTION 61%
75.0000 mL | Freq: Once | INTRAVENOUS | Status: AC | PRN
  Administered 2015-11-03: 75 mL via INTRAVENOUS

## 2015-11-17 ENCOUNTER — Telehealth: Payer: Self-pay | Admitting: Radiation Oncology

## 2015-11-17 DIAGNOSIS — R911 Solitary pulmonary nodule: Secondary | ICD-10-CM

## 2015-11-17 NOTE — Telephone Encounter (Signed)
I reviewed the CT images with the patient's sister Ms. Waye. Dr. Lisbeth Renshaw has recommended proceeding with repeat CT in 6 months. Orders placed and scheduling notified.

## 2015-11-20 IMAGING — MR MR ABDOMEN WO/W CM
7 of 17 series · 19 of 48 positions shown · IV contrast (5ml eovist)
Comparison: 10/13/2014

CLINICAL DATA: Followup liver lesion.  History of cirrhosis.

EXAM:
MRI ABDOMEN WITHOUT AND WITH CONTRAST
TECHNIQUE: Multiplanar multisequence MR imaging of the abdomen was performed
both before and after the administration of intravenous contrast.
CONTRAST:  5 cc of Eovist

[Series 2: T2 · coronal · 5.0mm · 1.41mm/px · 1 of 34 slices shown]
[im 1/34]
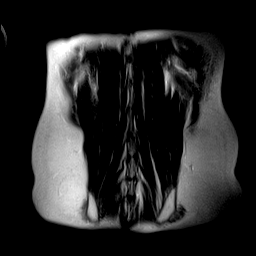

[Series 3: axial in out · axial · 6.0mm · 0.68mm/px · 1 of 60 slices shown]
[im 1/60]
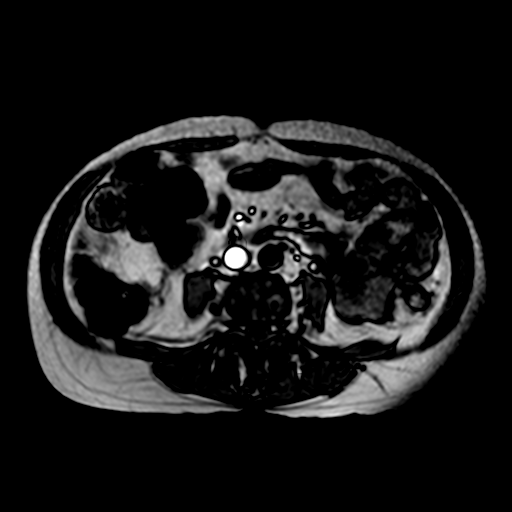

[Series 4: T1 dynamic · axial · non-contrast · 2.2mm · 0.72mm/px · z∈[-191,+0]mm · 3 of 88 slices shown]
[im 1/88]
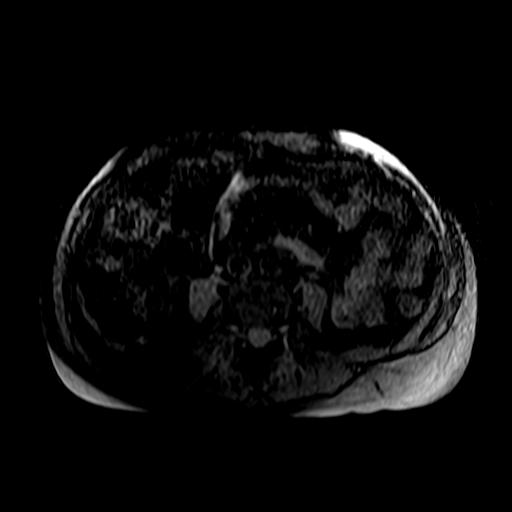
[im 44/88]
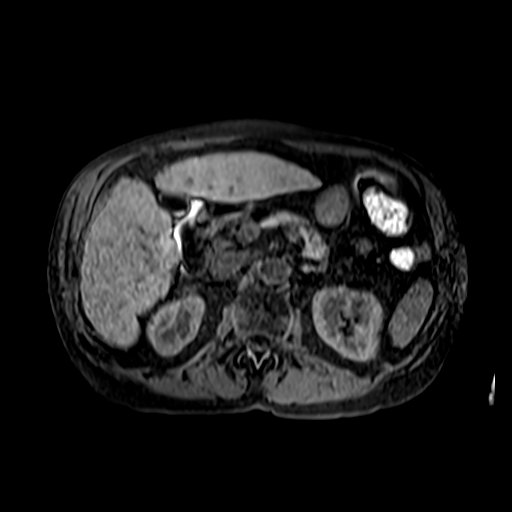
[im 88/88]
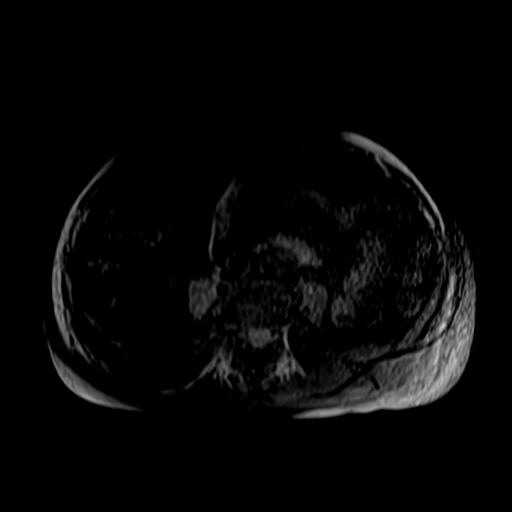

[Series 5: post immed · axial · 2.2mm · 0.72mm/px · z∈[-191,+0]mm · 3 of 88 slices shown]
[im 1/88]
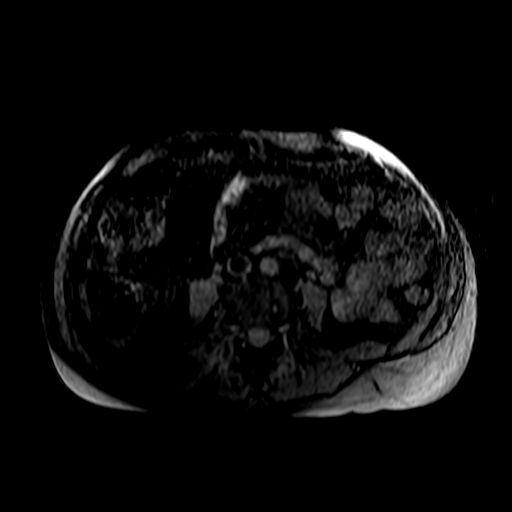
[im 44/88]
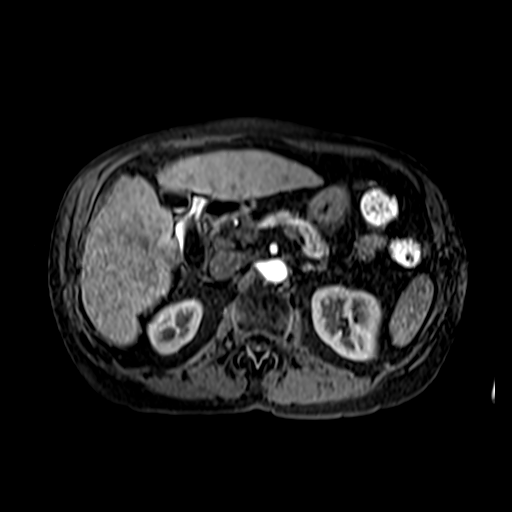
[im 88/88]
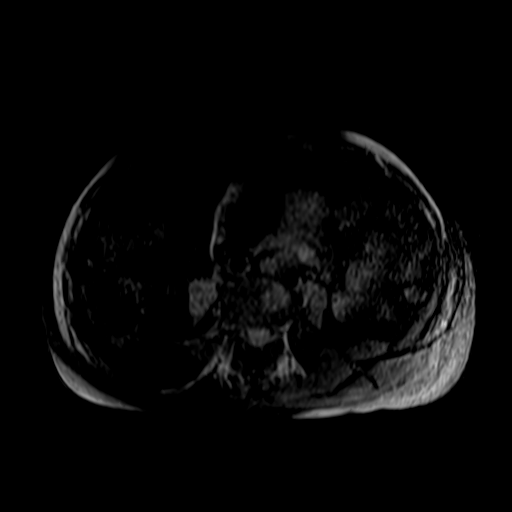

[Series 6: post immed_sub · axial · 2.2mm · 0.72mm/px · z∈[-191,+0]mm · 4 of 88 slices shown]
[im 1/88]
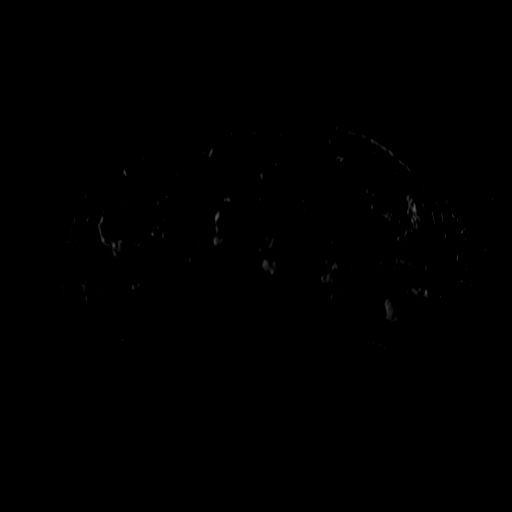
[im 30/88]
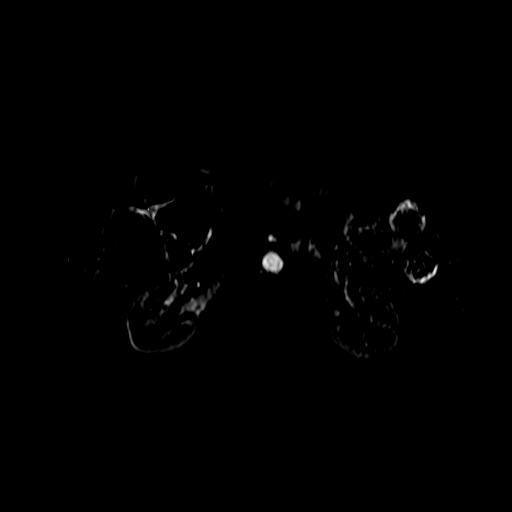
[im 59/88]
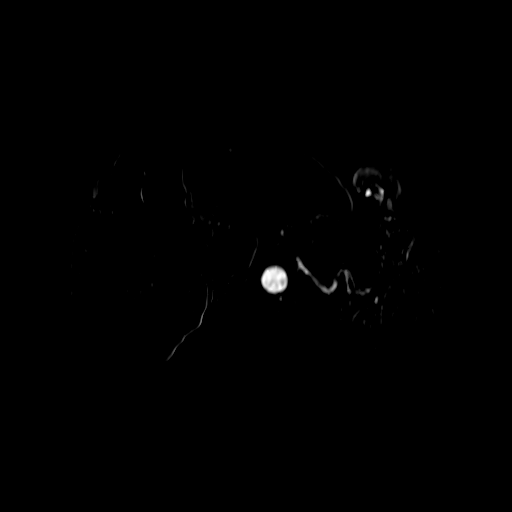
[im 88/88]
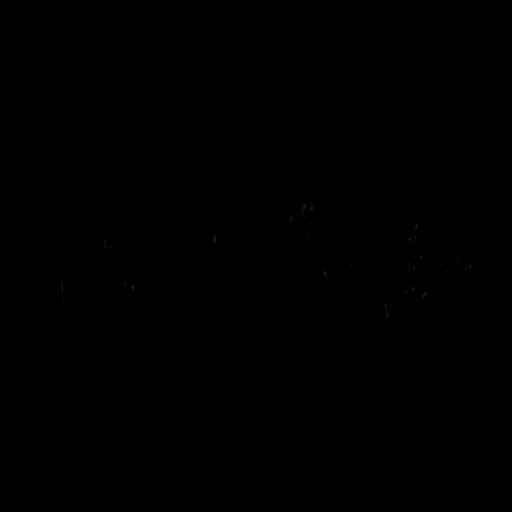

[Series 7: post 45 sec · axial · 2.2mm · 0.72mm/px · z∈[-191,+0]mm · 4 of 88 slices shown]
[im 1/88]
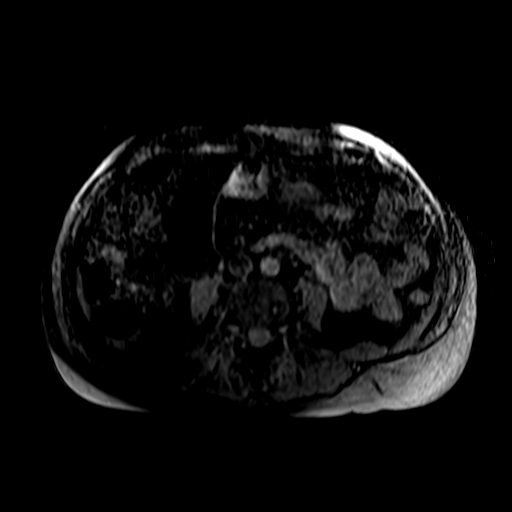
[im 30/88]
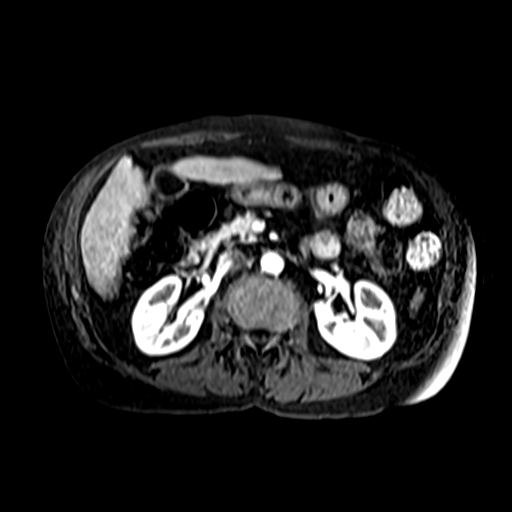
[im 59/88]
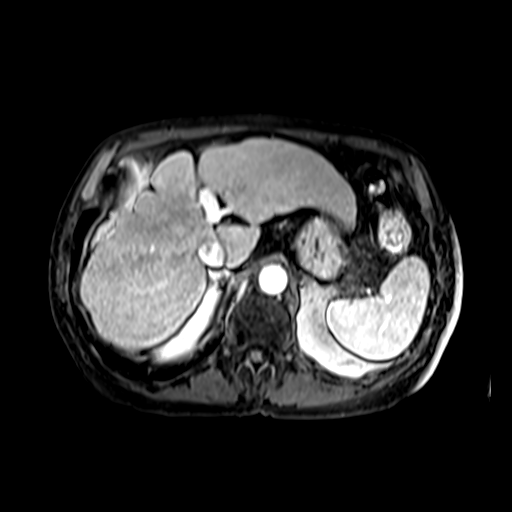
[im 88/88]
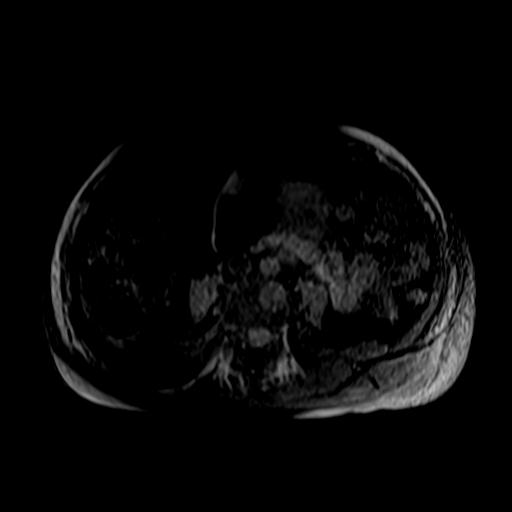

[Series 8: post 45 sec_sub · axial · 2.2mm · 0.72mm/px · z∈[-191,-64]mm · 3 of 88 slices shown]
[im 1/88]
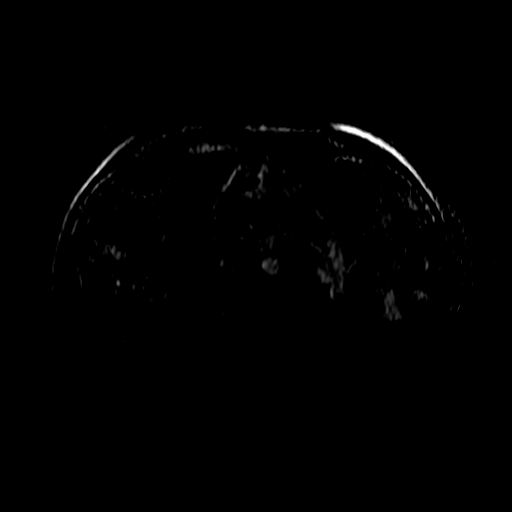
[im 30/88]
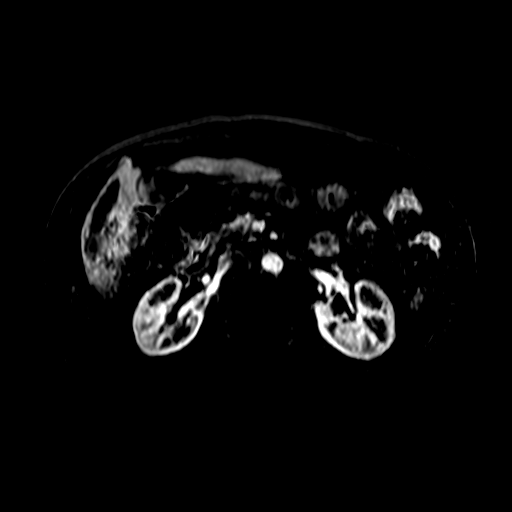
[im 59/88]
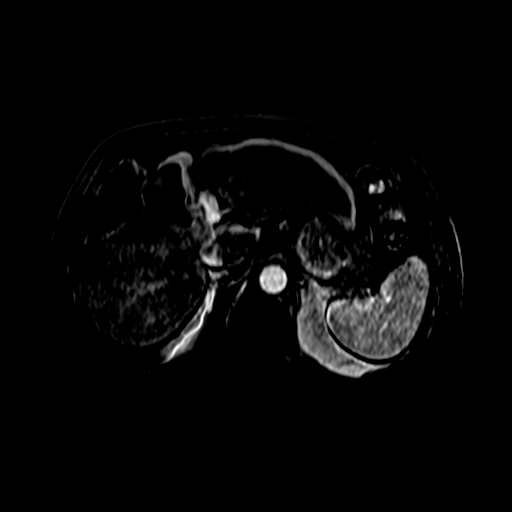

[19 of 48 positions shown; findings below may reference images not displayed]

FINDINGS: Lower chest:  There is no pleural fluid identified.

Hepatobiliary: The liver has a diffusely nodular contour or
compatible with cirrhosis. On the arterial phase portion of the exam
there are no hyperenhancing liver lesions identified. On the portal
venous phase portion of the exam there is a enhancing structure
which measures 9 mm and corresponds to the previously identified
enhancing lesion from 10/13/2014. This is not significantly changed
in size in the interval, image 42 of series 7. This lesion be come
isointense to adjacent liver parenchyma on the 90 second delayed
images.

Pancreas: The no focal pancreas abnormality. The pancreatic duct is
normal in caliber. No mass.

Spleen: Unremarkable appearance of the spleen. The spleen measures
9.4 cm in cranial caudal dimension.

Adrenals/Urinary Tract: The adrenal glands are on unremarkable.
Normal appearance of both kidneys.

Stomach/Bowel: The stomach and the upper abdominal bowel loops have
a normal caliber. No obstruction.

Vascular/Lymphatic: Normal appearance of the abdominal aorta. No
upper abdominal adenopathy identified.

Other: No ascites or focal fluid collections.

Musculoskeletal: No abnormal signal from within the bone marrow.
IMPRESSION: 1. Stable appearance of small enhancing structure within segment 5
of the liver measuring 9 mm. This remains indeterminate. Continued
followup of this lesion is recommended with the next study obtained
at 6-12 months.
2. Morphologic features of the liver compatible with cirrhosis.

## 2015-11-22 ENCOUNTER — Institutional Professional Consult (permissible substitution): Payer: Medicaid Other | Admitting: Emergency Medicine

## 2015-12-13 ENCOUNTER — Other Ambulatory Visit: Payer: Self-pay | Admitting: Internal Medicine

## 2015-12-29 ENCOUNTER — Other Ambulatory Visit: Payer: Self-pay | Admitting: Family Medicine

## 2015-12-29 DIAGNOSIS — Z1231 Encounter for screening mammogram for malignant neoplasm of breast: Secondary | ICD-10-CM

## 2016-01-06 ENCOUNTER — Other Ambulatory Visit: Payer: Self-pay | Admitting: Gastroenterology

## 2016-01-06 DIAGNOSIS — K7469 Other cirrhosis of liver: Secondary | ICD-10-CM

## 2016-01-06 DIAGNOSIS — K769 Liver disease, unspecified: Secondary | ICD-10-CM

## 2016-01-12 ENCOUNTER — Other Ambulatory Visit: Payer: Self-pay | Admitting: Internal Medicine

## 2016-01-16 ENCOUNTER — Ambulatory Visit
Admission: RE | Admit: 2016-01-16 | Discharge: 2016-01-16 | Disposition: A | Discharge status: Home / Self Care | Payer: Medicaid Other | Source: Ambulatory Visit | Attending: Gastroenterology | Admitting: Gastroenterology

## 2016-01-16 DIAGNOSIS — K7469 Other cirrhosis of liver: Secondary | ICD-10-CM

## 2016-01-16 DIAGNOSIS — K769 Liver disease, unspecified: Secondary | ICD-10-CM

## 2016-01-16 MED ORDER — GADOXETATE DISODIUM 0.25 MMOL/ML IV SOLN
6.0000 mL | Freq: Once | INTRAVENOUS | Status: AC | PRN
  Administered 2016-01-16: 6 mL via INTRAVENOUS

## 2016-01-17 IMAGING — CR DG CHEST 1V PORT
1 series · 1 of 1 positions shown · non-contrast
Comparison: Chest radiograph performed 04/22/2015

CLINICAL DATA: Acute onset of shortness of breath. Initial
encounter.

EXAM:
PORTABLE CHEST 1 VIEW

[AP]
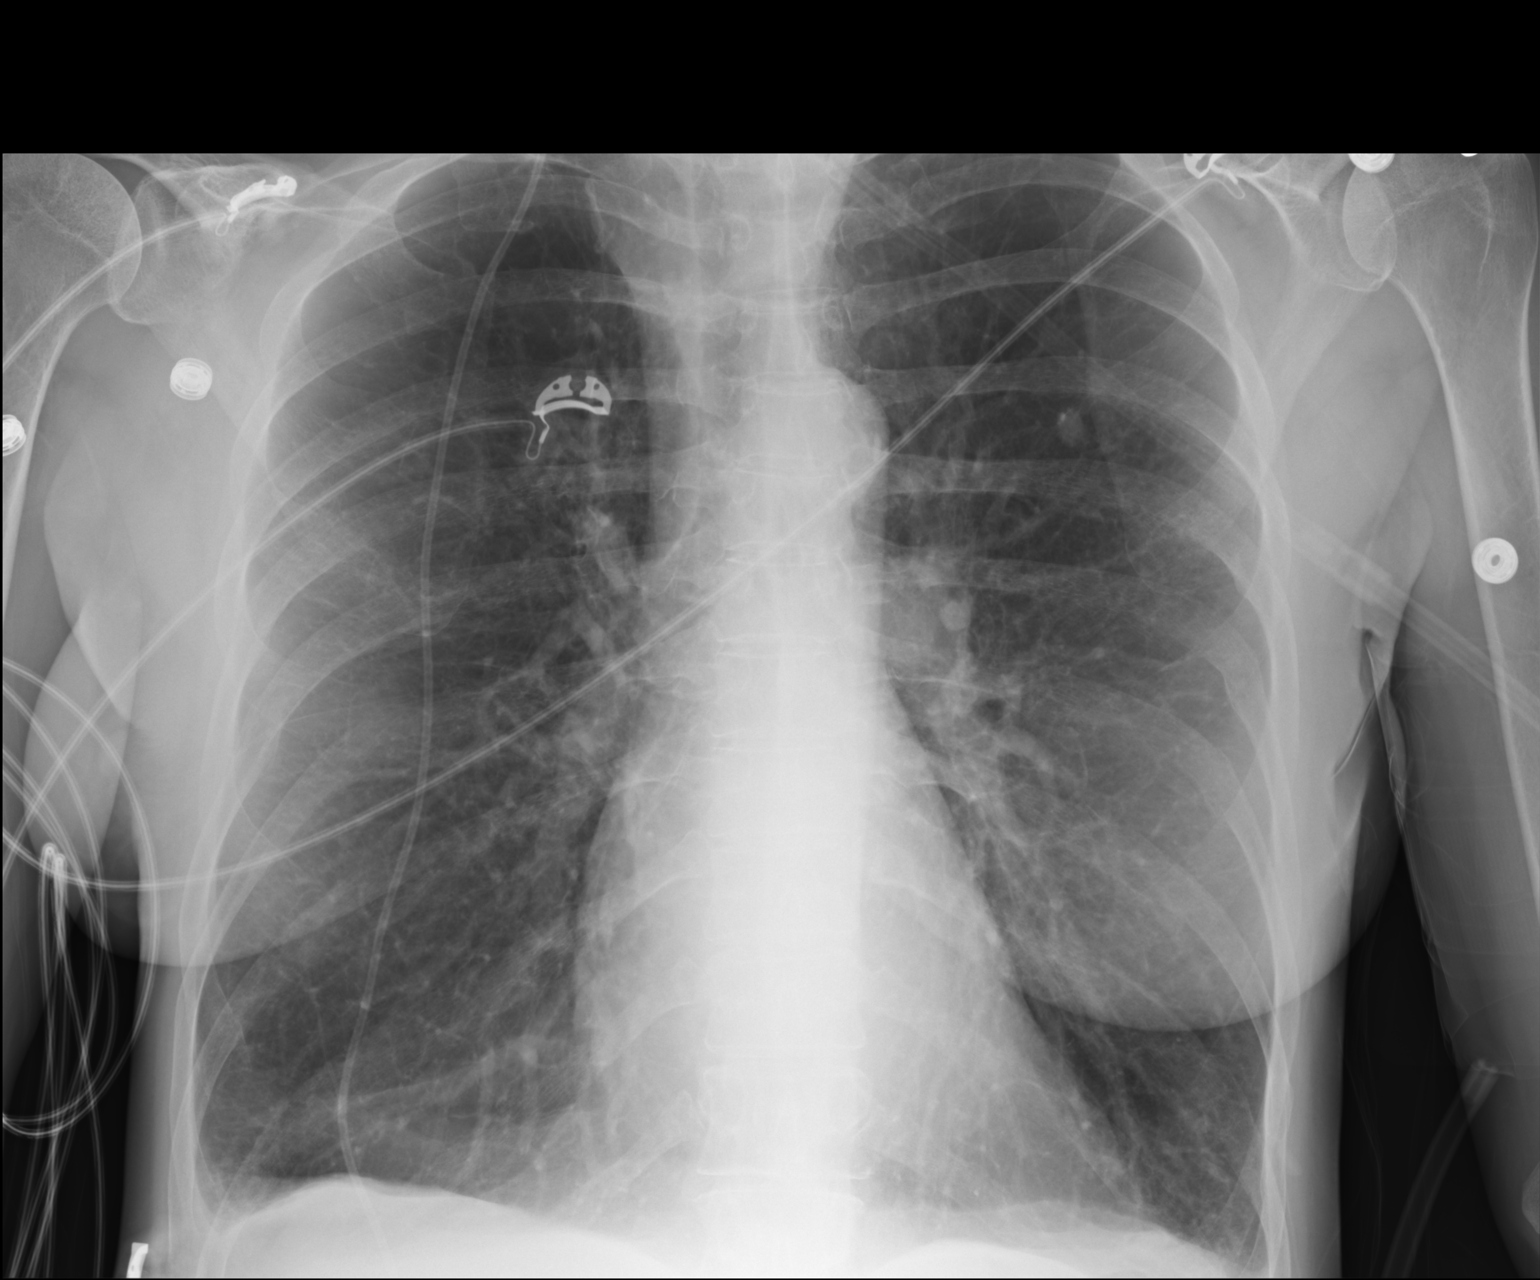

[1 of 1 positions shown; findings below may reference images not displayed]

FINDINGS: The lungs are hyperexpanded, with flattening of the hemidiaphragms,
compatible with COPD. No pleural effusion or pneumothorax is seen.

The cardiomediastinal silhouette remains normal in size. No acute
osseous abnormalities are identified. Mild chronic left-sided rib
deformities are noted. A right-sided ventriculoperitoneal shunt is
partially imaged.
IMPRESSION: Findings of COPD.  No acute cardiopulmonary process seen.

## 2016-01-19 IMAGING — CR DG CHEST 1V PORT
1 series · 1 of 1 positions shown · non-contrast
Comparison: Portable exam 4666 hours compared to 04/23/2015

CLINICAL DATA: Central line placement, meningitis, ventricular
shunt, seizure disorder, COPD

EXAM:
PORTABLE CHEST 1 VIEW

[AP]
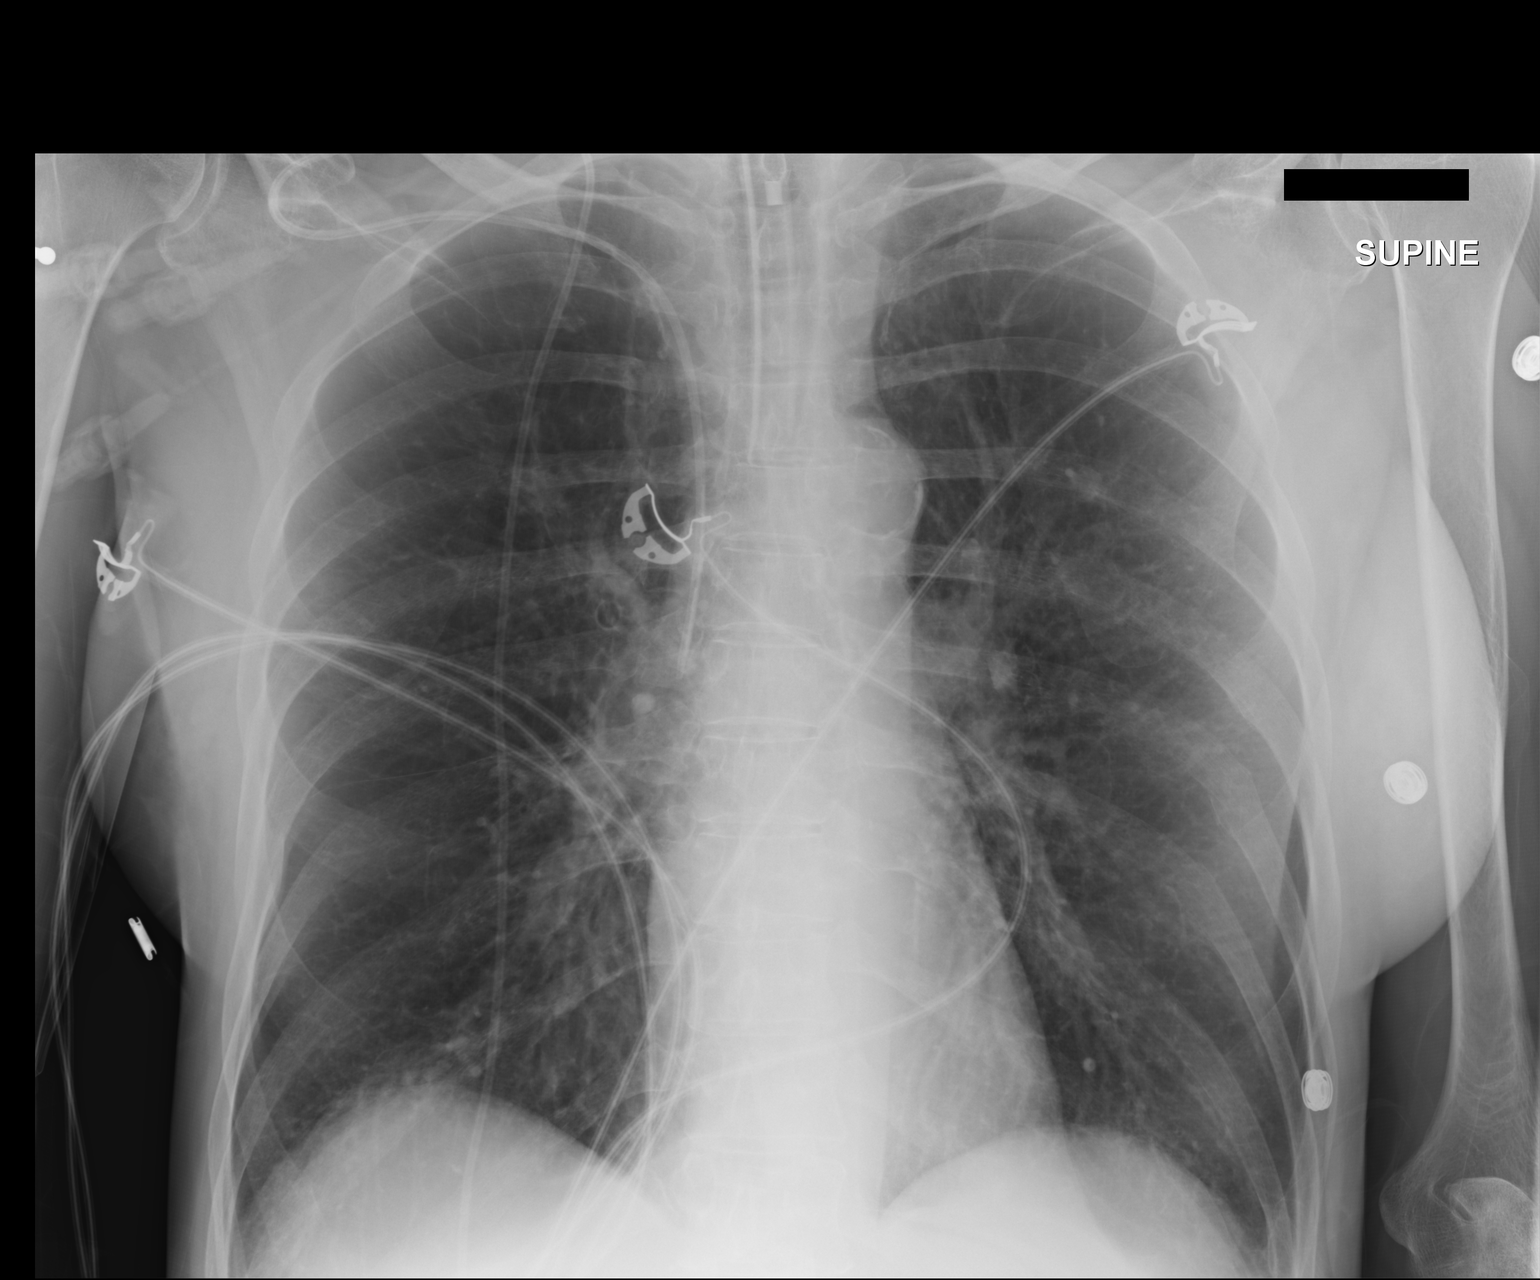

[1 of 1 positions shown; findings below may reference images not displayed]

FINDINGS: Tip of endotracheal tube projects 3.8 cm above carina.

RIGHT subclavian central venous catheter tip projects over SVC.

Shunt tubing traverses RIGHT hemi thorax.

Normal heart size, mediastinal contours, and pulmonary vascularity.

Atherosclerotic calcification aorta.

Lungs emphysematous with minimal atelectasis at RIGHT base.

No additional infiltrate, pleural effusion or pneumothorax.

Osseous structures stable.
IMPRESSION: No pneumothorax following RIGHT subclavian line placement.

Satisfactory endotracheal tube position.

COPD changes with minimal RIGHT basilar atelectasis.

## 2016-01-19 IMAGING — CR DG ABDOMEN 1V
1 series · 1 of 1 positions shown · non-contrast
Comparison: February 28, 2012.

CLINICAL DATA: Orogastric tube placement.

EXAM:
ABDOMEN - 1 VIEW

[AP]
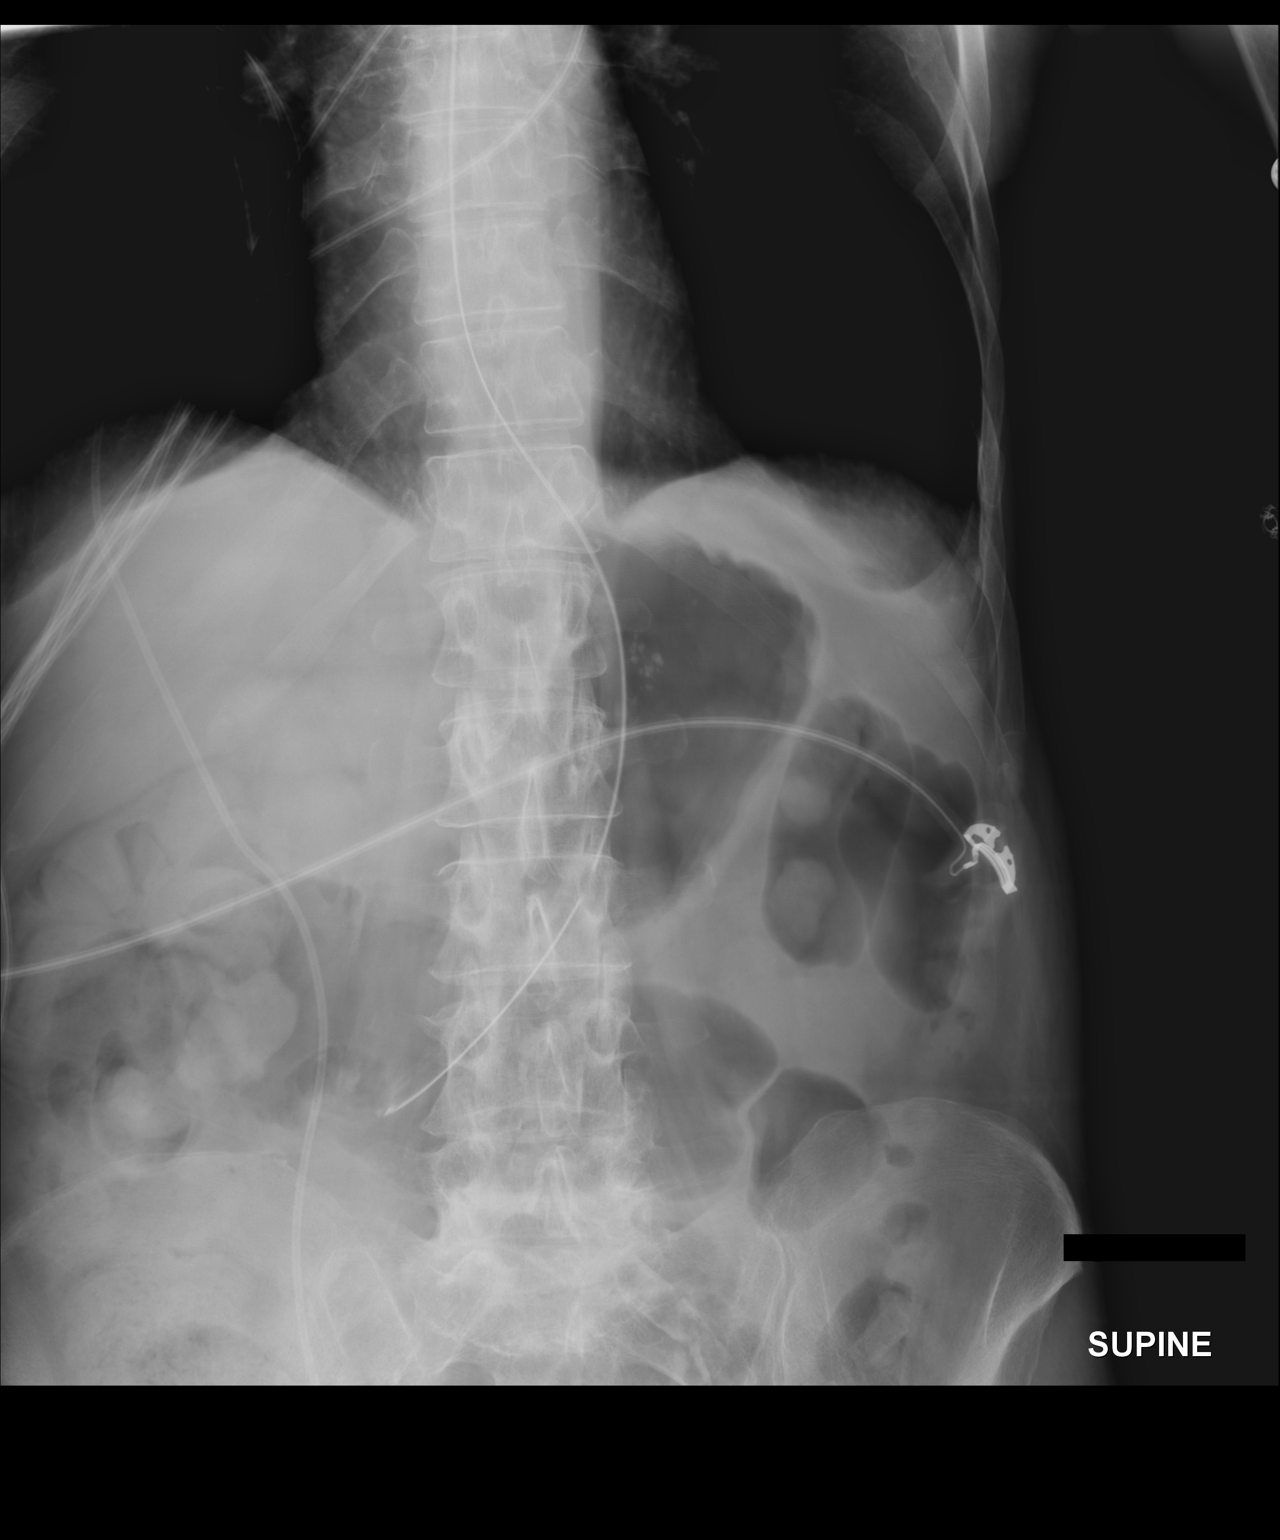

[1 of 1 positions shown; findings below may reference images not displayed]

FINDINGS: Distal tip of feeding tube is seen in expected position of distal
stomach. Stool is noted in the right colon. Right-sided
ventriculoperitoneal shunt is again noted. No abnormal bowel gas
pattern is noted.
IMPRESSION: Distal tip of feeding tube seen in expected position of distal
stomach.

## 2016-01-20 IMAGING — CR DG ABD PORTABLE 1V
1 series · 1 of 1 positions shown · non-contrast
Comparison: 04/25/2015

CLINICAL DATA: NG tube placement

EXAM:
PORTABLE ABDOMEN - 1 VIEW

[AP]
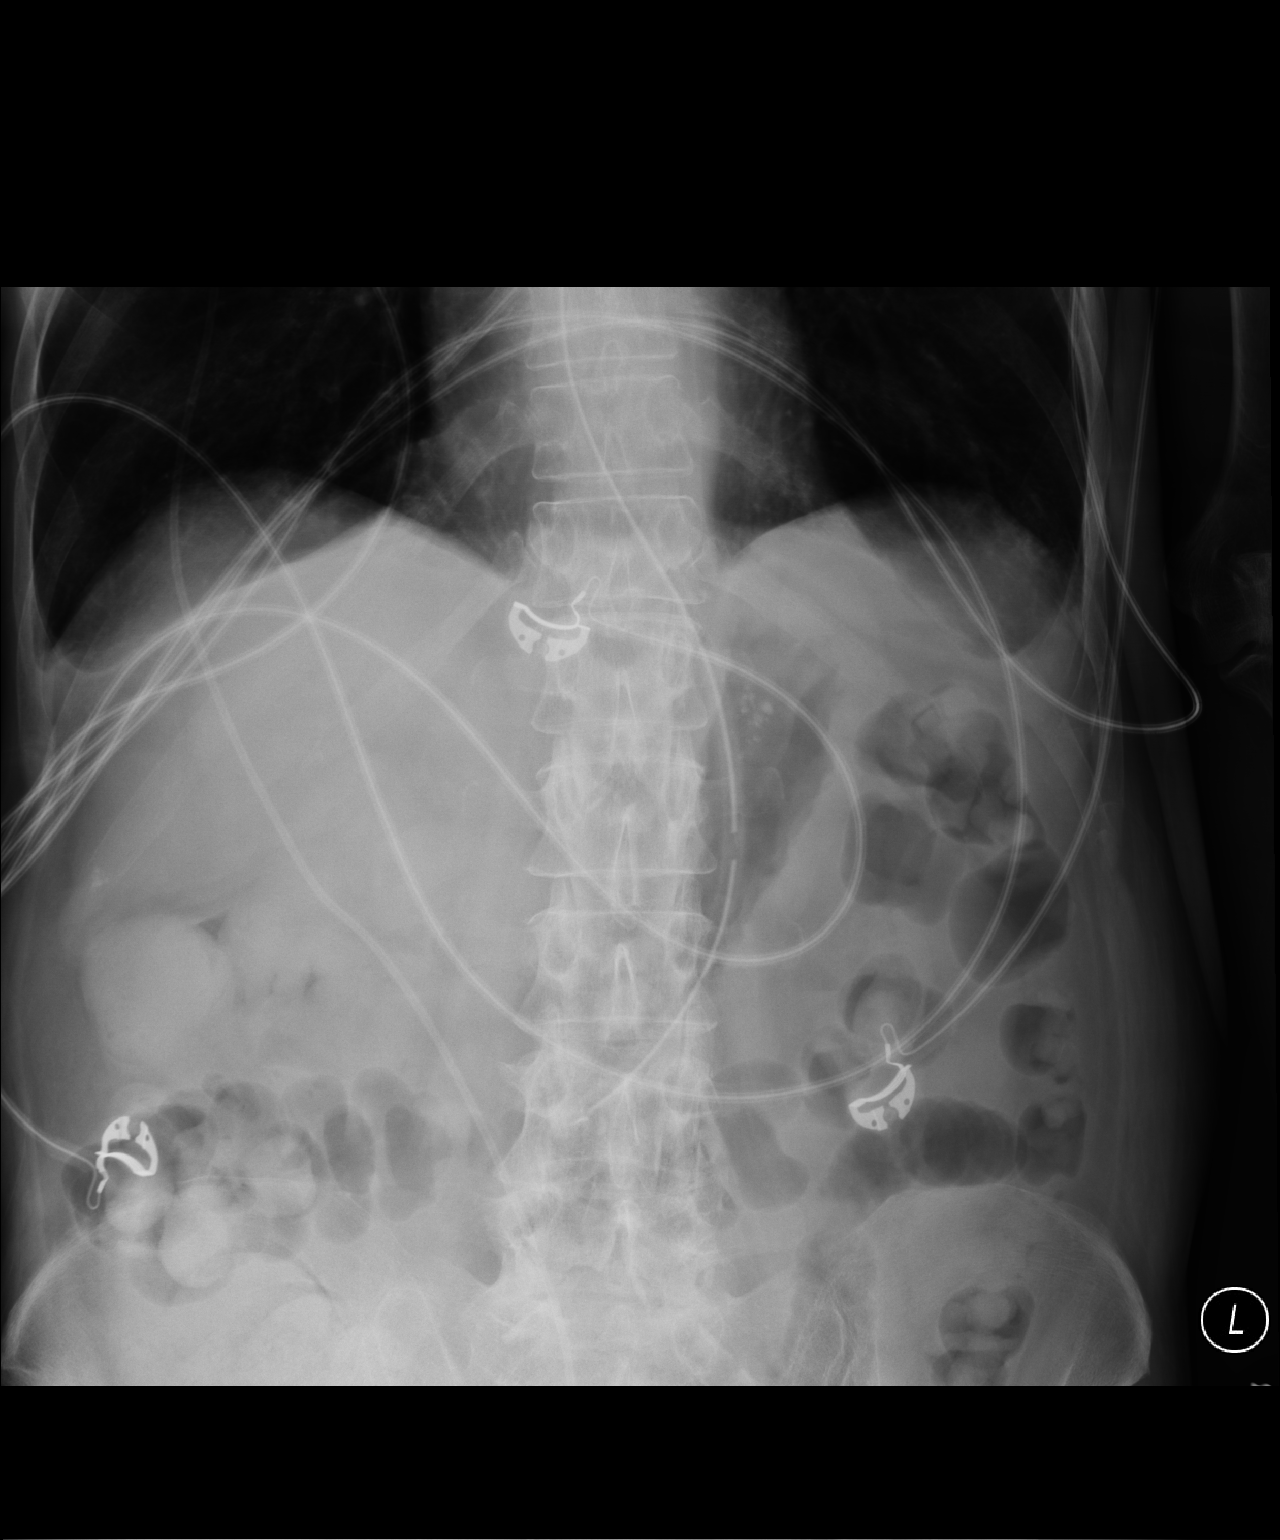

[1 of 1 positions shown; findings below may reference images not displayed]

FINDINGS: NG tube is in place with the tip in the mid to distal stomach, in
stable position since prior study. Nonobstructive bowel gas pattern.
No free air organomegaly.
IMPRESSION: NG tube tip in the mid to distal stomach in stable position. No
obstruction.

## 2016-01-20 IMAGING — CR DG CHEST 1V PORT
1 series · 1 of 1 positions shown · non-contrast
Comparison: April 25, 2015

CLINICAL DATA: Hypoxia

EXAM:
PORTABLE CHEST 1 VIEW

[AP]
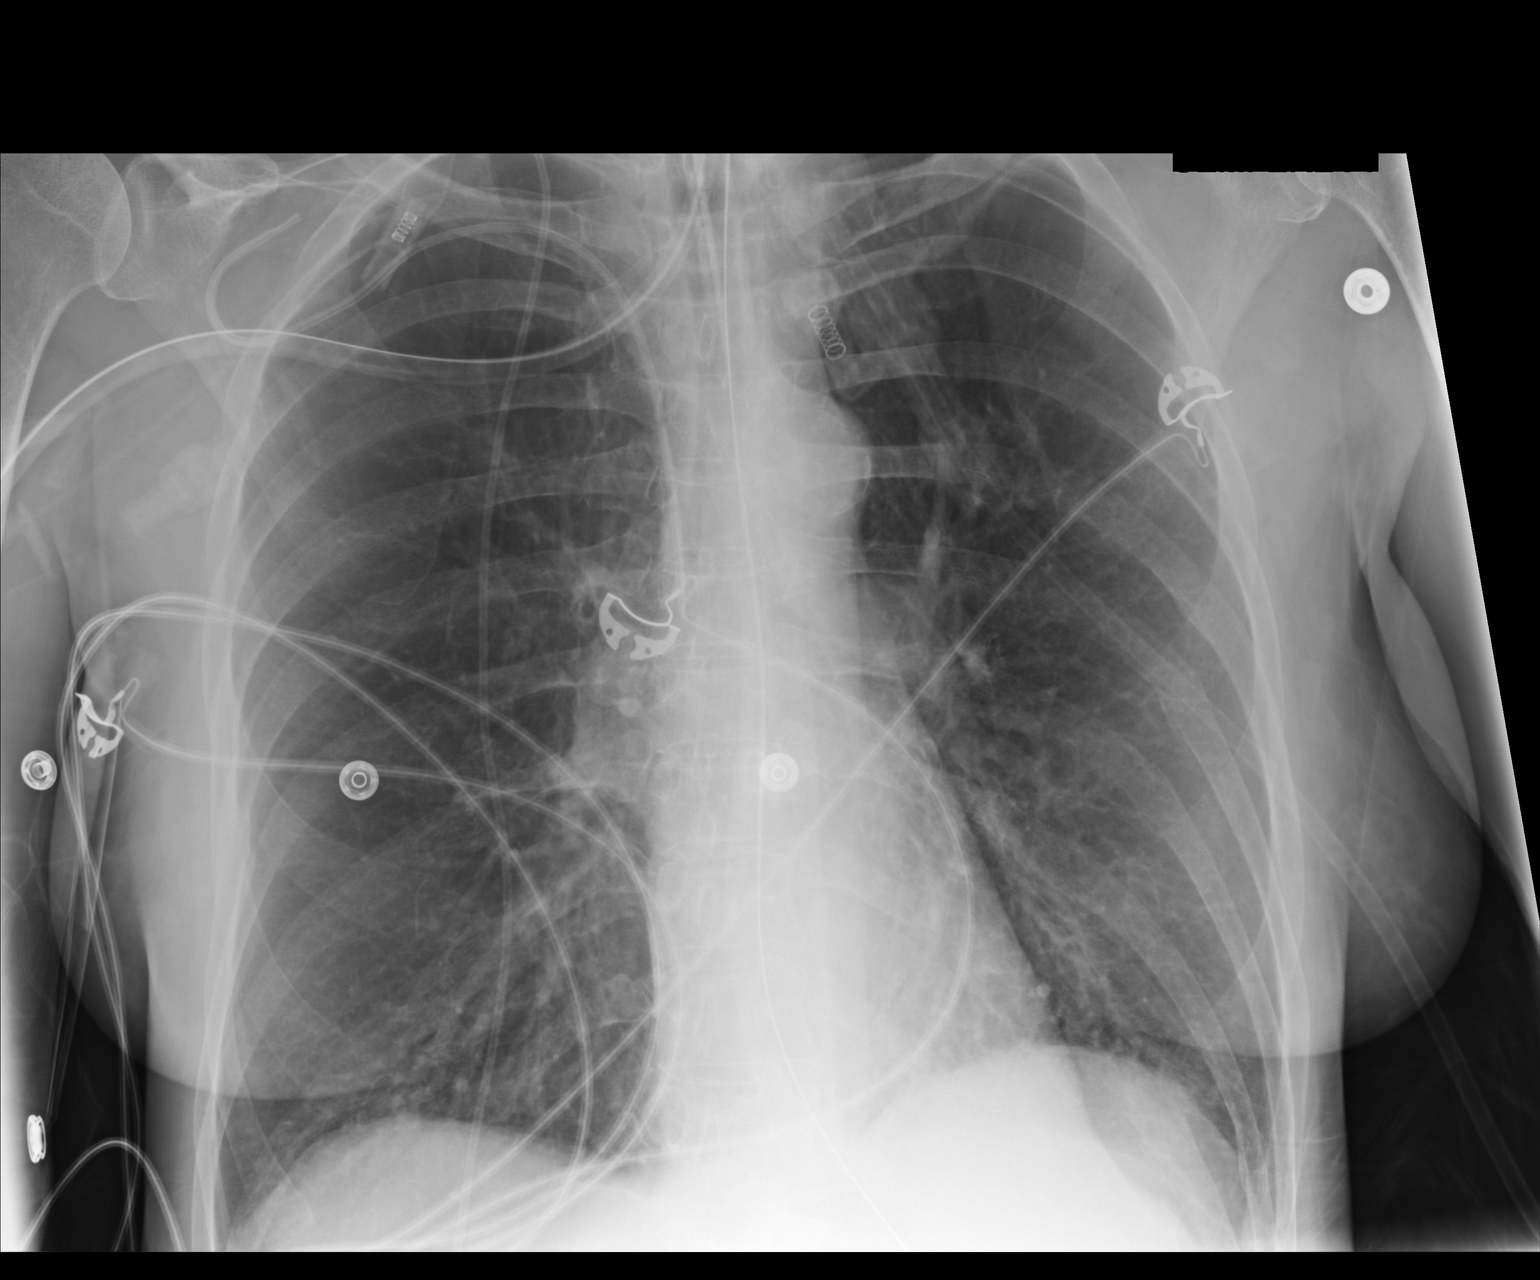

[1 of 1 positions shown; findings below may reference images not displayed]

FINDINGS: Endotracheal tube tip is 6.1 cm above the carina. Nasogastric tube
tip and side port are below the diaphragm. Central catheter tip is
in the superior vena cava. There is no demonstrable pneumothorax.
There is no appreciable edema or consolidation. The heart size and
pulmonary vascularity are normal. No adenopathy.
IMPRESSION: Tube and catheter positions as described without pneumothorax. No
edema or consolidation.

## 2016-01-21 IMAGING — CR DG CHEST 1V PORT
1 series · 1 of 1 positions shown · non-contrast
Comparison: 04/26/2015

CLINICAL DATA: ET tube

EXAM:
PORTABLE CHEST 1 VIEW

[AP]
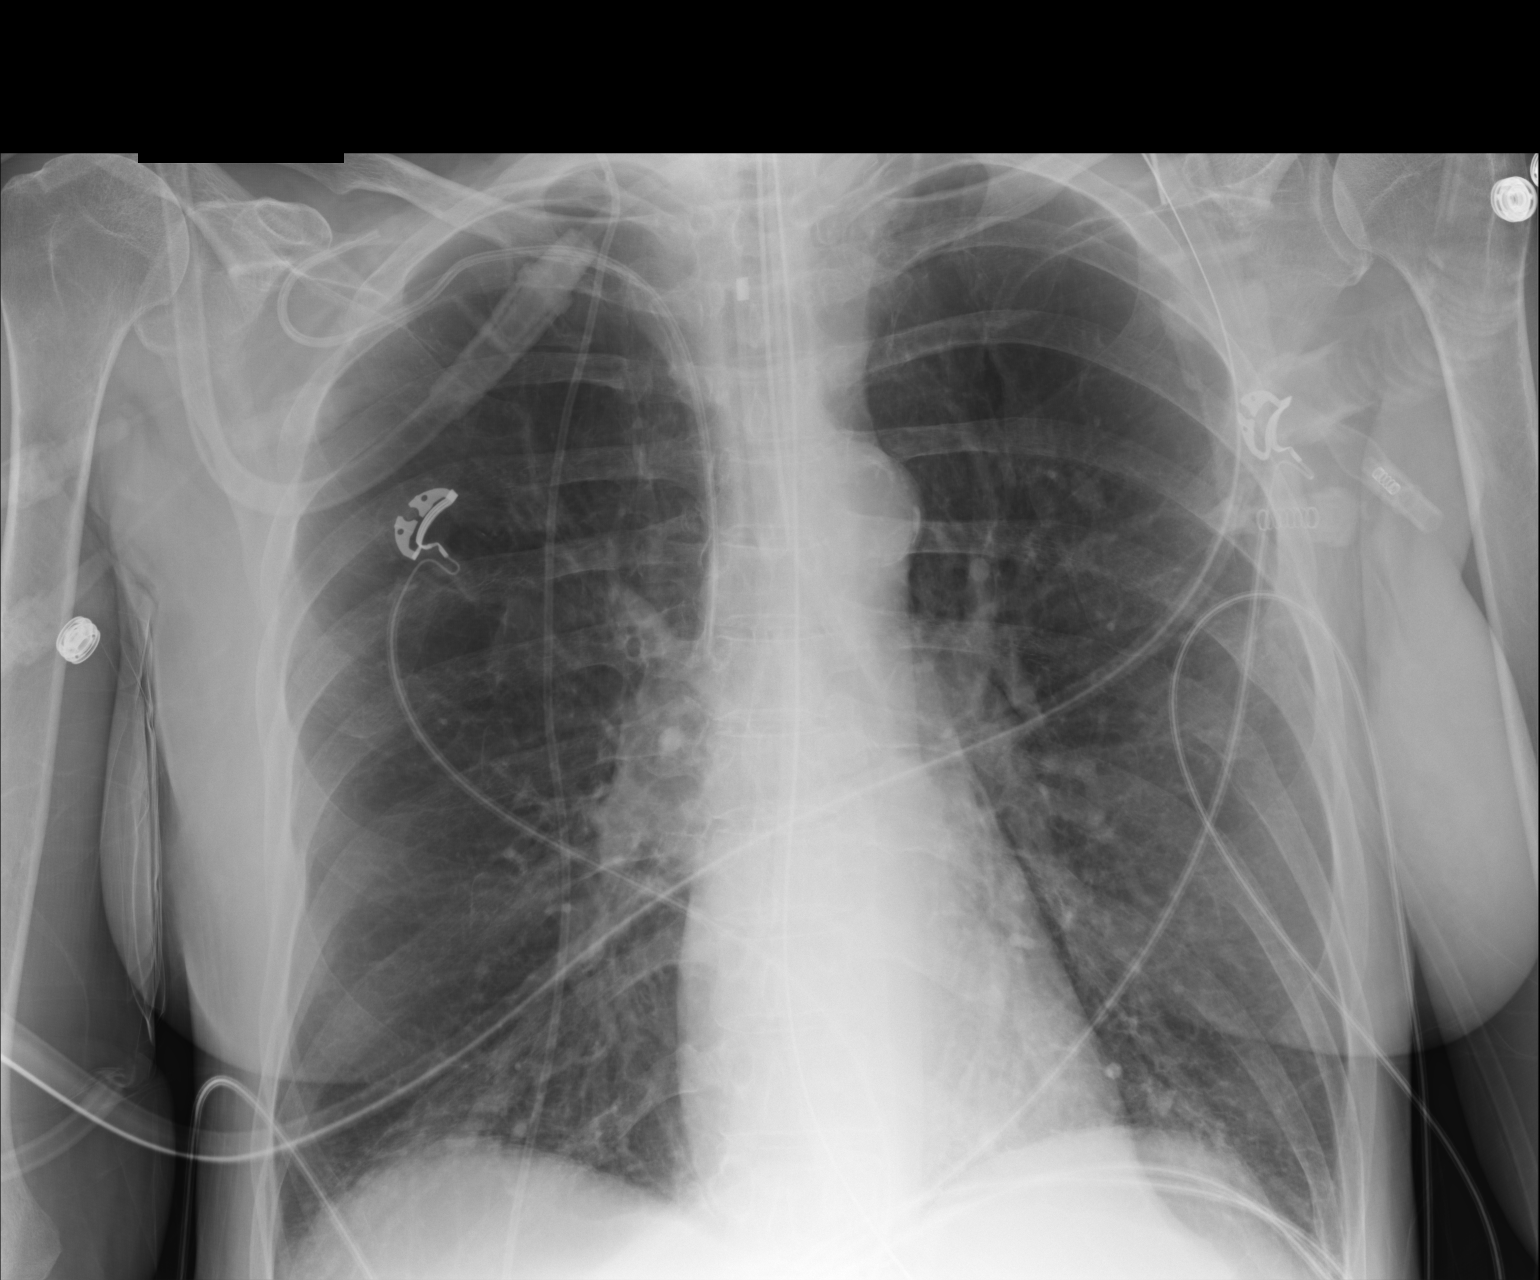

[1 of 1 positions shown; findings below may reference images not displayed]

FINDINGS: Endotracheal tube is 2.5 cm above the carina. Right central line and
NG tube are unchanged. Heart is normal size. Minimal bibasilar
atelectasis. Mild hyperinflation of the lungs. No effusions or acute
bony abnormality.
IMPRESSION: Support devices are stable.

Minimal bibasilar atelectasis.

## 2016-01-22 IMAGING — CR DG CHEST 1V PORT
2 series · 2 of 2 positions shown · non-contrast
Comparison: 04/27/2015

CLINICAL DATA: Check endotracheal tube placement

EXAM:
PORTABLE CHEST - 1 VIEW

[AP (1 of 2)]
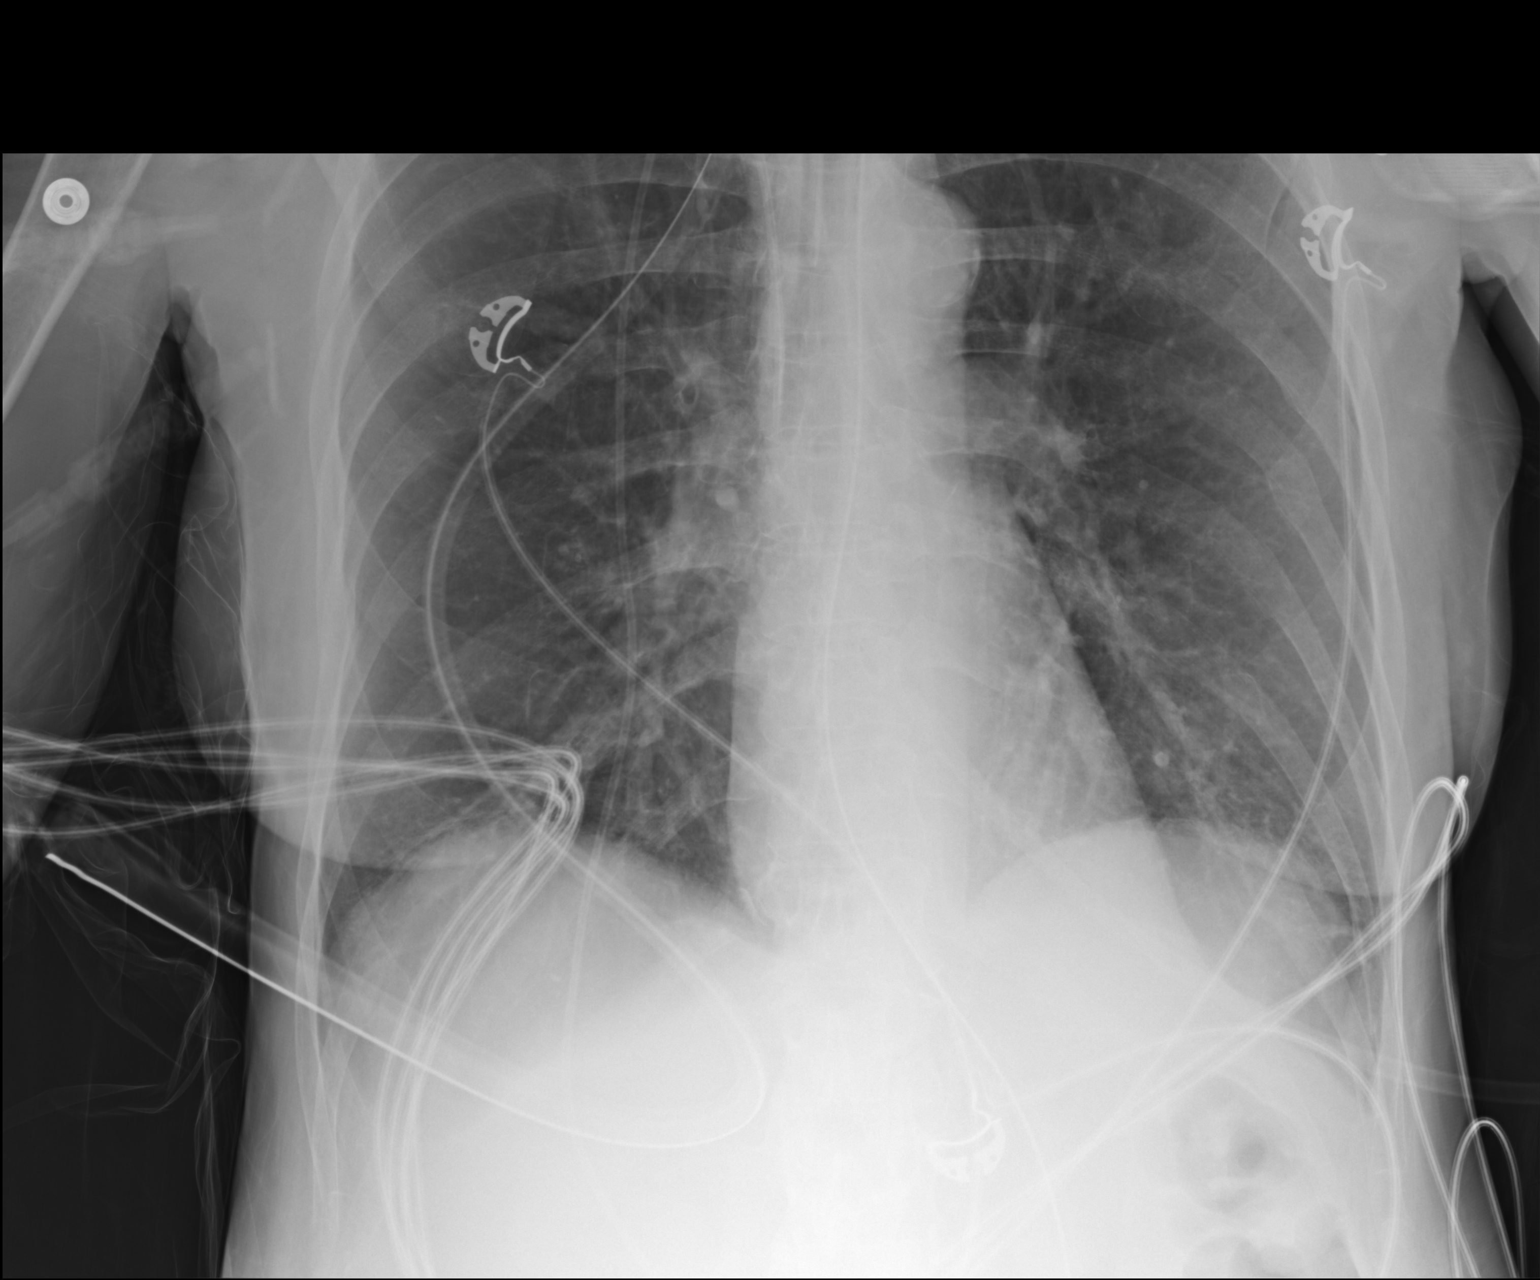

[AP (2 of 2)]
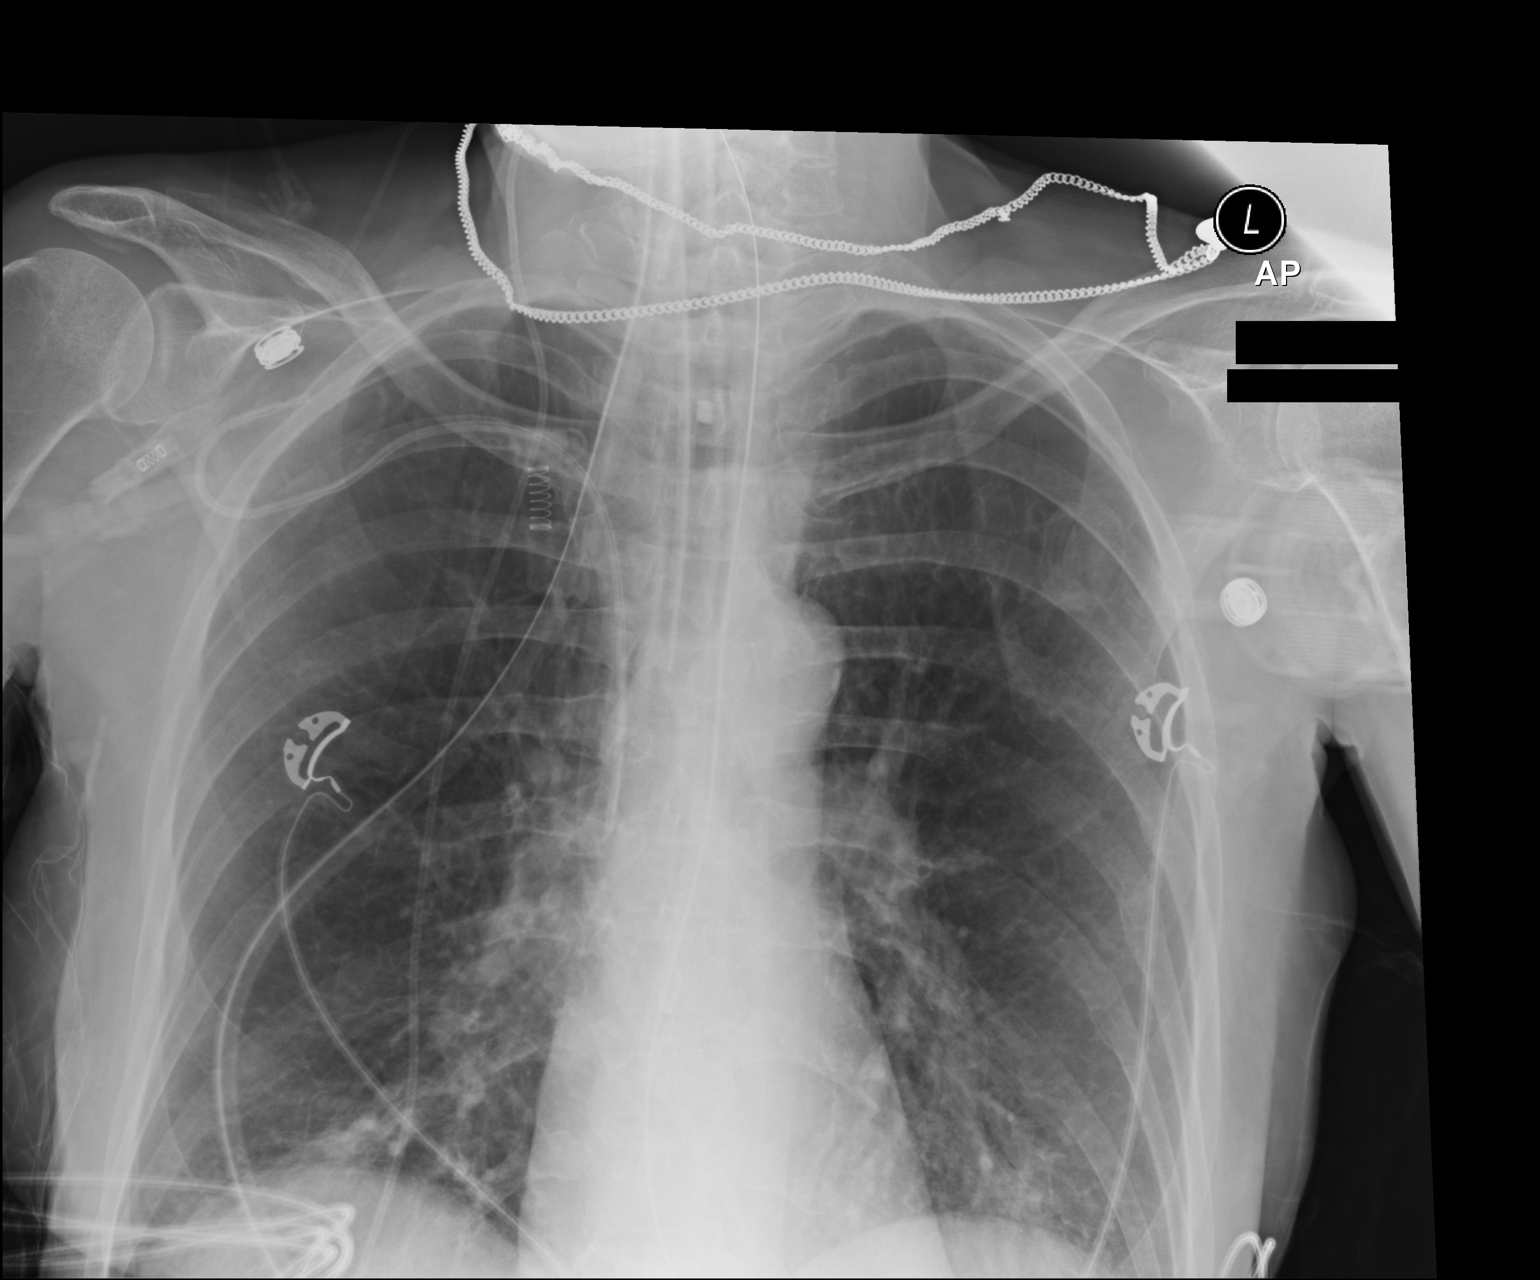

[2 of 2 positions shown; findings below may reference images not displayed]

FINDINGS: Cardiac shadow is stable. Endotracheal tube is again noted 2.7 cm
above the carina. A nasogastric catheter courses into the stomach. A
right subclavian catheter is noted in place. Shunt catheter is noted
over the right chest. The lungs are well aerated bilaterally.
Minimal atelectatic changes are again seen. No new focal abnormality
is noted.
IMPRESSION: No significant change from the prior exam.

## 2016-01-24 ENCOUNTER — Ambulatory Visit: Payer: Medicaid Other

## 2016-01-24 ENCOUNTER — Other Ambulatory Visit: Payer: Self-pay | Admitting: Gastroenterology

## 2016-01-24 DIAGNOSIS — K769 Liver disease, unspecified: Secondary | ICD-10-CM

## 2016-01-24 IMAGING — CR DG CHEST 1V PORT
1 series · 1 of 1 positions shown · non-contrast
Comparison: April 28, 2015.

CLINICAL DATA: Respiratory failure.

EXAM:
PORTABLE CHEST 1 VIEW

[AP]
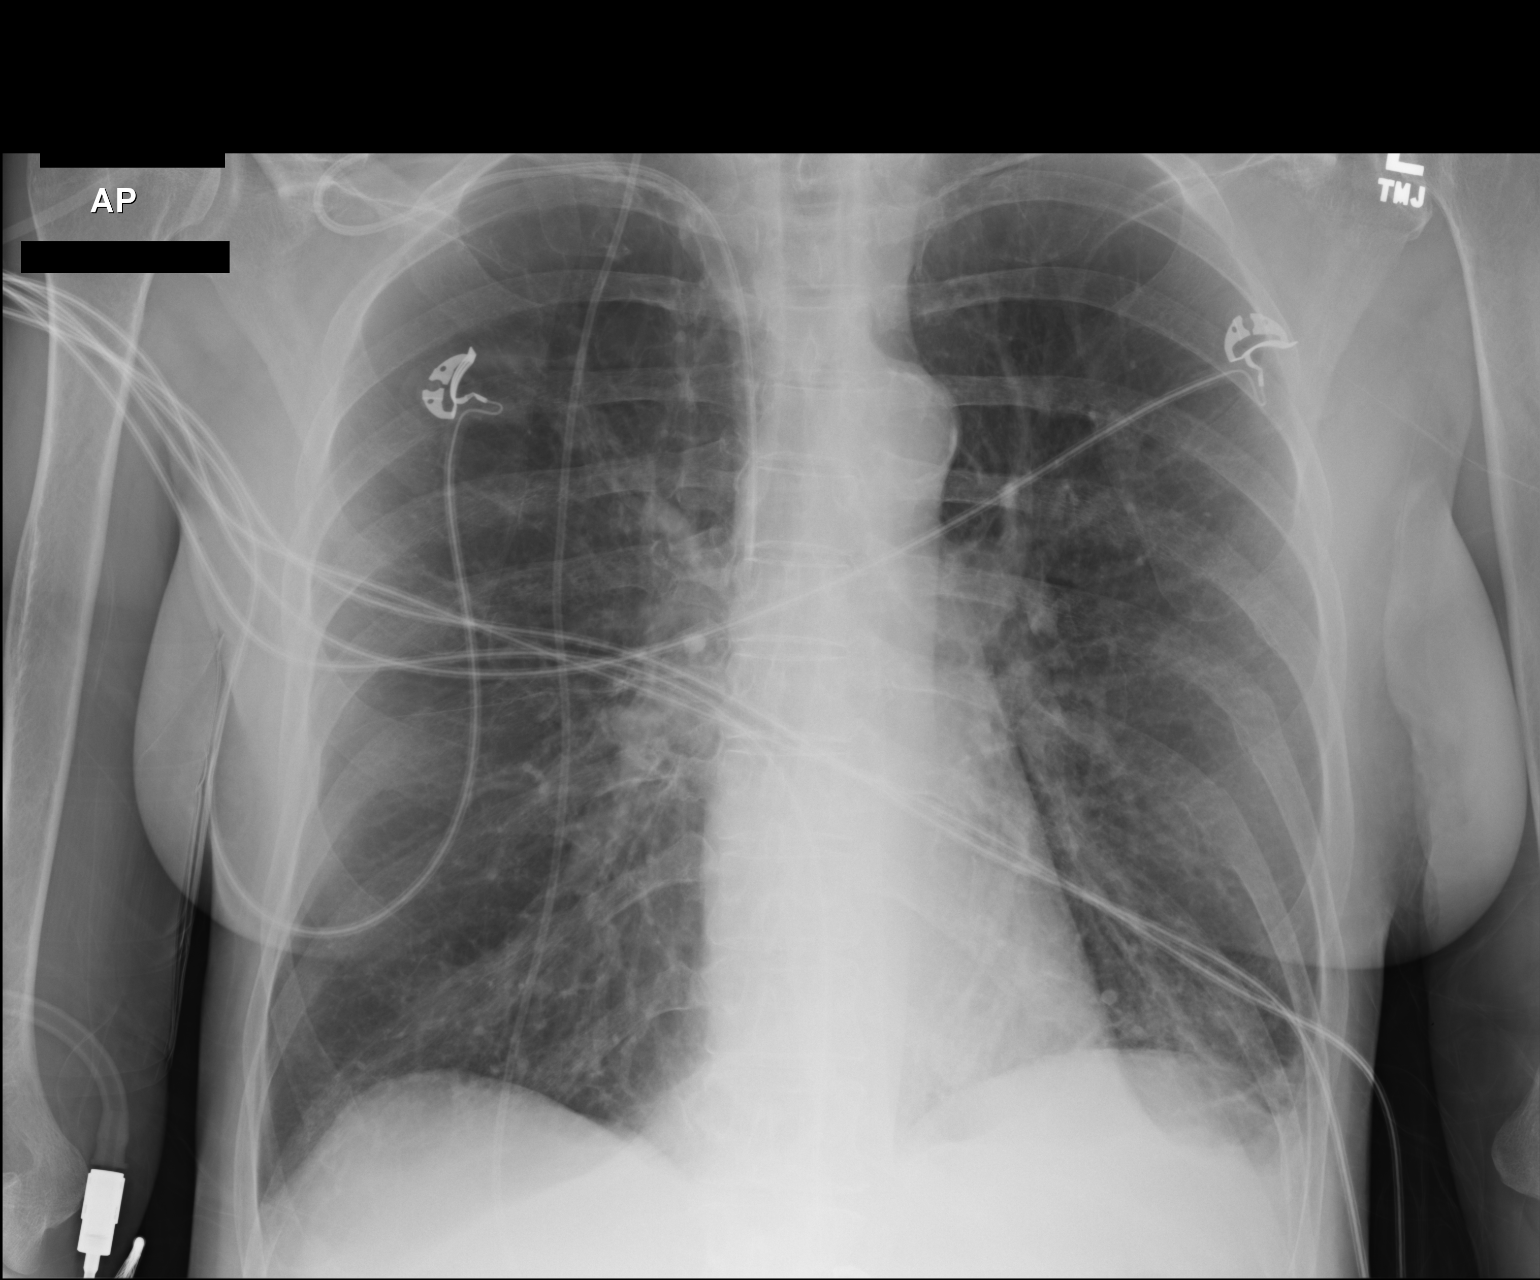

[1 of 1 positions shown; findings below may reference images not displayed]

FINDINGS: The heart size and mediastinal contours are within normal limits. No
pneumothorax is noted. Stable right subclavian catheter line with
distal tip in expected position of the SVC. Stable position of right
ventriculoperitoneal shunt. Mild left basilar opacity is noted
concerning for atelectasis with minimal associated pleural effusion.
Right lung is clear. The visualized skeletal structures are
unremarkable.
IMPRESSION: Mild left basilar opacity concerning for subsegmental atelectasis
with minimal associated pleural effusion.

## 2016-01-27 IMAGING — CR DG CHEST 1V PORT
2 series · 2 of 2 positions shown · non-contrast
Comparison: Portable chest x-ray April 30, 2015

CLINICAL DATA: Acute respiratory failure, history of [place] OPD,
intracranial hemorrhage, previous MI, current smoker.

EXAM:
PORTABLE CHEST 1 VIEW

[AP (1 of 2)]
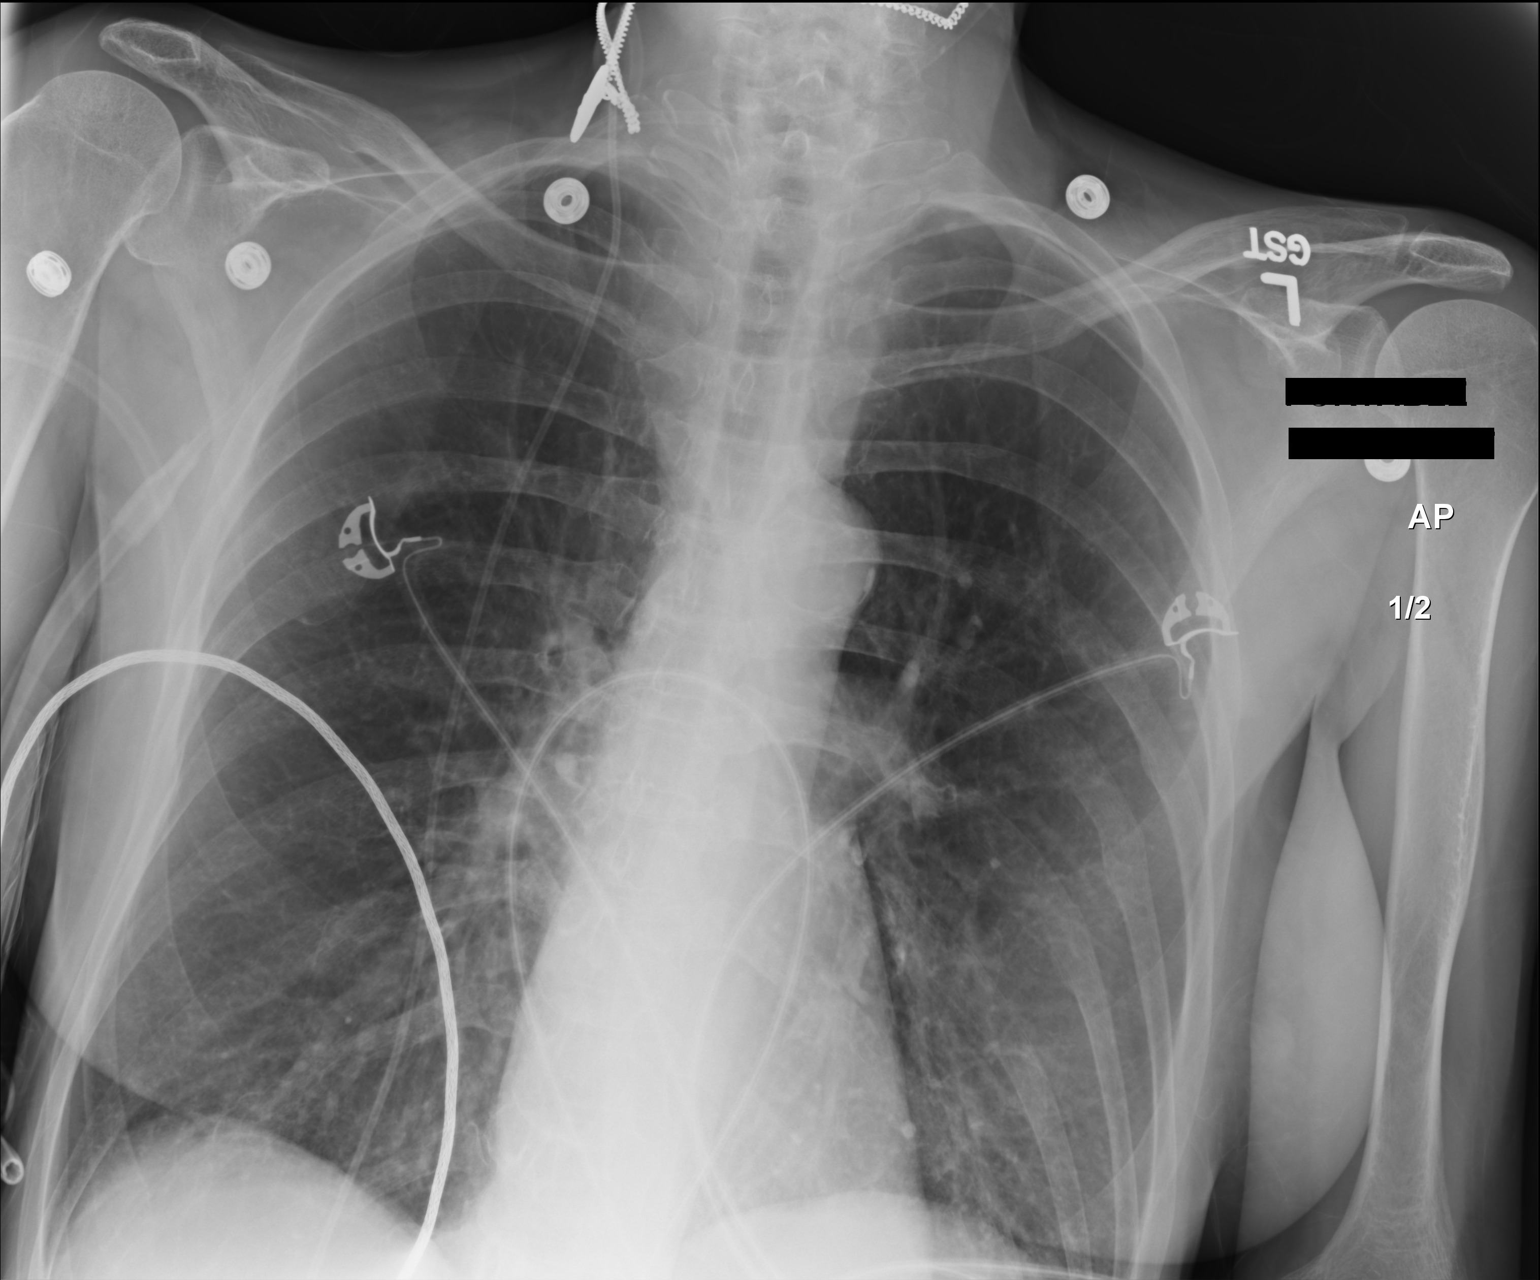

[AP (2 of 2)]
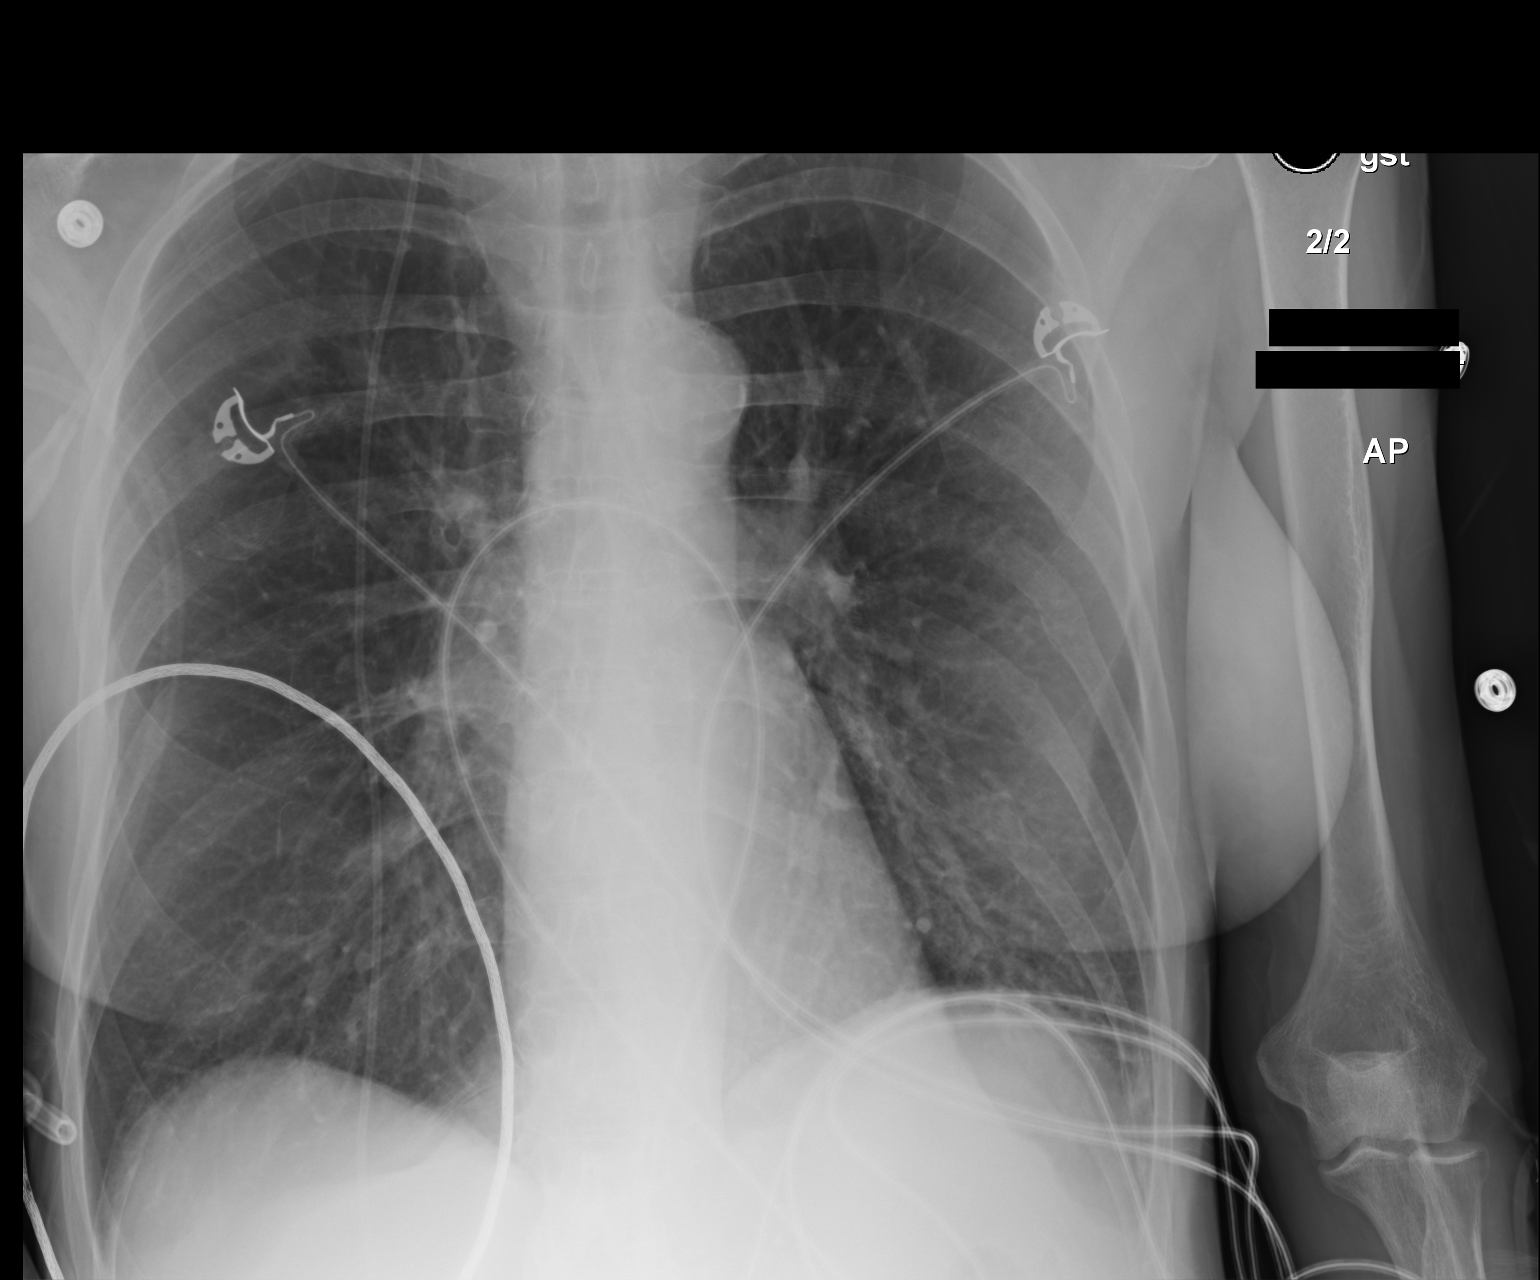

[2 of 2 positions shown; findings below may reference images not displayed]

FINDINGS: The lungs are mildly hyperinflated and clear. The minimal increased
density in the left lower hemi thorax has cleared. The heart and
pulmonary vascularity are normal. The mediastinum is normal in
width. The thoracic portion of the VP shunt tube is stable. The
right subclavian venous catheter has been removed.
IMPRESSION: COPD.  There is no acute cardiopulmonary abnormality.

## 2016-01-27 IMAGING — CR DG ABDOMEN 1V
2 series · 2 of 2 positions shown · non-contrast
Comparison: April 26, 2015.

CLINICAL DATA: Lower abdominal pain.

EXAM:
ABDOMEN - 1 VIEW

[AP (1 of 2)]
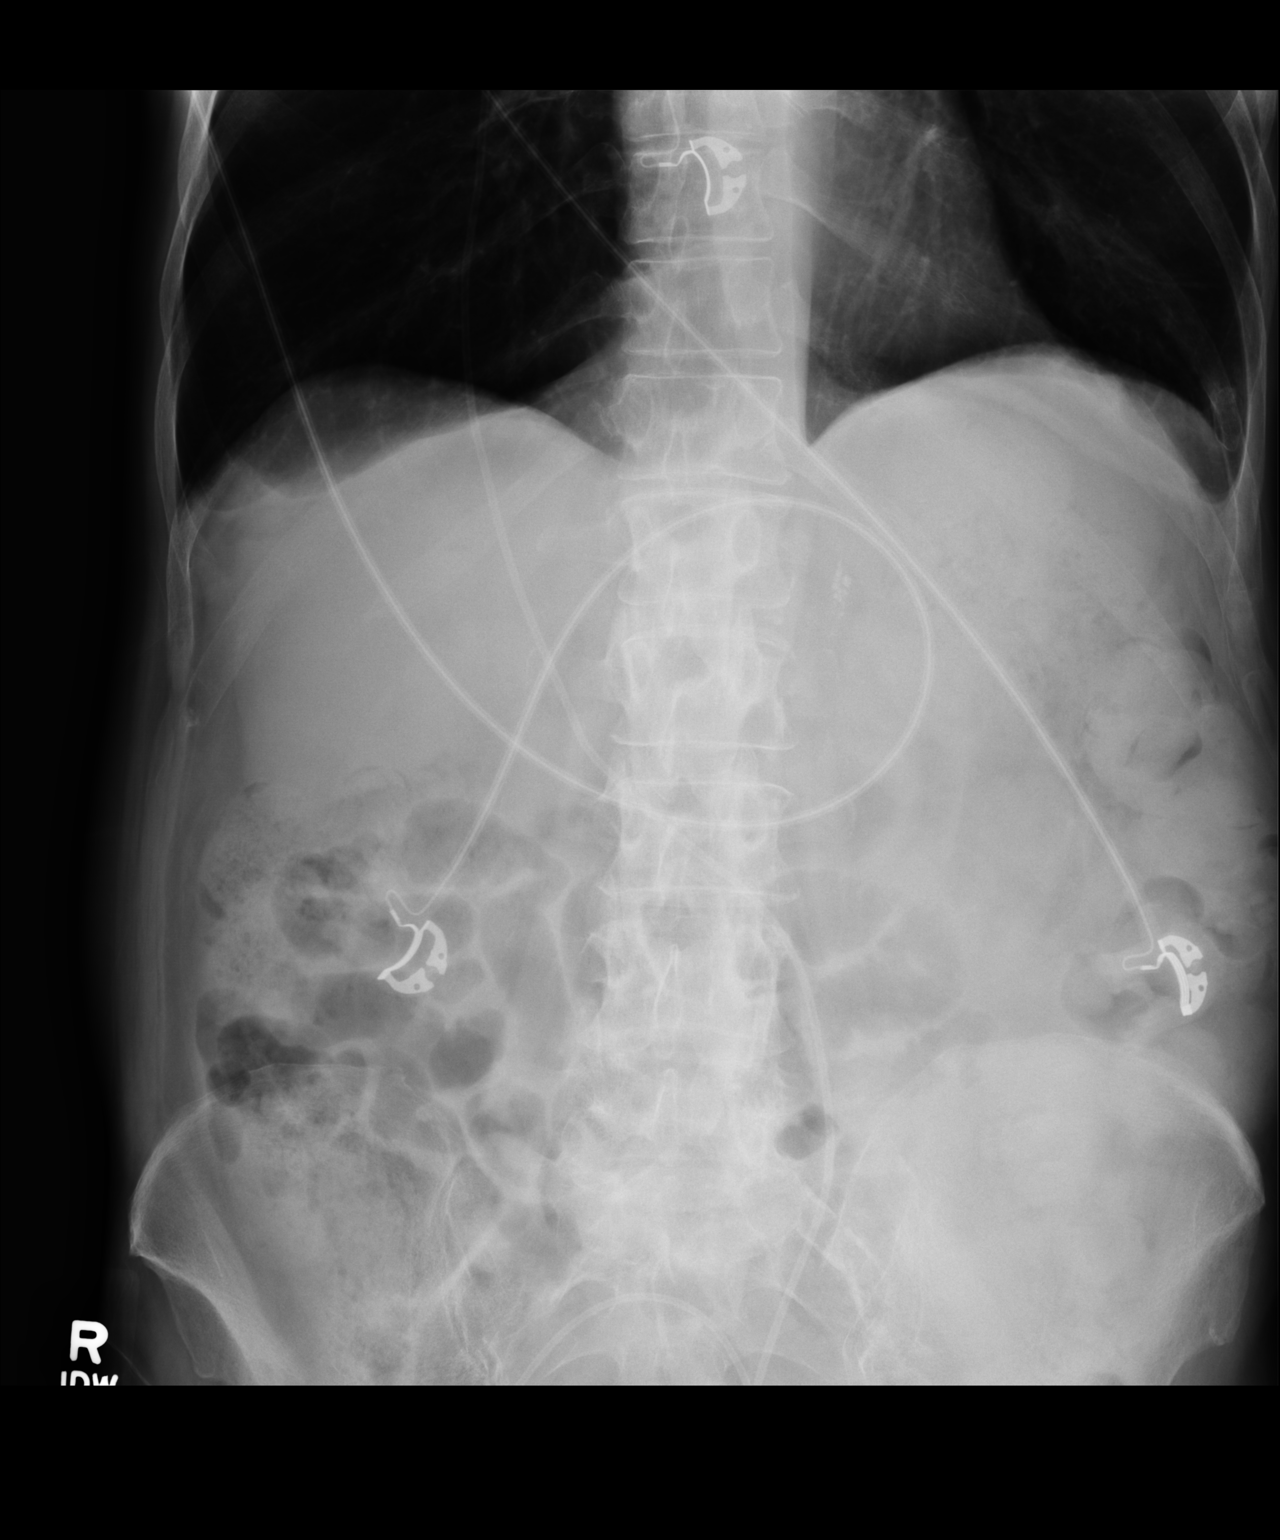

[AP (2 of 2)]
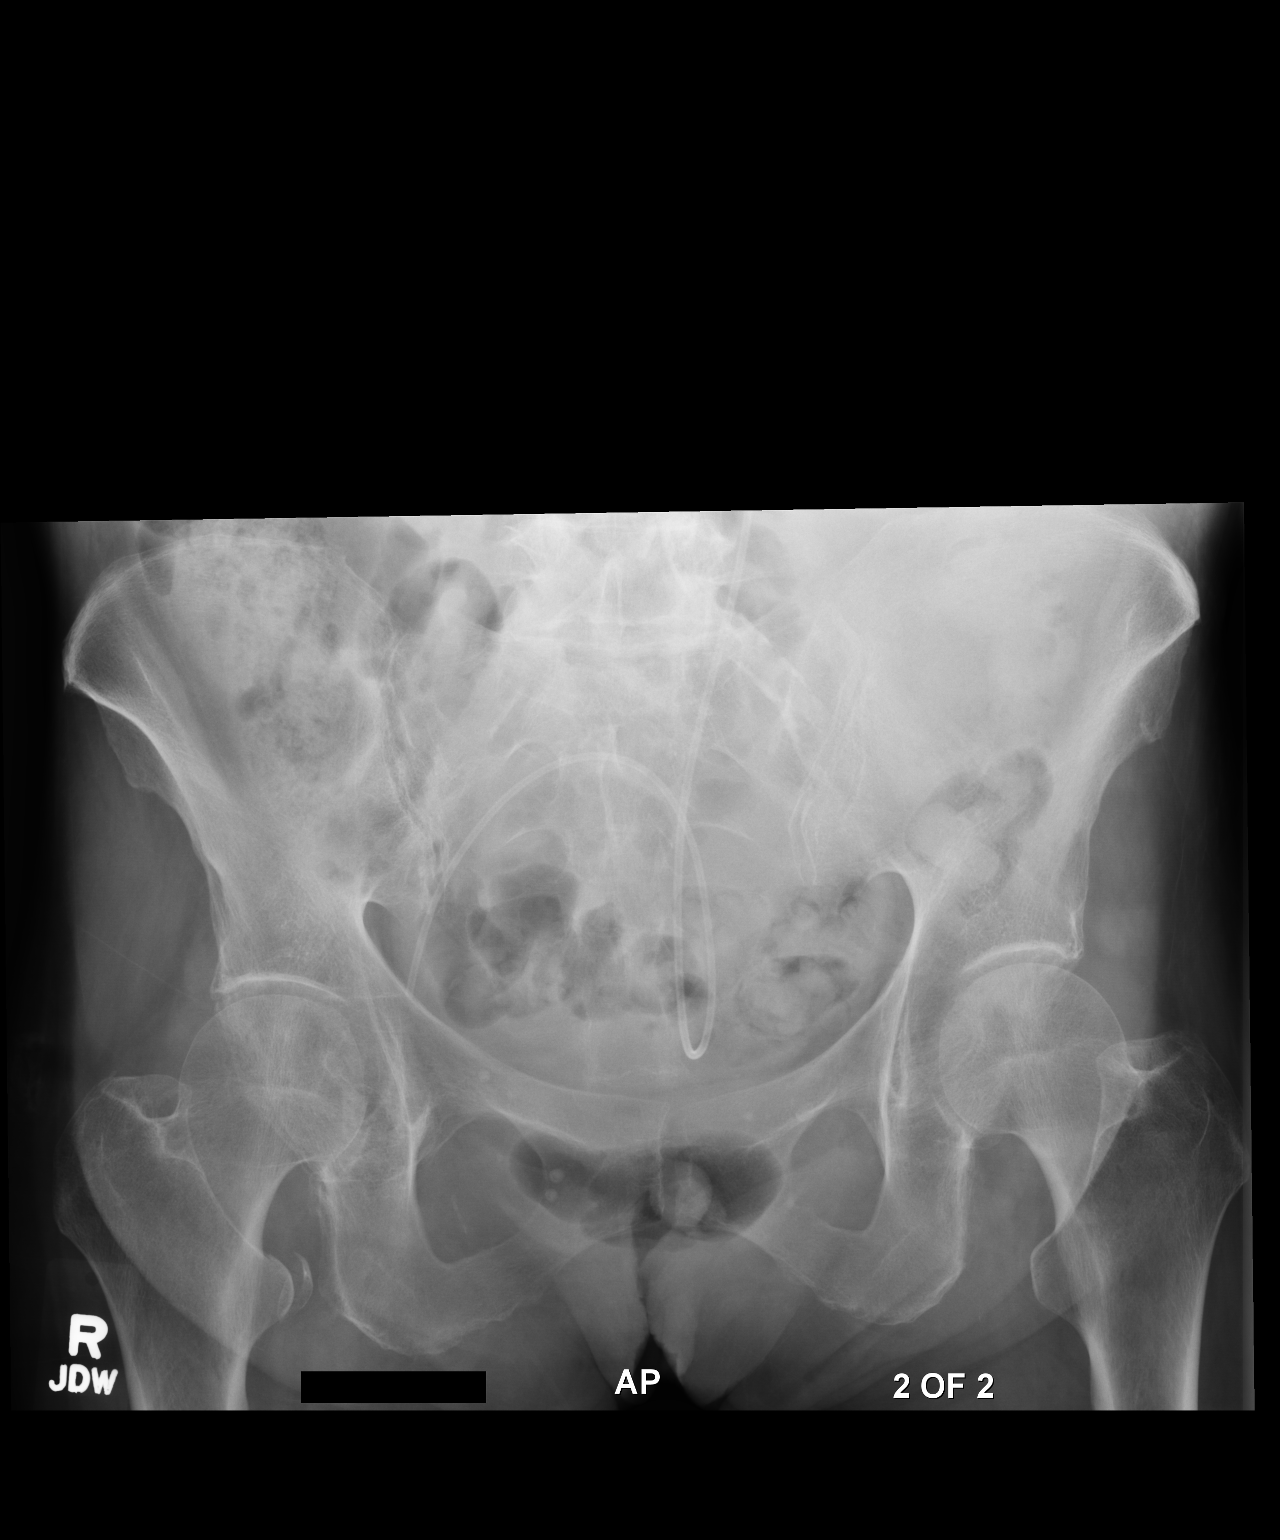

[2 of 2 positions shown; findings below may reference images not displayed]

FINDINGS: The bowel gas pattern is normal. Ventriculoperitoneal shunt is seen
with tip in pelvis. Phleboliths are noted in pelvis.
IMPRESSION: No evidence of bowel obstruction or ileus.

## 2016-02-28 ENCOUNTER — Ambulatory Visit
Admission: RE | Admit: 2016-02-28 | Discharge: 2016-02-28 | Disposition: A | Discharge status: Home / Self Care | Payer: Medicaid Other | Source: Ambulatory Visit | Attending: Gastroenterology | Admitting: Gastroenterology

## 2016-02-28 DIAGNOSIS — K769 Liver disease, unspecified: Secondary | ICD-10-CM

## 2016-02-28 HISTORY — PX: IR GENERIC HISTORICAL: IMG1180011

## 2016-02-28 NOTE — Consult Note (Signed)
Chief Complaint: Patient was seen in consultation today for a liver mass at the request of Outlaw,William  Referring Physician(s): Outlaw,William  History of Present Illness: Wanda Prince is a 60 y.o. 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 She had initial detection of nodularity in a cirrhotic liver by ultrasound in May, 2016. MRI of the abdomen on 10/13/2014 demonstrated a roughly 0.9 x 0.7 cm nodular focus of arterial hyperenhancement in the anterior subcapsular right lobe in segment 5, nearly at the juncture between right and left lobes. This was followed by further MRI in 02/24/2015 demonstrating stable size of 0.9 cm. MRI on 08/18/2015 demonstrated slight enlargement and increased conspicuity of the lesion which measured approximately 1.1 x 0.8 cm. Most recent MRI on 01/16/2016 demonstrated similar size of 1.1 x 1.0 cm. The nodule is felt to most likely represent a small hepatocellular carcinoma. However, dysplastic nodule is also in the differential.  Wanda Prince has a history of polysubstance drug abuse but has not used any recreational drugs for quite some time. She has cut down on alcohol use significantly and occasionally may have a beer. She smokes one pack per day of cigarettes. She has yet to receive any treatment for hepatitis C due to alcohol use.  Past Medical History:  Diagnosis Date  . 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 COPD (chronic obstructive pulmonary disease) ([place])   . History of acute alcoholic hepatitis   . History of alcohol abuse   . History of cocaine abuse   . History of iron deficiency   . Hx of ventricular shunt 2009  . Hypertension   . Left breast mass   . Major depressive disorder    History of  . Meningitis 2009  . Primary cancer of right upper lobe of lung ([place])  07/12/2015  . Psychiatric diagnosis    Multiple  . 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 Seizure disorder ([place])   . Suicide attempt    History of suicide attempts August 2006 and July 2006    Past Surgical History:  Procedure Laterality Date  . BRAIN SURGERY    . breast mass removal    . BREAST SURGERY    . CRANIOTOMY    . LEFT HEART CATHETERIZATION WITH CORONARY ANGIOGRAM N/A 06/06/2011   Procedure: LEFT HEART CATHETERIZATION WITH CORONARY ANGIOGRAM;  Surgeon: Burnell Blanks, MD;  Location: [facility] CATH LAB;  Service: Cardiovascular;  Laterality: N/A;    Allergies: Review of patient's allergies indicates no known allergies.  Medications: Prior to Admission medications   Medication Sig Start Date End Date Taking? Authorizing Provider  acetaminophen (TYLENOL) 500 MG tablet Take 1,000 mg by mouth every 6 (six) hours as needed for pain.   Yes Historical Provider, MD  albuterol (PROVENTIL HFA;VENTOLIN HFA) 108 (90 BASE) MCG/ACT inhaler Inhale 2 puffs into the lungs every 4 (four) hours as needed for wheezing or shortness of breath.    Yes Historical Provider, MD  Jearl Klinefelter ELLIPTA 62.5-25 MCG/INH AEPB INHALE ONE PUFF BY MOUTH ONCE DAILY 12/13/15  Yes Tanda Rockers, MD  aspirin 81 MG chewable tablet Chew 81 mg by mouth daily.   Yes Historical Provider, MD  b complex vitamins tablet Take 1 tablet by mouth 2 (two) times daily. 01/28/12  Yes Malachy Moan  [place]ville Silos, MD  cholecalciferol (VITAMIN D) 1000 UNITS tablet Take 1,000 Units by mouth daily.   Yes Historical Provider, MD  cyclobenzaprine (FLEXERIL) 5 MG tablet Take 5 mg by mouth every 8 (eight) hours as needed for muscle spasms.  11/22/13  Yes Historical Provider, MD  folic acid (FOLVITE) 1 MG tablet Take 1 mg by mouth daily.   Yes Historical Provider, MD  gabapentin (NEURONTIN) 300 MG capsule Take 300 mg by mouth at bedtime.    Yes Historical Provider, MD  levETIRAcetam (KEPPRA) 500 MG tablet Take 1 tablet (500 mg  total) by mouth 2 (two) times daily. 01/28/12  Yes Eugenie Filler, MD  metoprolol succinate (TOPROL XL) 25 MG 24 hr tablet Take 25 mg by mouth daily.  09/30/13  Yes Historical Provider, MD  Multiple Vitamin (MULITIVITAMIN WITH MINERALS) TABS Take 1 tablet by mouth daily. 06/07/11  Yes Dorian Heckle, MD  thiamine 100 MG tablet Take 1 tablet (100 mg total) by mouth daily. 06/07/11  Yes Dorian Heckle, MD  albuterol (PROVENTIL) (2.5 MG/3ML) 0.083% nebulizer solution Take 3 mLs (2.5 mg total) by nebulization every 2 (two) hours as needed for wheezing or shortness of breath. Patient not taking: Reported on 10/24/2015 05/07/15   Norman Herrlich, MD  famotidine (PEPCID) 20 MG tablet TAKE 1 TABLET BY MOUTH EVERY DAY AT BEDTIME Patient not taking: Reported on 02/28/2016 01/12/16   Tanda Rockers, MD  naltrexone (DEPADE) 50 MG tablet Take 50 mg by mouth daily.    Historical Provider, MD  pantoprazole (PROTONIX) 20 MG tablet Take 20 mg by mouth daily. 2 tabs daily    Historical Provider, MD  spironolactone (ALDACTONE) 25 MG tablet Take 25 mg by mouth daily.  12/08/13 10/24/15  Historical Provider, MD     Family History  Problem Relation Age of Onset  . Coronary artery disease Father     Social History   Social History  . Marital status: Legally Separated    Spouse name: N/A  . Number of children: N/A  . Years of education: N/A   Social History Main Topics  . Smoking status: Current Every Day Smoker    Packs/day: 1.00    Years: 40.00    Types: Cigarettes  . Smokeless tobacco: Not on file  . Alcohol use 4.8 oz/week    8 Cans of beer per week     Comment: per week  . Drug use:     Types: Cocaine     Comment: not in years  . Sexual activity: No   Other Topics Concern  . Not on file   Social History Narrative  . No narrative on file    ECOG Status: 0 - Asymptomatic  Review of Systems: A 12 point ROS discussed and pertinent positives are indicated in the HPI above.  All other systems are  negative.  Review of Systems  Constitutional: Negative.   Respiratory: Negative.   Cardiovascular: Negative.   Gastrointestinal: Negative.   Genitourinary: Negative.   Musculoskeletal: Negative.   Neurological: Negative.     Vital Signs: BP 114/78 (BP Location: Right Arm, Patient Position: Sitting, Cuff Size: Normal)   Pulse 87   Temp 98 F (36.7 C) (Oral)   Resp 14   Ht 5' 8"  (1.727 m)   Wt 108 lb (49 kg)   SpO2 96%   BMI 16.42 kg/m   Physical Exam  Constitutional: She is oriented to person, place, and time. No distress.  HENT:  Head: Normocephalic and atraumatic.  Neck: Neck supple. No JVD present. No tracheal deviation present.  Cardiovascular: Normal rate, regular rhythm and normal heart sounds.  Exam reveals no gallop and no friction rub.   No murmur heard. Pulmonary/Chest: Effort normal. No stridor. No respiratory distress. She has no wheezes. She has no rales.  Distant bilateral breath sounds.  Abdominal: Soft. She exhibits no distension and no mass. There is no tenderness. There is no rebound and no guarding.  Musculoskeletal: She exhibits no edema.  Neurological: She is alert and oriented to person, place, and time.  Skin: She is not diaphoretic.  Nursing note and vitals reviewed.     Imaging: No results found.  Labs:  CBC:  Recent Labs  05/04/15 0231 05/05/15 0255 05/06/15 0745 06/07/15 0951  WBC 15.7* 14.2* 15.8* 13.8*  HGB 13.0 13.5 13.2 15.4*  HCT 40.4 42.3 40.1 45.6  PLT 122* 113* 128* 240    COAGS:  Recent Labs  06/07/15 0952  INR 1.08  APTT 32    BMP:  Recent Labs  05/04/15 0231 05/05/15 0255 05/06/15 0410 06/07/15 0951 11/03/15 1140  NA 141 141 140 137  --   K 4.3 4.5 4.4 3.8  --   CL 98* 99* 98* 99*  --   CO2 36* 34* 36* 25  --   GLUCOSE 128* 117* 126* 146*  --   BUN 22* 19 11 9  10.3  CALCIUM 8.5* 8.8* 8.7* 8.5*  --   CREATININE <0.30* 0.36* 0.35* 0.66 0.7  GFRNONAA NOT CALCULATED >60 >60 >60  --   GFRAA NOT  CALCULATED >60 >60 >60  --     LIVER FUNCTION TESTS:  Recent Labs  06/07/15 0951  BILITOT 1.4*  AST 53*  ALT 38  ALKPHOS 136*  PROT 6.9  ALBUMIN 2.6*    TUMOR MARKERS:  Recent Labs  05/06/15 1320  AFPTM 9.4*    Assessment and Plan:  I met with Wanda Prince and her sister Vickii Chafe. We reviewed imaging findings which reveal a slowly enlarging hypervascular nodule of the liver suspicious for well-differentiated/low-grade hepatocellular carcinoma based on avid arterial phase enhancement. The degree of enhancement would be atypical for a dysplastic nodule. There are no other lesions in the liver and no evidence of metastatic disease in the abdomen.  Options for further management of the hepatic lesion were discussed including continued surveillance, biopsy and percutaneous thermal ablation. Wanda Prince would like to have the lesion treated with thermal ablation if there is any chance of malignancy. She currently has bilateral upper lobe pulmonary nodules that are being followed by Dr. Melvyn Novas. There is suspicion that these may be infectious/inflammatory in nature.  Given her history of significant chronic lung disease including hospitalizations for respiratory failure, I would like her to follow-up with Dr. Melvyn Novas before placing her under general anesthesia for ablation.  There is a previous history of cardiac catheterization in 2013 at the time of a hospitalization. This showed mild atherosclerosis with no significant obstructive coronary disease. She has a history of non-STEMI.  Ejection fraction was depressed at that time but later returned to normal on a 2015 echocardiogram.  She has no symptoms of chest pain and is not followed by a cardiologist. It still may help to obtain cardiac clearance given this history and we will refer her to [facility].  Ms. Pellot would like to wait until January to have the liver treated. We will start the approval and scheduling process.  Thank you for this  interesting consult.  I greatly enjoyed meeting Wanda Prince and look forward to participating in their care.  A copy of this report was sent to the requesting provider on this date.  Electronically SignedAletta Edouard T 02/28/2016, 2:49 PM   I spent a total of 40 Minutes in face to face in clinical consultation, greater than 50% of which was counseling/coordinating care for liver tumor.

## 2016-03-02 IMAGING — CT CT ABD-PELV W/ CM
2 of 5 series · 10 of 46 positions shown, 11 images · IV contrast (Iodine)
Comparison: None.

CLINICAL DATA: Lower abdominal pain with bloody stools beginning
yesterday. No history of bowel disease or surgery. No history of
cancer. Denies vomiting.

EXAM:
CT ABDOMEN AND PELVIS WITH CONTRAST
TECHNIQUE: Multidetector CT imaging of the abdomen and pelvis was performed
using the standard protocol following bolus administration of
intravenous contrast.
CONTRAST:  100mL OMNIPAQUE IOHEXOL 300 MG/ML  SOLN

[Series 201: routine, idose (2) · axial · 0.80mm/px · z∈[-475,-140]mm · 7 of 89 slices shown, 8 images]
[im 11/89  soft-tissue]
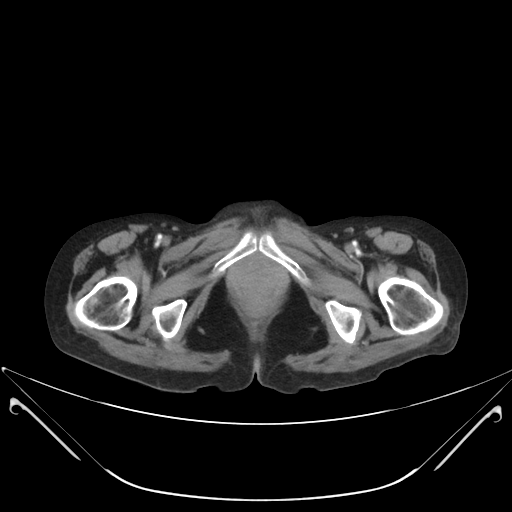
[im 11/89  bone]
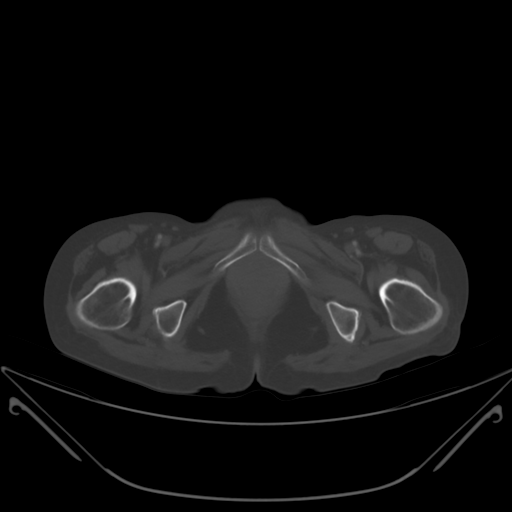
[im 21/89  soft-tissue]
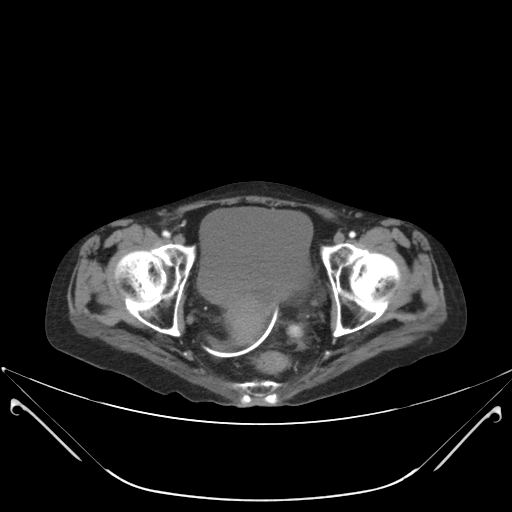
[im 32/89  soft-tissue]
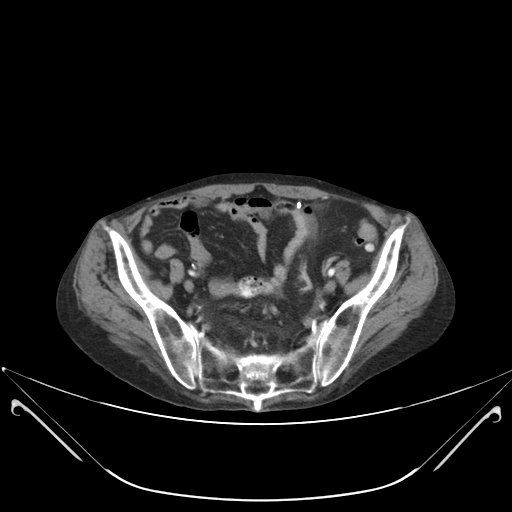
[im 47/89  soft-tissue]
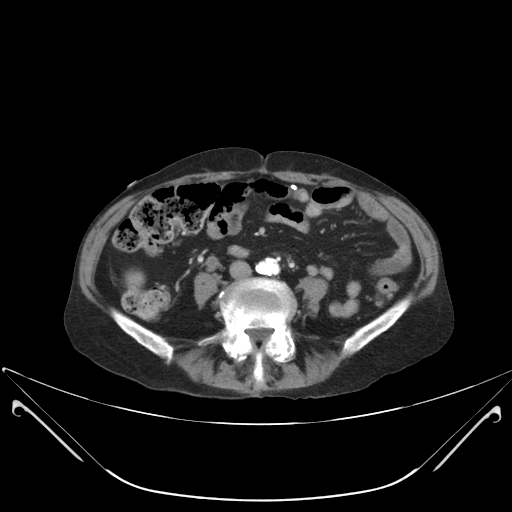
[im 57/89  soft-tissue]
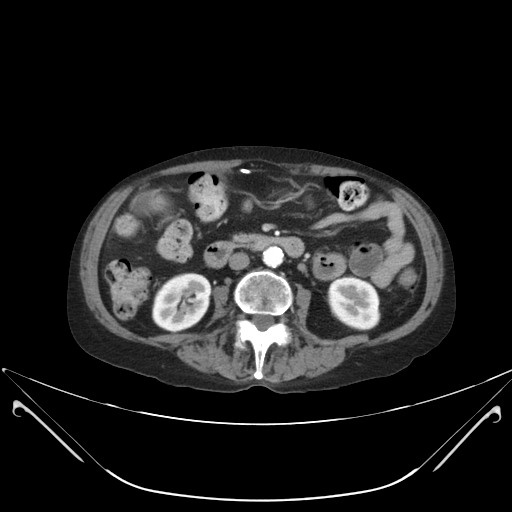
[im 68/89  soft-tissue]
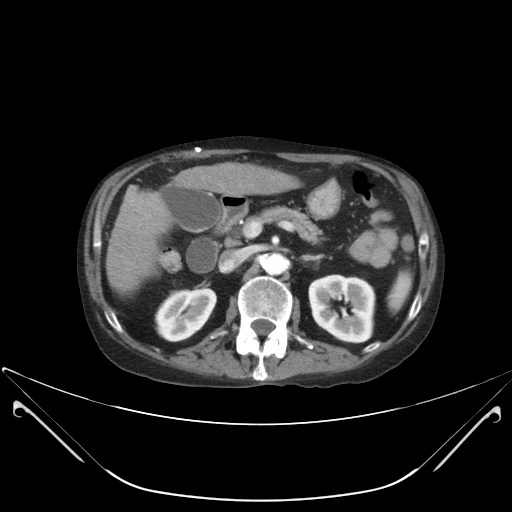
[im 78/89  soft-tissue]
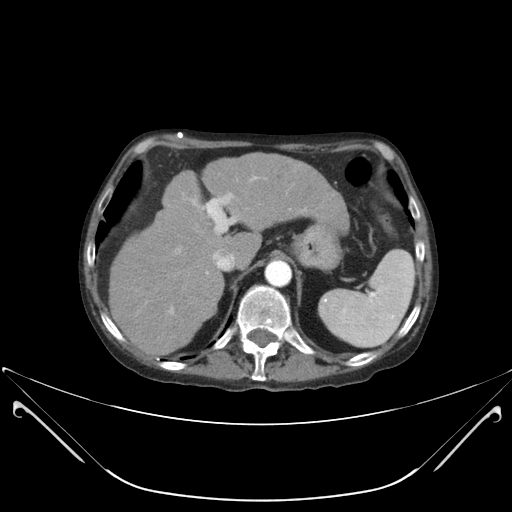

[Series 203: coronals, idose (2) · coronal · 0.45mm/px · 3 of 107 slices shown]
[im 36/107  soft-tissue]
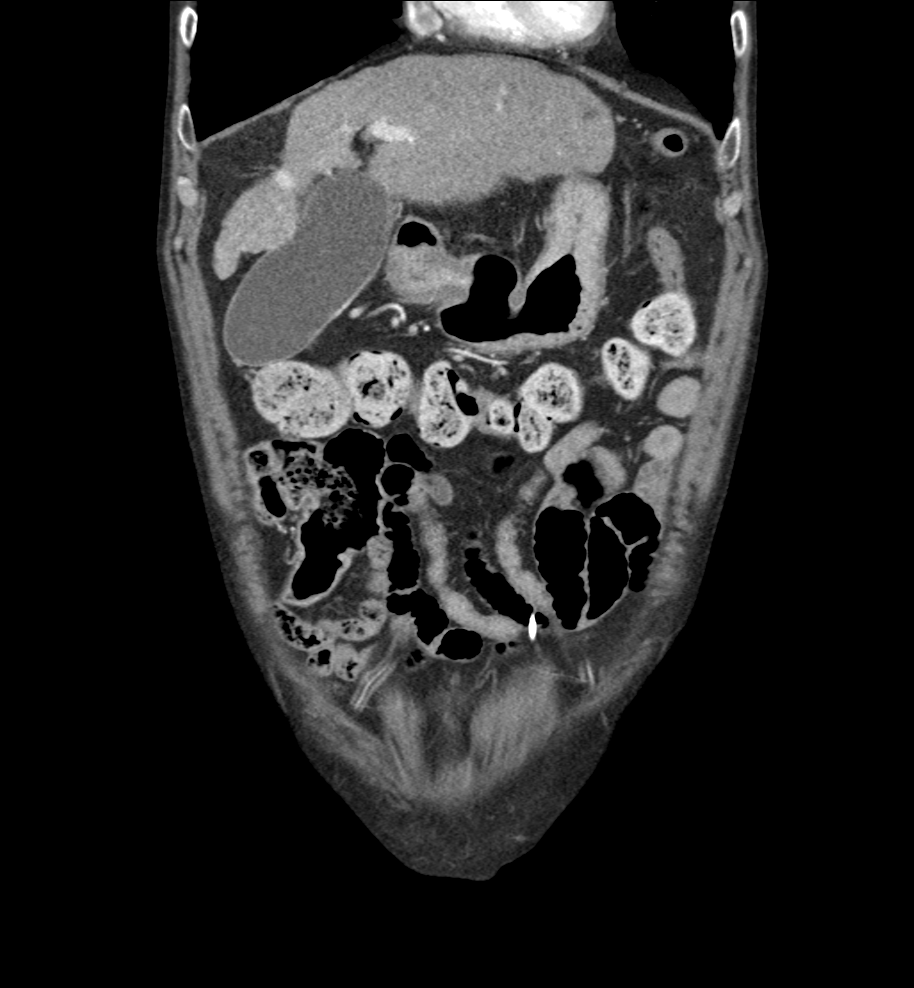
[im 48/107  soft-tissue]
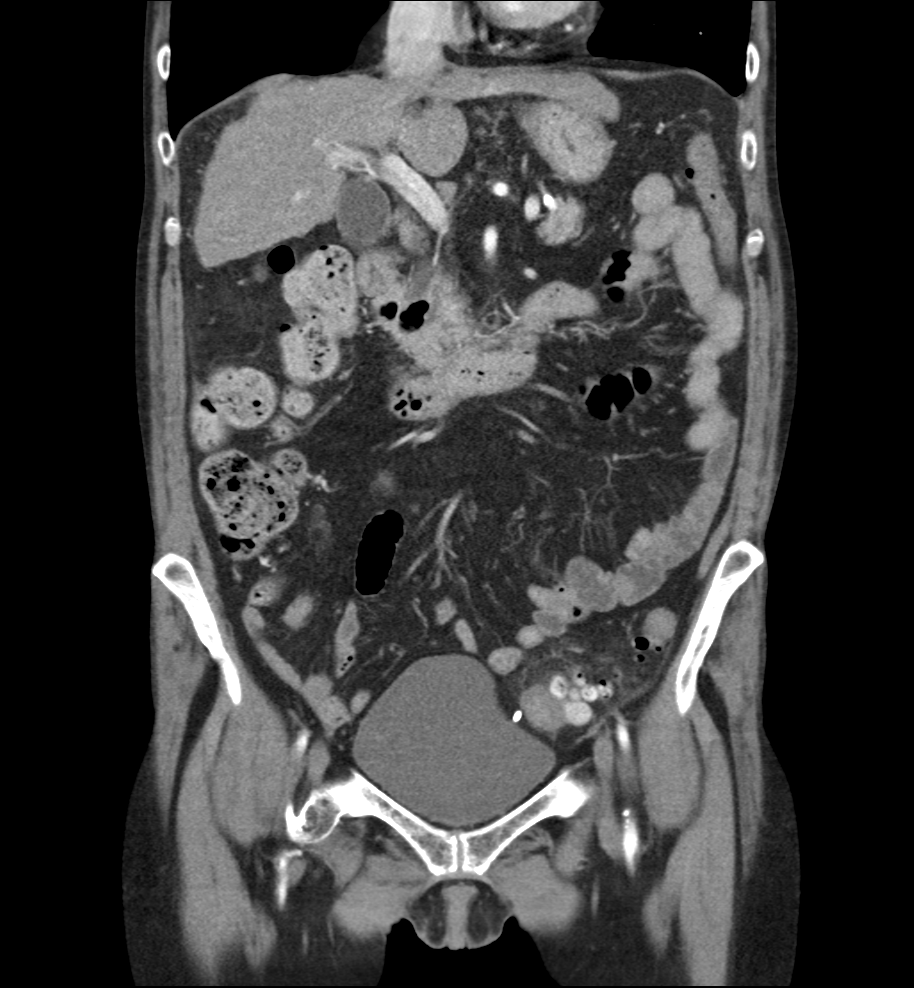
[im 59/107  soft-tissue]
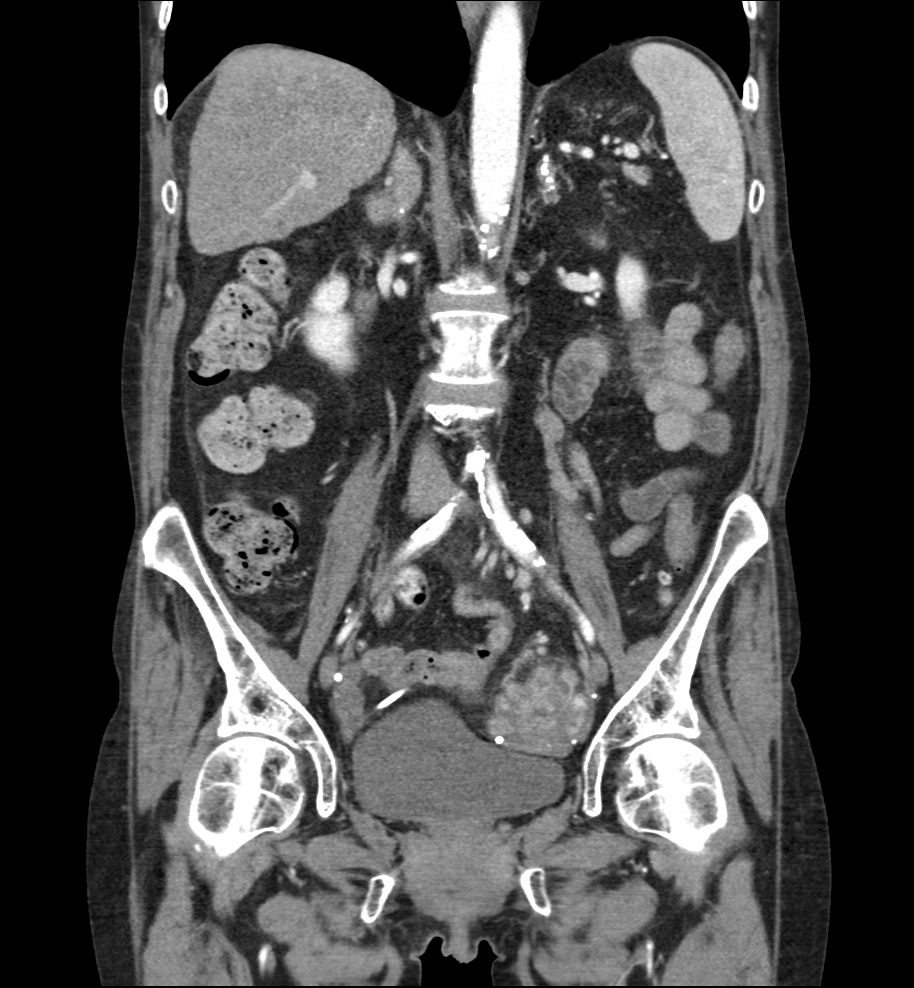

[10 of 46 positions shown; findings below may reference images not displayed]

FINDINGS: Lower chest: 2 cm nodular consolidation at the left lung base
posteriorly. Lung bases otherwise clear.

Hepatobiliary: Liver is cirrhotic with diffusely nodular contours.
No suspicious mass or lesion within the liver.

Gallbladder is significantly distended but there are no inflammatory
changes to suggest acute cholecystitis. Common bile duct is mildly
prominent but there is no intrahepatic bile duct dilatation or bile
duct stone seen.

Pancreas: No mass, inflammatory changes, or other significant
abnormality. No pancreatic duct dilatation.

Spleen: Within normal limits in size and appearance.

Adrenals/Urinary Tract: Left adrenal calcifications. Right adrenal
gland is unremarkable. Kidneys appear normal without mass, cyst,
stone or hydronephrosis. No ureteral or bladder calculi identified.

Stomach/Bowel: Fairly marked thickening of the walls of the sigmoid
colon, approximately 15 cm segment of the sigmoid colon. Fairly
extensive diverticulosis noted throughout the sigmoid colon and
lower descending colon. More proximal colon is normal in caliber.
Small bowel is normal in caliber and configuration throughout.
Appendix is normal.

Vascular/Lymphatic: Fairly extensive atherosclerotic calcifications
of the normal- caliber abdominal aorta and aortic branch vessels. No
acute-appearing vascular abnormality.

Scattered small lymph nodes. No pathologically enlarged lymph nodes
seen within the abdomen or pelvis.

Reproductive: No mass or other significant abnormality.

Other: Small amount of free fluid within the lower pelvis. This is
likely related to patient's intraperitoneal catheter, presumably
ventriculoperitoneal shunt. No abscess collection identified. No
free intraperitoneal air.

Musculoskeletal: Degenerative changes of the lumbar spine, moderate
in degree. No acute osseous abnormality. Superficial soft tissues
are unremarkable.
IMPRESSION: 1. Fairly marked thickening of the walls of a 15 cm segment of the
mid sigmoid colon. This may be due to diverticulitis, as there is
extensive diverticulosis of the sigmoid colon, but given the extent
of involvement this is more likely a colitis of infectious or
inflammatory nature. Recommend follow-up CT at some point to ensure
resolution and exclude the less likely possibility of this
representing a colon cancer.
2. No associated bowel obstruction.
3. Cirrhotic liver.
4. Focal nodular consolidation within the left lower lobe
posteriorly, measuring 2 cm greatest dimension, new compared to a
recent chest CT 05/05/2015 suggesting nodular atelectasis. Please
note that a 15 mm spiculated cavitary mass was identified within the
right upper lobe on the recent chest CT, concerning for carcinoma of
the lung.

## 2016-03-08 IMAGING — DX DG FOOT COMPLETE 3+V*R*
3 series · 3 of 3 positions shown · non-contrast
Comparison: None.

CLINICAL DATA: Hit medial aspect of foot against solid object

EXAM:
RIGHT FOOT COMPLETE - 3+ VIEW

[x foot ap right]
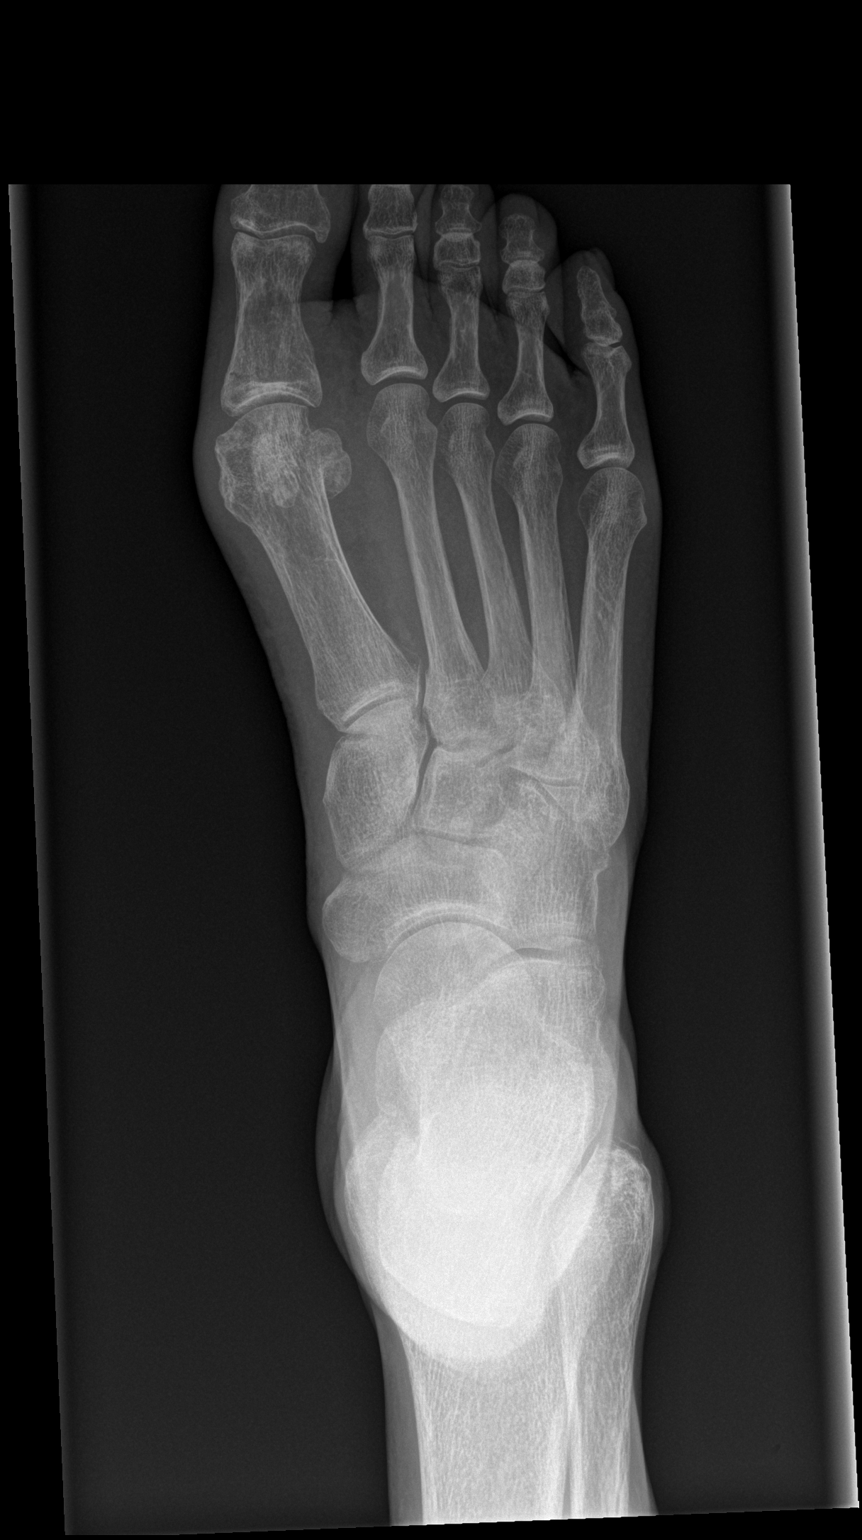

[x foot obl right]
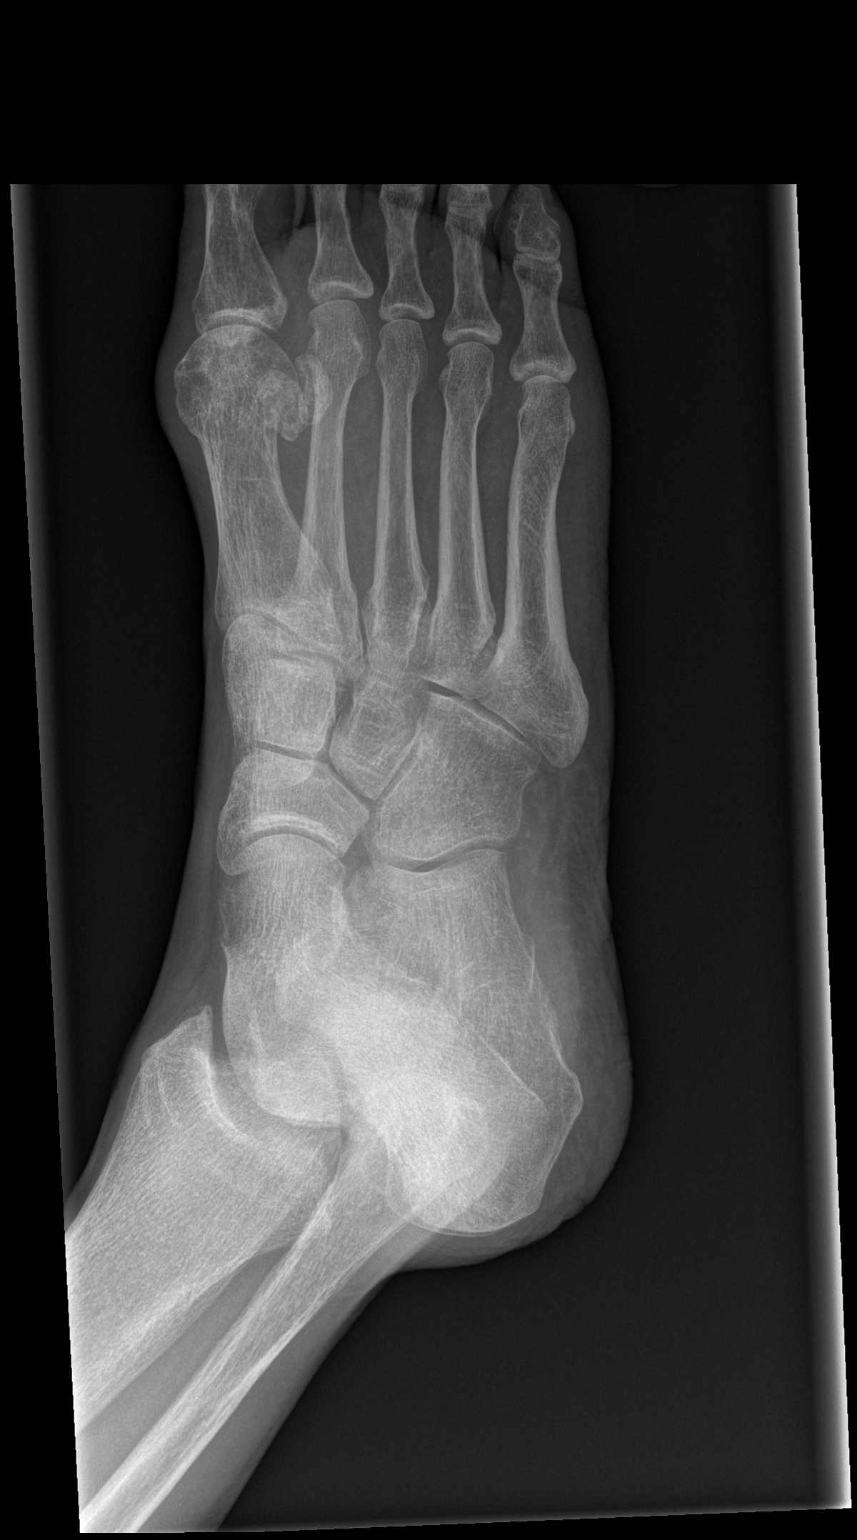

[x foot lat right]
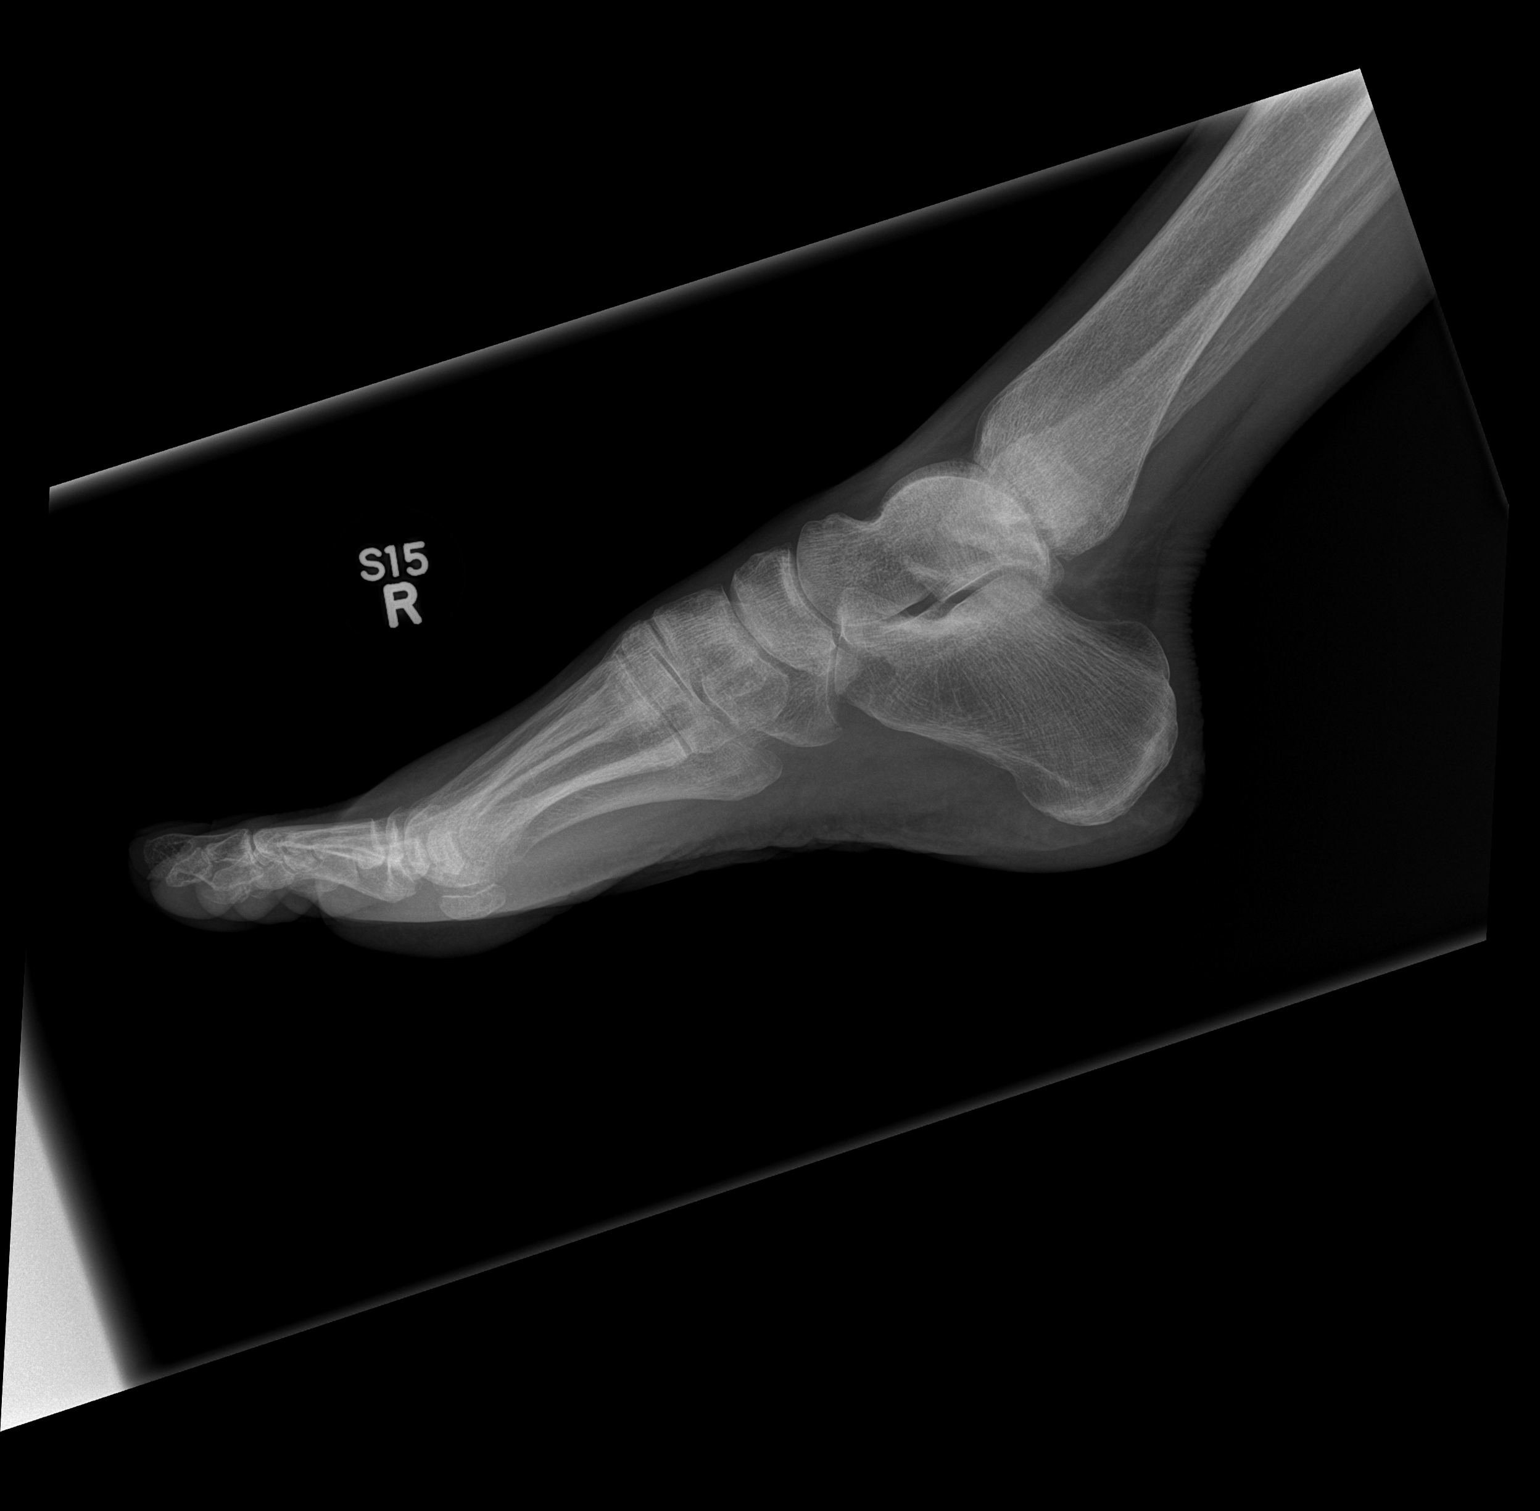

[3 of 3 positions shown; findings below may reference images not displayed]

FINDINGS: Frontal, oblique, and lateral views were obtained. There is a
nondisplaced transversely oriented fracture of the proximal aspect
of the first proximal phalanx. Alignment is anatomic. No other
fracture. No dislocation. There is mild narrowing of all PIP and DIP
joints as well as the first MTP joint. No erosive change peer
IMPRESSION: Nondisplaced fracture, proximal aspect first proximal phalanx. No
other fracture. No dislocation. Narrowing of multiple distal joints.
No erosive change.

## 2016-03-12 ENCOUNTER — Other Ambulatory Visit: Payer: Self-pay | Admitting: Internal Medicine

## 2016-03-14 ENCOUNTER — Other Ambulatory Visit (HOSPITAL_COMMUNITY): Payer: Self-pay | Admitting: Interventional Radiology

## 2016-03-14 DIAGNOSIS — K769 Liver disease, unspecified: Secondary | ICD-10-CM

## 2016-03-17 IMAGING — CT NM PET TUM IMG RESTAG (PS) SKULL BASE T - THIGH
1 of 8 series · 1 of 25 positions shown · non-contrast
Comparison: None.

CLINICAL DATA: Initial treatment strategy for lung nodule.

EXAM:
NUCLEAR MEDICINE PET SKULL BASE TO THIGH
TECHNIQUE: 5.5 mCi F-18 FDG was injected intravenously. Full-ring PET imaging
was performed from the skull base to thigh after the radiotracer. CT
data was obtained and used for attenuation correction and anatomic
localization.
FASTING BLOOD GLUCOSE:  Value: 84 mg/dl

[Series 4: ct sk_thigh 5.0 b31f · axial · 5.0mm · 0.98mm/px · 1 of 212 slices shown]
[im 212/212  brain]
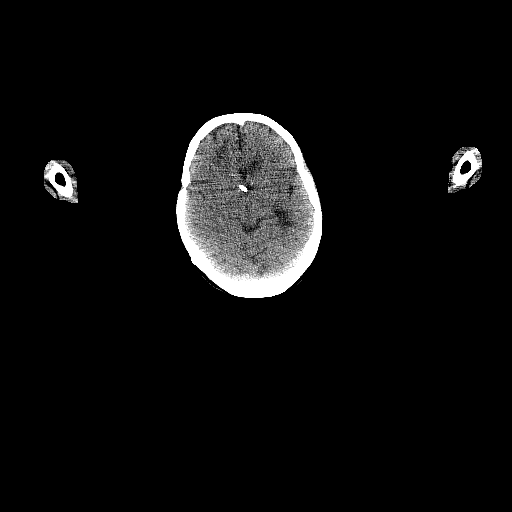

[1 of 25 positions shown; findings below may reference images not displayed]

FINDINGS: NECK

No hypermetabolic lymph nodes in the neck.

CHEST

Spiculated nodule with central lucency in the RIGHT upper lobe
measures 13 mm on image 60, series 4 with SUV max equals 7.2. There
is centrilobular emphysema.

There is a nodular thickening in the inferior aspect of the LEFT
lower lobe measuring 10 mm on image 105, series 4. There is mild
metabolic activity associated with this lower lobe nodule with SUV
max equal 1.8 (lesion misregistration). The nodule appears slightly
decreased in volume from recent CT measuring 14 mm at that time
which which suggests inflammatory infectious process.

No hypermetabolic mediastinal lymph nodes.

ABDOMEN/PELVIS

No abnormal hypermetabolic activity within the liver, pancreas,
adrenal glands, or spleen. No hypermetabolic lymph nodes in the
abdomen or pelvis.

Extensive diverticulosis of the sigmoid colon with metabolic
activity which is likely inflammatory.

A peritoneal catheter without complication.

SKELETON

No focal hypermetabolic activity to suggest skeletal metastasis.
IMPRESSION: 1. Hypermetabolic RIGHT upper lobe nodule is consistent with
bronchogenic carcinoma. Morphologically lesion suggest squamous cell
carcinoma.
2. Mildly hypermetabolic nodule of the LEFT lung base which is
decreased slightly in size in short interval favors infectious or
inflammatory process.
3. No mediastinal lymphadenopathy metastatic mild hypermetabolic
mediastinal adenopathy.
4. Sigmoid diverticulosis.

## 2016-03-18 ENCOUNTER — Ambulatory Visit (INDEPENDENT_AMBULATORY_CARE_PROVIDER_SITE_OTHER): Payer: Medicaid Other | Admitting: Internal Medicine

## 2016-03-18 ENCOUNTER — Encounter: Payer: Self-pay | Admitting: Internal Medicine

## 2016-03-18 VITALS — BP 118/72 | HR 78 | Ht 68.0 in | Wt 106.0 lb

## 2016-03-18 DIAGNOSIS — J449 Chronic obstructive pulmonary disease, unspecified: Secondary | ICD-10-CM

## 2016-03-18 DIAGNOSIS — J984 Other disorders of lung: Secondary | ICD-10-CM | POA: Diagnosis not present

## 2016-03-18 DIAGNOSIS — F17210 Nicotine dependence, cigarettes, uncomplicated: Secondary | ICD-10-CM

## 2016-03-18 MED ORDER — ALBUTEROL SULFATE (2.5 MG/3ML) 0.083% IN NEBU: 2.5000 mg | INHALATION_SOLUTION | RESPIRATORY_TRACT | 12 refills | Status: DC | PRN

## 2016-03-18 NOTE — Patient Instructions (Addendum)
You are cleared for ablation therapy  Plan A = Automatic = Anoro each am   Plan B = Backup Only use your albuterol(Proair)  as a rescue medication to be used if you can't catch your breath by resting or doing a relaxed purse lip breathing pattern.  - The less you use it, the better it will work when you need it. - Ok to use the inhaler up to 2 puffs  every 4 hours if you must but call for appointment if use goes up over your usual need - Don't leave home without it !!  (think of it like the spare tire for your car)    Plan C = Crisis - only use your albuterol nebulizer if you first try Plan B and it fails to help > ok to use the nebulizer up to every 4 hours but if start needing it regularly call for immediate appointment   I will defer to Dr Lisbeth Renshaw re Option of a navigational bx for any nodule that is growing prior to radiation   Keep working on cutting down on smoking > The key is to stop smoking completely before smoking completely stops you!

## 2016-03-18 NOTE — Progress Notes (Signed)
Subjective:  Patient ID: Wanda Prince, female   DOB: 15-Nov-1955    MRN: [id number]    Brief patient profile:  66 yowf quit smoking 04/21/15 with deteriorating sob x weeks > admit   Date of Admission: 04/22/2015   Date of Discharge: 05/07/2015 Attending Physician: Aldine Contes, MD  Discharge Diagnosis: Active Problems:  COPD with acute exacerbation ([place])  COPD exacerbation ([place])  Acute respiratory failure with hypoxemia ([place])  Encounter for imaging study to confirm orogastric (OG) tube placement  Encounter for nasogastric (NG) tube placement  Acute respiratory failure ([place])  Lung mass  Liver mass   05/18/2015 1st [facility] office visit/ Moody Robben  maint rx spiriva/advair but very poor technique  Chief Complaint  Patient presents with  . Pulmonary Consult    Referred by Dr. Suzanna Obey. Pt states that she was dxed with COPD years ago. She c/o increased SOB for the past month. She is SOB just sitting and doing nothing and unable to exert herself much due to SOB.   baseline = used hc parking / sometimes used scooter sometimes not  / now sob at rest  Has flutter not using/ very congested cough but no purulent mucus  Sleeps on 2 pillows ok Very poor insight into meds/ flutter/ purse lip  rec Pantoprazole (protonix) 40 mg   Take  30-60 min before first meal of the day and Pepcid (famotidine)  20 mg one @  bedtime until return to office - this is the best way to tell whether stomach acid is contributing to your problem.   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 Follow-up    Breathing has improved some. She is using albuterol inhaler 2 x daily and neb 1 x daily on average.    now able to walk at HT leaning on cart/ no noct symptoms/ still not smoking  rec Plan A =  [employer] = Continue Anoro one click each am  Plan B=   Backup Only use your albuterol as a rescue medication  Only use the nebulizer if you try the inhaler first and it doesn't work, ok to use up to every 4 hours if you must    07/24/2015  f/u ov/Aryella Besecker re: copd GOLD IV  maint rx anoro  Chief Complaint  Patient presents with  . Follow-up    PFT done at [facility]. Breathing is unchanged. Still using albuterol inhaler 2 x and neb 1 x daily on average.   doe x across the room = MMRC4  = sob if tries to leave home or while getting dressed   rec Pos Quantiferon Gold TB > neg afb smear /culture > set up for [place] for navigational bx November 22 2015    Set up for RT by [facility] 10/25/15     10/24/2015  f/u ov/Bravery Ketcham re:  GOLD IV copd/ maint rx anoro / still smoking  Chief Complaint  Patient presents with  . Follow-up    Breathing has been worse due to humid, hot weather. She has been using albuterol inhaler 3 x daily on average.   doe actually better than before =  MMRC3 = can't walk 100 yards even at a slow pace at a flat grade s stopping due to sob rec The TB test indicates you have been exposed but there is no evidence of active TB at this point No change in medications  Please remember to go to the  xray department downstairs for your tests - we will call you with the results when they are available.   03/18/2016  f/u ov/Addalynne Golding re:  GOLD IV copd/still smoking / needs to be cleared for IR procedure/ maint rx anoro/ saba hfa  And neb  Chief Complaint  Patient presents with  . Follow-up    Pt here for surgical clearance - scheduled May 10, 2016. Pt states that breathing has been "so-so" since last OV.    no change doe, still ok leaning on buggy/ no 02 = MMRC3   No obvious day to day or daytime variability or assoc excess/ purulent sputum or mucus plugs  cp or chest tightness, subjective wheeze or overt sinus or  hb symptoms. No unusual exp hx or h/o childhood pna/ asthma or knowledge of premature birth.  Sleeping ok without nocturnal  or early am exacerbation  of respiratory  c/o's or need for noct saba. Also denies any obvious fluctuation of symptoms with weather or environmental changes or other aggravating or alleviating factors except as outlined above   Current Medications, Allergies, Complete Past Medical History, Past Surgical History, Family History, and Social History were reviewed in [employer] record.  ROS  The following are not active complaints unless bolded sore throat, dysphagia, dental problems, itching, sneezing,  nasal congestion or excess/ purulent secretions, ear ache,   fever, chills, sweats, unintended wt loss, classically pleuritic or exertional cp, hemoptysis,  orthopnea pnd or leg swelling, presyncope, palpitations, abdominal pain, anorexia, nausea, vomiting, diarrhea  or change in bowel or bladder habits, change in stools or urine, dysuria,hematuria,  rash, arthralgias, visual complaints, headache, numbness, weakness or ataxia or problems with walking or coordination,  change in mood/affect or memory.             Objective:  Physical Exam:  Thin amb wf walks with cane    03/18/2016      106  07/24/2015        109   06/01/2015         112   05/07/15 105 lb 4.8 oz (47.764 kg)  01/12/14 115 lb 15.4 oz (52.6 kg)  09/29/12 135 lb (61.236 kg)    Vital signs reviewed  - Note on arrival 02 sats  93% on RA   HEENT: nl dentition, turbinates, and oropharynx. Nl external ear canals without cough reflex   NECK :  without JVD/Nodes/TM/ nl carotid upstrokes bilaterally   LUNGS: no acc muscle use,  slt barrel contour chest  Distant bs marked increased Texp s true wheeze   CV:  RRR  no s3 or murmur or increase in P2, no edema   ABD:  soft and nontender with  Mid to late Pos Hoover's  the supine position. No bruits or organomegaly, bowel sounds nl  MS:  Nl  gait/ ext warm without deformities, calf tenderness, cyanosis or clubbing No obvious joint restrictions   SKIN: warm and dry without lesions    NEURO:  alert, approp, nl sensorium with  no motor deficits      I personally reviewed images and agree with radiology impression as follows:  CT with contrast Chest  11/03/15 1. The cavitary  right upper lobe lesion has not significantly changed from the previous CT of 6 months ago. This could reflect cavitary lung cancer or tuberculosis. 2. There are small left upper and lower lobe pulmonary nodules which are also stable from the prior CT of 6 months ago. 3. No acute findings or adenopathy.     Assessment:

## 2016-03-19 ENCOUNTER — Encounter: Payer: Self-pay | Admitting: Interventional Radiology

## 2016-03-21 NOTE — Assessment & Plan Note (Addendum)
See ct 05/05/15  - PET 06/22/2015 1. 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 2. Mildly hypermetabolic nodule of the LEFT lung base which is decreased slightly in size in short interval favors infectious or inflammatory process. 3. No mediastinal lymphadenopathy metastatic mild hypermetabolic mediastinal adenopathy. - Quant Gold 07/24/2015 > POS > referred to HD > afb neg smear/cultures collected 07/29/15 x 3 faxed to pulmo  09/22/15  >  09/26/2015  referred to Dr Lamonte Sakai ? Navigational bx with afb stain/culture before considering RT?  - CT 11/03/15 > no change in RUL lesion > F/u Dr Lisbeth Renshaw re ? Empirical RT vs navigational bx first

## 2016-03-21 NOTE — Assessment & Plan Note (Signed)

## 2016-03-21 NOTE — Assessment & Plan Note (Signed)
-   trial of Anoro  05/18/15 > better doe  06/01/2015  - Spirometry 06/01/2015  FEV1 0.54 (19%)  Ratio 39   - PFT's  07/24/2015  FEV1 0.65 (21 % ) ratio 29  p no % improvement from saba p anoro am prior to study with DLCO  12 % corrects to 23 % for alv volume   - 10/24/2015  After extensive coaching HFA effectiveness =    75%   She has very severe dz but should be ok for conscious sedation for IR procedure unless having an acute exac in the meantime  I had an extended discussion with the patient reviewing all relevant studies completed to date and  lasting 15 to 20 minutes of a 25 minute visit    Each maintenance medication was reviewed in detail including most importantly the difference between maintenance and prns and under what circumstances the prns are to be triggered using an action plan format that is not reflected in the computer generated alphabetically organized AVS.    Please see instructions for details which were reviewed in writing and the patient given a copy highlighting the part that I personally wrote and discussed at today's ov.

## 2016-03-27 ENCOUNTER — Telehealth: Payer: Self-pay | Admitting: Internal Medicine

## 2016-03-27 ENCOUNTER — Telehealth: Payer: Self-pay | Admitting: Pulmonary Disease

## 2016-03-27 NOTE — Telephone Encounter (Signed)
Office was closed  [facility] 11/30

## 2016-03-27 NOTE — Telephone Encounter (Signed)
Error..Erica R Lovena Le

## 2016-03-29 NOTE — Telephone Encounter (Signed)
There is no "line in the sand" or threshold for denying pts gen anesthesia for a non-thoracic procedure so unless she's having

## 2016-03-29 NOTE — Telephone Encounter (Signed)
MW please clarify response- it looks like the message was cut off.  Thanks!

## 2016-03-29 NOTE — Telephone Encounter (Signed)
Unless she's having a flare of cough/ wheeze/ sob should be ok for gen anesthesia

## 2016-03-29 NOTE — Telephone Encounter (Signed)
Dr. Debbra Riding will be doing the surgery on 05/10/16 and he was going to do general anesthesia, but on the form that was sent over was for sedation---MW please advise if the pt is ok to have the general anesthesia.  They will leave today at 12:30 but we can put a note in epic and they will get it that way.  Please advise. Thanks  No Known Allergies

## 2016-03-29 NOTE — Telephone Encounter (Signed)
I have called [facility] and lmom to make them aware that per MW--unless the pt is having a flare that it would be ok to have the general anesthesia.

## 2016-04-01 ENCOUNTER — Ambulatory Visit (INDEPENDENT_AMBULATORY_CARE_PROVIDER_SITE_OTHER): Payer: Medicaid Other | Admitting: Cardiology

## 2016-04-01 ENCOUNTER — Encounter: Payer: Self-pay | Admitting: Cardiology

## 2016-04-01 VITALS — BP 108/66 | HR 85 | Ht 68.0 in | Wt 108.6 lb

## 2016-04-01 DIAGNOSIS — Z01810 Encounter for preprocedural cardiovascular examination: Secondary | ICD-10-CM

## 2016-04-01 DIAGNOSIS — I2510 Atherosclerotic heart disease of native coronary artery without angina pectoris: Secondary | ICD-10-CM | POA: Diagnosis not present

## 2016-04-01 DIAGNOSIS — F17210 Nicotine dependence, cigarettes, uncomplicated: Secondary | ICD-10-CM | POA: Diagnosis not present

## 2016-04-01 DIAGNOSIS — J984 Other disorders of lung: Secondary | ICD-10-CM | POA: Diagnosis not present

## 2016-04-01 DIAGNOSIS — I10 Essential (primary) hypertension: Secondary | ICD-10-CM | POA: Diagnosis not present

## 2016-04-01 DIAGNOSIS — I5181 Takotsubo syndrome: Secondary | ICD-10-CM

## 2016-04-01 DIAGNOSIS — R0600 Dyspnea, unspecified: Secondary | ICD-10-CM | POA: Insufficient documentation

## 2016-04-01 DIAGNOSIS — R0609 Other forms of dyspnea: Secondary | ICD-10-CM

## 2016-04-01 NOTE — Assessment & Plan Note (Addendum)
Blood pressures if anything a little bit low today on only spironolactone and Toprol.

## 2016-04-01 NOTE — Assessment & Plan Note (Signed)
Most likely consistent with her COPD symptoms. Has not been any different. Plan reassess EF with echocardiogram prior to her hepatic procedure. This is stable, and all seemingly reason to not proceed with the procedure.  If there are any regional wall motion and amount is, would consider stress test - but after hepatic procedure given lack of symptoms.

## 2016-04-01 NOTE — Assessment & Plan Note (Signed)
>  5 min I talked to her and her sister a while but the smoking issue. It appears that she has given up alcohol and illicit drugs that she did well with her ex-husband. She has tried either of cigarettes, but has really not done well. Partly because of her memory issues, she has a tendency to be uncontrolled with her urges and will go to a tinea store and even potentially steal cigarettes. Therefore her sister controls her cigarettes. The sister is basically given up the fight trying to make her stop. After a short conversation is clear that she will not stop smoking. She understands the risk, but refuses to quit.

## 2016-04-01 NOTE — Assessment & Plan Note (Addendum)
Moderate disease by cardiac catheterization years ago. She does continue to have risk factor of smoking, but does not have diabetes or significant hypertension. She is on a beta blocker. Not on aspirin because of liver disease. Non-statin also because of liver disease.  With no active anginal symptoms, I will continue need to evaluate for ischemia. Will simply reassess EF to ensure that his been no change.

## 2016-04-01 NOTE — Assessment & Plan Note (Signed)
Low risk procedure with interventional radiology directed attack ablation. No active angina or heart failure symptoms other than exertional dyspnea which is probably more consistent with her COPD. With normal renal function, and no real true stroke history or CHF/CAD history, she would be a low patient for any procedure. Given the variety she had a prior cardiomyopathy and has exertional dyspnea we will check an echocardiogram just to ensure that his been no change over the last few years. If this is stable, I think is no reason to proceed with any further evaluation prior to letting her go for hepatic procedure. If the echo cardiac exam is normal, think she can safely follow-up with her other physicians and see [place] on a when necessary basis.

## 2016-04-01 NOTE — Assessment & Plan Note (Signed)
She had positive troponin, anterior wall motion abnormality with reduced ejection fraction in the setting of a seizure. 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 Subsequently resolved with follow-up echocardiogram. Because she has ongoing illness and some exertional dyspnea, we will go ahead and recheck an echo to simply confirm that her EF is back to normal with no regional wall motion abnormalities.

## 2016-04-01 NOTE — Patient Instructions (Signed)
SCHEDULE AT [facility] has requested that you have an echocardiogram. Echocardiography is a painless test that uses sound waves to create images of your heart. It provides your doctor with information about the size and shape of your heart and how well your heart's chambers and valves are working. This procedure takes approximately one hour. There are no restrictions for this procedure.   WILL CONTACT YOU WITH [facility] FOR SURGERY AFTER ECHO REPORT  IF TEST IS NORMAL ,FOLLOW UP ON AN AS NEEDED BASIS.

## 2016-04-01 NOTE — Progress Notes (Signed)
PCP: Suzanna Obey, MD  Clinic Note: Chief Complaint  Patient presents with  . Medical Clearance    liver ablation   . Shortness of Breath    HPI: Wanda Prince is a 60 y.o. female with a PMH below who presents today for Pre-op CV Evaluation.Wanda Prince has been followed by [place] -- referred to Dr. Kathlene Cote for Hepatic lesion ablation.  Recent Hospitalizations: None  Studies Reviewed: PSH updated  Echo 12/2013: EF 60-65%. Gr 1 DD.  No significant valve lesions  Cath 05/2011: + Troponin with EF 30-35% & anterior hypokinesis ---> Cath with mild ~40% RCA  Interval History: Wanda Prince presents for pre-procedural evaluation cardiology based on the history of intermittent cardiomyopathy noted back in 2013. She had what appeared to be stress induced cardiomyopathy in the setting of a seizure back in 2013 Gen. a card catheterization showed no significant coronary disease. Her EF at that time was in the 30% range however was improved 2 years later evaluation up to 60-65%. She is a chronic habitual smoker who based on all her other psychiatric and neurologic deficits, her sister has been reluctant to fight this battle because she has become violent when she tries to get cigarettes on her own and including putting distilled lymphoma grocery store. She smokes a pack a day because that's what her sister gives her. She will not quit, despite being aware of the risks.  She has some significant neurologic deficit following an episode where she had presumed head bleed and prolonged period of coma with ICU care. She then has had a VP shunt placed. Since then she's had very poor short-term memory and therefore her sister takes care of her most the time ensuring that she takes her medications.  Wanda Prince really is asymptomatic other than her baseline exertional dyspnea which is on that she's had for years from her smoking/COPD. She has been evaluated for a lung mass that appears to be  non-functional on that scan. This is still undergoing evaluation however. She was found to have a mass in her liver that has grown a little bit and they're planning on doing an ablation procedure. She has been referred for cardiac evaluation.  Other than her exertional dyspnea, she is totally asymptomatic from a cardiac standpoint. She may have some claudication type symptoms that is very hard for me to determine by asking her. But no resting or exertional chest tightness/pressure. No PND, orthopnea or edema.  No palpitations, lightheadedness, dizziness, weakness or syncope/near syncope. No TIA/amaurosis fugax symptoms. No melena, hematochezia, hematuria, or epstaxis.   ROS: A comprehensive was performed. Review of Systems  Constitutional: Positive for malaise/fatigue (but better). Negative for chills, fever and weight loss.  HENT: Negative for congestion and nosebleeds.   Respiratory: Positive for cough, shortness of breath and wheezing. Negative for sputum production.   Cardiovascular: Negative.  Negative for chest pain and claudication.  Gastrointestinal: Negative for blood in stool, melena, nausea and vomiting.  Genitourinary: Negative for hematuria.  Musculoskeletal: Negative for back pain and joint pain.  Skin:       Lots of "blotching" liver spots on arms  Neurological: Positive for tingling (feet). Negative for dizziness and seizures (none since ~2013).  Psychiatric/Behavioral: Positive for memory loss (chronic short term memory loss). Negative for depression.       Somewhat limited mentally - but does ADLs & lives alone - lots of help from her sister  All other systems reviewed and are negative.  Past Medical History:  Diagnosis Date  . 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 COPD (chronic obstructive pulmonary disease) ([facility])   . History of  acute alcoholic hepatitis   . History of alcohol abuse   . History of cocaine abuse   . History of iron deficiency   . Hx of ventricular shunt 2009  . Hypertension   . Left breast mass   . Major depressive disorder    History of  . Meningitis 2009  . Primary cancer of right upper lobe of lung ([facility]) 07/12/2015  . Psychiatric diagnosis    Multiple  . 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 Seizure disorder ([facility])   . Suicide attempt    History of suicide attempts August 2006 and July 2006    Past Surgical History:  Procedure Laterality Date  . BRAIN SURGERY    . breast mass removal    . BREAST SURGERY    . CRANIOTOMY    . IR GENERIC HISTORICAL  02/28/2016   IR RADIOLOGIST EVAL & MGMT 02/28/2016 Aletta Edouard, MD [facility] INTERV RAD  . LEFT HEART CATHETERIZATION WITH CORONARY ANGIOGRAM N/A 06/06/2011   Procedure: LEFT HEART CATHETERIZATION WITH CORONARY ANGIOGRAM;  Surgeon: Burnell Blanks, MD;  Location: [facility] CATH LAB: Moderate ~ 40% RCA disease noted. Otherwise normal  . TRANSTHORACIC ECHOCARDIOGRAM  05/2011   Insetting of seizure, positive troponin. EF 30-35% with anterior hypokinesis.  . TRANSTHORACIC ECHOCARDIOGRAM  12/2013   EF 60-65%. GR 1 DD. No significant valvular disease.   Current Meds  Medication Sig  . acetaminophen (TYLENOL) 500 MG tablet Take 1,000 mg by mouth every 6 (six) hours as needed for pain.  Marland Kitchen albuterol (PROVENTIL HFA;VENTOLIN HFA) 108 (90 BASE) MCG/ACT inhaler Inhale 2 puffs into the lungs every 4 (four) hours as needed for wheezing or shortness of breath.   Marland Kitchen albuterol (PROVENTIL) (2.5 MG/3ML) 0.083% nebulizer solution Take 3 mLs (2.5 mg total) by nebulization every 4 (four) hours as needed for wheezing or shortness of breath.  Jearl Klinefelter ELLIPTA 62.5-25 MCG/INH AEPB INHALE ONE PUFF BY MOUTH ONCE DAILY  . aspirin 81 MG chewable tablet Chew 81 mg by mouth daily.  Marland Kitchen b complex vitamins tablet Take 1 tablet by mouth 2 (two) times  daily.  . cholecalciferol (VITAMIN D) 1000 UNITS tablet Take 1,000 Units by mouth daily.  . cyclobenzaprine (FLEXERIL) 5 MG tablet Take 5 mg by mouth every 8 (eight) hours as needed for muscle spasms.   . folic acid (FOLVITE) 1 MG tablet Take 1 mg by mouth daily.  Marland Kitchen gabapentin (NEURONTIN) 300 MG capsule Take 300 mg by mouth at bedtime.   . levETIRAcetam (KEPPRA) 500 MG tablet Take 1 tablet (500 mg total) by mouth 2 (two) times daily.  . metoprolol succinate (TOPROL XL) 25 MG 24 hr tablet Take 25 mg by mouth daily.   . Multiple Vitamin (MULITIVITAMIN WITH MINERALS) TABS Take 1 tablet by mouth daily.  . naltrexone (DEPADE) 50 MG tablet Take 50 mg by mouth daily.  . pantoprazole (PROTONIX) 20 MG tablet Take 20 mg by mouth daily. 2 tabs daily  . thiamine 100 MG tablet Take 1 tablet (100 mg total) by mouth daily.   No Known Allergies   Social History   Social History  . Marital status: Legally Separated    Spouse  name: N/A  . Number of children: N/A  . Years of education: N/A   Social History Main Topics  . Smoking status: Current Every Day Smoker    Packs/day: 1.00    Years: 40.00    Types: Cigarettes  . Smokeless tobacco: Never Used  . Alcohol use 4.8 oz/week    8 Cans of beer per week     Comment: per week  . Drug use:     Types: Cocaine     Comment: not in years  . Sexual activity: No   Other Topics Concern  . None   Social History Narrative  . None   Family History  Problem Relation Age of Onset  . Coronary artery disease Father     Wt Readings from Last 3 Encounters:  04/01/16 49.3 kg (108 lb 9.6 oz)  03/18/16 48.1 kg (106 lb)  02/28/16 49 kg (108 lb)    PHYSICAL EXAM BP 108/66   Pulse 85   Ht '5\' 8"'$  (1.727 m)   Wt 49.3 kg (108 lb 9.6 oz)   LMP  (LMP Unknown)   BMI 16.51 kg/m  General appearance: Thin, frail/mildly malnourished woman in no acute distress.alert, cooperative, appears stated age; a lot of her questions are answered by the sister and not by  her. She can answer simple questions, but cannot recall details. Neck: no adenopathy, no carotid bruit and no JVD Lungs: clear to auscultation bilaterally, normal percussion bilaterally and non-labored Heart: regular rate and rhythm, S1& S2 normal, no murmur, click, rub or gallop; nondisplaced PMI Abdomen: soft, non-tender; bowel sounds normal; no masses,  no organomegaly; no HJR Extremities: extremities normal, atraumatic, no cyanosis, or edema  Pulses: 2+ and symmetric radial pulses but mildly diminished pedal pulses bilaterally. Skin: mobility and turgor normal, no edema and Diffuse upper extremity "liver spots" with an ecchymotic brown appearance.  Neurologic: Mental status: Alert, oriented, thought content appropriate; Cranial nerves: normal (II-XII grossly intact)    Adult ECG Report  Rate: 85 ;  Rhythm: normal sinus rhythm and Otherwise normal axis, intervals and durations;   Narrative Interpretation: Normal EKG   Other studies Reviewed: Additional studies/ records that were reviewed today include:  Recent Labs:   Lab Results  Component Value Date   CREATININE 0.7 11/03/2015   BUN 10.3 11/03/2015   NA 137 06/07/2015   K 3.8 06/07/2015   CL 99 (L) 06/07/2015   CO2 25 06/07/2015    ASSESSMENT / PLAN: Problem List Items Addressed This Visit    DOE (dyspnea on exertion) (Chronic)    Most likely consistent with her COPD symptoms. Has not been any different. Plan reassess EF with echocardiogram prior to her hepatic procedure. This is stable, and all seemingly reason to not proceed with the procedure.  If there are any regional wall motion and amount is, would consider stress test - but after hepatic procedure given lack of symptoms.      Relevant Orders   EKG 12-Lead   ECHOCARDIOGRAM COMPLETE   Takotsubo cardiomyopathy - history of (Chronic)    She had positive troponin, anterior wall motion abnormality with reduced ejection fraction in the setting of a seizure. This was  called a non-STEMI at the time, but would probably more consistent based on nonobstructive disease in the RCA, to be Takotsubo cardiac myopathy. Subsequently resolved with follow-up echocardiogram. Because she has ongoing illness and some exertional dyspnea, we will go ahead and recheck an echo to simply confirm that her EF is back  to normal with no regional wall motion abnormalities.      Relevant Orders   EKG 12-Lead   ECHOCARDIOGRAM COMPLETE   Cigarette smoker (Chronic)    >5 min I talked to her and her sister a while but the smoking issue. It appears that she has given up alcohol and illicit drugs that she did well with her ex-husband. She has tried either of cigarettes, but has really not done well. Partly because of her memory issues, she has a tendency to be uncontrolled with her urges and will go to a tinea store and even potentially steal cigarettes. Therefore her sister controls her cigarettes. The sister is basically given up the fight trying to make her stop. After a short conversation is clear that she will not stop smoking. She understands the risk, but refuses to quit.      Coronary artery disease, non-occlusive (Chronic)    Moderate disease by cardiac catheterization years ago. She does continue to have risk factor of smoking, but does not have diabetes or significant hypertension. She is on a beta blocker. Not on aspirin because of liver disease. Non-statin also because of liver disease.  With no active anginal symptoms, I will continue need to evaluate for ischemia. Will simply reassess EF to ensure that his been no change.      Relevant Orders   EKG 12-Lead   ECHOCARDIOGRAM COMPLETE   Essential hypertension (Chronic)    Blood pressures if anything a little bit low today on only spironolactone and Toprol.       Relevant Orders   EKG 12-Lead   ECHOCARDIOGRAM COMPLETE   Preoperative cardiovascular examination - Primary    Low risk procedure with interventional radiology  directed attack ablation. No active angina or heart failure symptoms other than exertional dyspnea which is probably more consistent with her COPD. With normal renal function, and no real true stroke history or CHF/CAD history, she would be a low patient for any procedure. Given the variety she had a prior cardiomyopathy and has exertional dyspnea we will check an echocardiogram just to ensure that his been no change over the last few years. If this is stable, I think is no reason to proceed with any further evaluation prior to letting her go for hepatic procedure. If the echo cardiac exam is normal, think she can safely follow-up with her other physicians and see [place] on a when necessary basis.      Cavitating mass in right upper lung lobe      Current medicines are reviewed at length with the patient today. (+/- concerns) none The following changes have been made: none  Patient Instructions  SCHEDULE AT [facility] has requested that you have an echocardiogram. Echocardiography is a painless test that uses sound waves to create images of your heart. It provides your doctor with information about the size and shape of your heart and how well your heart's chambers and valves are working. This procedure takes approximately one hour. There are no restrictions for this procedure.   WILL CONTACT YOU WITH CLEARANCE FOR SURGERY AFTER ECHO REPORT  IF TEST IS NORMAL ,FOLLOW UP ON AN AS NEEDED BASIS.     Studies Ordered:   Orders Placed This Encounter  Procedures  . EKG 12-Lead  . ECHOCARDIOGRAM COMPLETE      Glenetta Hew, M.D., M.S. Interventional Cardiologist   Pager # [phone] Phone # [phone] [address] [place], [place] [address]

## 2016-04-11 NOTE — Progress Notes (Signed)
Pt is being scheduled for preop appt; please place surgical orders in epic. Thanks.  

## 2016-04-24 ENCOUNTER — Other Ambulatory Visit: Payer: Self-pay | Admitting: General Surgery

## 2016-04-25 ENCOUNTER — Ambulatory Visit (HOSPITAL_COMMUNITY): Payer: Medicaid Other | Attending: Cardiovascular Disease

## 2016-04-25 ENCOUNTER — Other Ambulatory Visit: Payer: Self-pay

## 2016-04-25 DIAGNOSIS — F1411 Cocaine abuse, in remission: Secondary | ICD-10-CM | POA: Insufficient documentation

## 2016-04-25 DIAGNOSIS — I2510 Atherosclerotic heart disease of native coronary artery without angina pectoris: Secondary | ICD-10-CM

## 2016-04-25 DIAGNOSIS — I10 Essential (primary) hypertension: Secondary | ICD-10-CM

## 2016-04-25 DIAGNOSIS — R0609 Other forms of dyspnea: Secondary | ICD-10-CM

## 2016-04-25 DIAGNOSIS — F1011 Alcohol abuse, in remission: Secondary | ICD-10-CM | POA: Diagnosis not present

## 2016-04-25 DIAGNOSIS — I429 Cardiomyopathy, unspecified: Secondary | ICD-10-CM | POA: Insufficient documentation

## 2016-04-25 DIAGNOSIS — I5181 Takotsubo syndrome: Secondary | ICD-10-CM

## 2016-04-25 DIAGNOSIS — R0600 Dyspnea, unspecified: Secondary | ICD-10-CM

## 2016-04-26 NOTE — Patient Instructions (Addendum)
Wanda Prince  04/26/2016   Your procedure is scheduled on: Friday 05/10/2016  Report to [facility] Main  Entrance and follow signs to Radiology  at 0630  AM.  Call this number if you have problems the morning of surgery [phone]   Remember: ONLY 1 PERSON MAY GO WITH YOU TO SHORT STAY TO GET  READY MORNING OF [place].   Do not eat food or drink liquids :After Midnight.     Take these medicines the morning of surgery with A SIP OF WATER: Metoprolol, Levetiracetam (Keppra), Anoro Ellipta inhaler, Albuterol inhaler and nebulizer if needed                                  You may not have any metal on your body including hair pins and              piercings  Do not wear jewelry, make-up, lotions, powders or perfumes, deodorant             Do not wear nail polish.  Do not shave  48 hours prior to surgery.              Men may shave face and neck.   Do not bring valuables to the hospital. [place].  Contacts, dentures or bridgework may not be worn into surgery.  Leave suitcase in the car. After surgery it may be brought to your room.                  Please read over the following fact sheets you were given: _____________________________________________________________________             [facility] - Preparing for Surgery Before surgery, you can play an important role.  Because skin is not sterile, your skin needs to be as free of germs as possible.  You can reduce the number of germs on your skin by washing with CHG (chlorahexidine gluconate) soap before surgery.  CHG is an antiseptic cleaner which kills germs and bonds with the skin to continue killing germs even after washing. Please DO NOT use if you have an allergy to CHG or antibacterial soaps.  If your skin becomes reddened/irritated stop using the CHG and inform your nurse when you arrive at Short Stay. Do not shave (including legs and  underarms) for at least 48 hours prior to the first CHG shower.  You may shave your face/neck. Please follow these instructions carefully:  1.  Shower with CHG Soap the night before surgery and the  morning of Surgery.  2.  If you choose to wash your hair, wash your hair first as usual with your  normal  shampoo.  3.  After you shampoo, rinse your hair and body thoroughly to remove the  shampoo.                           4.  Use CHG as you would any other liquid soap.  You can apply chg directly  to the skin and wash                       Gently with a scrungie  or clean washcloth.  5.  Apply the CHG Soap to your body ONLY FROM THE NECK DOWN.   Do not use on face/ open                           Wound or open sores. Avoid contact with eyes, ears mouth and genitals (private parts).                       Wash face,  Genitals (private parts) with your normal soap.             6.  Wash thoroughly, paying special attention to the area where your surgery  will be performed.  7.  Thoroughly rinse your body with warm water from the neck down.  8.  DO NOT shower/wash with your normal soap after using and rinsing off  the CHG Soap.                9.  Pat yourself dry with a clean towel.            10.  Wear clean pajamas.            11.  Place clean sheets on your bed the night of your first shower and do not  sleep with pets. Day of Surgery : Do not apply any lotions/deodorants the morning of surgery.  Please wear clean clothes to the hospital/surgery center.  FAILURE TO FOLLOW THESE INSTRUCTIONS MAY RESULT IN THE CANCELLATION OF YOUR SURGERY PATIENT SIGNATURE_________________________________  NURSE SIGNATURE__________________________________  ________________________________________________________________________  WHAT IS A BLOOD TRANSFUSION? Blood Transfusion Information  A transfusion is the replacement of blood or some of its parts. Blood is made up of multiple cells which provide different  functions.  Red blood cells carry oxygen and are used for blood loss replacement.  White blood cells fight against infection.  Platelets control bleeding.  Plasma helps clot blood.  Other blood products are available for specialized needs, such as hemophilia or other clotting disorders. BEFORE THE TRANSFUSION  Who gives blood for transfusions?   Healthy volunteers who are fully evaluated to make sure their blood is safe. This is blood bank blood. Transfusion therapy is the safest it has ever been in the practice of medicine. Before blood is taken from a donor, a complete history is taken to make sure that person has no history of diseases nor engages in risky social behavior (examples are intravenous drug use or sexual activity with multiple partners). The donor's travel history is screened to minimize risk of transmitting infections, such as malaria. The donated blood is tested for signs of infectious diseases, such as HIV and hepatitis. The blood is then tested to be sure it is compatible with you in order to minimize the chance of a transfusion reaction. If you or a relative donates blood, this is often done in anticipation of surgery and is not appropriate for emergency situations. It takes many days to process the donated blood. RISKS AND COMPLICATIONS Although transfusion therapy is very safe and saves many lives, the main dangers of transfusion include:   Getting an infectious disease.  Developing a transfusion reaction. This is an allergic reaction to something in the blood you were given. Every precaution is taken to prevent this. The decision to have a blood transfusion has been considered carefully by your caregiver before blood is given. Blood is not given unless the benefits outweigh the risks. AFTER THE  TRANSFUSION  Right after receiving a blood transfusion, you will usually feel much better and more energetic. This is especially true if your red blood cells have gotten low  (anemic). The transfusion raises the level of the red blood cells which carry oxygen, and this usually causes an energy increase.  The nurse administering the transfusion will monitor you carefully for complications. HOME CARE INSTRUCTIONS  No special instructions are needed after a transfusion. You may find your energy is better. Speak with your caregiver about any limitations on activity for underlying diseases you may have. SEEK MEDICAL CARE IF:   Your condition is not improving after your transfusion.  You develop redness or irritation at the intravenous (IV) site. SEEK IMMEDIATE MEDICAL CARE IF:  Any of the following symptoms occur over the next 12 hours:  Shaking chills.  You have a temperature by mouth above 102 F (38.9 C), not controlled by medicine.  Chest, back, or muscle pain.  People around you feel you are not acting correctly or are confused.  Shortness of breath or difficulty breathing.  Dizziness and fainting.  You get a rash or develop hives.  You have a decrease in urine output.  Your urine turns a dark color or changes to pink, red, or brown. Any of the following symptoms occur over the next 10 days:  You have a temperature by mouth above 102 F (38.9 C), not controlled by medicine.  Shortness of breath.  Weakness after normal activity.  The white part of the eye turns yellow (jaundice).  You have a decrease in the amount of urine or are urinating less often.  Your urine turns a dark color or changes to pink, red, or brown. Document Released: 04/12/2000 Document Revised: 07/08/2011 Document Reviewed: 11/30/2007 [facility] Patient Information 2014 [place], [place].  _______________________________________________________________________

## 2016-04-30 ENCOUNTER — Encounter (HOSPITAL_COMMUNITY)
Admission: RE | Admit: 2016-04-30 | Discharge: 2016-04-30 | Disposition: A | Discharge status: Home / Self Care | Payer: Medicaid Other | Source: Ambulatory Visit | Attending: Interventional Radiology | Admitting: Interventional Radiology

## 2016-04-30 ENCOUNTER — Encounter (HOSPITAL_COMMUNITY): Payer: Self-pay

## 2016-04-30 ENCOUNTER — Telehealth: Payer: Self-pay | Admitting: Cardiology

## 2016-04-30 DIAGNOSIS — Z01818 Encounter for other preprocedural examination: Secondary | ICD-10-CM | POA: Diagnosis present

## 2016-04-30 DIAGNOSIS — K769 Liver disease, unspecified: Secondary | ICD-10-CM | POA: Insufficient documentation

## 2016-04-30 HISTORY — DX: Unspecified convulsions: R56.9

## 2016-04-30 HISTORY — DX: Other specified postprocedural states: Z98.890

## 2016-04-30 LAB — COMPREHENSIVE METABOLIC PANEL
ALT: 61 U/L — ABNORMAL HIGH (ref 14–54)
AST: 68 U/L — ABNORMAL HIGH (ref 15–41)
Albumin: 3.5 g/dL (ref 3.5–5.0)
Alkaline Phosphatase: 102 U/L (ref 38–126)
Anion gap: 6 (ref 5–15)
BUN: 13 mg/dL (ref 6–20)
CO2: 30 mmol/L (ref 22–32)
Calcium: 9 mg/dL (ref 8.9–10.3)
Chloride: 105 mmol/L (ref 101–111)
Creatinine, Ser: 0.45 mg/dL (ref 0.44–1.00)
GFR calc Af Amer: >60 mL/min (ref >60)
GFR calc non Af Amer: >60 mL/min (ref >60)
Glucose, Bld: 116 mg/dL — ABNORMAL HIGH (ref 65–99)
Potassium: 4.9 mmol/L (ref 3.5–5.1)
Sodium: 141 mmol/L (ref 135–145)
Total Bilirubin: 0.8 mg/dL (ref 0.3–1.2)
Total Protein: 6.5 g/dL (ref 6.5–8.1)

## 2016-04-30 LAB — CBC WITH DIFFERENTIAL/PLATELET
Basophils Absolute: 0 10*3/uL (ref 0.0–0.1)
Basophils Relative: 0 %
Eosinophils Absolute: 0.1 10*3/uL (ref 0.0–0.7)
Eosinophils Relative: 2 %
HCT: 42.1 % (ref 36.0–46.0)
Hemoglobin: 14.1 g/dL (ref 12.0–15.0)
Lymphocytes Relative: 41 %
Lymphs Abs: 2.4 10*3/uL (ref 0.7–4.0)
MCH: 31.8 pg (ref 26.0–34.0)
MCHC: 33.5 g/dL (ref 30.0–36.0)
MCV: 94.8 fL (ref 78.0–100.0)
Monocytes Absolute: 0.5 10*3/uL (ref 0.1–1.0)
Monocytes Relative: 8 %
Neutro Abs: 2.9 10*3/uL (ref 1.7–7.7)
Neutrophils Relative %: 49 %
Platelets: 123 10*3/uL — ABNORMAL LOW (ref 150–400)
RBC: 4.44 MIL/uL (ref 3.87–5.11)
RDW: 13.5 % (ref 11.5–15.5)
WBC: 5.9 10*3/uL (ref 4.0–10.5)

## 2016-04-30 LAB — ABO/RH: ABO/RH(D): B POS

## 2016-04-30 LAB — APTT: aPTT: 34 seconds (ref 24–36)

## 2016-04-30 LAB — PROTIME-INR
INR: 1.13
Prothrombin Time: 14.5 seconds (ref 11.4–15.2)

## 2016-04-30 NOTE — Telephone Encounter (Signed)
New message       Request for surgical clearance:  What type of surgery is being performed?  Thermal ablation 1. When is this surgery scheduled? 05-10-16  2. Are there any medications that need to be held prior to surgery and how long?  Cardiac clearance only  3. Name of physician performing surgery?  Dr Kathlene Cote  What is your office phone and fax number? Need note in epic with echo results and if pt is cleared

## 2016-04-30 NOTE — Telephone Encounter (Signed)
Will forward to dr harding to review and advise.

## 2016-04-30 NOTE — Progress Notes (Signed)
Pre-op consultation completed.  Multiple medical problems. No further testing or optimization necessary.  [employer]

## 2016-05-01 NOTE — Progress Notes (Signed)
Tiffany in Radiology notified to inform Dr. Linard Millers to look at patient's CBC results! Thank you!

## 2016-05-02 NOTE — Telephone Encounter (Signed)
Echocardiogram repeat was normal. Okay to proceed with procedure.  No further evaluation needed.  Glenetta Hew, M.D., M.S. Interventional Cardiologist   Pager # [phone] Phone # [phone] [address] [place], [place] [address]

## 2016-05-03 NOTE — Progress Notes (Signed)
04/01/2016- noted in EPIC- EKG 04/25/2016- noted in [facility] 11/03/2015- noted in [facility] 03/29/2016- in EPIC- note from Dr. Melvyn Novas about surgery

## 2016-05-03 NOTE — Telephone Encounter (Signed)
Will forward this note to vickie [facility]

## 2016-05-06 ENCOUNTER — Other Ambulatory Visit: Payer: Self-pay | Admitting: Radiology

## 2016-05-09 ENCOUNTER — Other Ambulatory Visit: Payer: Self-pay | Admitting: Radiology

## 2016-05-10 ENCOUNTER — Ambulatory Visit (HOSPITAL_COMMUNITY)
Admission: RE | Admit: 2016-05-10 | Discharge: 2016-05-10 | Disposition: A | Discharge status: Home / Self Care | Payer: Medicaid Other | Source: Ambulatory Visit | Attending: Interventional Radiology | Admitting: Interventional Radiology

## 2016-05-10 ENCOUNTER — Observation Stay (HOSPITAL_COMMUNITY): Payer: Medicaid Other

## 2016-05-10 ENCOUNTER — Ambulatory Visit (HOSPITAL_COMMUNITY): Payer: Medicaid Other

## 2016-05-10 ENCOUNTER — Encounter (HOSPITAL_COMMUNITY)
Admission: RE | Disposition: A | Discharge status: Home / Self Care | Payer: Self-pay | Source: Ambulatory Visit | Attending: Interventional Radiology

## 2016-05-10 ENCOUNTER — Encounter (HOSPITAL_COMMUNITY): Payer: Self-pay

## 2016-05-10 ENCOUNTER — Ambulatory Visit (HOSPITAL_COMMUNITY): Payer: Medicaid Other | Admitting: Certified Registered Nurse Anesthetist

## 2016-05-10 ENCOUNTER — Ambulatory Visit (HOSPITAL_COMMUNITY): Payer: Medicaid Other | Admitting: Anesthesiology

## 2016-05-10 ENCOUNTER — Other Ambulatory Visit: Payer: Self-pay | Admitting: Radiology

## 2016-05-10 ENCOUNTER — Observation Stay (HOSPITAL_COMMUNITY)
Admission: RE | Admit: 2016-05-10 | Discharge: 2016-05-11 | Disposition: A | Discharge status: Home / Self Care | Payer: Medicaid Other | Source: Ambulatory Visit | Attending: Interventional Radiology | Admitting: Interventional Radiology

## 2016-05-10 DIAGNOSIS — Z01818 Encounter for other preprocedural examination: Secondary | ICD-10-CM

## 2016-05-10 DIAGNOSIS — I700 Atherosclerosis of aorta: Secondary | ICD-10-CM | POA: Diagnosis not present

## 2016-05-10 DIAGNOSIS — I10 Essential (primary) hypertension: Secondary | ICD-10-CM | POA: Diagnosis not present

## 2016-05-10 DIAGNOSIS — J449 Chronic obstructive pulmonary disease, unspecified: Secondary | ICD-10-CM | POA: Insufficient documentation

## 2016-05-10 DIAGNOSIS — J939 Pneumothorax, unspecified: Secondary | ICD-10-CM

## 2016-05-10 DIAGNOSIS — C220 Liver cell carcinoma: Principal | ICD-10-CM | POA: Diagnosis present

## 2016-05-10 DIAGNOSIS — F17210 Nicotine dependence, cigarettes, uncomplicated: Secondary | ICD-10-CM | POA: Insufficient documentation

## 2016-05-10 DIAGNOSIS — K7460 Unspecified cirrhosis of liver: Secondary | ICD-10-CM

## 2016-05-10 DIAGNOSIS — Z79899 Other long term (current) drug therapy: Secondary | ICD-10-CM | POA: Insufficient documentation

## 2016-05-10 DIAGNOSIS — K769 Liver disease, unspecified: Secondary | ICD-10-CM

## 2016-05-10 DIAGNOSIS — Z7982 Long term (current) use of aspirin: Secondary | ICD-10-CM | POA: Diagnosis not present

## 2016-05-10 HISTORY — PX: RADIOLOGY WITH ANESTHESIA: SHX6223

## 2016-05-10 HISTORY — PX: IR GENERIC HISTORICAL: IMG1180011

## 2016-05-10 LAB — CBC WITH DIFFERENTIAL/PLATELET
Basophils Absolute: 0 10*3/uL (ref 0.0–0.1)
Basophils Relative: 0 %
Eosinophils Absolute: 0.1 10*3/uL (ref 0.0–0.7)
Eosinophils Relative: 2 %
HCT: 40.8 % (ref 36.0–46.0)
Hemoglobin: 14.1 g/dL (ref 12.0–15.0)
Lymphocytes Relative: 40 %
Lymphs Abs: 2.4 10*3/uL (ref 0.7–4.0)
MCH: 32.6 pg (ref 26.0–34.0)
MCHC: 34.6 g/dL (ref 30.0–36.0)
MCV: 94.4 fL (ref 78.0–100.0)
Monocytes Absolute: 0.5 10*3/uL (ref 0.1–1.0)
Monocytes Relative: 9 %
Neutro Abs: 2.9 10*3/uL (ref 1.7–7.7)
Neutrophils Relative %: 49 %
Platelets: 104 10*3/uL — ABNORMAL LOW (ref 150–400)
RBC: 4.32 MIL/uL (ref 3.87–5.11)
RDW: 13.1 % (ref 11.5–15.5)
WBC: 6 10*3/uL (ref 4.0–10.5)

## 2016-05-10 LAB — TYPE AND SCREEN
ABO/RH(D): B POS
Antibody Screen: NEGATIVE

## 2016-05-10 SURGERY — RADIOLOGY WITH ANESTHESIA
Anesthesia: General

## 2016-05-10 MED ORDER — MIDAZOLAM HCL 2 MG/2ML IJ SOLN
INTRAMUSCULAR | Status: AC
  Administered 2016-05-10: 1 mg via INTRAVENOUS
  Filled 2016-05-10: qty 2

## 2016-05-10 MED ORDER — DIPHENHYDRAMINE HCL 12.5 MG/5ML PO ELIX
12.5000 mg | ORAL_SOLUTION | Freq: Four times a day (QID) | ORAL | Status: DC | PRN
  Filled 2016-05-10: qty 5

## 2016-05-10 MED ORDER — GABAPENTIN 300 MG PO CAPS
600.0000 mg | ORAL_CAPSULE | Freq: Every day | ORAL | Status: DC
  Administered 2016-05-10: 600 mg via ORAL
  Filled 2016-05-10: qty 2

## 2016-05-10 MED ORDER — SODIUM CHLORIDE 0.9 % IV SOLN
INTRAVENOUS | Status: DC
  Administered 2016-05-10: 17:00:00 via INTRAVENOUS

## 2016-05-10 MED ORDER — HYDROCODONE-ACETAMINOPHEN 5-325 MG PO TABS
1.0000 | ORAL_TABLET | ORAL | Status: DC | PRN
  Administered 2016-05-11: 1 via ORAL
  Filled 2016-05-10: qty 1

## 2016-05-10 MED ORDER — LEVETIRACETAM 500 MG PO TABS
500.0000 mg | ORAL_TABLET | Freq: Two times a day (BID) | ORAL | Status: DC
  Administered 2016-05-10 – 2016-05-11 (×2): 500 mg via ORAL
  Filled 2016-05-10 (×2): qty 1

## 2016-05-10 MED ORDER — HYDROMORPHONE HCL 1 MG/ML IJ SOLN
INTRAMUSCULAR | Status: AC
  Administered 2016-05-10: 0.5 mg via INTRAVENOUS
  Filled 2016-05-10: qty 1

## 2016-05-10 MED ORDER — ROCURONIUM BROMIDE 10 MG/ML (PF) SYRINGE
PREFILLED_SYRINGE | INTRAVENOUS | Status: DC | PRN
  Administered 2016-05-10: 10 mg via INTRAVENOUS
  Administered 2016-05-10: 40 mg via INTRAVENOUS

## 2016-05-10 MED ORDER — IOPAMIDOL (ISOVUE-370) INJECTION 76%
INTRAVENOUS | Status: AC
  Filled 2016-05-10: qty 100

## 2016-05-10 MED ORDER — IOPAMIDOL (ISOVUE-370) INJECTION 76%
50.0000 mL | Freq: Once | INTRAVENOUS | Status: AC | PRN
  Administered 2016-05-10: 50 mL via INTRAVENOUS

## 2016-05-10 MED ORDER — LACTATED RINGERS IV SOLN: INTRAVENOUS | Status: DC

## 2016-05-10 MED ORDER — NICOTINE 21 MG/24HR TD PT24
21.0000 mg | MEDICATED_PATCH | Freq: Every day | TRANSDERMAL | Status: DC
  Administered 2016-05-10 – 2016-05-11 (×2): 21 mg via TRANSDERMAL
  Filled 2016-05-10 (×2): qty 1

## 2016-05-10 MED ORDER — DEXAMETHASONE SODIUM PHOSPHATE 10 MG/ML IJ SOLN
INTRAMUSCULAR | Status: DC | PRN
  Administered 2016-05-10: 10 mg via INTRAVENOUS

## 2016-05-10 MED ORDER — ALBUTEROL SULFATE (2.5 MG/3ML) 0.083% IN NEBU: 2.5000 mg | INHALATION_SOLUTION | RESPIRATORY_TRACT | Status: DC | PRN

## 2016-05-10 MED ORDER — MIDAZOLAM HCL 2 MG/2ML IJ SOLN
INTRAMUSCULAR | Status: AC
  Filled 2016-05-10: qty 2

## 2016-05-10 MED ORDER — NALOXONE HCL 0.4 MG/ML IJ SOLN: 0.4000 mg | INTRAMUSCULAR | Status: DC | PRN

## 2016-05-10 MED ORDER — FENTANYL CITRATE (PF) 100 MCG/2ML IJ SOLN
INTRAMUSCULAR | Status: DC | PRN
  Administered 2016-05-10 (×4): 50 ug via INTRAVENOUS

## 2016-05-10 MED ORDER — CEFAZOLIN SODIUM-DEXTROSE 2-4 GM/100ML-% IV SOLN
2.0000 g | INTRAVENOUS | Status: AC
  Administered 2016-05-10: 2 g via INTRAVENOUS
  Filled 2016-05-10: qty 100

## 2016-05-10 MED ORDER — ALBUTEROL SULFATE HFA 108 (90 BASE) MCG/ACT IN AERS: 2.0000 | INHALATION_SPRAY | RESPIRATORY_TRACT | Status: DC | PRN

## 2016-05-10 MED ORDER — LACTATED RINGERS IV SOLN
INTRAVENOUS | Status: DC
  Administered 2016-05-10 (×2): via INTRAVENOUS

## 2016-05-10 MED ORDER — PROMETHAZINE HCL 25 MG/ML IJ SOLN
6.2500 mg | Freq: Once | INTRAMUSCULAR | Status: AC
  Administered 2016-05-10: 6.25 mg via INTRAVENOUS

## 2016-05-10 MED ORDER — MIDAZOLAM HCL 5 MG/5ML IJ SOLN
INTRAMUSCULAR | Status: DC | PRN
  Administered 2016-05-10: 2 mg via INTRAVENOUS

## 2016-05-10 MED ORDER — ONDANSETRON HCL 4 MG/2ML IJ SOLN
INTRAMUSCULAR | Status: DC | PRN
  Administered 2016-05-10: 4 mg via INTRAVENOUS

## 2016-05-10 MED ORDER — KETOROLAC TROMETHAMINE 30 MG/ML IJ SOLN
30.0000 mg | Freq: Four times a day (QID) | INTRAMUSCULAR | Status: DC
  Administered 2016-05-10 – 2016-05-11 (×3): 30 mg via INTRAVENOUS
  Filled 2016-05-10 (×3): qty 1

## 2016-05-10 MED ORDER — MEPERIDINE HCL 50 MG/ML IJ SOLN: 6.2500 mg | INTRAMUSCULAR | Status: DC | PRN

## 2016-05-10 MED ORDER — METOPROLOL SUCCINATE ER 25 MG PO TB24
25.0000 mg | ORAL_TABLET | Freq: Every day | ORAL | Status: DC
  Administered 2016-05-11: 25 mg via ORAL
  Filled 2016-05-10: qty 1

## 2016-05-10 MED ORDER — LIDOCAINE 2% (20 MG/ML) 5 ML SYRINGE
INTRAMUSCULAR | Status: DC | PRN
  Administered 2016-05-10: 60 mg via INTRAVENOUS

## 2016-05-10 MED ORDER — METOCLOPRAMIDE HCL 5 MG/ML IJ SOLN
INTRAMUSCULAR | Status: DC | PRN
  Administered 2016-05-10: 10 mg via INTRAVENOUS

## 2016-05-10 MED ORDER — DOCUSATE SODIUM 100 MG PO CAPS
100.0000 mg | ORAL_CAPSULE | Freq: Two times a day (BID) | ORAL | Status: DC
  Administered 2016-05-10 – 2016-05-11 (×2): 100 mg via ORAL
  Filled 2016-05-10 (×2): qty 1

## 2016-05-10 MED ORDER — PROMETHAZINE HCL 25 MG/ML IJ SOLN
INTRAMUSCULAR | Status: AC
  Administered 2016-05-10: 6.25 mg
  Filled 2016-05-10: qty 1

## 2016-05-10 MED ORDER — SUCCINYLCHOLINE CHLORIDE 200 MG/10ML IV SOSY
PREFILLED_SYRINGE | INTRAVENOUS | Status: DC | PRN
  Administered 2016-05-10: 80 mg via INTRAVENOUS

## 2016-05-10 MED ORDER — HYDROMORPHONE HCL 1 MG/ML IJ SOLN: 1.0000 mg | INTRAMUSCULAR | Status: DC | PRN

## 2016-05-10 MED ORDER — DIPHENHYDRAMINE HCL 50 MG/ML IJ SOLN
12.5000 mg | Freq: Four times a day (QID) | INTRAMUSCULAR | Status: DC | PRN
  Administered 2016-05-10: 12.5 mg via INTRAVENOUS
  Filled 2016-05-10: qty 1

## 2016-05-10 MED ORDER — ONDANSETRON HCL 4 MG/2ML IJ SOLN: 4.0000 mg | Freq: Four times a day (QID) | INTRAMUSCULAR | Status: DC | PRN

## 2016-05-10 MED ORDER — MIDAZOLAM HCL 2 MG/2ML IJ SOLN
1.0000 mg | Freq: Once | INTRAMUSCULAR | Status: AC
  Administered 2016-05-10: 1 mg via INTRAVENOUS

## 2016-05-10 MED ORDER — IOPAMIDOL (ISOVUE-300) INJECTION 61%: 50.0000 mL | Freq: Once | INTRAVENOUS | Status: DC | PRN

## 2016-05-10 MED ORDER — SODIUM CHLORIDE 0.9% FLUSH: 9.0000 mL | INTRAVENOUS | Status: DC | PRN

## 2016-05-10 MED ORDER — PROPOFOL 10 MG/ML IV BOLUS
INTRAVENOUS | Status: DC | PRN
  Administered 2016-05-10: 110 mg via INTRAVENOUS

## 2016-05-10 MED ORDER — SENNOSIDES-DOCUSATE SODIUM 8.6-50 MG PO TABS: 1.0000 | ORAL_TABLET | Freq: Every day | ORAL | Status: DC | PRN

## 2016-05-10 MED ORDER — HYDROMORPHONE HCL 1 MG/ML IJ SOLN
0.2500 mg | INTRAMUSCULAR | Status: DC | PRN
  Administered 2016-05-10 (×4): 0.5 mg via INTRAVENOUS

## 2016-05-10 MED ORDER — FENTANYL CITRATE (PF) 100 MCG/2ML IJ SOLN
INTRAMUSCULAR | Status: AC
  Filled 2016-05-10: qty 6

## 2016-05-10 MED ORDER — PROCHLORPERAZINE EDISYLATE 5 MG/ML IJ SOLN: 10.0000 mg | Freq: Four times a day (QID) | INTRAMUSCULAR | Status: DC | PRN

## 2016-05-10 MED ORDER — ONDANSETRON HCL 4 MG/2ML IJ SOLN: 4.0000 mg | Freq: Once | INTRAMUSCULAR | Status: DC | PRN

## 2016-05-10 MED ORDER — SUGAMMADEX SODIUM 200 MG/2ML IV SOLN
INTRAVENOUS | Status: DC | PRN
  Administered 2016-05-10: 150 mg via INTRAVENOUS

## 2016-05-10 MED ORDER — HYDROMORPHONE 1 MG/ML IV SOLN
INTRAVENOUS | Status: DC
  Administered 2016-05-10: 1.8 mg via INTRAVENOUS
  Administered 2016-05-10: 14:00:00 via INTRAVENOUS
  Administered 2016-05-10: 0 mg via INTRAVENOUS
  Administered 2016-05-11: 2.1 mg via INTRAVENOUS

## 2016-05-10 NOTE — Procedures (Signed)
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 Thermal ablation performed with 2 NeuWave PR ablation probes bracketing tumor. Post ablation CT shows tiny anterior basilar PTX.    Plan:  PACU recovery followed by overnight observation.  CXR in PACU and in AM.     Eulas Post T. Kathlene Cote, M.D Pager:  [phone]

## 2016-05-10 NOTE — Progress Notes (Signed)
Patient ID: Wanda Prince, female   DOB: February 19, 1956, 61 y.o.   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

## 2016-05-10 NOTE — H&P (Deleted)
  The note originally documented on this encounter has been moved the the encounter in which it belongs.  

## 2016-05-10 NOTE — Transfer of Care (Signed)
Immediate Anesthesia Transfer of Care Note  Patient: Wanda Prince  Procedure(s) Performed: Procedure(s): Microwave thermal ablation liver (N/A)  Patient Location: PACU  Anesthesia Type:General  Level of Consciousness:  sedated, patient cooperative and responds to stimulation  Airway & Oxygen Therapy:Patient Spontanous Breathing and Patient connected to face mask oxgen  Post-op Assessment:  Report given to PACU RN and Post -op Vital signs reviewed and stable  Post vital signs:  Reviewed and stable  Last Vitals:  Vitals:   05/10/16 0702  BP: (!) 99/59  Pulse: 80  Resp: 18  Temp: 40.9 C    Complications: No apparent anesthesia complications

## 2016-05-10 NOTE — H&P (Signed)
Chief Complaint: Patient was seen in consultation today for hepatic lesion concerning for [facility] at the request of Dr. Arta Silence  Referring Physician(s): Dr. Arta Silence  Supervising Physician: Aletta Edouard  Patient Status: [facility] - Out-pt  History of Present Illness: Wanda Prince is a 61 y.o. female who has been worked up for a hepatic lesion, known hx of EtOH cirrhosis. Her workup of the lesion is concerning for [facility] and she has been seen by Dr. Kathlene Cote. After that consult, she has been scheduled for CT guided thermal ablation of this lesion. In the interim, she has been seen by both Pulmonary and Cardiology for pre-op evaluation given her underlying medical problems. She has been given clearance to move forward with the procedure. She feels well. No recent fever, chills, illness, abd pain, diarrhea, dysuria. Has been NPO this am. Daughter is here with her.  Past Medical History:  Diagnosis Date  . 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 COPD (chronic obstructive pulmonary disease) ([place])   . H/O tracheostomy 04/22/2015   respiratory destress   . History of acute alcoholic hepatitis   . History of alcohol abuse   . History of cocaine abuse   . History of iron deficiency   . Hx of ventricular shunt 2009  . Hypertension   . Left breast mass   . Major depressive disorder    History of  . Meningitis 2009  . Primary cancer of right upper lobe of lung ([place]) 07/12/2015  . Psychiatric diagnosis    Multiple  . 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 Seizure disorder ([place])   . Seizures ([place])   . Suicide attempt    History of suicide attempts August 2006 and July 2006    Past Surgical History:  Procedure Laterality Date  . BACK SURGERY    . BRAIN SURGERY    . breast mass removal    . BREAST SURGERY    .  CRANIOTOMY    . IR GENERIC HISTORICAL  02/28/2016   IR RADIOLOGIST EVAL & MGMT 02/28/2016 Aletta Edouard, MD [facility] INTERV RAD  . LEFT HEART CATHETERIZATION WITH CORONARY ANGIOGRAM N/A 06/06/2011   Procedure: LEFT HEART CATHETERIZATION WITH CORONARY ANGIOGRAM;  Surgeon: Burnell Blanks, MD;  Location: [facility] CATH LAB: Moderate ~ 40% RCA disease noted. Otherwise normal  . TRANSTHORACIC ECHOCARDIOGRAM  05/2011   Insetting of seizure, positive troponin. EF 30-35% with anterior hypokinesis.  . TRANSTHORACIC ECHOCARDIOGRAM  12/2013   EF 60-65%. GR 1 DD. No significant valvular disease.    Allergies: Patient has no known allergies.  Medications: Prior to Admission medications   Medication Sig Start Date End Date Taking? Authorizing Provider  acetaminophen (TYLENOL) 500 MG tablet Take 1,000 mg by mouth every 6 (six) hours as needed for pain.    Historical Provider, MD  albuterol (PROVENTIL HFA;VENTOLIN HFA) 108 (90 BASE) MCG/ACT inhaler Inhale 2 puffs into the lungs every 4 (four) hours as needed for wheezing or shortness of breath.     Historical Provider, MD  albuterol (PROVENTIL) (2.5 MG/3ML) 0.083% nebulizer solution Take 3 mLs (2.5 mg total) by nebulization every 4 (four) hours as needed for wheezing or shortness of breath. 03/18/16   Tanda Rockers, MD  ANORO ELLIPTA 62.5-25 MCG/INH AEPB INHALE ONE PUFF BY MOUTH ONCE DAILY 03/12/16   Legrand Como  Mckinley Jewel, MD  aspirin 81 MG chewable tablet Chew 81 mg by mouth daily.    Historical Provider, MD  b complex vitamins tablet Take 1 tablet by mouth 2 (two) times daily. 01/28/12   Eugenie Filler, MD  Cholecalciferol (CVS D3) 2000 units CAPS Take 2,000 Units by mouth daily.    Historical Provider, MD  cyclobenzaprine (FLEXERIL) 5 MG tablet Take 5 mg by mouth every 8 (eight) hours as needed for muscle spasms.  11/22/13   Historical Provider, MD  diclofenac sodium (VOLTAREN) 1 % GEL Apply 2 g topically 2 (two) times daily as needed (pain).    Historical  Provider, MD  famotidine (PEPCID) 20 MG tablet Take 20 mg by mouth at bedtime.    Historical Provider, MD  folic acid (FOLVITE) 1 MG tablet Take 1 mg by mouth daily.    Historical Provider, MD  gabapentin (NEURONTIN) 300 MG capsule Take 600 mg by mouth at bedtime.     Historical Provider, MD  levETIRAcetam (KEPPRA) 500 MG tablet Take 1 tablet (500 mg total) by mouth 2 (two) times daily. 01/28/12   Eugenie Filler, MD  metoprolol succinate (TOPROL XL) 25 MG 24 hr tablet Take 25 mg by mouth daily.  09/30/13   Historical Provider, MD  Multiple Vitamin (MULITIVITAMIN WITH MINERALS) TABS Take 1 tablet by mouth daily. 06/07/11   Dorian Heckle, MD  naltrexone (DEPADE) 50 MG tablet Take 50 mg by mouth daily.    Historical Provider, MD  pantoprazole (PROTONIX) 40 MG tablet Take 40 mg by mouth daily.    Historical Provider, MD  spironolactone (ALDACTONE) 25 MG tablet Take 25 mg by mouth daily.  12/08/13 04/23/16  Historical Provider, MD  thiamine 100 MG tablet Take 1 tablet (100 mg total) by mouth daily. 06/07/11   Dorian Heckle, MD     Family History  Problem Relation Age of Onset  . Coronary artery disease Father     Social History   Social History  . Marital status: Legally Separated    Spouse name: N/A  . Number of children: N/A  . Years of education: N/A   Social History Main Topics  . Smoking status: Current Every Day Smoker    Packs/day: 1.00    Years: 40.00    Types: Cigarettes  . Smokeless tobacco: Never Used  . Alcohol use 4.8 oz/week    8 Cans of beer per week     Comment: per week  . Drug use:     Types: Cocaine     Comment: not in years  . Sexual activity: No   Other Topics Concern  . Not on file   Social History Narrative  . No narrative on file     Review of Systems: A 12 point ROS discussed and pertinent positives are indicated in the HPI above.  All other systems are negative.  Review of Systems  Vital Signs: LMP  (LMP Unknown)   Physical Exam    Constitutional: She is oriented to person, place, and time. She appears well-developed and well-nourished. No distress.  HENT:  Head: Normocephalic.  Mouth/Throat: Oropharynx is clear and moist.  Neck: Normal range of motion. No tracheal deviation present.  Cardiovascular: Normal rate, regular rhythm and normal heart sounds.   Pulmonary/Chest: Effort normal and breath sounds normal. No respiratory distress.  Abdominal: Soft. She exhibits no mass. There is no tenderness.  Neurological: She is alert and oriented to person, place, and time.  Skin: Skin is warm and  dry.  Psychiatric: She has a normal mood and affect. Judgment normal.    Imaging: Dg Chest 1 View  Result Date: 05/10/2016 CLINICAL DATA:  Preoperative exam for tissue ablation. EXAM: CHEST 1 VIEW COMPARISON:  CT 11/03/2015 FINDINGS: Heart size is normal. There is aortic atherosclerosis. Mediastinal shadows are otherwise normal. VP shunt tube passes over the right chest. Lungs markedly hyperinflated consistent with diffuse emphysema. Bilateral upper lobe pulmonary nodules do not appear to have enlarged. No sign of acute infiltrate or effusion. No acute bone finding. IMPRESSION: Emphysema. Bilateral upper lobe pulmonary nodules, probably unchanged when compared to the CT of July 2017. Follow-up CT was recommended on the basis of the previous scan and would be due presently. Electronically Signed   By: Nelson Chimes M.D.   On: 05/10/2016 07:33    Labs:  CBC:  Recent Labs  06/07/15 0951 04/30/16 1526 05/10/16 0722  WBC 13.8* 5.9 6.0  HGB 15.4* 14.1 14.1  HCT 45.6 42.1 40.8  PLT 240 123* 104*    COAGS:  Recent Labs  06/07/15 0952 04/30/16 1526  INR 1.08 1.13  APTT 32 34    BMP:  Recent Labs  06/07/15 0951 11/03/15 1140 04/30/16 1526  NA 137  --  141  K 3.8  --  4.9  CL 99*  --  105  CO2 25  --  30  GLUCOSE 146*  --  116*  BUN 9 10.3 13  CALCIUM 8.5*  --  9.0  CREATININE 0.66 0.7 0.45  GFRNONAA >60  --   >60  GFRAA >60  --  >60    LIVER FUNCTION TESTS:  Recent Labs  06/07/15 0951 04/30/16 1526  BILITOT 1.4* 0.8  AST 53* 68*  ALT 38 61*  ALKPHOS 136* 102  PROT 6.9 6.5  ALBUMIN 2.6* 3.5    TUMOR MARKERS: No results for input(s): AFPTM, CEA, CA199, CHROMGRNA in the last 8760 hours.  Assessment and Plan: Hepatic lesion, suspicious for [place] for CT guided thermal ablation under general anesthesia. Labs ok. Risks and Benefits discussed with the patient including, but not limited to bleeding, infection, damage to adjacent structures or low yield requiring additional tests. All of the patient's questions were answered, patient is agreeable to proceed. Consent signed and in chart.    Thank you for this interesting consult.    A copy of this report was sent to the requesting provider on this date.  Electronically Signed: Ascencion Dike 05/10/2016, 8:36 AM   I spent a total of 20 minutes in face to face in clinical consultation, greater than 50% of which was counseling/coordinating care for hepatic lesion ablation

## 2016-05-10 NOTE — Anesthesia Procedure Notes (Signed)
Procedure Name: Intubation Date/Time: 05/10/2016 9:00 AM Performed by: Montel Clock Pre-anesthesia Checklist: Patient identified, Emergency Drugs available, Suction available, Patient being monitored and Timeout performed Patient Re-evaluated:Patient Re-evaluated prior to inductionOxygen Delivery Method: Circle system utilized Preoxygenation: Pre-oxygenation with 100% oxygen Intubation Type: IV induction Ventilation: Mask ventilation without difficulty Laryngoscope Size: Mac and 3 Grade View: Grade I Tube type: Oral Tube size: 7.0 mm Number of attempts: 1 Airway Equipment and Method: Stylet Placement Confirmation: ETT inserted through vocal cords under direct vision,  positive ETCO2 and breath sounds checked- equal and bilateral Secured at: 21 cm Tube secured with: Tape Dental Injury: Teeth and Oropharynx as per pre-operative assessment

## 2016-05-10 NOTE — Progress Notes (Signed)
Dr Conrad Edgewood aware pt will has PCA Dilaudid ordered.  States of to tx to 4th floor without tele when bed available.

## 2016-05-10 NOTE — Progress Notes (Signed)
BP (!) 146/78 (BP Location: Right Arm)   Pulse 62   Temp 97.6 F (36.4 C)   Resp 13   Ht 5' 7.5" (1.715 m)   Wt 107 lb (48.5 kg)   LMP  (LMP Unknown)   SpO2 100%   BMI 16.51 kg/m  Pt seen in PACU. Still very uncomfortable, c/o pain and nausea. No vomiting so far. Has been given IV Dilaudid and Phenergan, not much relief. Vitals stable, sats 99-100% Reviewed CXR, no obvious PTX  Will start PCA Dilaudid for pain control. Pt to be admitted to 4th floor  Ascencion Dike PA-C Interventional Radiology 05/10/2016 1:42 PM

## 2016-05-10 NOTE — Anesthesia Postprocedure Evaluation (Signed)
Anesthesia Post Note  Patient: TEKIA [place]ATER[place]Y  Procedure(s) Performed: Procedure(s) (LRB): Microwave thermal ablation liver (N/A)  Patient location during evaluation: PACU Anesthesia Type: General Level of consciousness: awake and alert Pain management: pain level controlled Vital Signs Assessment: post-procedure vital signs reviewed and stable Respiratory status: spontaneous breathing, nonlabored ventilation, respiratory function stable and patient connected to nasal cannula oxygen Cardiovascular status: blood pressure returned to baseline and stable Postop Assessment: no signs of nausea or vomiting Anesthetic complications: no       Last Vitals:  Vitals:   05/10/16 1315 05/10/16 1330  BP: (!) 145/79 (!) 146/78  Pulse: 63 62  Resp: 14 13  Temp:      Last Pain:  Vitals:   05/10/16 1330  TempSrc:   PainSc: [place] DAVID

## 2016-05-10 NOTE — Anesthesia Preprocedure Evaluation (Signed)
Anesthesia Evaluation  Patient identified by MRN, date of birth, ID band Patient awake    Reviewed: Allergy & Precautions, NPO status , Patient's Chart, lab work & pertinent test results  Airway Mallampati: I  TM Distance: >3 FB Neck ROM: Full    Dental   Pulmonary COPD, Current Smoker,    Pulmonary exam normal        Cardiovascular hypertension, Pt. on medications Normal cardiovascular exam     Neuro/Psych    GI/Hepatic   Endo/Other    Renal/GU      Musculoskeletal   Abdominal   Peds  Hematology   Anesthesia Other Findings   Reproductive/Obstetrics                             Anesthesia Physical Anesthesia Plan  ASA: III  Anesthesia Plan: General   Post-op Pain Management:    Induction: Intravenous  Airway Management Planned: Oral ETT  Additional Equipment:   Intra-op Plan:   Post-operative Plan: Extubation in OR  Informed Consent: I have reviewed the patients History and Physical, chart, labs and discussed the procedure including the risks, benefits and alternatives for the proposed anesthesia with the patient or authorized representative who has indicated his/her understanding and acceptance.     Plan Discussed with: CRNA and Surgeon  Anesthesia Plan Comments:         Anesthesia Quick Evaluation

## 2016-05-11 ENCOUNTER — Inpatient Hospital Stay (HOSPITAL_COMMUNITY): Payer: Medicaid Other

## 2016-05-11 DIAGNOSIS — K769 Liver disease, unspecified: Secondary | ICD-10-CM | POA: Diagnosis present

## 2016-05-11 DIAGNOSIS — C220 Liver cell carcinoma: Secondary | ICD-10-CM | POA: Diagnosis not present

## 2016-05-11 LAB — COMPREHENSIVE METABOLIC PANEL
ALT: 74 U/L — ABNORMAL HIGH (ref 14–54)
AST: 156 U/L — ABNORMAL HIGH (ref 15–41)
Albumin: 2.9 g/dL — ABNORMAL LOW (ref 3.5–5.0)
Alkaline Phosphatase: 94 U/L (ref 38–126)
Anion gap: 5 (ref 5–15)
BUN: 14 mg/dL (ref 6–20)
CO2: 29 mmol/L (ref 22–32)
Calcium: 8.3 mg/dL — ABNORMAL LOW (ref 8.9–10.3)
Chloride: 105 mmol/L (ref 101–111)
Creatinine, Ser: 0.6 mg/dL (ref 0.44–1.00)
GFR calc Af Amer: >60 mL/min (ref >60)
GFR calc non Af Amer: >60 mL/min (ref >60)
Glucose, Bld: 88 mg/dL (ref 65–99)
Potassium: 4.3 mmol/L (ref 3.5–5.1)
Sodium: 139 mmol/L (ref 135–145)
Total Bilirubin: 0.7 mg/dL (ref 0.3–1.2)
Total Protein: 5.2 g/dL — ABNORMAL LOW (ref 6.5–8.1)

## 2016-05-11 LAB — CBC
HCT: 39.2 % (ref 36.0–46.0)
Hemoglobin: 12.9 g/dL (ref 12.0–15.0)
MCH: 31 pg (ref 26.0–34.0)
MCHC: 32.9 g/dL (ref 30.0–36.0)
MCV: 94.2 fL (ref 78.0–100.0)
Platelets: 104 10*3/uL — ABNORMAL LOW (ref 150–400)
RBC: 4.16 MIL/uL (ref 3.87–5.11)
RDW: 13.3 % (ref 11.5–15.5)
WBC: 12.1 10*3/uL — ABNORMAL HIGH (ref 4.0–10.5)

## 2016-05-11 NOTE — Discharge Summary (Signed)
Patient ID: Wanda Prince MRN: [id number] DOB/AGE: [birth year] 61 y.o.  Admit date: 05/10/2016 Discharge date: 05/11/2016  Supervising Physician: Aletta Edouard  Patient Status: [facility] - In-pt  Admission Diagnoses: liver lesion  Discharge Diagnoses:  Active Problems:   Hepatocellular carcinoma ([place])   Liver lesion   Discharged Condition: improved  Hospital Course: Hx Cirrhosis/Hep C. Known liver lesion - anterior subcapsular in right lobe Enlarging per MRI 12/2015. Concerning for hepatocellular carcinoma. Request for consult for treatment per Dr Margurite Auerbach. Consult with Dr Kathlene Cote 02/28/2016. Scheduled for thermal ablation and performed 05/10/16 in IR. Small basilar PTX post procedure. Remains this am --pt is asymptomatic --denies pain; sob. Admitted overnight for observation; uneventful stay. Eating well; slept well Urinating well clear yellow Passing gas I have seen and examined pt; discussed pt status with dr Pascal Lux Plan for discharge to home now. Plan for 4 week follow up with Dr Kathlene Cote Pt has good understanding of dc plan and instructions    Consults: None  Significant Diagnostic Studies: CT Abdomen  Treatments: Procedure:  CT guided thermal ablation of hepatocellular carcinoma Complications: Small right basilar PTX Findings:  Contrast enhanced CT shows enlarged, hypervascular anterior subcapsular tumor in segment 5 measuring 12 x 16 mm. Thermal ablation performed with 2 NeuWave PR ablation probes bracketing tumor. Post ablation CT shows tiny anterior basilar PTX.     Discharge Exam: Blood pressure 100/60, pulse 86, temperature 98.2 F (36.8 C), temperature source Oral, resp. rate 17, height 5' 7.5" (1.715 m), weight 107 lb (48.5 kg), SpO2 94 %.   PE: A/O; pleasant In NAD  Heart: RRR Lungs: CTA; In NAD; denies pain 1/13: CXR: IMPRESSION: 1. Less than 5% right basilar pneumothorax with interval small amount of subcutaneous air. 2. Mild changes of  COPD. 3. Aortic atherosclerosis.  Abd: soft; +BS NT; site of ablation is clean and dry NT- no hematoma Extr: FROM UOP: great; clear yellow  Results for orders placed or performed during the hospital encounter of 05/10/16  CBC  Result Value Ref Range   WBC 12.1 (H) 4.0 - 10.5 K/uL   RBC 4.16 3.87 - 5.11 MIL/uL   Hemoglobin 12.9 12.0 - 15.0 g/dL   HCT 39.2 36.0 - 46.0 %   MCV 94.2 78.0 - 100.0 fL   MCH 31.0 26.0 - 34.0 pg   MCHC 32.9 30.0 - 36.0 g/dL   RDW 13.3 11.5 - 15.5 %   Platelets 104 (L) 150 - 400 K/uL  Comprehensive metabolic panel  Result Value Ref Range   Sodium 139 135 - 145 mmol/L   Potassium 4.3 3.5 - 5.1 mmol/L   Chloride 105 101 - 111 mmol/L   CO2 29 22 - 32 mmol/L   Glucose, Bld 88 65 - 99 mg/dL   BUN 14 6 - 20 mg/dL   Creatinine, Ser 0.60 0.44 - 1.00 mg/dL   Calcium 8.3 (L) 8.9 - 10.3 mg/dL   Total Protein 5.2 (L) 6.5 - 8.1 g/dL   Albumin 2.9 (L) 3.5 - 5.0 g/dL   AST 156 (H) 15 - 41 U/L   ALT 74 (H) 14 - 54 U/L   Alkaline Phosphatase 94 38 - 126 U/L   Total Bilirubin 0.7 0.3 - 1.2 mg/dL   GFR calc non Af Amer >60 >60 mL/min   GFR calc Af Amer >60 >60 mL/min   Anion gap 5 5 - 15    Disposition: Thermal ablation of anterior subcapsular hepatocellular carcinoma of liver Procedure performed in IR 1/12  with Dr Doristine Counter basilar pneumothorax: asymptomatic I have discussed pt status with Dr Pascal Lux Plan for discharge to home Pt has good understanding of discharge plans and instructions To return to hospital if has any SOB or chest pain Rx Percocet # 10 prn Continue all home meds Follow up with Dr Kathlene Cote 4 weeks---she will hear from scheduler for time and date   Discharge Instructions    Call MD for:  difficulty breathing, headache or visual disturbances    Complete by:  As directed    Call MD for:  extreme fatigue    Complete by:  As directed    Call MD for:  hives    Complete by:  As directed    Call MD for:  persistant dizziness or  light-headedness    Complete by:  As directed    Call MD for:  persistant nausea and vomiting    Complete by:  As directed    Call MD for:  redness, tenderness, or signs of infection (pain, swelling, redness, odor or green/yellow discharge around incision site)    Complete by:  As directed    Call MD for:  severe uncontrolled pain    Complete by:  As directed    Call [phone] or [phone] if increased shortness of breath or pain   Call MD for:  temperature >100.4    Complete by:  As directed    Diet - low sodium heart healthy    Complete by:  As directed    Discharge instructions    Complete by:  As directed    Resume all home meds; 4 week follow up with Dr Particia Nearing will hear from scheduler for time and date; call [phone] if issues or concerns   Discharge wound care:    Complete by:  As directed    May shower today; keep clean bad aid on liver procedure site daily x 1 week   Driving Restrictions    Complete by:  As directed    No driving x 4 days   Increase activity slowly    Complete by:  As directed    Lifting restrictions    Complete by:  As directed    No lifting over 10 lbs x 4 days     Allergies as of 05/11/2016   No Known Allergies     Medication List    TAKE these medications   acetaminophen 500 MG tablet Commonly known as:  TYLENOL Take 1,000 mg by mouth every 6 (six) hours as needed for pain.   albuterol (2.5 MG/3ML) 0.083% nebulizer solution Commonly known as:  PROVENTIL Take 3 mLs (2.5 mg total) by nebulization every 4 (four) hours as needed for wheezing or shortness of breath.   albuterol 108 (90 Base) MCG/ACT inhaler Commonly known as:  PROVENTIL HFA;VENTOLIN HFA Inhale 2 puffs into the lungs every 4 (four) hours as needed for wheezing or shortness of breath.   ALDACTONE 25 MG tablet Generic drug:  spironolactone Take 25 mg by mouth daily.   ANORO ELLIPTA 62.5-25 MCG/INH Aepb Generic drug:  umeclidinium-vilanterol INHALE ONE PUFF BY  MOUTH ONCE DAILY   aspirin 81 MG chewable tablet Chew 81 mg by mouth daily.   b complex vitamins tablet Take 1 tablet by mouth 2 (two) times daily.   CVS D3 2000 units Caps Generic drug:  Cholecalciferol Take 2,000 Units by mouth daily.   cyclobenzaprine 5 MG tablet Commonly known as:  FLEXERIL Take 5 mg by mouth every 8 (  eight) hours as needed for muscle spasms.   diclofenac sodium 1 % Gel Commonly known as:  VOLTAREN Apply 2 g topically 2 (two) times daily as needed (pain).   famotidine 20 MG tablet Commonly known as:  PEPCID Take 20 mg by mouth at bedtime.   folic acid 1 MG tablet Commonly known as:  FOLVITE Take 1 mg by mouth daily.   gabapentin 300 MG capsule Commonly known as:  NEURONTIN Take 600 mg by mouth at bedtime.   levETIRAcetam 500 MG tablet Commonly known as:  KEPPRA Take 1 tablet (500 mg total) by mouth 2 (two) times daily.   multivitamin with minerals Tabs tablet Take 1 tablet by mouth daily.   naltrexone 50 MG tablet Commonly known as:  DEPADE Take 50 mg by mouth daily.   pantoprazole 40 MG tablet Commonly known as:  PROTONIX Take 40 mg by mouth daily.   thiamine 100 MG tablet Take 1 tablet (100 mg total) by mouth daily.   TOPROL XL 25 MG 24 hr tablet Generic drug:  metoprolol succinate Take 25 mg by mouth daily.      Follow-up Information    YAMAGATA,GLENN T, MD Follow up.   Specialty:  Interventional Radiology Why:  pt will hear from scheduler for time and date of follow up procedure; call [phone] if questions or concerns Contact information: [address] [place] [address] [phone]            Electronically Signed: Monia Sabal A 05/11/2016, 10:19 AM   I have spent Greater Than 30 Minutes discharging Wanda Prince.

## 2016-05-11 NOTE — Progress Notes (Signed)
Discussed with patient discharge instructions, she verbalized agreement and understanding.  Patient to go home with all belongings in private vehicle.

## 2016-05-13 IMAGING — MR MR ABDOMEN WO/W CM
6 of 16 series · 23 of 48 positions shown · IV contrast (eovist)
Comparison: MRI 10/13/2014 and 02/24/2015, CT 06/07/2015 and PET-CT
06/22/2015.

CLINICAL DATA: Follow up hepatic nodule. Cirrhosis. Hypermetabolic
right upper lobe lung mass on recent PET-CT.

Creatinine was obtained on site at [HOSPITAL] at [HOSPITAL].
Results: Creatinine 0.5 mg/dL.
EXAM:
MRI ABDOMEN WITHOUT AND WITH CONTRAST
TECHNIQUE: Multiplanar multisequence MR imaging of the abdomen was performed
both before and after the administration of intravenous contrast.
CONTRAST:  5 ml Eovist

[Series 6: T1 · axial · 5.0mm · 0.66mm/px · z∈[-136,+2]mm · 4 of 52 slices shown]
[im 1/52]
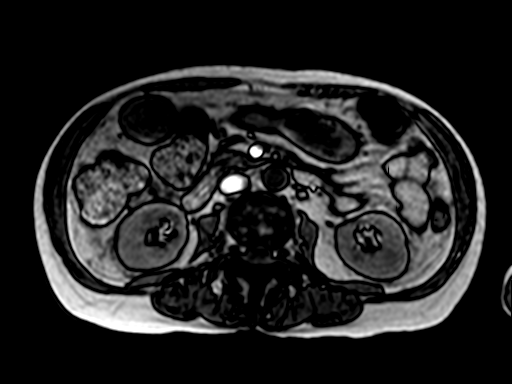
[im 18/52]
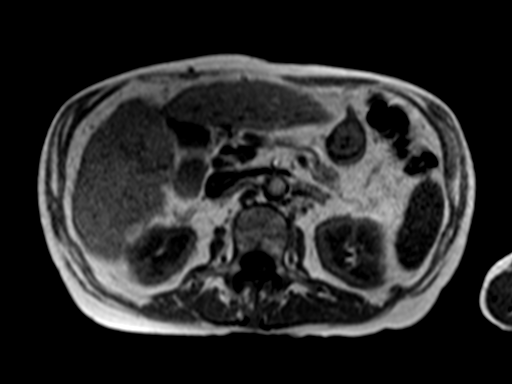
[im 35/52]
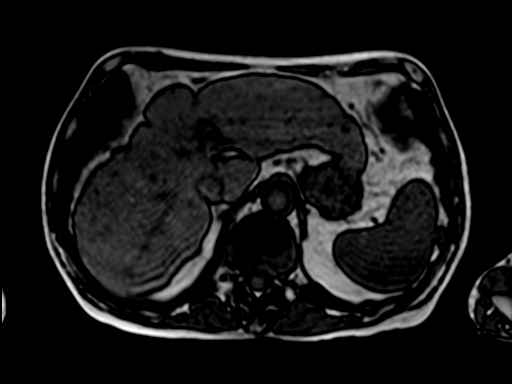
[im 52/52]
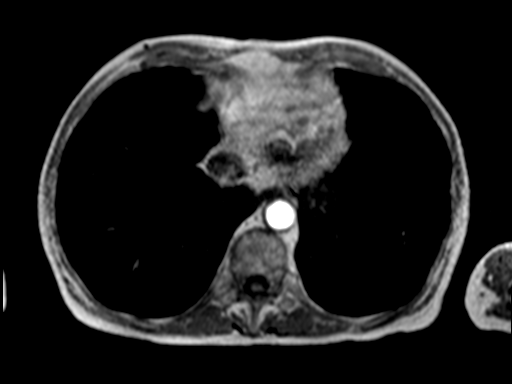

[Series 7: T1 dynamic · axial · non-contrast · 2.5mm · 0.66mm/px · z∈[-141,+7]mm · 4 of 60 slices shown]
[im 1/60]
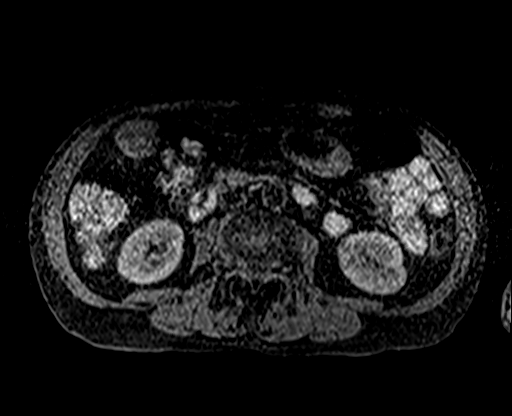
[im 20/60]
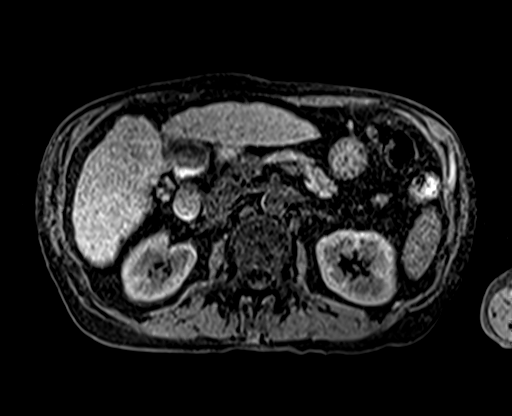
[im 40/60]
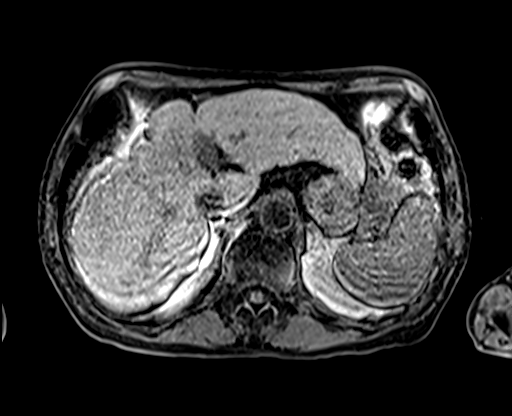
[im 60/60]
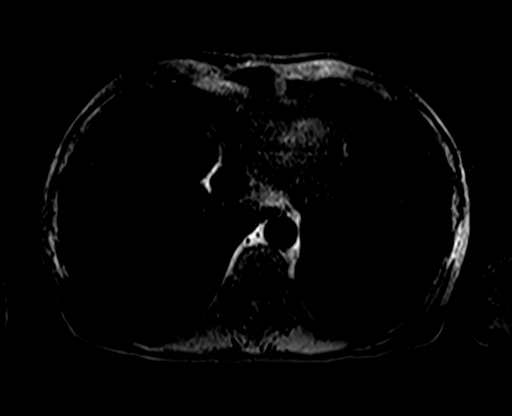

[Series 8: T1 dynamic post-contrast · axial · 2.5mm · 0.66mm/px · z∈[-141,+7]mm · 4 of 60 slices shown (1 of 4)]
[im 1/60]
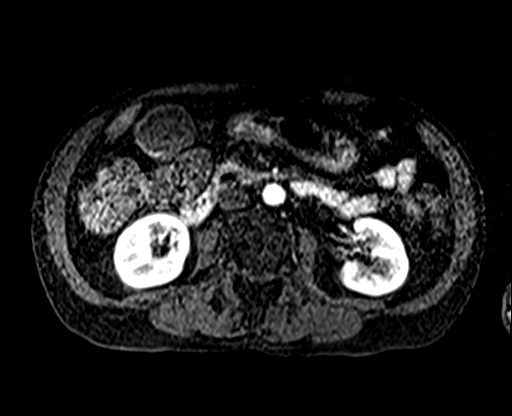
[im 20/60]
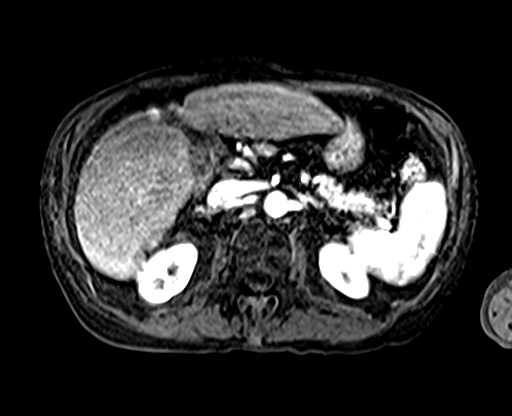
[im 40/60]
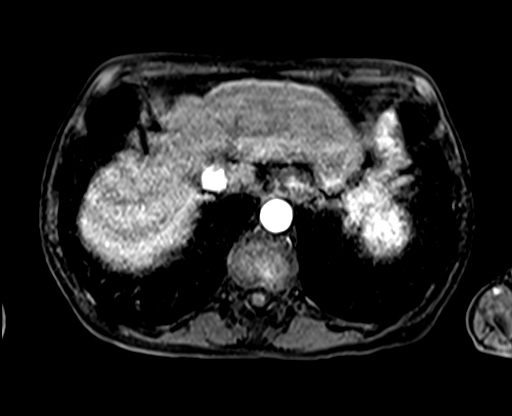
[im 60/60]
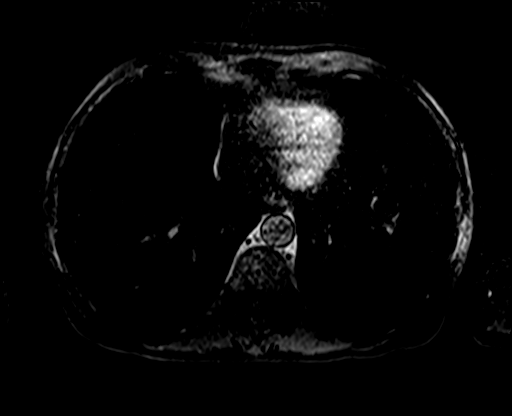

[Series 9: T1 dynamic post-contrast · axial · 2.5mm · 0.66mm/px · z∈[-141,+7]mm · 4 of 60 slices shown (2 of 4)]
[im 1/60]
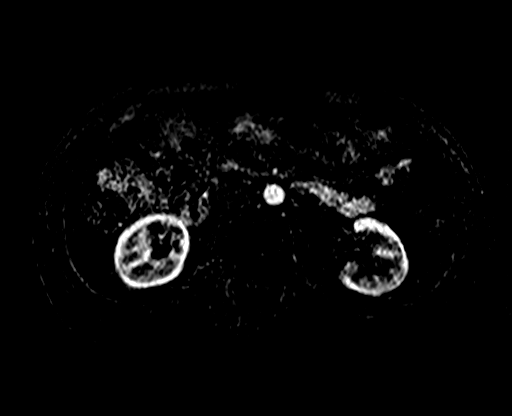
[im 20/60]
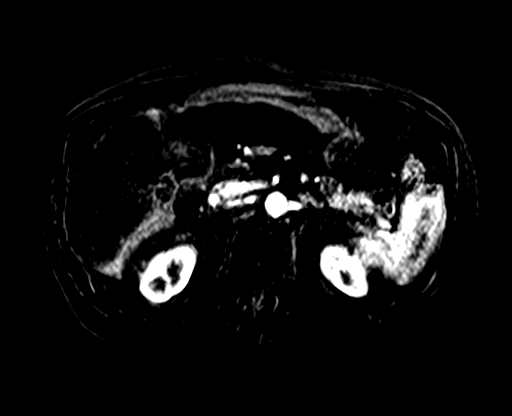
[im 40/60]
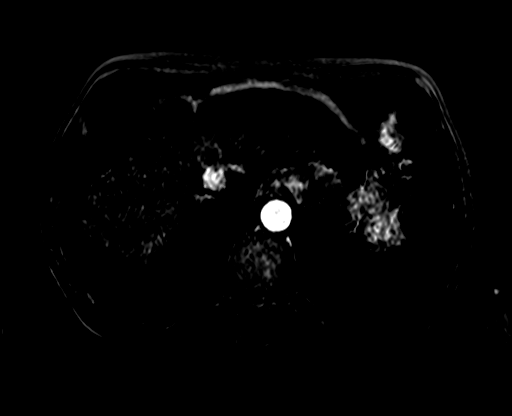
[im 60/60]
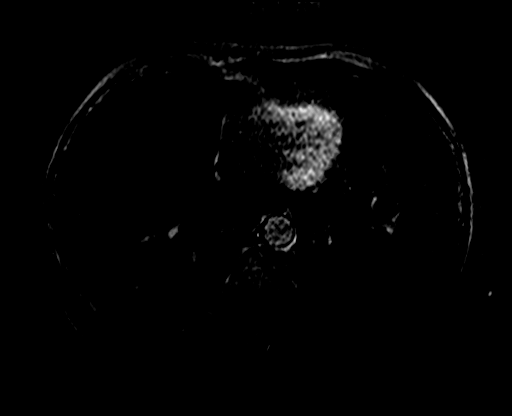

[Series 10: T1 dynamic post-contrast · axial · 2.5mm · 0.66mm/px · z∈[-141,+7]mm · 4 of 60 slices shown (3 of 4)]
[im 1/60]
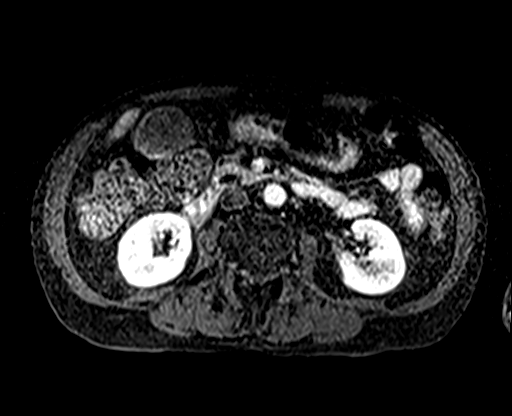
[im 20/60]
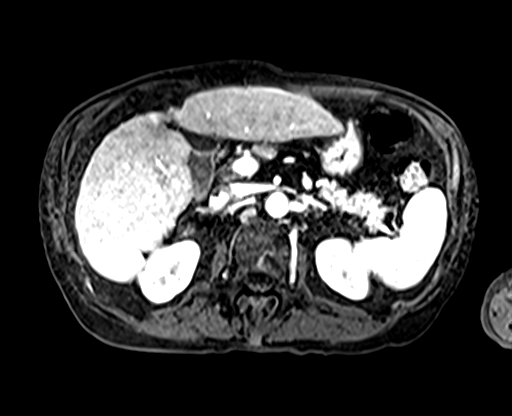
[im 40/60]
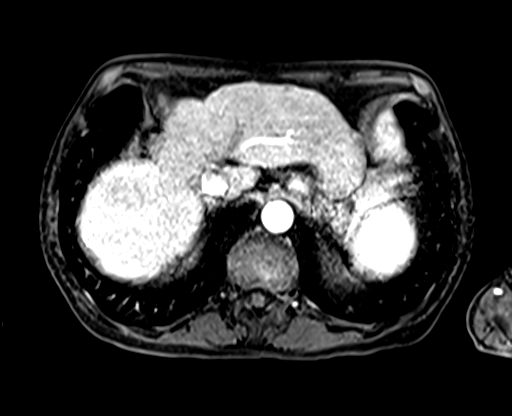
[im 60/60]
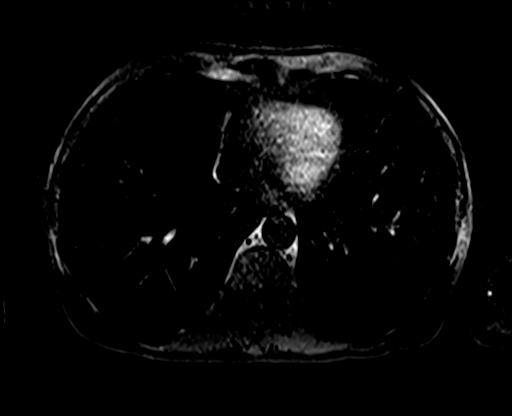

[Series 11: T1 dynamic post-contrast · axial · 2.5mm · 0.66mm/px · z∈[-141,-43]mm · 3 of 60 slices shown (4 of 4)]
[im 1/60]
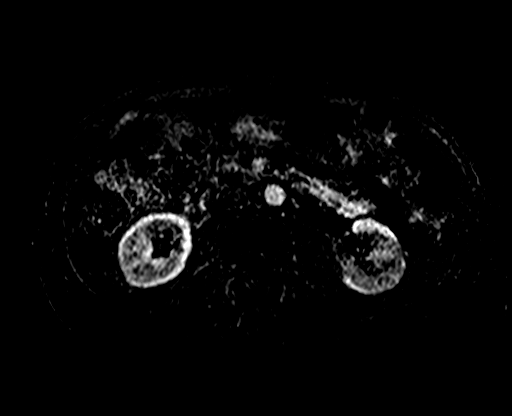
[im 20/60]
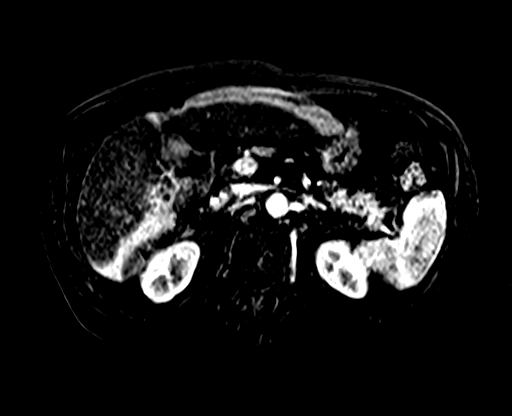
[im 40/60]
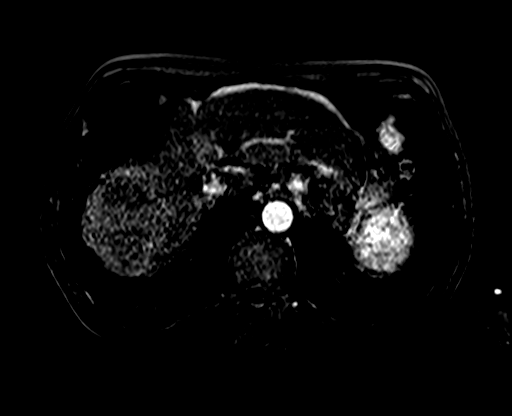

[23 of 48 positions shown; findings below may reference images not displayed]

FINDINGS: Lower chest: Clear lung bases. No significant pleural or pericardial
effusion.

Hepatobiliary: Morphologic changes of cirrhosis are again noted with
contour irregularity and relative enlargement of the left and
caudate lobes. Again noted is an enhancing subcapsular nodule
anteriorly in segment 5. This nodule is best seen on series 10,
measuring 11 x 8 mm on image 39. It demonstrates mild T2
hyperintensity, restricted diffusion and arterial phase enhancement.
The signal fades on the portal phase images, and the lesion is
hypointense to liver on the hepatocyte phase images. This lesion
appears slightly larger than on the baseline MRI of 10 months ago.
No other T2 hyperintense or arterial phase lesions are identified.
There are few hypovascular nodules throughout the liver which are
probably regenerating nodules. Dependent sludge or small gallstones
in the gallbladder lumen. No gallbladder wall thickening or biliary
dilatation.

Pancreas: Unremarkable. No pancreatic ductal dilatation or
surrounding inflammatory changes.

Spleen: Normal in size without focal abnormality. Stable small
splenule laterally.

Adrenals/Urinary Tract: Both adrenal glands appear normal. No
suspicious renal findings. Probable tiny renal cysts.

Stomach/Bowel: No evidence of bowel wall thickening, distention or
surrounding inflammatory change.

Vascular/Lymphatic: Stable mildly prominent lymph nodes within the
porta hepatis. No retroperitoneal adenopathy. No significant
vascular findings are present.

Other: No ascites.

Musculoskeletal: No acute or significant osseous findings.
IMPRESSION: 1. The previously demonstrated arterial phase enhancing, subcapsular
lesion in segment 5 of the liver appears slightly larger than on the
baseline MRI of 10 months ago. This could be a dysplastic nodule,
although is suspicious for a small hepatocellular carcinoma. The
patient's serum AFP is mildly elevated.
2. The liver otherwise appears stable with morphologic changes of
cirrhosis and probable scattered regenerating nodules. No typical
metastatic disease in this patient with a hypermetabolic right lung
nodule.
3. Stable prominent lymph nodes in the upper abdomen, likely related
to the patient's cirrhosis.

## 2016-05-21 ENCOUNTER — Ambulatory Visit
Admission: RE | Admit: 2016-05-21 | Discharge: 2016-05-21 | Disposition: A | Discharge status: Home / Self Care | Payer: Medicaid Other | Source: Ambulatory Visit | Attending: Radiation Oncology | Admitting: Radiation Oncology

## 2016-06-12 ENCOUNTER — Other Ambulatory Visit: Payer: Medicaid Other

## 2016-06-18 ENCOUNTER — Telehealth: Payer: Self-pay | Admitting: Internal Medicine

## 2016-06-18 NOTE — Telephone Encounter (Signed)
Called to get the fax sent to [place] again since this was not received.  They were not able to resend this but did give me the [phone] to call and do the PA over the phone.  Called and spoke with Clair Gulling--  Pt ID #[id number] S Pt has tried and failed spiriva   PA for the anora has been approved.  [phone] and this will be good through 06/13/2017.  I have called [place] and they are aware.

## 2016-06-21 ENCOUNTER — Telehealth: Payer: Self-pay | Admitting: *Deleted

## 2016-06-21 NOTE — Telephone Encounter (Signed)
CALLED PATIENT TO ASK ABOUT COMING IN FOR MISSED SCAN , PATIENT AGREED TO COME IN ON 06-25-16 - ARRIVAL TIME - 3:15 PM, PT. TO HAVE CLEAR LIQUIDS ONLY - 4 HRS. PRIOR TO TEST @ [facility] , PT. COULD NOT COME IN FOR  FU VISIT UNTIL 07-23-16 @ 1:30 PM WITH ALISON PERKINS DUE TO HER SISTER BRINGING HER

## 2016-06-24 ENCOUNTER — Ambulatory Visit
Admission: RE | Admit: 2016-06-24 | Discharge: 2016-06-24 | Disposition: A | Discharge status: Home / Self Care | Payer: Medicaid Other | Source: Ambulatory Visit | Attending: Radiation Oncology | Admitting: Radiation Oncology

## 2016-06-25 ENCOUNTER — Encounter (HOSPITAL_COMMUNITY): Payer: Self-pay

## 2016-06-25 ENCOUNTER — Ambulatory Visit (HOSPITAL_COMMUNITY)
Admission: RE | Admit: 2016-06-25 | Discharge: 2016-06-25 | Disposition: A | Discharge status: Home / Self Care | Payer: Medicaid Other | Source: Ambulatory Visit | Attending: Radiation Oncology | Admitting: Radiation Oncology

## 2016-06-25 DIAGNOSIS — I2510 Atherosclerotic heart disease of native coronary artery without angina pectoris: Secondary | ICD-10-CM | POA: Insufficient documentation

## 2016-06-25 DIAGNOSIS — R911 Solitary pulmonary nodule: Secondary | ICD-10-CM | POA: Insufficient documentation

## 2016-06-25 DIAGNOSIS — J439 Emphysema, unspecified: Secondary | ICD-10-CM | POA: Diagnosis not present

## 2016-06-25 DIAGNOSIS — I700 Atherosclerosis of aorta: Secondary | ICD-10-CM | POA: Insufficient documentation

## 2016-06-25 MED ORDER — IOPAMIDOL (ISOVUE-300) INJECTION 61%
75.0000 mL | Freq: Once | INTRAVENOUS | Status: AC | PRN
  Administered 2016-06-25: 75 mL via INTRAVENOUS

## 2016-06-25 MED ORDER — IOPAMIDOL (ISOVUE-300) INJECTION 61%
INTRAVENOUS | Status: AC
  Filled 2016-06-25: qty 75

## 2016-06-26 ENCOUNTER — Ambulatory Visit
Admission: RE | Admit: 2016-06-26 | Discharge: 2016-06-26 | Disposition: A | Discharge status: Home / Self Care | Payer: Medicaid Other | Source: Ambulatory Visit | Attending: Radiology | Admitting: Radiology

## 2016-06-26 DIAGNOSIS — C220 Liver cell carcinoma: Secondary | ICD-10-CM

## 2016-06-26 HISTORY — PX: IR GENERIC HISTORICAL: IMG1180011

## 2016-06-26 NOTE — Progress Notes (Signed)
Chief Complaint: Status post percutaneous thermal ablation of hepatocellular carcinoma on 05/10/2016.  History of Present Illness: Wanda Prince is a 61 y.o. female status post thermal ablation of an anterior subcapsular right lobe hepatocellular carcinoma on 05/10/2016. The procedure was complicated by a tiny right basilar pneumothorax that did not require any treatment. She has done well after the procedure and is without any complaints. She has cut back considerably on alcohol intake but continues to smoke. She denies fever.  Past Medical History:  Diagnosis Date  . 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 COPD (chronic obstructive pulmonary disease) ([place])   . H/O tracheostomy 04/22/2015   respiratory destress   . History of acute alcoholic hepatitis   . History of alcohol abuse   . History of cocaine abuse   . History of iron deficiency   . Hx of ventricular shunt 2009  . Hypertension   . Left breast mass   . Major depressive disorder    History of  . Meningitis 2009  . Primary cancer of right upper lobe of lung ([place]) 07/12/2015  . Psychiatric diagnosis    Multiple  . 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 Seizure disorder ([place])   . Seizures ([place])   . Suicide attempt    History of suicide attempts August 2006 and July 2006    Past Surgical History:  Procedure Laterality Date  . BACK SURGERY    . BRAIN SURGERY    . breast mass removal    . BREAST SURGERY    . CRANIOTOMY    . IR GENERIC HISTORICAL  02/28/2016   IR RADIOLOGIST EVAL & MGMT 02/28/2016 Aletta Edouard, MD [facility]  . IR GENERIC HISTORICAL  05/10/2016   IR VENIPUNCTURE 38YRS/OLDER BY MD [facility]  . IR GENERIC HISTORICAL  05/10/2016   IR US GUIDE VASC ACCESS RIGHT [facility]  . LEFT HEART CATHETERIZATION WITH CORONARY ANGIOGRAM N/A  06/06/2011   Procedure: LEFT HEART CATHETERIZATION WITH CORONARY ANGIOGRAM;  Surgeon: Burnell Blanks, MD;  Location: [facility] CATH LAB: Moderate ~ 40% RCA disease noted. Otherwise normal  . TRANSTHORACIC ECHOCARDIOGRAM  05/2011   Insetting of seizure, positive troponin. EF 30-35% with anterior hypokinesis.  . TRANSTHORACIC ECHOCARDIOGRAM  12/2013   EF 60-65%. GR 1 DD. No significant valvular disease.    Allergies: Patient has no known allergies.  Medications: Prior to Admission medications   Medication Sig Start Date End Date Taking? Authorizing Provider  acetaminophen (TYLENOL) 500 MG tablet Take 1,000 mg by mouth every 6 (six) hours as needed for pain.    Historical Provider, MD  albuterol (PROVENTIL HFA;VENTOLIN HFA) 108 (90 BASE) MCG/ACT inhaler Inhale 2 puffs into the lungs every 4 (four) hours as needed for wheezing or shortness of breath.     Historical Provider, MD  albuterol (PROVENTIL) (2.5 MG/3ML) 0.083% nebulizer solution Take 3 mLs (2.5 mg total) by nebulization every 4 (four) hours as needed for wheezing or shortness of breath. 03/18/16   Tanda Rockers, MD  ANORO ELLIPTA 62.5-25 MCG/INH AEPB INHALE ONE PUFF BY MOUTH ONCE DAILY 03/12/16   Tanda Rockers, MD  aspirin 81 MG chewable tablet Chew 81 mg by mouth daily.    Historical Provider, MD  b complex vitamins tablet Take 1 tablet by mouth 2 (two) times daily.  01/28/12   Eugenie Filler, MD  Cholecalciferol (CVS D3) 2000 units CAPS Take 2,000 Units by mouth daily.    Historical Provider, MD  cyclobenzaprine (FLEXERIL) 5 MG tablet Take 5 mg by mouth every 8 (eight) hours as needed for muscle spasms.  11/22/13   Historical Provider, MD  diclofenac sodium (VOLTAREN) 1 % GEL Apply 2 g topically 2 (two) times daily as needed (pain).    Historical Provider, MD  famotidine (PEPCID) 20 MG tablet Take 20 mg by mouth at bedtime.    Historical Provider, MD  folic acid (FOLVITE) 1 MG tablet Take 1 mg by mouth daily.    Historical Provider,  MD  gabapentin (NEURONTIN) 300 MG capsule Take 600 mg by mouth at bedtime.     Historical Provider, MD  levETIRAcetam (KEPPRA) 500 MG tablet Take 1 tablet (500 mg total) by mouth 2 (two) times daily. 01/28/12   Eugenie Filler, MD  metoprolol succinate (TOPROL XL) 25 MG 24 hr tablet Take 25 mg by mouth daily.  09/30/13   Historical Provider, MD  Multiple Vitamin (MULITIVITAMIN WITH MINERALS) TABS Take 1 tablet by mouth daily. 06/07/11   Dorian Heckle, MD  naltrexone (DEPADE) 50 MG tablet Take 50 mg by mouth daily.    Historical Provider, MD  pantoprazole (PROTONIX) 40 MG tablet Take 40 mg by mouth daily.    Historical Provider, MD  spironolactone (ALDACTONE) 25 MG tablet Take 25 mg by mouth daily.  12/08/13 04/23/16  Historical Provider, MD  thiamine 100 MG tablet Take 1 tablet (100 mg total) by mouth daily. 06/07/11   Dorian Heckle, MD     Family History  Problem Relation Age of Onset  . Coronary artery disease Father     Social History   Social History  . Marital status: Legally Separated    Spouse name: N/A  . Number of children: N/A  . Years of education: N/A   Social History Main Topics  . Smoking status: Current Every Day Smoker    Packs/day: 1.00    Years: 40.00    Types: Cigarettes  . Smokeless tobacco: Never Used  . Alcohol use 4.8 oz/week    8 Cans of beer per week     Comment: per week  . Drug use: Yes    Types: Cocaine     Comment: not in years  . Sexual activity: No   Other Topics Concern  . Not on file   Social History Narrative  . No narrative on file    ECOG Status: 0 - Asymptomatic  Review of Systems: A 12 point ROS discussed and pertinent positives are indicated in the HPI above.  All other systems are negative.  Review of Systems  Constitutional: Negative.   Respiratory: Negative.   Cardiovascular: Negative.   Gastrointestinal: Negative.   Genitourinary: Negative.   Musculoskeletal: Negative.   Neurological: Negative.   All other systems  reviewed and are negative.   Vital Signs: BP (!) 141/67 (BP Location: Left Arm, Patient Position: Sitting, Cuff Size: Normal)   Pulse 74   Temp 97.7 F (36.5 C) (Oral)   Resp 16   Ht '5\' 8"'$  (1.727 m)   Wt 110 lb (49.9 kg)   LMP  (LMP Unknown)   SpO2 95%   BMI 16.73 kg/m   Physical Exam  Constitutional: She is oriented to person, place, and time. No distress.  Abdominal: Soft. She exhibits no distension. There is no tenderness.  Neurological: She is alert and oriented  to person, place, and time.  Skin: She is not diaphoretic.     Imaging: Ct Chest W Contrast  Result Date: 06/25/2016 CLINICAL DATA:  History of lung cancer. EXAM: CT CHEST WITH CONTRAST TECHNIQUE: Multidetector CT imaging of the chest was performed during intravenous contrast administration. CONTRAST:  20m ISOVUE-300 IOPAMIDOL (ISOVUE-300) INJECTION 61% COMPARISON:  11/03/2015 FINDINGS: Cardiovascular: The heart size appears normal. No pericardial effusion. Aortic atherosclerosis noted. Calcification in the RCA, LAD and left circumflex coronary artery noted. No pericardial effusion. Mediastinum/Nodes: The trachea appears patent and is midline. Normal appearance of the esophagus. Or hilar lymph nodes. No axillary or supraclavicular adenopathy. Lungs/Pleura: No pleural effusion. Moderate to advanced changes of centrilobular emphysema identified. Index left upper lobe nodule measures 1 cm, image 49 of series 5. Previously 1.1 cm peer the cavitary lesion within the right upper lobe Measures 1.1 by 1.4 cm, image 50 of series 5. Previously 1.5 x 1.6 cm. No new pulmonary nodules or masses. Upper Abdomen: Involving the right lobe of liver is identified measuring 3.7 by 3.7 cm, image 158 of series 2. No acute abnormality identified within the upper abdomen. Musculoskeletal: No aggressive lytic or sclerotic bone lesions identified. IMPRESSION: 1. Slight decrease in size of bilateral upper lobe pulmonary lesions. 2. No new or progressive  disease identified within the chest. 3. 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 4. Aortic atherosclerosis and 3 vessel coronary artery calcification 5. Emphysema Electronically Signed   By: TKerby MoorsM.D.   On: 06/25/2016 20:01    Labs:  CBC:  Recent Labs  04/30/16 1526 05/10/16 0722 05/11/16 0554  WBC 5.9 6.0 12.1*  HGB 14.1 14.1 12.9  HCT 42.1 40.8 39.2  PLT 123* 104* 104*    COAGS:  Recent Labs  04/30/16 1526  INR 1.13  APTT 34    BMP:  Recent Labs  11/03/15 1140 04/30/16 1526 05/11/16 0554  NA  --  141 139  K  --  4.9 4.3  CL  --  105 105  CO2  --  30 29  GLUCOSE  --  116* 88  BUN 10.'3 13 14  '$ CALCIUM  --  9.0 8.3*  CREATININE 0.7 0.45 0.60  GFRNONAA  --  >60 >60  GFRAA  --  >60 >60    LIVER FUNCTION TESTS:  Recent Labs  04/30/16 1526 05/11/16 0554  BILITOT 0.8 0.7  AST 68* 156*  ALT 61* 74*  ALKPHOS 102 94  PROT 6.5 5.2*  ALBUMIN 3.5 2.9*    Assessment and Plan:  Ms. BReadeis doing well after ablation of a hepatocellular carcinoma.  I recommended repeat MRI of the abdomen in mid to late March.  Electronically Signed:Aletta EdouardT 06/26/2016, 4:31 PM   I spent a total of 15 Minutes in face to face in clinical consultation, greater than 50% of which was counseling/coordinating care post ablation of a hepatocellular carcinoma.

## 2016-06-27 ENCOUNTER — Other Ambulatory Visit (HOSPITAL_COMMUNITY): Payer: Self-pay | Admitting: Interventional Radiology

## 2016-06-27 ENCOUNTER — Other Ambulatory Visit: Payer: Self-pay | Admitting: Radiation Oncology

## 2016-06-27 ENCOUNTER — Telehealth: Payer: Self-pay | Admitting: Radiation Oncology

## 2016-06-27 DIAGNOSIS — C3411 Malignant neoplasm of upper lobe, right bronchus or lung: Secondary | ICD-10-CM

## 2016-06-27 DIAGNOSIS — C220 Liver cell carcinoma: Secondary | ICD-10-CM

## 2016-06-27 NOTE — Telephone Encounter (Signed)
Phoned patient per Wanda Prince order and explained her 2/27  CT chest "looked good." Explained her 3/27 follow up has been cancelled and that a scheduler will be in contact with a follow up appointment and scan for six months out. Patient verbalized understanding of all reviewed.

## 2016-06-27 NOTE — Progress Notes (Signed)
DoneEnid Derry please call patient with next scan appointment and follow up. Thank you. Sam

## 2016-06-27 NOTE — Telephone Encounter (Signed)
-----   Message from Hayden Pedro, [place] sent at 06/27/2016  9:37 AM EST ----- Can you let her know scan looks good and we don't need to see her back on 3/27, rather we can see her in 6 months for follow up. I'll order CT ----- Message ----- From: Interface, Rad Results In Sent: 06/25/2016   8:03 PM To: Hayden Pedro, PA-C

## 2016-07-05 ENCOUNTER — Encounter: Payer: Self-pay | Admitting: Interventional Radiology

## 2016-07-18 ENCOUNTER — Other Ambulatory Visit: Payer: Self-pay | Admitting: *Deleted

## 2016-07-19 ENCOUNTER — Other Ambulatory Visit: Payer: Self-pay | Admitting: *Deleted

## 2016-07-19 DIAGNOSIS — C220 Liver cell carcinoma: Secondary | ICD-10-CM

## 2016-07-19 IMAGING — DX DG CHEST 2V
2 series · 2 of 2 positions shown · non-contrast
Comparison: Chest x-ray of May 03, 2015

CLINICAL DATA: Shortness of breath, history of COPD, smoking, right
upper lobe lung malignancy.

EXAM:
CHEST  2 VIEW

[chest pa]
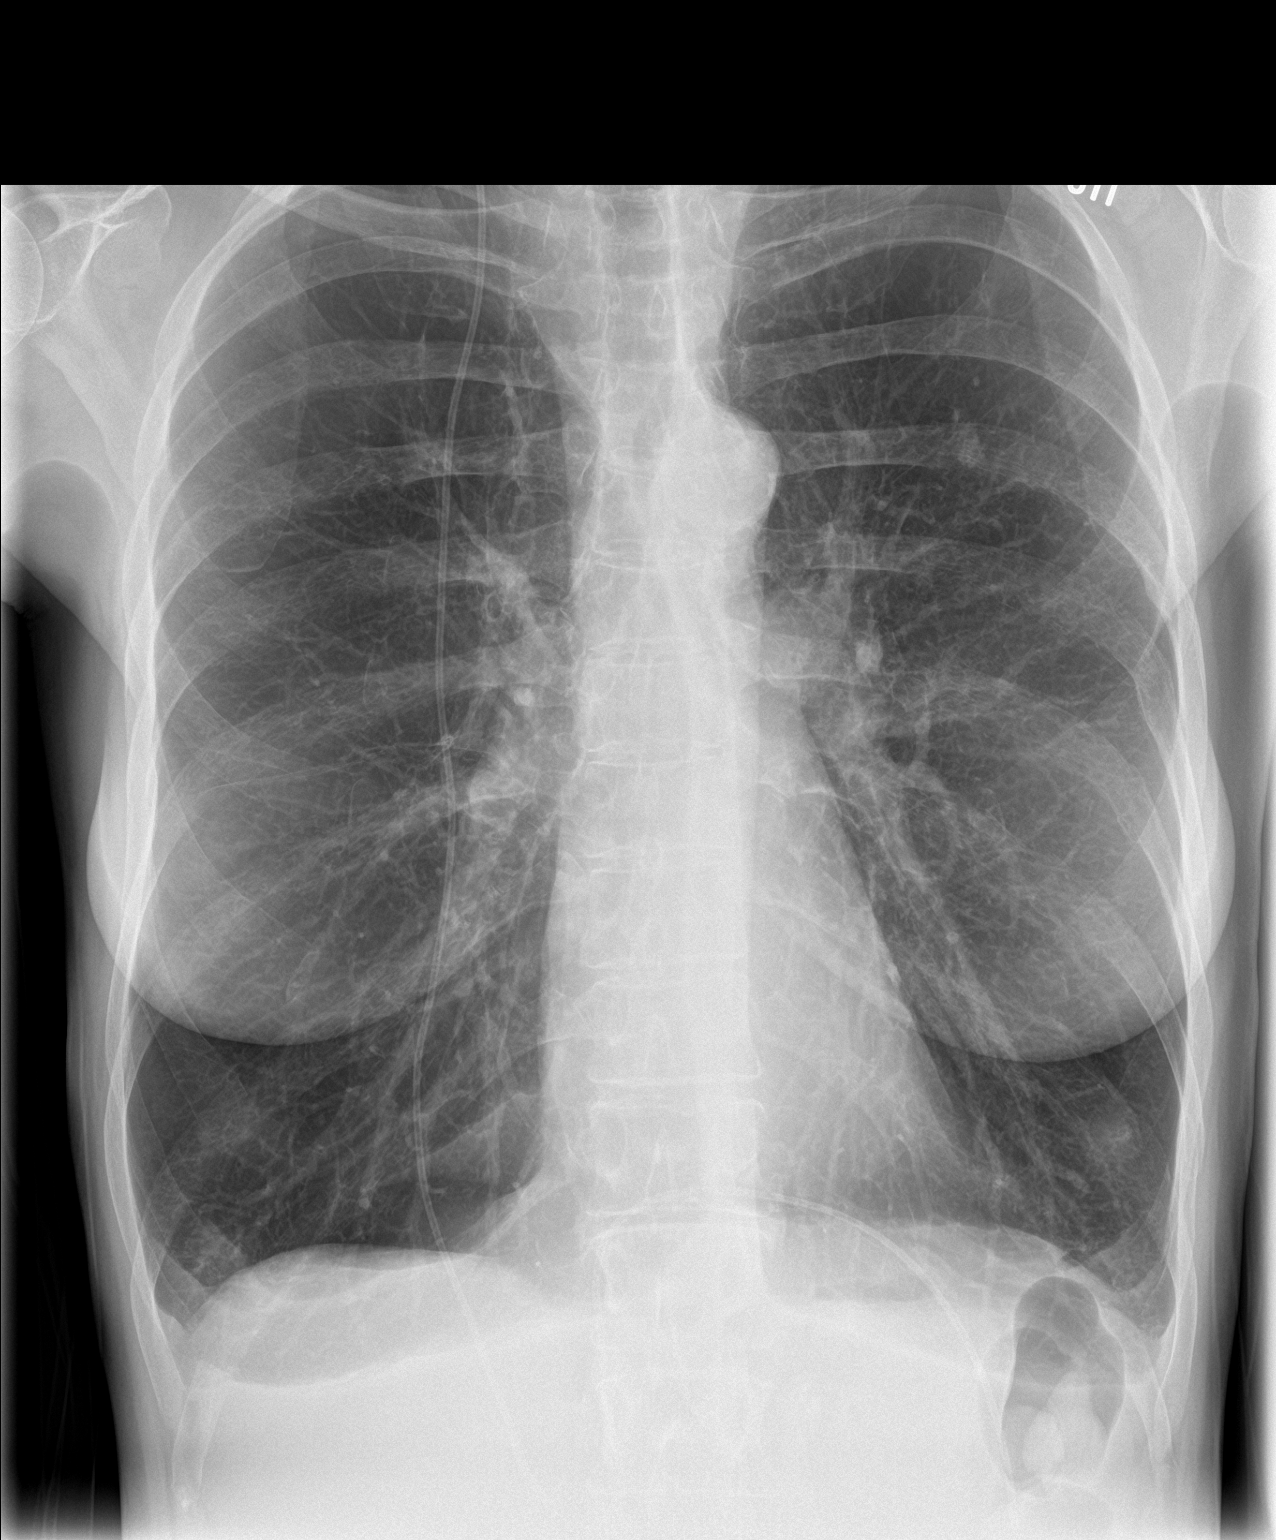

[chest lat]
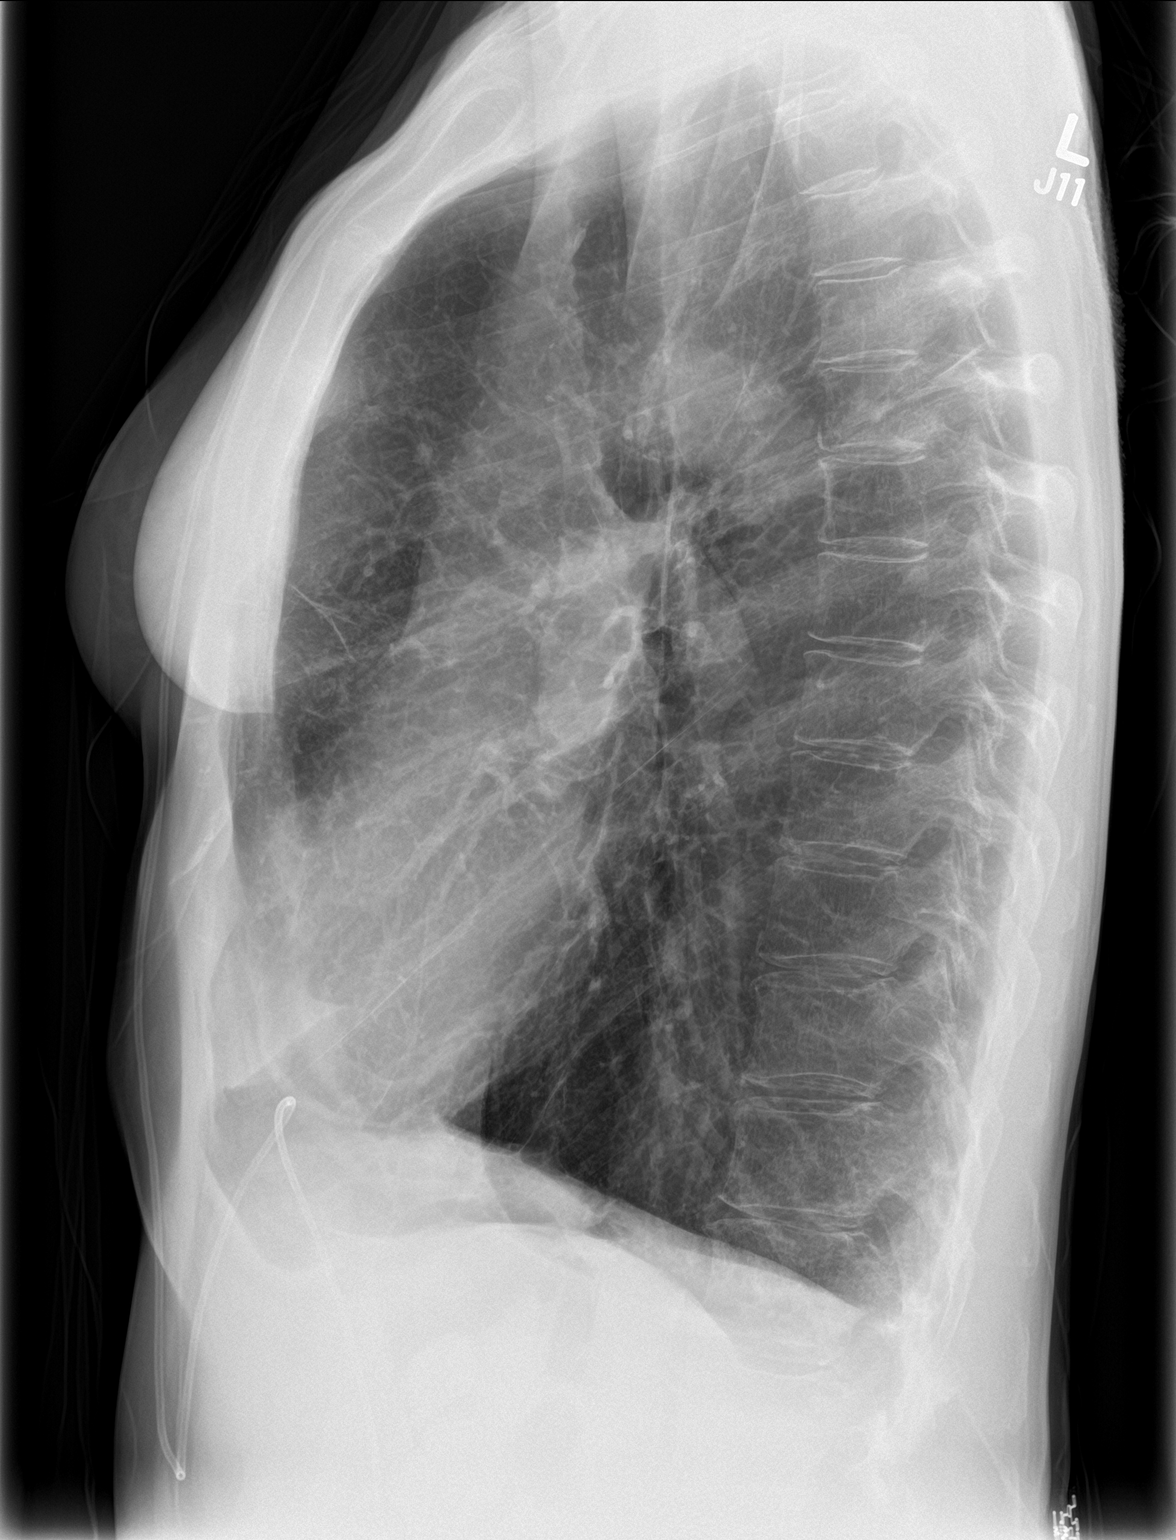

[2 of 2 positions shown; findings below may reference images not displayed]

FINDINGS: The lungs remain hyperinflated with hemidiaphragm flattening. There
is increased conspicuity of an approximately 8 x 12 mm nodule
projecting over the posterior aspect of the left sixth rib. There is
no alveolar infiltrate. There is no significant pleural effusion.
The heart and pulmonary vascularity are normal. There is
calcification in the wall of the aortic arch. There is a
ventriculoperitoneal shunt tube in place. The bony structures are
unremarkable.
IMPRESSION: 1. COPD. New 8 x 12 mm nodule projecting in the left upper lobe.
There is no alveolar pneumonia.
2. No CHF.  Aortic atherosclerosis.

## 2016-07-23 ENCOUNTER — Encounter (HOSPITAL_COMMUNITY): Payer: Self-pay | Admitting: Interventional Radiology

## 2016-07-23 ENCOUNTER — Ambulatory Visit: Payer: Self-pay | Admitting: Radiation Oncology

## 2016-07-29 IMAGING — CT CT CHEST W/ CM
2 of 3 series · 15 of 36 positions shown, 18 images · IV contrast (ISOVUE)
Comparison: Chest radiographs 10/24/2015. PET-CT 06/22/2015. Chest
CT 05/05/2015 and 07/26/2005.

CLINICAL DATA: History of cavitary right upper lobe lesion,
originally felt to reflect lung cancer, although subsequently felt
to represent tuberculosis. Possible new left upper lobe nodule on
chest radiographs.

EXAM:
CT CHEST WITH CONTRAST
TECHNIQUE: Multidetector CT imaging of the chest was performed during
intravenous contrast administration.
CONTRAST:  75mL SII87V-HYY IOPAMIDOL (SII87V-HYY) INJECTION 61%

[Series 2: chest with st · axial · 0.66mm/px · z∈[-336,-32]mm · 12 of 180 slices shown, 15 images]
[im 14/180  mediastinal]
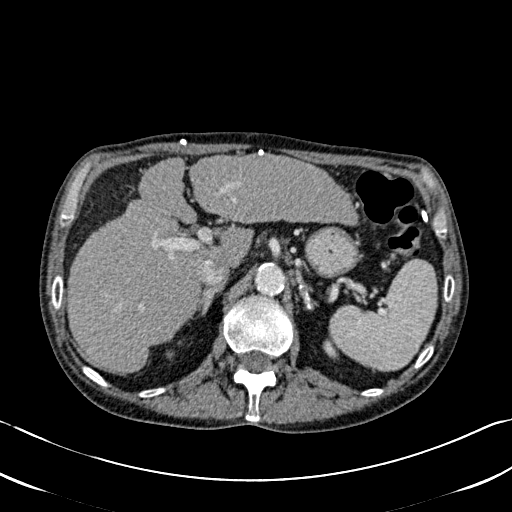
[im 14/180  lung]
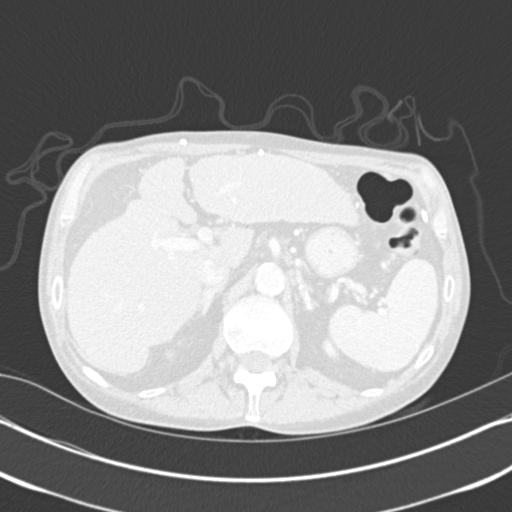
[im 27/180  lung]
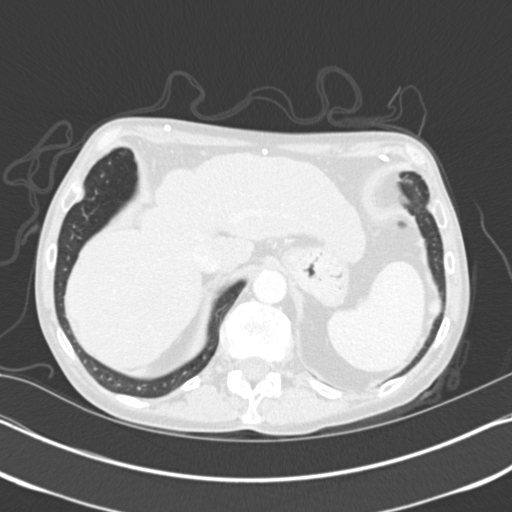
[im 40/180  lung]
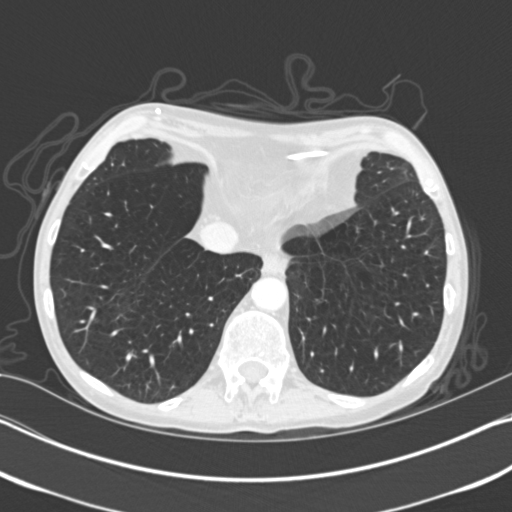
[im 54/180  lung]
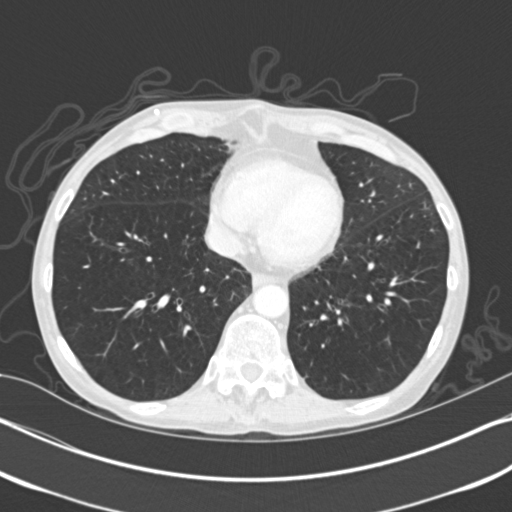
[im 67/180  mediastinal]
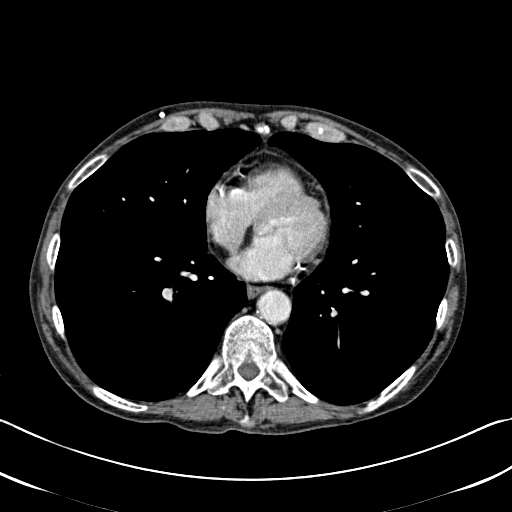
[im 67/180  lung]
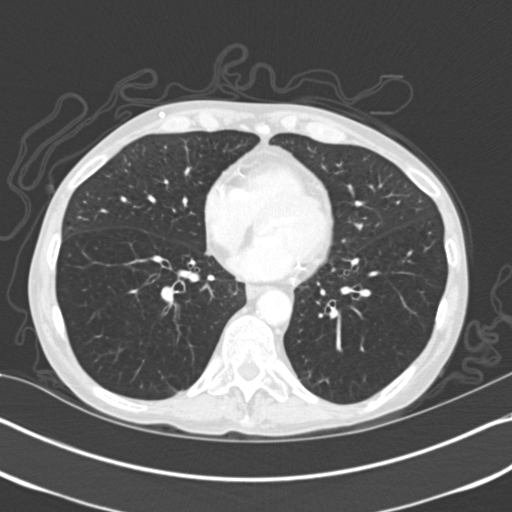
[im 80/180  lung]
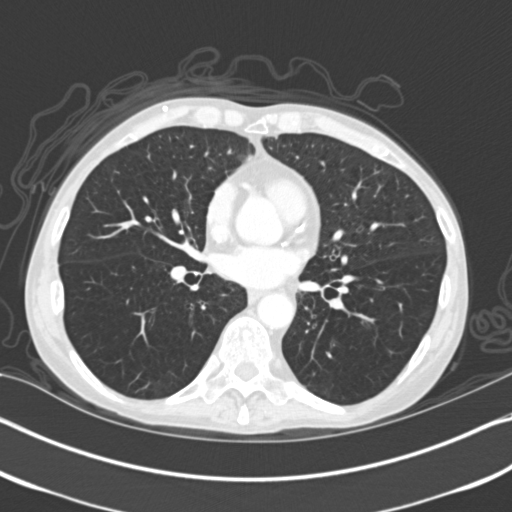
[im 100/180  lung]
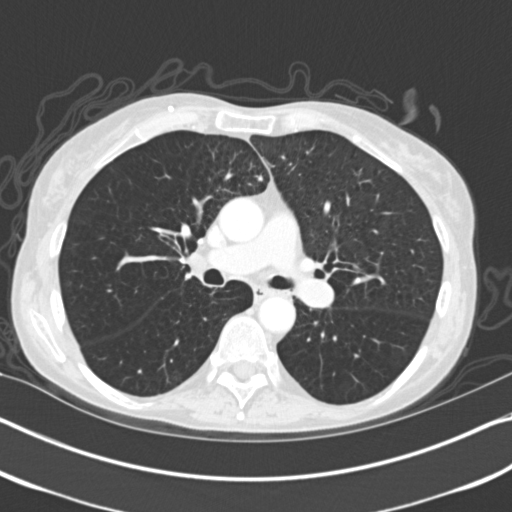
[im 113/180  lung]
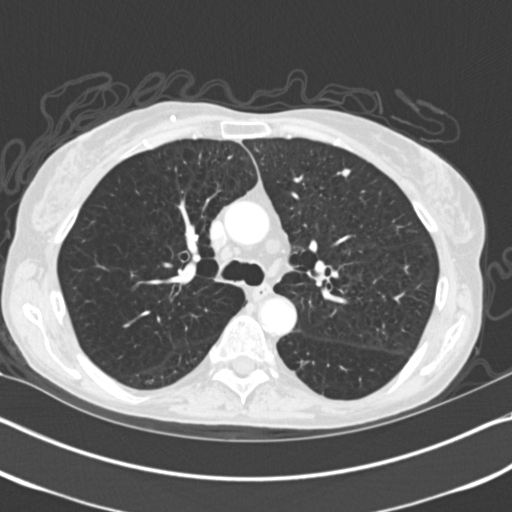
[im 126/180  mediastinal]
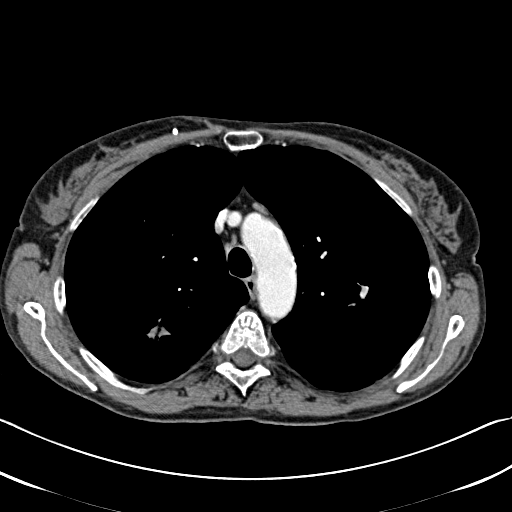
[im 126/180  lung]
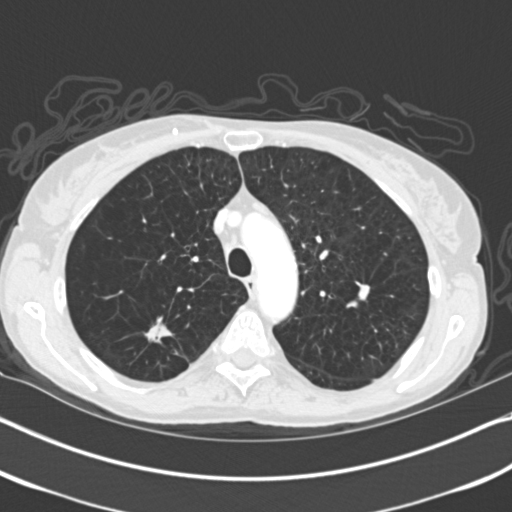
[im 140/180  lung]
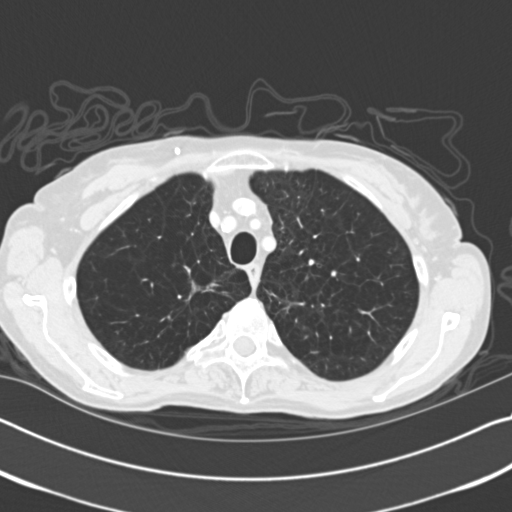
[im 153/180  lung]
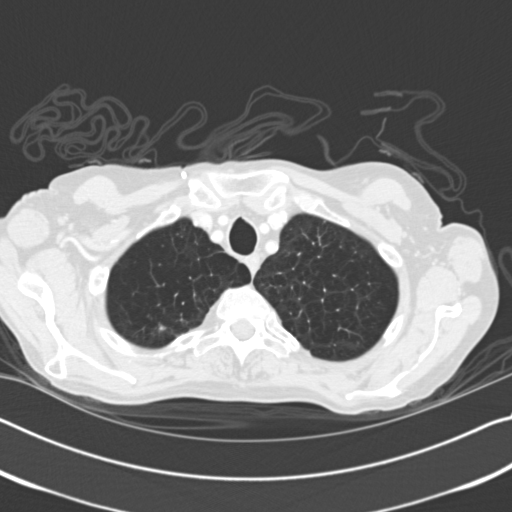
[im 166/180  lung]
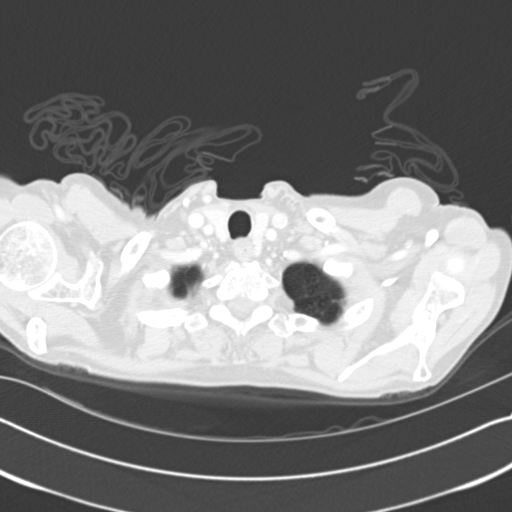

[Series 602: <mpr thick range> · coronal · 0.70mm/px · 3 of 111 slices shown]
[im 23/111  lung]
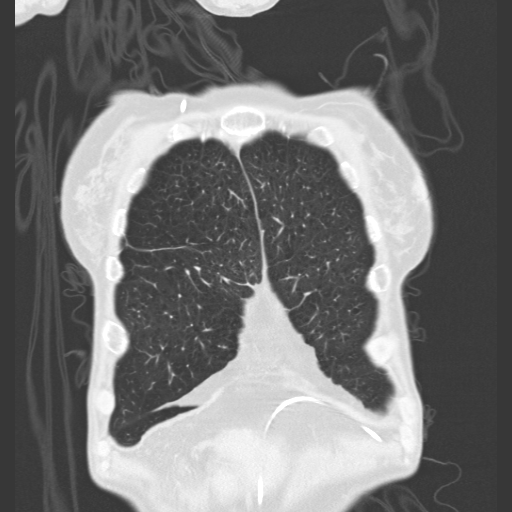
[im 45/111  lung]
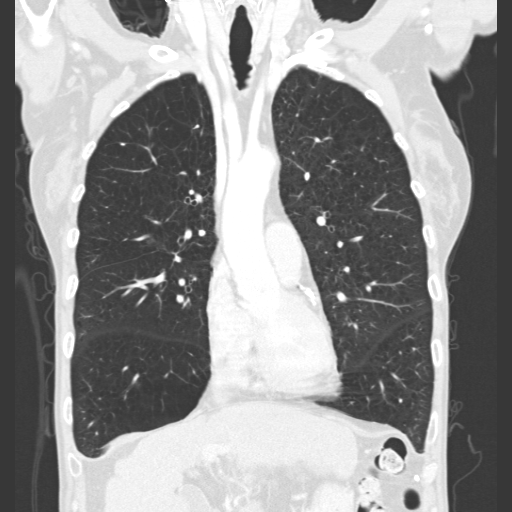
[im 67/111  lung]
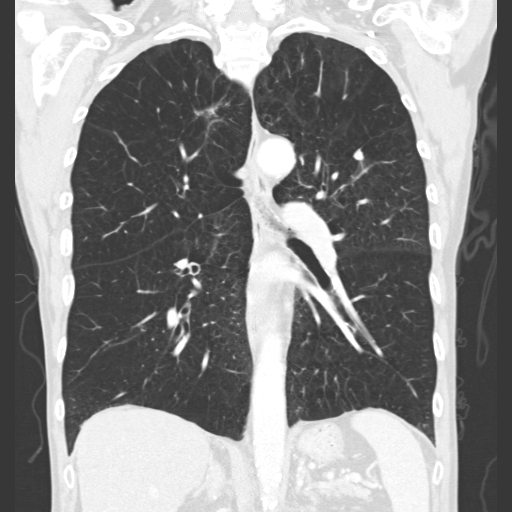

[15 of 36 positions shown; findings below may reference images not displayed]

FINDINGS: Cardiovascular: No acute vascular findings are seen. There is
atherosclerosis of the aorta, great vessels and coronary arteries.
The heart size is normal. There is no pericardial effusion.

Mediastinum/Nodes: There are no enlarged mediastinal, hilar or
axillary lymph nodes. The thyroid gland, trachea and esophagus
demonstrate no significant findings.

Lungs/Pleura: There is no pleural effusion. The centrally cavitary
and somewhat spiculated right upper lobe lesion has not
significantly changed in size, measuring 1.5 x 1.6 cm on image 56.
The left upper lobe lesion questioned on the recent radiographs
corresponds with a 7 x 11 mm nodule on image 55. This is
perivascular in location and unchanged from the CT and PET CT of
earlier this year. It is new from 3336. It was not hypermetabolic on
PET-CT. There is a stable 6 mm nodule in the superior segment of the
left lower lobe on image 72. No other new or enlarging pulmonary
nodules are identified. There is moderate emphysema with mild
central airway thickening and scattered pulmonary scarring.

Upper abdomen: There is diffuse contour irregularity of the liver
consistent with cirrhosis. There are several low-density hepatic
lesions, measuring up to 7 mm in the lateral segment of left lobe on
image 56. The liver is incompletely imaged. Upper abdominal ascites
noted previously is no longer seen. There is no adrenal mass.

Musculoskeletal/Chest wall: There is no chest wall mass or
suspicious osseous finding. For rib fractures are present
bilaterally per ventriculoperitoneal shunt is noted within the right
anterior chest wall.
IMPRESSION: 1. The cavitary right upper lobe lesion has not significantly
changed from the previous CT of 6 months ago. This could reflect
cavitary lung cancer or tuberculosis.
2. There are small left upper and lower lobe pulmonary nodules which
are also stable from the prior CT of 6 months ago.
3. No acute findings or adenopathy.
4. If surgical intervention not again contemplated, continued chest
CT follow-up in 6 months recommended.

## 2016-07-31 ENCOUNTER — Ambulatory Visit: Payer: Medicaid Other

## 2016-07-31 ENCOUNTER — Ambulatory Visit (HOSPITAL_COMMUNITY)
Admission: RE | Admit: 2016-07-31 | Discharge: 2016-07-31 | Disposition: A | Discharge status: Home / Self Care | Payer: Medicaid Other | Source: Ambulatory Visit | Attending: Interventional Radiology | Admitting: Interventional Radiology

## 2016-07-31 DIAGNOSIS — Z9889 Other specified postprocedural states: Secondary | ICD-10-CM | POA: Diagnosis not present

## 2016-07-31 DIAGNOSIS — K769 Liver disease, unspecified: Secondary | ICD-10-CM | POA: Diagnosis not present

## 2016-07-31 DIAGNOSIS — C220 Liver cell carcinoma: Secondary | ICD-10-CM | POA: Insufficient documentation

## 2016-07-31 LAB — CREATININE, SERUM
Creatinine, Ser: 0.65 mg/dL (ref 0.44–1.00)
GFR calc Af Amer: >60 mL/min (ref >60)
GFR calc non Af Amer: >60 mL/min (ref >60)

## 2016-07-31 MED ORDER — GADOXETATE DISODIUM 0.25 MMOL/ML IV SOLN
10.0000 mL | Freq: Once | INTRAVENOUS | Status: AC
  Administered 2016-07-31: 5 mL via INTRAVENOUS

## 2016-08-14 ENCOUNTER — Telehealth: Payer: Self-pay | Admitting: Radiology

## 2016-08-14 NOTE — Telephone Encounter (Signed)
Call to patient's sister, Vickii Chafe, to reschedule follow up appointment which patient missed on 07/31/2016.    Vickii Chafe states that she does not feel that Ms. Cressy needs to follow up with both Dr. Kathlene Cote and Dr. Paulita Fujita.  Patient has follow up appointment with Dr. Paulita Fujita scheduled in 2 weeks.   Peggy plans to discuss future follow ups with Dr Paulita Fujita and states that she will not take patient for follow ups with both.  States that she will contact our office should she decide to follow up with Dr. Kathlene Cote.  Lachrisha Ziebarth Riki Rusk, [place] 08/14/2016 2:13 PM

## 2016-09-02 DIAGNOSIS — R636 Underweight: Secondary | ICD-10-CM | POA: Diagnosis present

## 2016-09-26 ENCOUNTER — Other Ambulatory Visit: Payer: Self-pay | Admitting: Family Medicine

## 2016-09-26 DIAGNOSIS — Z1231 Encounter for screening mammogram for malignant neoplasm of breast: Secondary | ICD-10-CM

## 2016-10-09 ENCOUNTER — Other Ambulatory Visit: Payer: Self-pay | Admitting: Internal Medicine

## 2016-10-11 IMAGING — MR MR ABDOMEN WO/W CM
7 of 17 series · 19 of 48 positions shown · IV contrast (multihance)
Comparison: MRI 08/18/2015 and PET-CT 06/22/2015

CLINICAL DATA: Followup hepatic lesion demonstrated on prior MRI.
History of lung cancer and cirrhosis.

EXAM:
MRI ABDOMEN WITHOUT AND WITH CONTRAST
TECHNIQUE: Multiplanar multisequence MR imaging of the abdomen was performed
both before and after the administration of intravenous contrast.
CONTRAST:  18 cc MultiHance

[Series 4: T2 · coronal · 5.0mm · 1.41mm/px · 1 of 29 slices shown]
[im 1/29]
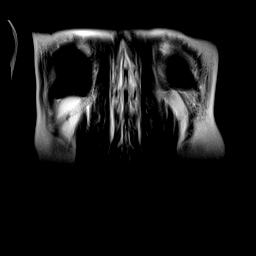

[Series 5: axial in out · axial · 6.0mm · 0.68mm/px · 1 of 60 slices shown]
[im 1/60]
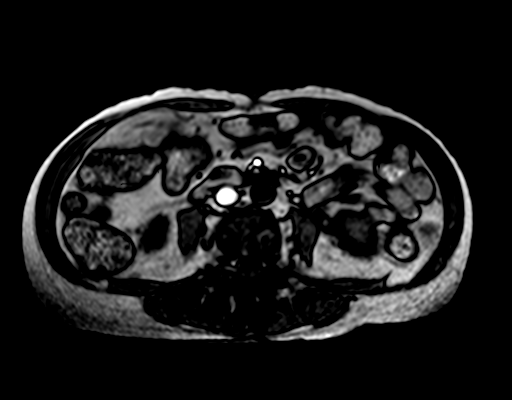

[Series 6: T1 dynamic · axial · non-contrast · 2.2mm · 0.72mm/px · z∈[-120,+72]mm · 3 of 88 slices shown]
[im 1/88]
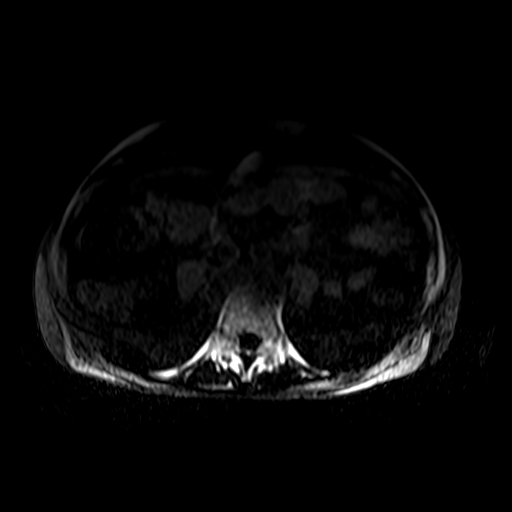
[im 44/88]
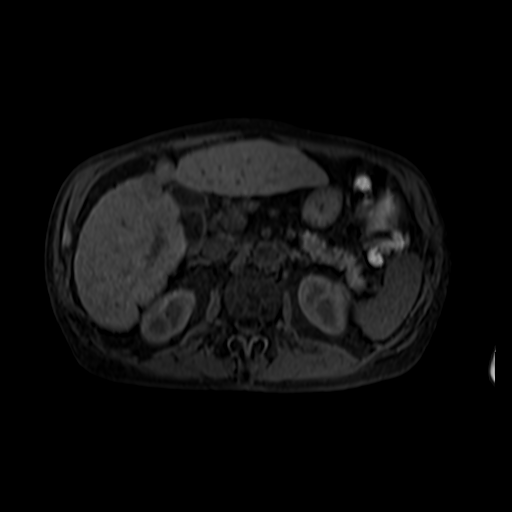
[im 88/88]
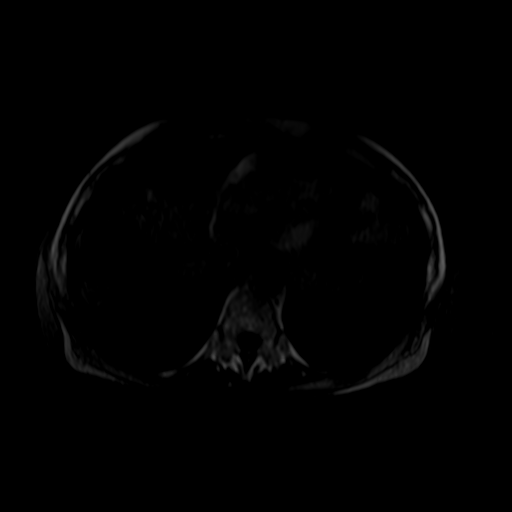

[Series 7: post immed · axial · 2.2mm · 0.72mm/px · z∈[-120,+72]mm · 3 of 88 slices shown]
[im 1/88]
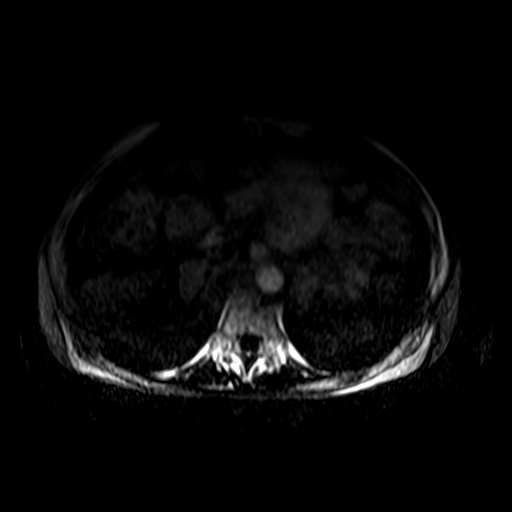
[im 44/88]
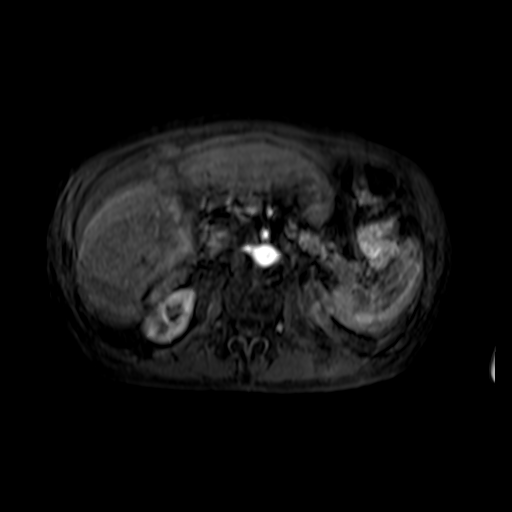
[im 88/88]
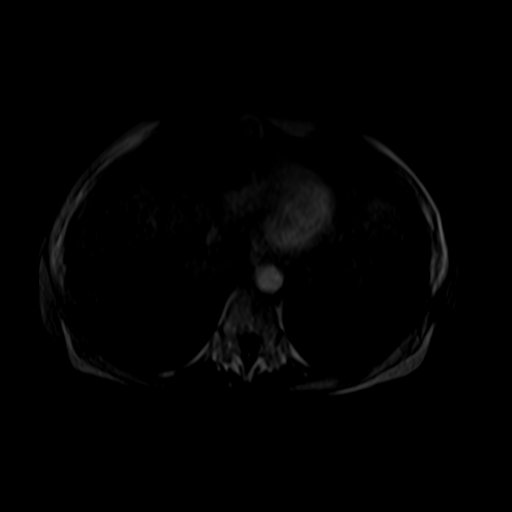

[Series 8: post 45 sec · axial · 2.2mm · 0.72mm/px · z∈[-120,+72]mm · 4 of 88 slices shown]
[im 1/88]
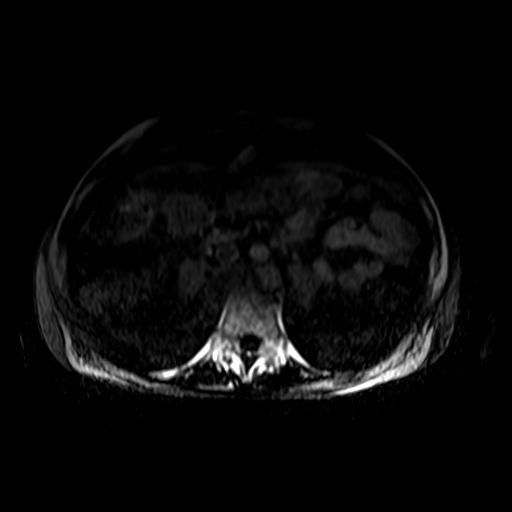
[im 30/88]
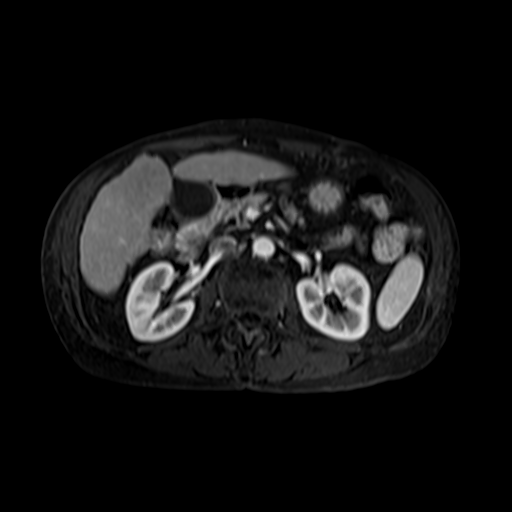
[im 59/88]
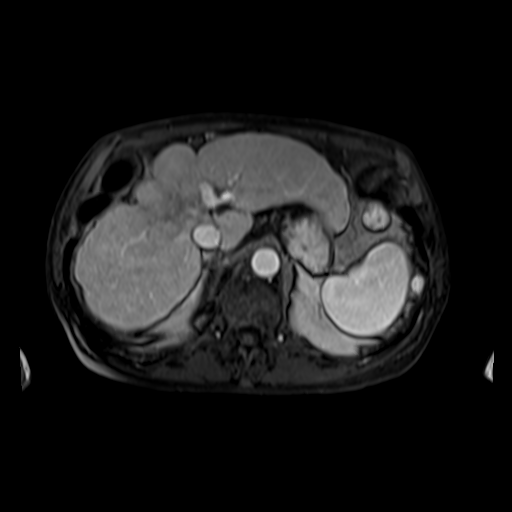
[im 88/88]
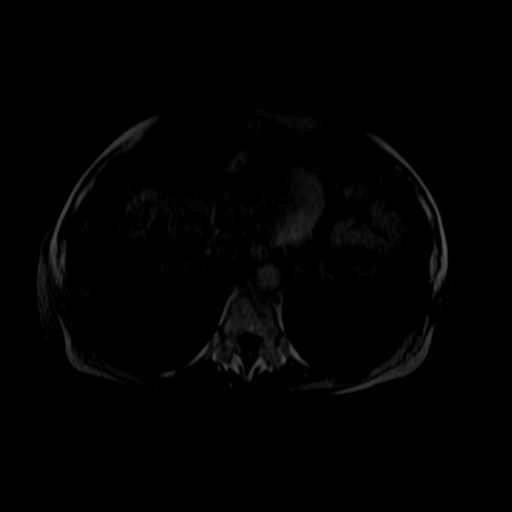

[Series 9: post 90 sec · axial · 2.2mm · 0.72mm/px · z∈[-120,+72]mm · 4 of 88 slices shown]
[im 1/88]
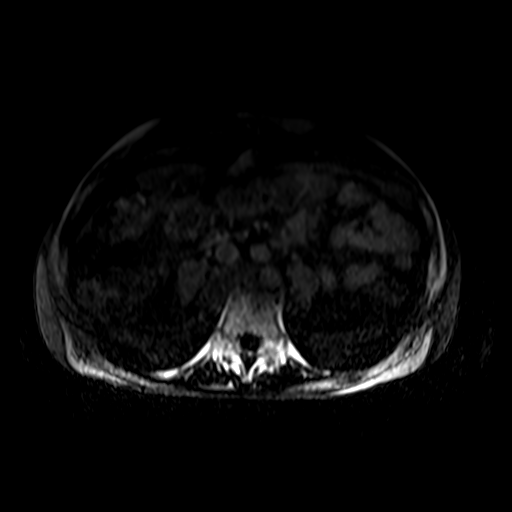
[im 30/88]
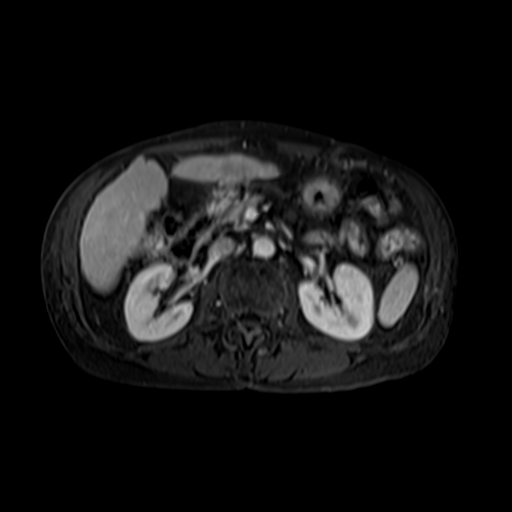
[im 59/88]
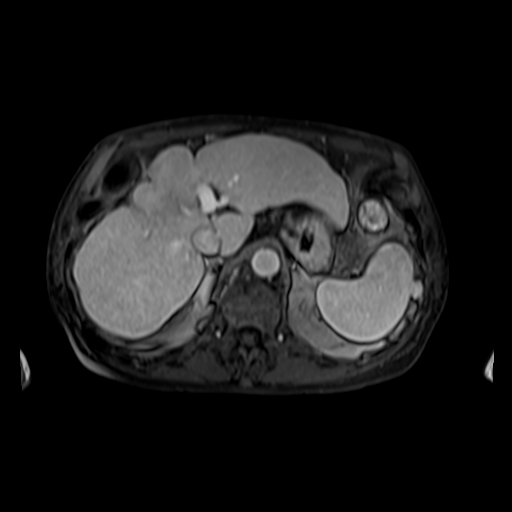
[im 88/88]
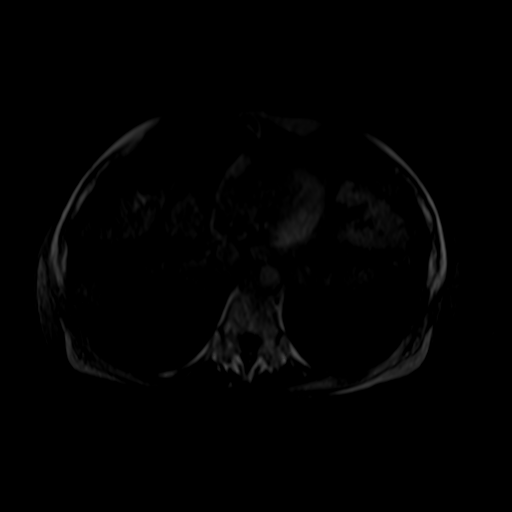

[Series 10: post cor · coronal · 2.2mm · 0.72mm/px · 3 of 72 slices shown]
[im 1/72]
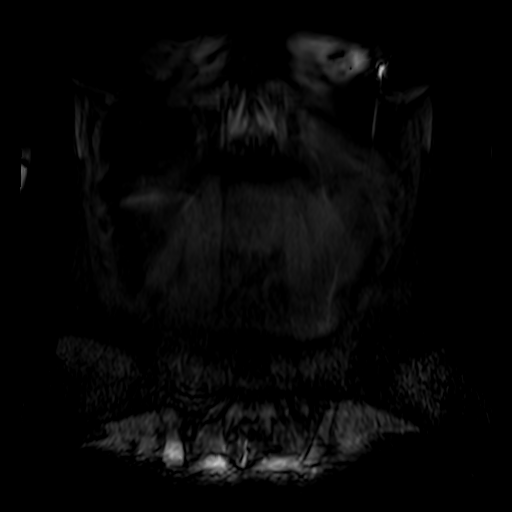
[im 36/72]
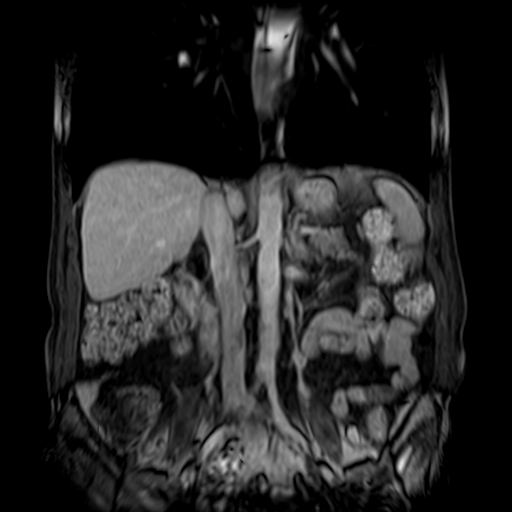
[im 72/72]
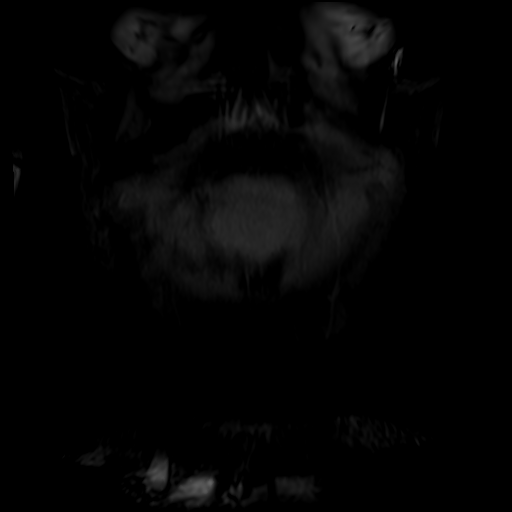

[19 of 48 positions shown; findings below may reference images not displayed]

FINDINGS: Lower chest: No worrisome pulmonary lesions are identified. No
pleural or pericardial effusion.

Hepatobiliary: Advanced cirrhotic changes involving the liver as
demonstrated on the prior study. Unfortunately, the early arterial
phase images are very limited by motion artifact. No obvious
enhancing nodules identified. The 45 second delayed images of are
much better technically and there is a 10.5 x 10.0 mm enhancing
nodule and segment 5 on series 8, image 48. This is unchanged since
the prior MRI pain is most likely a dysplastic nodule. No other
hepatic lesions are identified. The portal and hepatic veins are
patent. No intrahepatic biliary dilatation. The gallbladder is
unremarkable. No common bile duct dilatation.

Pancreas:  No mass, inflammation or ductal dilatation.

Spleen:  Normal size.  No focal lesions.

Adrenals/Urinary Tract:  The adrenal glands and kidneys are normal.

Stomach/Bowel: The stomach, duodenum, visualized small bowel and
visualize colon are grossly normal.

Vascular/Lymphatic: The aorta and branch vessels are normal. The
major venous structures are patent. Stable changes of portal venous
hypertension with portal venous collaterals.

Stable small periportal, celiac axis and hepatic duodenum ligament
nodes which are typical with cirrhosis.

Other:  No ascites or abdominal wall hernia.

Musculoskeletal: No significant bony findings.
IMPRESSION: 1. Stable advanced cirrhotic changes involving the liver with portal
venous hypertension and portal venous collaterals but no
splenomegaly or ascites.
2. 10.5 x 10 mm enhancing nodule in segment 5 of the liver. This has
increased slightly in size since the MRI from September 2014. I would
favor this being a small HCC but it also could be a dysplastic
nodule. Recommend continued surveillance.
3. No acute abdominal findings. Stable scattered upper abdominal
lymph nodes.

## 2016-10-14 ENCOUNTER — Ambulatory Visit
Admission: RE | Admit: 2016-10-14 | Discharge: 2016-10-14 | Disposition: A | Discharge status: Home / Self Care | Payer: Medicaid Other | Source: Ambulatory Visit | Attending: Family Medicine | Admitting: Family Medicine

## 2016-10-14 DIAGNOSIS — Z1231 Encounter for screening mammogram for malignant neoplasm of breast: Secondary | ICD-10-CM

## 2017-01-09 ENCOUNTER — Other Ambulatory Visit: Payer: Self-pay | Admitting: Internal Medicine

## 2017-02-03 IMAGING — CT CT GUIDANCE TISSUE ABLATION
1 of 6 series · 10 of 32 positions shown, 16 images · non-contrast
Comparison: MRI of the abdomen dated 01/16/2016

CLINICAL DATA: Cirrhosis and enhancing tumor of the liver
consistent with hepatocellular carcinoma. The patient presents for
percutaneous thermal ablation of the neoplasm.

EXAM:
CT-GUIDED PERCUTANEOUS THERMAL ABLATION OF LIVER

[Series 2: i-spiral 5.0 b31f · axial · 0.71mm/px · z∈[-390,-184]mm · 10 of 73 slices shown, 16 images]
[im 7/73  soft-tissue]
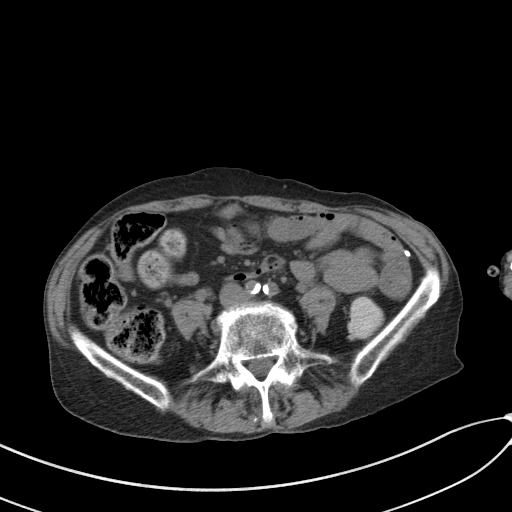
[im 7/73  bone]
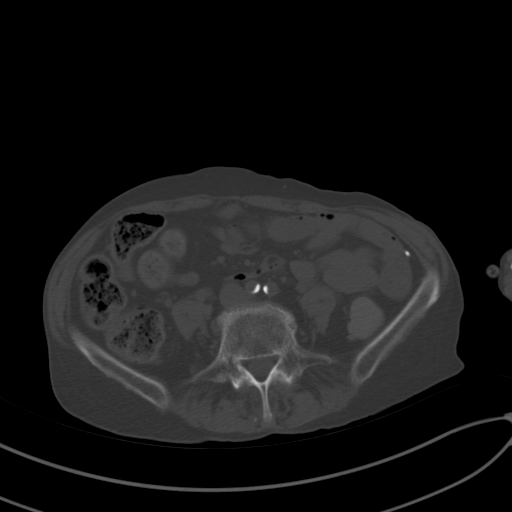
[im 14/73  soft-tissue]
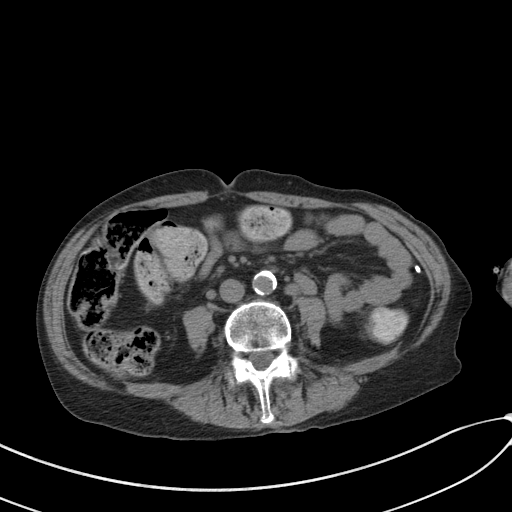
[im 20/73  soft-tissue]
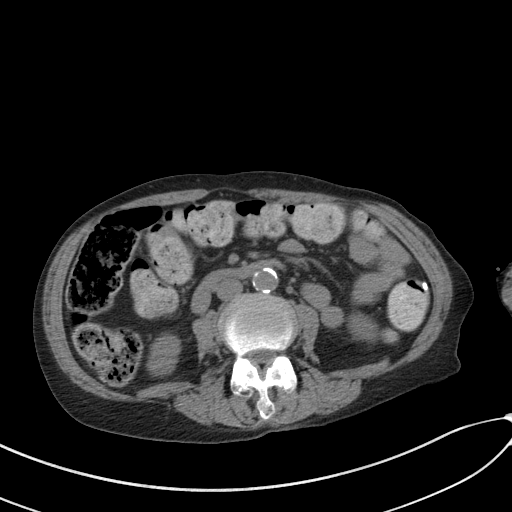
[im 27/73  soft-tissue]
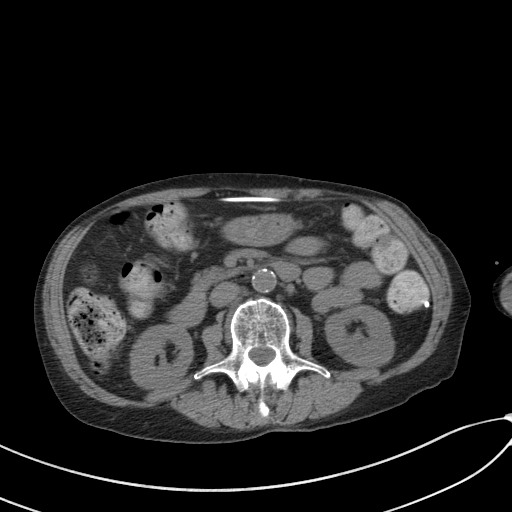
[im 33/73  soft-tissue]
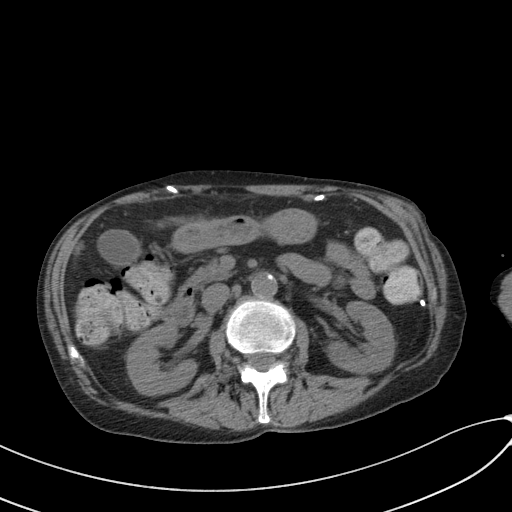
[im 40/73  soft-tissue]
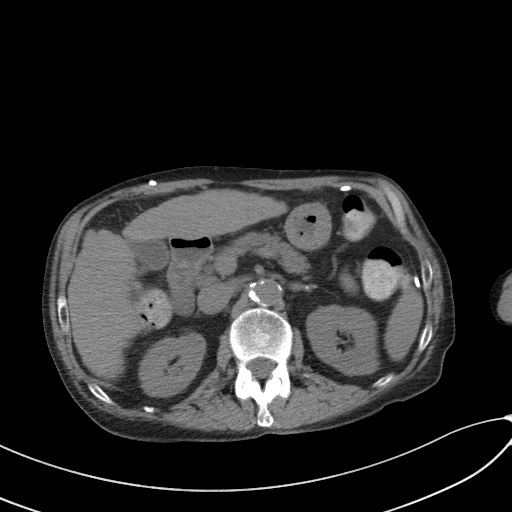
[im 46/73  soft-tissue]
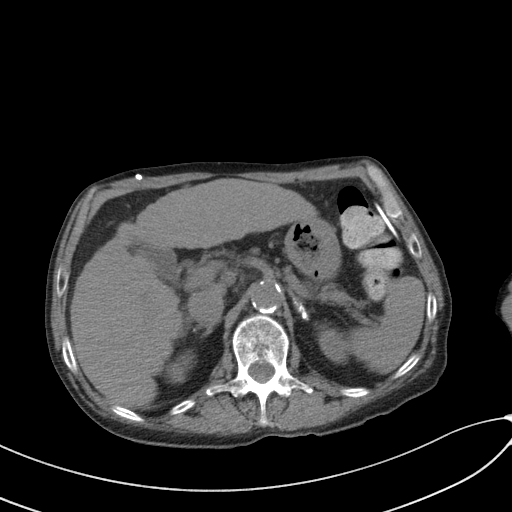
[im 46/73  lung]
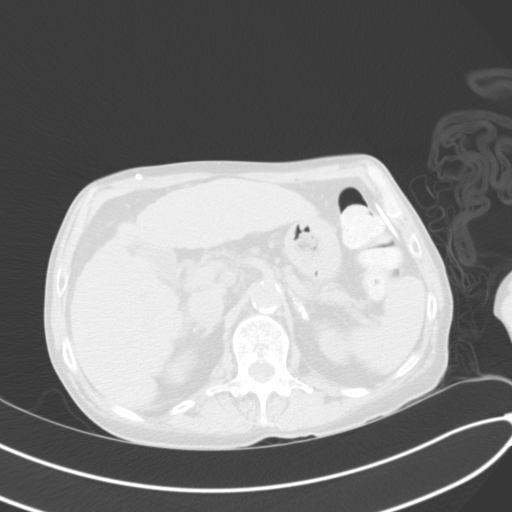
[im 53/73  soft-tissue]
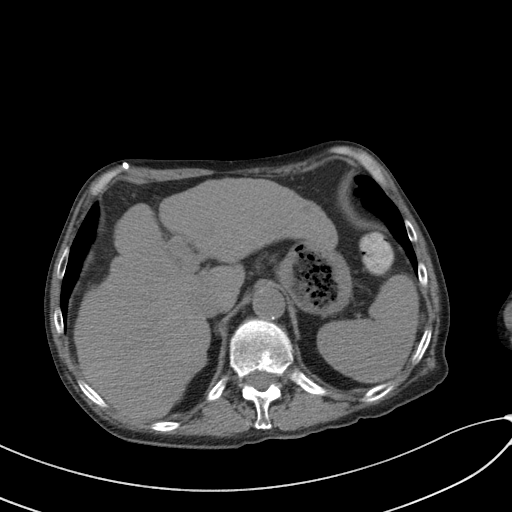
[im 53/73  lung]
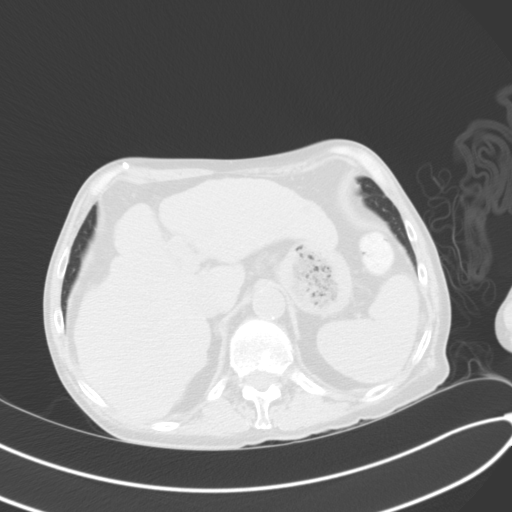
[im 59/73  soft-tissue]
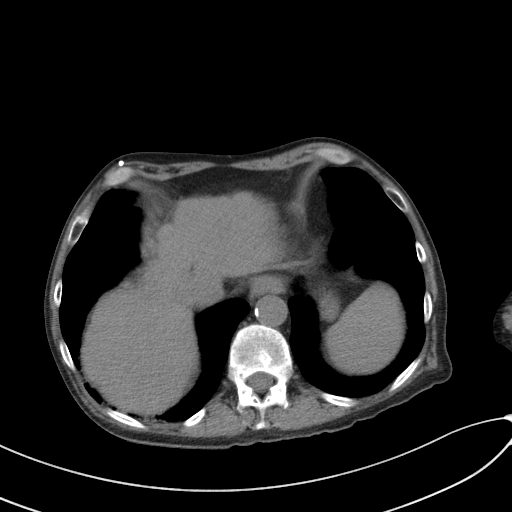
[im 59/73  lung]
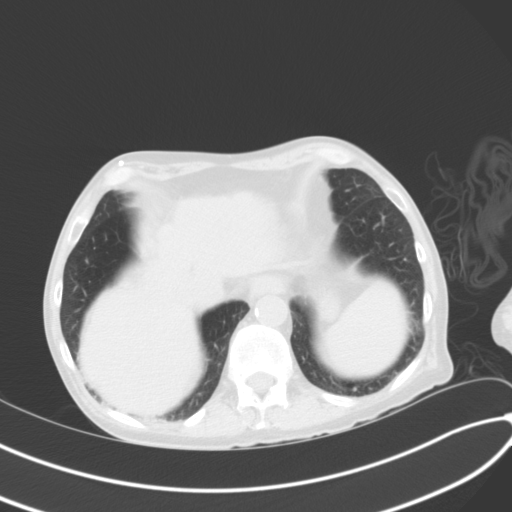
[im 59/73  bone]
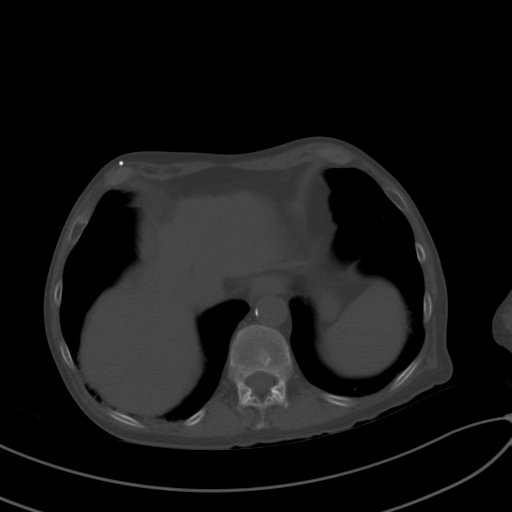
[im 66/73  soft-tissue]
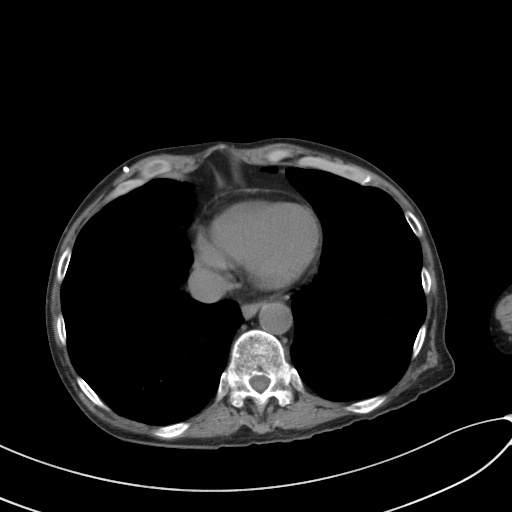
[im 66/73  lung]
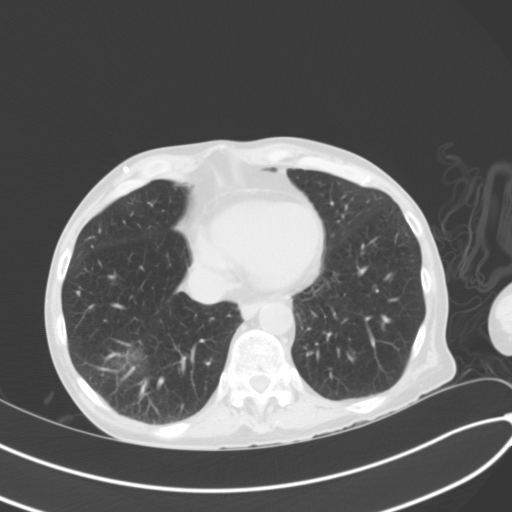

[10 of 32 positions shown; findings below may reference images not displayed]

ANESTHESIA/SEDATION:
Anesthesia:  General

Medications: 2 g IV Ancef. The antibiotic was administered in an
appropriate time interval prior to needle puncture of the skin.

CONTRAST:  50 mL Isovue 370 IV

PROCEDURE:
The procedure, risks, benefits, and alternatives were explained to
the patient. Questions regarding the procedure were encouraged and
answered. The patient understands and consents to the procedure. A
time-out was performed prior to initiating the procedure.

The patient was placed under general anesthesia. Initial unenhanced
CT was performed in a supine position to localize the liver.
Ultrasound was also performed of the liver.

The abdominal wall was prepped with chlorhexidine in a sterile
fashion, and a sterile drape was applied covering the operative
field. A sterile gown and sterile gloves were used for the
procedure.

Prior to administration of IV contrast for CT imaging of the liver,
additional venous access was necessary. This was accomplished
utilizing ultrasound guidance during the procedure. The left upper
arm was prepped with chlorhexidine. Under direct ultrasound
guidance, the left brachial vein was accessed above the antecubital
fossa with a 21 gauge needle. After advancing a guidewire through
the needle, a 4 French micropuncture dilator was advanced over the
wire. The dilator was connected to IV tubing, aspirated and flushed
with saline. This was then utilized for power injection of contrast
material during the procedure.

Arterial phase CT of the liver was then performed with
administration of IV contrast. After localizing enhancing neoplasm
in the subcapsular anterior portion of segment 5, a series of 2
separate NeuWave PR probes were then advanced, bracketing the
lesion. After confirming probe positioning, thermal ablation was
performed over 5 minutes at [address] Post-procedural CT was
performed.The probes were then removed as the tracts were
cauterized.

COMPLICATIONS:
Small right basilar pneumothorax. SIR level A: No therapy, no
consequence.
FINDINGS: The subcapsular tumor in the liver was a cold by ultrasound imaging.
Ultrasound shows a very heterogeneous and nodular appearance to the
liver parenchyma in this region and it was not possible to
discretely define the mass.

After administration of IV contrast, arterial phase imaging shows
very avid enhancement of the subcapsular mass in the anterior right
lobe measuring approximately 1.2 x 1.6 cm. This tumor does show some
interval growth since prior MRI imaging in [REDACTED]. Tumor
enhancement pattern and growth is consistent with hepatocellular
carcinoma.

Based on subcapsular location and to decrease risk of
tract/extracapsular seeding of malignancy, ablation was performed by
placing ablation probes on either side of the lesion immediately
adjacent to the lesion margins. Post ablation CT shows expected gas
bubbles within ablated tissue as well as adjacent to the liver.
Postprocedure CT also demonstrated a tiny anterior basilar
pneumothorax which will be followed during recovery by chest x-ray.
IMPRESSION: CT guided percutaneous thermal ablation of subcapsular
hepatocellular carcinoma of the right lobe of the liver. The
procedure was complicated by a tiny right basilar pneumothorax which
will be followed by chest x-ray during recovery. The patient will be
observed overnight. Initial follow-up will be performed in
approximately 4 weeks.

## 2017-02-03 IMAGING — DX DG CHEST 1V
2 series · 2 of 2 positions shown · non-contrast
Comparison: CT abdomen and pelvis including lung bases May 10, 2016 ; chest CT November 03, 2015

CLINICAL DATA: Lung carcinoma right upper lobe. Reported small
pneumothorax seen on right after liver ablation procedure

EXAM:
CHEST 1 VIEW

[chest ap (1 of 2)]
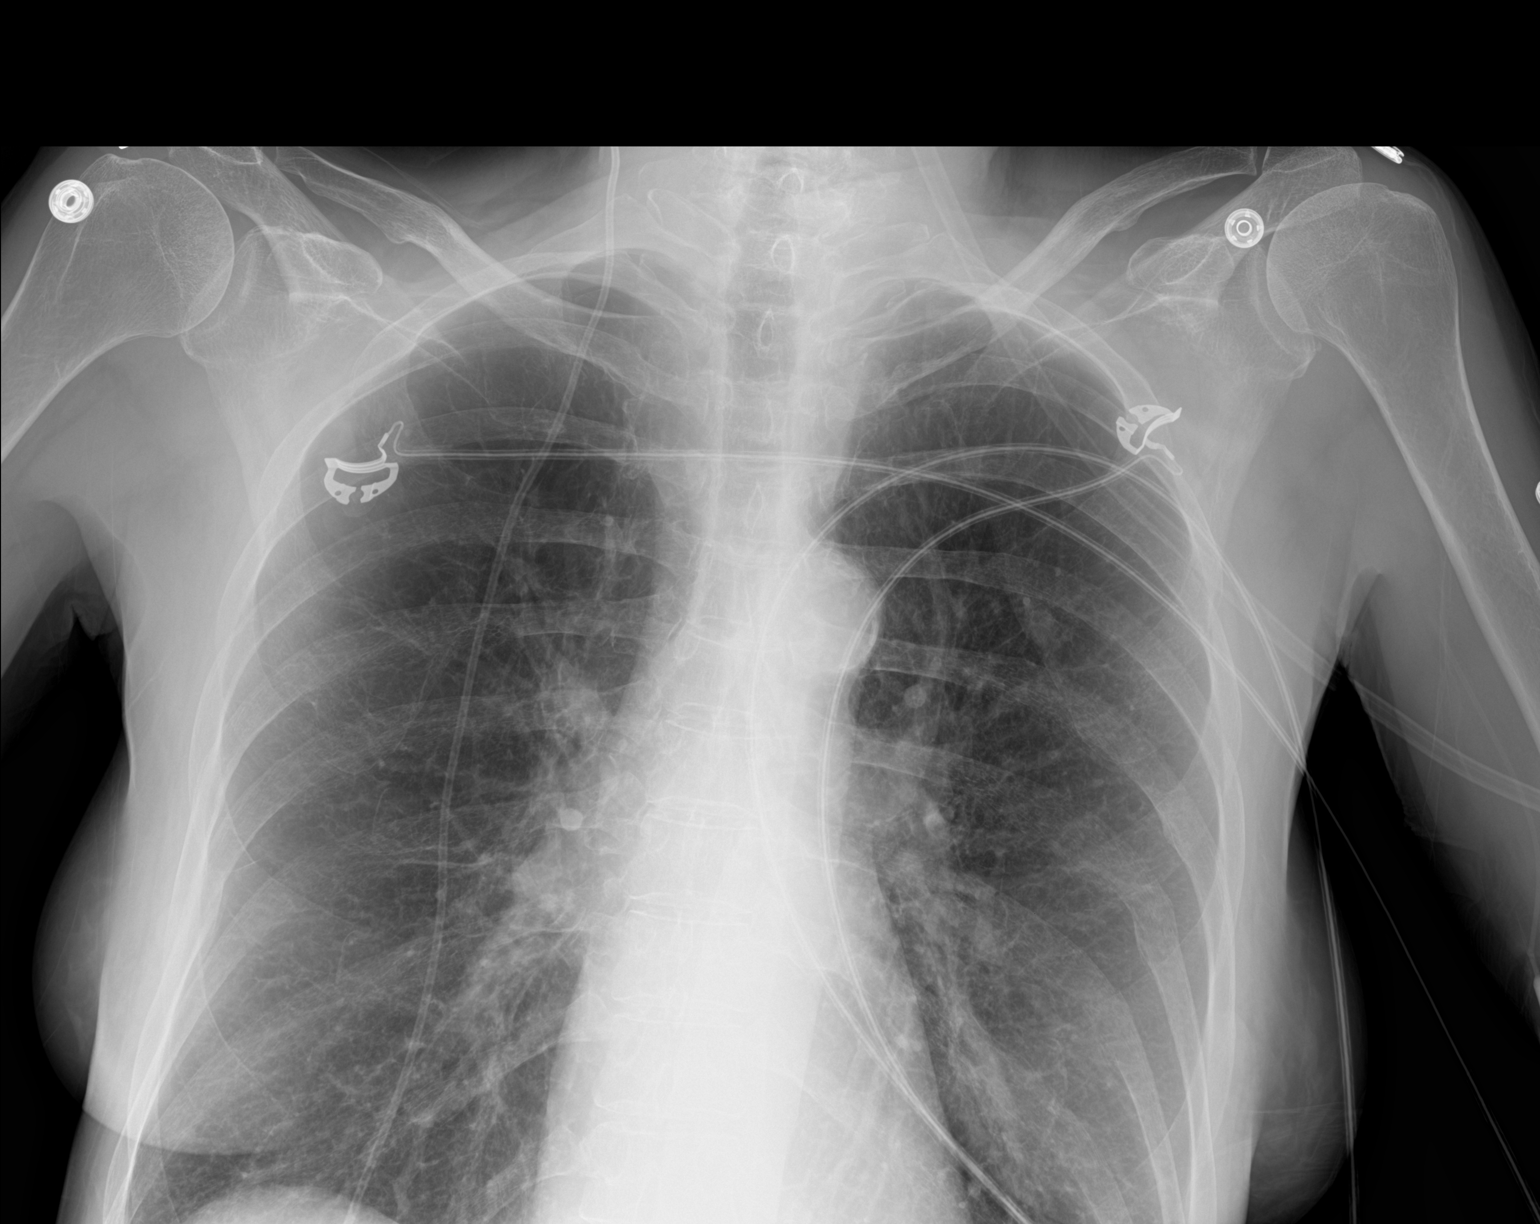

[chest ap (2 of 2)]
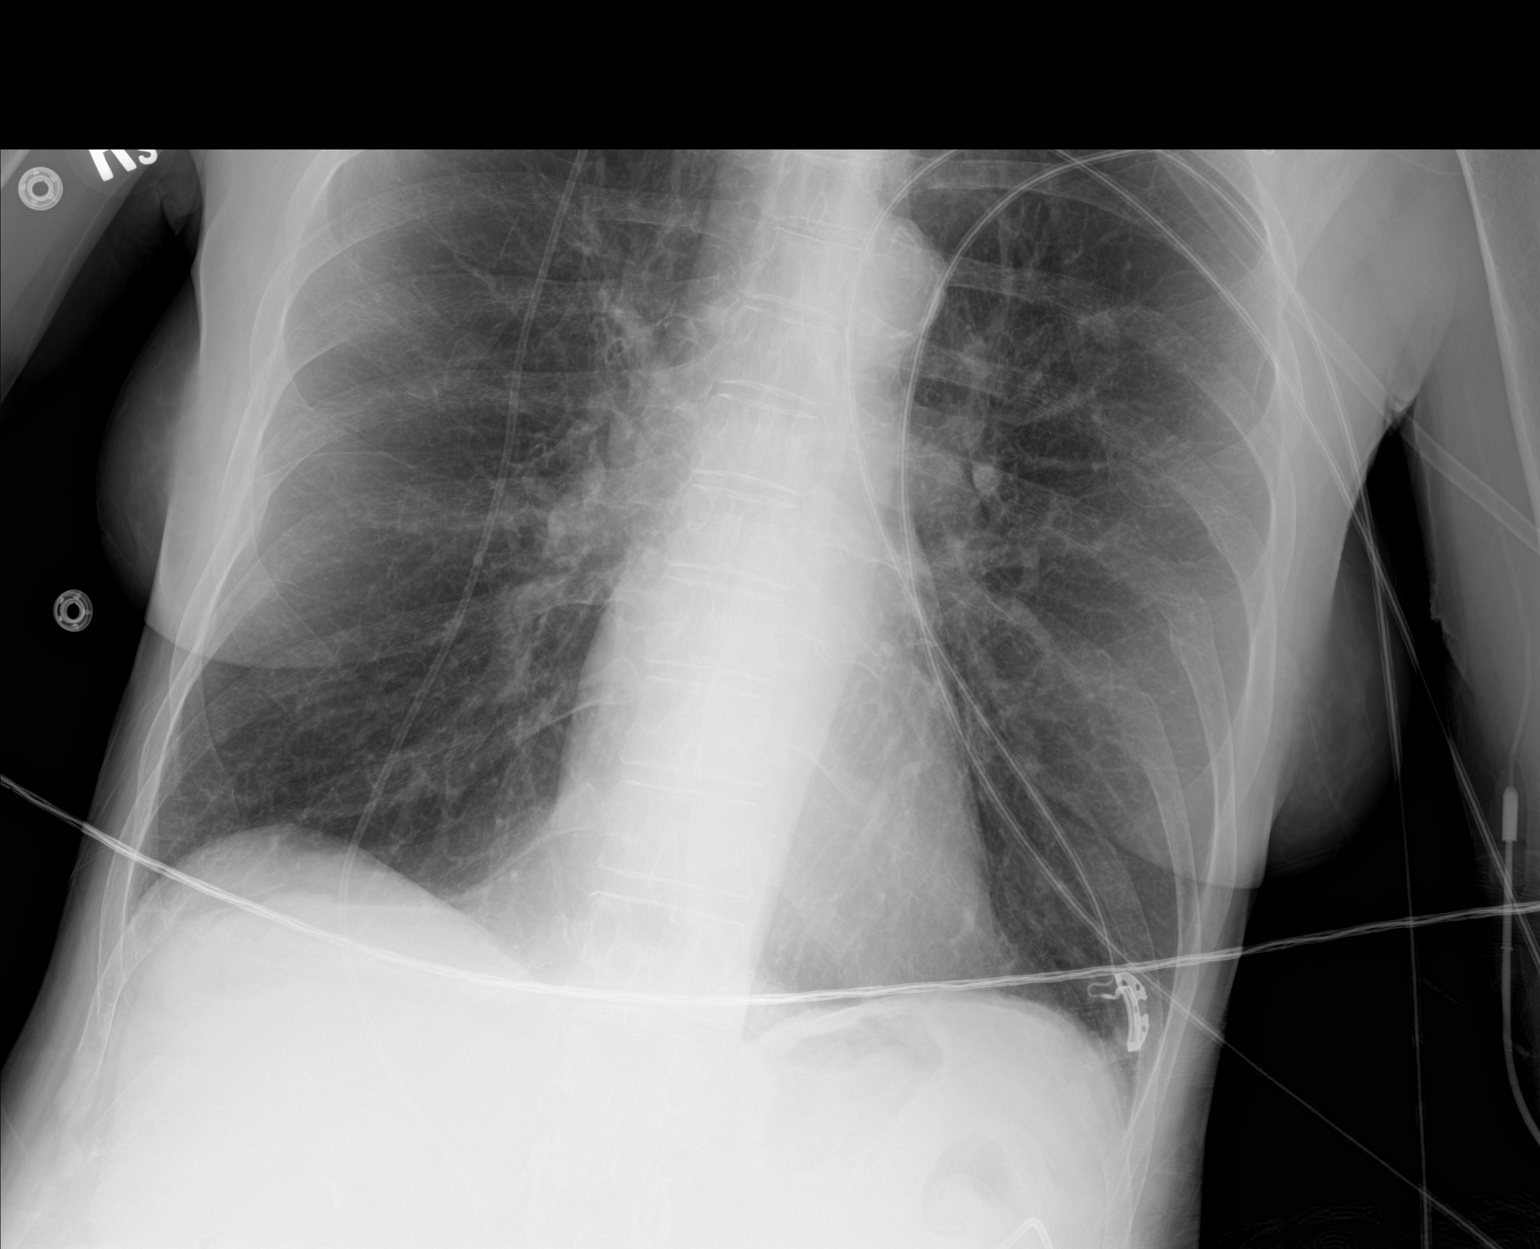

[2 of 2 positions shown; findings below may reference images not displayed]

FINDINGS: There is no demonstrable pneumothorax by radiography. There is no
appreciable edema or consolidation. The previously noted right upper
lobe mass seen on prior CT is not evident on this current
radiographic examination. There is, however, a 1.00.8 cm nodular
opacity in the left upper lobe. Heart size and pulmonary vascularity
are normal. No evident adenopathy. A shunt catheter extends along
the right hemithorax. No bone lesions. There is aortic
atherosclerosis.
IMPRESSION: No pneumothorax demonstrable. No edema or consolidation. Nodular
opacity left upper lobe measuring 1.0 x 0.8 cm, not appreciable
previously. Aortic atherosclerosis. Shunt catheter on right.

## 2017-02-03 IMAGING — CR DG CHEST 1V
1 series · 1 of 1 positions shown · non-contrast
Comparison: CT 11/03/2015

CLINICAL DATA: Preoperative exam for tissue ablation.

EXAM:
CHEST 1 VIEW

[w chest pa]
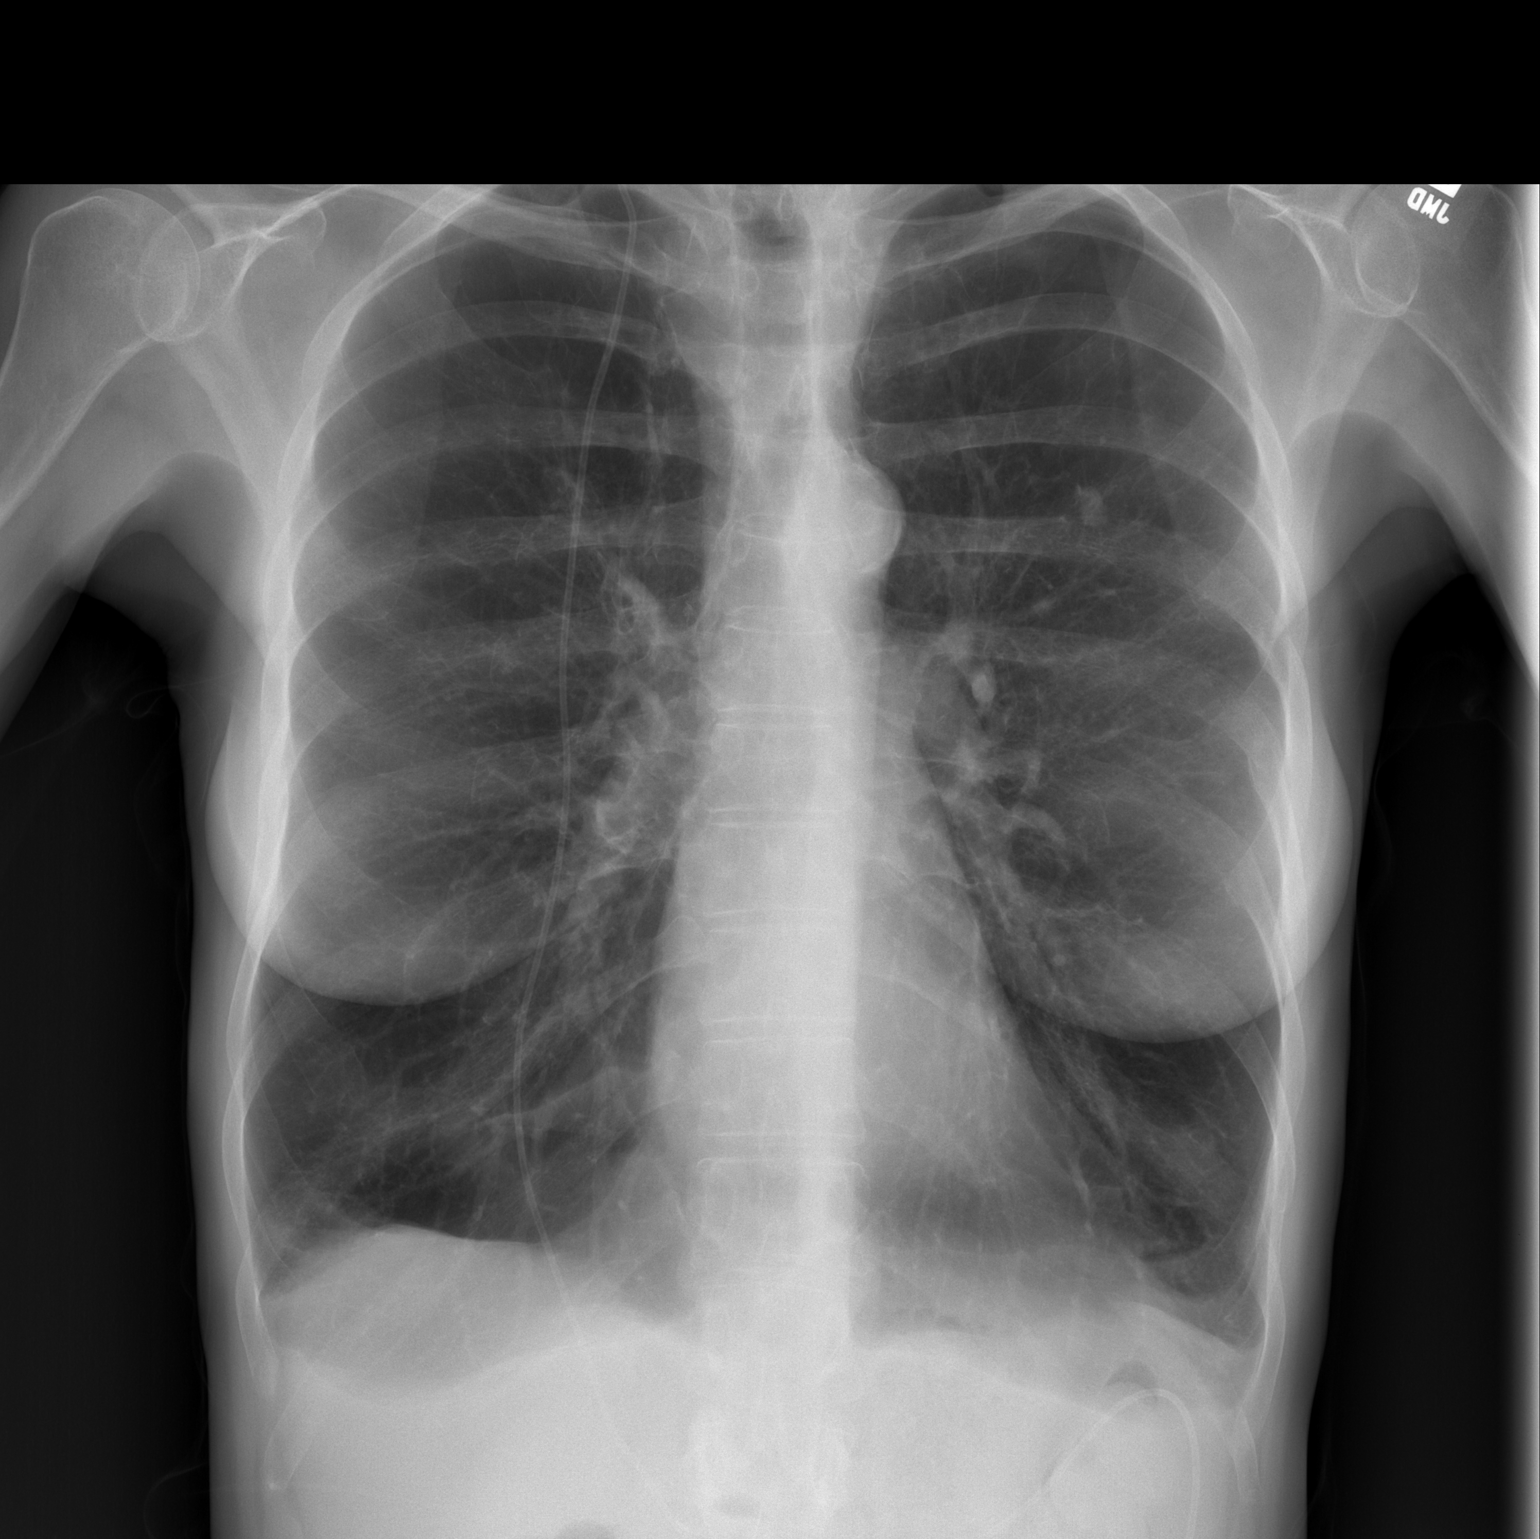

[1 of 1 positions shown; findings below may reference images not displayed]

FINDINGS: Heart size is normal. There is aortic atherosclerosis. Mediastinal
shadows are otherwise normal. VP shunt tube passes over the right
chest. Lungs markedly hyperinflated consistent with diffuse
emphysema. Bilateral upper lobe pulmonary nodules do not appear to
have enlarged. No sign of acute infiltrate or effusion. No acute
bone finding.
IMPRESSION: Emphysema. Bilateral upper lobe pulmonary nodules, probably
unchanged when compared to the CT of October 2015. Follow-up CT was
recommended on the basis of the previous scan and would be due
presently.

## 2017-02-04 IMAGING — DX DG CHEST 1V PORT
2 series · 2 of 2 positions shown · non-contrast
Comparison: Yesterday.

CLINICAL DATA: Followup small right basilar pneumothorax following
a liver ablation procedure.

EXAM:
PORTABLE CHEST 1 VIEW

[chest ap (1 of 2)]
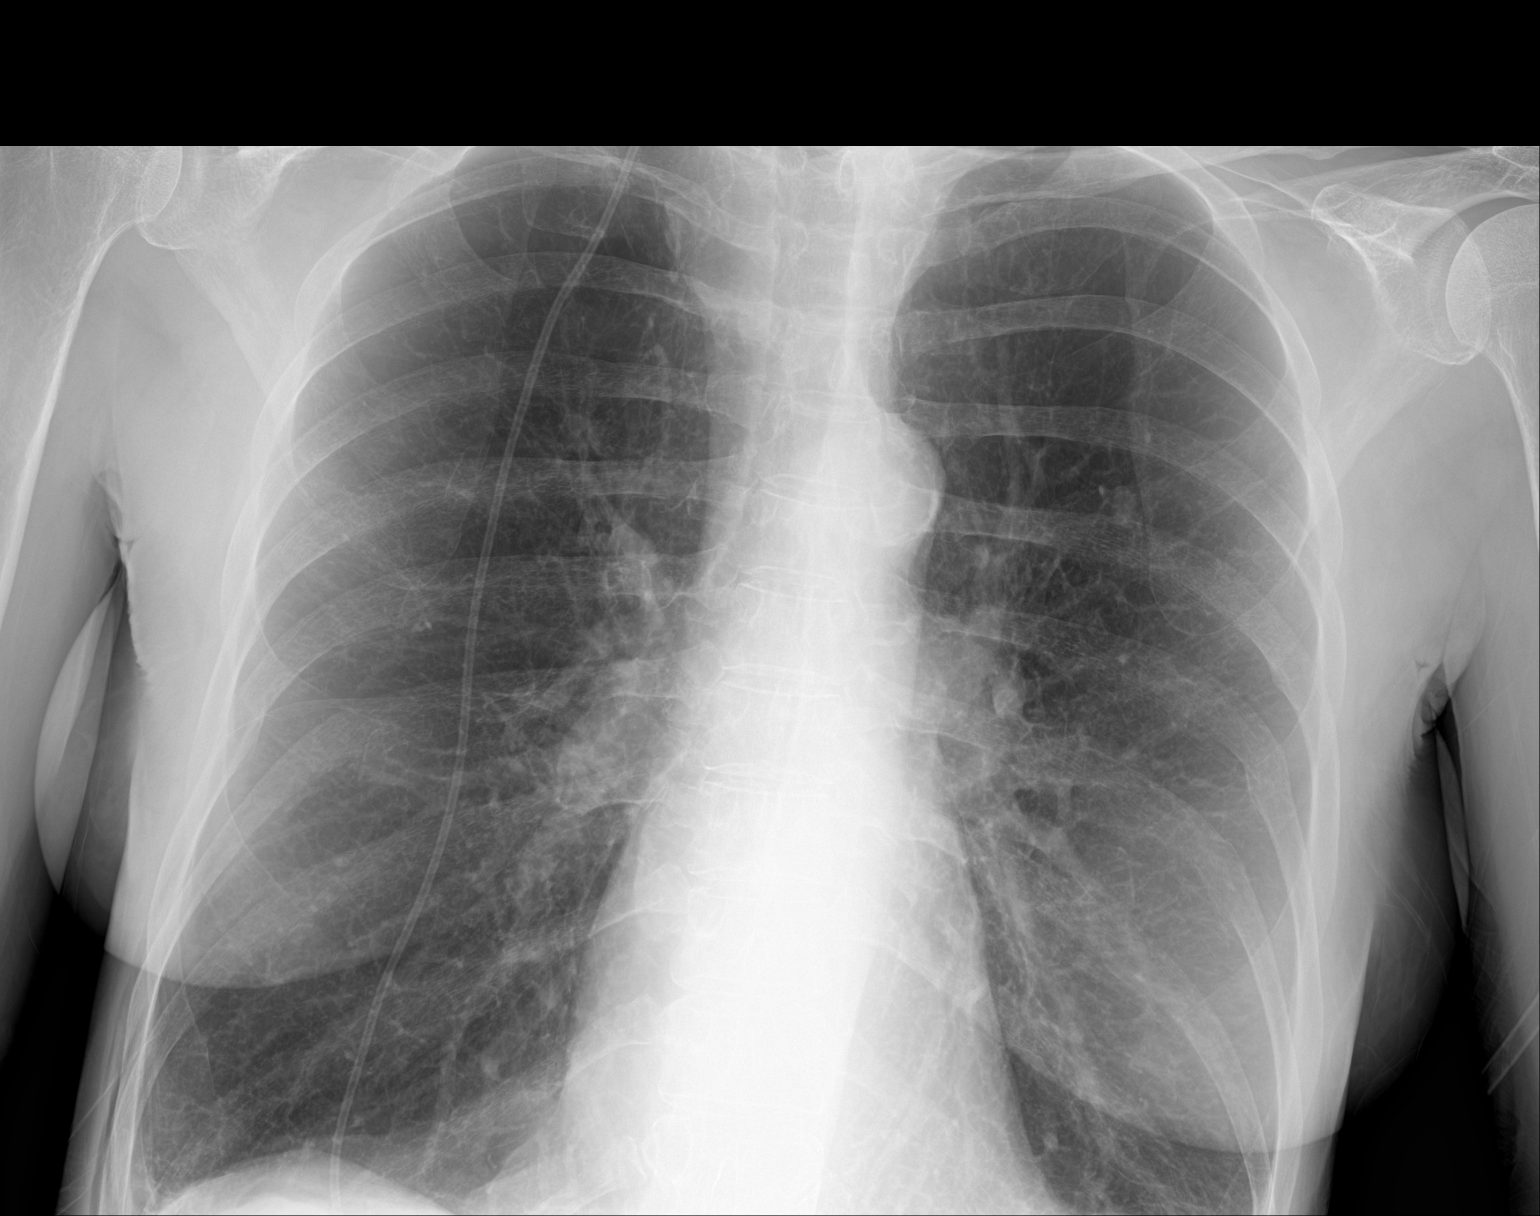

[chest ap (2 of 2)]
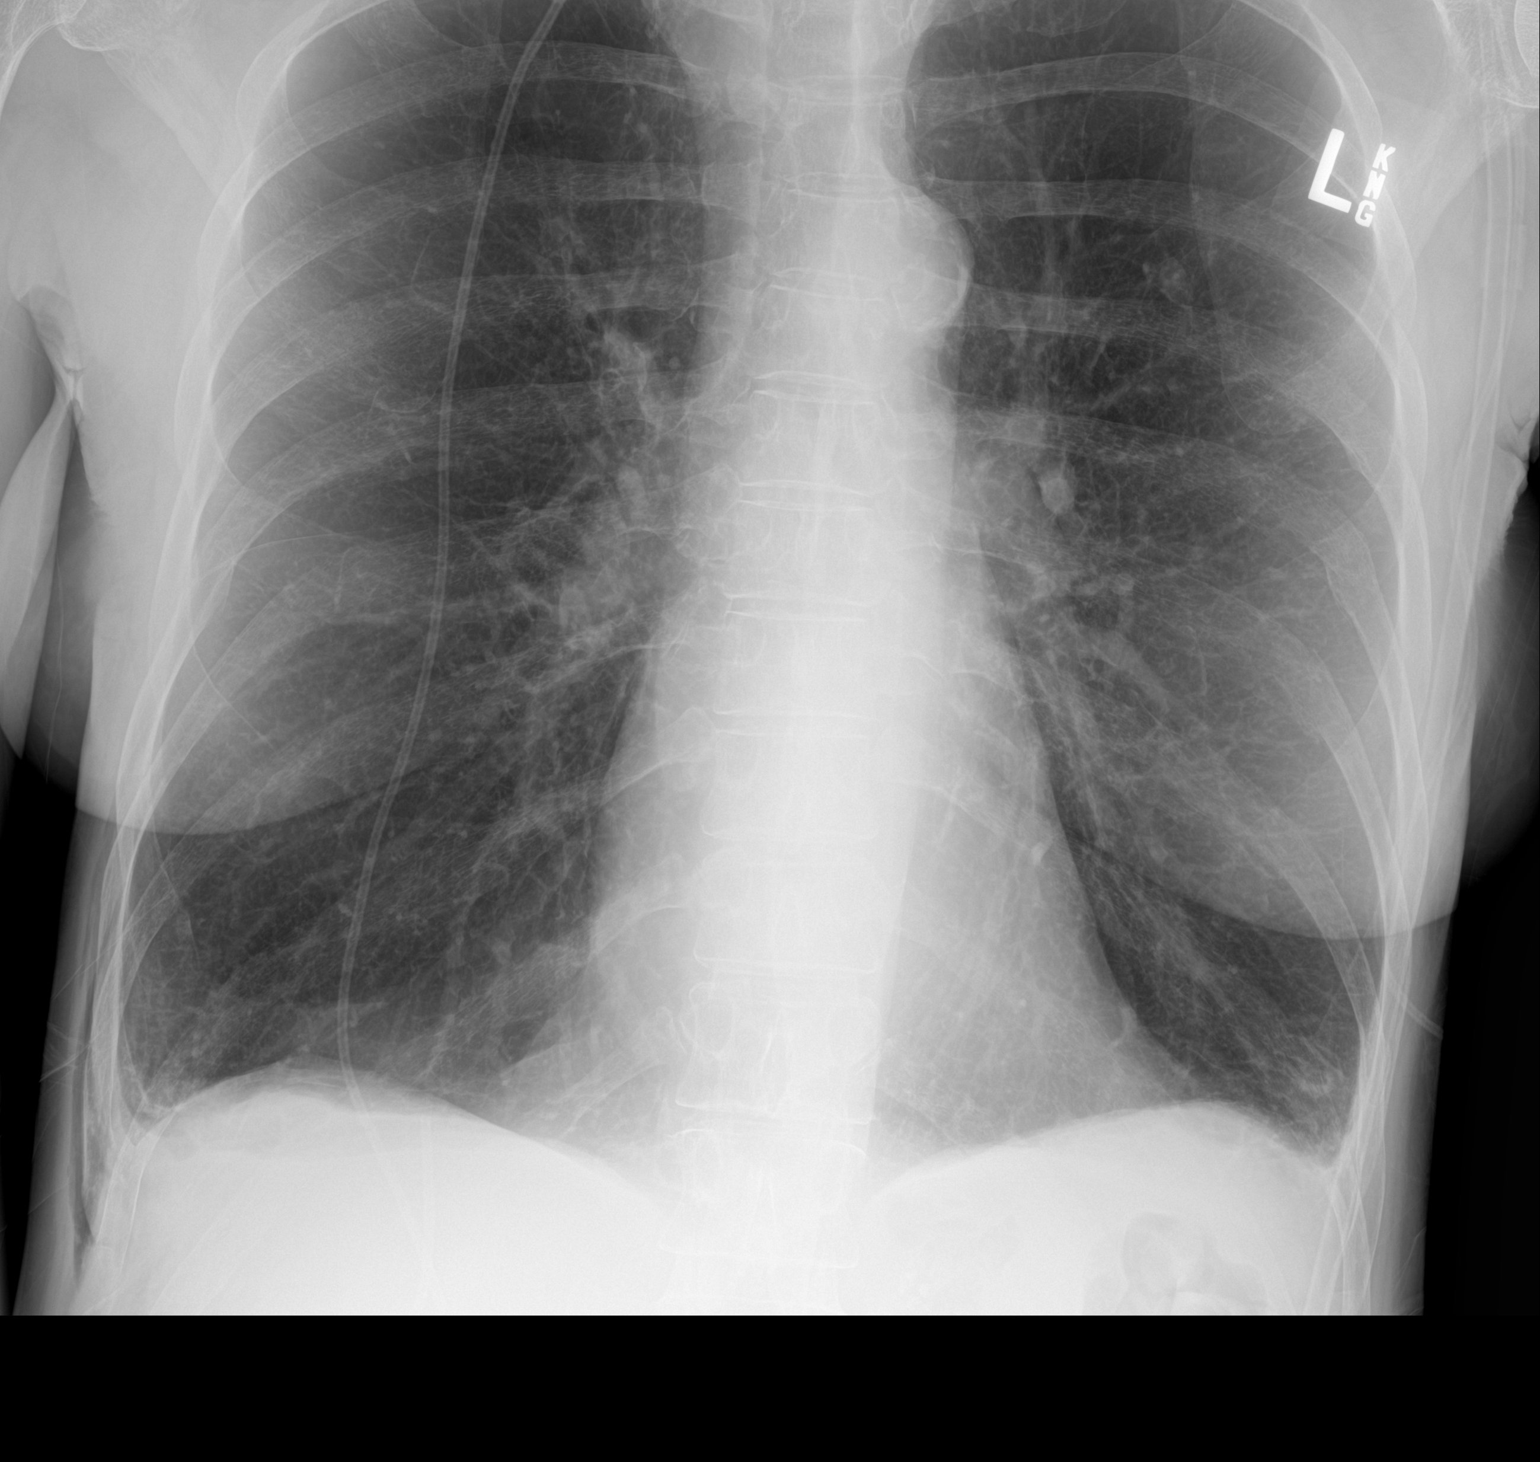

[2 of 2 positions shown; findings below may reference images not displayed]

FINDINGS: A less than 5% right basilar pneumothorax is demonstrated with a
small right pleural effusion and minimal left pleural effusion.
There is also small amount of subcutaneous air laterally at the
right lung base. Minimal bibasilar atelectasis. Normal sized heart.
The lungs are clear and hyperexpanded. Aortic arch calcifications.
Right ventriculoperitoneal shunt tubing. Diffuse osteopenia.
IMPRESSION: 1. Less than 5% right basilar pneumothorax with interval small
amount of subcutaneous air.
2. Mild changes of COPD.
3. Aortic atherosclerosis.

## 2017-02-05 ENCOUNTER — Other Ambulatory Visit: Payer: Self-pay | Admitting: Internal Medicine

## 2017-02-12 ENCOUNTER — Other Ambulatory Visit (INDEPENDENT_AMBULATORY_CARE_PROVIDER_SITE_OTHER): Payer: Medicaid Other

## 2017-02-12 ENCOUNTER — Encounter: Payer: Self-pay | Admitting: Internal Medicine

## 2017-02-12 ENCOUNTER — Ambulatory Visit (INDEPENDENT_AMBULATORY_CARE_PROVIDER_SITE_OTHER)
Admission: RE | Admit: 2017-02-12 | Discharge: 2017-02-12 | Disposition: A | Discharge status: Home / Self Care | Payer: Medicaid Other | Source: Ambulatory Visit | Attending: Internal Medicine | Admitting: Internal Medicine

## 2017-02-12 ENCOUNTER — Ambulatory Visit (INDEPENDENT_AMBULATORY_CARE_PROVIDER_SITE_OTHER): Payer: Medicaid Other | Admitting: Internal Medicine

## 2017-02-12 VITALS — BP 118/68 | HR 80 | Ht 68.0 in | Wt 97.4 lb

## 2017-02-12 DIAGNOSIS — R0609 Other forms of dyspnea: Secondary | ICD-10-CM | POA: Diagnosis not present

## 2017-02-12 DIAGNOSIS — J449 Chronic obstructive pulmonary disease, unspecified: Secondary | ICD-10-CM

## 2017-02-12 DIAGNOSIS — J984 Other disorders of lung: Secondary | ICD-10-CM

## 2017-02-12 DIAGNOSIS — F17210 Nicotine dependence, cigarettes, uncomplicated: Secondary | ICD-10-CM

## 2017-02-12 DIAGNOSIS — R0600 Dyspnea, unspecified: Secondary | ICD-10-CM

## 2017-02-12 LAB — CBC WITH DIFFERENTIAL/PLATELET
Basophils Absolute: 0 10*3/uL (ref 0.0–0.1)
Basophils Relative: 0.5 % (ref 0.0–3.0)
Eosinophils Absolute: 0.1 10*3/uL (ref 0.0–0.7)
Eosinophils Relative: 1.2 % (ref 0.0–5.0)
HCT: 50 % — ABNORMAL HIGH (ref 36.0–46.0)
Hemoglobin: 16.5 g/dL — ABNORMAL HIGH (ref 12.0–15.0)
Lymphocytes Relative: 35.3 % (ref 12.0–46.0)
Lymphs Abs: 2.8 10*3/uL (ref 0.7–4.0)
MCHC: 33 g/dL (ref 30.0–36.0)
MCV: 97.4 fl (ref 78.0–100.0)
Monocytes Absolute: 0.4 10*3/uL (ref 0.1–1.0)
Monocytes Relative: 5.3 % (ref 3.0–12.0)
Neutro Abs: 4.6 10*3/uL (ref 1.4–7.7)
Neutrophils Relative %: 57.7 % (ref 43.0–77.0)
Platelets: 145 10*3/uL — ABNORMAL LOW (ref 150.0–400.0)
RBC: 5.13 Mil/uL — ABNORMAL HIGH (ref 3.87–5.11)
RDW: 14 % (ref 11.5–15.5)
WBC: 7.9 10*3/uL (ref 4.0–10.5)

## 2017-02-12 LAB — TSH: TSH: 0.6 u[IU]/mL (ref 0.35–4.50)

## 2017-02-12 NOTE — Progress Notes (Signed)
Subjective:  Patient ID: Wanda Prince, female   DOB: [birth year]    MRN: [id number]    Brief patient profile:  81 yowf active smoker  with deteriorating sob x weeks > admit   Date of Admission: 04/22/2015   Date of Discharge: 05/07/2015 Attending Physician: Aldine Contes, MD  Discharge Diagnosis: Active Problems:  COPD with acute exacerbation ([place])  COPD exacerbation ([place])  Acute respiratory failure with hypoxemia ([place])  Encounter for imaging study to confirm orogastric (OG) tube placement  Encounter for nasogastric (NG) tube placement  Acute respiratory failure ([place])  Lung mass  Liver mass   05/18/2015 1st Wanda Prince office visit/ Wanda Prince  maint rx spiriva/advair but very poor technique  Chief Complaint  Patient presents with  . Prince Consult    Referred by Dr. Suzanna Obey. Pt states that she was dxed with COPD years ago. She c/o increased SOB for the past month. She is SOB just sitting and doing nothing and unable to exert herself much due to SOB.   baseline = used [employer] parking / sometimes used scooter sometimes not  / now sob at rest  Has flutter not using/ very congested cough but no purulent mucus  Sleeps on 2 pillows ok Very poor insight into meds/ flutter/ purse lip  rec Pantoprazole (protonix) 40 mg   Take  30-60 min before first meal of the day and Pepcid (famotidine)  20 mg one @  bedtime until return to office - this is the best way to tell whether stomach acid is contributing to your problem.   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 Follow-up    Breathing has improved some. She is using albuterol inhaler 2 x daily and neb 1 x daily on average.    now able  to walk at HT leaning on cart/ no noct symptoms/ still not smoking  rec Plan A =  Autoamatic = Continue Anoro one click each am  Plan B=   Backup Only use your albuterol as a rescue medication  Only use the nebulizer if you try the inhaler first and it doesn't work, ok to use up to every 4 hours if you must    07/24/2015  f/u ov/Wanda Prince re: copd GOLD IV  maint rx anoro  Chief Complaint  Patient presents with  . Follow-up    PFT done at [facility]. Breathing is unchanged. Still using albuterol inhaler 2 x and neb 1 x daily on average.   doe x across the room = MMRC4  = sob if tries to leave home or while getting dressed   rec Pos Quantiferon Gold TB > neg afb smear /culture > set up for [place] for navigational bx November 22 2015   Set up for RT by [facility] 10/25/15     10/24/2015  f/u ov/Wanda Prince re:  GOLD IV copd/ maint rx anoro / still smoking  Chief Complaint  Patient presents with  . Follow-up    Breathing has been worse due to humid, hot weather. She has been using albuterol inhaler 3 x daily on average.   doe actually better than before =  MMRC3 = can't walk 100 yards even at a slow pace at a flat grade s stopping due to sob rec The TB test indicates you have been exposed but there is no evidence of active TB at this point No change in medications  Please remember to go to the  xray department downstairs for your tests - we will call you with the results when they are available.   03/18/2016  f/u ov/Wanda Prince re:  GOLD IV copd/still smoking / needs to be cleared for IR procedure/ maint rx anoro/ saba hfa  And neb  Chief Complaint  Patient presents with  . Follow-up    Pt here for surgical clearance - scheduled May 10, 2016. Pt states that breathing has been "so-so" since last OV.    no change doe, still ok leaning on buggy/ no 02 = MMRC3  rec You are cleared for ablation therapy Plan A = Automatic = Anoro each am  Plan B = Backup Only use your albuterol(Proair)  as a rescue medication  Plan C =  Crisis - only use your albuterol nebulizer if you first try Plan B and it fails to help > ok to use the nebulizer up to every 4 hours but if start needing it regularly call for immediate appointment   02/12/2017  f/u ov/Wanda Prince re:   GOLD IV/ still smoking/ using saba sev times a day  Chief Complaint  Patient presents with  . Follow-up    SOB with activity, weight loss greater than 15 pounds in 6 months   doe still = MMRC3 = can't walk 100 yards even at a slow pace at a flat grade s stopping due to sob  s 02/ HT but not wm leaning  Sleeping ok 2 pillows o/w flat  s am congestion   appetite good / eats well but wt loss/ no fever or sweats   No obvious day to day or daytime variability or assoc excess/ purulent sputum or mucus plugs or hemoptysis or cp or chest tightness, subjective wheeze or overt sinus or hb symptoms. No unusual exp hx or h/o childhood pna/ asthma or knowledge of premature birth.  Sleeping ok flat without nocturnal  or early am exacerbation  of respiratory  c/o's or need for noct saba. Also denies any obvious fluctuation of symptoms with weather or environmental changes or other aggravating or alleviating factors except as outlined above   Current Allergies, Complete Past Medical History, Past Surgical History, Family History, and Social History were reviewed in [employer] record.  ROS  The following are not active complaints unless bolded Hoarseness, sore throat, dysphagia, dental problems, itching, sneezing,  nasal congestion or discharge of excess mucus or purulent secretions, ear ache,   fever, chills, sweats, unintended wt loss or wt gain, classically pleuritic or exertional cp,  orthopnea pnd or leg swelling, presyncope, palpitations, abdominal pain, anorexia, nausea, vomiting, diarrhea  or change in bowel habits or change in bladder habits, change in stools or change in urine, dysuria, hematuria,  rash, arthralgias, visual complaints, headache,  numbness, weakness or ataxia or problems with walking or coordination,  change in mood/affect or memory.        Current Meds  Medication Sig  . acetaminophen (TYLENOL) 500 MG tablet Take 1,000 mg by mouth every 6 (six) hours as needed for pain.  Marland Kitchen albuterol (PROVENTIL HFA;VENTOLIN HFA) 108 (90 BASE) MCG/ACT inhaler Inhale 2 puffs into the lungs every 4 (four) hours as needed for wheezing or shortness  of breath.   Marland Kitchen albuterol (PROVENTIL) (2.5 MG/3ML) 0.083% nebulizer solution Take 3 mLs (2.5 mg total) by nebulization every 4 (four) hours as needed for wheezing or shortness of breath.  Jearl Klinefelter ELLIPTA 62.5-25 MCG/INH AEPB INHALE ONE PUFF BY MOUTH ONCE DAILY  . aspirin 81 MG chewable tablet Chew 81 mg by mouth daily.  Marland Kitchen b complex vitamins tablet Take 1 tablet by mouth 2 (two) times daily.  . Cholecalciferol ([employer] D3) 2000 units CAPS Take 2,000 Units by mouth daily.  . cyclobenzaprine (FLEXERIL) 5 MG tablet Take 5 mg by mouth every 8 (eight) hours as needed for muscle spasms.   . famotidine (PEPCID) 20 MG tablet Take 20 mg by mouth at bedtime.  . folic acid (FOLVITE) 1 MG tablet Take 1 mg by mouth daily.  Marland Kitchen gabapentin (NEURONTIN) 300 MG capsule Take 600 mg by mouth at bedtime.   . levETIRAcetam (KEPPRA) 500 MG tablet Take 1 tablet (500 mg total) by mouth 2 (two) times daily.  . metoprolol succinate (TOPROL XL) 25 MG 24 hr tablet Take 25 mg by mouth daily.   . Multiple Vitamin (MULITIVITAMIN WITH MINERALS) TABS Take 1 tablet by mouth daily.  . naltrexone (DEPADE) 50 MG tablet Take 50 mg by mouth daily.  . pantoprazole (PROTONIX) 40 MG tablet Take 40 mg by mouth daily.  Marland Kitchen spironolactone (ALDACTONE) 25 MG tablet Take 25 mg by mouth daily.   Marland Kitchen thiamine 100 MG tablet Take 1 tablet (100 mg total) by mouth daily.                        Objective:  Physical Exam:  Thin amb wf walks with cane     02/12/2017      97  03/18/2016      106  07/24/2015        109   06/01/2015         112    05/07/15 105 lb 4.8 oz (47.764 kg)  01/12/14 115 lb 15.4 oz (52.6 kg)  09/29/12 135 lb (61.236 kg)    Vital signs reviewed  - Note on arrival 02 sats  91% on RA   HEENT: nl dentition, turbinates, and oropharynx. Nl external ear canals without cough reflex   NECK :  without JVD/Nodes/TM/ nl carotid upstrokes bilaterally   LUNGS: no acc muscle use,  slt barrel contour chest   Extremely distant bs marked increased Texp s true wheeze   CV:  RRR  no s3 or murmur or increase in P2, no edema   ABD:  soft and nontender with  Mid to late Pos Hoover's  the supine position. No bruits or organomegaly, bowel sounds nl  MS:  Nl gait/ ext warm without deformities, calf tenderness, cyanosis or clubbing No obvious joint restrictions   SKIN: warm and dry without lesions    NEURO:  alert, approp, nl sensorium with  no motor deficits     CXR PA and Lateral:   02/12/2017 :    I personally reviewed images and agree with radiology impression as follows:    Advanced COPD. Negative for pneumonia. Left upper lobe nodule stable     Labs ordered/ reviewed:      Chemistry      Component Value Date/Time   NA 139 05/11/2016 0554   K 4.3 05/11/2016 0554   CL 105 05/11/2016 0554   CO2 29 05/11/2016 0554   BUN 14 05/11/2016 0554   BUN 10.3 11/03/2015  1140   CREATININE 0.65 07/31/2016 0902   CREATININE 0.7 11/03/2015 1140      Component Value Date/Time   CALCIUM 8.3 (L) 05/11/2016 0554   ALKPHOS 94 05/11/2016 0554   AST 156 (H) 05/11/2016 0554   ALT 74 (H) 05/11/2016 0554   BILITOT 0.7 05/11/2016 0554        Lab Results  Component Value Date   WBC 7.9 02/12/2017   HGB 16.5 (H) 02/12/2017   HCT 50.0 (H) 02/12/2017   MCV 97.4 02/12/2017   PLT 145.0 (L) 02/12/2017         Lab Results  Component Value Date   TSH 0.60 02/12/2017          Assessment:

## 2017-02-12 NOTE — Patient Instructions (Addendum)
Work on inhaler technique:  relax and gently blow all the way out then take a nice smooth deep breath back in, triggering the inhaler at same time you start breathing in.  Hold for up to 5 seconds if you can. Blow out thru nose. Rinse and gargle with water when done      Plan A = Automatic = Anoro each am   Plan B = Backup Only use your albuterol(Proair=RED   Yellow= Proventil)  as a rescue medication to be used if you can't catch your breath by resting or doing a relaxed purse lip breathing pattern.  - The less you use it, the better it will work when you need it. - Ok to use the inhaler up to 2 puffs  every 4 hours if you must but call for appointment if use goes up over your usual need - Don't leave home without it !!  (think of it like the spare tire for your car)    Plan C = Crisis - only use your albuterol nebulizer if you first try Plan B and it fails to help > ok to use the nebulizer up to every 4 hours but if start needing it regularly call for immediate appointment    Keep working on cutting down on smoking > The key is to stop smoking completely before smoking completely stops you!    Please remember to go to the lab and x-ray department downstairs in the basement  for your tests - we will call you with the results when they are available.   Please schedule a follow up visit in 3 months but call sooner if needed

## 2017-02-13 ENCOUNTER — Other Ambulatory Visit: Payer: Self-pay | Admitting: Internal Medicine

## 2017-02-13 DIAGNOSIS — R0600 Dyspnea, unspecified: Secondary | ICD-10-CM

## 2017-02-13 DIAGNOSIS — R0609 Other forms of dyspnea: Principal | ICD-10-CM

## 2017-02-13 NOTE — Assessment & Plan Note (Signed)
-   trial of Anoro  05/18/15 > better doe  06/01/2015  - Spirometry 06/01/2015  FEV1 0.54 (19%)  Ratio 39   - PFT's  07/24/2015  FEV1 0.65 (21 % ) ratio 29  p no % improvement from saba p anoro am prior to study with DLCO  12 % corrects to 23 % for alv volume   - 10/24/2015  After extensive coaching HFA effectiveness =    75%    Pt is Group B in terms of symptom/risk and laba/lama therefore appropriate rx at this point.    Each maintenance medication was reviewed in detail including most importantly the difference between maintenance and as needed and under what circumstances the prns are to be used.  Please see AVS for specific  Instructions which are unique to this visit and I personally typed out  which were reviewed in detail in writing with the patient and a copy provided.

## 2017-02-13 NOTE — Progress Notes (Signed)
Already [facility]

## 2017-02-13 NOTE — Assessment & Plan Note (Addendum)
See ct 05/05/15  - PET 06/22/2015 1. 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 2. Mildly hypermetabolic nodule of the LEFT lung base which is decreased slightly in size in short interval favors infectious or inflammatory process. 3. No mediastinal lymphadenopathy metastatic mild hypermetabolic mediastinal adenopathy. - Quant Gold 07/24/2015 > POS > referred to HD > afb neg smear/cultures collected 07/29/15 x 3 faxed to pulmo  09/22/15  >  09/26/2015  referred to Dr Lamonte Sakai ? Navigational bx with afb stain/culture before considering RT?  - CT 11/03/15 > no change in RUL lesion > F/u Dr Lisbeth Renshaw > never RT    Reviewing epic notes she has never had a lung bx nor a a liver bx but is suspected of having [place] and bronchogenic ca and now losing wt and I suspect met dz > consider repeat PET /oncology to review w/u

## 2017-02-13 NOTE — Progress Notes (Signed)
LMTCB

## 2017-02-14 NOTE — Progress Notes (Signed)
Spoke with pt and notified of results per Dr. Wert. Pt verbalized understanding and denied any questions. 

## 2017-02-25 NOTE — Assessment & Plan Note (Signed)
>   3 min  Matter of life or breath discussion/ still unwilling to commit to quit

## 2017-02-25 NOTE — Assessment & Plan Note (Signed)
No evidence for anemia/ thyroid dz > see copd a/p

## 2017-02-28 ENCOUNTER — Telehealth: Payer: Self-pay | Admitting: Internal Medicine

## 2017-02-28 MED ORDER — UMECLIDINIUM-VILANTEROL 62.5-25 MCG/INH IN AEPB: INHALATION_SPRAY | RESPIRATORY_TRACT | 1 refills | Status: DC

## 2017-02-28 NOTE — Telephone Encounter (Signed)
Spoke with patient. She is requesting a refill on her Anoro inhaler to be sent to [facility]. Advised patient I would send in RX refill today.   She verbalized understanding. Nothing else needed at time of call.

## 2017-03-21 IMAGING — CT CT CHEST W/ CM
2 of 4 series · 15 of 36 positions shown, 18 images · IV contrast (iopamidol)
Comparison: 11/03/2015

CLINICAL DATA: History of lung cancer.

EXAM:
CT CHEST WITH CONTRAST
TECHNIQUE: Multidetector CT imaging of the chest was performed during
intravenous contrast administration.
CONTRAST:  75mL [id number] IOPAMIDOL ([id number]) INJECTION 61%

[Series 2: axial st · axial · 0.71mm/px · z∈[-220,+66]mm · 12 of 171 slices shown, 15 images]
[im 14/171  mediastinal]
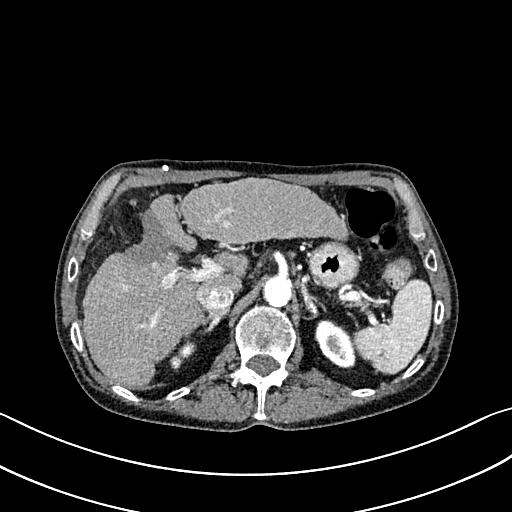
[im 14/171  lung]
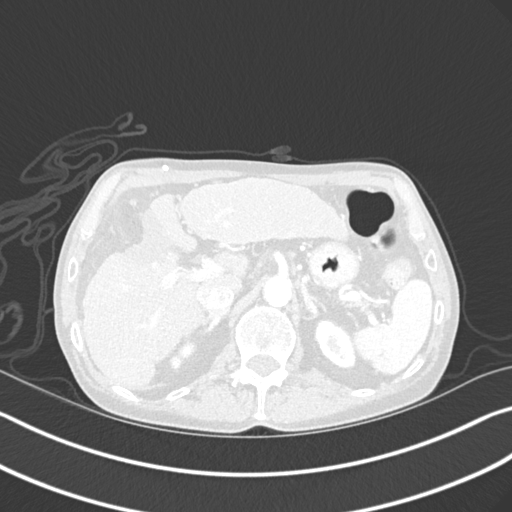
[im 27/171  lung]
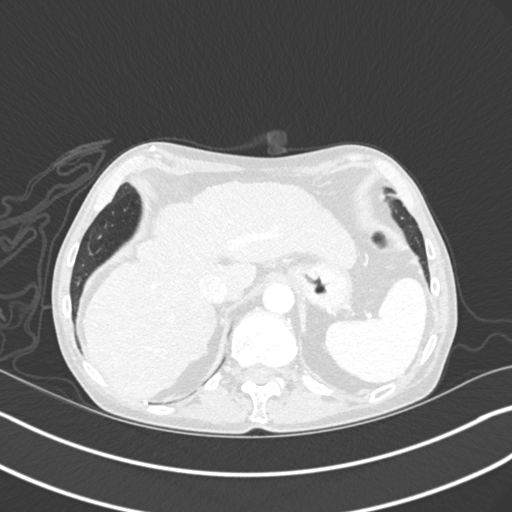
[im 40/171  lung]
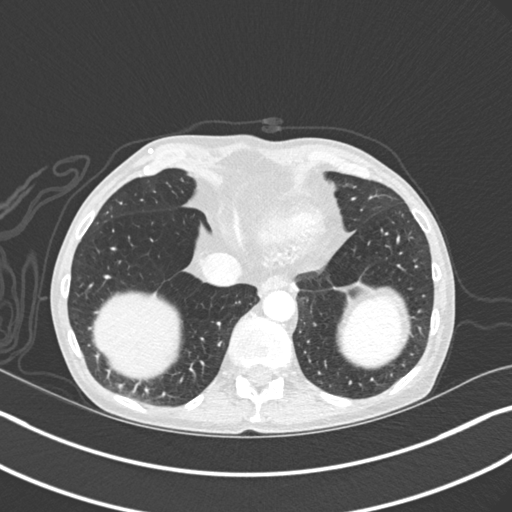
[im 53/171  lung]
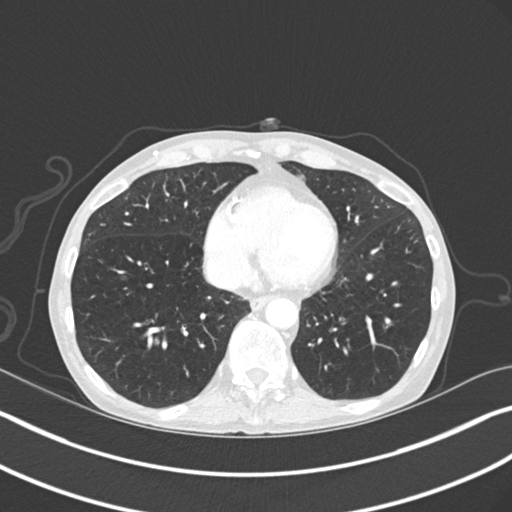
[im 66/171  mediastinal]
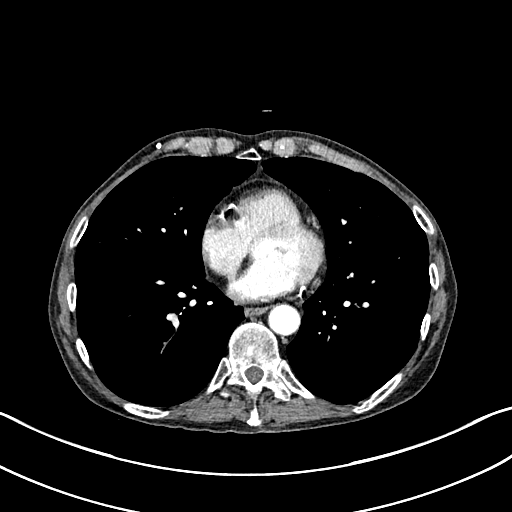
[im 66/171  lung]
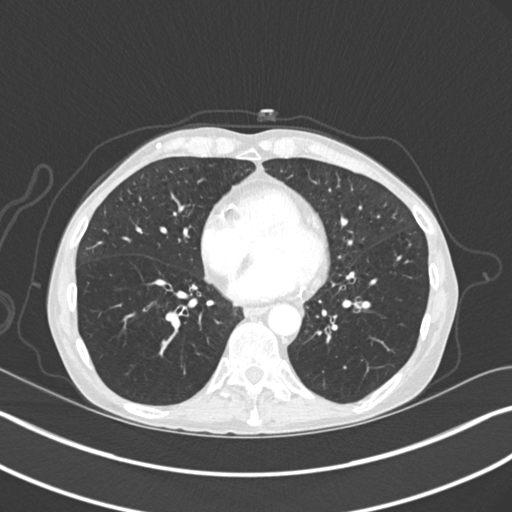
[im 79/171  lung]
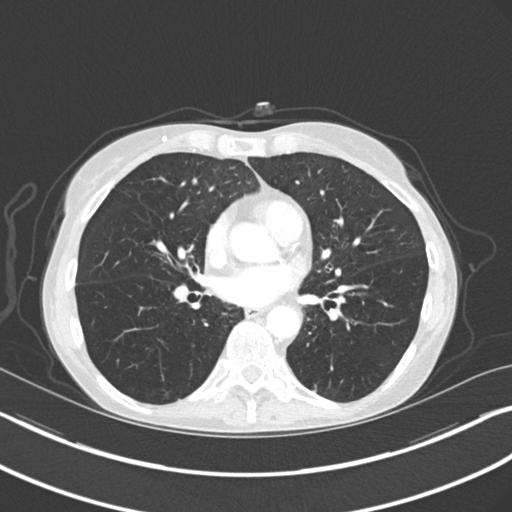
[im 92/171  lung]
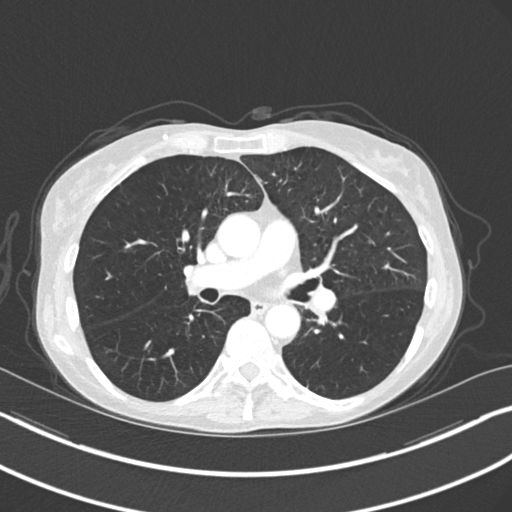
[im 105/171  lung]
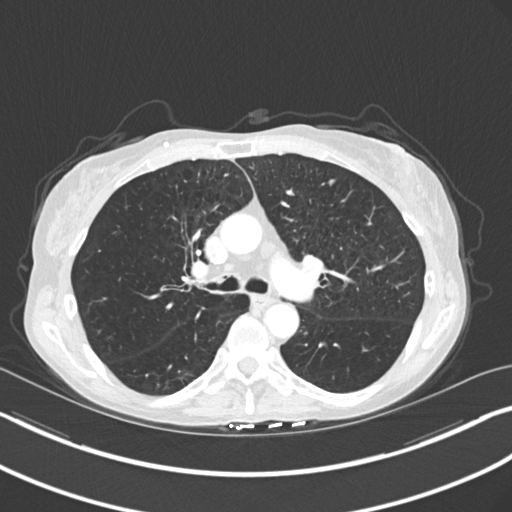
[im 118/171  mediastinal]
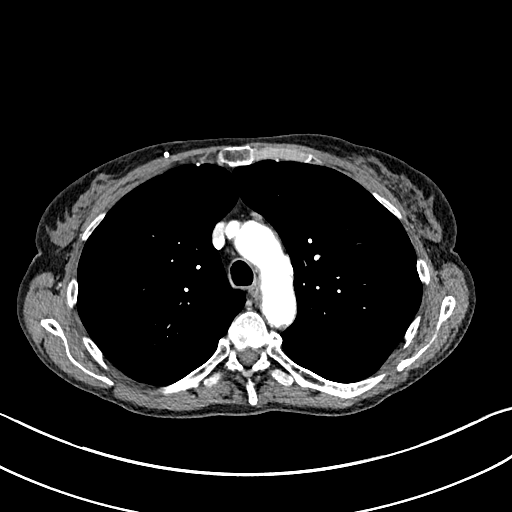
[im 118/171  lung]
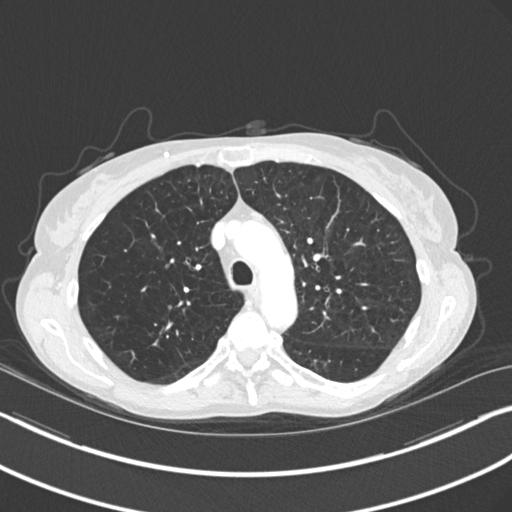
[im 131/171  lung]
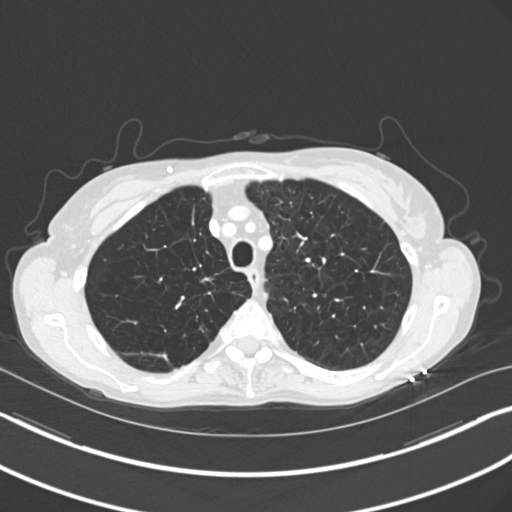
[im 144/171  lung]
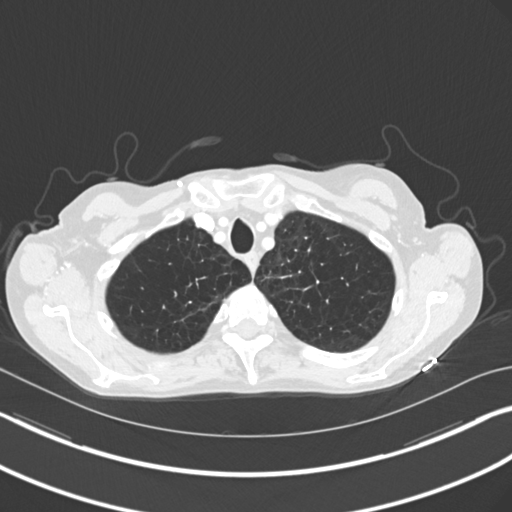
[im 157/171  lung]
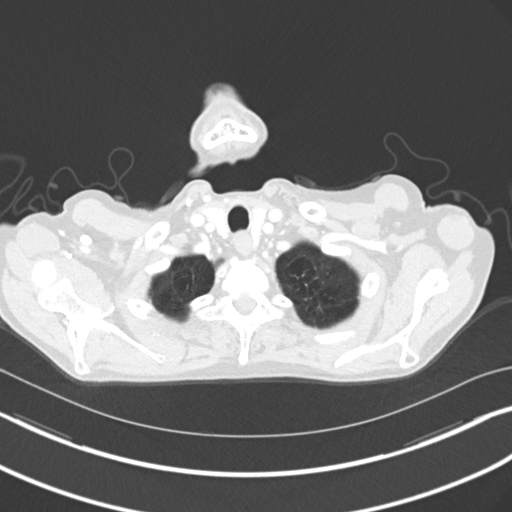

[Series 6: coronal · coronal · 0.72mm/px · 3 of 127 slices shown]
[im 26/127  lung]
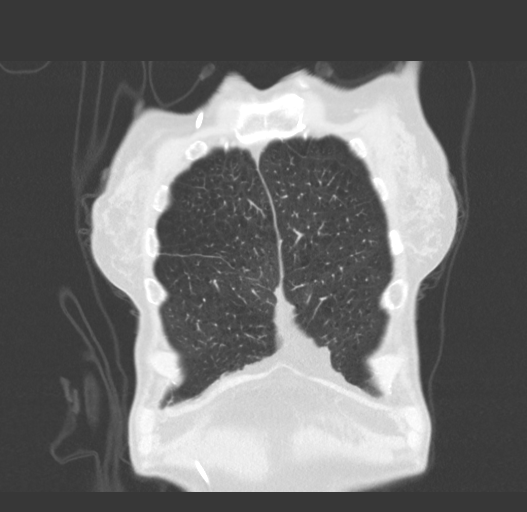
[im 51/127  lung]
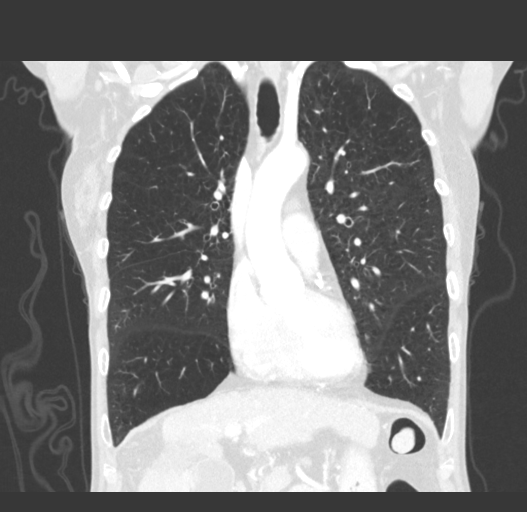
[im 76/127  lung]
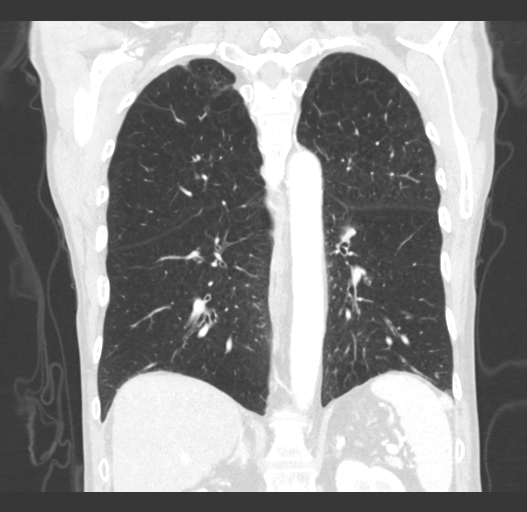

[15 of 36 positions shown; findings below may reference images not displayed]

FINDINGS: Cardiovascular: The heart size appears normal. No pericardial
effusion. Aortic atherosclerosis noted. Calcification in the RCA,
LAD and left circumflex coronary artery noted. No pericardial
effusion.

Mediastinum/Nodes: The trachea appears patent and is midline. Normal
appearance of the esophagus. Or hilar lymph nodes. No axillary or
supraclavicular adenopathy.

Lungs/Pleura: No pleural effusion. Moderate to advanced changes of
centrilobular emphysema identified. Index left upper lobe nodule
measures 1 cm, image 49 of series 5. Previously 1.1 cm peer the
cavitary lesion within the right upper lobe Measures 1.1 by 1.4 cm,
image 50 of series 5. Previously 1.5 x 1.6 cm. No new pulmonary
nodules or masses.

Upper Abdomen: Involving the right lobe of liver is identified
measuring 3.7 by 3.7 cm, image 158 of series 2. No acute abnormality
identified within the upper abdomen.

Musculoskeletal: No aggressive lytic or sclerotic bone lesions
identified.
IMPRESSION: 1. Slight decrease in size of bilateral upper lobe pulmonary
lesions.
2. No new or progressive disease identified within the chest.
3. New ablation defect within the right lobe of liver is identified
corresponding to recent CT guided percutaneous thermal ablation of
subcapsular up of a cellular carcinoma of the right lobe of liver.
4. Aortic atherosclerosis and 3 vessel coronary artery calcification
5. Emphysema

## 2017-03-24 ENCOUNTER — Other Ambulatory Visit: Payer: Self-pay | Admitting: Internal Medicine

## 2017-04-15 ENCOUNTER — Other Ambulatory Visit: Payer: Self-pay | Admitting: Internal Medicine

## 2017-04-26 IMAGING — MR MR ABDOMEN WO/W CM
10 of 17 series · 20 of 48 positions shown · IV contrast (Yes   EOVIST)
Comparison: 05/10/2016

CLINICAL DATA: Status post thermal ablation of liver lesion.

EXAM:
MRI ABDOMEN WITHOUT AND WITH CONTRAST
TECHNIQUE: Multiplanar multisequence MR imaging of the abdomen was performed
both before and after the administration of intravenous contrast.
CONTRAST:  5 cc of Eovist.

[Series 3: ax dualecho · axial · 5.0mm · 0.68mm/px · 1 of 54 slices shown]
[im 1/54]
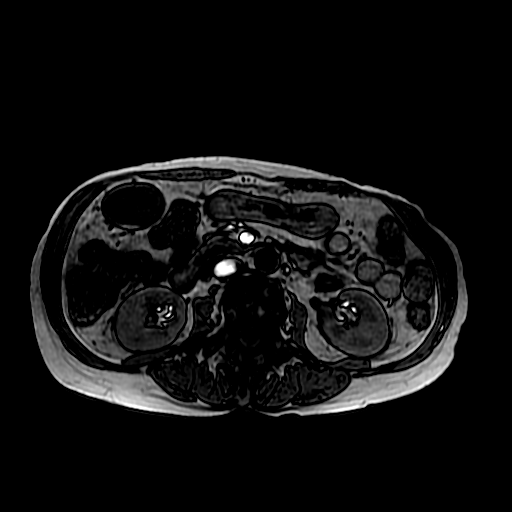

[Series 4: DWI b500 · axial · 6.0mm · 1.48mm/px · 1 of 44 slices shown]
[im 1/44]
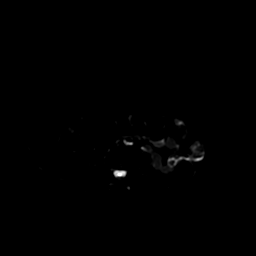

[Series 6: T1 dynamic post-contrast · coronal · 5.0mm · 0.78mm/px · 2 of 72 slices shown]
[im 1/72]
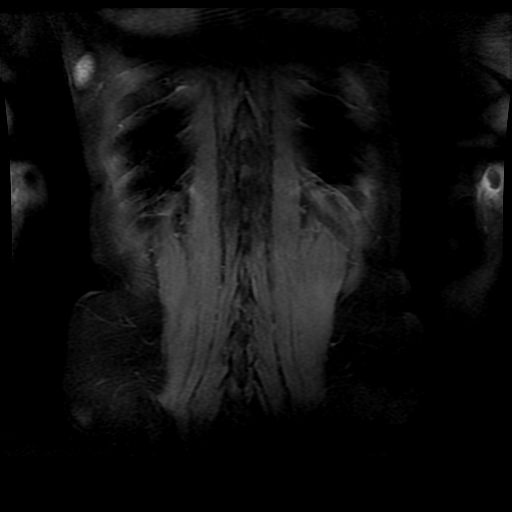
[im 72/72]
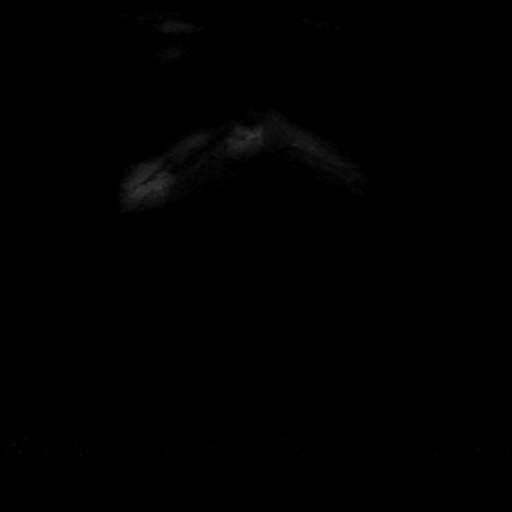

[Series 9: T2 fat-sat · axial · 5.0mm · 0.68mm/px · 1 of 28 slices shown]
[im 1/28]
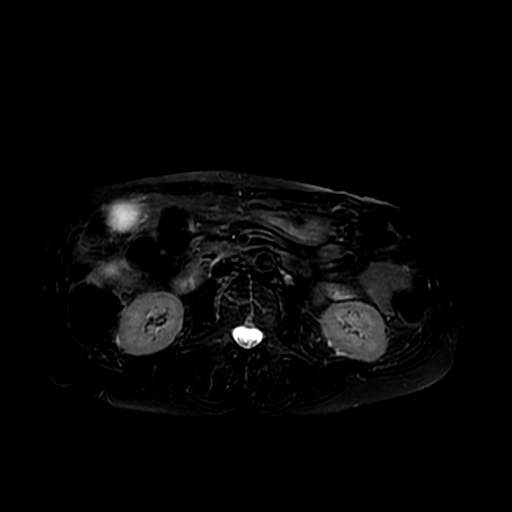

[Series 10: T2 · axial · 5.0mm · 0.68mm/px · 1 of 28 slices shown (1 of 2)]
[im 1/28]
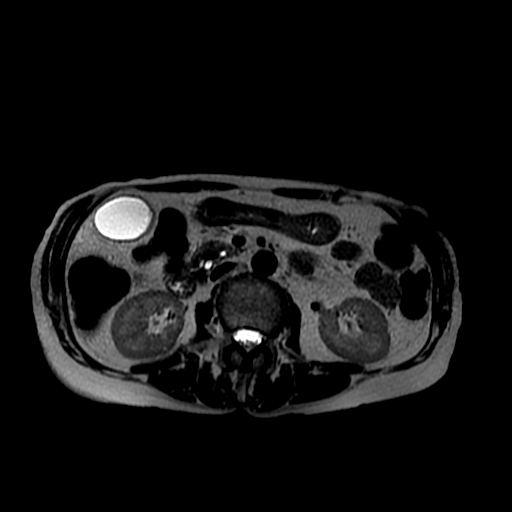

[Series 11: T2 · coronal · 5.0mm · 0.78mm/px · 1 of 37 slices shown (2 of 2)]
[im 1/37]
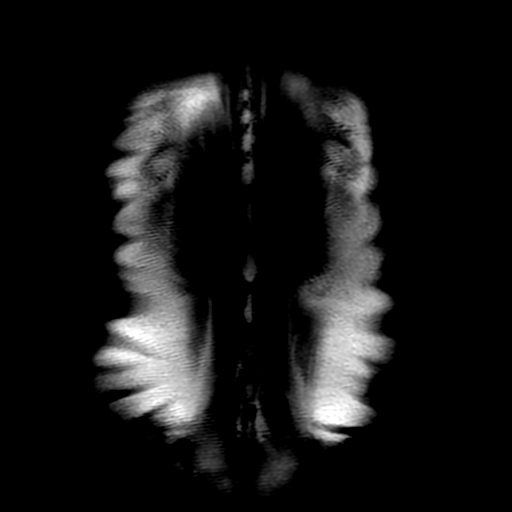

[Series 12: T1 dynamic · axial · 5.0mm · 0.78mm/px · z∈[-132,+66]mm · 4 of 80 slices shown (1 of 3)]
[im 1/80]
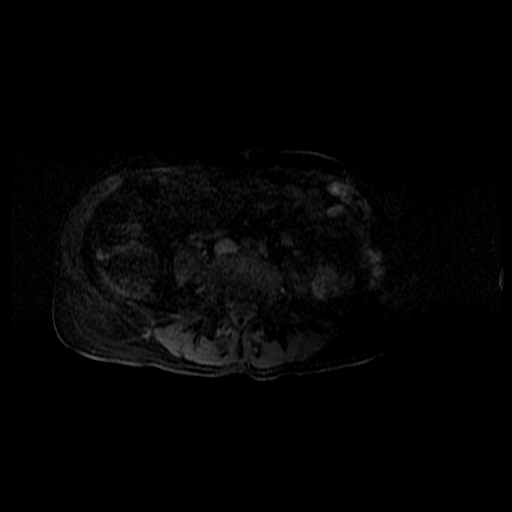
[im 27/80]
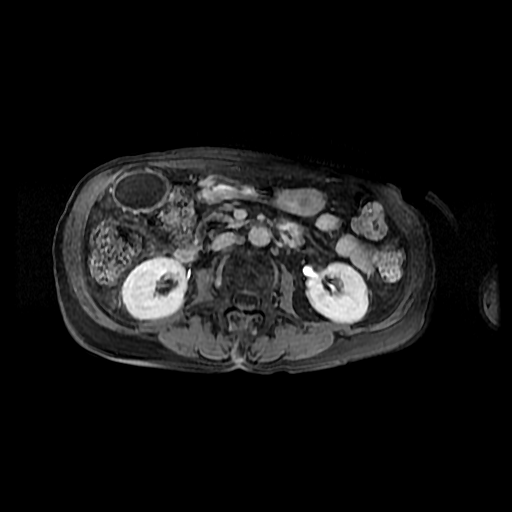
[im 53/80]
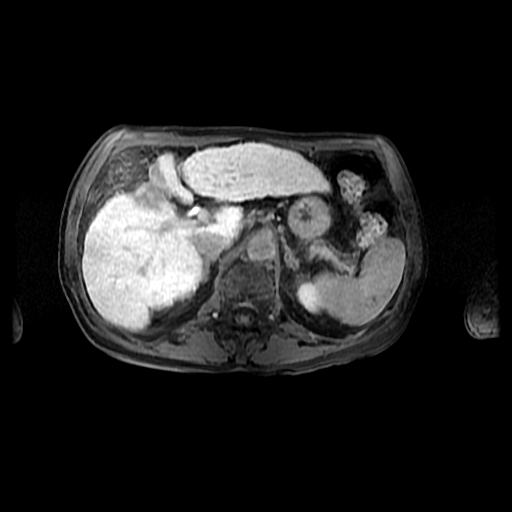
[im 80/80]
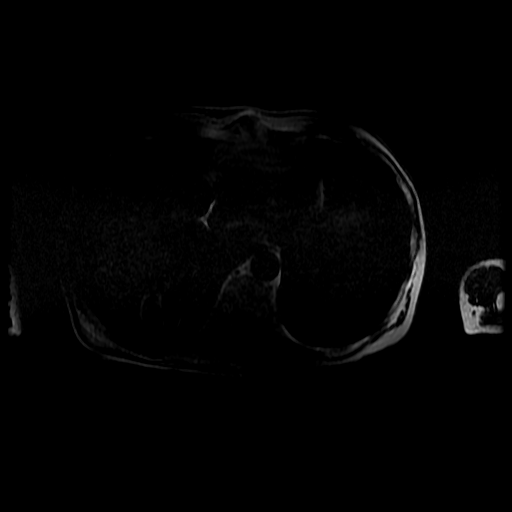

[Series 400: DWI · axial · 6.0mm · 1.48mm/px · 1 of 20 slices shown]
[im 1/20]
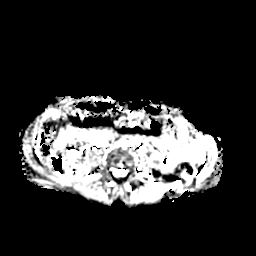

[Series 500: T1 dynamic · axial · 5.0mm · 0.68mm/px · z∈[-133,+65]mm · 4 of 80 slices shown (2 of 3)]
[im 1/80]
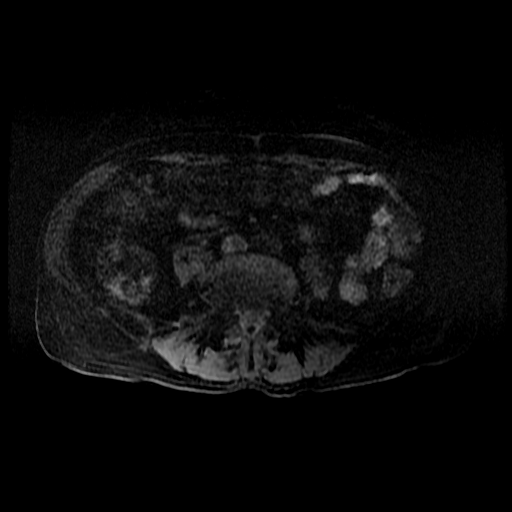
[im 27/80]
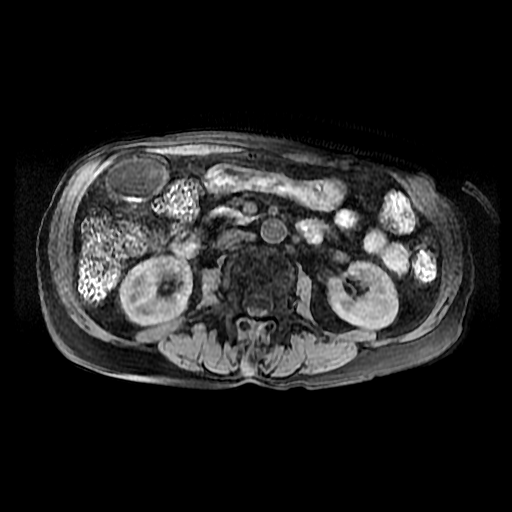
[im 53/80]
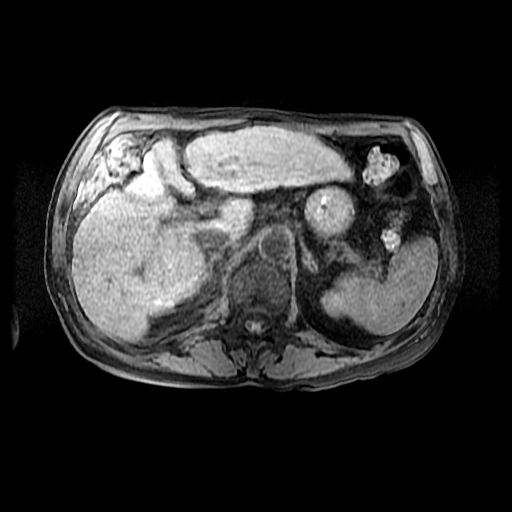
[im 80/80]
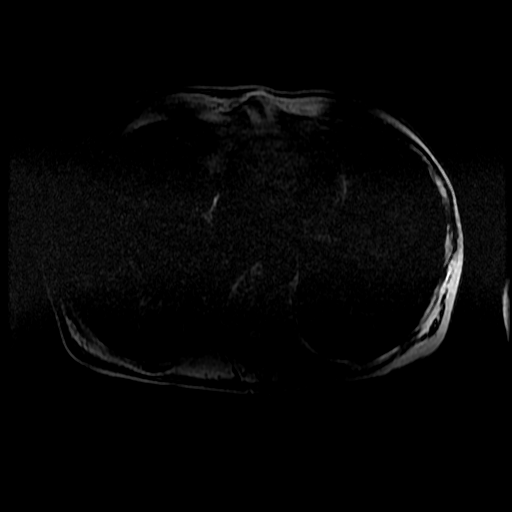

[Series 501: T1 dynamic · axial · 5.0mm · 0.68mm/px · z∈[-133,+65]mm · 4 of 80 slices shown (3 of 3)]
[im 1/80]
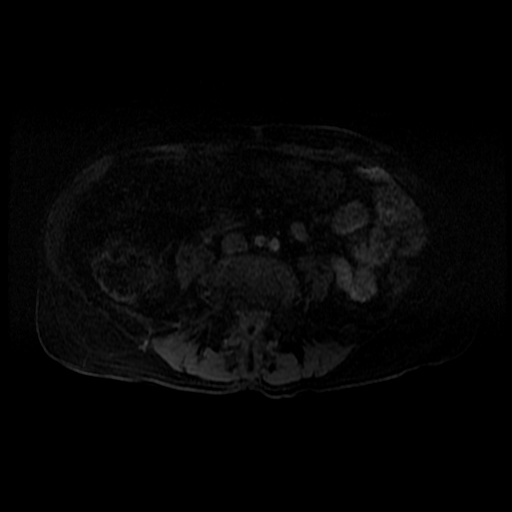
[im 27/80]
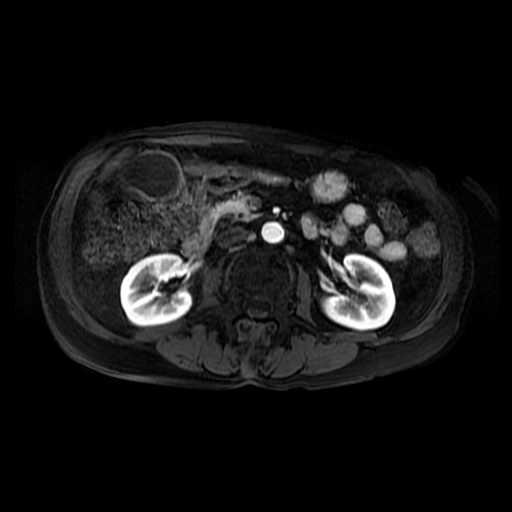
[im 53/80]
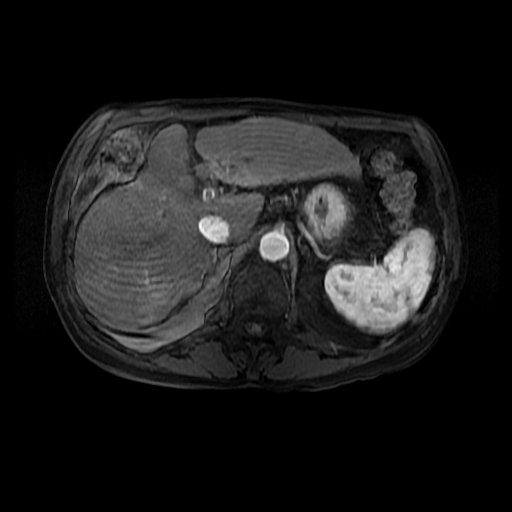
[im 80/80]
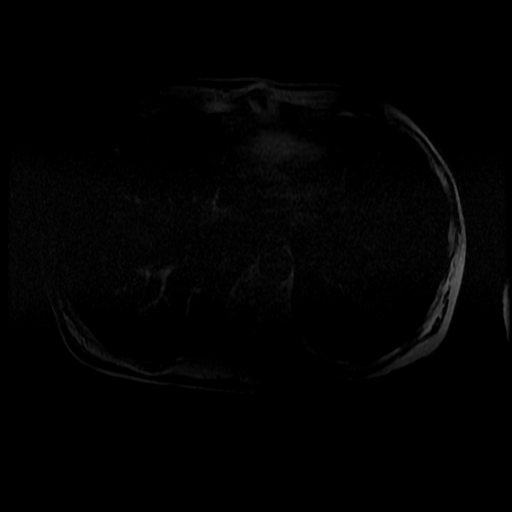

[20 of 48 positions shown; findings below may reference images not displayed]

FINDINGS: Lower chest: No acute findings.

Hepatobiliary: Morphologic feature the liver compatible with
cirrhosis. The liver has a macronodular contour. Hypertrophy of the
lateral segment of left lobe of liver and caudate lobe of liver
noted. There is tiny focus of arterial phase enhancement is
identified within segment 4, image number 37 of series 5. This
measures 6 mm. Otherwise, no specific findings identified to suggest
residual tumor. Ablation defect in segment 5 of the liver measures
3.8 x 2.7 cm, image 33 of series 502. No new enhancing liver lesions
identified. No perihepatic fluid collections identified. The portal
vein remains patent.

Pancreas: No mass, inflammatory changes, or other parenchymal
abnormality identified.

Spleen:  Within normal limits in size and appearance.

Adrenals/Urinary Tract: No masses identified. No evidence of
hydronephrosis.

Stomach/Bowel: Visualized portions within the abdomen are
unremarkable.

Vascular/Lymphatic: Aortic atherosclerosis. Prominent peripancreatic
lymph node Measures 1.7 x 0.8 cm and is unchanged from previous
exam. Prominent portacaval lymph node is unchanged measuring 1.1 x
1.7 cm.

Other:  None.

Musculoskeletal: No suspicious bone lesions identified.
IMPRESSION: 1. Status post thermal ablation of segment 5 liver lesion. No
complicating features identified.
2. No specific findings identified to suggest residual or recurrent
tumor. There is a tiny focus of arterial phase enhancement within
the inferior aspect of segment 4 measuring 6 mm. Attention to this
area on follow-up imaging advise.
3. Morphologic features a liver compatible with cirrhosis.

## 2017-05-02 ENCOUNTER — Other Ambulatory Visit: Payer: Self-pay | Admitting: Gastroenterology

## 2017-05-02 DIAGNOSIS — K7689 Other specified diseases of liver: Secondary | ICD-10-CM

## 2017-05-16 ENCOUNTER — Ambulatory Visit: Payer: Medicaid Other | Admitting: Internal Medicine

## 2017-05-16 ENCOUNTER — Encounter: Payer: Self-pay | Admitting: Internal Medicine

## 2017-05-16 VITALS — BP 120/80 | HR 86 | Ht 68.0 in | Wt 100.4 lb

## 2017-05-16 DIAGNOSIS — J984 Other disorders of lung: Secondary | ICD-10-CM

## 2017-05-16 DIAGNOSIS — F17210 Nicotine dependence, cigarettes, uncomplicated: Secondary | ICD-10-CM | POA: Diagnosis not present

## 2017-05-16 DIAGNOSIS — J449 Chronic obstructive pulmonary disease, unspecified: Secondary | ICD-10-CM

## 2017-05-16 NOTE — Progress Notes (Signed)
Subjective:  Patient ID: Wanda Prince, female   DOB: 29-Dec-1955    MRN: [id number]    Brief patient profile:  49 yowf active smoker  with deteriorating sob x weeks > admit   Date of Admission: 04/22/2015   Date of Discharge: 05/07/2015 Attending Physician: Aldine Contes, MD  Discharge Diagnosis: Active Problems:  COPD with acute exacerbation ([place])  COPD exacerbation ([place])  Acute respiratory failure with hypoxemia ([place])  Encounter for imaging study to confirm orogastric (OG) tube placement  Encounter for nasogastric (NG) tube placement  Acute respiratory failure ([place])  Lung mass  Liver mass   05/18/2015 1st [facility] office visit/ Wanda Prince  maint rx spiriva/advair but very poor technique  Chief Complaint  Patient presents with  . Pulmonary Consult    Referred by Dr. Suzanna Obey. Pt states that she was dxed with COPD years ago. She c/o increased SOB for the past month. She is SOB just sitting and doing nothing and unable to exert herself much due to SOB.   baseline = used [employer] parking / sometimes used scooter sometimes not  / now sob at rest  Has flutter not using/ very congested cough but no purulent mucus  Sleeps on 2 pillows ok Very poor insight into meds/ flutter/ purse lip  rec Pantoprazole (protonix) 40 mg   Take  30-60 min before first meal of the day and Pepcid (famotidine)  20 mg one @  bedtime until return to office - this is the best way to tell whether stomach acid is contributing to your problem.   GERD  Diet   Stop advair and spiriva and just take anoro 2 good drags each am / one click  For breathing >  Nebulizer with albuterol every 4 hours if needed (if you can't catch your breath)  For cough > flutter valve as much as you can         07/24/2015  f/u ov/Wanda Prince re: copd GOLD IV  maint rx anoro  Chief Complaint  Patient presents with  . Follow-up    PFT done at [facility]. Breathing is unchanged. Still using albuterol inhaler 2 x and neb 1 x daily on average.    doe x across the room = MMRC4  = sob if tries to leave home or while getting dressed   rec Pos Quantiferon Gold TB > neg afb smear /culture > set up for [place] for navigational bx November 22 2015 > did not go      02/12/2017  f/u ov/Wanda Prince re:   GOLD IV/ still smoking/ using saba sev times a day  Chief Complaint  Patient presents with  . Follow-up    SOB with activity, weight loss greater than 15 pounds in 6 months   doe still = MMRC3 = can't walk 100 yards even at a slow pace at a flat grade s stopping due to sob  s 02/ HT but not wm leaning  Sleeping ok 2 pillows o/w flat  s am congestion   appetite good / eats well but wt loss/ no fever or sweats  rec Work on inhaler technique:  relax and gently blow all the way out then take a nice smooth deep breath back in, triggering the inhaler at same time you start breathing in.  Hold for up to 5 seconds if you can. Blow out thru nose. Rinse and gargle with water when done Plan A = Automatic = Anoro each am  Plan B = Backup Only use your albuterol(Proair=RED  Yellow= Proventil)  as a rescue medication to be used if you can't catch your breath by resting or doing a relaxed purse lip breathing pattern.  - The less you use it, the better it will work when you need it. - Ok to use the inhaler up to 2 puffs  every 4 hours if you must but call for appointment if use goes up over your usual need - Don't leave home without it !!  (think of it like the spare tire for your car)  Plan C = Crisis - only use your albuterol nebulizer if you first try Plan B   Keep working on cutting down on smoking > The key is to stop smoking completely before smoking completely stops you!     05/16/2017  f/u ov/Wanda Prince re:   GOLD IV COPD / still smoking / no 02  Chief Complaint  Patient presents with  . Follow-up    Pt states she has been doing okay since last visit. Has complaints of SOB with activity and has an earache with left ear x2 weeks off and on. Denies any cough or  CP.   No longer shopping s scooter  = MMRC3 = can't walk 100 yards even at a slow pace at a flat grade s stopping due to sob  maint on anoro and uses saba avg twice daily / not noct    No obvious day to day or daytime variability or assoc excess/ purulent sputum or mucus plugs or hemoptysis or cp or chest tightness, subjective wheeze or overt sinus or hb symptoms. No unusual exposure hx or h/o childhood pna/ asthma or knowledge of premature birth.  Sleeping ok flat without nocturnal  or early am exacerbation  of respiratory  c/o's or need for noct saba. Also denies any obvious fluctuation of symptoms with weather or environmental changes or other aggravating or alleviating factors except as outlined above   Current Allergies, Complete Past Medical History, Past Surgical History, Family History, and Social History were reviewed in [employer] record.  ROS  The following are not active complaints unless bolded Hoarseness, sore throat, dysphagia, dental problems, itching, sneezing,  nasal congestion or discharge of excess mucus or purulent secretions, ear ache,   fever, chills, sweats, unintended wt loss or wt gain, classically pleuritic or exertional cp,  orthopnea pnd or leg swelling, presyncope, palpitations, abdominal pain, anorexia, nausea, vomiting, diarrhea  or change in bowel habits or change in bladder habits, change in stools or change in urine, dysuria, hematuria,  rash, arthralgias, visual complaints, headache, numbness, weakness or ataxia or problems with walking or coordination,  change in mood/affect or memory.        Current Meds  Medication Sig  . acetaminophen (TYLENOL) 500 MG tablet Take 1,000 mg by mouth every 6 (six) hours as needed for pain.  Marland Kitchen albuterol (PROVENTIL HFA;VENTOLIN HFA) 108 (90 BASE) MCG/ACT inhaler Inhale 2 puffs into the lungs every 4 (four) hours as needed for wheezing or shortness of breath.   Marland Kitchen albuterol (PROVENTIL) (2.5 MG/3ML) 0.083%  nebulizer solution USE 1 VIAL IN NEBULIZER EVERY 4 HOURS AS NEEDED FOR WHEEZING OR SHORTNESS OF BREATH  . ANORO ELLIPTA 62.5-25 MCG/INH AEPB INHALE ONE PUFF BY MOUTH ONCE DAILY  . aspirin 81 MG chewable tablet Chew 81 mg by mouth daily.  Marland Kitchen b complex vitamins tablet Take 1 tablet by mouth 2 (two) times daily.  . Cholecalciferol (CVS D3) 2000 units CAPS Take 2,000 Units by mouth daily.  Marland Kitchen  cyclobenzaprine (FLEXERIL) 5 MG tablet Take 5 mg by mouth every 8 (eight) hours as needed for muscle spasms.   . folic acid (FOLVITE) 1 MG tablet Take 1 mg by mouth daily.  Marland Kitchen gabapentin (NEURONTIN) 300 MG capsule Take 600 mg by mouth at bedtime.   . levETIRAcetam (KEPPRA) 500 MG tablet Take 1 tablet (500 mg total) by mouth 2 (two) times daily.  . metoprolol succinate (TOPROL XL) 25 MG 24 hr tablet Take 25 mg by mouth daily.   . Multiple Vitamin (MULITIVITAMIN WITH MINERALS) TABS Take 1 tablet by mouth daily.  . naltrexone (DEPADE) 50 MG tablet Take 50 mg by mouth daily.  . pantoprazole (PROTONIX) 40 MG tablet Take 40 mg by mouth daily.  Marland Kitchen spironolactone (ALDACTONE) 25 MG tablet Take 25 mg by mouth daily.  Marland Kitchen thiamine 100 MG tablet Take 1 tablet (100 mg total) by mouth daily.  . [DISCONTINUED] famotidine (PEPCID) 20 MG tablet Take 20 mg by mouth at bedtime.                Objective:  Physical Exam:  Thin w/c bound for the first time and  mild increased wob at rest    05/16/2017       100  02/12/2017      97  03/18/2016      106  07/24/2015        109   06/01/2015         112   05/07/15 105 lb 4.8 oz (47.764 kg)  01/12/14 115 lb 15.4 oz (52.6 kg)  09/29/12 135 lb (61.236 kg)       LUNGS: no acc muscle use,  slt barrel contour chest   Extremely distant bs marked increased Texp s true wheeze  ABD:  soft and nontender with  Mid to late Pos Hoover's  the supine position. No bruits or organomegaly, bowel sounds nl  HEENT: nl dentition, turbinates bilaterally, and oropharynx.  external ear canals with  wax bilaterally without cough reflex   NECK :  without JVD/Nodes/TM/ nl carotid upstrokes bilaterally   LUNGS: no acc muscle use,  Barrel contour, extremely distant bs bilaterally with minimal exp rhonchi    CV:  RRR  no s3 or murmur or increase in P2, and no edema   ABD:  soft and nontender with pos early insp hoover's in the supine position. No bruits or organomegaly appreciated, bowel sounds nl  MS:  Nl gait/ ext warm without deformities, calf tenderness, cyanosis or clubbing No obvious joint restrictions / pos gen muscle wasting  SKIN: warm and dry without lesions    NEURO:  alert, approp, nl sensorium with  no motor or cerebellar deficits apparent.         Assessment:

## 2017-05-16 NOTE — Patient Instructions (Addendum)
Work on inhaler technique:  relax and gently blow all the way out then take a nice smooth deep breath back in, triggering the inhaler at same time you start breathing in.  Hold for up to 5 seconds if you can. Blow out thru nose. Rinse and gargle with water when done     The key is to stop smoking completely before smoking completely stops you!    Please schedule a follow up visit in 3 months but call sooner if needed with cxr on return

## 2017-05-17 ENCOUNTER — Encounter: Payer: Self-pay | Admitting: Internal Medicine

## 2017-05-17 NOTE — Assessment & Plan Note (Signed)
See ct 05/05/15  - PET 06/22/2015 1. 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 2. Mildly hypermetabolic nodule of the LEFT lung base which is decreased slightly in size in short interval favors infectious or inflammatory process. 3. No mediastinal lymphadenopathy metastatic mild hypermetabolic mediastinal adenopathy. - Quant Gold 07/24/2015 > POS > referred to HD > afb neg smear/cultures collected 07/29/15 x 3 faxed to pulmo  09/22/15  >  09/26/2015  referred to Dr Lamonte Sakai ? Navigational bx with afb stain/culture before considering RT?  - CT 11/03/15 > no change in RUL lesion > F/u Dr Lisbeth Renshaw > never RT'd  Based on near endstage copd this is a moot issue for now > follow up cxr in 3 m  Discussed in detail all the  indications, usual  risks and alternatives  relative to the benefits with patient who agrees to proceed with conservative f/u as outlined

## 2017-05-17 NOTE — Assessment & Plan Note (Signed)
>   3 min   > 3 min Discussed the risks and costs (both direct and indirect)  of smoking relative to the benefits of quitting but patient unwilling to commit at this point to a specific quit date.     Marland Kitchen

## 2017-05-17 NOTE — Assessment & Plan Note (Addendum)
-   trial of Anoro  05/18/15 > better doe  06/01/2015  - Spirometry 06/01/2015  FEV1 0.54 (19%)  Ratio 39   - PFT's  07/24/2015  FEV1 0.65 (21 % ) ratio 29  p no % improvement from saba p anoro am prior to study with DLCO  12 % corrects to 23 % for alv volume   - 10/24/2015  After extensive coaching HFA effectiveness =    75%   - 05/16/2017  After extensive coaching HFA effectiveness =    75% (short Ti)   Continues to be Group B in terms of symptom/risk and laba/lama therefore appropriate rx at this point and really nothing else to offer other than advice to stop smoking now   Though somewhat paradoxic, when the lung fails to clear C02 properly and pC02 rises the lung then becomes a more efficient scavenger of C02 allowing lower work of breathing and  better C02 clearance albeit at a higher serum pC02 level - this is why pts can look a lot better than their ABG's would suggest and why it's so difficult to prognosticate endstage dz.  It's also why I strongly rec DNI status (ventilating pts down to a nl pC02 adversely affects this compensatory mechanism)   Will need to start considering eol issues soon but I did not address them today.   I had an extended discussion with the patient reviewing all relevant studies completed to date and  lasting 15 to 20 minutes of a 25 minute visit    Each maintenance medication was reviewed in detail including most importantly the difference between maintenance and prns and under what circumstances the prns are to be triggered using an action plan format that is not reflected in the computer generated alphabetically organized AVS.    Please see AVS for specific instructions unique to this visit that I personally wrote and verbalized to the the pt in detail and then reviewed with pt  by my nurse highlighting any  changes in therapy recommended at today's visit to their plan of care.

## 2017-05-21 ENCOUNTER — Other Ambulatory Visit: Payer: Self-pay | Admitting: Internal Medicine

## 2017-06-02 ENCOUNTER — Ambulatory Visit
Admission: RE | Admit: 2017-06-02 | Discharge: 2017-06-02 | Disposition: A | Discharge status: Home / Self Care | Payer: Medicaid Other | Source: Ambulatory Visit | Attending: Gastroenterology | Admitting: Gastroenterology

## 2017-06-02 DIAGNOSIS — K7689 Other specified diseases of liver: Secondary | ICD-10-CM

## 2017-06-02 MED ORDER — GADOXETATE DISODIUM 0.25 MMOL/ML IV SOLN
4.0000 mL | Freq: Once | INTRAVENOUS | Status: AC | PRN
  Administered 2017-06-02: 4 mL via INTRAVENOUS

## 2017-06-17 ENCOUNTER — Telehealth: Payer: Self-pay | Admitting: Internal Medicine

## 2017-06-17 MED ORDER — GLYCOPYRROLATE-FORMOTEROL 9-4.8 MCG/ACT IN AERO: 2.0000 | INHALATION_SPRAY | Freq: Two times a day (BID) | RESPIRATORY_TRACT | 2 refills | Status: DC

## 2017-06-17 NOTE — Telephone Encounter (Signed)
Try bevespi 2bid and f/u ov before the rx expires to be sure it'll work for her

## 2017-06-17 NOTE — Telephone Encounter (Signed)
Spoke with pt's sister Vickii Chafe (dpr on file), aware of rx change.  Chart updated and rx sent to preferred pharmacy.  Nothing further needed.

## 2017-06-17 NOTE — Telephone Encounter (Signed)
Received notification from [place] that [facility] is not a preferred medication for coverage. The preferred medications are: Atrovent HFA, Bevespi, Combivent Respimat, Spiriva Handihaler, and Stiolto.   Dr. Melvyn Novas please advise on if you would like [place] to continue with PA or if you would like to order any of the other medications.   Routing this message to [place] to follow up on.

## 2017-06-18 ENCOUNTER — Encounter (HOSPITAL_COMMUNITY): Payer: Self-pay | Admitting: Emergency Medicine

## 2017-06-18 ENCOUNTER — Other Ambulatory Visit: Payer: Self-pay

## 2017-06-18 ENCOUNTER — Emergency Department (HOSPITAL_COMMUNITY)
Admission: EM | Admit: 2017-06-18 | Discharge: 2017-06-18 | Disposition: A | Discharge status: Home / Self Care | Payer: Medicaid Other | Attending: Emergency Medicine | Admitting: Emergency Medicine

## 2017-06-18 DIAGNOSIS — I10 Essential (primary) hypertension: Secondary | ICD-10-CM | POA: Diagnosis not present

## 2017-06-18 DIAGNOSIS — I2510 Atherosclerotic heart disease of native coronary artery without angina pectoris: Secondary | ICD-10-CM | POA: Diagnosis not present

## 2017-06-18 DIAGNOSIS — R202 Paresthesia of skin: Secondary | ICD-10-CM | POA: Insufficient documentation

## 2017-06-18 DIAGNOSIS — J449 Chronic obstructive pulmonary disease, unspecified: Secondary | ICD-10-CM | POA: Diagnosis not present

## 2017-06-18 DIAGNOSIS — F17210 Nicotine dependence, cigarettes, uncomplicated: Secondary | ICD-10-CM | POA: Diagnosis not present

## 2017-06-18 DIAGNOSIS — Z79899 Other long term (current) drug therapy: Secondary | ICD-10-CM | POA: Diagnosis not present

## 2017-06-18 LAB — CBC WITH DIFFERENTIAL/PLATELET
Basophils Absolute: 0 10*3/uL (ref 0.0–0.1)
Basophils Relative: 0 %
Eosinophils Absolute: 0.1 10*3/uL (ref 0.0–0.7)
Eosinophils Relative: 2 %
HCT: 47.1 % — ABNORMAL HIGH (ref 36.0–46.0)
Hemoglobin: 15.8 g/dL — ABNORMAL HIGH (ref 12.0–15.0)
Lymphocytes Relative: 33 %
Lymphs Abs: 2 10*3/uL (ref 0.7–4.0)
MCH: 32 pg (ref 26.0–34.0)
MCHC: 33.5 g/dL (ref 30.0–36.0)
MCV: 95.3 fL (ref 78.0–100.0)
Monocytes Absolute: 0.4 10*3/uL (ref 0.1–1.0)
Monocytes Relative: 7 %
Neutro Abs: 3.5 10*3/uL (ref 1.7–7.7)
Neutrophils Relative %: 58 %
Platelets: 143 10*3/uL — ABNORMAL LOW (ref 150–400)
RBC: 4.94 MIL/uL (ref 3.87–5.11)
RDW: 13.6 % (ref 11.5–15.5)
WBC: 6.1 10*3/uL (ref 4.0–10.5)

## 2017-06-18 LAB — ETHANOL: Alcohol, Ethyl (B): <10 mg/dL (ref <10)

## 2017-06-18 LAB — I-STAT CHEM 8, ED
BUN: 11 mg/dL (ref 6–20)
Calcium, Ion: 1.07 mmol/L — ABNORMAL LOW (ref 1.15–1.40)
Chloride: 98 mmol/L — ABNORMAL LOW (ref 101–111)
Creatinine, Ser: 0.6 mg/dL (ref 0.44–1.00)
Glucose, Bld: 92 mg/dL (ref 65–99)
HCT: 48 % — ABNORMAL HIGH (ref 36.0–46.0)
Hemoglobin: 16.3 g/dL — ABNORMAL HIGH (ref 12.0–15.0)
Potassium: 4.3 mmol/L (ref 3.5–5.1)
Sodium: 141 mmol/L (ref 135–145)
TCO2: 35 mmol/L — ABNORMAL HIGH (ref 22–32)

## 2017-06-18 LAB — COMPREHENSIVE METABOLIC PANEL
ALT: 38 U/L (ref 14–54)
AST: 49 U/L — ABNORMAL HIGH (ref 15–41)
Albumin: 3.7 g/dL (ref 3.5–5.0)
Alkaline Phosphatase: 121 U/L (ref 38–126)
Anion gap: 13 (ref 5–15)
BUN: 9 mg/dL (ref 6–20)
CO2: 29 mmol/L (ref 22–32)
Calcium: 9.3 mg/dL (ref 8.9–10.3)
Chloride: 99 mmol/L — ABNORMAL LOW (ref 101–111)
Creatinine, Ser: 0.66 mg/dL (ref 0.44–1.00)
GFR calc Af Amer: >60 mL/min (ref >60)
GFR calc non Af Amer: >60 mL/min (ref >60)
Glucose, Bld: 97 mg/dL (ref 65–99)
Potassium: 4.3 mmol/L (ref 3.5–5.1)
Sodium: 141 mmol/L (ref 135–145)
Total Bilirubin: 0.6 mg/dL (ref 0.3–1.2)
Total Protein: 6.8 g/dL (ref 6.5–8.1)

## 2017-06-18 MED ORDER — LORAZEPAM 1 MG PO TABS
1.0000 mg | ORAL_TABLET | Freq: Once | ORAL | Status: AC
  Administered 2017-06-18: 1 mg via ORAL
  Filled 2017-06-18 (×2): qty 1

## 2017-06-18 NOTE — ED Triage Notes (Signed)
Pt reports her feet began to "tingle" around 10 pm and continue to do so.  Negative NIH score.  Pt feet are warm w/ good pulses.  Pt is able to walk on feet.

## 2017-06-18 NOTE — ED Provider Notes (Signed)
[facility] DE[facility] Provider Note   CSN: [id number] Arrival date & time: 06/18/17  6503     History   Chief Complaint Chief Complaint  Patient presents with  . feet numbness    HPI Wanda Prince is a 62 y.o. female.  The history is provided by the patient.  Illness  This is a new problem. The current episode started 6 to 12 hours ago. The problem occurs constantly. The problem has not changed since onset.Pertinent negatives include no chest pain, no abdominal pain, no headaches and no shortness of breath. Nothing aggravates the symptoms. Nothing relieves the symptoms. She has tried nothing for the symptoms.   Presents with numbness to both feet.  She reports about 10 hours ago she began having numbness on the tops and bottoms of both feet. No new weakness.  No arm weakness or numbness.  No new headache.  No new vision changes.  No fever/vomiting/diarrhea.  No new back pain.  There is no pain that radiates into her legs.  She is able to ambulate.  Denies history of diabetes.  Denies history of restless legs.  Denies trauma. She does take gabapentin, but reports it is for pain. Denies new medications.  No new environmental exposures. Past Medical History:  Diagnosis Date  . 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 COPD (chronic obstructive pulmonary disease) ([place])   . H/O tracheostomy 04/22/2015   respiratory destress   . History of acute alcoholic hepatitis   . History of alcohol abuse   . History of cocaine abuse   . History of iron deficiency   . Hx of ventricular shunt 2009  . Hypertension   . Left breast mass   . Major depressive disorder    History of  . Meningitis 2009  . Primary cancer of right upper lobe of lung ([place]) 07/12/2015  . Psychiatric diagnosis    Multiple  . 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 Seizure disorder ([place])   . Seizures ([place])   . Suicide attempt [facility])    History of suicide attempts August 2006 and July 2006    Patient Active Problem List   Diagnosis Date Noted  . Liver lesion 05/11/2016  . Hepatocellular carcinoma ([place]) 05/10/2016  . 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/01/2016  . DOE (dyspnea on exertion) 04/01/2016  . Preoperative cardiovascular examination 04/01/2016  . Primary cancer of right upper lobe of lung ([place]) 07/12/2015  . COPD  GOLD IV  05/18/2015  . Acute respiratory failure ([place])   . Cavitating mass in right upper lung lobe   . Liver mass   . Encounter for imaging study to confirm orogastric (OG) tube placement   . Encounter for nasogastric (NG) tube placement   . Acute respiratory failure with hypoxemia ([place])   . COPD exacerbation ([place]) 04/22/2015  . COPD with acute exacerbation ([place]) 01/10/2014  . Acute respiratory failure with hypoxia and hypercapnia ([place]) 01/10/2014  . Essential hypertension 01/10/2014  . Seizure disorder ([place]) 01/25/2012  . Coronary artery disease, non-occlusive 06/07/2011  . Cigarette smoker 06/04/2011  . Alcohol abuse 06/04/2011  . History of Abscess, brain 06/03/2011  . History of suicide attempt 06/03/2011  . Intracranial hemorrhage ([place]) 06/03/2011    Past Surgical History:  Procedure Laterality Date  . BACK SURGERY    .  BRAIN SURGERY    . BREAST EXCISIONAL BIOPSY Right 1982  . breast mass removal    . BREAST SURGERY    . CRANIOTOMY    . IR GENERIC HISTORICAL  02/28/2016   IR RADIOLOGIST EVAL & MGMT 02/28/2016 Aletta Edouard, MD [facility] INTER[facility]  . IR GENERIC HISTORICAL  05/10/2016   IR VENIPUNCTURE 22YRS/OLDER BY MD [facility]  . IR GENERIC HISTORICAL  05/10/2016   IR US GUIDE VASC ACCESS RIGHT [facility]  . IR GENERIC HISTORICAL  06/26/2016   IR RADIOLOGIST EVAL & MGMT 06/26/2016 Aletta Edouard, MD [facility] INTERV RAD  . LEFT HEART CATHETERIZATION WITH CORONARY  ANGIOGRAM N/A 06/06/2011   Procedure: LEFT HEART CATHETERIZATION WITH CORONARY ANGIOGRAM;  Surgeon: Burnell Blanks, MD;  Location: [facility] CATH LAB: Moderate ~ 40% RCA disease noted. Otherwise normal  . RADIOLOGY WITH ANESTHESIA N/A 05/10/2016   Procedure: Microwave thermal ablation liver;  Surgeon: Aletta Edouard, MD;  Location: [facility];  Service: Radiology;  Laterality: N/A;  . TRANSTHORACIC ECHOCARDIOGRAM  05/2011   Insetting of seizure, positive troponin. EF 30-35% with anterior hypokinesis.  . TRANSTHORACIC ECHOCARDIOGRAM  12/2013   EF 60-65%. GR 1 DD. No significant valvular disease.    OB History    Gravida Para Term Preterm AB Living   2 1     1      SAB TAB Ectopic Multiple Live Births                   Home Medications    Prior to Admission medications   Medication Sig Start Date End Date Taking? Authorizing Provider  acetaminophen (TYLENOL) 500 MG tablet Take 1,000 mg by mouth every 6 (six) hours as needed for pain.    [provider]  albuterol (PROVENTIL HFA;VENTOLIN HFA) 108 (90 BASE) MCG/ACT inhaler Inhale 2 puffs into the lungs every 4 (four) hours as needed for wheezing or shortness of breath.     [provider]  albuterol (PROVENTIL) (2.5 MG/3ML) 0.083% nebulizer solution USE 1 VIAL IN NEBULIZER EVERY 4 HOURS AS NEEDED FOR WHEEZING OR SHORTNESS OF BREATH 03/24/17   Tanda Rockers, MD  aspirin 81 MG chewable tablet Chew 81 mg by mouth daily.    [provider]  b complex vitamins tablet Take 1 tablet by mouth 2 (two) times daily. 01/28/12   Eugenie Filler, MD  Cholecalciferol (CVS D3) 2000 units CAPS Take 2,000 Units by mouth daily.    [provider]  cyclobenzaprine (FLEXERIL) 5 MG tablet Take 5 mg by mouth every 8 (eight) hours as needed for muscle spasms.  11/22/13   [provider]  folic acid (FOLVITE) 1 MG tablet Take 1 mg by mouth daily.    [provider]  gabapentin (NEURONTIN) 300 MG capsule Take 600 mg  by mouth at bedtime.     [provider]  Glycopyrrolate-Formoterol (BEVESPI AEROSPHERE) 9-4.8 MCG/ACT AERO Inhale 2 puffs into the lungs 2 (two) times daily. 06/17/17   Tanda Rockers, MD  levETIRAcetam (KEPPRA) 500 MG tablet Take 1 tablet (500 mg total) by mouth 2 (two) times daily. 01/28/12   Eugenie Filler, MD  metoprolol succinate (TOPROL XL) 25 MG 24 hr tablet Take 25 mg by mouth daily.  09/30/13   [provider]  Multiple Vitamin (MULITIVITAMIN WITH MINERALS) TABS Take 1 tablet by mouth daily. 06/07/11   Dorian Heckle, MD  naltrexone (DEPADE) 50 MG tablet Take 50 mg by mouth daily.  [provider]  pantoprazole (PROTONIX) 40 MG tablet Take 40 mg by mouth daily.    [provider]  spironolactone (ALDACTONE) 25 MG tablet Take 25 mg by mouth daily.  12/08/13 02/12/17  [provider]  spironolactone (ALDACTONE) 25 MG tablet Take 25 mg by mouth daily.    [provider]  thiamine 100 MG tablet Take 1 tablet (100 mg total) by mouth daily. 06/07/11   Dorian Heckle, MD    Family History Family History  Problem Relation Age of Onset  . Coronary artery disease Father   . Breast cancer Maternal Aunt 60    Social History Social History   Tobacco Use  . Smoking status: Current Every Day Smoker    Packs/day: 1.00    Years: 40.00    Pack years: 40.00    Types: Cigarettes  . Smokeless tobacco: Never Used  Substance Use Topics  . Alcohol use: Yes    Alcohol/week: 4.8 oz    Types: 8 Cans of beer per week    Comment: per week  . Drug use: Yes    Types: Cocaine    Comment: not in years     Allergies   Patient has no known allergies.   Review of Systems Review of Systems  Constitutional: Negative for fever.  Respiratory: Negative for shortness of breath.   Cardiovascular: Negative for chest pain.  Gastrointestinal: Negative for abdominal pain.  Musculoskeletal: Negative for back pain.  Neurological: Positive for  numbness. Negative for weakness and headaches.  All other systems reviewed and are negative.    Physical Exam Updated Vital Signs BP 135/78   Pulse 76   Temp 97.6 F (36.4 C) (Oral)   Resp 16   Ht 1.727 m (5\' 8" )   Wt 53.1 kg (117 lb)   LMP  (LMP Unknown)   SpO2 98%   BMI 17.79 kg/m   Physical Exam CONSTITUTIONAL: Chronically ill-appearing, appears older than stated age HEAD: Normocephalic/atraumatic EYES: EOMI/PERRL ENMT: Mucous membranes moist NECK: supple no meningeal signs SPINE/BACK:entire spine nontender CV: S1/S2 noted, no murmurs/rubs/gallops noted LUNGS: Lungs are clear to auscultation bilaterally, no apparent distress ABDOMEN: soft, nontender, no rebound or guarding, bowel sounds noted throughout abdomen GU:no cva tenderness NEURO: Pt is awake/alert/appropriate, moves all extremitiesx4.  No facial droop.  She can ambulate.  No arm or leg drift. equal distal motor 5/5 strength noted with the following: hip flexion/knee flexion/extension, ankle dorsi/plantar flexion, great toe extension intact bilaterally, no clonus bilaterally, plantar reflex appropriate (toes downgoing), no sensory deficit in any dermatome.  Equal patellar/achilles reflex noted (2+) in bilateral lower extremities.  Patient reports subjective numbness and tingling to both feet, but no sensory deficit.  Denies any symptoms proximal to her feet She appears mildly anxious.  She has a mild tremor. EXTREMITIES: pulses normal/equal, full ROM, feet are warm to touch.  There is no discoloration to either foot. SKIN: warm, color normal PSYCH: Anxious   ED Treatments / Results  Labs (all labs ordered are listed, but only abnormal results are displayed) Labs Reviewed  CBC WITH DIFFERENTIAL/PLATELET - Abnormal; Notable for the following components:      Result Value   Hemoglobin 15.8 (*)    HCT 47.1 (*)    Platelets 143 (*)    All other components within normal limits  COMPREHENSIVE METABOLIC PANEL -  Abnormal; Notable for the following components:   Chloride 99 (*)    AST 49 (*)    All other components within normal  limits  I-STAT CHEM 8, ED - Abnormal; Notable for the following components:   Chloride 98 (*)    Calcium, Ion 1.07 (*)    TCO2 35 (*)    Hemoglobin 16.3 (*)    HCT 48.0 (*)    All other components within normal limits  ETHANOL    EKG  EKG Interpretation None       Radiology No results found.  Procedures Procedures (including critical care time)  Medications Ordered in ED Medications  LORazepam (ATIVAN) tablet 1 mg (1 mg Oral Given 06/18/17 0555)     Initial Impression / Assessment and Plan / ED Course  I have reviewed the triage vital signs and the nursing notes.  Pertinent labs results that were available during my care of the patient were reviewed by me and considered in my medical decision making (see chart for details).     6:02 AM Patient presents with subjective numbness and burning to both feet.  She is currently neurovascularly intact She denies history of neuropathy, but she is on gabapentin which she states is for pain When asked about alcohol, she became evasive. She has long history of chronic medical conditions including cirrhosis, alcohol abuse. We will obtain labs and reassess 7:41 AM Patient improved.  She is resting comfortably.  No distress noted.  No neuro deficits.  She has equal strength in bilateral lower extremities.  Distal pulses are intact.  My suspicion for acute neurologic emergency is low.  No signs of acute myelopathy. Advised PCP follow-up in 2 weeks if no improvement. We discussed strict ER return precautions, and this was provided via teach back as well as on her paperwork Final Clinical Impressions(s) / ED Diagnoses   Final diagnoses:  Paresthesia of both feet    ED Discharge Orders    None       Ripley Fraise, MD 06/18/17 [phone]

## 2017-06-18 NOTE — Discharge Instructions (Signed)
As we discussed, please follow-up with Dr. Doreene Nest in 2 weeks if you continue to have the symptoms.  If your numbness worsens or moves up your leg, if you have new weakness in your legs, if you cannot walk, please call [phone] return to the ER immediately

## 2017-08-15 ENCOUNTER — Ambulatory Visit: Payer: Medicaid Other | Admitting: Internal Medicine

## 2017-08-18 ENCOUNTER — Encounter: Payer: Self-pay | Admitting: Internal Medicine

## 2017-08-18 ENCOUNTER — Ambulatory Visit (INDEPENDENT_AMBULATORY_CARE_PROVIDER_SITE_OTHER)
Admission: RE | Admit: 2017-08-18 | Discharge: 2017-08-18 | Disposition: A | Discharge status: Home / Self Care | Payer: Medicaid Other | Source: Ambulatory Visit | Attending: Internal Medicine | Admitting: Internal Medicine

## 2017-08-18 ENCOUNTER — Ambulatory Visit: Payer: Medicaid Other | Admitting: Internal Medicine

## 2017-08-18 VITALS — BP 132/70 | HR 83 | Ht 68.0 in | Wt 96.8 lb

## 2017-08-18 DIAGNOSIS — F17210 Nicotine dependence, cigarettes, uncomplicated: Secondary | ICD-10-CM

## 2017-08-18 DIAGNOSIS — J449 Chronic obstructive pulmonary disease, unspecified: Secondary | ICD-10-CM

## 2017-08-18 DIAGNOSIS — J984 Other disorders of lung: Secondary | ICD-10-CM

## 2017-08-18 MED ORDER — GLYCOPYRROLATE-FORMOTEROL 9-4.8 MCG/ACT IN AERO: 2.0000 | INHALATION_SPRAY | Freq: Two times a day (BID) | RESPIRATORY_TRACT | 0 refills | Status: DC

## 2017-08-18 NOTE — Progress Notes (Signed)
Subjective:  Patient ID: Wanda Prince, female   DOB: 1955/09/12    MRN: [id number]    Brief patient profile:  89 yowf active smoker  with deteriorating sob x weeks > admit   Date of Admission: 04/22/2015   Date of Discharge: 05/07/2015 Attending Physician: Aldine Contes, MD  Discharge Diagnosis: Active Problems:  COPD with acute exacerbation ([place])  COPD exacerbation ([place])  Acute respiratory failure with hypoxemia ([place])  Encounter for imaging study to confirm orogastric (OG) tube placement  Encounter for nasogastric (NG) tube placement  Acute respiratory failure ([place])  Lung mass  Liver mass   05/18/2015 1st [facility] office visit/ Wanda Prince  maint rx spiriva/advair but very poor technique  Chief Complaint  Patient presents with  . Pulmonary Consult    Referred by Dr. Suzanna Obey. Pt states that she was dxed with COPD years ago. She c/o increased SOB for the past month. She is SOB just sitting and doing nothing and unable to exert herself much due to SOB.   baseline = used [employer] / sometimes used scooter sometimes not  / now sob at rest  Has flutter not using/ very congested cough but no purulent mucus  Sleeps on 2 pillows ok Very poor insight into meds/ flutter/ purse lip  rec Pantoprazole (protonix) 40 mg   Take  30-60 min before first meal of the day and Pepcid (famotidine)  20 mg one @  bedtime until return to office - this is the best way to tell whether stomach acid is contributing to your problem.   GERD  Diet   Stop advair and spiriva and just take anoro 2 good drags each am / one click  For breathing >  Nebulizer with albuterol every 4 hours if needed (if you can't catch your breath)  For cough > flutter valve as much as you can   02/12/2017  f/u ov/Wanda Prince re:   GOLD IV/ still smoking/ using saba sev times a day  Chief Complaint  Patient presents with  . Follow-up    SOB with activity, weight loss greater than 15 pounds in 6 months   doe still = MMRC3 =  can't walk 100 yards even at a slow pace at a flat grade s stopping due to sob  s 02/ HT but not wm leaning  Sleeping ok 2 pillows o/w flat  s am congestion   appetite good / eats well but wt loss/ no fever or sweats  rec Work on inhaler technique:  relax and gently blow all the way out then take a nice smooth deep breath back in, triggering the inhaler at same time you start breathing in.  Hold for up to 5 seconds if you can. Blow out thru nose. Rinse and gargle with water when done Plan A = Automatic = Anoro each am  Plan B = Backup Only use your albuterol(Proair=RED   Yellow= Proventil)  as a rescue medication to be used if you can't catch your breath by resting or doing a relaxed purse lip breathing pattern.  - The less you use it, the better it will work when you need it. - Ok to use the inhaler up to 2 puffs  every 4 hours if you must but call for appointment if use goes up over your usual need - Don't leave home without it !!  (think of it like the spare tire for your car)  Plan C = Crisis - only use your albuterol nebulizer if you  first try Plan B   Keep working on cutting down on smoking > The key is to stop smoking completely before smoking completely stops you!        08/18/2017  f/u ov/Wanda Prince re:  Copd  GOLD IV/ still smoking / no 02 Chief Complaint  Patient presents with  . Follow-up    Breathing is about the same. She uses her albuterol inhaler 3-4 x per day on average. She uses her neb 2 x per wk on average.    Dyspnea:  Still = MMRC3 = can't walk 100 yards even at a slow pace at a flat grade s stopping due to sob  - does use a buggy at [place] most of the time due to lack of scooter  Cough: not much production/ some am rattling  Sleep: ok on 2pillows SABA use:  As aobve/ using lots of saba despite bevespi though hfa suboptimal (see a/p)   No obvious day to day or daytime variability or assoc excess/ purulent sputum or mucus plugs or hemoptysis or cp or chest tightness,  subjective wheeze or overt sinus or hb symptoms. No unusual exposure hx or h/o childhood pna/ asthma or knowledge of premature birth.  Sleeping  2 pillows  without nocturnal  or early am exacerbation  of respiratory  c/o's or need for noct saba. Also denies any obvious fluctuation of symptoms with weather or environmental changes or other aggravating or alleviating factors except as outlined above   Current Allergies, Complete Past Medical History, Past Surgical History, Family History, and Social History were reviewed in [employer] record.  ROS  The following are not active complaints unless bolded Hoarseness, sore throat, dysphagia, dental problems, itching, sneezing,  nasal congestion or discharge of excess mucus or purulent secretions, ear ache,   fever, chills, sweats, unintended wt loss or wt gain, classically pleuritic or exertional cp,  orthopnea pnd or arm/hand swelling  or leg swelling, presyncope, palpitations, abdominal pain, anorexia, nausea, vomiting, diarrhea  or change in bowel habits or change in bladder habits, change in stools or change in urine, dysuria, hematuria,  rash, arthralgias, visual complaints, headache, numbness, weakness or ataxia or problems with walking or coordination,  change in mood or  memory.        Current Meds  Medication Sig  . acetaminophen (TYLENOL) 500 MG tablet Take 1,000 mg by mouth every 6 (six) hours as needed for pain.  Marland Kitchen albuterol (PROVENTIL HFA;VENTOLIN HFA) 108 (90 BASE) MCG/ACT inhaler Inhale 2 puffs into the lungs every 4 (four) hours as needed for wheezing or shortness of breath.   Marland Kitchen albuterol (PROVENTIL) (2.5 MG/3ML) 0.083% nebulizer solution USE 1 VIAL IN NEBULIZER EVERY 4 HOURS AS NEEDED FOR WHEEZING OR SHORTNESS OF BREATH  . b complex vitamins tablet Take 1 tablet by mouth 2 (two) times daily. (Patient taking differently: Take 1 tablet by mouth daily. )  . Cholecalciferol (CVS D3) 2000 units CAPS Take 2,000 Units by  mouth daily.  . cyclobenzaprine (FLEXERIL) 5 MG tablet Take 5 mg by mouth every 8 (eight) hours as needed for muscle spasms.   . folic acid (FOLVITE) 1 MG tablet Take 1 mg by mouth daily.  Marland Kitchen gabapentin (NEURONTIN) 300 MG capsule Take 600 mg by mouth at bedtime.   . Glycopyrrolate-Formoterol (BEVESPI AEROSPHERE) 9-4.8 MCG/ACT AERO Inhale 2 puffs into the lungs 2 (two) times daily.  Marland Kitchen levETIRAcetam (KEPPRA) 500 MG tablet Take 1 tablet (500 mg total) by mouth 2 (two) times daily.  Marland Kitchen  metoprolol succinate (TOPROL XL) 25 MG 24 hr tablet Take 25 mg by mouth daily.   . Multiple Vitamin (MULITIVITAMIN WITH MINERALS) TABS Take 1 tablet by mouth daily.  . pantoprazole (PROTONIX) 40 MG tablet Take 40 mg by mouth daily.  Marland Kitchen spironolactone (ALDACTONE) 25 MG tablet Take 25 mg by mouth daily.  Marland Kitchen thiamine 100 MG tablet Take 1 tablet (100 mg total) by mouth daily.  .                   Objective:  Physical Exam:  W/c to get from lobby to exam room   08/18/2017       96  05/16/2017       100  02/12/2017      97  03/18/2016      106  07/24/2015        109   06/01/2015         112   05/07/15 105 lb 4.8 oz (47.764 kg)  01/12/14 115 lb 15.4 oz (52.6 kg)  09/29/12 135 lb (61.236 kg)     Vital signs reviewed - Note on arrival 02 sats  90% on RA       HEENT: nl dentition / oropharynx. Nl external ear canals without cough reflex - moderate bilateral non-specific turbinate edema     NECK :  without JVD/Nodes/TM/ nl carotid upstrokes bilaterally   LUNGS: no acc muscle use,  Mod barrel  contour chest wall with bilateral  Distant bs s audible wheeze and  without cough on insp or exp maneuver and mod   Hyperresonant  to  percussion bilaterally     CV:  RRR  no s3 or murmur or increase in P2, and no edema   ABD:  soft and nontender with pos mid insp Hoover's  in the supine position. No bruits or organomegaly appreciated, bowel sounds nl  MS:   Nl gait/  ext warm without deformities, calf tenderness,  cyanosis or clubbing No obvious joint restrictions   SKIN: warm and dry without lesions    NEURO:  alert, approp, nl sensorium with  no motor or cerebellar deficits apparent.           CXR PA and Lateral:   08/18/2017 :    I personally reviewed images and agree with radiology impression as follows:   Severe copd/ no change LUL nodule     Assessment:

## 2017-08-18 NOTE — Patient Instructions (Addendum)
The key is to stop smoking completely before smoking completely stops you!    Work on inhaler technique:  relax and gently blow all the way out then take a nice smooth deep breath back in, triggering the inhaler at same time you start breathing in.  Hold for up to 5 seconds if you can. Blow out thru nose. Rinse and gargle with water when done      Please remember to go to the  x-ray department downstairs in the basement  for your tests - we will call you with the results when they are available.     Please schedule a follow up visit in 3 months but call sooner if needed

## 2017-08-19 ENCOUNTER — Encounter: Payer: Self-pay | Admitting: Internal Medicine

## 2017-08-19 NOTE — Progress Notes (Signed)
Spoke with Wanda Prince and notified of results/recs per MW

## 2017-08-19 NOTE — Assessment & Plan Note (Signed)
See ct 05/05/15  - PET 06/22/2015 1. 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 2. Mildly hypermetabolic nodule of the LEFT lung base which is decreased slightly in size in short interval favors infectious or inflammatory process. 3. No mediastinal lymphadenopathy metastatic mild hypermetabolic mediastinal adenopathy. - Quant Gold 07/24/2015 > POS > referred to HD > afb neg smear/cultures collected 07/29/15 x 3 faxed to pulmo  09/22/15  >  09/26/2015  referred to Dr Lamonte Sakai ? Navigational bx with afb stain/culture before considering RT?  - CT 11/03/15 > no change in RUL lesion > F/u Dr Lisbeth Renshaw > never RT'd - cxr 08/18/2017 = no change  Discussed in detail all the  indications, usual  risks and alternatives  relative to the benefits with patient who agrees to proceed with conservative f/u only as she has much more pressing issues that will limit her health/outcome here.

## 2017-08-19 NOTE — Assessment & Plan Note (Signed)

## 2017-08-19 NOTE — Assessment & Plan Note (Signed)
-   trial of Anoro  05/18/15 > better doe  06/01/2015  - Spirometry 06/01/2015  FEV1 0.54 (19%)  Ratio 39   - PFT's  07/24/2015  FEV1 0.65 (21 % ) ratio 29  p no % improvement from saba p anoro am prior to study with DLCO  12 % corrects to 23 % for alv volume   - 06/17/17 changed to bevespi due insurance   - 08/18/2017  After extensive coaching inhaler device  effectiveness =  75% (short Ti )    Pt continues to be Group B in terms of symptom/risk and laba/lama therefore appropriate rx at this point.  Really nothing to offer at this point though could consider change to lama/laba neb if deteriorating any further as short Ti/ low IC may limit ability to use inhalers   I had an extended discussion with the patient reviewing all relevant studies completed to date and  lasting 15 to 20 minutes of a 25 minute visit    Each maintenance medication was reviewed in detail including most importantly the difference between maintenance and prns and under what circumstances the prns are to be triggered using an action plan format that is not reflected in the computer generated alphabetically organized AVS.    Please see AVS for specific instructions unique to this visit that I personally wrote and verbalized to the the pt in detail and then reviewed with pt  by my nurse highlighting any  changes in therapy recommended at today's visit to their plan of care.      Marland Kitchen

## 2017-08-29 ENCOUNTER — Other Ambulatory Visit: Payer: Self-pay | Admitting: Internal Medicine

## 2017-11-08 IMAGING — DX DG CHEST 2V
2 series · 3 of 3 positions shown · non-contrast
Comparison: CT chest 06/25/2016, chest two-view 05/11/2016

CLINICAL DATA: Dyspnea on exertion.  Weight loss

EXAM:
CHEST  2 VIEW

[Series 1: chest pa · 0.14mm/px · 2 of 2 slices shown]
[im 1/2]
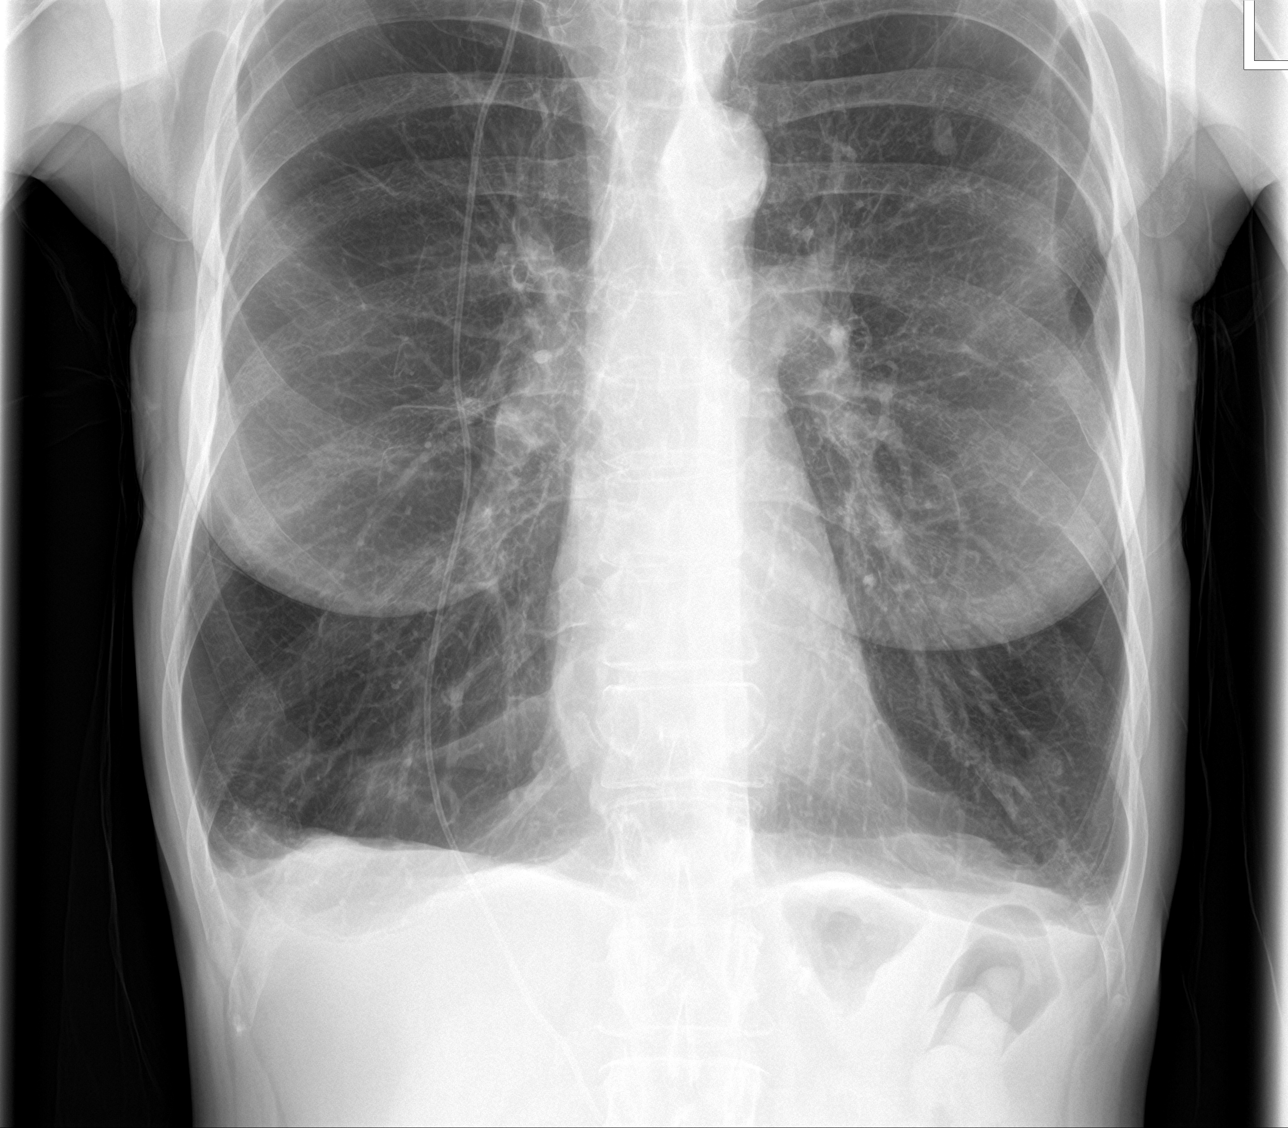
[im 2/2]
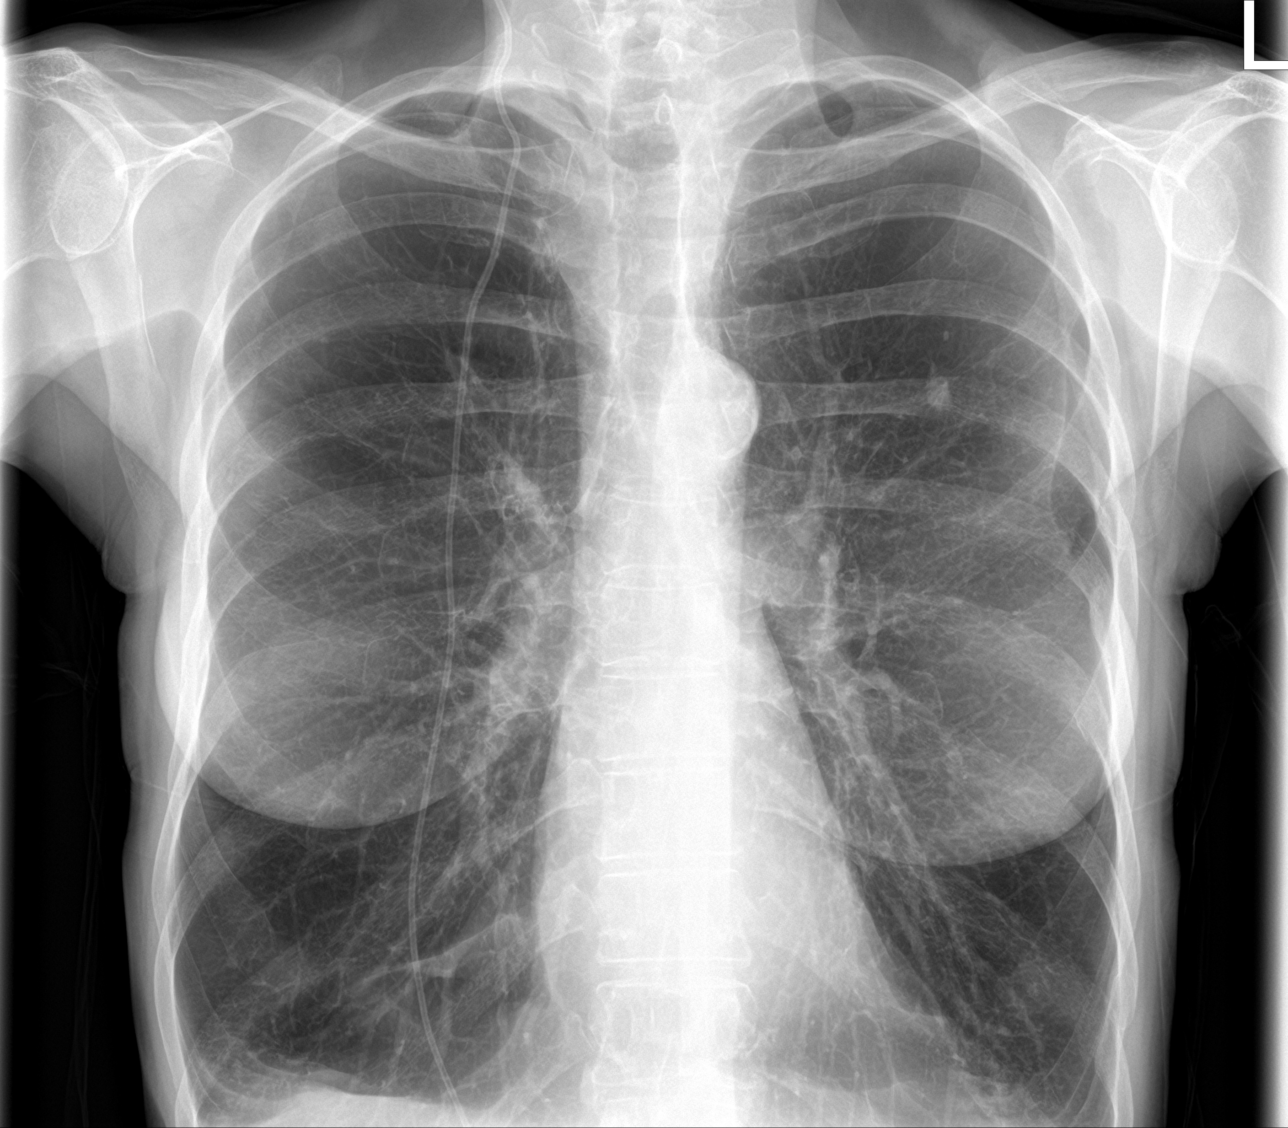

[chest lat]
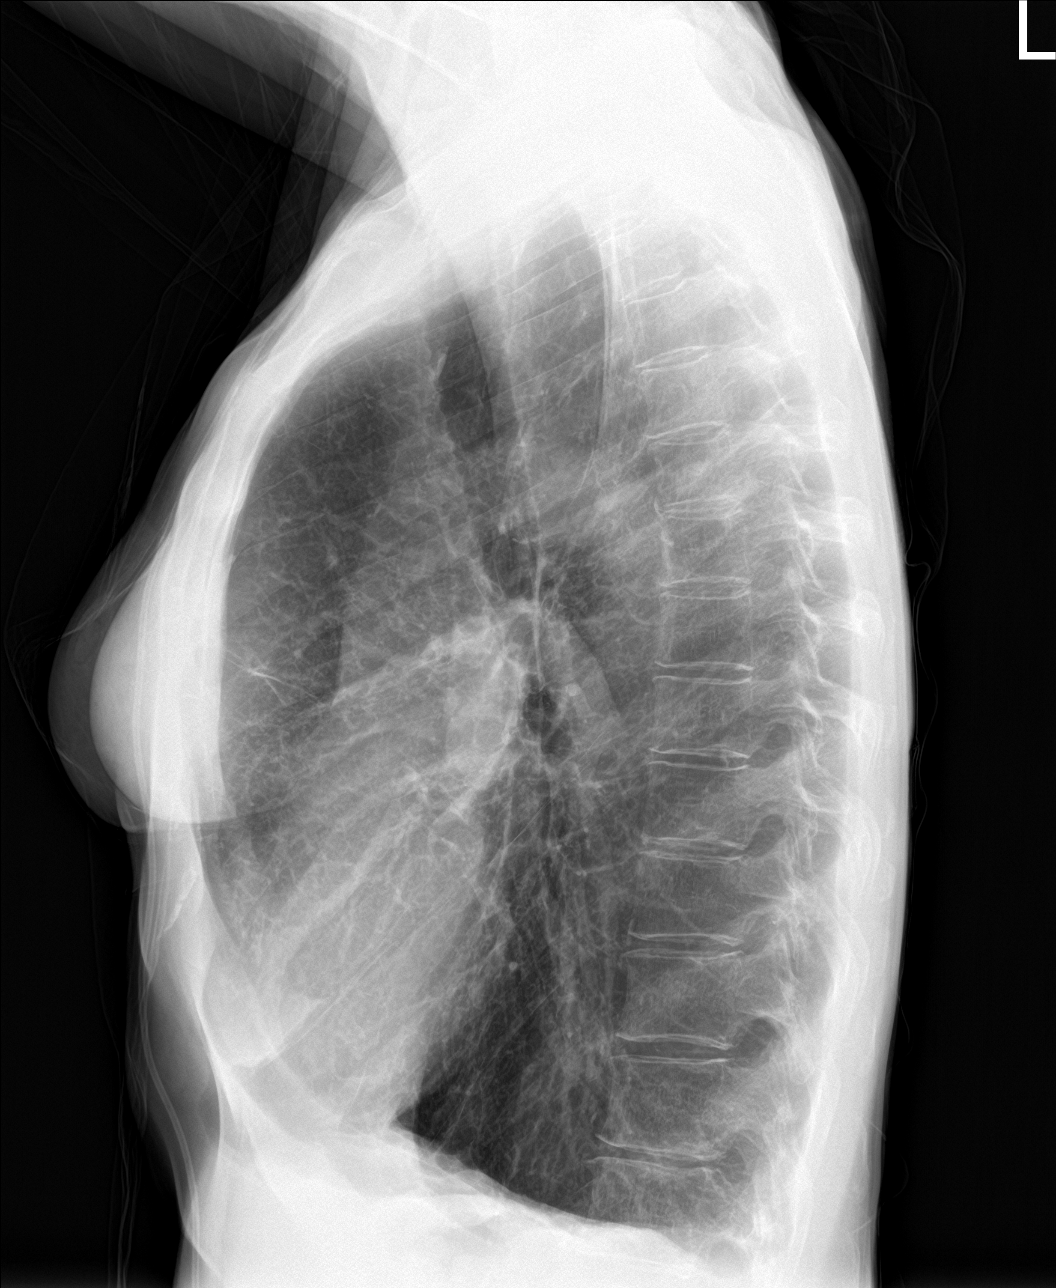

[3 of 3 positions shown; findings below may reference images not displayed]

FINDINGS: Advanced COPD with hyperinflation. Heart size and vascularity
normal. Atherosclerotic aorta. Negative for pneumonia or effusion

1 cm left upper lobe nodule stable. Cavitary nodule right upper lobe
not well seen on the chest x-ray but identified on CT.

Shunt tubing overlying the right chest.
IMPRESSION: Advanced COPD. Negative for pneumonia. Left upper lobe nodule stable

## 2017-11-17 ENCOUNTER — Ambulatory Visit (INDEPENDENT_AMBULATORY_CARE_PROVIDER_SITE_OTHER): Payer: Medicaid Other | Admitting: Internal Medicine

## 2017-11-17 ENCOUNTER — Encounter: Payer: Self-pay | Admitting: Internal Medicine

## 2017-11-17 VITALS — BP 114/70 | HR 86 | Ht 68.0 in | Wt 92.0 lb

## 2017-11-17 DIAGNOSIS — J449 Chronic obstructive pulmonary disease, unspecified: Secondary | ICD-10-CM

## 2017-11-17 DIAGNOSIS — F17210 Nicotine dependence, cigarettes, uncomplicated: Secondary | ICD-10-CM | POA: Diagnosis not present

## 2017-11-17 DIAGNOSIS — J984 Other disorders of lung: Secondary | ICD-10-CM

## 2017-11-17 NOTE — Progress Notes (Signed)
Subjective:  Patient ID: Wanda Prince, female   DOB: [birth year]    MRN: [id number]    Brief patient profile:  66 yowf active smoker  with deteriorating sob x weeks > admit   Date of Admission: 04/22/2015   Date of Discharge: 05/07/2015 Attending Physician: Aldine Contes, MD  Discharge Diagnosis: Active Problems:  COPD with acute exacerbation ([place])  COPD exacerbation ([place])  Acute respiratory failure with hypoxemia ([place])  Encounter for imaging study to confirm orogastric (OG) tube placement  Encounter for nasogastric (NG) tube placement  Acute respiratory failure ([place])  Lung mass  Liver mass   05/18/2015 1st [facility] office visit/ Eilan Mcinerny  maint rx spiriva/advair but very poor technique  Chief Complaint  Patient presents with  . Pulmonary Consult    Referred by Dr. Suzanna Obey. Pt states that she was dxed with COPD years ago. She c/o increased SOB for the past month. She is SOB just sitting and doing nothing and unable to exert herself much due to SOB.   baseline = used [employer] [facility] / sometimes used scooter sometimes not  / now sob at rest  Has flutter not using/ very congested cough but no purulent mucus  Sleeps on 2 pillows ok Very poor insight into meds/ flutter/ purse lip  rec Pantoprazole (protonix) 40 mg   Take  30-60 min before first meal of the day and Pepcid (famotidine)  20 mg one @  bedtime until return to office - this is the best way to tell whether stomach acid is contributing to your problem.   GERD  Diet   Stop advair and spiriva and just take anoro 2 good drags each am / one click  For breathing >  Nebulizer with albuterol every 4 hours if needed (if you can't catch your breath)  For cough > flutter valve as much as you can   02/12/2017  f/u ov/Cornesha Radziewicz re:   GOLD IV/ still smoking/ using saba sev times a day  Chief Complaint  Patient presents with  . Follow-up    SOB with activity, weight loss greater than 15 pounds in 6 months   doe still = MMRC3 =  can't walk 100 yards even at a slow pace at a flat grade s stopping due to sob  s 02/ HT but not wm leaning  Sleeping ok 2 pillows o/w flat  s am congestion   appetite good / eats well but wt loss/ no fever or sweats  rec Work on inhaler technique:  relax and gently blow all the way out then take a nice smooth deep breath back in, triggering the inhaler at same time you start breathing in.  Hold for up to 5 seconds if you can. Blow out thru nose. Rinse and gargle with water when done Plan A = Automatic = Anoro each am  Plan B = Backup Only use your albuterol(Proair=RED   Yellow= Proventil)  as a rescue medication to be used if you can't catch your breath by resting or doing a relaxed purse lip breathing pattern.  - The less you use it, the better it will work when you need it. - Ok to use the inhaler up to 2 puffs  every 4 hours if you must but call for appointment if use goes up over your usual need - Don't leave home without it !!  (think of it like the spare tire for your car)  Plan C = Crisis - only use your albuterol nebulizer if you  first try Plan B   Keep working on cutting down on smoking > The key is to stop smoking completely before smoking completely stops you!        08/18/2017  f/u ov/Prisca Gearing re:  Copd  GOLD IV/ still smoking / no 02 Chief Complaint  Patient presents with  . Follow-up    Breathing is about the same. She uses her albuterol inhaler 3-4 x per day on average. She uses her neb 2 x per wk on average.    Dyspnea:  Still = MMRC3 = can't walk 100 yards even at a slow pace at a flat grade s stopping due to sob  - does use a buggy at [place] most of the time due to lack of scooter  Cough: not much production/ some am rattling  Sleep: ok on 2pillows SABA use:  As aobve/ using lots of saba despite bevespi though hfa suboptimal (see a/p) rec The key is to stop smoking completely before smoking completely stops you!  Work on inhaler technique:       11/17/2017  f/u  ov/Bridney Guadarrama re:  Copd GOLD IV/ still smoking / maint on bevespi  Chief Complaint  Patient presents with  . Follow-up    Breathing is "okay"- some worse when she goes out in the hot weather. She is using her albuterol inhaler 3 x daily on average and rarely uses neb.    Dyspnea:  MMRC3 = can't walk 100 yards even at a slow pace at a flat grade s stopping due to sob   Cough: none  SABA use: last 4 h prior to OV   02: none    No obvious day to day or daytime variability or assoc excess/ purulent sputum or mucus plugs or hemoptysis or cp or chest tightness, subjective wheeze or overt sinus or hb symptoms.   Sleeping: 2 pillows/ flat  without nocturnal  or early am exacerbation  of respiratory  c/o's or need for noct saba. Also denies any obvious fluctuation of symptoms with weather or environmental changes or other aggravating or alleviating factors except as outlined above   No unusual exposure hx or h/o childhood pna/ asthma or knowledge of premature birth.  Current Allergies, Complete Past Medical History, Past Surgical History, Family History, and Social History were reviewed in [employer] record.  ROS  The following are not active complaints unless bolded Hoarseness, sore throat, dysphagia, dental problems, itching, sneezing,  nasal congestion or discharge of excess mucus or purulent secretions, ear ache,   fever, chills, sweats, unintended wt loss or wt gain, classically pleuritic or exertional cp,  orthopnea pnd or arm/hand swelling  or leg swelling, presyncope, palpitations, abdominal pain, anorexia, nausea, vomiting, diarrhea  or change in bowel habits or change in bladder habits, change in stools or change in urine, dysuria, hematuria,  rash, arthralgias, visual complaints, headache, numbness, weakness or ataxia or problems with walking or coordination,  change in mood or  memory.        Current Meds  Medication Sig  . acetaminophen (TYLENOL) 500 MG tablet Take  1,000 mg by mouth every 6 (six) hours as needed for pain.  Marland Kitchen albuterol (PROVENTIL HFA;VENTOLIN HFA) 108 (90 BASE) MCG/ACT inhaler Inhale 2 puffs into the lungs every 4 (four) hours as needed for wheezing or shortness of breath.   Marland Kitchen albuterol (PROVENTIL) (2.5 MG/3ML) 0.083% nebulizer solution USE 1 VIAL IN NEBULIZER EVERY 4 HOURS AS NEEDED FOR WHEEZING OR SHORTNESS OF BREATH  .  b complex vitamins tablet Take 1 tablet by mouth 2 (two) times daily. (Patient taking differently: Take 1 tablet by mouth daily. )  . BEVESPI AEROSPHERE 9-4.8 MCG/ACT AERO INHALE 2 PUFFS INTO THE LUNGS 2 TIMES A DAY  . Cholecalciferol (CVS D3) 2000 units CAPS Take 2,000 Units by mouth daily.  . cyclobenzaprine (FLEXERIL) 5 MG tablet Take 5 mg by mouth every 8 (eight) hours as needed for muscle spasms.   . folic acid (FOLVITE) 1 MG tablet Take 1 mg by mouth daily.  Marland Kitchen gabapentin (NEURONTIN) 300 MG capsule Take 600 mg by mouth at bedtime.   . levETIRAcetam (KEPPRA) 500 MG tablet Take 1 tablet (500 mg total) by mouth 2 (two) times daily.  . metoprolol succinate (TOPROL XL) 25 MG 24 hr tablet Take 25 mg by mouth daily.   . Multiple Vitamin (MULITIVITAMIN WITH MINERALS) TABS Take 1 tablet by mouth daily.  . pantoprazole (PROTONIX) 40 MG tablet Take 40 mg by mouth daily.  Marland Kitchen spironolactone (ALDACTONE) 25 MG tablet Take 25 mg by mouth daily.  Marland Kitchen thiamine 100 MG tablet Take 1 tablet (100 mg total) by mouth daily.                   Objective:  Physical Exam:  W/c bound elderly wf nad    11/17/2017      92  08/18/2017       96  05/16/2017       100  02/12/2017      97  03/18/2016      106  07/24/2015        109   06/01/2015         112   05/07/15 105 lb 4.8 oz (47.764 kg)  01/12/14 115 lb 15.4 oz (52.6 kg)  09/29/12 135 lb (61.236 kg)       Vital signs reviewed - Note on arrival 02 sats  92% on RA      HEENT: nl  oropharynx. Nl external ear canals without cough reflex - mild bilateral non-specific turbinate edema and  top denture plate     NECK :  without JVD/Nodes/TM/ nl carotid upstrokes bilaterally   LUNGS: no acc muscle use,  Mod barrel  contour chest wall with bilateral  Distant bs s audible wheeze and  without cough on insp or exp maneuver and mod   Hyperresonant  to  percussion bilaterally     CV:  RRR  no s3 or murmur or increase in P2, and no edema   ABD:  soft and nontender with pos mid insp Hoover's  in the supine position. No bruits or organomegaly appreciated, bowel sounds nl  MS:   Nl gait/  ext warm without deformities, calf tenderness, cyanosis or clubbing No obvious joint restrictions   SKIN: warm and dry without lesions    NEURO:  alert, approp, nl sensorium with  no motor or cerebellar deficits apparent.                   Assessment:

## 2017-11-17 NOTE — Patient Instructions (Signed)
The key is to stop smoking completely before smoking completely stops you!    Please schedule a follow up visit in 3 months but call sooner if needed with cxr on return

## 2017-11-18 ENCOUNTER — Encounter: Payer: Self-pay | Admitting: Internal Medicine

## 2017-11-18 NOTE — Assessment & Plan Note (Signed)
-   trial of Anoro  05/18/15 > better doe  06/01/2015  - Spirometry 06/01/2015  FEV1 0.54 (19%)  Ratio 39   - PFT's  07/24/2015  FEV1 0.65 (21 % ) ratio 29  p no % improvement from saba p anoro am prior to study with DLCO  12 % corrects to 23 % for alv volume   - 06/17/17 changed to bevespi due insurance    - 11/17/2017  After extensive coaching inhaler device  effectiveness =    75% (short Ti)  Pt is Group B in terms of symptom/risk and laba/lama therefore appropriate rx at this point > continue bevespi, keep working on better hfa and consider change to lama/laba neb if worsening    I had an extended discussion with the patient reviewing all relevant studies completed to date and  lasting 15 to 20 minutes of a 25 minute visit    See device teaching which extended face to face time for this visit.  Each maintenance medication was reviewed in detail including emphasizing most importantly the difference between maintenance and prns and under what circumstances the prns are to be triggered using an action plan format that is not reflected in the computer generated alphabetically organized AVS which I have not found useful in most complex patients, especially with respiratory illnesses  Please see AVS for specific instructions unique to this visit that I personally wrote and verbalized to the the pt in detail and then reviewed with pt  by my nurse highlighting any  changes in therapy recommended at today's visit to their plan of care.

## 2017-11-18 NOTE — Assessment & Plan Note (Signed)
See ct 05/05/15  - PET 06/22/2015 1. 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 2. Mildly hypermetabolic nodule of the LEFT lung base which is decreased slightly in size in short interval favors infectious or inflammatory process. 3. No mediastinal lymphadenopathy metastatic mild hypermetabolic mediastinal adenopathy. - Quant Gold 07/24/2015 > POS > referred to HD > afb neg smear/cultures collected 07/29/15 x 3 faxed to pulmo  09/22/15  >  09/26/2015  referred to Dr Lamonte Sakai ? Navigational bx with afb stain/culture before considering RT?  - CT 11/03/15 > no change in RUL lesion > F/u Dr Lisbeth Renshaw > never RT'd - cxr 08/18/2017 = no change    Discussed in detail all the  indications, usual  risks and alternatives  relative to the benefits with patient who agrees to proceed with conservative f/u with cxr on return

## 2017-11-18 NOTE — Assessment & Plan Note (Signed)
4-5 min discussion re active cigarette smoking in addition to office E&M  Ask about tobacco use:   ongoing Advise quitting   Matter of life or breath Assess willingness:  Not committed at this point Assist in quit attempt:  Per PCP when ready Arrange follow up:   Follow up per Primary Care planned

## 2018-02-19 ENCOUNTER — Ambulatory Visit: Payer: Medicaid Other | Admitting: Internal Medicine

## 2018-02-23 ENCOUNTER — Telehealth: Payer: Self-pay | Admitting: Internal Medicine

## 2018-02-23 ENCOUNTER — Ambulatory Visit (INDEPENDENT_AMBULATORY_CARE_PROVIDER_SITE_OTHER)
Admission: RE | Admit: 2018-02-23 | Discharge: 2018-02-23 | Disposition: A | Discharge status: Home / Self Care | Payer: Medicaid Other | Source: Ambulatory Visit | Attending: Internal Medicine | Admitting: Internal Medicine

## 2018-02-23 ENCOUNTER — Encounter: Payer: Self-pay | Admitting: Internal Medicine

## 2018-02-23 ENCOUNTER — Ambulatory Visit (INDEPENDENT_AMBULATORY_CARE_PROVIDER_SITE_OTHER): Payer: Medicaid Other | Admitting: Internal Medicine

## 2018-02-23 VITALS — BP 100/64 | HR 115 | Ht 68.0 in | Wt 87.0 lb

## 2018-02-23 DIAGNOSIS — F17210 Nicotine dependence, cigarettes, uncomplicated: Secondary | ICD-10-CM

## 2018-02-23 DIAGNOSIS — J449 Chronic obstructive pulmonary disease, unspecified: Secondary | ICD-10-CM

## 2018-02-23 DIAGNOSIS — J984 Other disorders of lung: Secondary | ICD-10-CM | POA: Diagnosis not present

## 2018-02-23 DIAGNOSIS — J9612 Chronic respiratory failure with hypercapnia: Secondary | ICD-10-CM

## 2018-02-23 MED ORDER — PREDNISONE 10 MG PO TABS: ORAL_TABLET | ORAL | 0 refills | Status: DC

## 2018-02-23 NOTE — Patient Instructions (Addendum)
Prednisone 10 mg x 2 each am with breakfast until better, then try one a day until return  The key is to stop smoking completely before smoking completely stops you!    Please remember to go to the  x-ray department downstairs in the basement  for your tests - we will call you with the results when they are available.       Please schedule a follow up office visit in 6 weeks, call sooner if needed

## 2018-02-23 NOTE — Progress Notes (Addendum)
Subjective:  Patient ID: Wanda Prince, female   DOB: 11-06-1955    MRN: [id number]    Brief patient profile:  66 yowf active smoker  with deteriorating sob x weeks > admit   Date of Admission: 04/22/2015   Date of Discharge: 05/07/2015 Attending Physician: Aldine Contes, MD  Discharge Diagnosis: Active Problems:  COPD with acute exacerbation ([place])  COPD exacerbation ([place])  Acute respiratory failure with hypoxemia ([place])  Encounter for imaging study to confirm orogastric (OG) tube placement  Encounter for nasogastric (NG) tube placement  Acute respiratory failure ([place])  Lung mass  Liver mass   05/18/2015 1st [facility] office visit/ Wert  maint rx spiriva/advair but very poor technique  Chief Complaint  Patient presents with  . Pulmonary Consult    Referred by Dr. Suzanna Obey. Pt states that she was dxed with COPD years ago. She c/o increased SOB for the past month. She is SOB just sitting and doing nothing and unable to exert herself much due to SOB.   baseline = used [employer] parking / sometimes used scooter sometimes not  / now sob at rest  Has flutter not using/ very congested cough but no purulent mucus  Sleeps on 2 pillows ok Very poor insight into meds/ flutter/ purse lip  rec Pantoprazole (protonix) 40 mg   Take  30-60 min before first meal of the day and Pepcid (famotidine)  20 mg one @  bedtime until return to office - this is the best way to tell whether stomach acid is contributing to your problem.   GERD  Diet   Stop advair and spiriva and just take anoro 2 good drags each am / one click  For breathing >  Nebulizer with albuterol every 4 hours if needed (if you can't catch your breath)  For cough > flutter valve as much as you can   02/12/2017  f/u ov/Wert re:   GOLD IV/ still smoking/ using saba sev times a day  Chief Complaint  Patient presents with  . Follow-up    SOB with activity, weight loss greater than 15 pounds in 6 months   doe still = MMRC3 =  can't walk 100 yards even at a slow pace at a flat grade s stopping due to sob  s 02/ HT but not wm leaning  Sleeping ok 2 pillows o/w flat  s am congestion   appetite good / eats well but wt loss/ no fever or sweats  rec Work on inhaler technique:  relax and gently blow all the way out then take a nice smooth deep breath back in, triggering the inhaler at same time you start breathing in.  Hold for up to 5 seconds if you can. Blow out thru nose. Rinse and gargle with water when done Plan A = Automatic = Anoro each am  Plan B = Backup Only use your albuterol(Proair=RED   Yellow= Proventil)  as a rescue medication to be used if you can't catch your breath by resting or doing a relaxed purse lip breathing pattern.  - The less you use it, the better it will work when you need it. - Ok to use the inhaler up to 2 puffs  every 4 hours if you must but call for appointment if use goes up over your usual need - Don't leave home without it !!  (think of it like the spare tire for your car)  Plan C = Crisis - only use your albuterol nebulizer if you  first try Plan B   Keep working on cutting down on smoking > The key is to stop smoking completely before smoking completely stops you!               11/17/2017  f/u ov/Wert re:  Copd GOLD IV/ still smoking / maint on bevespi  Chief Complaint  Patient presents with  . Follow-up    Breathing is "okay"- some worse when she goes out in the hot weather. She is using her albuterol inhaler 3 x daily on average and rarely uses neb.    Dyspnea:  MMRC3 = can't walk 100 yards even at a slow pace at a flat grade s stopping due to sob   Cough: none SABA use: last 4 h prior to OV   02: none  rec Stop smoking/ no change rx    02/23/2018  f/u ov/Wert re: gold iv/ still smoking/ maint rx = bevespi Chief Complaint  Patient presents with  . Follow-up    Breathing has been worse over the past few months. She is using her albuterol inhaler 4 x daily on average  and neb about 2 x per wk.    Dyspnea:  50 ft = MMRC4  = sob if tries to leave home or while getting dressed   Cough: min Sleeping: on side horizontal SABA use: as above 02: none   No obvious day to day or daytime variability or assoc excess/ purulent sputum or mucus plugs or hemoptysis or cp or chest tightness, subjective wheeze or overt sinus or hb symptoms.   Sleeping as above  without nocturnal  or early am exacerbation  of respiratory  c/o's or need for noct saba. Also denies any obvious fluctuation of symptoms with weather or environmental changes or other aggravating or alleviating factors except as outlined above   No unusual exposure hx or h/o childhood pna/ asthma or knowledge of premature birth.  Current Allergies, Complete Past Medical History, Past Surgical History, Family History, and Social History were reviewed in [employer] record.  ROS  The following are not active complaints unless bolded Hoarseness, sore throat, dysphagia, dental problems, itching, sneezing,  nasal congestion or discharge of excess mucus or purulent secretions, ear ache,   fever, chills, sweats, unintended wt loss or wt gain, classically pleuritic or exertional cp,  orthopnea pnd or arm/hand swelling  or leg swelling, presyncope, palpitations, abdominal pain, anorexia, nausea, vomiting, diarrhea  or change in bowel habits or change in bladder habits, change in stools or change in urine, dysuria, hematuria,  rash, arthralgias, visual complaints, headache, numbness, weakness or ataxia or problems with walking or coordination,  change in mood or  memory.        Current Meds  Medication Sig  . acetaminophen (TYLENOL) 500 MG tablet Take 1,000 mg by mouth every 6 (six) hours as needed for pain.  Marland Kitchen albuterol (PROVENTIL HFA;VENTOLIN HFA) 108 (90 BASE) MCG/ACT inhaler Inhale 2 puffs into the lungs every 4 (four) hours as needed for wheezing or shortness of breath.   Marland Kitchen b complex vitamins  tablet Take 1 tablet by mouth 2 (two) times daily. (Patient taking differently: Take 1 tablet by mouth daily. )  . BEVESPI AEROSPHERE 9-4.8 MCG/ACT AERO INHALE 2 PUFFS INTO THE LUNGS 2 TIMES A DAY  . Cholecalciferol (CVS D3) 2000 units CAPS Take 2,000 Units by mouth daily.  . cyclobenzaprine (FLEXERIL) 5 MG tablet Take 5 mg by mouth every 8 (eight) hours as needed for muscle  spasms.   . folic acid (FOLVITE) 1 MG tablet Take 1 mg by mouth daily.  Marland Kitchen gabapentin (NEURONTIN) 300 MG capsule Take 600 mg by mouth at bedtime.   . levETIRAcetam (KEPPRA) 500 MG tablet Take 1 tablet (500 mg total) by mouth 2 (two) times daily.  . metoprolol succinate (TOPROL XL) 25 MG 24 hr tablet Take 25 mg by mouth daily.   . Multiple Vitamin (MULITIVITAMIN WITH MINERALS) TABS Take 1 tablet by mouth daily.  . pantoprazole (PROTONIX) 40 MG tablet Take 40 mg by mouth daily.  Marland Kitchen spironolactone (ALDACTONE) 25 MG tablet Take 25 mg by mouth daily.  Marland Kitchen thiamine 100 MG tablet Take 1 tablet (100 mg total) by mouth daily.  . [  albuterol (PROVENTIL) (2.5 MG/3ML) 0.083% nebulizer solution USE 1 VIAL IN NEBULIZER EVERY 4 HOURS AS NEEDED FOR WHEEZING OR SHORTNESS OF BREATH                 Objective:  Physical Exam:  W/c bound wf nad   02/23/2018    87  11/17/2017      92  08/18/2017       96  05/16/2017       100  02/12/2017      97  03/18/2016      106  07/24/2015        109   06/01/2015         112   05/07/15 105 lb 4.8 oz (47.764 kg)  01/12/14 115 lb 15.4 oz (52.6 kg)  09/29/12 135 lb (61.236 kg)       Vital signs reviewed - Note on arrival 02 sats  94% on RA    02/23/2018 /5cmod   top denture plate     HEENT: Top denture plate / nl   oropharynx. Nl external ear canals without cough reflex -  Mild bilateral non-specific turbinate edema     NECK :  without JVD/Nodes/TM/ nl carotid upstrokes bilaterally   LUNGS: no acc muscle use,  Mod barrel  contour chest wall with bilateral  Distant bs s audible wheeze and   without cough on insp or exp maneuver and mod  Hyperresonant  to  percussion bilaterally     CV:  RRR  no s3 or murmur or increase in P2, and no edema   ABD:  soft and nontender with pos mid insp Hoover's  in the supine position. No bruits or organomegaly appreciated, bowel sounds nl  MS:   Nl gait/  ext warm without deformities, calf tenderness, cyanosis or clubbing No obvious joint restrictions   SKIN: warm and dry without lesions    NEURO:  alert, approp, nl sensorium with  no motor or cerebellar deficits apparent.              CXR PA and Lateral:   02/23/2018 :    I personally reviewed images and agree with radiology impression as follows:   1.  No active cardiopulmonary disease. 2. COPD.  Unchanged left upper lobe pulmonary nodule.       Assessment:

## 2018-02-23 NOTE — Telephone Encounter (Signed)
Prednisone 10 mg x 2 each am until better, then try one a day until return  Spoke with Taquita at [facility] and advised her of the above instructions. She verbalized understanding. Nothing further needed at time of call.

## 2018-02-24 ENCOUNTER — Other Ambulatory Visit: Payer: Self-pay | Admitting: Internal Medicine

## 2018-02-24 NOTE — Progress Notes (Signed)
Spoke with the pt's sister Vickii Chafe and notified of results/recs and she verbalized understanding.

## 2018-02-26 ENCOUNTER — Encounter: Payer: Self-pay | Admitting: Internal Medicine

## 2018-02-26 DIAGNOSIS — J9612 Chronic respiratory failure with hypercapnia: Secondary | ICD-10-CM | POA: Insufficient documentation

## 2018-02-26 IMAGING — MR MR ABDOMEN WO/W CM
8 of 17 series · 24 of 48 positions shown · IV contrast (eovist)
Comparison: MRI 05/10/2016

CLINICAL DATA: Followup liver ablation site. Hepatocellular
carcinoma.

EXAM:
MRI ABDOMEN WITHOUT AND WITH CONTRAST
TECHNIQUE: Multiplanar multisequence MR imaging of the abdomen was performed
both before and after the administration of intravenous contrast.
CONTRAST:  4mL EOVIST GADOXETATE DISODIUM 0.25 MOL/L IV SOLN

[Series 3: T2 · coronal · 5.0mm · 1.41mm/px · 1 of 30 slices shown]
[im 1/30]
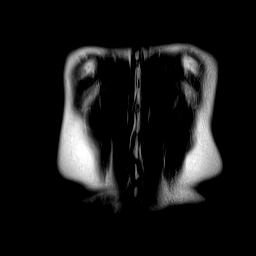

[Series 4: axial in out · axial · 6.0mm · 0.68mm/px · 1 of 60 slices shown]
[im 1/60]
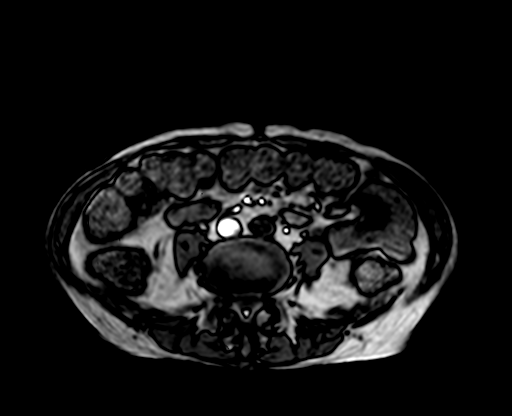

[Series 5: T1 dynamic · axial · non-contrast · 2.2mm · 0.72mm/px · z∈[-168,+24]mm · 3 of 88 slices shown]
[im 1/88]
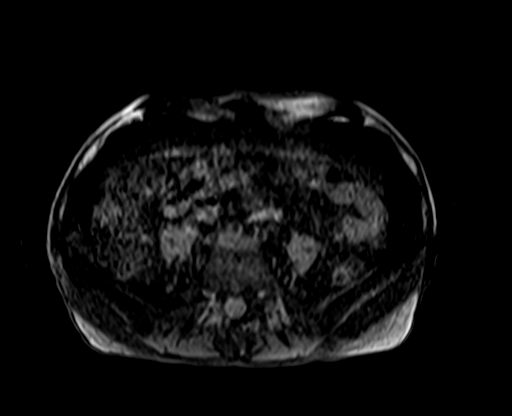
[im 44/88]
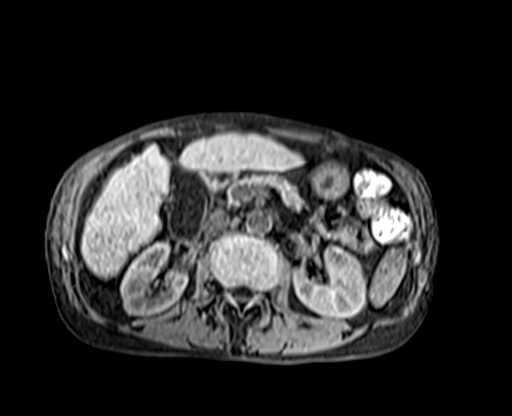
[im 88/88]
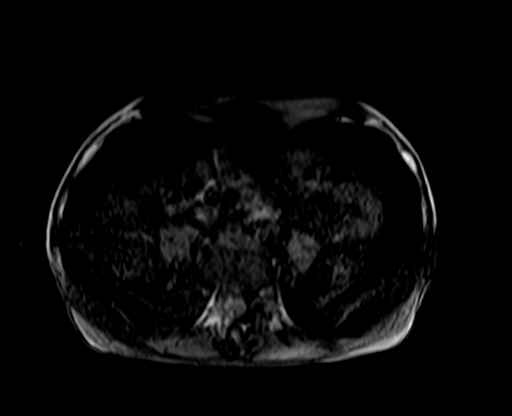

[Series 6: post immed · axial · 2.2mm · 0.72mm/px · z∈[-168,+24]mm · 3 of 88 slices shown]
[im 1/88]
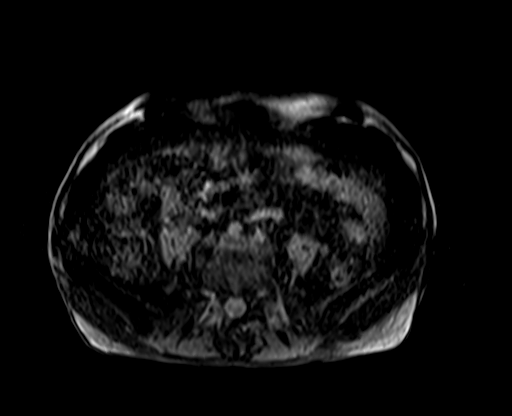
[im 44/88]
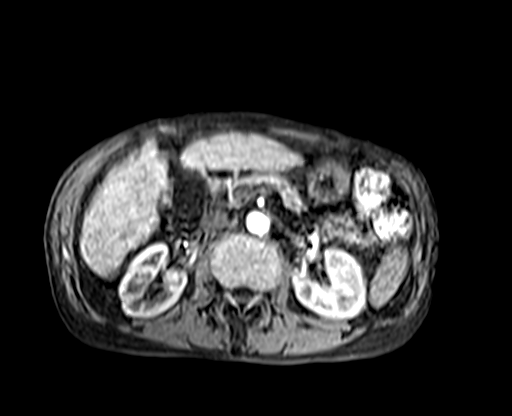
[im 88/88]
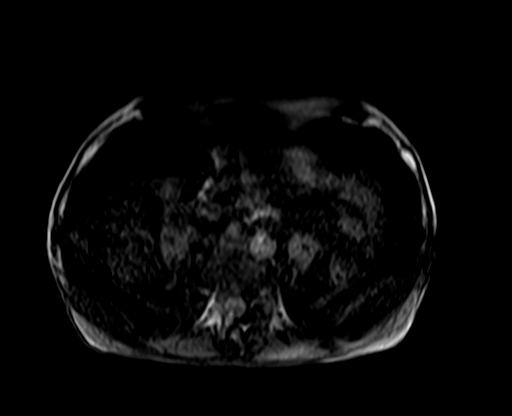

[Series 7: post immed_sub · axial · 2.2mm · 0.72mm/px · z∈[-168,+24]mm · 4 of 88 slices shown]
[im 1/88]
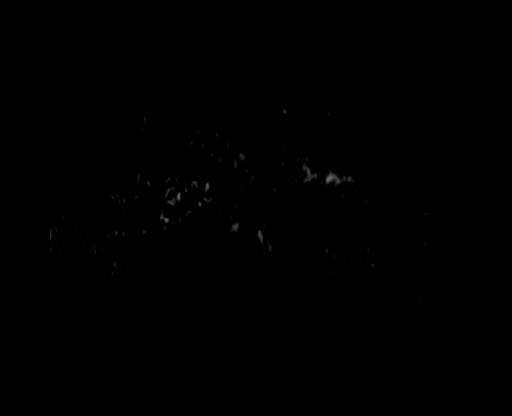
[im 30/88]
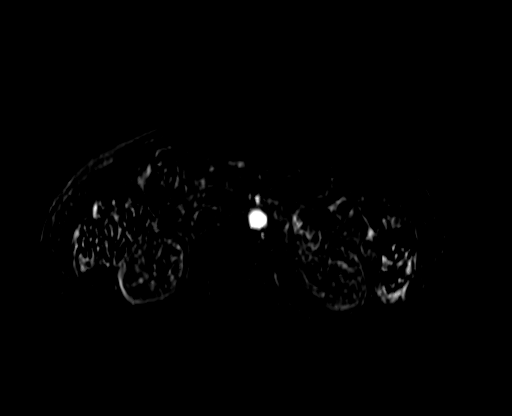
[im 59/88]
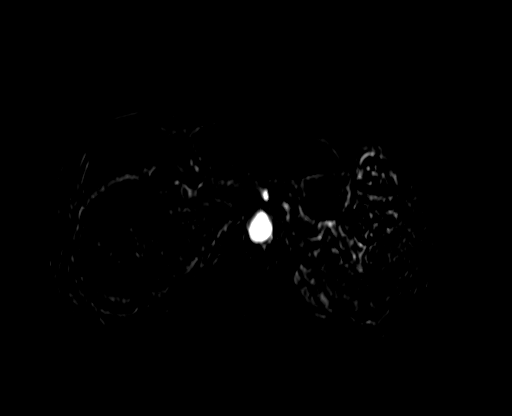
[im 88/88]
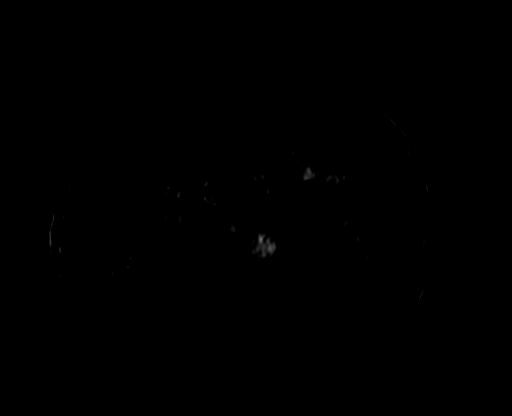

[Series 8: post 45 sec · axial · 2.2mm · 0.72mm/px · z∈[-168,+24]mm · 4 of 88 slices shown]
[im 1/88]
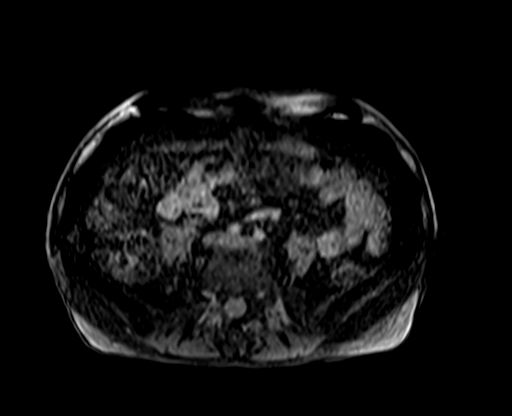
[im 30/88]
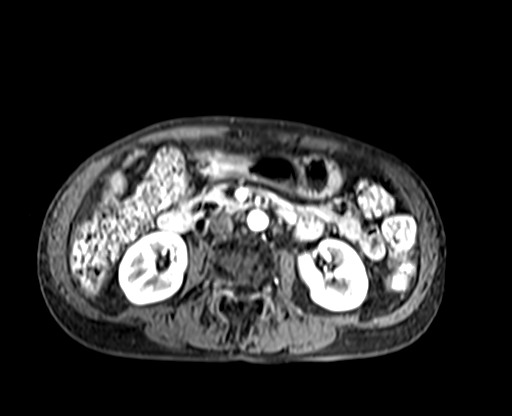
[im 59/88]
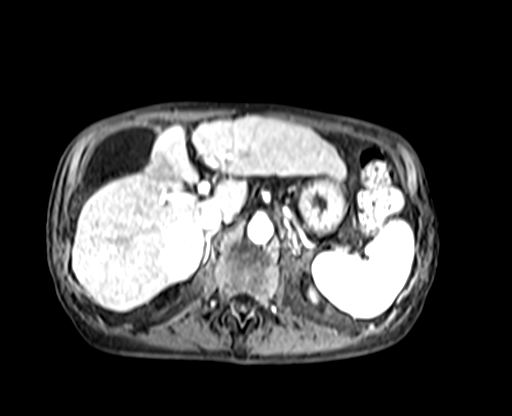
[im 88/88]
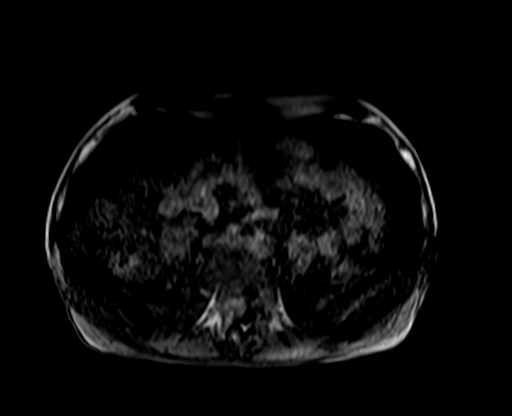

[Series 9: post 45 sec_sub · axial · 2.2mm · 0.72mm/px · z∈[-168,+24]mm · 4 of 88 slices shown]
[im 1/88]
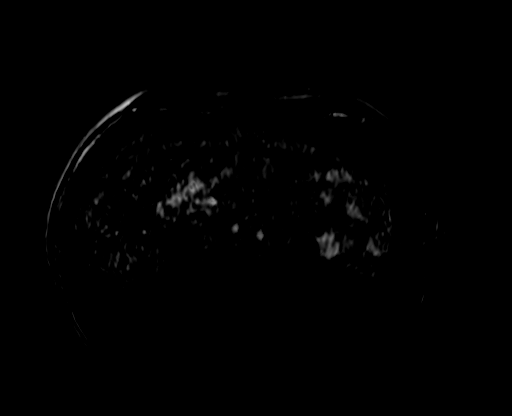
[im 30/88]
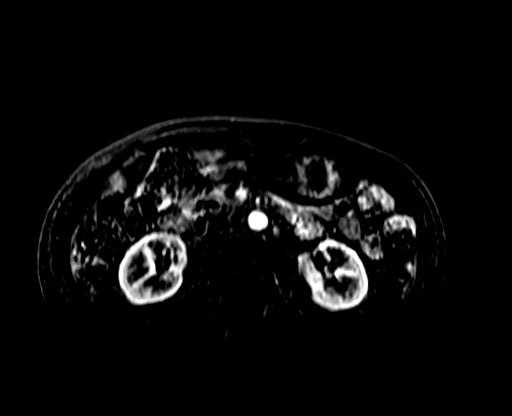
[im 59/88]
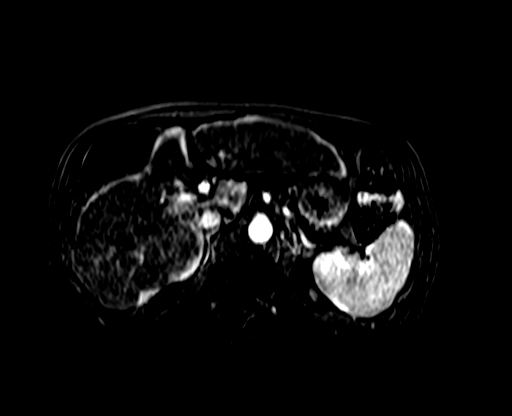
[im 88/88]
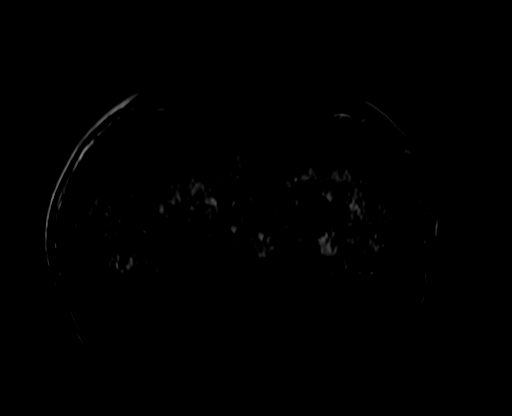

[Series 10: post 90 sec · axial · 2.2mm · 0.72mm/px · z∈[-168,+24]mm · 4 of 88 slices shown]
[im 1/88]
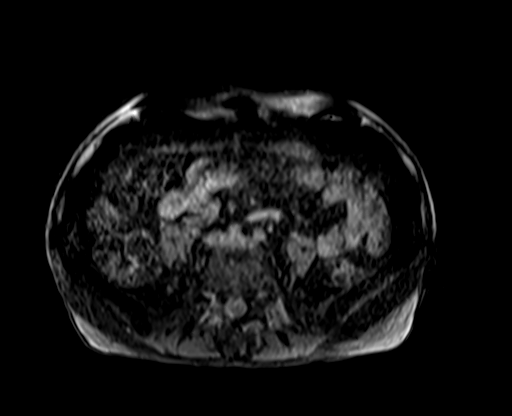
[im 30/88]
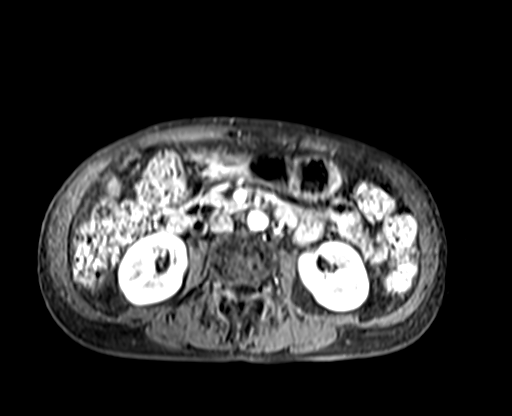
[im 59/88]
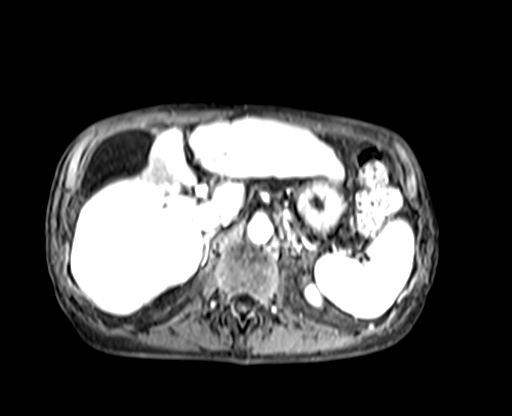
[im 88/88]
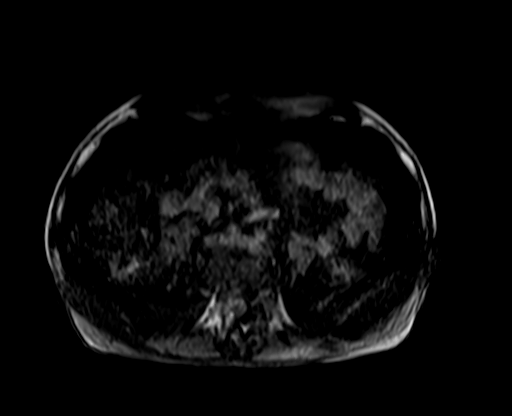

[24 of 48 positions shown; findings below may reference images not displayed]

FINDINGS: Lower chest:  Lung bases are clear.

Hepatobiliary: Liver has a nodular contour. Thermal ablation site in
the inferior RIGHT hepatic lobe (segment 5). This triangular
ablation site measures 2.5 by 2.1 cm which may be slightly
contracted from 2.8 x 2.2 cm on prior. There is a ovoid rim high
signal intensity within this lesion (image 34, series 10) which is
hyperintense on precontrast imaging (series 5) consistent with a rim
of blood product. There is no discrete enhancing lesions within the
ablation site.

Additionally no additional early enhancing lesions within the liver
parenchyma to localize hepatocellular carcinoma or dysplastic
nodules.

The gallbladder is elongated and extends beneath the RIGHT
hemidiaphragm. Stones or sludge within the neck of the gallbladder.
There is sludge within the distal common bile duct (image 20, series
3). Common bile duct is upper limits of normal at 6 mm. No
intrahepatic duct dilatation.

Pancreas: Normal pancreatic parenchymal intensity. No ductal
dilatation or inflammation.

Spleen: Normal spleen.

Adrenals/urinary tract: Adrenal glands and kidneys are normal.

Stomach/Bowel: Stomach and limited of the small bowel is
unremarkable

Vascular/Lymphatic: Abdominal aortic normal caliber. No
retroperitoneal periportal lymphadenopathy.

Musculoskeletal: No aggressive osseous lesion
IMPRESSION: 1. No evidence local recurrence at the thermal ablation site in the
inferior medial RIGHT hepatic lobe.
2. No enhancing lesion within liver to suggest hepatocellular
carcinoma elsewhere.
3. Morphologic changes in liver consistent cirrhosis.
4. Sludge within the common bile duct without evidence of
obstruction.

## 2018-02-26 NOTE — Assessment & Plan Note (Signed)
See ct 05/05/15  - PET 06/22/2015 1. 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 2. Mildly hypermetabolic nodule of the LEFT lung base which is decreased slightly in size in short interval favors infectious or inflammatory process. 3. No mediastinal lymphadenopathy metastatic mild hypermetabolic mediastinal adenopathy. - Quant Gold 07/24/2015 > POS > referred to HD > afb neg smear/cultures collected 07/29/15 x 3 faxed to pulmo  09/22/15  >  09/26/2015  referred to Dr Lamonte Sakai ? Navigational bx with afb stain/culture before considering RT?  - CT 11/03/15 > no change in RUL lesion > F/u Dr Lisbeth Renshaw > never RT'd - cxr 08/18/2017 = no change  - cxr 02/23/2018 no change on L , R not viz    Based on behavior to date this is not likely an aggressive lesion and no further directed f/u is needed based on the endstage nature of her pulmonary dz.  Discussed in detail all the  indications, usual  risks and alternatives  relative to the benefits with patient who agrees to proceed with conservative f/u as outlined

## 2018-02-26 NOTE — Assessment & Plan Note (Signed)
-   trial of Anoro  05/18/15 > better doe  06/01/2015  - Spirometry 06/01/2015  FEV1 0.54 (19%)  Ratio 39   - PFT's  07/24/2015  FEV1 0.65 (21 % ) ratio 29  p no % improvement from saba p anoro am prior to study with DLCO  12 % corrects to 23 % for alv volume   - 06/17/17 changed to bevespi due insurance  - 02/23/2018  After extensive coaching inhaler device,  effectiveness =    75% (short ti)  - prednisone 20 ub then 10 maint added to rx 02/23/2018   She appears to be approaching endstage dz with muscle wasting as well. >>>Will try pred maint rx and return in 6 weeks to regroup

## 2018-02-26 NOTE — Assessment & Plan Note (Signed)
HC03 06/18/17 = 35    Though somewhat paradoxic, when the lung fails to clear C02 properly and pC02 rises the lung then becomes a more efficient scavenger of C02 allowing lower work of breathing and  better C02 clearance albeit at a higher serum pC02 level - this is why pts can look a lot better than their ABG's would suggest and why it's so difficult to prognosticate endstage dz.  It's also why I strongly rec DNI status (ventilating pts down to a nl pC02 adversely affects this compensatory mechanism)     I had an extended discussion with the patient reviewing all relevant studies completed to date and  lasting 15 to 20 minutes of a 25 minute visit    See device teaching which extended face to face time for this visit.  Each maintenance medication was reviewed in detail including emphasizing most importantly the difference between maintenance and prns and under what circumstances the prns are to be triggered using an action plan format that is not reflected in the computer generated alphabetically organized AVS which I have not found useful in most complex patients, especially with respiratory illnesses  Please see AVS for specific instructions unique to this visit that I personally wrote and verbalized to the the pt in detail and then reviewed with pt  by my nurse highlighting any  changes in therapy recommended at today's visit to their plan of care.

## 2018-02-26 NOTE — Assessment & Plan Note (Signed)
4-5 min discussion re active cigarette smoking in addition to office E&M  Ask about tobacco use:   ongoing Advise quitting   Advised may see significant improvement with appetite and reverse wt loss immediately if quits  Assess willingness:  Not committed at this point Assist in quit attempt:  Per PCP when ready Arrange follow up:   Follow up per Primary Care planned

## 2018-04-07 ENCOUNTER — Ambulatory Visit: Payer: Medicaid Other | Admitting: Internal Medicine

## 2018-04-14 ENCOUNTER — Other Ambulatory Visit: Payer: Self-pay | Admitting: Family Medicine

## 2018-04-14 DIAGNOSIS — Z1231 Encounter for screening mammogram for malignant neoplasm of breast: Secondary | ICD-10-CM

## 2018-04-30 ENCOUNTER — Ambulatory Visit (INDEPENDENT_AMBULATORY_CARE_PROVIDER_SITE_OTHER): Payer: Medicaid Other | Admitting: Internal Medicine

## 2018-04-30 ENCOUNTER — Telehealth: Payer: Self-pay | Admitting: Internal Medicine

## 2018-04-30 ENCOUNTER — Encounter: Payer: Self-pay | Admitting: Internal Medicine

## 2018-04-30 VITALS — BP 110/68 | HR 102 | Ht 68.0 in | Wt 89.8 lb

## 2018-04-30 DIAGNOSIS — J984 Other disorders of lung: Secondary | ICD-10-CM

## 2018-04-30 DIAGNOSIS — F17210 Nicotine dependence, cigarettes, uncomplicated: Secondary | ICD-10-CM

## 2018-04-30 DIAGNOSIS — J449 Chronic obstructive pulmonary disease, unspecified: Secondary | ICD-10-CM

## 2018-04-30 MED ORDER — PREDNISONE 10 MG PO TABS: ORAL_TABLET | ORAL | 1 refills | Status: DC

## 2018-04-30 NOTE — Telephone Encounter (Signed)
Spoke with pt's sister, Vickii Chafe. She is needing the pt's prednisone prescription sent to a different pharmacy. Rx has been sent to the correct pharmacy. Nothing further was needed.

## 2018-04-30 NOTE — Progress Notes (Signed)
Subjective:  Patient ID: Wanda Prince, female   DOB: 1955-06-09    MRN: [id number]    Brief patient profile:  87 yowf active smoker  with deteriorating sob x weeks > admit   Date of Admission: 04/22/2015   Date of Discharge: 05/07/2015 Attending Physician: Aldine Contes, MD  Discharge Diagnosis: Active Problems:  COPD with acute exacerbation ([place])  COPD exacerbation ([place])  Acute respiratory failure with hypoxemia ([place])  Encounter for imaging study to confirm orogastric (OG) tube placement  Encounter for nasogastric (NG) tube placement  Acute respiratory failure ([place])  Lung mass  Liver mass   05/18/2015 1st [facility] office visit/   maint rx spiriva/advair but very poor technique  Chief Complaint  Patient presents with  . Pulmonary Consult    Referred by Dr. Suzanna Obey. Pt states that she was dxed with COPD years ago. She c/o increased SOB for the past month. She is SOB just sitting and doing nothing and unable to exert herself much due to SOB.   baseline = used [employer]c parking / sometimes used scooter sometimes not  / now sob at rest  Has flutter not using/ very congested cough but no purulent mucus  Sleeps on 2 pillows ok Very poor insight into meds/ flutter/ purse lip  rec Pantoprazole (protonix) 40 mg   Take  30-60 min before first meal of the day and Pepcid (famotidine)  20 mg one @  bedtime until return to office - this is the best way to tell whether stomach acid is contributing to your problem.   GERD  Diet   Stop advair and spiriva and just take anoro 2 good drags each am / one click  For breathing >  Nebulizer with albuterol every 4 hours if needed (if you can't catch your breath)  For cough > flutter valve as much as you can   02/12/2017  f/u ov/ re:   GOLD IV/ still smoking/ using saba sev times a day  Chief Complaint  Patient presents with  . Follow-up    SOB with activity, weight loss greater than 15 pounds in 6 months   doe still = MMRC3 =  can't walk 100 yards even at a slow pace at a flat grade s stopping due to sob  s 02/ HT but not wm leaning  Sleeping ok 2 pillows o/w flat  s am congestion   appetite good / eats well but wt loss/ no fever or sweats  rec Work on inhaler technique:  relax and gently blow all the way out then take a nice smooth deep breath back in, triggering the inhaler at same time you start breathing in.  Hold for up to 5 seconds if you can. Blow out thru nose. Rinse and gargle with water when done Plan A = Automatic = Anoro each am  Plan B = Backup Only use your albuterol(Proair=RED   Yellow= Proventil)  as a rescue medication to be used if you can't catch your breath by resting or doing a relaxed purse lip breathing pattern.  - The less you use it, the better it will work when you need it. - Ok to use the inhaler up to 2 puffs  every 4 hours if you must but call for appointment if use goes up over your usual need - Don't leave home without it !!  (think of it like the spare tire for your car)  Plan C = Crisis - only use your albuterol nebulizer if you  first try Plan B   Keep working on cutting down on smoking > The key is to stop smoking completely before smoking completely stops you!                    02/23/2018  f/u ov/ re: gold iv/ still smoking/ maint rx = bevespi Chief Complaint  Patient presents with  . Follow-up    Breathing has been worse over the past few months. She is using her albuterol inhaler 4 x daily on average and neb about 2 x per wk.    Dyspnea:  50 ft = MMRC4  = sob if tries to leave home or while getting dressed   Cough: min Sleeping: on side horizontal rec Prednisone 10 mg x 2 each am with breakfast until better, then try one a day until return The key is to stop smoking completely before smoking completely stops you!     04/30/2018  f/u ov/ re:  GOLD IV / still smoking  On prednisone 10 mg  Chief Complaint  Patient presents with  . Follow-up    Breathing  has been progressively worse. She plans to quit smoking today and is requesting patches.    Dyspnea:  MMRC4  = sob if tries to leave home or while getting dressed  Though some better on prednisone  Vs prior  Cough: no Sleeping: on flat bed/ 2 pillows  SABA use: 3 x daily if active/ not much is stays still  02: none    No obvious day to day or daytime variability or assoc excess/ purulent sputum or mucus plugs or hemoptysis or cp or chest tightness, subjective wheeze or overt sinus or hb symptoms.   Sleeping  without nocturnal  or early am exacerbation  of respiratory  c/o's or need for noct saba. Also denies any obvious fluctuation of symptoms with weather or environmental changes or other aggravating or alleviating factors except as outlined above   No unusual exposure hx or h/o childhood pna/ asthma or knowledge of premature birth.  Current Allergies, Complete Past Medical History, Past Surgical History, Family History, and Social History were reviewed in [employer] record.  ROS  The following are not active complaints unless bolded Hoarseness, sore throat, dysphagia, dental problems, itching, sneezing,  nasal congestion or discharge of excess mucus or purulent secretions, ear ache,   fever, chills, sweats, unintended wt loss or wt gain, classically pleuritic or exertional cp,  orthopnea pnd or arm/hand swelling  or leg swelling, presyncope, palpitations, abdominal pain, anorexia, nausea, vomiting, diarrhea  or change in bowel habits or change in bladder habits, change in stools or change in urine, dysuria, hematuria,  rash, arthralgias, visual complaints, headache, numbness, weakness or ataxia or problems with walking or coordination,  change in mood or  memory.        Current Meds  Medication Sig  . acetaminophen (TYLENOL) 500 MG tablet Take 1,000 mg by mouth every 6 (six) hours as needed for pain.  Marland Kitchen albuterol (PROVENTIL HFA;VENTOLIN HFA) 108 (90 BASE) MCG/ACT  inhaler Inhale 2 puffs into the lungs every 4 (four) hours as needed for wheezing or shortness of breath.   Marland Kitchen albuterol (PROVENTIL) (2.5 MG/3ML) 0.083% nebulizer solution USE 1 VIAL IN NEBULIZER EVERY 4 HOURS AS NEEDED FOR WHEEZING OR SHORTNESS OF BREATH  . b complex vitamins tablet Take 1 tablet by mouth 2 (two) times daily. (Patient taking differently: Take 1 tablet by mouth daily. )  . BEVESPI AEROSPHERE  9-4.8 MCG/ACT AERO INHALE 2 PUFFS INTO THE LUNGS 2 TIMES A DAY  . Cholecalciferol ([employer] D3) 2000 units CAPS Take 2,000 Units by mouth daily.  . cyclobenzaprine (FLEXERIL) 5 MG tablet Take 5 mg by mouth every 8 (eight) hours as needed for muscle spasms.   . folic acid (FOLVITE) 1 MG tablet Take 1 mg by mouth daily.  Marland Kitchen gabapentin (NEURONTIN) 300 MG capsule Take 600 mg by mouth at bedtime.   . levETIRAcetam (KEPPRA) 500 MG tablet Take 1 tablet (500 mg total) by mouth 2 (two) times daily.  . metoprolol succinate (TOPROL XL) 25 MG 24 hr tablet Take 25 mg by mouth daily.   . Multiple Vitamin (MULITIVITAMIN WITH MINERALS) TABS Take 1 tablet by mouth daily.  . predniSONE (DELTASONE) 10 MG tablet Take as directed  . spironolactone (ALDACTONE) 25 MG tablet Take 25 mg by mouth daily.  Marland Kitchen thiamine 100 MG tablet Take 1 tablet (100 mg total) by mouth daily.                 Objective:  Physical Exam:  W/c bound chronically ill somewhat cachectic appearing wf nad  04/30/2018        89  02/23/2018    87  11/17/2017      92  08/18/2017       96  05/16/2017       100  02/12/2017      97  03/18/2016      106  07/24/2015        109   06/01/2015         112   05/07/15 105 lb 4.8 oz (47.764 kg)  01/12/14 115 lb 15.4 oz (52.6 kg)  09/29/12 135 lb (61.236 kg)       Vital signs reviewed - Note on arrival 02 sats  91% on RA     HEENT: Top plate/ nl oropharynx. Nl external ear canals without cough reflex -  Mild bilateral non-specific turbinate edema     NECK :  without JVD/Nodes/TM/ nl carotid upstrokes  bilaterally   LUNGS: no acc muscle use,  Mod barrel  contour chest wall with bilateral  Distant bs s audible wheeze and  without cough on insp or exp maneuver and mod   Hyperresonant  to  percussion bilaterally     CV:  RRR  no s3 or murmur or increase in P2, and no edema   ABD:  soft and nontender with pos mid insp Hoover's  in the supine position. No bruits or organomegaly appreciated, bowel sounds nl  MS:     ext warm with gen muscle wasting, calf tenderness, cyanosis or clubbing No obvious joint restrictions   SKIN: warm and dry without lesions    NEURO:  alert, approp, nl sensorium with  no motor or cerebellar deficits apparent.         Assessment:

## 2018-04-30 NOTE — Patient Instructions (Addendum)
Prednisone 10 mg x 2 each am with breakfast until better, then try one a day until return - if worse, go back to 2 each am and start over   The key is to stop smoking completely before smoking completely stops you!   Please schedule a follow up visit in 3 months but call sooner if needed with cxr on return

## 2018-05-03 ENCOUNTER — Encounter: Payer: Self-pay | Admitting: Internal Medicine

## 2018-05-03 NOTE — Assessment & Plan Note (Signed)
Active smoker  - trial of Anoro  05/18/15 > better doe  06/01/2015  - Spirometry 06/01/2015  FEV1 0.54 (19%)  Ratio 39   - PFT's  07/24/2015  FEV1 0.65 (21 % ) ratio 29  p no % improvement from saba p anoro am prior to study with DLCO  12 % corrects to 23 % for alv volume   - 06/17/17 changed to bevespi due insurance    - prednisone 20 ub then 10 maint added to rx 02/23/2018  04/30/2018  After extensive coaching inhaler device,  effectiveness =  75% with Bevespi (short Ti )     The goal with a chronic steroid dependent illness is always arriving at the lowest effective dose that controls the disease/symptoms and not accepting a set "formula" which is based on statistics or guidelines that don't always take into account patient  variability or the natural hx of the dz in every individual patient, which may well vary over time.  For now therefore I recommend the patient maintain  20 mg ceiling but taper to 5 mg per day floor

## 2018-05-03 NOTE — Assessment & Plan Note (Signed)

## 2018-05-03 NOTE — Assessment & Plan Note (Signed)
See ct 05/05/15  - PET 06/22/2015 1. 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 2. Mildly hypermetabolic nodule of the LEFT lung base which is decreased slightly in size in short interval favors infectious or inflammatory process. 3. No mediastinal lymphadenopathy metastatic mild hypermetabolic mediastinal adenopathy. - Quant Gold 07/24/2015 > POS > referred to HD > afb neg smear/cultures collected 07/29/15 x 3 faxed to pulmo  09/22/15  >  09/26/2015  referred to Dr Lamonte Sakai ? Navigational bx with afb stain/culture before considering RT?  - CT 11/03/15 > no change in RUL lesion > F/u Dr Lisbeth Renshaw > never RT'd - cxr 08/18/2017 = no change  - cxr 02/23/2018 no change on L , R not viz    Declines further w/u for now > recheck cxr at next ov

## 2018-05-14 IMAGING — DX DG CHEST 2V
2 series · 3 of 3 positions shown · non-contrast
Comparison: 02/12/2017

CLINICAL DATA: Follow-up bilateral nodules

EXAM:
CHEST - 2 VIEW

[Series 1: chest pa · 0.14mm/px · 2 of 2 slices shown]
[im 1/2]
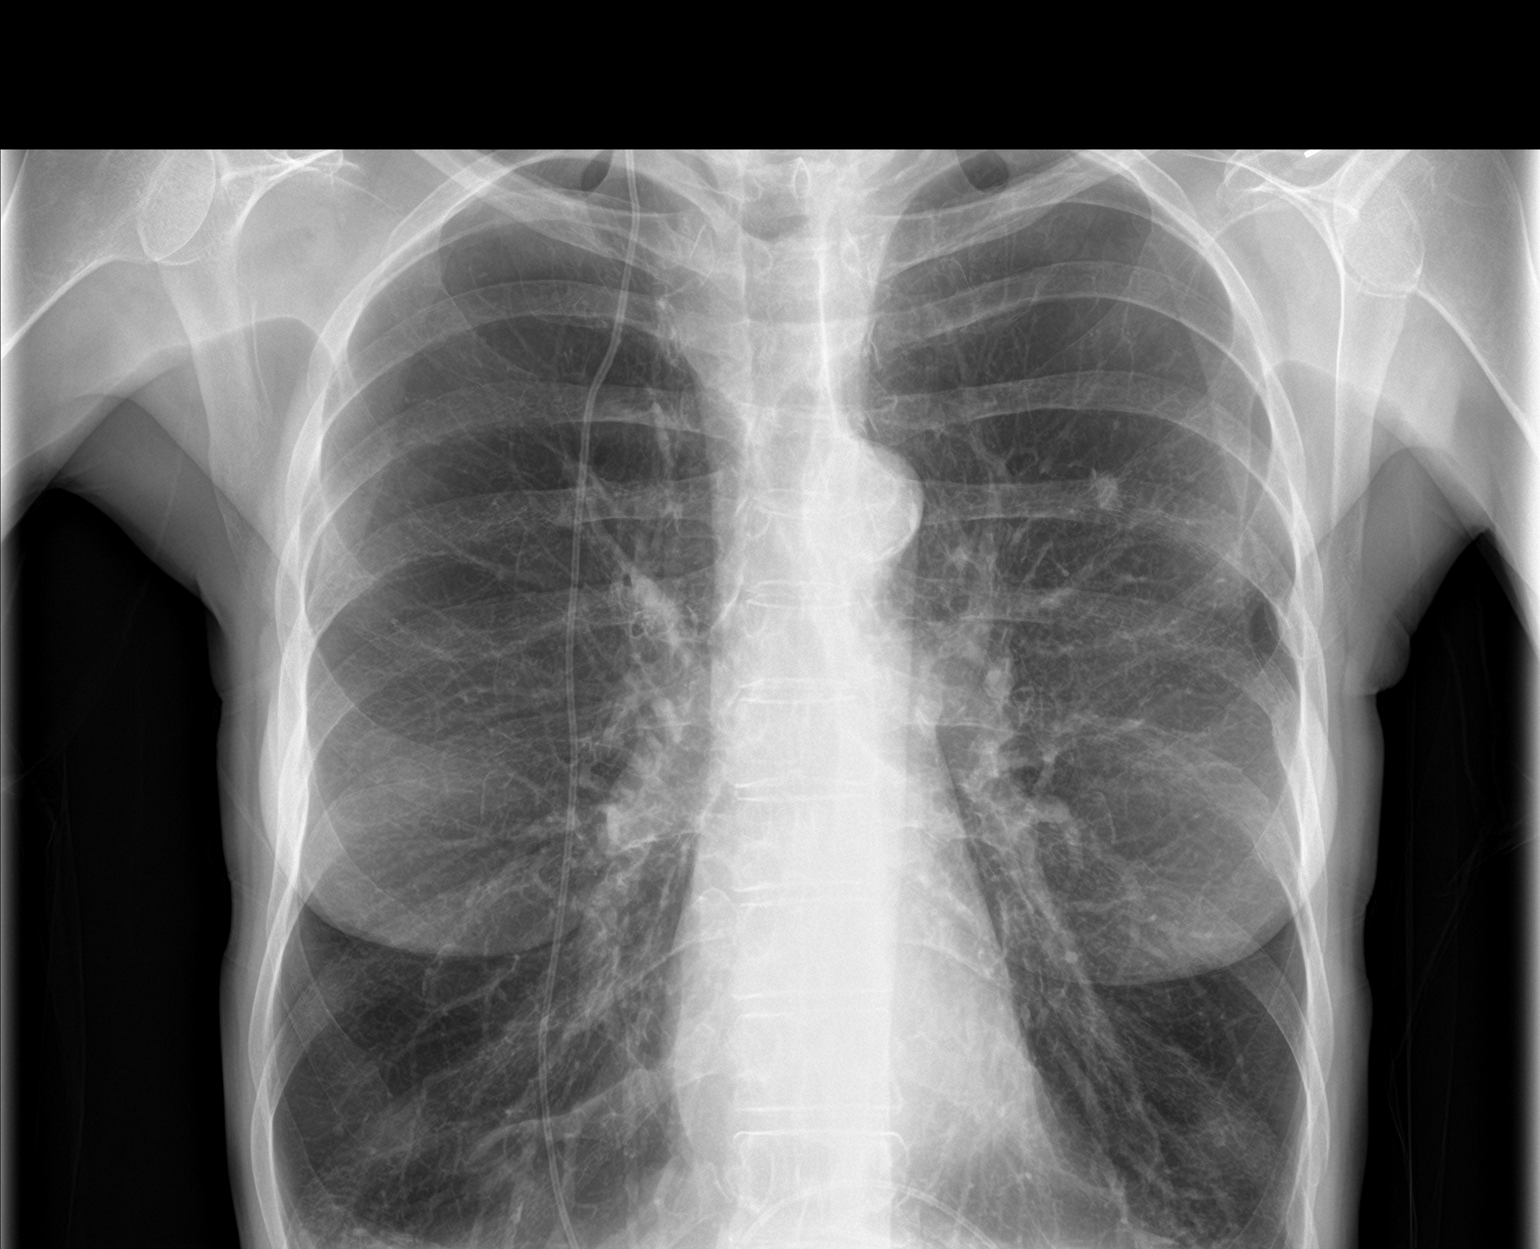
[im 2/2]
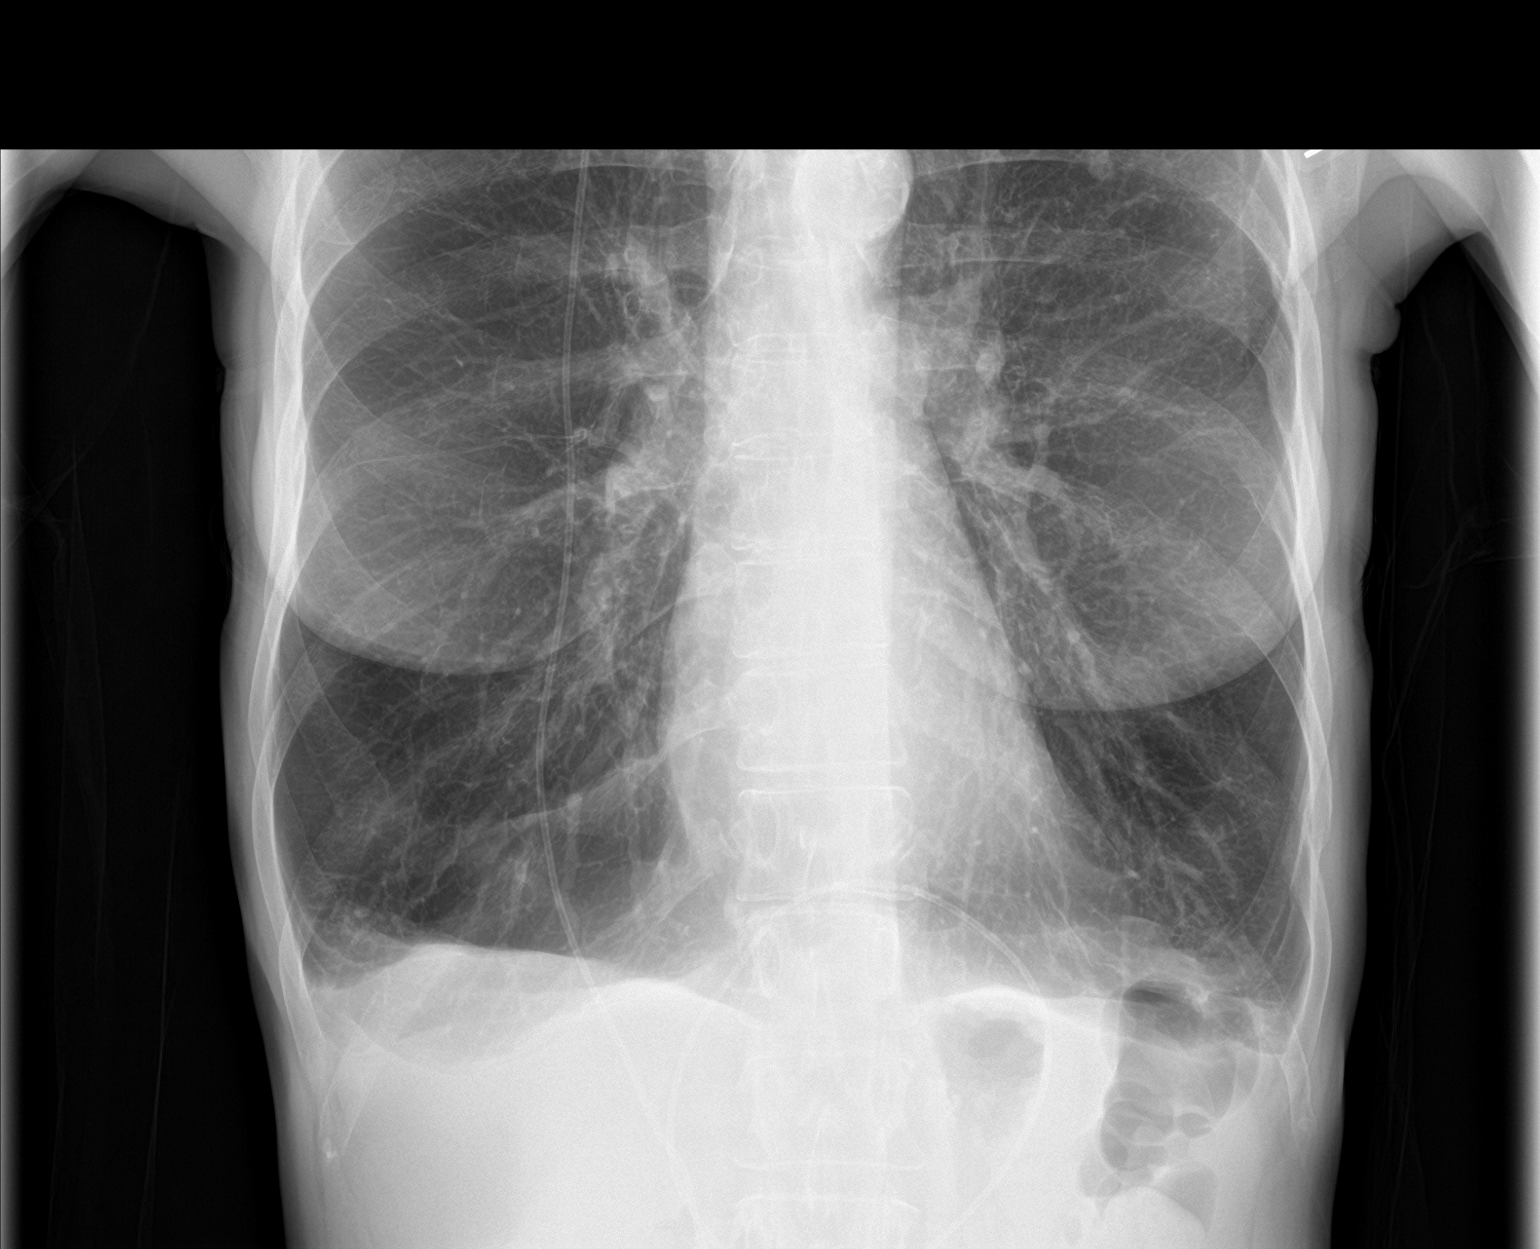

[chest lat]
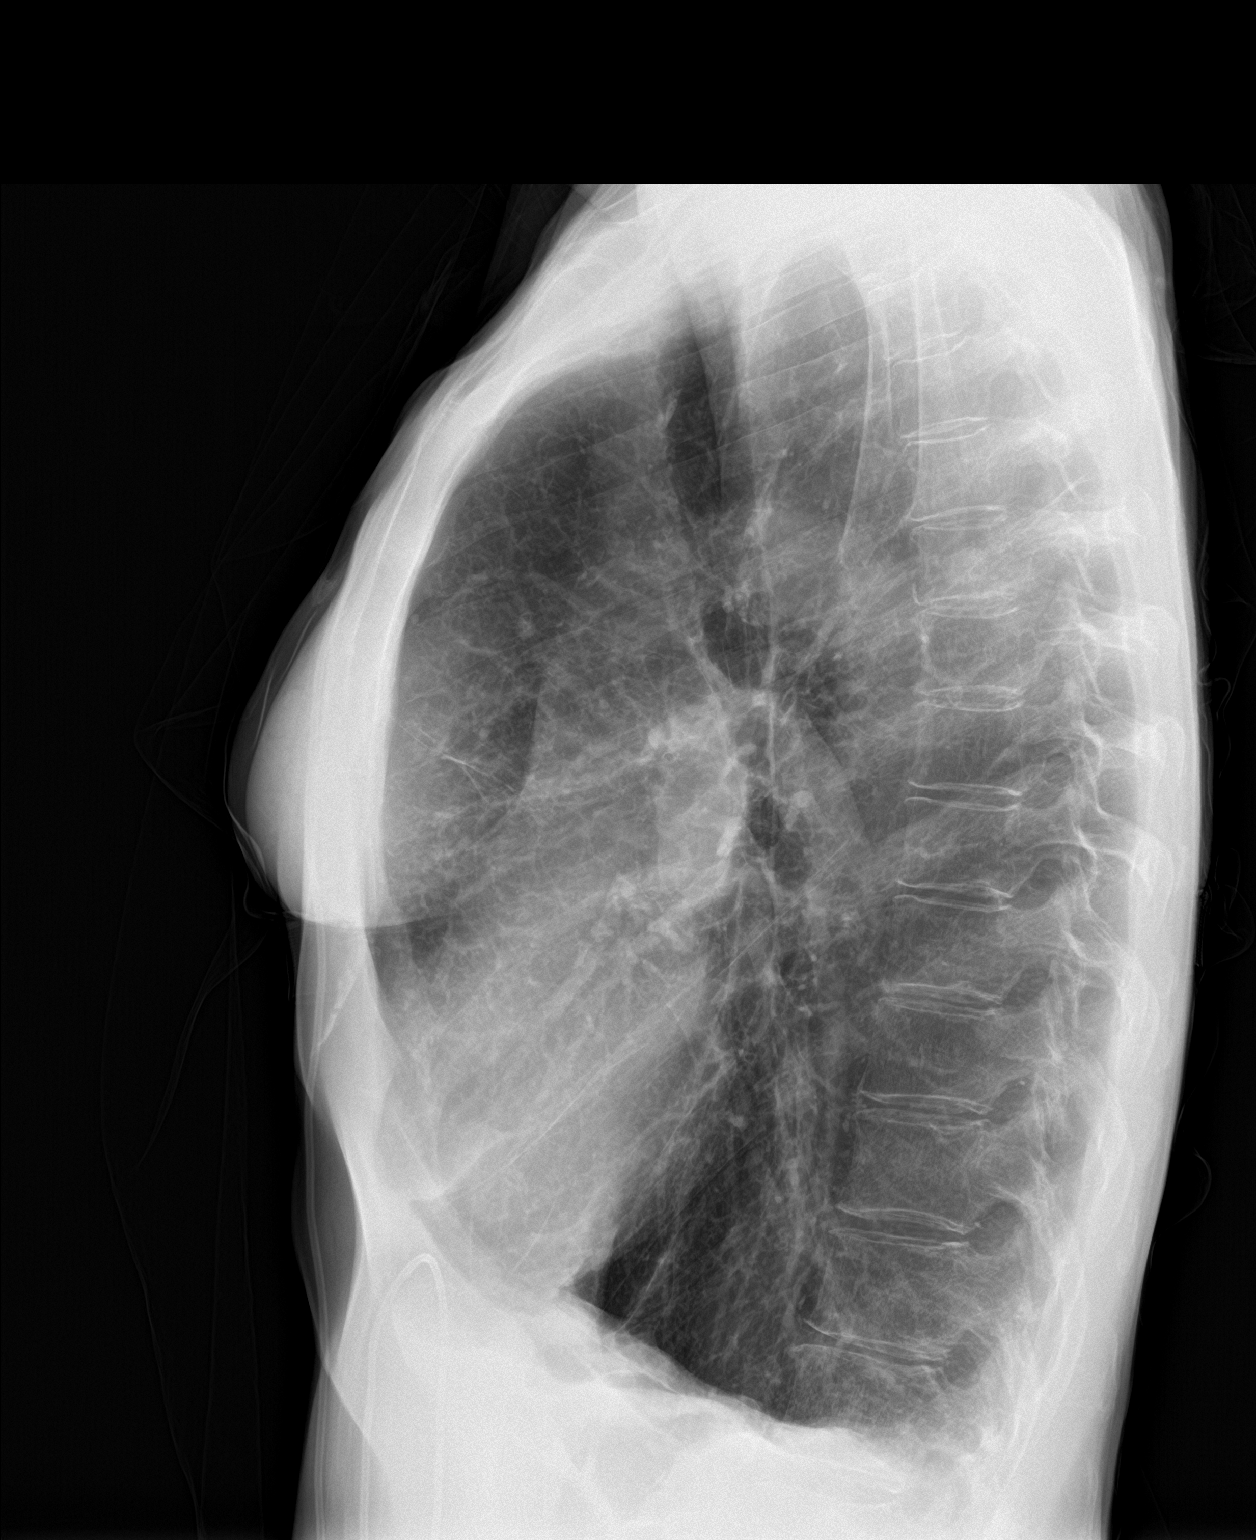

[3 of 3 positions shown; findings below may reference images not displayed]

FINDINGS: There is hyperinflation of the lungs compatible with advanced COPD.
Lung bases are not included on the PA view. Left upper lobe nodule
appears stable. Heart is normal size. No effusions.
Ventriculoperitoneal shunt catheter noted in the right anterior
chest wall. No acute bony abnormality.
IMPRESSION: Advanced COPD.

Stable left upper lobe pulmonary nodule.

Lung bases are not imaged on the frontal view.

## 2018-05-21 ENCOUNTER — Ambulatory Visit: Payer: Medicaid Other

## 2018-05-29 ENCOUNTER — Other Ambulatory Visit: Payer: Self-pay | Admitting: Internal Medicine

## 2018-06-29 ENCOUNTER — Ambulatory Visit
Admission: RE | Admit: 2018-06-29 | Discharge: 2018-06-29 | Disposition: A | Discharge status: Home / Self Care | Payer: Medicaid Other | Source: Ambulatory Visit | Attending: Family Medicine | Admitting: Family Medicine

## 2018-06-29 DIAGNOSIS — Z1231 Encounter for screening mammogram for malignant neoplasm of breast: Secondary | ICD-10-CM

## 2018-06-30 ENCOUNTER — Other Ambulatory Visit: Payer: Self-pay | Admitting: Internal Medicine

## 2018-07-27 ENCOUNTER — Observation Stay (HOSPITAL_COMMUNITY)
Admission: EM | Admit: 2018-07-27 | Discharge: 2018-07-29 | Disposition: A | Discharge status: Home / Self Care | Payer: Medicaid Other | Attending: Internal Medicine | Admitting: Internal Medicine

## 2018-07-27 ENCOUNTER — Emergency Department (HOSPITAL_COMMUNITY): Payer: Medicaid Other

## 2018-07-27 ENCOUNTER — Encounter (HOSPITAL_COMMUNITY): Payer: Self-pay | Admitting: Emergency Medicine

## 2018-07-27 DIAGNOSIS — F329 Major depressive disorder, single episode, unspecified: Secondary | ICD-10-CM | POA: Insufficient documentation

## 2018-07-27 DIAGNOSIS — Z85118 Personal history of other malignant neoplasm of bronchus and lung: Secondary | ICD-10-CM | POA: Diagnosis not present

## 2018-07-27 DIAGNOSIS — F1411 Cocaine abuse, in remission: Secondary | ICD-10-CM | POA: Insufficient documentation

## 2018-07-27 DIAGNOSIS — F1910 Other psychoactive substance abuse, uncomplicated: Secondary | ICD-10-CM | POA: Diagnosis present

## 2018-07-27 DIAGNOSIS — B182 Chronic viral hepatitis C: Secondary | ICD-10-CM | POA: Diagnosis not present

## 2018-07-27 DIAGNOSIS — C3411 Malignant neoplasm of upper lobe, right bronchus or lung: Secondary | ICD-10-CM | POA: Diagnosis not present

## 2018-07-27 DIAGNOSIS — J441 Chronic obstructive pulmonary disease with (acute) exacerbation: Secondary | ICD-10-CM | POA: Diagnosis present

## 2018-07-27 DIAGNOSIS — I10 Essential (primary) hypertension: Secondary | ICD-10-CM | POA: Diagnosis present

## 2018-07-27 DIAGNOSIS — Z982 Presence of cerebrospinal fluid drainage device: Secondary | ICD-10-CM | POA: Insufficient documentation

## 2018-07-27 DIAGNOSIS — F1911 Other psychoactive substance abuse, in remission: Secondary | ICD-10-CM | POA: Diagnosis not present

## 2018-07-27 DIAGNOSIS — J9621 Acute and chronic respiratory failure with hypoxia: Secondary | ICD-10-CM | POA: Diagnosis not present

## 2018-07-27 DIAGNOSIS — Z79899 Other long term (current) drug therapy: Secondary | ICD-10-CM | POA: Insufficient documentation

## 2018-07-27 DIAGNOSIS — G40909 Epilepsy, unspecified, not intractable, without status epilepticus: Secondary | ICD-10-CM

## 2018-07-27 DIAGNOSIS — Z8249 Family history of ischemic heart disease and other diseases of the circulatory system: Secondary | ICD-10-CM | POA: Diagnosis not present

## 2018-07-27 DIAGNOSIS — J9601 Acute respiratory failure with hypoxia: Secondary | ICD-10-CM | POA: Diagnosis present

## 2018-07-27 DIAGNOSIS — Z87891 Personal history of nicotine dependence: Secondary | ICD-10-CM | POA: Diagnosis present

## 2018-07-27 DIAGNOSIS — I110 Hypertensive heart disease with heart failure: Secondary | ICD-10-CM | POA: Diagnosis not present

## 2018-07-27 DIAGNOSIS — F1010 Alcohol abuse, uncomplicated: Secondary | ICD-10-CM | POA: Insufficient documentation

## 2018-07-27 DIAGNOSIS — Z79890 Hormone replacement therapy (postmenopausal): Secondary | ICD-10-CM | POA: Insufficient documentation

## 2018-07-27 DIAGNOSIS — I5032 Chronic diastolic (congestive) heart failure: Secondary | ICD-10-CM | POA: Diagnosis not present

## 2018-07-27 DIAGNOSIS — F17210 Nicotine dependence, cigarettes, uncomplicated: Secondary | ICD-10-CM | POA: Insufficient documentation

## 2018-07-27 MED ORDER — ALBUTEROL SULFATE HFA 108 (90 BASE) MCG/ACT IN AERS
8.0000 | INHALATION_SPRAY | Freq: Once | RESPIRATORY_TRACT | Status: AC
  Administered 2018-07-27: 8 via RESPIRATORY_TRACT
  Filled 2018-07-27: qty 6.7

## 2018-07-27 MED ORDER — NICOTINE 21 MG/24HR TD PT24
21.0000 mg | MEDICATED_PATCH | Freq: Once | TRANSDERMAL | Status: AC
  Administered 2018-07-27: 21 mg via TRANSDERMAL
  Filled 2018-07-27: qty 1

## 2018-07-27 NOTE — ED Notes (Signed)
Recollected lavender top.

## 2018-07-27 NOTE — ED Provider Notes (Signed)
[facility] EMERGENCY DEPARTMENT Provider Note   CSN: [id number] Arrival date & time: 07/27/18  2227    History   Chief Complaint Chief Complaint  Patient presents with   Shortness of Breath    HPI Wanda Prince is a 63 y.o. female with a history of stage IV COPD, cocaine abuse in remission, cavitating lung mass, chronic hepatitis C, and hepatocellular carcinoma who presents to the emergency department by EMS with a chief complaint of shortness of breath.  The patient versus constant, worsening shortness of breath, onset tonight.  She reports she has been on a prednisone taper for the last 2 weeks.  She treated her symptoms by using her albuterol inhaler several times and her albuterol nebulizer at home prior to arrival with minimal improvement.  She states that her shortness of breath feels consistent with prior COPD exacerbations.  EMS notes the patient had tripoding and accessory muscle use on arrival.  She was able to ambulate short distance to the stretcher.  She significantly improved on 15 L nonrebreather.  EMS notes diminished lung sounds in the bilateral lower lobes on exhalation.  She was given 125 mg of Solu-Medrol in route.  She denies chest pain, fever, chills, productive cough, back pain, diaphoresis, dizziness, lightheadedness, headache, visual changes, leg swelling, orthopnea, palpitations, abdominal pain, or N/V/D.   The patient denies alcohol use.  Per chart review, the patient was seen by family practice on March 31, 2018 and the patient's sister suspected that she continues to drink periodically.     The history is provided by the patient. No language interpreter was used.    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   COPD (chronic obstructive pulmonary disease) ([place])      H/O tracheostomy 04/22/2015   respiratory destress    History of acute alcoholic hepatitis    History of alcohol abuse    History of cocaine abuse ([place])    History of iron deficiency    Hx of ventricular shunt 2009   Hypertension    Left breast mass    Major depressive disorder    History of   Meningitis 2009   Primary cancer of right upper lobe of lung ([place]) 07/12/2015   Psychiatric diagnosis    Multiple   Respiratory arrest ([place])    secondary to Subependymal abscess   Seizure disorder ([place])    Seizures ([place])    Suicide attempt [facility])    History of suicide attempts August 2006 and July 2006    Patient Active Problem List   Diagnosis Date Noted   Polysubstance abuse ([place]) 07/28/2018   Chronic respiratory failure with hypercapnia ([place]) 02/26/2018   Liver lesion 05/11/2016   Hepatocellular carcinoma ([place]) 05/10/2016   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/01/2016   DOE (dyspnea on exertion) 04/01/2016   Preoperative cardiovascular examination 04/01/2016   Primary cancer of right upper lobe of lung ([place]) 07/12/2015   COPD  GOLD IV  05/18/2015   Acute respiratory failure (HCC)    Cavitating mass in right upper lung lobe    Liver mass    Encounter for imaging study to confirm orogastric (OG) tube placement    Encounter for nasogastric (NG) tube placement    Acute on chronic respiratory failure with hypoxia ([place])    COPD exacerbation ([place]) 04/22/2015  COPD with acute exacerbation ([place]) 01/10/2014   Acute respiratory failure with hypoxia and hypercapnia (HCC) 01/10/2014   Essential hypertension 01/10/2014   Seizure disorder ([place]) 01/25/2012   Chronic diastolic CHF (congestive heart failure) ([place]) 01/25/2012   Coronary artery disease, non-occlusive 06/07/2011   Cigarette smoker 06/04/2011   Alcohol abuse 06/04/2011   History of Abscess, brain 06/03/2011   History of suicide attempt 06/03/2011   Intracranial hemorrhage ([place])  06/03/2011    Past Surgical History:  Procedure Laterality Date   BACK SURGERY     BRAIN SURGERY     BREAST EXCISIONAL BIOPSY Right 1982   breast mass removal     BREAST SURGERY     CRANIOTOMY     IR GENERIC HISTORICAL  02/28/2016   IR RADIOLOGIST EVAL & MGMT 02/28/2016 Aletta Edouard, MD [facility]   IR GENERIC HISTORICAL  05/10/2016   IR VENIPUNCTURE 69YRS/OLDER BY MD [facility]   IR GENERIC HISTORICAL  05/10/2016   IR US GUIDE VASC ACCESS RIGHT [facility]   IR GENERIC HISTORICAL  06/26/2016   IR RADIOLOGIST EVAL & MGMT 06/26/2016 Aletta Edouard, MD [facility]   LEFT HEART CATHETERIZATION WITH CORONARY ANGIOGRAM N/A 06/06/2011   Procedure: LEFT HEART CATHETERIZATION WITH CORONARY ANGIOGRAM;  Surgeon: Burnell Blanks, MD;  Location: [facility] CATH LAB: Moderate ~ 40% RCA disease noted. Otherwise normal   RADIOLOGY WITH ANESTHESIA N/A 05/10/2016   Procedure: Microwave thermal ablation liver;  Surgeon: Aletta Edouard, MD;  Location: [facility];  Service: Radiology;  Laterality: N/A;   TRANSTHORACIC ECHOCARDIOGRAM  05/2011   Insetting of seizure, positive troponin. EF 30-35% with anterior hypokinesis.   TRANSTHORACIC ECHOCARDIOGRAM  12/2013   EF 60-65%. GR 1 DD. No significant valvular disease.     OB History    Gravida  2   Para  1   Term      Preterm      AB  1   Living        SAB      TAB      Ectopic      Multiple      Live Births               Home Medications    Prior to Admission medications   Medication Sig Start Date End Date Taking? Authorizing Provider  acetaminophen (TYLENOL) 500 MG tablet Take 1,000 mg by mouth every 6 (six) hours as needed for pain.   Yes [provider]  albuterol (PROVENTIL HFA;VENTOLIN HFA) 108 (90 BASE) MCG/ACT inhaler Inhale 2 puffs into the lungs every 4 (four) hours as needed for wheezing or shortness of breath.    Yes [provider]  albuterol (PROVENTIL) (2.5 MG/3ML) 0.083%  nebulizer solution USE 1 VIAL IN NEBULIZER EVERY 4 HOURS AS NEEDED FOR WHEEZING OR SHORTNESS OF BREATH Patient taking differently: Take 2.5 mg by nebulization every 4 (four) hours as needed for wheezing or shortness of breath.  05/29/18  Yes Tanda Rockers, MD  b complex vitamins tablet Take 1 tablet by mouth 2 (two) times daily. Patient taking differently: Take 1 tablet by mouth daily.  01/28/12  Yes Eugenie Filler, MD  BEVESPI AEROSPHERE 9-4.8 MCG/ACT AERO INHALE 2 PUFFS INTO THE LUNGS 2 TIMES A DAY Patient taking differently: Inhale 2 puffs into the lungs 2 (two) times daily.  08/29/17  Yes Tanda Rockers, MD  Cholecalciferol ([employer] D3) 2000 units CAPS Take 2,000 Units by mouth daily.  Yes [provider]  cyclobenzaprine (FLEXERIL) 5 MG tablet Take 5 mg by mouth every 8 (eight) hours as needed for muscle spasms.  11/22/13  Yes [provider]  folic acid (FOLVITE) 1 MG tablet Take 1 mg by mouth daily.   Yes [provider]  gabapentin (NEURONTIN) 300 MG capsule Take 600 mg by mouth at bedtime.    Yes [provider]  levETIRAcetam (KEPPRA) 500 MG tablet Take 1 tablet (500 mg total) by mouth 2 (two) times daily. 01/28/12  Yes Eugenie Filler, MD  metoprolol succinate (TOPROL XL) 25 MG 24 hr tablet Take 25 mg by mouth daily.  09/30/13  Yes [provider]  Multiple Vitamin (MULITIVITAMIN WITH MINERALS) TABS Take 1 tablet by mouth daily. 06/07/11  Yes Dorian Heckle, MD  naltrexone (DEPADE) 50 MG tablet Take 50 mg by mouth daily. 06/17/18  Yes [provider]  predniSONE (DELTASONE) 10 MG tablet TAKE 2 TABLETS BY MOUTH DAILY, UNTIL BREATHING BETTER, THEN TAKE ONE TABLET DAILY. Patient taking differently: Take 10 mg by mouth daily.  07/03/18  Yes Tanda Rockers, MD  spironolactone (ALDACTONE) 25 MG tablet Take 25 mg by mouth daily.   Yes [provider]  thiamine 100 MG tablet Take 1 tablet (100 mg total) by mouth daily. 06/07/11  Yes  Dorian Heckle, MD    Family History Family History  Problem Relation Age of Onset   Coronary artery disease Father    Breast cancer Maternal Aunt 60    Social History Social History   Tobacco Use   Smoking status: Current Every Day Smoker    Packs/day: 1.00    Years: 40.00    Pack years: 40.00    Types: Cigarettes   Smokeless tobacco: Never Used  Substance Use Topics   Alcohol use: Yes    Alcohol/week: 8.0 standard drinks    Types: 8 Cans of beer per week    Comment: per week   Drug use: Yes    Types: Cocaine    Comment: not in years     Allergies   Patient has no known allergies.   Review of Systems Review of Systems  Constitutional: Negative for activity change, chills and fever.  Respiratory: Positive for shortness of breath.   Cardiovascular: Negative for chest pain, palpitations and leg swelling.  Gastrointestinal: Negative for abdominal pain, diarrhea, nausea and vomiting.  Genitourinary: Negative for dysuria, flank pain, hematuria and urgency.  Musculoskeletal: Negative for back pain, myalgias, neck pain and neck stiffness.  Skin: Negative for rash and wound.  Allergic/Immunologic: Negative for immunocompromised state.  Neurological: Negative for dizziness, seizures, syncope, weakness and headaches.  Psychiatric/Behavioral: Negative for confusion.   Physical Exam Updated Vital Signs BP 126/83 (BP Location: Right Arm)    Pulse (!) 103    Temp 98.3 F (36.8 C) (Oral)    Resp 18    Ht 5\' 8"  (1.727 m)    Wt 42 kg    LMP  (LMP Unknown)    SpO2 98%    BMI 14.09 kg/m   Physical Exam Vitals signs and nursing note reviewed.  Constitutional:      General: She is not in acute distress.    Comments: Cachectic, chronically ill-appearing.  Bilateral severe temporal wasting.  HENT:     Head: Normocephalic.  Eyes:     General: No scleral icterus.    Extraocular Movements: Extraocular movements intact.     Conjunctiva/sclera: Conjunctivae normal.      Pupils:  Pupils are equal, round, and reactive to light.  Neck:     Musculoskeletal: Normal range of motion and neck supple.  Cardiovascular:     Rate and Rhythm: Normal rate and regular rhythm.     Heart sounds: No murmur. No friction rub. No gallop.   Pulmonary:     Effort: Pulmonary effort is normal. No respiratory distress.     Breath sounds: Decreased breath sounds present.     Comments: Lung sounds are clear, but diminished bilaterally.  No adventitious breath sounds.  No tripoding, accessory muscle use, or retractions. Abdominal:     General: There is no distension.     Palpations: Abdomen is soft.  Musculoskeletal:     Right lower leg: No edema.     Left lower leg: No edema.  Skin:    General: Skin is warm.     Findings: No rash.  Neurological:     Mental Status: She is alert.  Psychiatric:        Behavior: Behavior normal.      ED Treatments / Results  Labs (all labs ordered are listed, but only abnormal results are displayed) Labs Reviewed  BASIC METABOLIC PANEL - Abnormal; Notable for the following components:      Result Value   Glucose, Bld 106 (*)    All other components within normal limits  CBC - Abnormal; Notable for the following components:   Hemoglobin 15.7 (*)    HCT 48.4 (*)    Platelets 124 (*)    All other components within normal limits  EXPECTORATED SPUTUM ASSESSMENT W REFEX TO RESP CULTURE  MAGNESIUM  INFLUENZA PANEL BY PCR (TYPE A & B)  HIV ANTIBODY (ROUTINE TESTING W REFLEX)    EKG EKG Interpretation  Date/Time:  Monday July 27 2018 22:30:37 EDT Ventricular Rate:  92 PR Interval:    QRS Duration: 70 QT Interval:  356 QTC Calculation: 441 R Axis:   86 Text Interpretation:  Sinus rhythm Borderline right axis deviation Minimal ST elevation, inferior leads No significant change since last tracing other than rate is slower Confirmed by Pryor Curia [phone]) on 07/27/2018 11:22:35 PM   Radiology Dg Chest 2 View  Result Date:  07/27/2018 CLINICAL DATA:  63 year old female with shortness of breath. History of lung cancer. EXAM: CHEST - 2 VIEW COMPARISON:  Chest radiograph dated 02/23/2018 and CT dated 06/25/2016 FINDINGS: There is emphysematous changes of the lungs with flattening of the diaphragms. There is an 11 mm left upper lobe nodule as seen on the prior CT and radiograph. No focal consolidation, pleural effusion, or pneumothorax. The cardiac silhouette is within normal limits. Atherosclerotic calcification of the aortic arch. No acute osseous pathology. Partially visualized right-sided VP shunt. IMPRESSION: 1. No acute cardiopulmonary process. 2. Emphysema. 3. Left upper lobe nodule and seen on the prior studies. Electronically Signed   By: Anner Crete M.D.   On: 07/27/2018 23:36    Procedures Procedures (including critical care time)  Medications Ordered in ED Medications  nicotine (NICODERM CQ - dosed in mg/24 hours) patch 21 mg (21 mg Transdermal Patch Applied 07/27/18 2328)  metoprolol succinate (TOPROL-XL) 24 hr tablet 25 mg (has no administration in time range)  spironolactone (ALDACTONE) tablet 25 mg (has no administration in time range)  folic acid (FOLVITE) tablet 1 mg (has no administration in time range)  naltrexone (DEPADE) tablet 50 mg (has no administration in time range)  cyclobenzaprine (FLEXERIL) tablet 5 mg (has no administration in time range)  levETIRAcetam (  KEPPRA) tablet 500 mg (500 mg Oral Given 07/28/18 0254)  B-complex with vitamin C tablet 1 tablet (has no administration in time range)  cholecalciferol (VITAMIN D3) tablet 2,000 Units (has no administration in time range)  multivitamin with minerals tablet 1 tablet (has no administration in time range)  thiamine (VITAMIN B-1) tablet 100 mg (has no administration in time range)  levalbuterol (XOPENEX) nebulizer solution 1.25 mg (1.25 mg Nebulization Given 07/28/18 0254)  dextromethorphan-guaiFENesin (MUCINEX DM) 30-600 MG per 12 hr  tablet 1 tablet (has no administration in time range)  methylPREDNISolone sodium succinate (SOLU-MEDROL) 125 mg/2 mL injection 60 mg (60 mg Intravenous Given 07/28/18 0254)  LORazepam (ATIVAN) injection 1 mg (has no administration in time range)  LORazepam (ATIVAN) tablet 1 mg (has no administration in time range)    Or  LORazepam (ATIVAN) injection 1 mg (has no administration in time range)  LORazepam (ATIVAN) injection 0-4 mg (has no administration in time range)    Followed by  LORazepam (ATIVAN) injection 0-4 mg (has no administration in time range)  enoxaparin (LOVENOX) injection 40 mg (has no administration in time range)  ondansetron (ZOFRAN) injection 4 mg (has no administration in time range)  acetaminophen (TYLENOL) tablet 650 mg (has no administration in time range)  hydrALAZINE (APRESOLINE) injection 5 mg (has no administration in time range)  pneumococcal 23 valent vaccine (PNU-IMMUNE) injection 0.5 mL (has no administration in time range)  ipratropium (ATROVENT) nebulizer solution 0.5 mg (has no administration in time range)  feeding supplement (ENSURE ENLIVE) (ENSURE ENLIVE) liquid 237 mL (has no administration in time range)  albuterol (PROVENTIL HFA;VENTOLIN HFA) 108 (90 Base) MCG/ACT inhaler 8 puff (8 puffs Inhalation Given 07/27/18 2329)  magnesium sulfate IVPB 2 g 50 mL (0 g Intravenous Stopped 07/28/18 0304)     Initial Impression / Assessment and Plan / ED Course  I have reviewed the triage vital signs and the nursing notes.  Pertinent labs & imaging results that were available during my care of the patient were reviewed by me and considered in my medical decision making (see chart for details).        63 year old female with a history of stage IV COPD, cocaine abuse in remission, cavitating lung mass, chronic hepatitis C, and hepatocellular carcinoma presenting with shortness of breath, onset today.  She has no associated chest pain or constitutional symptoms.  No  recent travel or sick contacts.  Reports her symptoms feel consistent with COPD exacerbations that she has previously had.  She has been on a course of prednisone for the last few weeks and used her home nebulizer and albuterol inhaler with minimal relief in her symptoms.  She was placed on a nonrebreather by EMS after she was noted to be tripoding and had accessory muscle use on arrival. The patient was discussed with Dr. Leonides Schanz, attending physician.   Influenza panel is negative.  Metabolic panel is reassuring.  No leukocytosis on CBC.  No acute changes on EKG.  CXR with left upper lobe nodule as seen on prior studies and emphysema.  Otherwise no acute changes.  In the ER, she was given 8 puffs of an albuterol inhaler in lieu of aerosolized nebulizer treatment due to COVID-19 pandemic.  She was given 125 mg of Solu-Medrol by EMS.  Patient reports that she is feeling much better after albuterol.  She was ambulated on room air and sats dropped into the 70s.  She was also given a dose of magnesium sulfate.  Given new oxygen  requirement, the patient would benefit from further observation and treatment inpatient.  Consult to the hospitalist team and spoke with Dr. Blaine Hamper who will accept the patient for admission.  Low suspicion for ACS, PE, pneumonia, PTX, or COVID-19. The patient appears reasonably stabilized for admission considering the current resources, flow, and capabilities available in the ED at this time, and I doubt any other [facility] requiring further screening and/or treatment in the ED prior to admission.  Final Clinical Impressions(s) / ED Diagnoses   Final diagnoses:  COPD exacerbation [facility])    ED Discharge Orders    None       Calaya Gildner A, PA-C 07/28/18 0507    Ward, Delice Bison, DO 07/28/18 [phone]

## 2018-07-27 NOTE — ED Notes (Signed)
Patient transported to X-ray 

## 2018-07-27 NOTE — ED Triage Notes (Signed)
Pt from home via [facility] reports SOB x 2 hours, tried to use at home neb and rescue inhaler with no relief, tripoding with accessory muscle use when EMS arrived, able to ambulate short distance to stretcher, significant improvement with 15 L non-rebreather, Diminished lung sound lower lobes upon exhalation, A&O x4. 125 Solumedrol given. Currently taking prednisone.

## 2018-07-27 NOTE — ED Notes (Signed)
Per Lab, CBC clotted and needs re-collecting.

## 2018-07-28 ENCOUNTER — Other Ambulatory Visit: Payer: Self-pay

## 2018-07-28 DIAGNOSIS — I10 Essential (primary) hypertension: Secondary | ICD-10-CM

## 2018-07-28 DIAGNOSIS — J9601 Acute respiratory failure with hypoxia: Secondary | ICD-10-CM

## 2018-07-28 DIAGNOSIS — J9621 Acute and chronic respiratory failure with hypoxia: Secondary | ICD-10-CM

## 2018-07-28 DIAGNOSIS — F1910 Other psychoactive substance abuse, uncomplicated: Secondary | ICD-10-CM | POA: Diagnosis present

## 2018-07-28 DIAGNOSIS — G40909 Epilepsy, unspecified, not intractable, without status epilepticus: Secondary | ICD-10-CM

## 2018-07-28 DIAGNOSIS — J441 Chronic obstructive pulmonary disease with (acute) exacerbation: Secondary | ICD-10-CM | POA: Diagnosis not present

## 2018-07-28 LAB — CBC
HCT: 48.4 % — ABNORMAL HIGH (ref 36.0–46.0)
Hemoglobin: 15.7 g/dL — ABNORMAL HIGH (ref 12.0–15.0)
MCH: 31.8 pg (ref 26.0–34.0)
MCHC: 32.4 g/dL (ref 30.0–36.0)
MCV: 98 fL (ref 80.0–100.0)
Platelets: 124 10*3/uL — ABNORMAL LOW (ref 150–400)
RBC: 4.94 MIL/uL (ref 3.87–5.11)
RDW: 13.2 % (ref 11.5–15.5)
WBC: 9.8 10*3/uL (ref 4.0–10.5)
nRBC: 0 % (ref 0.0–0.2)

## 2018-07-28 LAB — BASIC METABOLIC PANEL
Anion gap: 9 (ref 5–15)
BUN: 7 mg/dL (ref 6–20)
CO2: 29 mmol/L (ref 22–32)
Calcium: 8.9 mg/dL (ref 8.9–10.3)
Chloride: 98 mmol/L (ref 98–111)
Creatinine, Ser: 0.71 mg/dL (ref 0.44–1.00)
GFR calc Af Amer: >60 mL/min (ref >60)
GFR calc non Af Amer: >60 mL/min (ref >60)
Glucose, Bld: 106 mg/dL — ABNORMAL HIGH (ref 70–99)
Potassium: 4.3 mmol/L (ref 3.5–5.1)
Sodium: 136 mmol/L (ref 135–145)

## 2018-07-28 LAB — HIV ANTIBODY (ROUTINE TESTING W REFLEX): HIV Screen 4th Generation wRfx: NONREACTIVE

## 2018-07-28 LAB — INFLUENZA PANEL BY PCR (TYPE A & B)
Influenza A By PCR: NEGATIVE
Influenza B By PCR: NEGATIVE

## 2018-07-28 LAB — MAGNESIUM: Magnesium: 1.8 mg/dL (ref 1.7–2.4)

## 2018-07-28 MED ORDER — FOLIC ACID 1 MG PO TABS
1.0000 mg | ORAL_TABLET | Freq: Every day | ORAL | Status: DC
  Administered 2018-07-28 – 2018-07-29 (×2): 1 mg via ORAL
  Filled 2018-07-28 (×2): qty 1

## 2018-07-28 MED ORDER — ONDANSETRON HCL 4 MG/2ML IJ SOLN: 4.0000 mg | Freq: Three times a day (TID) | INTRAMUSCULAR | Status: DC | PRN

## 2018-07-28 MED ORDER — MAGNESIUM SULFATE 2 GM/50ML IV SOLN
2.0000 g | Freq: Once | INTRAVENOUS | Status: AC
  Administered 2018-07-28: 2 g via INTRAVENOUS
  Filled 2018-07-28: qty 50

## 2018-07-28 MED ORDER — LORAZEPAM 1 MG PO TABS: 1.0000 mg | ORAL_TABLET | Freq: Four times a day (QID) | ORAL | Status: DC | PRN

## 2018-07-28 MED ORDER — SPIRONOLACTONE 25 MG PO TABS
25.0000 mg | ORAL_TABLET | Freq: Every day | ORAL | Status: DC
  Administered 2018-07-28 – 2018-07-29 (×2): 25 mg via ORAL
  Filled 2018-07-28 (×2): qty 1

## 2018-07-28 MED ORDER — LEVALBUTEROL HCL 1.25 MG/0.5ML IN NEBU
1.2500 mg | INHALATION_SOLUTION | Freq: Two times a day (BID) | RESPIRATORY_TRACT | Status: DC
  Administered 2018-07-28 – 2018-07-29 (×2): 1.25 mg via RESPIRATORY_TRACT
  Filled 2018-07-28 (×2): qty 0.5

## 2018-07-28 MED ORDER — LORAZEPAM 2 MG/ML IJ SOLN: 1.0000 mg | Freq: Four times a day (QID) | INTRAMUSCULAR | Status: DC | PRN

## 2018-07-28 MED ORDER — ADULT MULTIVITAMIN W/MINERALS CH
1.0000 | ORAL_TABLET | Freq: Every day | ORAL | Status: DC
  Administered 2018-07-28 – 2018-07-29 (×2): 1 via ORAL
  Filled 2018-07-28 (×2): qty 1

## 2018-07-28 MED ORDER — IPRATROPIUM BROMIDE 0.02 % IN SOLN: 0.5000 mg | RESPIRATORY_TRACT | Status: DC

## 2018-07-28 MED ORDER — LORAZEPAM 2 MG/ML IJ SOLN
0.0000 mg | Freq: Four times a day (QID) | INTRAMUSCULAR | Status: DC
  Filled 2018-07-28: qty 1

## 2018-07-28 MED ORDER — B COMPLEX-C PO TABS
1.0000 | ORAL_TABLET | Freq: Every day | ORAL | Status: DC
  Administered 2018-07-28 – 2018-07-29 (×2): 1 via ORAL
  Filled 2018-07-28 (×2): qty 1

## 2018-07-28 MED ORDER — LORAZEPAM 2 MG/ML IJ SOLN: 0.0000 mg | Freq: Two times a day (BID) | INTRAMUSCULAR | Status: DC

## 2018-07-28 MED ORDER — LEVALBUTEROL HCL 1.25 MG/0.5ML IN NEBU
1.2500 mg | INHALATION_SOLUTION | Freq: Four times a day (QID) | RESPIRATORY_TRACT | Status: DC
  Administered 2018-07-28 (×2): 1.25 mg via RESPIRATORY_TRACT
  Filled 2018-07-28 (×2): qty 0.5

## 2018-07-28 MED ORDER — ENSURE ENLIVE PO LIQD
237.0000 mL | Freq: Two times a day (BID) | ORAL | Status: DC
  Administered 2018-07-28 – 2018-07-29 (×3): 237 mL via ORAL

## 2018-07-28 MED ORDER — LEVETIRACETAM 500 MG PO TABS
500.0000 mg | ORAL_TABLET | Freq: Two times a day (BID) | ORAL | Status: DC
  Administered 2018-07-28 – 2018-07-29 (×4): 500 mg via ORAL
  Filled 2018-07-28 (×4): qty 1

## 2018-07-28 MED ORDER — IPRATROPIUM BROMIDE 0.02 % IN SOLN
0.5000 mg | Freq: Four times a day (QID) | RESPIRATORY_TRACT | Status: DC
  Administered 2018-07-28: 0.5 mg via RESPIRATORY_TRACT
  Filled 2018-07-28: qty 2.5

## 2018-07-28 MED ORDER — NALTREXONE HCL 50 MG PO TABS
50.0000 mg | ORAL_TABLET | Freq: Every day | ORAL | Status: DC
  Administered 2018-07-28 – 2018-07-29 (×2): 50 mg via ORAL
  Filled 2018-07-28 (×2): qty 1

## 2018-07-28 MED ORDER — ENOXAPARIN SODIUM 40 MG/0.4ML ~~LOC~~ SOLN
40.0000 mg | Freq: Every day | SUBCUTANEOUS | Status: DC
  Administered 2018-07-28 – 2018-07-29 (×2): 40 mg via SUBCUTANEOUS
  Filled 2018-07-28 (×2): qty 0.4

## 2018-07-28 MED ORDER — LORAZEPAM 2 MG/ML IJ SOLN: 1.0000 mg | INTRAMUSCULAR | Status: DC | PRN

## 2018-07-28 MED ORDER — ACETAMINOPHEN 325 MG PO TABS: 650.0000 mg | ORAL_TABLET | Freq: Four times a day (QID) | ORAL | Status: DC | PRN

## 2018-07-28 MED ORDER — METOPROLOL SUCCINATE ER 25 MG PO TB24
25.0000 mg | ORAL_TABLET | Freq: Every day | ORAL | Status: DC
  Administered 2018-07-28 – 2018-07-29 (×2): 25 mg via ORAL
  Filled 2018-07-28 (×2): qty 1

## 2018-07-28 MED ORDER — HYDRALAZINE HCL 20 MG/ML IJ SOLN: 5.0000 mg | INTRAMUSCULAR | Status: DC | PRN

## 2018-07-28 MED ORDER — PNEUMOCOCCAL VAC POLYVALENT 25 MCG/0.5ML IJ INJ
0.5000 mL | INJECTION | INTRAMUSCULAR | Status: DC
  Filled 2018-07-28: qty 0.5

## 2018-07-28 MED ORDER — MOMETASONE FURO-FORMOTEROL FUM 200-5 MCG/ACT IN AERO
2.0000 | INHALATION_SPRAY | Freq: Two times a day (BID) | RESPIRATORY_TRACT | Status: DC
  Administered 2018-07-28 – 2018-07-29 (×3): 2 via RESPIRATORY_TRACT
  Filled 2018-07-28: qty 8.8

## 2018-07-28 MED ORDER — CYCLOBENZAPRINE HCL 5 MG PO TABS: 5.0000 mg | ORAL_TABLET | Freq: Three times a day (TID) | ORAL | Status: DC | PRN

## 2018-07-28 MED ORDER — IPRATROPIUM BROMIDE 0.02 % IN SOLN
0.5000 mg | Freq: Two times a day (BID) | RESPIRATORY_TRACT | Status: DC
  Administered 2018-07-28 – 2018-07-29 (×2): 0.5 mg via RESPIRATORY_TRACT
  Filled 2018-07-28 (×2): qty 2.5

## 2018-07-28 MED ORDER — AZITHROMYCIN 500 MG PO TABS
500.0000 mg | ORAL_TABLET | Freq: Every day | ORAL | Status: DC
  Administered 2018-07-28 – 2018-07-29 (×2): 500 mg via ORAL
  Filled 2018-07-28 (×2): qty 1

## 2018-07-28 MED ORDER — VITAMIN B-1 100 MG PO TABS
100.0000 mg | ORAL_TABLET | Freq: Every day | ORAL | Status: DC
  Administered 2018-07-28 – 2018-07-29 (×2): 100 mg via ORAL
  Filled 2018-07-28 (×2): qty 1

## 2018-07-28 MED ORDER — VITAMIN D 25 MCG (1000 UNIT) PO TABS
2000.0000 [IU] | ORAL_TABLET | Freq: Every day | ORAL | Status: DC
  Administered 2018-07-28 – 2018-07-29 (×2): 2000 [IU] via ORAL
  Filled 2018-07-28 (×2): qty 2

## 2018-07-28 MED ORDER — DM-GUAIFENESIN ER 30-600 MG PO TB12
1.0000 | ORAL_TABLET | Freq: Two times a day (BID) | ORAL | Status: DC | PRN
  Administered 2018-07-28: 1 via ORAL
  Filled 2018-07-28: qty 1

## 2018-07-28 MED ORDER — METHYLPREDNISOLONE SODIUM SUCC 125 MG IJ SOLR
60.0000 mg | Freq: Three times a day (TID) | INTRAMUSCULAR | Status: DC
  Administered 2018-07-28 – 2018-07-29 (×4): 60 mg via INTRAVENOUS
  Filled 2018-07-28 (×5): qty 2

## 2018-07-28 NOTE — ED Notes (Signed)
Pt oxygen saturation was 885% when I walked in the room to walk the pt. Oxygen was not in nose. Oxygen saturation came up to 96% after placement. Walked to the bathroom and back. Oxygen dropped to 79%. Increased oxygen to 4L. Pt saturation increased to 95%. Put oxygen back on 2L per RN.

## 2018-07-28 NOTE — Progress Notes (Signed)
Pt refused bed alarm. Pt is a high fall risk and was educated.

## 2018-07-28 NOTE — H&P (Addendum)
History and Physical    Wanda Prince MBT:[id number] DOB: Jul 08, 1955 DOA: 07/27/2018  Referring MD/NP/PA:   PCP: Katherina Mires, MD   Patient coming from:  The patient is coming from home.  At baseline, pt is independent for most of ADL.        Chief Complaint: SOB  HPI: Wanda Prince is a 63 y.o. female with medical history significant of stage IV COPD, polysubstance abuse (cocaine abuse in remission, alcohol and tobacco), chronic hepatitis C, and hepatocellular carcinoma (s/p of ablation), RUL lung cancer (s/p of XRT, no chemo surgery), hypertension, depression, seizure, remote intracranial hemorrhage, who presents with shortness of breath.  Patient states that she started having worsening shortness of breath tonight, which has been progressively getting worse.  She denies cough, No chest pain, fever or chills.  No sick contact.  No recent long distance traveling. She reports she has been on prednisone taper for the last 2 weeks.  EMS notes the patient had tripoding and accessory muscle use on arrival. She was given 125 mg of Solu-Medrol in route.  Patient does not have nausea vomiting, diarrhea, abdominal pain, symptoms of UTI or unilateral weakness.   ED Course: pt was found to have WBC 9.8, electrolytes renal function okay, temperature normal, tachycardia, oxygen desaturation to 79% on ambulation, which improved to 97% on 2 L nasal cannula oxygen.  Chest x-ray is negative for infiltration, showed emphysema and old LUL nodules. Pt is placed on telemetry bed of observation.  Review of Systems:   General: no fevers, chills, no body weight gain, has fatigue HEENT: no blurry vision, hearing changes or sore throat Respiratory: has dyspnea, no coughing, wheezing CV: no chest pain, no palpitations GI: no nausea, vomiting, abdominal pain, diarrhea, constipation GU: no dysuria, burning on urination, increased urinary frequency, hematuria  Ext: no leg edema Neuro: no unilateral weakness,  numbness, or tingling, no vision change or hearing loss Skin: no rash, no skin tear. MSK: No muscle spasm, no deformity, no limitation of range of movement in spin Heme: No easy bruising.  Travel history: No recent long distant travel.  Allergy: No Known Allergies  Past Medical History:  Diagnosis Date  . 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 COPD (chronic obstructive pulmonary disease) ([place])   . H/O tracheostomy 04/22/2015   respiratory destress   . History of acute alcoholic hepatitis   . History of alcohol abuse   . History of cocaine abuse ([place])   . History of iron deficiency   . Hx of ventricular shunt 2009  . Hypertension   . Left breast mass   . Major depressive disorder    History of  . Meningitis 2009  . Primary cancer of right upper lobe of lung ([place]) 07/12/2015  . Psychiatric diagnosis    Multiple  . 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 Seizure disorder ([place])   . Seizures ([place])   . Suicide attempt [facility])    History of suicide attempts August 2006 and July 2006    Past Surgical History:  Procedure Laterality Date  . BACK SURGERY    . BRAIN SURGERY    . BREAST EXCISIONAL BIOPSY Right 1982  . breast mass removal    . BREAST SURGERY    . CRANIOTOMY    . IR GENERIC HISTORICAL  02/28/2016  IR RADIOLOGIST EVAL & MGMT 02/28/2016 Aletta Edouard, MD [facility]  . IR GENERIC HISTORICAL  05/10/2016   IR VENIPUNCTURE 68YRS/OLDER BY MD [facility]  . IR GENERIC HISTORICAL  05/10/2016   IR US GUIDE VASC ACCESS RIGHT [facility]  . IR GENERIC HISTORICAL  06/26/2016   IR RADIOLOGIST EVAL & MGMT 06/26/2016 Aletta Edouard, MD [facility] INTER[facility]  . LEFT HEART CATHETERIZATION WITH CORONARY ANGIOGRAM N/A 06/06/2011   Procedure: LEFT HEART CATHETERIZATION WITH CORONARY ANGIOGRAM;  Surgeon: Burnell Blanks,  MD;  Location: [facility] CATH LAB: Moderate ~ 40% RCA disease noted. Otherwise normal  . RADIOLOGY WITH ANESTHESIA N/A 05/10/2016   Procedure: Microwave thermal ablation liver;  Surgeon: Aletta Edouard, MD;  Location: [facility];  Service: Radiology;  Laterality: N/A;  . TRANSTHORACIC ECHOCARDIOGRAM  05/2011   Insetting of seizure, positive troponin. EF 30-35% with anterior hypokinesis.  . TRANSTHORACIC ECHOCARDIOGRAM  12/2013   EF 60-65%. GR 1 DD. No significant valvular disease.    Social History:  reports that she has been smoking cigarettes. She has a 40.00 pack-year smoking history. She has never used smokeless tobacco. She reports current alcohol use of about 8.0 standard drinks of alcohol per week. She reports current drug use. Drug: Cocaine.  Family History:  Family History  Problem Relation Age of Onset  . Coronary artery disease Father   . Breast cancer Maternal Aunt 60     Prior to Admission medications   Medication Sig Start Date End Date Taking? Authorizing Provider  acetaminophen (TYLENOL) 500 MG tablet Take 1,000 mg by mouth every 6 (six) hours as needed for pain.   Yes [provider]  albuterol (PROVENTIL HFA;VENTOLIN HFA) 108 (90 BASE) MCG/ACT inhaler Inhale 2 puffs into the lungs every 4 (four) hours as needed for wheezing or shortness of breath.    Yes [provider]  albuterol (PROVENTIL) (2.5 MG/3ML) 0.083% nebulizer solution USE 1 VIAL IN NEBULIZER EVERY 4 HOURS AS NEEDED FOR WHEEZING OR SHORTNESS OF BREATH Patient taking differently: Take 2.5 mg by nebulization every 4 (four) hours as needed for wheezing or shortness of breath.  05/29/18  Yes Tanda Rockers, MD  b complex vitamins tablet Take 1 tablet by mouth 2 (two) times daily. Patient taking differently: Take 1 tablet by mouth daily.  01/28/12  Yes Eugenie Filler, MD  BEVESPI AEROSPHERE 9-4.8 MCG/ACT AERO INHALE 2 PUFFS INTO THE LUNGS 2 TIMES A DAY Patient taking differently: Inhale 2 puffs into the  lungs 2 (two) times daily.  08/29/17  Yes Tanda Rockers, MD  Cholecalciferol (CVS D3) 2000 units CAPS Take 2,000 Units by mouth daily.   Yes [provider]  cyclobenzaprine (FLEXERIL) 5 MG tablet Take 5 mg by mouth every 8 (eight) hours as needed for muscle spasms.  11/22/13  Yes [provider]  folic acid (FOLVITE) 1 MG tablet Take 1 mg by mouth daily.   Yes [provider]  gabapentin (NEURONTIN) 300 MG capsule Take 600 mg by mouth at bedtime.    Yes [provider]  levETIRAcetam (KEPPRA) 500 MG tablet Take 1 tablet (500 mg total) by mouth 2 (two) times daily. 01/28/12  Yes Eugenie Filler, MD  metoprolol succinate (TOPROL XL) 25 MG 24 hr tablet Take 25 mg by mouth daily.  09/30/13  Yes [provider]  Multiple Vitamin (MULITIVITAMIN WITH MINERALS) TABS Take 1 tablet by mouth daily. 06/07/11  Yes Dorian Heckle, MD  naltrexone (DEPADE) 50 MG tablet  Take 50 mg by mouth daily. 06/17/18  Yes [provider]  predniSONE (DELTASONE) 10 MG tablet TAKE 2 TABLETS BY MOUTH DAILY, UNTIL BREATHING BETTER, THEN TAKE ONE TABLET DAILY. Patient taking differently: Take 10 mg by mouth daily.  07/03/18  Yes Tanda Rockers, MD  spironolactone (ALDACTONE) 25 MG tablet Take 25 mg by mouth daily.   Yes [provider]  thiamine 100 MG tablet Take 1 tablet (100 mg total) by mouth daily. 06/07/11  Yes Dorian Heckle, MD    Physical Exam: Vitals:   07/28/18 0045 07/28/18 0115 07/28/18 0230 07/28/18 0244  BP: 140/82   140/72  Pulse: 99 (!) 103 (!) 101 89  Resp: 20 12 15    Temp:      SpO2:  97% 96%    General: Not in acute distress HEENT:       Eyes: PERRL, EOMI, no scleral icterus.       ENT: No discharge from the ears and nose, no pharynx injection, no tonsillar enlargement.        Neck: No JVD, no bruit, no mass felt. Heme: No neck lymph node enlargement. Cardiac: S1/S2, RRR, No murmurs, No gallops or rubs. Respiratory: Very decreased air  movement bilaterally. No rales, wheezing, rhonchi or rubs. GI: Soft, nondistended, nontender, no rebound pain, no organomegaly, BS present. GU: No hematuria Ext: No pitting leg edema bilaterally. 2+DP/PT pulse bilaterally. Musculoskeletal: No joint deformities, No joint redness or warmth, no limitation of ROM in spin. Skin: No rashes.  Neuro: Alert, oriented X3, cranial nerves II-XII grossly intact, moves all extremities normally.   Psych: Patient is not psychotic, no suicidal or hemocidal ideation.  Labs on Admission: I have personally reviewed following labs and imaging studies  CBC: Recent Labs  Lab 07/27/18 2359  WBC 9.8  HGB 15.7*  HCT 48.4*  MCV 98.0  PLT 761*   Basic Metabolic Panel: Recent Labs  Lab 07/27/18 2327  NA 136  K 4.3  CL 98  CO2 29  GLUCOSE 106*  BUN 7  CREATININE 0.71  CALCIUM 8.9  MG 1.8   GFR: CrCl cannot be calculated (Unknown ideal weight.). Liver Function Tests: No results for input(s): AST, ALT, ALKPHOS, BILITOT, PROT, ALBUMIN in the last 168 hours. No results for input(s): LIPASE, AMYLASE in the last 168 hours. No results for input(s): AMMONIA in the last 168 hours. Coagulation Profile: No results for input(s): INR, PROTIME in the last 168 hours. Cardiac Enzymes: No results for input(s): CKTOTAL, CKMB, CKMBINDEX, TROPONINI in the last 168 hours. BNP (last 3 results) No results for input(s): PROBNP in the last 8760 hours. HbA1C: No results for input(s): HGBA1C in the last 72 hours. CBG: No results for input(s): GLUCAP in the last 168 hours. Lipid Profile: No results for input(s): CHOL, HDL, LDLCALC, TRIG, CHOLHDL, LDLDIRECT in the last 72 hours. Thyroid Function Tests: No results for input(s): TSH, T4TOTAL, FREET4, T3FREE, THYROIDAB in the last 72 hours. Anemia Panel: No results for input(s): VITAMINB12, FOLATE, FERRITIN, TIBC, IRON, RETICCTPCT in the last 72 hours. Urine analysis:    Component Value Date/Time   COLORURINE YELLOW  09/29/2012 [place] 09/29/2012 1756   LABSPEC 1.013 09/29/2012 1756   PHURINE 7.0 09/29/2012 1756   GLUCOSEU NEGATIVE 09/29/2012 1756   HGBUR NEGATIVE 09/29/2012 1756   BILIRUBINUR NEGATIVE 09/29/2012 1756   KETONESUR NEGATIVE 09/29/2012 1756   PROTEINUR NEGATIVE 09/29/2012 1756   UROBILINOGEN 1.0 09/29/2012 1756   NITRITE NEGATIVE 09/29/2012 1756  LEUKOCYTESUR NEGATIVE 09/29/2012 1756   Sepsis Labs: @LABRCNTIP (procalcitonin:4,lacticidven:4) )No results found for this or any previous visit (from the past 240 hour(s)).   Radiological Exams on Admission: Dg Chest 2 View  Result Date: 07/27/2018 CLINICAL DATA:  63 year old female with shortness of breath. History of lung cancer. EXAM: CHEST - 2 VIEW COMPARISON:  Chest radiograph dated 02/23/2018 and CT dated 06/25/2016 FINDINGS: There is emphysematous changes of the lungs with flattening of the diaphragms. There is an 11 mm left upper lobe nodule as seen on the prior CT and radiograph. No focal consolidation, pleural effusion, or pneumothorax. The cardiac silhouette is within normal limits. Atherosclerotic calcification of the aortic arch. No acute osseous pathology. Partially visualized right-sided VP shunt. IMPRESSION: 1. No acute cardiopulmonary process. 2. Emphysema. 3. Left upper lobe nodule and seen on the prior studies. Electronically Signed   By: Anner Crete M.D.   On: 07/27/2018 23:36     EKG: Independently reviewed.  Sinus rhythm, QTC 441, anteroseptal infarction pattern.  Assessment/Plan Principal Problem:   Acute on chronic respiratory failure with hypoxia (HCC) Active Problems:   Cigarette smoker   Seizure disorder (HCC)   Chronic diastolic CHF (congestive heart failure) (HCC)   Essential hypertension   COPD exacerbation (HCC)   Polysubstance abuse ([place])   Acute on chronic respiratory failure with hypoxia due to COPD exacerbation: Chest x-ray negative for infiltration.  Patient does not have  wheezing or rhonchi, but has severely decreased air movement bilaterally on lung auscultation.  No fever or chills.  No risk for COVID19.  Patient does not have productive cough, fever or chills.  Will hold off antibiotics today.  -will place on tele bed for obs -Nebulizers: scheduled Atrovent and prn Xopenex Nebs -Magnesium sulfate 2 g given in ED -Solu-Medrol 60 mg IV tid -Mucinex for cough  -Incentive spirometry -will get sputum culture if develops active cough -check Flu pcr -Nasal cannula oxygen as needed to maintain O2 saturation 93% or greater  Polysubstance abuse (cocaine in remission, tobacco and alcohol abuse); -Did counseling about importance of quitting substance abuse -nicotine patch -CiWA protocol -continue home naltrexone  Seizure -Seizure precaution -When necessary Ativan for seizure -Continue Home medications: Keppra  HTN:  -Continue home medications: Metoprolol and spironolactone -IV hydralazine prn  Chronic diastolic congestive heart failure: 2D echo on 04/25/2016 showed EF 60-65% with grade 2 diastolic dysfunction.  Patient does not have leg edema JVD.  CHF is compensated. - Continue spironolactone    DVT ppx: SQ Lovenox Code Status: Full code Family Communication: None at bed side.      Disposition Plan:  Anticipate discharge back to previous home environment Consults called:  none Admission status: Obs / tele    Date of Service 07/28/2018    [facility]   If 7PM-7AM, please contact night-coverage [email or web address] Password [facility] 07/28/2018, 2:55 AM

## 2018-07-28 NOTE — Progress Notes (Signed)
Initial Nutrition Assessment  RD working remotely.  DOCUMENTATION CODES:   Underweight  INTERVENTION:   -Continue Ensure Enlive po BID, each supplement provides 350 kcal and 20 grams of protein -MVI with minerals daily  NUTRITION DIAGNOSIS:   Increased nutrient needs related to chronic illness(COPD) as evidenced by estimated needs.  GOAL:   Patient will meet greater than or equal to 90% of their needs  MONITOR:   PO intake, Supplement acceptance, Labs, Weight trends, Skin, I & O's  REASON FOR ASSESSMENT:   Consult, Malnutrition Screening Tool Assessment of nutrition requirement/status  ASSESSMENT:   Wanda Prince is a 63 y.o. female with medical history significant of stage IV COPD, polysubstance abuse (cocaine abuse in remission, alcohol and tobacco), chronic hepatitis C, and hepatocellular carcinoma (s/p of ablation), RUL lung cancer (s/p of XRT, no chemo surgery), hypertension, depression, seizure, remote intracranial hemorrhage, who presents with shortness of breath.  Pt admitted with COPD exacerbation.  Reviewed I/O's: +50 ml x 24 hours  Spoke with pt by phone, who was pleasant and in good spirits today. Pt reports feeling much better since being admitted to the hospital. She shares that she has had a poor appetite over the past few months; she mostly grazes on food throughout the day and does not eat large meals. Typical intake consists of approximately one box of fruit snacks per day, yogurt, ice cream, and water. Pt shares she also consumes 1-2 Ensure supplements per day (her sister assists her with grocery shopping and supplements).   She shares she was not very hungry this morning, but still consumed about half of her breakfast. However, she did not recall what she ate.   Pt reports her UBW is around 127#, which she last weighed over a year ago. Per wt hx, noted wt stability over the past year, however, wt trends reveal a more distant wt loss (pt confirms this).  She agrees that a lot of her weight loss is likely due to her poor oral intake, as well as cigarette smoking (which she is motivated to quit).   Discussed with pt importance of good meal and supplement intake to promote healing. Provided examples of healthier, high protein snacks that pt could eat throughout the day (such as yogurt, ice cream, and peanut butter crackers) to increase calorie and protein intake). Pt also amenable to continue Ensure supplements during hospitalization and at home.   Suspect pt with malnutrition (given poor oral intake, distant history of weight loss, and underweight status), however, RD unable to identify at this time.   Medications reviewed and include solu-medrol.   Labs reviewed.   NUTRITION - FOCUSED PHYSICAL EXAM:    Most Recent Value  Orbital Region  Unable to assess  Upper Arm Region  Unable to assess  Thoracic and Lumbar Region  Unable to assess  Buccal Region  Unable to assess  Temple Region  Unable to assess  Clavicle Bone Region  Unable to assess  Clavicle and Acromion Bone Region  Unable to assess  Scapular Bone Region  Unable to assess  Dorsal Hand  Unable to assess  Patellar Region  Unable to assess  Anterior Thigh Region  Unable to assess  Posterior Calf Region  Unable to assess  Edema (RD Assessment)  Unable to assess  Hair  Unable to assess  Eyes  Unable to assess  Mouth  Unable to assess  Skin  Unable to assess  Nails  Unable to assess       Diet  Order:   Diet Order            Diet Heart Room service appropriate? Yes; Fluid consistency: Thin  Diet effective now              EDUCATION NEEDS:   Education needs have been addressed  Skin:  Skin Assessment: Reviewed RN Assessment  Last BM:  07/27/18  Height:   Ht Readings from Last 1 Encounters:  07/28/18 5\' 8"  (1.727 m)    Weight:   Wt Readings from Last 1 Encounters:  07/28/18 42 kg    Ideal Body Weight:  63.6 kg  BMI:  Body mass index is 14.09  kg/m.  Estimated Nutritional Needs:   Kcal:  1500-1700  Protein:  70-85 grams  Fluid:  > 1.5 L    Desi Rowe A. Jimmye Norman, RD, LDN, [place] Registered Dietitian II Certified Diabetes Care and Education Specialist Pager: [phone] After hours Pager: [phone]

## 2018-07-28 NOTE — Discharge Instructions (Signed)
[place] Hospital Stay Proper nutrition can help your body recover from illness and injury.   Foods and beverages high in protein, vitamins, and minerals help rebuild muscle loss, promote healing, & reduce fall risk.   In addition to eating healthy foods, a nutrition shake is an easy, delicious way to get the nutrition you need during and after your hospital stay  It is recommended that you continue to drink 2 bottles per day of:       Ensure Plus or equivalent for at least 1 month (30 days) after your hospital stay   Tips for adding a nutrition shake into your routine: As allowed, drink one with vitamins or medications instead of water or juice Enjoy one as a tasty mid-morning or afternoon snack Drink cold or make a milkshake out of it Drink one instead of milk with cereal or snacks Use as a coffee creamer   Available at the following grocery stores and pharmacies:           * [place] [phone]            For COUPONS visit: www.ensure.com/join or [email or web address]   Suggested Substitutions Ensure Plus = Boost Plus = Carnation Breakfast Essentials = Boost Compact Ensure Active Clear = Boost Breeze Glucerna Shake = Boost Glucose Control = Carnation Breakfast Essentials SUGAR FREE

## 2018-07-28 NOTE — Progress Notes (Addendum)
  PROGRESS NOTE  Patient admitted earlier this morning. See H&P. Patient is 63 yo female who presented with SOB. She admits that she could not get air and became very dyspneic.  She denies any fevers or cough.  She was admitted for acute hypoxemic respiratory failure, required nasal cannula O2 and was admitted for COPD exacerbation.  Chest x-ray was negative for infiltration, influenza swab negative.  Patient seen and examined today.  She is resting comfortably in bed, on nasal cannula O2 3 L.  She does not use any nasal cannula O2 at baseline.  Her breath sounds is diminished bilaterally without any wheeze or rhonchi.  She reports that her breathing has improved since admission, but not back to baseline yet.  She admits to daily tobacco use 1 pack/day for decades, denies e-cigarette or vape use.  Continue treatments for COPD including Solu-Medrol, azithromycin, breathing treatments.  Continue to wean nasal cannula O2 as able.   Called and updated sister over the phone.   Dessa Phi, DO [facility] [email or web address] 07/28/2018, 9:31 AM

## 2018-07-28 NOTE — Progress Notes (Signed)
Discussed attempt to walk and check oxygen, pt declines saying she is just tired, pt does get DOE to bathroom, will retry in am

## 2018-07-29 DIAGNOSIS — J9601 Acute respiratory failure with hypoxia: Secondary | ICD-10-CM | POA: Diagnosis not present

## 2018-07-29 DIAGNOSIS — I5032 Chronic diastolic (congestive) heart failure: Secondary | ICD-10-CM | POA: Diagnosis not present

## 2018-07-29 DIAGNOSIS — F17210 Nicotine dependence, cigarettes, uncomplicated: Secondary | ICD-10-CM | POA: Diagnosis not present

## 2018-07-29 DIAGNOSIS — J441 Chronic obstructive pulmonary disease with (acute) exacerbation: Secondary | ICD-10-CM | POA: Diagnosis not present

## 2018-07-29 MED ORDER — GUAIFENESIN ER 600 MG PO TB12: 600.0000 mg | ORAL_TABLET | Freq: Two times a day (BID) | ORAL | 2 refills | Status: AC

## 2018-07-29 MED ORDER — ENSURE ENLIVE PO LIQD: 237.0000 mL | Freq: Two times a day (BID) | ORAL | 12 refills | Status: AC

## 2018-07-29 MED ORDER — PREDNISONE 10 MG PO TABS: ORAL_TABLET | ORAL | 2 refills | Status: DC

## 2018-07-29 MED ORDER — AZITHROMYCIN 250 MG PO TABS: ORAL_TABLET | ORAL | 0 refills | Status: AC

## 2018-07-29 NOTE — Discharge Summary (Signed)
Discharge Summary  Wanda Prince BTD:[id number] DOB: 06-28-1955  PCP: Katherina Mires, MD  Admit date: 07/27/2018 Discharge date: 07/29/2018  Time spent: 52mins  Recommendations for Outpatient Follow-up:  1. F/u with PCP within a week  for hospital discharge follow up, repeat cbc/bmp at follow up 2. F/u with pulmonology for copd management  3. Home oxygen ordered  Discharge Diagnoses:  Active Hospital Problems   Diagnosis Date Noted  . Acute hypoxemic respiratory failure ([place])   . Polysubstance abuse ([place]) 07/28/2018  . COPD exacerbation ([place]) 04/22/2015  . Essential hypertension 01/10/2014  . Seizure disorder ([place]) 01/25/2012  . Chronic diastolic CHF (congestive heart failure) ([place]) 01/25/2012  . Cigarette smoker 06/04/2011    Resolved Hospital Problems  No resolved problems to display.    Discharge Condition: stable  Diet recommendation: heart healthy  Filed Weights   07/28/18 0402 07/29/18 0343  Weight: 42 kg 41.4 kg    History of present illness: (per admitting MD Dr Blaine Hamper) PCP: Katherina Mires, MD   Patient coming from:  The patient is coming from home.  At baseline, pt is independent for most of ADL.        Chief Complaint: SOB  HPI: Wanda Prince is a 63 y.o. female with medical history significant of stage IV COPD, polysubstance abuse (cocaine abuse in remission, alcohol and tobacco), chronic hepatitis C, and hepatocellular carcinoma (s/p of ablation), RUL lung cancer (s/p of XRT, no chemo surgery), hypertension, depression, seizure, remote intracranial hemorrhage, who presents with shortness of breath.  Patient states that she started having worsening shortness of breath tonight, which has been progressively getting worse.  She denies cough, No chest pain, fever or chills.  No sick contact.  No recent long distance traveling.She reports she has been on prednisone taper for the last 2 weeks.  EMS notes the patient had tripoding and accessory muscle use on  arrival. She was given 125 mg of Solu-Medrol in route.  Patient does not have nausea vomiting, diarrhea, abdominal pain, symptoms of UTI or unilateral weakness.   ED Course: pt was found to have WBC 9.8, electrolytes renal function okay, temperature normal, tachycardia, oxygen desaturation to 79% on ambulation, which improved to 97% on 2 L nasal cannula oxygen.  Chest x-ray is negative for infiltration, showed emphysema and old LUL nodules. Pt is placed on telemetry bed of observation.  Hospital Course:  Principal Problem:   Acute hypoxemic respiratory failure (HCC) Active Problems:   Cigarette smoker   Seizure disorder (HCC)   Chronic diastolic CHF (congestive heart failure) (HCC)   Essential hypertension   COPD exacerbation (HCC)   Polysubstance abuse (HCC)   Acute on chronic respiratory failure with hypoxia due to COPD exacerbation:  -Chest x-ray negative for infiltration.  -low risk for COVID 19 , denies sick contact, no fever, no cough  -flu swab negative -she received iv solumedrol,  is feeling better and wants to go home, she agreed to home o2 -she is on oral prednisone daily at baseline prescribed by her pulmonologist Dr Melvyn Novas -she is discharged on slow prednisone taper, z pack, home o2, follow up with Dr wert  Polysubstance abuse (cocaine in remission, tobacco and alcohol abuse); -Did counseling about importance of quitting substance abuse -nicotine patch -CiWA protocol, no signs of alcohol withdrawal in the hospital -continue home naltrexone  Seizure -Seizure precaution -When necessary Ativan for seizure -Continue Home medications: Keppra  HTN:  -Continue home medications: Metoprolol and spironolactone -bp stable on home meds  Chronic diastolic congestive heart failure: 2D echo on 04/25/2016 showed EF 60-65% with grade 2 diastolic dysfunction.  Patient does not have leg edema JVD.  CHF is compensated. - Continue spironolactone    DVT ppx while in the  hospital: SQ Lovenox Code Status: Full code Family Communication: None at bed side.      Disposition Plan:  home Consults called:  none   Discharge Exam: BP 123/81 (BP Location: Left Arm)   Pulse 94   Temp 98 F (36.7 C) (Oral)   Resp 18   Ht 5\' 8"  (1.727 m)   Wt 41.4 kg Comment: scale c  LMP  (LMP Unknown)   SpO2 93%   BMI 13.87 kg/m   General: frail, thin, NAD Cardiovascular: RRR Respiratory: very diminished overall, no wheezing, no rales, no rhonchi  Discharge Instructions You were cared for by a hospitalist during your hospital stay. If you have any questions about your discharge medications or the care you received while you were in the hospital after you are discharged, you can call the unit and asked to speak with the hospitalist on call if the hospitalist that took care of you is not available. Once you are discharged, your primary care physician will handle any further medical issues. Please note that NO REFILLS for any discharge medications will be authorized once you are discharged, as it is imperative that you return to your primary care physician (or establish a relationship with a primary care physician if you do not have one) for your aftercare needs so that they can reassess your need for medications and monitor your lab values.  Discharge Instructions    Diet - low sodium heart healthy   Complete by:  As directed    Carb modified   Increase activity slowly   Complete by:  As directed      Allergies as of 07/29/2018   No Known Allergies     Medication List    TAKE these medications   acetaminophen 500 MG tablet Commonly known as:  TYLENOL Take 1,000 mg by mouth every 6 (six) hours as needed for pain.   albuterol (2.5 MG/3ML) 0.083% nebulizer solution Commonly known as:  PROVENTIL USE 1 VIAL IN NEBULIZER EVERY 4 HOURS AS NEEDED FOR WHEEZING OR SHORTNESS OF BREATH What changed:  See the new instructions.   albuterol 108 (90 Base) MCG/ACT inhaler  Commonly known as:  PROVENTIL HFA;VENTOLIN HFA Inhale 2 puffs into the lungs every 4 (four) hours as needed for wheezing or shortness of breath. What changed:  Another medication with the same name was changed. Make sure you understand how and when to take each.   azithromycin 250 MG tablet Commonly known as:  Zithromax Z-Pak Take 2 tablets (500 mg) on  Day 1,  followed by 1 tablet (250 mg) once daily on Days 2 through 5.   b complex vitamins tablet Take 1 tablet by mouth 2 (two) times daily. What changed:  when to take this   Bevespi Aerosphere 9-4.8 MCG/ACT Aero Generic drug:  Glycopyrrolate-Formoterol INHALE 2 PUFFS INTO THE LUNGS 2 TIMES A DAY What changed:  See the new instructions.   [employer] D3 50 MCG (2000 UT) Caps Generic drug:  Cholecalciferol Take 2,000 Units by mouth daily.   cyclobenzaprine 5 MG tablet Commonly known as:  FLEXERIL Take 5 mg by mouth every 8 (eight) hours as needed for muscle spasms.   feeding supplement (ENSURE ENLIVE) Liqd Take 237 mLs by mouth 2 (two) times daily  between meals.   folic acid 1 MG tablet Commonly known as:  FOLVITE Take 1 mg by mouth daily.   gabapentin 300 MG capsule Commonly known as:  NEURONTIN Take 600 mg by mouth at bedtime.   guaiFENesin 600 MG 12 hr tablet Commonly known as:  Mucinex Take 1 tablet (600 mg total) by mouth 2 (two) times daily.   levETIRAcetam 500 MG tablet Commonly known as:  KEPPRA Take 1 tablet (500 mg total) by mouth 2 (two) times daily.   multivitamin with minerals Tabs tablet Take 1 tablet by mouth daily.   naltrexone 50 MG tablet Commonly known as:  DEPADE Take 50 mg by mouth daily.   predniSONE 10 MG tablet Commonly known as:  DELTASONE Label  & dispense according to the schedule below. 6 Pills PO on day one then, 5 Pills PO on day two, 4 Pills PO on day three, 3Pills PO on day four, 2 Pills PO on day five, THEN TAKE ONE TABLET DAILY. What changed:  additional instructions   spironolactone  25 MG tablet Commonly known as:  ALDACTONE Take 25 mg by mouth daily.   thiamine 100 MG tablet Take 1 tablet (100 mg total) by mouth daily.   Toprol XL 25 MG 24 hr tablet Generic drug:  metoprolol succinate Take 25 mg by mouth daily.            Durable Medical Equipment  (From admission, onward)         Start     Ordered   07/29/18 1011  DME Oxygen  Once    Question Answer Comment  Mode or (Route) Nasal cannula   Liters per Minute 2   Frequency Continuous (stationary and portable oxygen unit needed)   Oxygen conserving device Yes   Oxygen delivery system Gas      07/29/18 1011         No Known Allergies Follow-up Information    Katherina Mires, MD.   Specialty:  Family Medicine Why:  Office will call and set up appointment Contact information: [address] [place] [address] [phone]        Tanda Rockers, MD. Go on 08/03/2018.   Specialty:  Pulmonary Disease Why:  @10 :00am will be Televisit,patient may reschedule Contact information: [address] [place] [address] [phone]            The results of significant diagnostics from this hospitalization (including imaging, microbiology, ancillary and laboratory) are listed below for reference.    Significant Diagnostic Studies: Dg Chest 2 View  Result Date: 07/27/2018 CLINICAL DATA:  63 year old female with shortness of breath. History of lung cancer. EXAM: CHEST - 2 VIEW COMPARISON:  Chest radiograph dated 02/23/2018 and CT dated 06/25/2016 FINDINGS: There is emphysematous changes of the lungs with flattening of the diaphragms. There is an 11 mm left upper lobe nodule as seen on the prior CT and radiograph. No focal consolidation, pleural effusion, or pneumothorax. The cardiac silhouette is within normal limits. Atherosclerotic calcification of the aortic arch. No acute osseous pathology. Partially visualized right-sided VP shunt. IMPRESSION: 1. No acute  cardiopulmonary process. 2. Emphysema. 3. Left upper lobe nodule and seen on the prior studies. Electronically Signed   By: Anner Crete M.D.   On: 07/27/2018 23:36    Microbiology: No results found for this or any previous visit (from the past 240 hour(s)).   Labs: Basic Metabolic Panel: Recent Labs  Lab 07/27/18 2327  NA 136  K 4.3  CL 98  CO2 29  GLUCOSE 106*  BUN 7  CREATININE 0.71  CALCIUM 8.9  MG 1.8   Liver Function Tests: No results for input(s): AST, ALT, ALKPHOS, BILITOT, PROT, ALBUMIN in the last 168 hours. No results for input(s): LIPASE, AMYLASE in the last 168 hours. No results for input(s): AMMONIA in the last 168 hours. CBC: Recent Labs  Lab 07/27/18 2359  WBC 9.8  HGB 15.7*  HCT 48.4*  MCV 98.0  PLT 124*   Cardiac Enzymes: No results for input(s): CKTOTAL, CKMB, CKMBINDEX, TROPONINI in the last 168 hours. BNP: BNP (last 3 results) No results for input(s): BNP in the last 8760 hours.  ProBNP (last 3 results) No results for input(s): PROBNP in the last 8760 hours.  CBG: No results for input(s): GLUCAP in the last 168 hours.     Signed:  Florencia Reasons MD, PhD  [facility] 07/29/2018, 2:13 PM

## 2018-07-29 NOTE — Progress Notes (Signed)
SATURATION QUALIFICATIONS: (This note is used to comply with regulatory documentation for home oxygen)  Patient Saturations on Room Air at Rest = 95%  Patient Saturations on Room Air while Ambulating = 83%  Patient Saturations on 2L Liters of oxygen while Ambulating = 93%  Please briefly explain why patient needs home oxygen: desats while ambulating

## 2018-07-29 NOTE — TOC Transition Note (Signed)
Transition of Care [facility]) - CM/SW Discharge Note   Patient Details  Name: Wanda Prince MRN: [id number] Date of Birth: 1955-08-15  Transition of Care [facility]) CM/SW Contact:  Sherrilyn Rist Phone Number: [phone] 07/29/2018, 3:23 PM   Clinical Narrative:    Patient will be discharging home on home oxygen; Brad with [employer] called and will deliver a portable oxygen tank to the room prior to discharging home and the concentrator will be delivered to the home.    Final next level of care: Home/Self Care Barriers to Discharge: No Barriers Identified   Patient Goals and CMS Choice Patient states their goals for this hospitalization and ongoing recovery are:: to breathe easier CMS Medicare.gov Compare Post Acute Care list provided to:: Patient Choice offered to / list presented to : NA  Discharge Placement                       Discharge Plan and Services In-house Referral: NA Discharge Planning Services: CM Consult Post Acute Care Choice: NA          DME Arranged: N/A DME Agency: NA HH Arranged: NA HH Agency: NA   Social Determinants of Health (SDOH) Interventions     Readmission Risk Interventions No flowsheet data found.

## 2018-07-29 NOTE — TOC Initial Note (Signed)
Transition of Care [facility]) - Initial/Assessment Note    Patient Details  Name: Wanda Prince MRN: [id number] Date of Birth: [birth year]  [facility]) CM/SW Contact:    Sherrilyn Rist Phone Number: [phone] 07/29/2018, 9:49 AM  Clinical Narrative:                 Patient lives at home; PCP: Katherina Mires, MD, has private insurance with Medicaid with prescription drug coverage; [facility] [address]; pt reports no problem with medication; DME - cane and nebulizer at home; no home oxygen at this time. No needs identified. CM will continue to follow for progression of care.  Expected Discharge Plan: Home/Self Care Barriers to Discharge: No Barriers Identified   Patient Goals and CMS Choice Patient states their goals for this hospitalization and ongoing recovery are:: to breathe easier CMS Medicare.gov Compare Post Acute Care list provided to:: Patient Choice offered to / list presented to : NA  Expected Discharge Plan and Services Expected Discharge Plan: Home/Self Care In-house Referral: NA Discharge Planning Services: CM Consult Post Acute Care Choice: NA Living arrangements for the past 2 months: Single Family Home                 DME Arranged: N/A DME Agency: NA HH Arranged: NA HH Agency: NA  Prior Living Arrangements/Services Living arrangements for the past 2 months: Single Family Home Lives with:: Self Patient language and need for interpreter reviewed:: No Do you feel safe going back to the place where you live?: Yes      Need for Family Participation in Patient Care: No (Comment) Care giver support system in place?: Yes (comment)   Criminal Activity/Legal Involvement Pertinent to Current Situation/Hospitalization: No - Comment as needed  Activities of Daily Living Home Assistive Devices/Equipment: Cane (specify quad or straight) ADL Screening (condition at time of admission) Patient's  cognitive ability adequate to safely complete daily activities?: Yes Is the patient deaf or have difficulty hearing?: No Does the patient have difficulty seeing, even when wearing glasses/contacts?: No Does the patient have difficulty concentrating, remembering, or making decisions?: No Patient able to express need for assistance with ADLs?: Yes Does the patient have difficulty dressing or bathing?: No Independently performs ADLs?: Yes (appropriate for developmental age) Does the patient have difficulty walking or climbing stairs?: Yes(SOB) Weakness of Legs: None Weakness of Arms/Hands: None  Permission Sought/Granted Permission sought to share information with : Case Manager Permission granted to share information with : Yes, Verbal Permission Granted  Share Information with NAME: Peggy - sister           Emotional Assessment Appearance:: Developmentally appropriate Attitude/Demeanor/Rapport: Gracious, Engaged Affect (typically observed): Accepting Orientation: : Oriented to Self, Oriented to  Time, Oriented to Place, Oriented to Situation Alcohol / Substance Use: Not Applicable Psych Involvement: No (comment)  Admission diagnosis:  COPD exacerbation ([place]) [J44.1] Patient Active Problem List   Diagnosis Date Noted  . Polysubstance abuse ([place]) 07/28/2018  . Chronic respiratory failure with hypercapnia ([place]) 02/26/2018  . Liver lesion 05/11/2016  . Hepatocellular carcinoma ([place]) 05/10/2016  . 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/01/2016  . DOE (dyspnea on exertion) 04/01/2016  . Preoperative cardiovascular examination 04/01/2016  . Primary cancer of right upper lobe of lung ([place]) 07/12/2015  . COPD  GOLD IV  05/18/2015  . Acute respiratory failure ([place])   . Cavitating mass in right upper lung lobe   .  Liver mass   . Encounter for imaging study to confirm orogastric (OG) tube placement   . Encounter for nasogastric (NG) tube placement   . Acute hypoxemic respiratory  failure ([place])   . COPD exacerbation ([place]) 04/22/2015  . COPD with acute exacerbation ([place]) 01/10/2014  . Acute respiratory failure with hypoxia and hypercapnia ([place]) 01/10/2014  . Essential hypertension 01/10/2014  . Seizure disorder ([place]) 01/25/2012  . Chronic diastolic CHF (congestive heart failure) ([place]) 01/25/2012  . Coronary artery disease, non-occlusive 06/07/2011  . Cigarette smoker 06/04/2011  . Alcohol abuse 06/04/2011  . History of Abscess, brain 06/03/2011  . History of suicide attempt 06/03/2011  . Intracranial hemorrhage ([place]) 06/03/2011   PCP:  Katherina Mires, MD Pharmacy:   [facility], [place] [id number] [place] [address] Phone: [phone] Fax: [place], [place] [address] Phone: [phone] Fax: [phone]  [id number] [place], [place] [place] [address] Phone: [phone] Fax: [phone]     Social Determinants of Health (SDOH) Interventions    Readmission Risk Interventions No flowsheet data found.

## 2018-08-03 ENCOUNTER — Other Ambulatory Visit: Payer: Self-pay

## 2018-08-03 ENCOUNTER — Ambulatory Visit (INDEPENDENT_AMBULATORY_CARE_PROVIDER_SITE_OTHER): Payer: Medicaid Other | Admitting: Internal Medicine

## 2018-08-03 DIAGNOSIS — J449 Chronic obstructive pulmonary disease, unspecified: Secondary | ICD-10-CM

## 2018-08-03 DIAGNOSIS — J9611 Chronic respiratory failure with hypoxia: Secondary | ICD-10-CM

## 2018-08-03 DIAGNOSIS — F17218 Nicotine dependence, cigarettes, with other nicotine-induced disorders: Secondary | ICD-10-CM | POA: Diagnosis not present

## 2018-08-03 DIAGNOSIS — F17210 Nicotine dependence, cigarettes, uncomplicated: Secondary | ICD-10-CM

## 2018-08-03 NOTE — Progress Notes (Signed)
Subjective:  Patient ID: Wanda Prince, female   DOB: 1955-12-09    MRN: [id number]    Brief patient profile:  43 yowf active smoker  with deteriorating sob x weeks > admit   Date of Admission: 04/22/2015   Date of Discharge: 05/07/2015 Attending Physician: Aldine Contes, MD  Discharge Diagnosis: Active Problems:  COPD with acute exacerbation ([place])  COPD exacerbation ([place])  Acute respiratory failure with hypoxemia ([place])  Encounter for imaging study to confirm orogastric (OG) tube placement  Encounter for nasogastric (NG) tube placement  Acute respiratory failure ([place])  Lung mass  Liver mass   05/18/2015 1st [facility] office visit/ Esiquio Boesen  maint rx spiriva/advair but very poor technique  Chief Complaint  Patient presents with  . Pulmonary Consult    Referred by Dr. Suzanna Obey. Pt states that she was dxed with COPD years ago. She c/o increased SOB for the past month. She is SOB just sitting and doing nothing and unable to exert herself much due to SOB.   baseline = used hc parking / sometimes used scooter sometimes not  / now sob at rest  Has flutter not using/ very congested cough but no purulent mucus  Sleeps on 2 pillows ok Very poor insight into meds/ flutter/ purse lip  rec Pantoprazole (protonix) 40 mg   Take  30-60 min before first meal of the day and Pepcid (famotidine)  20 mg one @  bedtime until return to office - this is the best way to tell whether stomach acid is contributing to your problem.   GERD  Diet   Stop advair and spiriva and just take anoro 2 good drags each am / one click  For breathing >  Nebulizer with albuterol every 4 hours if needed (if you can't catch your breath)  For cough > flutter valve as much as you can   02/12/2017  f/u ov/Nate Common re:   GOLD IV/ still smoking/ using saba sev times a day  Chief Complaint  Patient presents with  . Follow-up    SOB with activity, weight loss greater than 15 pounds in 6 months   doe still = MMRC3 =  can't walk 100 yards even at a slow pace at a flat grade s stopping due to sob  s 02/ HT but not wm leaning  Sleeping ok 2 pillows o/w flat  s am congestion   appetite good / eats well but wt loss/ no fever or sweats  rec Work on inhaler technique:  relax and gently blow all the way out then take a nice smooth deep breath back in, triggering the inhaler at same time you start breathing in.  Hold for up to 5 seconds if you can. Blow out thru nose. Rinse and gargle with water when done Plan A = Automatic = Anoro each am  Plan B = Backup Only use your albuterol(Proair=RED   Yellow= Proventil)  as a rescue medication to be used if you can't catch your breath by resting or doing a relaxed purse lip breathing pattern.  - The less you use it, the better it will work when you need it. - Ok to use the inhaler up to 2 puffs  every 4 hours if you must but call for appointment if use goes up over your usual need - Don't leave home without it !!  (think of it like the spare tire for your car)  Plan C = Crisis - only use your albuterol nebulizer if you  first try Plan B   Keep working on cutting down on smoking > The key is to stop smoking completely before smoking completely stops you!                    02/23/2018  f/u ov/Lynae Pederson re: gold iv/ still smoking/ maint rx = bevespi Chief Complaint  Patient presents with  . Follow-up    Breathing has been worse over the past few months. She is using her albuterol inhaler 4 x daily on average and neb about 2 x per wk.    Dyspnea:  50 ft = MMRC4  = sob if tries to leave home or while getting dressed   Cough: min Sleeping: on side horizontal rec Prednisone 10 mg x 2 each am with breakfast until better, then try one a day until return The key is to stop smoking completely before smoking completely stops you!     04/30/2018  f/u ov/Bridgett Hattabaugh re:  GOLD IV / still smoking  On prednisone 10 mg  Chief Complaint  Patient presents with  . Follow-up    Breathing  has been progressively worse. She plans to quit smoking today and is requesting patches.    Dyspnea:  MMRC4  = sob if tries to leave home or while getting dressed  Though some better on prednisone  Vs prior  Cough: no Sleeping: on flat bed/ 2 pillows  SABA use: 3 x daily if active/ not much is stays still  rec Prednisone 10 mg x 2 each am with breakfast until better, then try one a day until return - if worse, go back to 2 each am and start over  The key is to stop smoking completely before smoking completely stops you!    Admit date: 07/27/2018 Discharge date: 07/29/2018   Discharge Diagnoses:      Active Hospital Problems   Diagnosis Date Noted  . Acute hypoxemic respiratory failure ([place])   . Polysubstance abuse ([place]) 07/28/2018  . COPD exacerbation ([place]) 04/22/2015  . Essential hypertension 01/10/2014  . Seizure disorder ([place]) 01/25/2012  . Chronic diastolic CHF (congestive heart failure) ([place]) 01/25/2012  . Cigarette smoker 06/04/2011        Chief Complaint:SOB  MCN:[id number] G Barris a 63 y.o.femalewith medical history significant ofstage IV COPD,polysubstance abuse (cocaine abuse in remission, alcohol and tobacco),chronic hepatitis C, and hepatocellular carcinoma(s/p of ablation), RUL lung cancer (s/p of XRT, nochemo surgery), hypertension, depression, seizure, remote intracranial hemorrhage,who presents with shortness of breath.  Patient states that she started having worsening shortness of breath   which has been progressively getting worse. She denies cough, Nochest pain, fever or chills. No sick contact. No recent long distance traveling.She reports she has been on prednisone taper for the last 2 weeks. EMS notes the patient had tripoding and accessory muscle use on arrival. She was given 125 mg of Solu-Medrol in route. Patient does not have nausea vomiting, diarrhea, abdominal pain, symptoms of UTI or unilateral weakness.   ED Course:pt was found to  have WBC 9.8, electrolytes renal function okay, temperature normal, tachycardia, oxygen desaturation to 79% on ambulation, which improved to 97% on 2 L nasal cannula oxygen. Chest x-ray is negative for infiltration, showed emphysema and old LULnodules.Pt isplaced on telemetry bed of observation.  Hospital Course:  Principal Problem:   Acute hypoxemic respiratory failure (HCC) Active Problems:   Cigarette smoker   Seizure disorder (HCC)   Chronic diastolic CHF (congestive heart failure) ([place])   Essential hypertension  COPD exacerbation (HCC)   Polysubstance abuse ([place])   Acute on chronic respiratory failure with hypoxiadue toCOPD exacerbation: -Chest x-ray negative for infiltration.  -low risk for COVID 19 , denies sick contact, no fever, no cough -flu swab negative -she received iv solumedrol,  is feeling better and wants to go home, she agreed to home o2 -she is on oral prednisone daily at baseline prescribed by her pulmonologist Dr Melvyn Novas -she is discharged on slow prednisone taper, z pack, home o2, follow up with Dr Kailana Benninger  Polysubstance abuse(cocaine in remission, tobacco and alcohol abuse); -Did counseling about importance of quitting substance abuse -nicotine patch -CiWA protocol, no signs of alcohol withdrawal in the hospital -continuehome naltrexone  Seizure -Seizure precaution -When necessary Ativan for seizure -Continue Home medications:Keppra  HTN:  -Continue home medications:Metoprolol and spironolactone -bp stable on home meds  Chronic diastolic congestive heart failure: 2D echo on 04/25/2016 showed EF 60-65% with grade 2 diastolic dysfunction. Patient does not have leg edema JVD. CHF is compensated. - Continue spironolactone    Virtual Visit via Telephone Note 08/03/2018/post hosp f/u - confused with contingencies/ prns  I connected with Wanda Prince on 08/03/18 at 10:00 AM EDT by telephone and verified that I am speaking with the correct  person using two identifiers.   I discussed the limitations, risks, security and privacy concerns of performing an evaluation and management service by telephone and the availability of in person appointments. I also discussed with the patient that there may be a patient responsible charge related to this service. The patient expressed understanding and agreed to proceed.   History of Present Illness: Dyspnea:  On 2lpm walking around appt / still actively smoking  Cough: none Sleeping: flat bed  2 pillows SABA use: proair 3 x daily /  Does not use neb / did not activate action plan prior to going to ER  02: 2 lpm 24/7, not monitoring sats     No obvious day to day or daytime variability or assoc excess/ purulent sputum or mucus plugs or hemoptysis or cp or chest tightness, subjective wheeze or overt sinus or hb symptoms.    Also denies any obvious fluctuation of symptoms with weather or environmental changes or other aggravating or alleviating factors except as outlined above.   Meds reviewed/ med reconciliation completed         Observations/Objective: Good voice texture/ speaking in full sentences    Assessment and Plan: See problem list for active a/p's   Follow Up Instructions: See avs for instructions unique to this ov which includes revised/ updated med list     I discussed the assessment and treatment plan with the patient. The patient was provided an opportunity to ask questions and all were answered. The patient agreed with the plan and demonstrated an understanding of the instructions.   The patient was advised to call back or seek an in-person evaluation if the symptoms worsen or if the condition fails to improve as anticipated.  I provided 30  minutes of non-face-to-face time during this encounter.   Christinia Gully, MD

## 2018-08-03 NOTE — Patient Instructions (Addendum)
Goal for 02 delivery is to keep it above 90% by adjusting the flow up down or off as long as above 90%   Prednisone 10 mg 2 daily until better then 1 daily as maintenance therapy, if get worse, go back to 2 daily  Plan A = Automatic = Bevespi Take 2 puffs first thing in am and then another 2 puffs about 12 hours later and prednisone 10mg  daily   Plan B = Backup Only use your albuterol inhaler as a rescue medication to be used if you can't catch your breath by resting or doing a relaxed purse lip breathing pattern.  - The less you use it, the better it will work when you need it. - Ok to use the inhaler up to 2 puffs  every 4 hours if you must but call for appointment if use goes up over your usual need - Don't leave home without it !!  (think of it like the spare tire for your car)   Plan C = Crisis - only use your albuterol nebulizer if you first try Plan B and it fails to help > ok to use the nebulizer up to every 4 hours but if start needing it regularly call for immediate appointment   Plan D = Deltasone 10mg  - if getting worse and having to use more albuterol, add extra dose of prednisone each  am   Plan E = ER - go to ER or call 911 if all else fails     The key is to stop smoking completely before smoking completely stops you! -For smoking cessation  Info call  [phone]    Please schedule a follow up televisit in 4 weeks, call sooner if needed

## 2018-08-04 ENCOUNTER — Encounter: Payer: Self-pay | Admitting: Internal Medicine

## 2018-08-04 DIAGNOSIS — J9611 Chronic respiratory failure with hypoxia: Secondary | ICD-10-CM | POA: Insufficient documentation

## 2018-08-04 NOTE — Assessment & Plan Note (Signed)
Counseled re importance of smoking cessation but did not meet time criteria for separate billing     Each maintenance medication was reviewed in detail including most importantly the difference between maintenance and as needed and under what circumstances the prns are to be used.  Please see AVS for specific  Instructions which are unique to this visit and I personally typed out  which were reviewed in detail in over the phone and pt mailed a copy.

## 2018-08-04 NOTE — Assessment & Plan Note (Signed)
Active smoker  - trial of Anoro  05/18/15 > better doe  06/01/2015  - Spirometry 06/01/2015  FEV1 0.54 (19%)  Ratio 39   - PFT's  07/24/2015  FEV1 0.65 (21 % ) ratio 29  p no % improvement from saba p anoro am prior to study with DLCO  12 % corrects to 23 % for alv volume   - 06/17/17 changed to bevespi due insurance - prednisone 20 ub then 10 maint added to rx 02/23/2018  04/30/2018  After extensive coaching inhaler device,  effectiveness =  75% with Bevespi (short Ti )     DDX of  difficult airways management almost all start with A and  include Adherence, Ace Inhibitors, Acid Reflux, Active Sinus Disease, Alpha 1 Antitripsin deficiency, Anxiety masquerading as Airways dz,  ABPA,  Allergy(esp in young), Aspiration (esp in elderly), Adverse effects of meds,  Active smoking or vaping, A bunch of PE's (a small clot burden can't cause this syndrome unless there is already severe underlying pulm or vascular dz with poor reserve) plus two Bs  = Bronchiectasis and Beta blocker use..and one C= CHF    Adherence is always the initial "prime suspect" and is a multilayered concern that requires a "trust but verify" approach in every patient - starting with knowing how to use medications, especially inhalers, correctly, keeping up with refills and understanding the fundamental difference between maintenance and prns vs those medications only taken for a very short course and then stopped and not refilled.  - advised to return with all meds in hand using a trust but verify approach to confirm accurate Medication  Reconciliation The principal here is that until we are certain that the  patients are doing what we've asked, it makes no sense to ask them to do more.   Active smoking also at top of the list, see sep a/p  ? Allergy/asthma component ? Steroid resp/dependent. The goal with a chronic steroid dependent illness is always arriving at the lowest effective dose that controls the disease/symptoms and not accepting a  set "formula" which is based on statistics or guidelines that don't always take into account patient  variability or the natural hx of the dz in every individual patient, which may well vary over time.  For now therefore I recommend the patient maintain  20 mg ceiling and 10 mg floor for now

## 2018-08-04 NOTE — Assessment & Plan Note (Addendum)
2lpm since d/c 07/29/2018 for aecopd   Appears  To have Adequate control on present rx, reviewed in detail with pt > no change in rx needed     Advised of goals of care (keeping sats > 90%) and dangers of smoking around 02

## 2018-08-25 ENCOUNTER — Other Ambulatory Visit: Payer: Self-pay | Admitting: Internal Medicine

## 2018-09-02 ENCOUNTER — Ambulatory Visit (INDEPENDENT_AMBULATORY_CARE_PROVIDER_SITE_OTHER): Payer: Medicaid Other | Admitting: Internal Medicine

## 2018-09-02 ENCOUNTER — Other Ambulatory Visit: Payer: Self-pay

## 2018-09-02 ENCOUNTER — Encounter: Payer: Self-pay | Admitting: Internal Medicine

## 2018-09-02 DIAGNOSIS — F17210 Nicotine dependence, cigarettes, uncomplicated: Secondary | ICD-10-CM

## 2018-09-02 DIAGNOSIS — J9611 Chronic respiratory failure with hypoxia: Secondary | ICD-10-CM

## 2018-09-02 DIAGNOSIS — J449 Chronic obstructive pulmonary disease, unspecified: Secondary | ICD-10-CM

## 2018-09-02 NOTE — Progress Notes (Signed)
Subjective:  Patient ID: Wanda Prince, female   DOB: 1955/10/07    MRN: [id number]    Brief patient profile:  63 yowf active smoker  with deteriorating sob x weeks > admit   Date of Admission: 04/22/2015   Date of Discharge: 05/07/2015 Attending Physician: Aldine Contes, MD  Discharge Diagnosis: Active Problems:  COPD with acute exacerbation ([place])  COPD exacerbation ([place])  Acute respiratory failure with hypoxemia ([place])  Encounter for imaging study to confirm orogastric (OG) tube placement  Encounter for nasogastric (NG) tube placement  Acute respiratory failure ([place])  Lung mass  Liver mass   05/18/2015 1st [facility] office visit/ Wanda Prince  maint rx spiriva/advair but very poor technique  Chief Complaint  Patient presents with  . Pulmonary Consult    Referred by Wanda. Suzanna Obey. Pt states that she was dxed with COPD years ago. She c/o increased SOB for the past month. She is SOB just sitting and doing nothing and unable to exert herself much due to SOB.   baseline = used [employer]c parking / sometimes used scooter sometimes not  / now sob at rest  Has flutter not using/ very congested cough but no purulent mucus  Sleeps on 2 pillows ok Very poor insight into meds/ flutter/ purse lip  rec Pantoprazole (protonix) 40 mg   Take  30-60 min before first meal of the day and Pepcid (famotidine)  20 mg one @  bedtime until return to office - this is the best way to tell whether stomach acid is contributing to your problem.   GERD  Diet   Stop advair and spiriva and just take anoro 2 good drags each am / one click  For breathing >  Nebulizer with albuterol every 4 hours if needed (if you can't catch your breath)  For cough > flutter valve as much as you can   02/12/2017  f/u ov/Wanda Prince re:   GOLD IV/ still smoking/ using saba sev times a day  Chief Complaint  Patient presents with  . Follow-up    SOB with activity, weight loss greater than 15 pounds in 6 months   doe still = MMRC3  = can't walk 100 yards even at a slow pace at a flat grade s stopping due to sob  s 02/ HT but not wm leaning  Sleeping ok 2 pillows o/w flat  s am congestion   appetite good / eats well but wt loss/ no fever or sweats  rec Work on inhaler technique:  relax and gently blow all the way out then take a nice smooth deep breath back in, triggering the inhaler at same time you start breathing in.  Hold for up to 5 seconds if you can. Blow out thru nose. Rinse and gargle with water when done Plan A = Automatic = Anoro each am  Plan B = Backup Only use your albuterol(Proair=RED   Yellow= Proventil)  as a rescue medication to be used if you can't catch your breath by resting or doing a relaxed purse lip breathing pattern.  - The less you use it, the better it will work when you need it. - Ok to use the inhaler up to 2 puffs  every 4 hours if you must but call for appointment if use goes up over your usual need - Don't leave home without it !!  (think of it like the spare tire for your car)  Plan C = Crisis - only use your albuterol nebulizer if you  first try Plan B   Keep working on cutting down on smoking > The key is to stop smoking completely before smoking completely stops you!                    02/23/2018  f/u ov/Wanda Prince re: gold iv/ still smoking/ maint rx = bevespi Chief Complaint  Patient presents with  . Follow-up    Breathing has been worse over the past few months. She is using her albuterol inhaler 4 x daily on average and neb about 2 x per wk.    Dyspnea:  50 ft = MMRC4  = sob if tries to leave home or while getting dressed   Cough: min Sleeping: on side horizontal rec Prednisone 10 mg x 2 each am with breakfast until better, then try one a day until return The key is to stop smoking completely before smoking completely stops you!     04/30/2018  f/u ov/Wanda Prince re:  GOLD IV / still smoking  On prednisone 10 mg  Chief Complaint  Patient presents with  . Follow-up     Breathing has been progressively worse. She plans to quit smoking today and is requesting patches.    Dyspnea:  MMRC4  = sob if tries to leave home or while getting dressed  Though some better on prednisone  Vs prior  Cough: no Sleeping: on flat bed/ 2 pillows  SABA use: 3 x daily if active/ not much is stays still  rec Prednisone 10 mg x 2 each am with breakfast until better, then try one a day until return - if worse, go back to 2 each am and start over  The key is to stop smoking completely before smoking completely stops you!    Admit date: 07/27/2018 Discharge date: 07/29/2018   Discharge Diagnoses:      Active Hospital Problems   Diagnosis Date Noted  . Acute hypoxemic respiratory failure ([place])   . Polysubstance abuse ([place]) 07/28/2018  . COPD exacerbation ([place]) 04/22/2015  . Essential hypertension 01/10/2014  . Seizure disorder ([place]) 01/25/2012  . Chronic diastolic CHF (congestive heart failure) ([place]) 01/25/2012  . Cigarette smoker 06/04/2011        Chief Complaint:SOB  CZY:Wanda Prince a 63 y.o.femalewith medical history significant ofstage IV COPD,polysubstance abuse (cocaine abuse in remission, alcohol and tobacco),chronic hepatitis C, and hepatocellular carcinoma(s/p of ablation), RUL lung cancer (s/p of XRT, nochemo surgery), hypertension, depression, seizure, remote intracranial hemorrhage,who presents with shortness of breath.  Patient states that she started having worsening shortness of breath   which has been progressively getting worse. She denies cough, Nochest pain, fever or chills. No sick contact. No recent long distance traveling.She reports she has been on prednisone taper for the last 2 weeks. EMS notes the patient had tripoding and accessory muscle use on arrival. She was given 125 mg of Solu-Medrol in route. Patient does not have nausea vomiting, diarrhea, abdominal pain, symptoms of UTI or unilateral weakness.   ED Course:pt was  found to have WBC 9.8, electrolytes renal function okay, temperature normal, tachycardia, oxygen desaturation to 79% on ambulation, which improved to 97% on 2 L nasal cannula oxygen. Chest x-ray is negative for infiltration, showed emphysema and old LULnodules.Pt isplaced on telemetry bed of observation.  Hospital Course:  Principal Problem:   Acute hypoxemic respiratory failure (HCC) Active Problems:   Cigarette smoker   Seizure disorder (HCC)   Chronic diastolic CHF (congestive heart failure) ([place])   Essential hypertension  COPD exacerbation (HCC)   Polysubstance abuse ([place])   Acute on chronic respiratory failure with hypoxiadue toCOPD exacerbation: -Chest x-ray negative for infiltration.  -low risk for COVID 19 , denies sick contact, no fever, no cough -flu swab negative -she received iv solumedrol,  is feeling better and wants to go home, she agreed to home o2 -she is on oral prednisone daily at baseline prescribed by her pulmonologist Wanda Prince -she is discharged on slow prednisone taper, z pack, home o2, follow up with Wanda Prince  Polysubstance abuse(cocaine in remission, tobacco and alcohol abuse); -Did counseling about importance of quitting substance abuse -nicotine patch -CiWA protocol, no signs of alcohol withdrawal in the hospital -continuehome naltrexone  Seizure -Seizure precaution -When necessary Ativan for seizure -Continue Home medications:Keppra  HTN:  -Continue home medications:Metoprolol and spironolactone -bp stable on home meds  Chronic diastolic congestive heart failure: 2D echo on 04/25/2016 showed EF 60-65% with grade 2 diastolic dysfunction. Patient does not have leg edema JVD. CHF is compensated. - Continue spironolactone    Virtual Visit via Telephone Note 08/03/2018/post hosp f/u - confused with contingencies/ prns  History of Present Illness: Dyspnea:  On 2lpm walking around appt / still actively smoking  Cough:  none Sleeping: flat bed  2 pillows SABA use: proair 3 x daily /  Does not use neb / did not activate action plan prior to going to ER  02: 2 lpm 24/7, not monitoring sats   rec Goal for 02 delivery is to keep it above 90% by adjusting the flow up down or off as long as above 90%  Prednisone 10 mg 2 daily until better then 1 daily as maintenance therapy, if get worse, go back to 2 daily Plan A = Automatic = Bevespi Take 2 puffs first thing in am and then another 2 puffs about 12 hours later and prednisone 10mg  daily  Plan B = Backup Only use your albuterol inhaler as a rescue medication Plan C = Crisis - only use your albuterol nebulizer if you first try Plan B  Plan D = Deltasone 10mg  - if getting worse and having to use more albuterol, add extra dose of prednisone each  am  The key is to stop smoking completely before smoking completely stops you! -For smoking cessation  Info call  [phone]    Virtual Visit via Telephone Note 09/02/2018   I connected with Wanda Prince on 09/02/18 at  2:30 PM EDT by telephone and verified that I am speaking with the correct person using two identifiers.   I discussed the limitations, risks, security and privacy concerns of performing an evaluation and management service by telephone and the availability of in person appointments. I also discussed with the patient that there may be a patient responsible charge related to this service. The patient expressed understanding and agreed to proceed.    History of Present Illness:  Severe copd / 2 cigs per day  Dyspnea:  Walking to get paper every day about half way to street  Cough: none Sleeping: flat bed/ on back ok 2 pillows  SABA use: up to 2x daily  02: 2lpm   No obvious day to day or daytime variability or assoc excess/ purulent sputum or mucus plugs or hemoptysis or cp or chest tightness, subjective wheeze or overt sinus or hb symptoms.    Also denies any obvious fluctuation of symptoms with  weather or environmental changes or other aggravating or alleviating factors except as outlined above.  Meds reviewed/ med reconciliation completed        Observations/Objective: Clear phonation, speaking if full phrases, positive outlook    Assessment and Plan: See problem list for active a/p's   Follow Up Instructions: See avs for instructions unique to this ov which includes revised/ updated med list     I discussed the assessment and treatment plan with the patient. The patient was provided an opportunity to ask questions and all were answered. The patient agreed with the plan and demonstrated an understanding of the instructions.   The patient was advised to call back or seek an in-person evaluation if the symptoms worsen or if the condition fails to improve as anticipated.  I provided 15  minutes of non-face-to-face time during this encounter.   Christinia Gully, MD

## 2018-09-02 NOTE — Assessment & Plan Note (Signed)
Active smoker  - trial of Anoro  05/18/15 > better doe  06/01/2015  - Spirometry 06/01/2015  FEV1 0.54 (19%)  Ratio 39   - PFT's  07/24/2015  FEV1 0.65 (21 % ) ratio 29  p no % improvement from saba p anoro am prior to study with DLCO  12 % corrects to 23 % for alv volume   - 06/17/17 changed to bevespi due insurance - prednisone 20 ub then 10 maint added to rx 02/23/2018  04/30/2018  After extensive coaching inhaler device,  effectiveness =  75% with Bevespi (short Ti ) - reset floor for prednisone at 5 mg daily 09/02/2018      The goal with a chronic steroid dependent illness is always arriving at the lowest effective dose that controls the disease/symptoms and not accepting a set "formula" which is based on statistics or guidelines that don't always take into account patient  variability or the natural hx of the dz in every individual patient, which may well vary over time.  For now therefore I recommend the patient maintain  Ceiling of 20/ floor of 5 mg daily

## 2018-09-02 NOTE — Assessment & Plan Note (Signed)
Down to 2 cigs per day, encouraged to follow thur on complete cessation

## 2018-09-02 NOTE — Patient Instructions (Addendum)
We will send a paper copy of these recs  Prednisone ceiling is 20 mg per day if doing poorly, taper to 5 mg daily (one half of a ten) if doing great.   Please schedule a follow up office visit in 6 weeks, call sooner if needed with all medications /inhalers/ solutions in hand so we can verify exactly what you are taking. This includes all medications from all doctors and over the [place] separate them into two bags:  the ones you take automatically, no matter what, vs the ones you take just when you feel you need them "BAG #2 is UP TO YOU"  - this will really help [place] help you take your medications more effectively.

## 2018-09-02 NOTE — Assessment & Plan Note (Signed)
2lpm since d/c 07/29/2018 for aecopd   Adequate control on present rx, reviewed in detail with pt > no change in rx needed  = 2lpm 24/7

## 2018-09-25 ENCOUNTER — Emergency Department (HOSPITAL_COMMUNITY)
Admission: EM | Admit: 2018-09-25 | Discharge: 2018-09-25 | Disposition: A | Discharge status: Home / Self Care | Payer: Medicaid Other | Attending: Emergency Medicine | Admitting: Emergency Medicine

## 2018-09-25 ENCOUNTER — Other Ambulatory Visit: Payer: Self-pay

## 2018-09-25 ENCOUNTER — Emergency Department (HOSPITAL_COMMUNITY): Payer: Medicaid Other

## 2018-09-25 DIAGNOSIS — R0602 Shortness of breath: Secondary | ICD-10-CM

## 2018-09-25 DIAGNOSIS — F17210 Nicotine dependence, cigarettes, uncomplicated: Secondary | ICD-10-CM | POA: Diagnosis not present

## 2018-09-25 DIAGNOSIS — Z79899 Other long term (current) drug therapy: Secondary | ICD-10-CM | POA: Insufficient documentation

## 2018-09-25 DIAGNOSIS — J441 Chronic obstructive pulmonary disease with (acute) exacerbation: Secondary | ICD-10-CM | POA: Insufficient documentation

## 2018-09-25 DIAGNOSIS — I5032 Chronic diastolic (congestive) heart failure: Secondary | ICD-10-CM | POA: Insufficient documentation

## 2018-09-25 DIAGNOSIS — I259 Chronic ischemic heart disease, unspecified: Secondary | ICD-10-CM | POA: Insufficient documentation

## 2018-09-25 DIAGNOSIS — Z20828 Contact with and (suspected) exposure to other viral communicable diseases: Secondary | ICD-10-CM | POA: Diagnosis not present

## 2018-09-25 DIAGNOSIS — I110 Hypertensive heart disease with heart failure: Secondary | ICD-10-CM | POA: Insufficient documentation

## 2018-09-25 LAB — BLOOD GAS, ARTERIAL
Acid-Base Excess: 8.2 mmol/L — ABNORMAL HIGH (ref 0.0–2.0)
Bicarbonate: 34.2 mmol/L — ABNORMAL HIGH (ref 20.0–28.0)
Drawn by: 295031
O2 Content: 2 L/min
O2 Saturation: 96.3 %
Patient temperature: 98.6
pCO2 arterial: 54 mmHg — ABNORMAL HIGH (ref 32.0–48.0)
pH, Arterial: 7.418 (ref 7.350–7.450)
pO2, Arterial: 85 mmHg (ref 83.0–108.0)

## 2018-09-25 LAB — CBC WITH DIFFERENTIAL/PLATELET
Abs Immature Granulocytes: 0.03 10*3/uL (ref 0.00–0.07)
Basophils Absolute: 0 10*3/uL (ref 0.0–0.1)
Basophils Relative: 0 %
Eosinophils Absolute: 0.2 10*3/uL (ref 0.0–0.5)
Eosinophils Relative: 2 %
HCT: 44.4 % (ref 36.0–46.0)
Hemoglobin: 14.4 g/dL (ref 12.0–15.0)
Immature Granulocytes: 0 %
Lymphocytes Relative: 17 %
Lymphs Abs: 1.3 10*3/uL (ref 0.7–4.0)
MCH: 32.6 pg (ref 26.0–34.0)
MCHC: 32.4 g/dL (ref 30.0–36.0)
MCV: 100.5 fL — ABNORMAL HIGH (ref 80.0–100.0)
Monocytes Absolute: 0.4 10*3/uL (ref 0.1–1.0)
Monocytes Relative: 5 %
Neutro Abs: 5.9 10*3/uL (ref 1.7–7.7)
Neutrophils Relative %: 76 %
Platelets: 109 10*3/uL — ABNORMAL LOW (ref 150–400)
RBC: 4.42 MIL/uL (ref 3.87–5.11)
RDW: 13.3 % (ref 11.5–15.5)
WBC: 7.8 10*3/uL (ref 4.0–10.5)
nRBC: 0 % (ref 0.0–0.2)

## 2018-09-25 LAB — BASIC METABOLIC PANEL
Anion gap: 9 (ref 5–15)
BUN: 15 mg/dL (ref 6–20)
CO2: 32 mmol/L (ref 22–32)
Calcium: 8.6 mg/dL — ABNORMAL LOW (ref 8.9–10.3)
Chloride: 99 mmol/L (ref 98–111)
Creatinine, Ser: 0.62 mg/dL (ref 0.44–1.00)
GFR calc Af Amer: >60 mL/min (ref >60)
GFR calc non Af Amer: >60 mL/min (ref >60)
Glucose, Bld: 108 mg/dL — ABNORMAL HIGH (ref 70–99)
Potassium: 4 mmol/L (ref 3.5–5.1)
Sodium: 140 mmol/L (ref 135–145)

## 2018-09-25 LAB — SARS CORONAVIRUS 2 BY RT PCR (HOSPITAL ORDER, PERFORMED IN ~~LOC~~ HOSPITAL LAB): SARS Coronavirus 2: NEGATIVE

## 2018-09-25 MED ORDER — ALBUTEROL SULFATE HFA 108 (90 BASE) MCG/ACT IN AERS
2.0000 | INHALATION_SPRAY | Freq: Once | RESPIRATORY_TRACT | Status: AC
  Administered 2018-09-25: 10:00:00 2 via RESPIRATORY_TRACT

## 2018-09-25 MED ORDER — PREDNISONE 20 MG PO TABS
40.0000 mg | ORAL_TABLET | Freq: Once | ORAL | Status: AC
  Administered 2018-09-25: 07:00:00 40 mg via ORAL
  Filled 2018-09-25: qty 2

## 2018-09-25 MED ORDER — IPRATROPIUM BROMIDE HFA 17 MCG/ACT IN AERS
2.0000 | INHALATION_SPRAY | Freq: Once | RESPIRATORY_TRACT | Status: AC
  Administered 2018-09-25: 2 via RESPIRATORY_TRACT
  Filled 2018-09-25: qty 12.9

## 2018-09-25 MED ORDER — ALBUTEROL SULFATE HFA 108 (90 BASE) MCG/ACT IN AERS
6.0000 | INHALATION_SPRAY | Freq: Once | RESPIRATORY_TRACT | Status: AC
  Administered 2018-09-25: 6 via RESPIRATORY_TRACT

## 2018-09-25 MED ORDER — NICOTINE 14 MG/24HR TD PT24
14.0000 mg | MEDICATED_PATCH | Freq: Once | TRANSDERMAL | Status: DC
  Administered 2018-09-25: 14 mg via TRANSDERMAL
  Filled 2018-09-25: qty 1

## 2018-09-25 MED ORDER — IPRATROPIUM BROMIDE HFA 17 MCG/ACT IN AERS
2.0000 | INHALATION_SPRAY | Freq: Once | RESPIRATORY_TRACT | Status: AC
  Administered 2018-09-25: 2 via RESPIRATORY_TRACT

## 2018-09-25 MED ORDER — ALBUTEROL SULFATE HFA 108 (90 BASE) MCG/ACT IN AERS
6.0000 | INHALATION_SPRAY | Freq: Once | RESPIRATORY_TRACT | Status: AC
  Administered 2018-09-25: 6 via RESPIRATORY_TRACT
  Filled 2018-09-25: qty 6.7

## 2018-09-25 NOTE — ED Notes (Signed)
Called sister, home phone, informed her patient is waiting in front of ED for pick up

## 2018-09-25 NOTE — ED Provider Notes (Signed)
Patient sent to me by Dr. Leonette Monarch.  Patient feels better after breathing treatments here.  Chest x-ray without acute findings.  Labs are reassuring.  She is COVID negative.  Patient offered admission but would like to go home.  States that she has prednisone there and I will instruct her on how to take this.  Return precautions given   Lacretia Leigh, MD 09/25/18 [phone]

## 2018-09-25 NOTE — Discharge Instructions (Addendum)
Take 60 mg of prednisone a day for the next 4 days.  Return here for any trouble breathing

## 2018-09-25 NOTE — ED Notes (Signed)
Bed: AF58 Expected date:  Expected time:  Means of arrival:  Comments: 63 yr old SOB, COPD

## 2018-09-25 NOTE — ED Notes (Signed)
Pt resting in bed. Medications given per MAR. NAD

## 2018-09-25 NOTE — ED Triage Notes (Signed)
Pt arrived via EMS with complaints of SOB that started 1 hour ago. Hx of COPD with home 02 use. 90% on RA, 100% on 2L [place]. Denies, CP, N/V/D. Pt at baseline mental status

## 2018-09-25 NOTE — ED Provider Notes (Signed)
[facility] DEPT Provider Note  CSN: [id number] Arrival date & time: 09/25/18 0536  Chief Complaint(s) Shortness of Breath  HPI Wanda Prince is a 63 y.o. female with a history of COPD on 2 L nasal cannula at home who presents to the emergency department with 1 day of gradually worsening shortness of breath.  Patient denies any recent fevers or infections.  No coughing or congestion.  No increased sputum.  She endorses chest tightness with this shortness of breath.  Reports that she has a oxygen compressor at home with a long tubing.  She reports that after calling EMS and being placed on the oxygen, her shortness of breath significantly improved.  She was not given any breathing treatments or steroids in route.  Currently she denies any shortness of breath or chest pain.  Denies any nausea vomiting.  No abdominal pain.  No other physical complaints.  HPI  Past Medical History Past Medical History:  Diagnosis Date  . 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 COPD (chronic obstructive pulmonary disease) ([place])   . H/O tracheostomy 04/22/2015   respiratory destress   . History of acute alcoholic hepatitis   . History of alcohol abuse   . History of cocaine abuse ([place])   . History of iron deficiency   . Hx of ventricular shunt 2009  . Hypertension   . Left breast mass   . Major depressive disorder    History of  . Meningitis 2009  . Primary cancer of right upper lobe of lung ([place]) 07/12/2015  . Psychiatric diagnosis    Multiple  . Respiratory arrest ([place])    secondary to Subependymal abscess  . Seizure disorder ([place])   . Seizures ([place])   . Suicide attempt [facility])    History of suicide attempts August 2006 and July 2006   Patient Active Problem List   Diagnosis Date Noted  . Chronic respiratory failure with hypoxia ([place])  08/04/2018  . Polysubstance abuse ([place]) 07/28/2018  . Chronic respiratory failure with hypercapnia ([place]) 02/26/2018  . Liver lesion 05/11/2016  . Hepatocellular carcinoma ([place]) 05/10/2016  . 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/01/2016  . DOE (dyspnea on exertion) 04/01/2016  . Preoperative cardiovascular examination 04/01/2016  . Primary cancer of right upper lobe of lung ([place]) 07/12/2015  . COPD  GOLD IV  05/18/2015  . Acute respiratory failure ([place])   . Cavitating mass in right upper lung lobe   . Liver mass   . Encounter for imaging study to confirm orogastric (OG) tube placement   . Encounter for nasogastric (NG) tube placement   . Acute hypoxemic respiratory failure ([place])   . COPD exacerbation ([place]) 04/22/2015  . COPD with acute exacerbation ([place]) 01/10/2014  . Acute respiratory failure with hypoxia and hypercapnia ([place]) 01/10/2014  . Essential hypertension 01/10/2014  . Seizure disorder ([place]) 01/25/2012  . Chronic diastolic CHF (congestive heart failure) ([place]) 01/25/2012  . Coronary artery disease, non-occlusive 06/07/2011  . Cigarette smoker 06/04/2011  . Alcohol abuse 06/04/2011  . History of Abscess, brain 06/03/2011  . History of suicide attempt 06/03/2011  . Intracranial hemorrhage ([place]) 06/03/2011   Home Medication(s) Prior to Admission medications   Medication Sig Start Date End Date Taking? Authorizing Provider  acetaminophen (TYLENOL) 500 MG tablet Take 1,000 mg by mouth every 6 (six) hours as needed for pain.  [provider]  albuterol (PROVENTIL HFA;VENTOLIN HFA) 108 (90 BASE) MCG/ACT inhaler Inhale 2 puffs into the lungs every 4 (four) hours as needed for wheezing or shortness of breath.     [provider]  albuterol (PROVENTIL) (2.5 MG/3ML) 0.083% nebulizer solution USE 1 VIAL IN NEBULIZER EVERY 4 HOURS AS NEEDED FOR WHEEZING OR SHORTNESS OF BREATH Patient taking differently: Take 2.5 mg by nebulization every 4 (four) hours as needed  for wheezing or shortness of breath.  05/29/18   Tanda Rockers, MD  b complex vitamins tablet Take 1 tablet by mouth 2 (two) times daily. Patient taking differently: Take 1 tablet by mouth daily.  01/28/12   Eugenie Filler, MD  BEVESPI AEROSPHERE 9-4.8 MCG/ACT AERO INHALE 2 PUFFS INTO THE LUNGS 2 TIMES A DAY Patient taking differently: Take 2 puffs by mouth 2 (two) times a day.  08/26/18   Tanda Rockers, MD  Cholecalciferol ([employer] D3) 2000 units CAPS Take 2,000 Units by mouth daily.    [provider]  cyclobenzaprine (FLEXERIL) 5 MG tablet Take 5 mg by mouth every 8 (eight) hours as needed for muscle spasms.  11/22/13   [provider]  feeding supplement, ENSURE ENLIVE, (ENSURE ENLIVE) LIQD Take 237 mLs by mouth 2 (two) times daily between meals. 07/29/18   Florencia Reasons, MD  folic acid (FOLVITE) 1 MG tablet Take 1 mg by mouth daily.    [provider]  gabapentin (NEURONTIN) 300 MG capsule Take 600 mg by mouth at bedtime.     [provider]  guaiFENesin (MUCINEX) 600 MG 12 hr tablet Take 1 tablet (600 mg total) by mouth 2 (two) times daily. 07/29/18 07/29/19  Florencia Reasons, MD  levETIRAcetam (KEPPRA) 500 MG tablet Take 1 tablet (500 mg total) by mouth 2 (two) times daily. 01/28/12   Eugenie Filler, MD  metoprolol succinate (TOPROL XL) 25 MG 24 hr tablet Take 25 mg by mouth daily.  09/30/13   [provider]  Multiple Vitamin (MULITIVITAMIN WITH MINERALS) TABS Take 1 tablet by mouth daily. 06/07/11   Dorian Heckle, MD  naltrexone (DEPADE) 50 MG tablet Take 50 mg by mouth daily. 06/17/18   [provider]  predniSONE (DELTASONE) 10 MG tablet Label  & dispense according to the schedule below. 6 Pills PO on day one then, 5 Pills PO on day two, 4 Pills PO on day three, 3Pills PO on day four, 2 Pills PO on day five, THEN TAKE ONE TABLET DAILY. Patient not taking: Reported on 09/25/2018 07/29/18   Florencia Reasons, MD  spironolactone (ALDACTONE) 25 MG tablet Take 25 mg by  mouth daily.    [provider]  thiamine 100 MG tablet Take 1 tablet (100 mg total) by mouth daily. 06/07/11   Dorian Heckle, MD  Past Surgical History Past Surgical History:  Procedure Laterality Date  . BACK SURGERY    . BRAIN SURGERY    . BREAST EXCISIONAL BIOPSY Right 1982  . breast mass removal    . BREAST SURGERY    . CRANIOTOMY    . IR GENERIC HISTORICAL  02/28/2016   IR RADIOLOGIST EVAL & MGMT 02/28/2016 Aletta Edouard, MD [facility] INTER[facility] RAD  . IR GENERIC HISTORICAL  05/10/2016   IR VENIPUNCTURE 35YRS/OLDER BY MD [facility]  . IR GENERIC HISTORICAL  05/10/2016   IR US GUIDE VASC ACCESS RIGHT [facility]  . IR GENERIC HISTORICAL  06/26/2016   IR RADIOLOGIST EVAL & MGMT 06/26/2016 Aletta Edouard, MD [facility] INTERV RAD  . LEFT HEART CATHETERIZATION WITH CORONARY ANGIOGRAM N/A 06/06/2011   Procedure: LEFT HEART CATHETERIZATION WITH CORONARY ANGIOGRAM;  Surgeon: Burnell Blanks, MD;  Location: [facility] CATH LAB: Moderate ~ 40% RCA disease noted. Otherwise normal  . RADIOLOGY WITH ANESTHESIA N/A 05/10/2016   Procedure: Microwave thermal ablation liver;  Surgeon: Aletta Edouard, MD;  Location: [facility] ORS;  Service: Radiology;  Laterality: N/A;  . TRANSTHORACIC ECHOCARDIOGRAM  05/2011   Insetting of seizure, positive troponin. EF 30-35% with anterior hypokinesis.  . TRANSTHORACIC ECHOCARDIOGRAM  12/2013   EF 60-65%. GR 1 DD. No significant valvular disease.   Family History Family History  Problem Relation Age of Onset  . Coronary artery disease Father   . Breast cancer Maternal Aunt 60    Social History Social History   Tobacco Use  . Smoking status: Current Every Day Smoker    Packs/day: 1.00    Years: 40.00    Pack years: 40.00    Types: Cigarettes  . Smokeless tobacco: Never Used  Substance Use Topics  . Alcohol use: Yes    Alcohol/week:  8.0 standard drinks    Types: 8 Cans of beer per week    Comment: per week  . Drug use: Yes    Types: Cocaine    Comment: not in years   Allergies Patient has no known allergies.  Review of Systems Review of Systems All other systems are reviewed and are negative for acute change except as noted in the HPI  Physical Exam Vital Signs  I have reviewed the triage vital signs BP 139/76 (BP Location: Right Arm)   Pulse 79   Temp 97.6 F (36.4 C) (Oral)   Resp 15   Ht 5\' 8"  (1.727 m)   Wt 48 kg   LMP  (LMP Unknown)   SpO2 100%   BMI 16.09 kg/m   Physical Exam Vitals signs reviewed.  Constitutional:      General: She is not in acute distress.    Appearance: She is well-developed. She is not diaphoretic.  HENT:     Head: Normocephalic and atraumatic.     Nose: Nose normal.  Eyes:     General: No scleral icterus.       Right eye: No discharge.        Left eye: No discharge.     Conjunctiva/sclera: Conjunctivae normal.     Pupils: Pupils are equal, round, and reactive to light.  Neck:     Musculoskeletal: Normal range of motion and neck supple.  Cardiovascular:     Rate and Rhythm: Normal rate and regular rhythm.     Heart sounds: No murmur. No friction rub. No gallop.   Pulmonary:     Effort: Pulmonary effort is normal. Prolonged expiration present. No accessory muscle usage,  respiratory distress or retractions.     Breath sounds: Normal breath sounds. Decreased air movement (throughout) present. No stridor. No wheezing, rhonchi or rales.  Abdominal:     General: There is no distension.     Palpations: Abdomen is soft.     Tenderness: There is no abdominal tenderness.  Musculoskeletal:        General: No tenderness.  Skin:    General: Skin is warm and dry.     Findings: No erythema or rash.  Neurological:     Mental Status: She is alert and oriented to person, place, and time.     ED Results and Treatments Labs (all labs ordered are listed, but only abnormal  results are displayed) Labs Reviewed  SARS CORONAVIRUS 2 ([facility])  CBC WITH DIFFERENTIAL/PLATELET  BASIC METABOLIC PANEL  BLOOD GAS, ARTERIAL                                                                                                                         EKG  EKG Interpretation  Date/Time:    Ventricular Rate:    PR Interval:    QRS Duration:   QT Interval:    QTC Calculation:   R Axis:     Text Interpretation:        Radiology No results found. Pertinent labs & imaging results that were available during my care of the patient were reviewed by me and considered in my medical decision making (see chart for details).  Medications Ordered in ED Medications  nicotine (NICODERM CQ - dosed in mg/24 hours) patch 14 mg (14 mg Transdermal Patch Applied 09/25/18 0644)  albuterol (VENTOLIN HFA) 108 (90 Base) MCG/ACT inhaler 6 puff (has no administration in time range)  ipratropium (ATROVENT HFA) inhaler 2 puff (has no administration in time range)  albuterol (VENTOLIN HFA) 108 (90 Base) MCG/ACT inhaler 6 puff (6 puffs Inhalation Given 09/25/18 0644)  ipratropium (ATROVENT HFA) inhaler 2 puff (2 puffs Inhalation Given 09/25/18 0643)  predniSONE (DELTASONE) tablet 40 mg (40 mg Oral Given 09/25/18 1017)                                                                                                                                    Procedures Procedures  (including critical care time)  Medical Decision Making / ED Course I have reviewed the nursing notes for  this encounter and the patient's prior records (if available in EHR or on provided paperwork).    Patient presents with 1 day of gradually worsening shortness of breath.  Currently improved after being placed on oxygen by EMS.  On lung exam she has poor air movement throughout.  Not able to appreciate wheezing or rails.  She does not appear to be in any acute respiratory distress at  this time.  We will provide the patient with oral steroids and breathing treatments.  Will reassess.   On reassessment patient reports worsening shortness of breath just prior to the breathing treatments but did improve afterwards.  We will provide patient with additional breathing treatments and reassess.  Will obtain screening labs, chest x-ray and cover testing in case patient requires multiple treatments and admission.  Patient care turned over to Dr Zenia Resides at 0730. Patient case and results discussed in detail; please see their note for further ED managment.      Final Clinical Impression(s) / ED Diagnoses Final diagnoses:  SOB (shortness of breath)  COPD exacerbation ([place])      This chart was dictated using voice recognition software.  Despite best efforts to proofread,  errors can occur which can change the documentation meaning.   Fatima Blank, MD 09/25/18 [phone]

## 2018-11-01 ENCOUNTER — Emergency Department (HOSPITAL_COMMUNITY): Payer: Medicaid Other

## 2018-11-01 ENCOUNTER — Other Ambulatory Visit: Payer: Self-pay

## 2018-11-01 ENCOUNTER — Emergency Department (HOSPITAL_COMMUNITY)
Admission: EM | Admit: 2018-11-01 | Discharge: 2018-11-01 | Disposition: A | Discharge status: Home / Self Care | Payer: Medicaid Other | Attending: Emergency Medicine | Admitting: Emergency Medicine

## 2018-11-01 DIAGNOSIS — Z7982 Long term (current) use of aspirin: Secondary | ICD-10-CM | POA: Insufficient documentation

## 2018-11-01 DIAGNOSIS — Z85118 Personal history of other malignant neoplasm of bronchus and lung: Secondary | ICD-10-CM | POA: Diagnosis not present

## 2018-11-01 DIAGNOSIS — F17210 Nicotine dependence, cigarettes, uncomplicated: Secondary | ICD-10-CM | POA: Diagnosis not present

## 2018-11-01 DIAGNOSIS — I110 Hypertensive heart disease with heart failure: Secondary | ICD-10-CM | POA: Insufficient documentation

## 2018-11-01 DIAGNOSIS — R0781 Pleurodynia: Secondary | ICD-10-CM

## 2018-11-01 DIAGNOSIS — Z9981 Dependence on supplemental oxygen: Secondary | ICD-10-CM | POA: Diagnosis not present

## 2018-11-01 DIAGNOSIS — R109 Unspecified abdominal pain: Secondary | ICD-10-CM

## 2018-11-01 DIAGNOSIS — I5032 Chronic diastolic (congestive) heart failure: Secondary | ICD-10-CM | POA: Diagnosis not present

## 2018-11-01 DIAGNOSIS — Z79899 Other long term (current) drug therapy: Secondary | ICD-10-CM | POA: Insufficient documentation

## 2018-11-01 DIAGNOSIS — J449 Chronic obstructive pulmonary disease, unspecified: Secondary | ICD-10-CM | POA: Diagnosis not present

## 2018-11-01 DIAGNOSIS — R1011 Right upper quadrant pain: Secondary | ICD-10-CM | POA: Diagnosis present

## 2018-11-01 DIAGNOSIS — W19XXXA Unspecified fall, initial encounter: Secondary | ICD-10-CM

## 2018-11-01 DIAGNOSIS — W01198A Fall on same level from slipping, tripping and stumbling with subsequent striking against other object, initial encounter: Secondary | ICD-10-CM | POA: Diagnosis not present

## 2018-11-01 LAB — URINALYSIS, ROUTINE W REFLEX MICROSCOPIC
Bilirubin Urine: NEGATIVE
Glucose, UA: NEGATIVE mg/dL
Hgb urine dipstick: NEGATIVE
Ketones, ur: NEGATIVE mg/dL
Leukocytes,Ua: NEGATIVE
Nitrite: NEGATIVE
Protein, ur: NEGATIVE mg/dL
Specific Gravity, Urine: >1.046 — ABNORMAL HIGH (ref 1.005–1.030)
pH: 6 (ref 5.0–8.0)

## 2018-11-01 LAB — CBC WITH DIFFERENTIAL/PLATELET
Abs Immature Granulocytes: 0.03 10*3/uL (ref 0.00–0.07)
Basophils Absolute: 0 10*3/uL (ref 0.0–0.1)
Basophils Relative: 0 %
Eosinophils Absolute: 0.1 10*3/uL (ref 0.0–0.5)
Eosinophils Relative: 1 %
HCT: 47 % — ABNORMAL HIGH (ref 36.0–46.0)
Hemoglobin: 14.6 g/dL (ref 12.0–15.0)
Immature Granulocytes: 0 %
Lymphocytes Relative: 16 %
Lymphs Abs: 1.9 10*3/uL (ref 0.7–4.0)
MCH: 31.8 pg (ref 26.0–34.0)
MCHC: 31.1 g/dL (ref 30.0–36.0)
MCV: 102.4 fL — ABNORMAL HIGH (ref 80.0–100.0)
Monocytes Absolute: 0.8 10*3/uL (ref 0.1–1.0)
Monocytes Relative: 7 %
Neutro Abs: 8.8 10*3/uL — ABNORMAL HIGH (ref 1.7–7.7)
Neutrophils Relative %: 76 %
Platelets: 137 10*3/uL — ABNORMAL LOW (ref 150–400)
RBC: 4.59 MIL/uL (ref 3.87–5.11)
RDW: 13.7 % (ref 11.5–15.5)
WBC: 11.6 10*3/uL — ABNORMAL HIGH (ref 4.0–10.5)
nRBC: 0 % (ref 0.0–0.2)

## 2018-11-01 LAB — COMPREHENSIVE METABOLIC PANEL
ALT: 50 U/L — ABNORMAL HIGH (ref 0–44)
AST: 50 U/L — ABNORMAL HIGH (ref 15–41)
Albumin: 3.5 g/dL (ref 3.5–5.0)
Alkaline Phosphatase: 96 U/L (ref 38–126)
Anion gap: 8 (ref 5–15)
BUN: 12 mg/dL (ref 6–20)
CO2: 34 mmol/L — ABNORMAL HIGH (ref 22–32)
Calcium: 8.8 mg/dL — ABNORMAL LOW (ref 8.9–10.3)
Chloride: 100 mmol/L (ref 98–111)
Creatinine, Ser: 0.63 mg/dL (ref 0.44–1.00)
GFR calc Af Amer: >60 mL/min (ref >60)
GFR calc non Af Amer: >60 mL/min (ref >60)
Glucose, Bld: 88 mg/dL (ref 70–99)
Potassium: 4 mmol/L (ref 3.5–5.1)
Sodium: 142 mmol/L (ref 135–145)
Total Bilirubin: 0.7 mg/dL (ref 0.3–1.2)
Total Protein: 6.6 g/dL (ref 6.5–8.1)

## 2018-11-01 LAB — PROTIME-INR
INR: 1 (ref 0.8–1.2)
Prothrombin Time: 13.4 seconds (ref 11.4–15.2)

## 2018-11-01 LAB — ETHANOL: Alcohol, Ethyl (B): <10 mg/dL (ref <10)

## 2018-11-01 MED ORDER — SODIUM CHLORIDE (PF) 0.9 % IJ SOLN
INTRAMUSCULAR | Status: AC
  Filled 2018-11-01: qty 50

## 2018-11-01 MED ORDER — LIDOCAINE 5 % EX PTCH
1.0000 | MEDICATED_PATCH | CUTANEOUS | Status: DC
  Administered 2018-11-01: 1 via TRANSDERMAL
  Filled 2018-11-01: qty 1

## 2018-11-01 MED ORDER — TRAMADOL HCL 50 MG PO TABS: 50.0000 mg | ORAL_TABLET | Freq: Four times a day (QID) | ORAL | 0 refills | Status: DC | PRN

## 2018-11-01 MED ORDER — FENTANYL CITRATE (PF) 100 MCG/2ML IJ SOLN
50.0000 ug | Freq: Once | INTRAMUSCULAR | Status: AC
  Administered 2018-11-01: 10:00:00 50 ug via INTRAVENOUS
  Filled 2018-11-01: qty 2

## 2018-11-01 MED ORDER — IOHEXOL 300 MG/ML  SOLN
100.0000 mL | Freq: Once | INTRAMUSCULAR | Status: AC | PRN
  Administered 2018-11-01: 11:00:00 100 mL via INTRAVENOUS

## 2018-11-01 MED ORDER — TRAMADOL HCL 50 MG PO TABS
50.0000 mg | ORAL_TABLET | Freq: Once | ORAL | Status: AC
  Administered 2018-11-01: 50 mg via ORAL
  Filled 2018-11-01: qty 1

## 2018-11-01 MED ORDER — ALBUTEROL SULFATE HFA 108 (90 BASE) MCG/ACT IN AERS
2.0000 | INHALATION_SPRAY | Freq: Once | RESPIRATORY_TRACT | Status: AC
  Administered 2018-11-01: 12:00:00 2 via RESPIRATORY_TRACT
  Filled 2018-11-01: qty 6.7

## 2018-11-01 MED ORDER — SODIUM CHLORIDE 0.9 % IV BOLUS
500.0000 mL | Freq: Once | INTRAVENOUS | Status: AC
  Administered 2018-11-01: 10:00:00 500 mL via INTRAVENOUS

## 2018-11-01 NOTE — ED Notes (Signed)
Bed: JW92 Expected date:  Expected time:  Means of arrival:  Comments: 63 yo fall, low O2 sats

## 2018-11-01 NOTE — ED Triage Notes (Addendum)
PT BIBA from home c/o mechanical fall.  Pt reports she wears 2L  at all times, did not wear it when she walked to bathroom, became dizzy, lost balance and fell.  Pt reports hitting right side on porcelain tub.  Pt reports falling appx 0330 today.  Denies LOC, reports taking ASA 81 mg daily.   EMS reports pt was 86% on 2L O2 on arrival to home, pt tripoding.  Pt placed on NRB, O2 increased to 100%, pt returned to [place] on arrival to ED.  EMS reports pt stating she is prescribed scheduled breathing tx, but has not been taking them.

## 2018-11-01 NOTE — ED Notes (Signed)
Patient transported to CT 

## 2018-11-01 NOTE — ED Provider Notes (Signed)
[facility] DEPT Provider Note   CSN: [id number] Arrival date & time: 11/01/18  0901     History   Chief Complaint Chief Complaint  Patient presents with   Fall    HPI Wanda Prince is a 63 y.o. female.     Wanda Prince is a 63 y.o. 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  Patient reports that she fell in the bathroom and did not wear her oxygen, when she stood up from the toilet she felt a little bit dizzy and lost her balance and fell striking her right side on the edge of a porcelain bathtub.  She did not hit her head, no loss of consciousness, denies neck or back pain.  No pain in her extremities.  Reports pain over her right lateral ribs with some shortness of breath.  Initially when EMS arrived on the scene she was without her oxygen and satting at 86% but on arrival to the ED and has returned back to her normal 2 L of home oxygen and is satting well and breathing comfortably.  She also reports pain over her right flank and the right upper quadrant of her abdomen.  No bruising or bleeding.  No wounds.  Denies any other injuries from the fall.     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   COPD (chronic obstructive pulmonary disease) ([place])    H/O tracheostomy 04/22/2015   respiratory destress    History of acute alcoholic hepatitis    History of alcohol abuse    History of cocaine abuse ([place])    History of iron deficiency    Hx of ventricular shunt 2009   Hypertension    Left breast mass    Major depressive disorder    History of   Meningitis 2009   Primary cancer of right upper lobe of lung ([place]) 07/12/2015   Psychiatric  diagnosis    Multiple   Respiratory arrest ([place])    secondary to Subependymal abscess   Seizure disorder ([place])    Seizures ([place])    Suicide attempt [facility])    History of suicide attempts August 2006 and July 2006    Patient Active Problem List   Diagnosis Date Noted   Chronic respiratory failure with hypoxia ([place]) 08/04/2018   Polysubstance abuse ([place]) 07/28/2018   Chronic respiratory failure with hypercapnia ([place]) 02/26/2018   Liver lesion 05/11/2016   Hepatocellular carcinoma ([place]) 05/10/2016   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/01/2016   DOE (dyspnea on exertion) 04/01/2016   Preoperative cardiovascular examination 04/01/2016   Primary cancer of right upper lobe of lung ([place]) 07/12/2015   COPD  GOLD IV  05/18/2015   Acute respiratory failure (H[facility])    Cavitating mass in right upper lung lobe    Liver mass    Encounter for imaging study to confirm orogastric (OG) tube placement    Encounter for nasogastric (NG) tube placement    Acute hypoxemic respiratory failure ([place])    COPD exacerbation ([place]) 04/22/2015   COPD with acute exacerbation ([place]) 01/10/2014   Acute respiratory failure with hypoxia and hypercapnia ([place]) 01/10/2014   Essential hypertension 01/10/2014   Seizure disorder ([place]) 01/25/2012  Chronic diastolic CHF (congestive heart failure) ([place]) 01/25/2012   Coronary artery disease, non-occlusive 06/07/2011   Cigarette smoker 06/04/2011   Alcohol abuse 06/04/2011   History of Abscess, brain 06/03/2011   History of suicide attempt 06/03/2011   Intracranial hemorrhage ([place]) 06/03/2011    Past Surgical History:  Procedure Laterality Date   BACK SURGERY     BRAIN SURGERY     BREAST EXCISIONAL BIOPSY Right 1982   breast mass removal     BREAST SURGERY     CRANIOTOMY     IR GENERIC HISTORICAL  02/28/2016   IR RADIOLOGIST EVAL & MGMT 02/28/2016 Aletta Edouard, MD [facility]   IR GENERIC HISTORICAL  05/10/2016   IR  VENIPUNCTURE 69YRS/OLDER BY MD [facility]   IR GENERIC HISTORICAL  05/10/2016   IR US GUIDE VASC ACCESS RIGHT [facility]   IR GENERIC HISTORICAL  06/26/2016   IR RADIOLOGIST EVAL & MGMT 06/26/2016 Aletta Edouard, MD [facility]   LEFT HEART CATHETERIZATION WITH CORONARY ANGIOGRAM N/A 06/06/2011   Procedure: LEFT HEART CATHETERIZATION WITH CORONARY ANGIOGRAM;  Surgeon: Burnell Blanks, MD;  Location: [facility] CATH LAB: Moderate ~ 40% RCA disease noted. Otherwise normal   RADIOLOGY WITH ANESTHESIA N/A 05/10/2016   Procedure: Microwave thermal ablation liver;  Surgeon: Aletta Edouard, MD;  Location: [facility];  Service: Radiology;  Laterality: N/A;   TRANSTHORACIC ECHOCARDIOGRAM  05/2011   Insetting of seizure, positive troponin. EF 30-35% with anterior hypokinesis.   TRANSTHORACIC ECHOCARDIOGRAM  12/2013   EF 60-65%. GR 1 DD. No significant valvular disease.     OB History    Gravida  2   Para  1   Term      Preterm      AB  1   Living        SAB      TAB      Ectopic      Multiple      Live Births               Home Medications    Prior to Admission medications   Medication Sig Start Date End Date Taking? Authorizing Provider  acetaminophen (TYLENOL) 500 MG tablet Take 1,000 mg by mouth every 6 (six) hours as needed for pain.   Yes [provider]  albuterol (PROVENTIL HFA;VENTOLIN HFA) 108 (90 BASE) MCG/ACT inhaler Inhale 2 puffs into the lungs every 4 (four) hours as needed for wheezing or shortness of breath.    Yes [provider]  albuterol (PROVENTIL) (2.5 MG/3ML) 0.083% nebulizer solution USE 1 VIAL IN NEBULIZER EVERY 4 HOURS AS NEEDED FOR WHEEZING OR SHORTNESS OF BREATH Patient taking differently: Take 2.5 mg by nebulization every 4 (four) hours as needed for wheezing or shortness of breath.  05/29/18  Yes Tanda Rockers, MD  aspirin EC 81 MG tablet Take 81 mg by mouth daily.   Yes [provider]  BEVESPI AEROSPHERE  9-4.8 MCG/ACT AERO INHALE 2 PUFFS INTO THE LUNGS 2 TIMES A DAY Patient taking differently: Take 2 puffs by mouth 2 (two) times a day.  08/26/18  Yes Tanda Rockers, MD  feeding supplement, ENSURE ENLIVE, (ENSURE ENLIVE) LIQD Take 237 mLs by mouth 2 (two) times daily between meals. Patient taking differently: Take 237 mLs by mouth 3 (three) times daily between meals.  07/29/18  Yes Florencia Reasons, MD  folic acid (FOLVITE) 1 MG tablet Take 1 mg by mouth daily.   Yes [provider]  gabapentin (NEURONTIN) 300 MG capsule Take 600 mg by mouth at bedtime.    Yes [provider]  levETIRAcetam (KEPPRA) 500 MG tablet Take 1 tablet (500 mg total) by mouth 2 (two) times daily. 01/28/12  Yes Eugenie Filler, MD  metoprolol succinate (TOPROL XL) 25 MG 24 hr tablet Take 25 mg by mouth daily.  09/30/13  Yes [provider]  Multiple Vitamin (MULITIVITAMIN WITH MINERALS) TABS Take 1 tablet by mouth daily. 06/07/11  Yes Dorian Heckle, MD  naltrexone (DEPADE) 50 MG tablet Take 50 mg by mouth daily. 06/17/18  Yes [provider]  nicotine (NICOTINE STEP 1) 21 mg/24hr patch Place 21 mg onto the skin daily.   Yes [provider]  predniSONE (DELTASONE) 10 MG tablet Label  & dispense according to the schedule below. 6 Pills PO on day one then, 5 Pills PO on day two, 4 Pills PO on day three, 3Pills PO on day four, 2 Pills PO on day five, THEN TAKE ONE TABLET DAILY. Patient taking differently: Take 10 mg by mouth 2 (two) times a day.  07/29/18  Yes Florencia Reasons, MD  spironolactone (ALDACTONE) 25 MG tablet Take 25 mg by mouth daily.   Yes [provider]  thiamine 100 MG tablet Take 1 tablet (100 mg total) by mouth daily. 06/07/11  Yes Dorian Heckle, MD  b complex vitamins tablet Take 1 tablet by mouth 2 (two) times daily. Patient not taking: Reported on 09/25/2018 01/28/12   Eugenie Filler, MD  guaiFENesin (MUCINEX) 600 MG 12 hr tablet Take 1 tablet (600 mg total) by mouth 2 (two)  times daily. Patient not taking: Reported on 11/01/2018 07/29/18 07/29/19  Florencia Reasons, MD  traMADol (ULTRAM) 50 MG tablet Take 1 tablet (50 mg total) by mouth every 6 (six) hours as needed. 11/01/18   Jacqlyn Larsen, PA-C    Family History Family History  Problem Relation Age of Onset   Coronary artery disease Father    Breast cancer Maternal Aunt 60    Social History Social History   Tobacco Use   Smoking status: Current Every Day Smoker    Packs/day: 1.00    Years: 40.00    Pack years: 40.00    Types: Cigarettes   Smokeless tobacco: Never Used  Substance Use Topics   Alcohol use: Yes    Alcohol/week: 8.0 standard drinks    Types: 8 Cans of beer per week    Comment: per week   Drug use: Yes    Types: Cocaine    Comment: not in years     Allergies   Patient has no known allergies.   Review of Systems Review of Systems  Constitutional: Negative for chills and fever.  HENT: Negative for facial swelling.   Eyes: Negative for visual disturbance.  Respiratory: Positive for shortness of breath. Negative for cough.   Cardiovascular: Positive for chest pain. Negative for palpitations and leg swelling.  Gastrointestinal: Positive for abdominal pain. Negative for constipation, diarrhea, nausea and vomiting.  Genitourinary: Positive for flank pain. Negative for hematuria.  Musculoskeletal: Negative for arthralgias, back pain, myalgias and neck pain.  Skin: Negative for color change and wound.  Neurological: Positive for light-headedness. Negative for weakness, numbness and headaches.     Physical Exam Updated Vital Signs BP 129/85    Pulse 83    Temp 97.7 F (36.5 C) (Oral)    Resp (!) 24    LMP  (LMP Unknown)  SpO2 100%   Physical Exam Vitals signs and nursing note reviewed.  Constitutional:      General: She is not in acute distress.    Appearance: She is well-developed. She is not diaphoretic.  HENT:     Head: Normocephalic and atraumatic.     Comments: Scalp  without signs of trauma, no palpable hematoma, no step-off, negative battle sign    Nose: Nose normal.     Mouth/Throat:     Mouth: Mucous membranes are moist.     Pharynx: Oropharynx is clear.  Eyes:     General:        Right eye: No discharge.        Left eye: No discharge.     Extraocular Movements: Extraocular movements intact.     Conjunctiva/sclera: Conjunctivae normal.     Pupils: Pupils are equal, round, and reactive to light.  Neck:     Musculoskeletal: Neck supple.  Cardiovascular:     Rate and Rhythm: Normal rate and regular rhythm.     Heart sounds: Normal heart sounds.  Pulmonary:     Effort: Pulmonary effort is normal. No respiratory distress.     Breath sounds: Normal breath sounds. No wheezing or rales.     Comments: Patient on home 2 L nasal cannula, breathing at baseline, lungs with decreased breath sounds throughout, there is tenderness over the left lateral and posterior ribs but no palpable crepitus or deformity, no flail chest, no overlying ecchymosis. Chest:     Chest wall: Tenderness present.  Abdominal:     General: Bowel sounds are normal. There is no distension.     Palpations: Abdomen is soft. There is no mass.     Tenderness: There is abdominal tenderness. There is no guarding.     Comments: Abdomen is soft and nondistended without ecchymosis, there is some tenderness in the right upper quadrant without guarding, there is also tenderness over the right flank, no palpable deformity.  Musculoskeletal:        General: No deformity.     Comments: T-spine and L-spine nontender to palpation at midline. Patient moves all extremities without difficulty. All joints supple and easily movable, no erythema, swelling or palpable deformity, all compartments soft.  Skin:    General: Skin is warm and dry.     Capillary Refill: Capillary refill takes less than 2 seconds.  Neurological:     Mental Status: She is alert and oriented to person, place, and time.      Coordination: Coordination normal.     Comments: Speech is clear, able to follow commands CN III-XII intact Normal strength in upper and lower extremities bilaterally including dorsiflexion and plantar flexion, strong and equal grip strength Sensation normal to light and sharp touch Moves extremities without ataxia, coordination intact   Psychiatric:        Mood and Affect: Mood normal.        Behavior: Behavior normal.      ED Treatments / Results  Labs (all labs ordered are listed, but only abnormal results are displayed) Labs Reviewed  COMPREHENSIVE METABOLIC PANEL - Abnormal; Notable for the following components:      Result Value   CO2 34 (*)    Calcium 8.8 (*)    AST 50 (*)    ALT 50 (*)    All other components within normal limits  CBC WITH DIFFERENTIAL/PLATELET - Abnormal; Notable for the following components:   WBC 11.6 (*)    HCT 47.0 (*)  MCV 102.4 (*)    Platelets 137 (*)    Neutro Abs 8.8 (*)    All other components within normal limits  URINALYSIS, ROUTINE W REFLEX MICROSCOPIC - Abnormal; Notable for the following components:   Specific Gravity, Urine >1.046 (*)    All other components within normal limits  PROTIME-INR  ETHANOL    EKG EKG Interpretation  Date/Time:  Sunday November 01 2018 09:15:19 EDT Ventricular Rate:  78 PR Interval:    QRS Duration: 71 QT Interval:  378 QTC Calculation: 431 R Axis:   82 Text Interpretation:  Sinus rhythm Atrial premature complexes Borderline right axis deviation ST elevation, consider inferior injury No significant change since last tracing Confirmed by Lacretia Leigh ([id number]) on 11/01/2018 9:23:38 AM   Radiology Ct Chest W Contrast  Result Date: 11/01/2018 CLINICAL DATA:  Recent fall last night, right lower chest and upper abdominal pain posteriorly. History of COPD, cirrhosis, hepatitis C, and previous hepatocellular carcinoma status post microwave ablation. EXAM: CT CHEST, ABDOMEN, AND PELVIS WITH CONTRAST  TECHNIQUE: Multidetector CT imaging of the chest, abdomen and pelvis was performed following the standard protocol during bolus administration of intravenous contrast. CONTRAST:  14mL OMNIPAQUE IOHEXOL 300 MG/ML  SOLN COMPARISON:  06/02/2017, 06/25/2016, 05/10/2016 FINDINGS: CT CHEST FINDINGS Cardiovascular: Thoracic aortic atherosclerosis noted. Negative for aneurysm or dissection. No mediastinal hemorrhage or hematoma. Patent 3 vessel arch anatomy. Central pulmonary arteries are patent. Normal heart size. Native coronary atherosclerosis evident. No pericardial effusion. Mediastinum/Nodes: No enlarged mediastinal, hilar, or axillary lymph nodes. Thyroid gland, trachea, and esophagus demonstrate no significant findings. Lungs/Pleura: Stable moderate to severe emphysema pattern with hyperinflation. Upper lobe predominant emphysema pattern. 12 mm right upper lobe bronchovascular cavitary lesion with adjacent parenchymal scarring is stable in size. 9 mm central left upper lobe nodule is also stable. Stable scattered areas of parenchymal scarring in the upper lobes. Minor dependent bibasilar hypoventilatory changes/atelectasis. No pleural abnormality, effusion, or pneumothorax. No pulmonary hemorrhage or contusion. Trachea central airways are patent. Musculoskeletal: No chest wall soft tissue asymmetry or hematoma. No visualized displaced rib fracture or definite focal rib abnormality. Healed rib fractures noted bilaterally. Thoracic spine and sternum appear intact. CT ABDOMEN PELVIS FINDINGS Hepatobiliary: Nodularity of the liver compatible with underlying hepatic cirrhosis. Stable ablation defect in the anterior right inferior liver (segment 5). This measures 2.2 x 2.1 cm, previously 2.5 x 2.1 cm. Stable subcentimeter hypodense lesions in the left lobe, image 63 measuring 10 mm or less in size. No new or enlarging hepatic lesions. No biliary dilatation or obstruction. Distended gallbladder noted. Fundus of the  gallbladder is anterior to the right lobe. Common bile duct nondilated. Hepatic and portal veins are patent. Pancreas: Unremarkable. No pancreatic ductal dilatation or surrounding inflammatory changes. Spleen: Normal in size without focal abnormality. Adrenals/Urinary Tract: Stable adrenal calcifications without mass or enlargement. No focal renal abnormality, hydronephrosis or obstructive uropathy. Ureters are symmetric and decompressed. No obstructing ureteral calculus. Bladder unremarkable. Stomach/Bowel: Negative for bowel obstruction, significant dilatation, ileus, or free air. Normal appendix demonstrated. Scattered colonic diverticulosis, most pronounced in sigmoid colon. No free fluid, fluid collection, hemorrhage, abscess, hematoma, or ascites. VP shunt tubing noted extending into the pelvis. Vascular/Lymphatic: Extensive abdominal atherosclerosis. No aneurysm or occlusive process. Mesenteric and vasculature appear patent. Iliac vessels patent. No veno-occlusive process. No bulky adenopathy. Reproductive: Uterus adnexa normal in size. Trace pelvic free fluid. Other: Intact abdominal wall.  Negative for hernia. Musculoskeletal: Bones are osteopenic. Degenerative changes noted. No acute osseous finding. IMPRESSION: No acute  intrathoracic finding or injury. No displaced rib fracture or focal chest wall abnormality. Advanced emphysema. Stable bilateral upper lobe pulmonary nodules. Thoracic aortic atherosclerosis without aneurysm No superimposed acute chest process No acute intra-abdominal or pelvic finding or injury. Stable hepatic cirrhosis and previous right hepatic ablation site. Stable subcentimeter left hepatic indeterminate hypodensities as before. Extensive abdominal atherosclerosis without occlusive process Extensive sigmoid diverticulosis without definite acute inflammatory process Electronically Signed   By: Jerilynn Mages.  Shick M.D.   On: 11/01/2018 12:10   Ct Abdomen Pelvis W Contrast  Result Date:  11/01/2018 CLINICAL DATA:  Recent fall last night, right lower chest and upper abdominal pain posteriorly. History of COPD, cirrhosis, hepatitis C, and previous hepatocellular carcinoma status post microwave ablation. EXAM: CT CHEST, ABDOMEN, AND PELVIS WITH CONTRAST TECHNIQUE: Multidetector CT imaging of the chest, abdomen and pelvis was performed following the standard protocol during bolus administration of intravenous contrast. CONTRAST:  132mL OMNIPAQUE IOHEXOL 300 MG/ML  SOLN COMPARISON:  06/02/2017, 06/25/2016, 05/10/2016 FINDINGS: CT CHEST FINDINGS Cardiovascular: Thoracic aortic atherosclerosis noted. Negative for aneurysm or dissection. No mediastinal hemorrhage or hematoma. Patent 3 vessel arch anatomy. Central pulmonary arteries are patent. Normal heart size. Native coronary atherosclerosis evident. No pericardial effusion. Mediastinum/Nodes: No enlarged mediastinal, hilar, or axillary lymph nodes. Thyroid gland, trachea, and esophagus demonstrate no significant findings. Lungs/Pleura: Stable moderate to severe emphysema pattern with hyperinflation. Upper lobe predominant emphysema pattern. 12 mm right upper lobe bronchovascular cavitary lesion with adjacent parenchymal scarring is stable in size. 9 mm central left upper lobe nodule is also stable. Stable scattered areas of parenchymal scarring in the upper lobes. Minor dependent bibasilar hypoventilatory changes/atelectasis. No pleural abnormality, effusion, or pneumothorax. No pulmonary hemorrhage or contusion. Trachea central airways are patent. Musculoskeletal: No chest wall soft tissue asymmetry or hematoma. No visualized displaced rib fracture or definite focal rib abnormality. Healed rib fractures noted bilaterally. Thoracic spine and sternum appear intact. CT ABDOMEN PELVIS FINDINGS Hepatobiliary: Nodularity of the liver compatible with underlying hepatic cirrhosis. Stable ablation defect in the anterior right inferior liver (segment 5). This  measures 2.2 x 2.1 cm, previously 2.5 x 2.1 cm. Stable subcentimeter hypodense lesions in the left lobe, image 63 measuring 10 mm or less in size. No new or enlarging hepatic lesions. No biliary dilatation or obstruction. Distended gallbladder noted. Fundus of the gallbladder is anterior to the right lobe. Common bile duct nondilated. Hepatic and portal veins are patent. Pancreas: Unremarkable. No pancreatic ductal dilatation or surrounding inflammatory changes. Spleen: Normal in size without focal abnormality. Adrenals/Urinary Tract: Stable adrenal calcifications without mass or enlargement. No focal renal abnormality, hydronephrosis or obstructive uropathy. Ureters are symmetric and decompressed. No obstructing ureteral calculus. Bladder unremarkable. Stomach/Bowel: Negative for bowel obstruction, significant dilatation, ileus, or free air. Normal appendix demonstrated. Scattered colonic diverticulosis, most pronounced in sigmoid colon. No free fluid, fluid collection, hemorrhage, abscess, hematoma, or ascites. VP shunt tubing noted extending into the pelvis. Vascular/Lymphatic: Extensive abdominal atherosclerosis. No aneurysm or occlusive process. Mesenteric and vasculature appear patent. Iliac vessels patent. No veno-occlusive process. No bulky adenopathy. Reproductive: Uterus adnexa normal in size. Trace pelvic free fluid. Other: Intact abdominal wall.  Negative for hernia. Musculoskeletal: Bones are osteopenic. Degenerative changes noted. No acute osseous finding. IMPRESSION: No acute intrathoracic finding or injury. No displaced rib fracture or focal chest wall abnormality. Advanced emphysema. Stable bilateral upper lobe pulmonary nodules. Thoracic aortic atherosclerosis without aneurysm No superimposed acute chest process No acute intra-abdominal or pelvic finding or injury. Stable hepatic cirrhosis and previous right  hepatic ablation site. Stable subcentimeter left hepatic indeterminate hypodensities as  before. Extensive abdominal atherosclerosis without occlusive process Extensive sigmoid diverticulosis without definite acute inflammatory process Electronically Signed   By: Jerilynn Mages.  Shick M.D.   On: 11/01/2018 12:10   Dg Chest Port 1 View  Result Date: 11/01/2018 CLINICAL DATA:  Fall, right rib pain EXAM: PORTABLE CHEST 1 VIEW COMPARISON:  09/25/2018 FINDINGS: Stable hyperinflation compatible with COPD/emphysema. Normal heart size and vascularity. No focal pneumonia, collapse or consolidation. Negative for edema, effusion or pneumothorax. Trachea midline. Aorta atherosclerotic. Old posterolateral left rib fractures. IMPRESSION: Stable hyperinflation.  No interval change or acute chest process. Electronically Signed   By: Jerilynn Mages.  Shick M.D.   On: 11/01/2018 09:51    Procedures Procedures (including critical care time)  Medications Ordered in ED Medications  sodium chloride (PF) 0.9 % injection (has no administration in time range)  traMADol (ULTRAM) tablet 50 mg (has no administration in time range)  lidocaine (LIDODERM) 5 % 1 patch (has no administration in time range)  sodium chloride 0.9 % bolus 500 mL (500 mLs Intravenous New Bag/Given 11/01/18 0948)  fentaNYL (SUBLIMAZE) injection 50 mcg (50 mcg Intravenous Given 11/01/18 0949)  iohexol (OMNIPAQUE) 300 MG/ML solution 100 mL (100 mLs Intravenous Contrast Given 11/01/18 1120)  albuterol (VENTOLIN HFA) 108 (90 Base) MCG/ACT inhaler 2 puff (2 puffs Inhalation Given 11/01/18 1143)     Initial Impression / Assessment and Plan / ED Course  I have reviewed the triage vital signs and the nursing notes.  Pertinent labs & imaging results that were available during my care of the patient were reviewed by me and considered in my medical decision making (see chart for details).  Patient presents to the emergency department for evaluation after a fall striking her right side on porcelain tub.  She is already on oxygen at baseline, and is currently breathing  comfortably on her usual home 2 L but does have significant tenderness to palpation over the right lateral and posterior ribs and right flank and right upper quadrant, she does have breath sounds present although there are diminished throughout.  Will get immediate portable chest x-ray to rule out pneumothorax and then will get CTs of the chest abdomen and pelvis as well as lab work.  Pain medication given.  Labs overall reassuring, slight leukocytosis of 11.6, normal hemoglobin, normal electrolytes, normal renal and liver function.  Urinalysis without signs of hematuria to suggest kidney injury.  Patient with history of alcohol abuse, but alcohol level negative today and PT/INR normal.  X-ray shows no evidence of rib fractures or pneumothorax.  CTs reassuring as well with no evidence of rib fractures, but worse than previous healed rib fractures present.  No pneumothorax or hemothorax.  There is also no evidence of kidney or liver injury.  CTs of the chest and abdomen overall unremarkable.   Patient reports full improvement in her pain and she reports her breathing is at baseline, she is on her typical home 2 L of O2.  Will discharge with incentive spirometry and pain control.  Precautions discussed.  Patient expresses understanding and agreement with plan.  Discharged home in good condition.  Final Clinical Impressions(s) / ED Diagnoses   Final diagnoses:  Fall, initial encounter  Rib pain on right side  Right flank pain    ED Discharge Orders         Ordered    traMADol (ULTRAM) 50 MG tablet  Every 6 hours PRN     11/01/18 1335  Jacqlyn Larsen, PA-C 11/04/18 1500    Lacretia Leigh, MD 11/06/18 [phone]

## 2018-11-01 NOTE — ED Notes (Signed)
Pt ambulatory with assistance to bathroom

## 2018-11-01 NOTE — Discharge Instructions (Addendum)
Your work-up today is reassuring, fortunately do not have any new rib fractures or injury to your liver kidney and your labs overall look good.  Take prescribed pain medication as directed, this can cause drowsiness, so use extra caution.  In addition to this you can use Tylenol, you can also use over-the-counter salon pas lidocaine patches.  Use the incentive spirometer given to to help ensure you are taking good deep breaths to help prevent pneumonia.  Follow-up with your primary care doctor if pain is not improving.    If you develop worsening pain, worsening shortness of breath, fevers, productive cough or any other new or concerning symptoms please return to the ED.

## 2018-11-01 NOTE — ED Notes (Signed)
Pt provided with inhaler, 2 puffs.  Pt reports feeling better after albuterol administration.  PA Merleen Nicely made aware.

## 2018-11-01 NOTE — ED Notes (Signed)
Per CT tech, when pt transferred over to CT table, pt began to c/o [facility] was requesting her rescue inhaler.  PA Merleen Nicely made aware, verbal order for albuterol inhaler received.

## 2018-11-17 ENCOUNTER — Other Ambulatory Visit: Payer: Self-pay | Admitting: Internal Medicine

## 2018-12-16 ENCOUNTER — Other Ambulatory Visit: Payer: Self-pay | Admitting: Internal Medicine

## 2019-02-23 ENCOUNTER — Ambulatory Visit (INDEPENDENT_AMBULATORY_CARE_PROVIDER_SITE_OTHER): Payer: Medicaid Other | Admitting: Internal Medicine

## 2019-02-23 ENCOUNTER — Other Ambulatory Visit: Payer: Self-pay

## 2019-02-23 ENCOUNTER — Encounter: Payer: Self-pay | Admitting: Internal Medicine

## 2019-02-23 DIAGNOSIS — J9611 Chronic respiratory failure with hypoxia: Secondary | ICD-10-CM | POA: Diagnosis not present

## 2019-02-23 DIAGNOSIS — J449 Chronic obstructive pulmonary disease, unspecified: Secondary | ICD-10-CM

## 2019-02-23 NOTE — Progress Notes (Signed)
Subjective:  Patient ID: Wanda Prince, female   DOB: 1955/10/07    MRN: [id number]    Brief patient profile:  22 yowf active smoker  with deteriorating sob x weeks > admit   Date of Admission: 04/22/2015   Date of Discharge: 05/07/2015 Attending Physician: Aldine Contes, MD  Discharge Diagnosis: Active Problems:  COPD with acute exacerbation ([place])  COPD exacerbation ([place])  Acute respiratory failure with hypoxemia ([place])  Encounter for imaging study to confirm orogastric (OG) tube placement  Encounter for nasogastric (NG) tube placement  Acute respiratory failure ([place])  Lung mass  Liver mass   05/18/2015 1st [facility] office visit/ Jahzeel Poythress  maint rx spiriva/advair but very poor technique  Chief Complaint  Patient presents with  . Pulmonary Consult    Referred by Dr. Suzanna Obey. Pt states that she was dxed with COPD years ago. She c/o increased SOB for the past month. She is SOB just sitting and doing nothing and unable to exert herself much due to SOB.   baseline = used [employer] parking / sometimes used scooter sometimes not  / now sob at rest  Has flutter not using/ very congested cough but no purulent mucus  Sleeps on 2 pillows ok Very poor insight into meds/ flutter/ purse lip  rec Pantoprazole (protonix) 40 mg   Take  30-60 min before first meal of the day and Pepcid (famotidine)  20 mg one @  bedtime until return to office - this is the best way to tell whether stomach acid is contributing to your problem.   GERD  Diet   Stop advair and spiriva and just take anoro 2 good drags each am / one click  For breathing >  Nebulizer with albuterol every 4 hours if needed (if you can't catch your breath)  For cough > flutter valve as much as you can   02/12/2017  f/u ov/Bertram Haddix re:   GOLD IV/ still smoking/ using saba sev times a day  Chief Complaint  Patient presents with  . Follow-up    SOB with activity, weight loss greater than 15 pounds in 6 months   doe still = MMRC3  = can't walk 100 yards even at a slow pace at a flat grade s stopping due to sob  s 02/ HT but not wm leaning  Sleeping ok 2 pillows o/w flat  s am congestion   appetite good / eats well but wt loss/ no fever or sweats  rec Work on inhaler technique:  relax and gently blow all the way out then take a nice smooth deep breath back in, triggering the inhaler at same time you start breathing in.  Hold for up to 5 seconds if you can. Blow out thru nose. Rinse and gargle with water when done Plan A = Automatic = Anoro each am  Plan B = Backup Only use your albuterol(Proair=RED   Yellow= Proventil)  as a rescue medication to be used if you can't catch your breath by resting or doing a relaxed purse lip breathing pattern.  - The less you use it, the better it will work when you need it. - Ok to use the inhaler up to 2 puffs  every 4 hours if you must but call for appointment if use goes up over your usual need - Don't leave home without it !!  (think of it like the spare tire for your car)  Plan C = Crisis - only use your albuterol nebulizer if you  first try Plan B   Keep working on cutting down on smoking > The key is to stop smoking completely before smoking completely stops you!                    02/23/2018  f/u ov/Arbell Wycoff re: gold iv/ still smoking/ maint rx = bevespi Chief Complaint  Patient presents with  . Follow-up    Breathing has been worse over the past few months. She is using her albuterol inhaler 4 x daily on average and neb about 2 x per wk.    Dyspnea:  50 ft = MMRC4  = sob if tries to leave home or while getting dressed   Cough: min Sleeping: on side horizontal rec Prednisone 10 mg x 2 each am with breakfast until better, then try one a day until return The key is to stop smoking completely before smoking completely stops you!     04/30/2018  f/u ov/Hero Mccathern re:  GOLD IV / still smoking  On prednisone 10 mg  Chief Complaint  Patient presents with  . Follow-up     Breathing has been progressively worse. She plans to quit smoking today and is requesting patches.    Dyspnea:  MMRC4  = sob if tries to leave home or while getting dressed  Though some better on prednisone  Vs prior  Cough: no Sleeping: on flat bed/ 2 pillows  SABA use: 3 x daily if active/ not much is stays still  rec Prednisone 10 mg x 2 each am with breakfast until better, then try one a day until return - if worse, go back to 2 each am and start over  The key is to stop smoking completely before smoking completely stops you!    Admit date: 07/27/2018 Discharge date: 07/29/2018   Discharge Diagnoses:      Active Hospital Problems   Diagnosis Date Noted  . Acute hypoxemic respiratory failure ([place])   . Polysubstance abuse ([place]) 07/28/2018  . COPD exacerbation ([place]) 04/22/2015  . Essential hypertension 01/10/2014  . Seizure disorder ([place]) 01/25/2012  . Chronic diastolic CHF (congestive heart failure) ([place]) 01/25/2012  . Cigarette smoker 06/04/2011        Chief Complaint:SOB  CZY:SAYTKZ G Barris a 63 y.o.femalewith medical history significant ofstage IV COPD,polysubstance abuse (cocaine abuse in remission, alcohol and tobacco),chronic hepatitis C, and hepatocellular carcinoma(s/p of ablation), RUL lung cancer (s/p of XRT, nochemo surgery), hypertension, depression, seizure, remote intracranial hemorrhage,who presents with shortness of breath.  Patient states that she started having worsening shortness of breath   which has been progressively getting worse. She denies cough, Nochest pain, fever or chills. No sick contact. No recent long distance traveling.She reports she has been on prednisone taper for the last 2 weeks. EMS notes the patient had tripoding and accessory muscle use on arrival. She was given 125 mg of Solu-Medrol in route. Patient does not have nausea vomiting, diarrhea, abdominal pain, symptoms of UTI or unilateral weakness.   ED Course:pt was  found to have WBC 9.8, electrolytes renal function okay, temperature normal, tachycardia, oxygen desaturation to 79% on ambulation, which improved to 97% on 2 L nasal cannula oxygen. Chest x-ray is negative for infiltration, showed emphysema and old LULnodules.Pt isplaced on telemetry bed of observation.  Hospital Course:  Principal Problem:   Acute hypoxemic respiratory failure (HCC) Active Problems:   Cigarette smoker   Seizure disorder (HCC)   Chronic diastolic CHF (congestive heart failure) ([place])   Essential hypertension  COPD exacerbation (HCC)   Polysubstance abuse ([place])   Acute on chronic respiratory failure with hypoxiadue toCOPD exacerbation: -Chest x-ray negative for infiltration.  -low risk for COVID 19 , denies sick contact, no fever, no cough -flu swab negative -she received iv solumedrol,  is feeling better and wants to go home, she agreed to home o2 -she is on oral prednisone daily at baseline prescribed by her pulmonologist Dr Melvyn Novas -she is discharged on slow prednisone taper, z pack, home o2, follow up with Dr Sonora Catlin  Polysubstance abuse(cocaine in remission, tobacco and alcohol abuse); -Did counseling about importance of quitting substance abuse -nicotine patch -CiWA protocol, no signs of alcohol withdrawal in the hospital -continuehome naltrexone  Seizure -Seizure precaution -When necessary Ativan for seizure -Continue Home medications:Keppra  HTN:  -Continue home medications:Metoprolol and spironolactone -bp stable on home meds  Chronic diastolic congestive heart failure: 2D echo on 04/25/2016 showed EF 60-65% with grade 2 diastolic dysfunction. Patient does not have leg edema JVD. CHF is compensated. - Continue spironolactone    Virtual Visit via Telephone Note 08/03/2018/post hosp f/u - confused with contingencies/ prns  History of Present Illness: Dyspnea:  On 2lpm walking around appt / still actively smoking  Cough:  none Sleeping: flat bed  2 pillows SABA use: proair 3 x daily /  Does not use neb / did not activate action plan prior to going to ER  02: 2 lpm 24/7, not monitoring sats   rec Goal for 02 delivery is to keep it above 90% by adjusting the flow up down or off as long as above 90%  Prednisone 10 mg 2 daily until better then 1 daily as maintenance therapy, if get worse, go back to 2 daily Plan A = Automatic = Bevespi Take 2 puffs first thing in am and then another 2 puffs about 12 hours later and prednisone 10mg  daily  Plan B = Backup Only use your albuterol inhaler as a rescue medication Plan C = Crisis - only use your albuterol nebulizer if you first try Plan B  Plan D = Deltasone 10mg  - if getting worse and having to use more albuterol, add extra dose of prednisone each  am  The key is to stop smoking completely before smoking completely stops you! -For smoking cessation  Info call  [phone]    Virtual Visit via Telephone Note 09/02/2018   I connected with Katherine Mantle on 09/02/18 at  2:30 PM EDT by telephone and verified that I am speaking with the correct person using two identifiers.   I discussed the limitations, risks, security and privacy concerns of performing an evaluation and management service by telephone and the availability of in person appointments. I also discussed with the patient that there may be a patient responsible charge related to this service. The patient expressed understanding and agreed to proceed.    History of Present Illness:  Severe copd / 2 cigs per day  Dyspnea:  Walking to get paper every day about half way to street  Cough: none Sleeping: flat bed/ on back ok 2 pillows  SABA use: up to 2 x daily  02: 2lpm rec We will send a paper copy of these recs Prednisone ceiling is 20 mg per day if doing poorly, taper to 5 mg daily (one half of a ten) if doing great.    Virtual Visit via Telephone Note 02/23/2019   I connected with Katherine Mantle on  02/23/19 at  8:20  by telephone  and verified that I am speaking with the correct person using two identifiers.   I discussed the limitations, risks, security and privacy concerns of performing an evaluation and management service by telephone and the availability of in person appointments. I also discussed with the patient that there may be a patient responsible charge related to this service. The patient expressed understanding and agreed to proceed.   History of Present Illness: copd/ quit smoking as of early oct 2020 / 02 dep hs only  Dyspnea:  Walks to road and back s stopping s 02 with sats low 90s Cough: none Sleeping: flat/ two thin pillows SABA use: maybe 3-4 hfa only  02: 2lpm hs only    No obvious day to day or daytime variability or assoc excess/ purulent sputum or mucus plugs or hemoptysis or cp or chest tightness, subjective wheeze or overt sinus or hb symptoms.    Also denies any obvious fluctuation of symptoms with weather or environmental changes or other aggravating or alleviating factors except as outlined above.   Meds reviewed/ med reconciliation completed        Observations/Objective: Sounds great full sentences/ no rattling when cough, good phonation    Assessment and Plan: See problem list for active a/p's   Follow Up Instructions: See avs for instructions unique to this ov which includes revised/ updated med list     I discussed the assessment and treatment plan with the patient. The patient was provided an opportunity to ask questions and all were answered. The patient agreed with the plan and demonstrated an understanding of the instructions.   The patient was advised to call back or seek an in-person evaluation if the symptoms worsen or if the condition fails to improve as anticipated.  I provided 25  minutes of non-face-to-face time during this encounter.   Christinia Gully, MD

## 2019-02-23 NOTE — Assessment & Plan Note (Addendum)
2lpm since d/c 07/29/2018 for aecopd   Apparently taking off 02 and sats ok at rest.  Emphasized goal is to keep sats > 90% at all times on or off 02, with or without exertion but always wear at hs    Each maintenance medication was reviewed in detail including most importantly the difference between maintenance and as needed and under what circumstances the prns are to be used.  Please see AVS for specific  Instructions which are unique to this visit and I personally typed out  which were reviewed in detail over the phone with the patient and sister and a copy provided of avs by mail.

## 2019-02-23 NOTE — Assessment & Plan Note (Signed)
Quit smoking as of early Oct 2020   - trial of Anoro  05/18/15 > better doe  06/01/2015  - Spirometry 06/01/2015  FEV1 0.54 (19%)  Ratio 39   - PFT's  07/24/2015  FEV1 0.65 (21 % ) ratio 29  p no % improvement from saba p anoro am prior to study with DLCO  12 % corrects to 23 % for alv volume   - 06/17/17 changed to bevespi due insurance - prednisone 20 ub then 10 maint added to rx 02/23/2018  04/30/2018  After extensive coaching inhaler device,  effectiveness =  75% with Bevespi (short Ti ) - reset floor for prednisone at 5 mg daily 09/02/2018      Sounds much better compensated since quit smoking   The goal with a chronic steroid dependent illness is always arriving at the lowest effective dose that controls the disease/symptoms and not accepting a set "formula" which is based on statistics or guidelines that don't always take into account patient  variability or the natural hx of the dz in every individual patient, which may well vary over time.  For now therefore I recommend the patient maintain  20 mg ceiling and 5 mg floor   >>> f/u in 6 m if doing better, if not ov with all meds/portable 02 system in hand using a trust but verify approach to confirm accurate Medication  Reconciliation The principal here is that until we are certain that the  patients are doing what we've asked, it makes no sense to ask them to do more.

## 2019-02-23 NOTE — Patient Instructions (Addendum)
Goal for oxygen is to keep it above 90% on or off 02 and with or without exertion.  Congratulations on stopping smoking   Prednisone  10 mg x 2 daily when worse wean to just one half when better   If doing better, return in 6 months, call sooner if not happy with breathing but you must return with all medications in hand and your portable system   Will need avs sent by mail

## 2019-03-25 IMAGING — MG DIGITAL SCREENING BILATERAL MAMMOGRAM WITH TOMO AND CAD
8 series · 9 of 24 positions shown · non-contrast
Comparison: Previous exam(s).

CLINICAL DATA: Screening.

EXAM:
DIGITAL SCREENING BILATERAL MAMMOGRAM WITH TOMO AND CAD

[R MLO synth-2D]
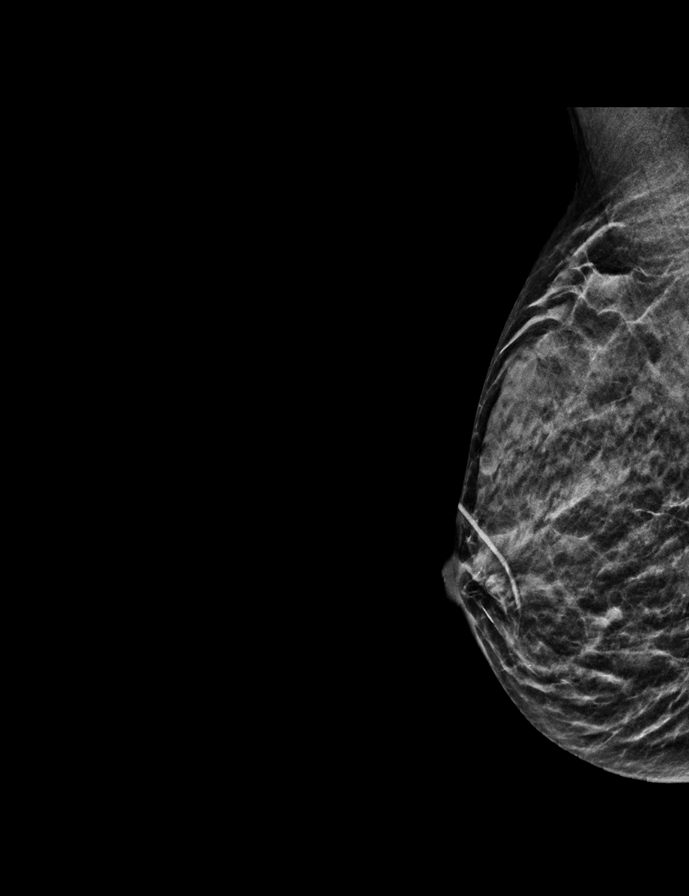

[L MLO synth-2D]
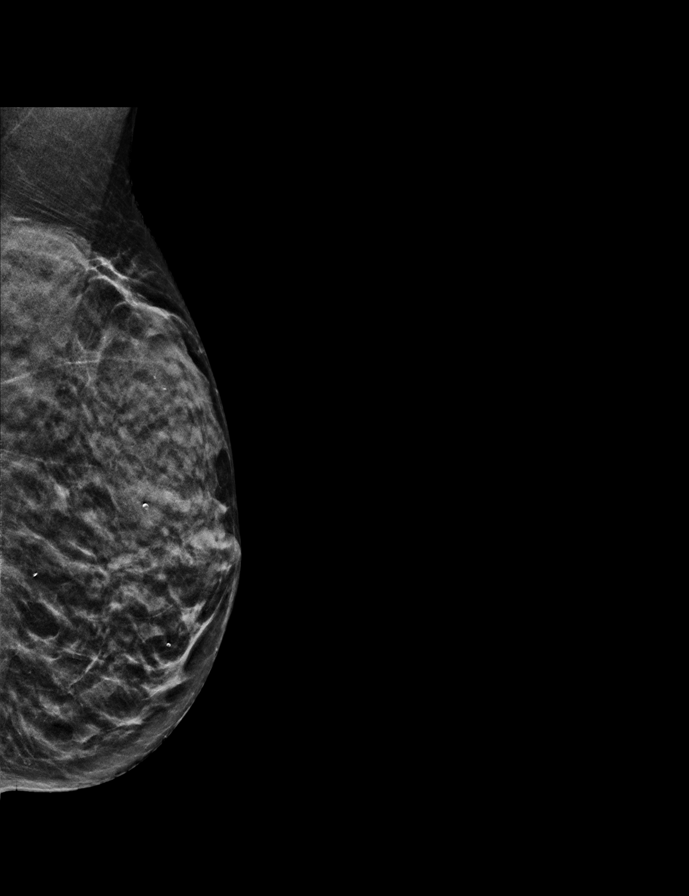

[L CC synth-2D]
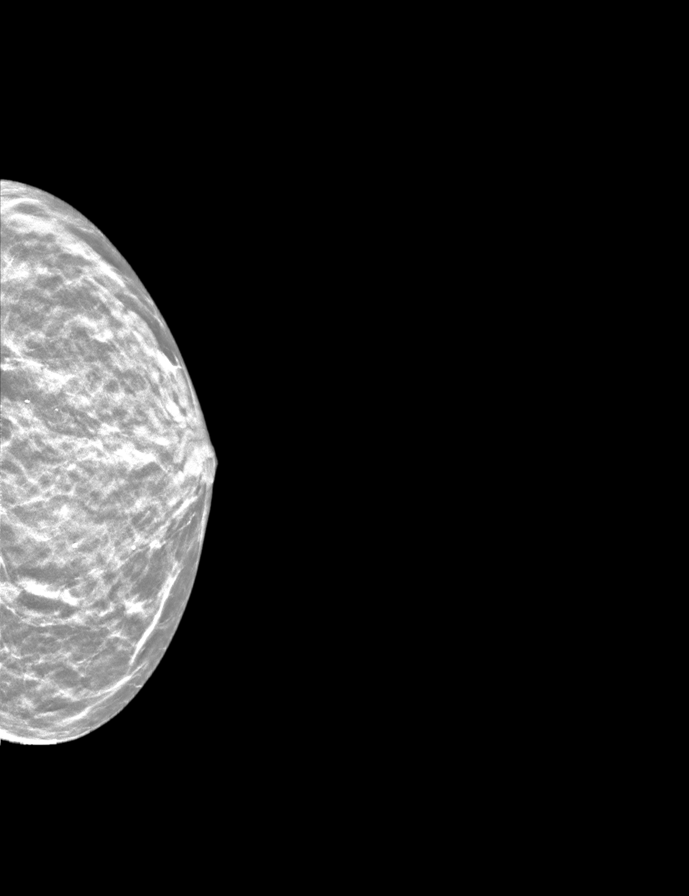

[R CC synth-2D]
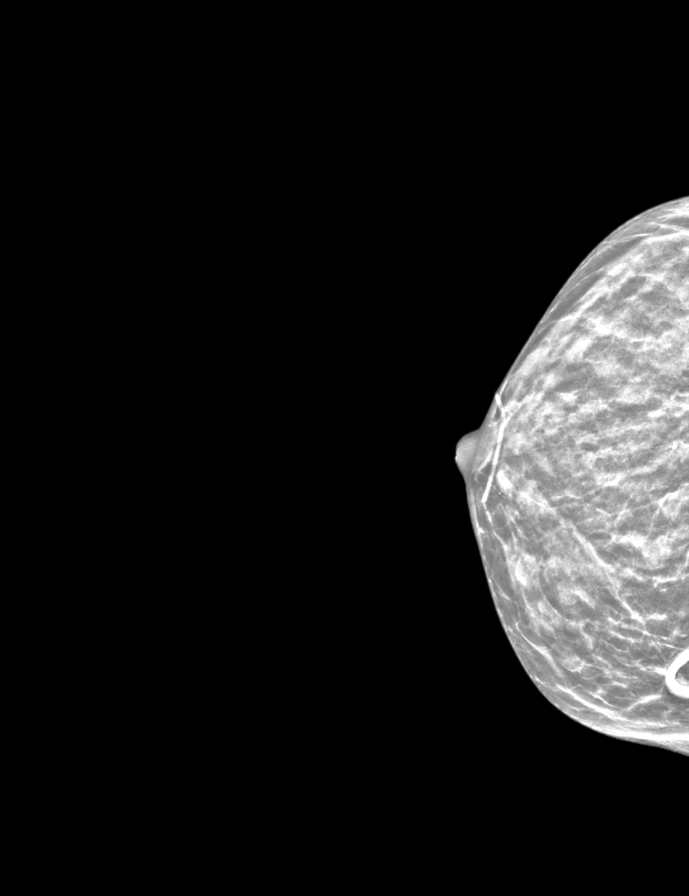

[R MLO tomo · 2 of 30 frames shown]
[frame 10/30]
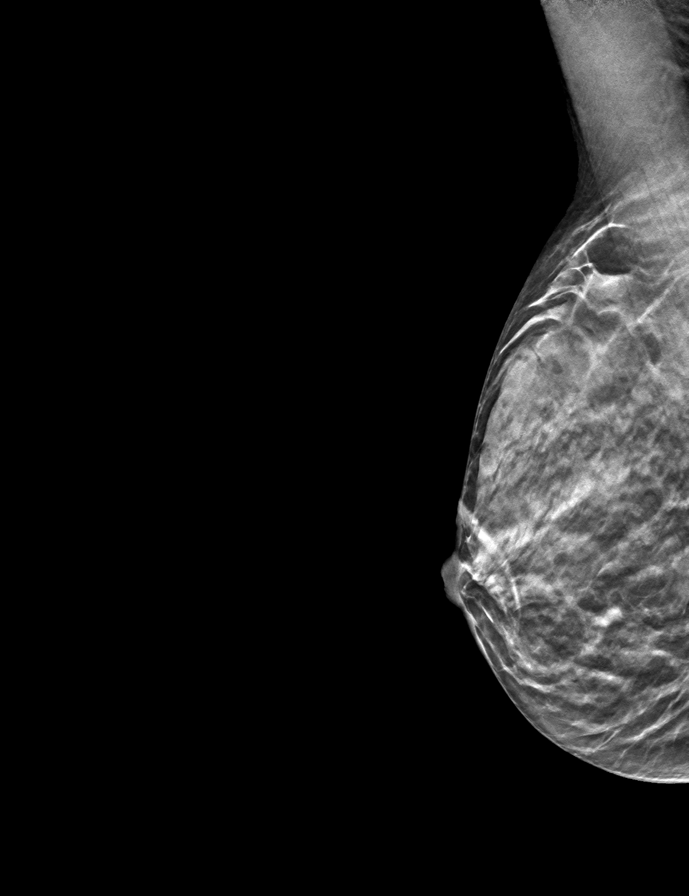
[frame 15/30]
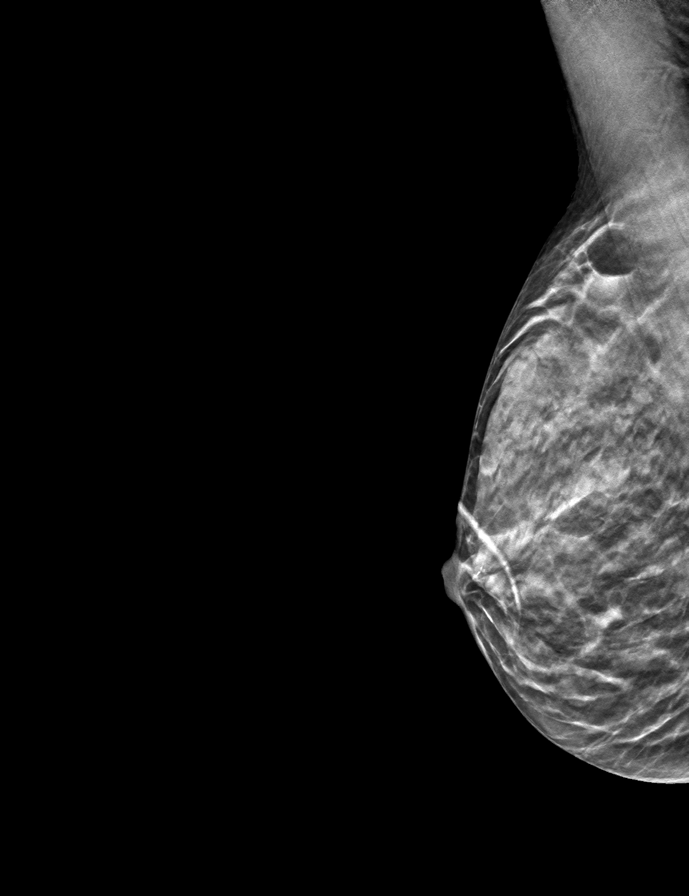

[L CC tomo · tomo slice 16/31.0]
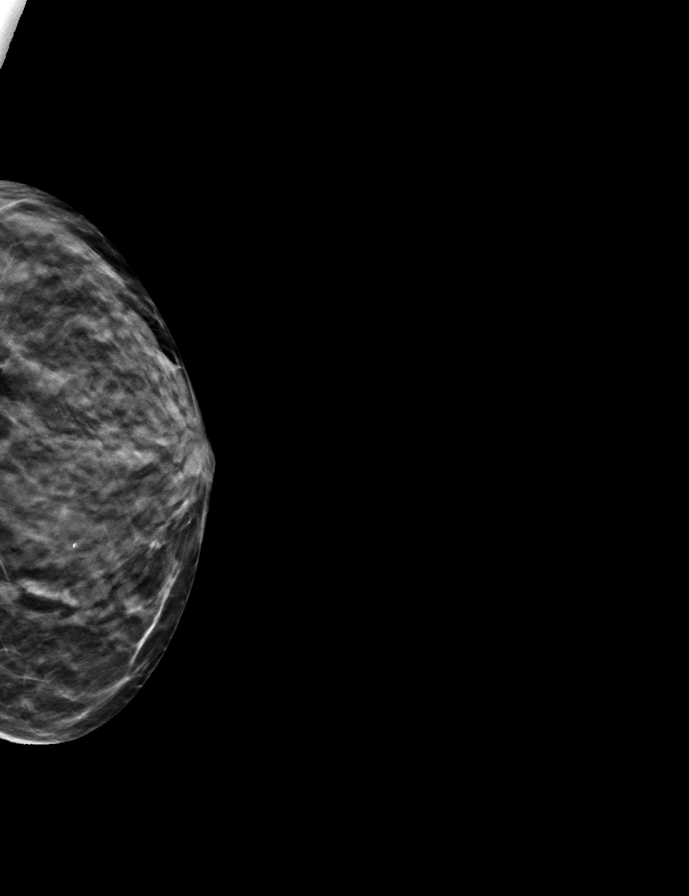

[R CC tomo · tomo slice 15/30.0]
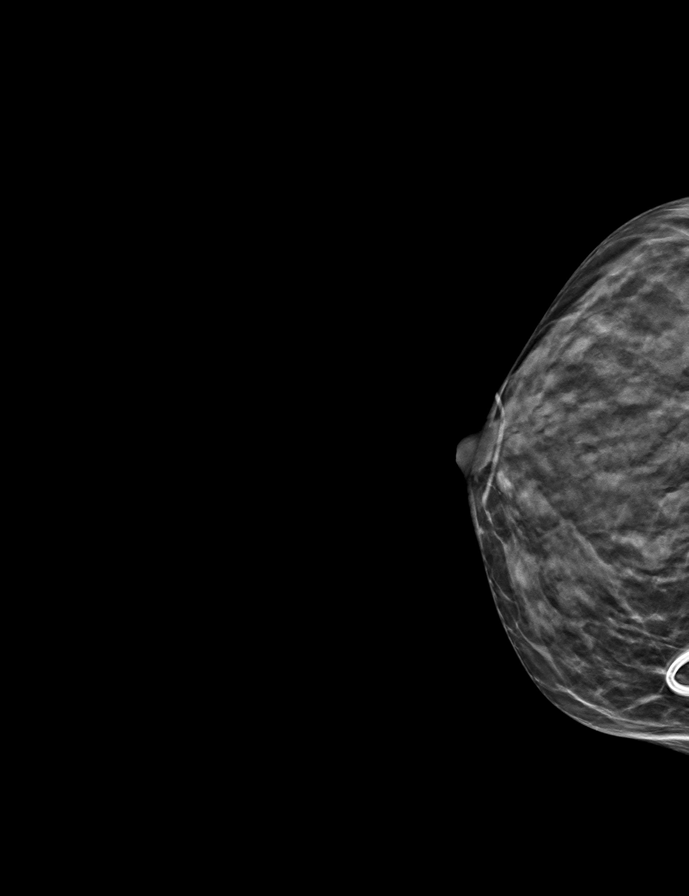

[L MLO tomo · tomo slice 17/33.0]
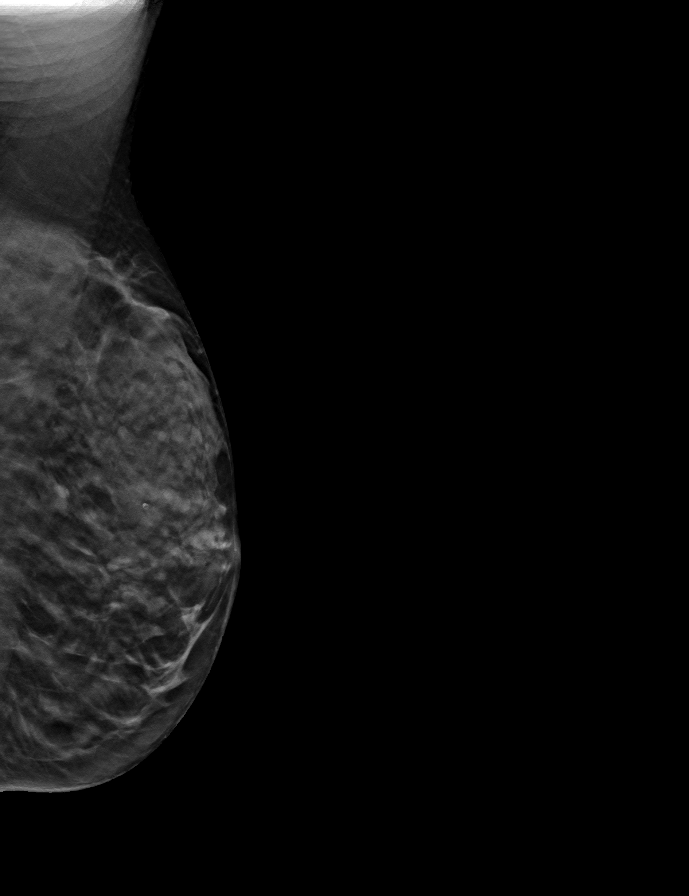

[9 of 24 positions shown; findings below may reference images not displayed]

ACR Breast Density Category c: The breast tissue is heterogeneously
dense, which may obscure small masses.
FINDINGS: There are no findings suspicious for malignancy. Images were
processed with CAD.
IMPRESSION: No mammographic evidence of malignancy. A result letter of this
screening mammogram will be mailed directly to the patient.

RECOMMENDATION:
Screening mammogram in one year. (Code:FT-U-LHB)

BI-RADS CATEGORY  1: Negative.

## 2019-04-22 IMAGING — CR CHEST - 2 VIEW
2 series · 2 of 2 positions shown · non-contrast
Comparison: Chest radiograph dated 02/23/2018 and CT dated
06/25/2016

CLINICAL DATA: 62-year-old female with shortness of breath. History
of lung cancer.

EXAM:
CHEST - 2 VIEW

[chest pa]
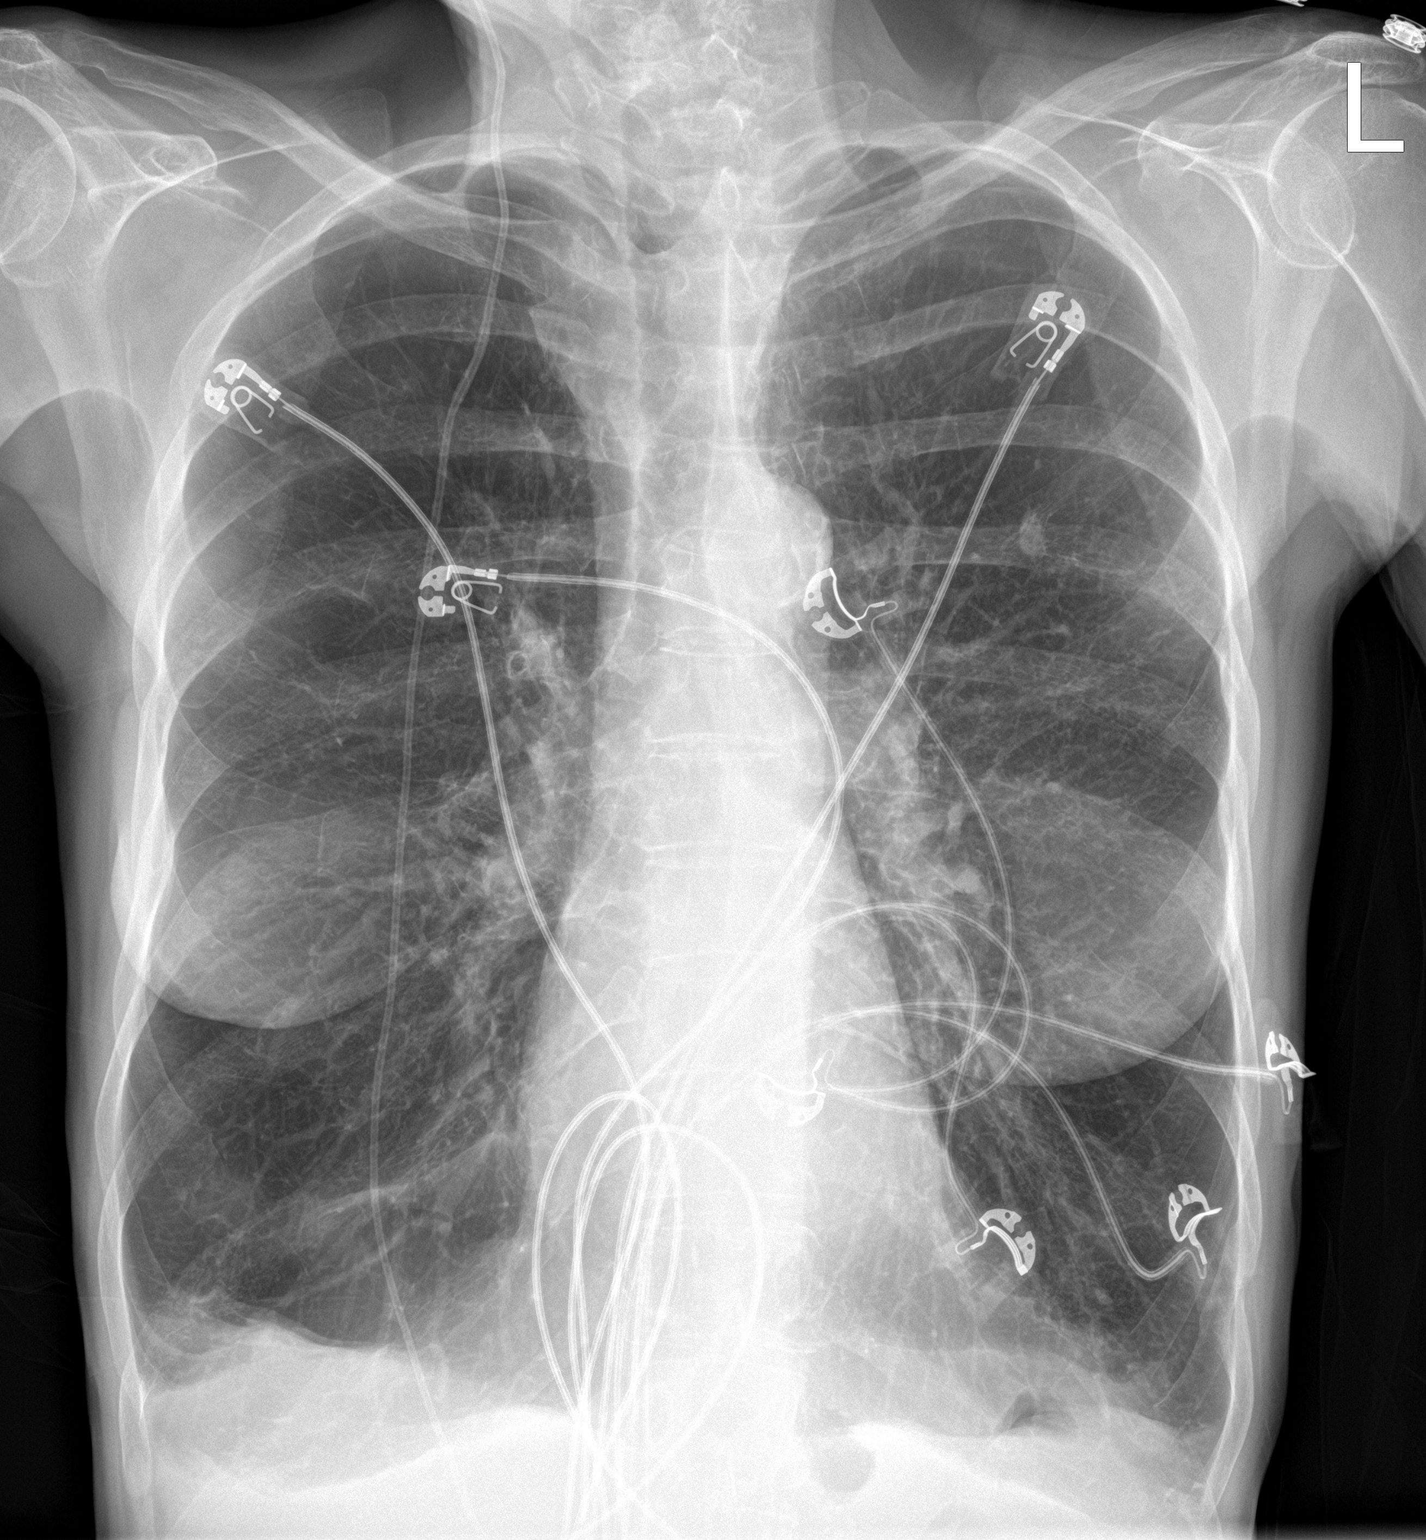

[chest lat]
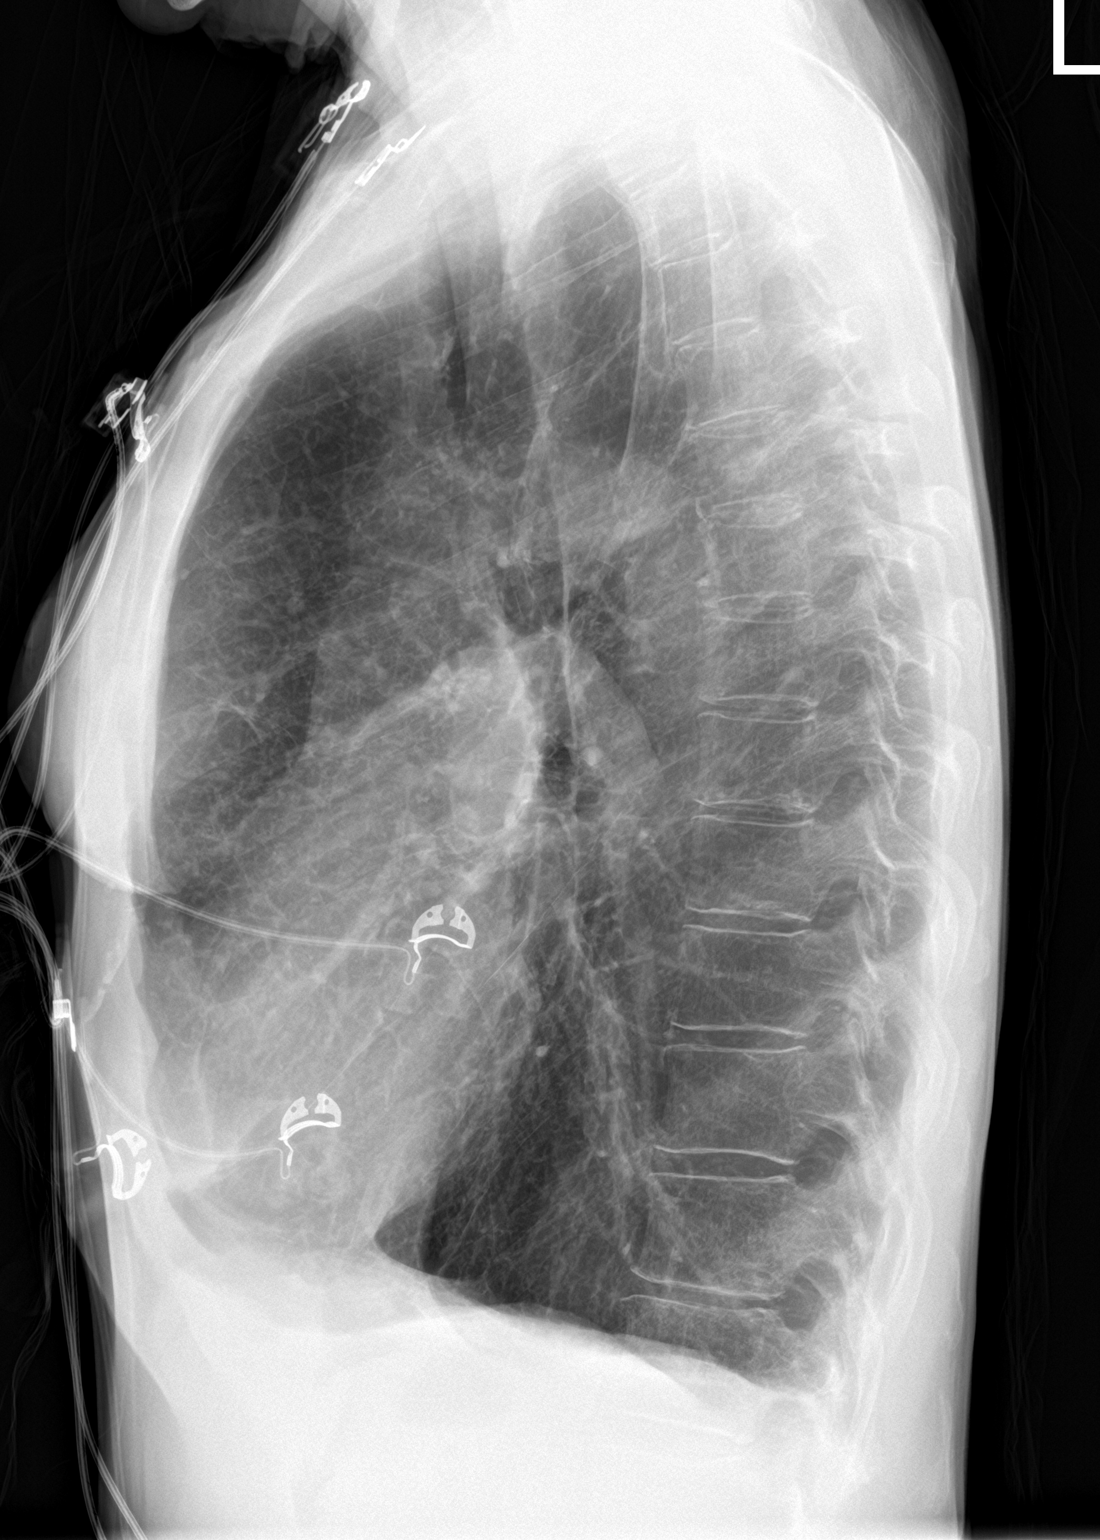

[2 of 2 positions shown; findings below may reference images not displayed]

FINDINGS: There is emphysematous changes of the lungs with flattening of the
diaphragms. There is an 11 mm left upper lobe nodule as seen on the
prior CT and radiograph. No focal consolidation, pleural effusion,
or pneumothorax. The cardiac silhouette is within normal limits.
Atherosclerotic calcification of the aortic arch. No acute osseous
pathology. Partially visualized right-sided VP shunt.
IMPRESSION: 1. No acute cardiopulmonary process.
2. Emphysema.
3. Left upper lobe nodule and seen on the prior studies.

## 2019-05-31 ENCOUNTER — Other Ambulatory Visit: Payer: Self-pay | Admitting: Family Medicine

## 2019-05-31 DIAGNOSIS — Z1231 Encounter for screening mammogram for malignant neoplasm of breast: Secondary | ICD-10-CM

## 2019-06-21 IMAGING — DX PORTABLE CHEST - 1 VIEW
2 series · 2 of 2 positions shown · non-contrast
Comparison: Radiographs 07/27/2018 and 08/18/2017.

CLINICAL DATA: One-day history of gradually worsening shortness of
breath. History of COPD.

EXAM:
PORTABLE CHEST 1 VIEW

[chest ap (1 of 2)]
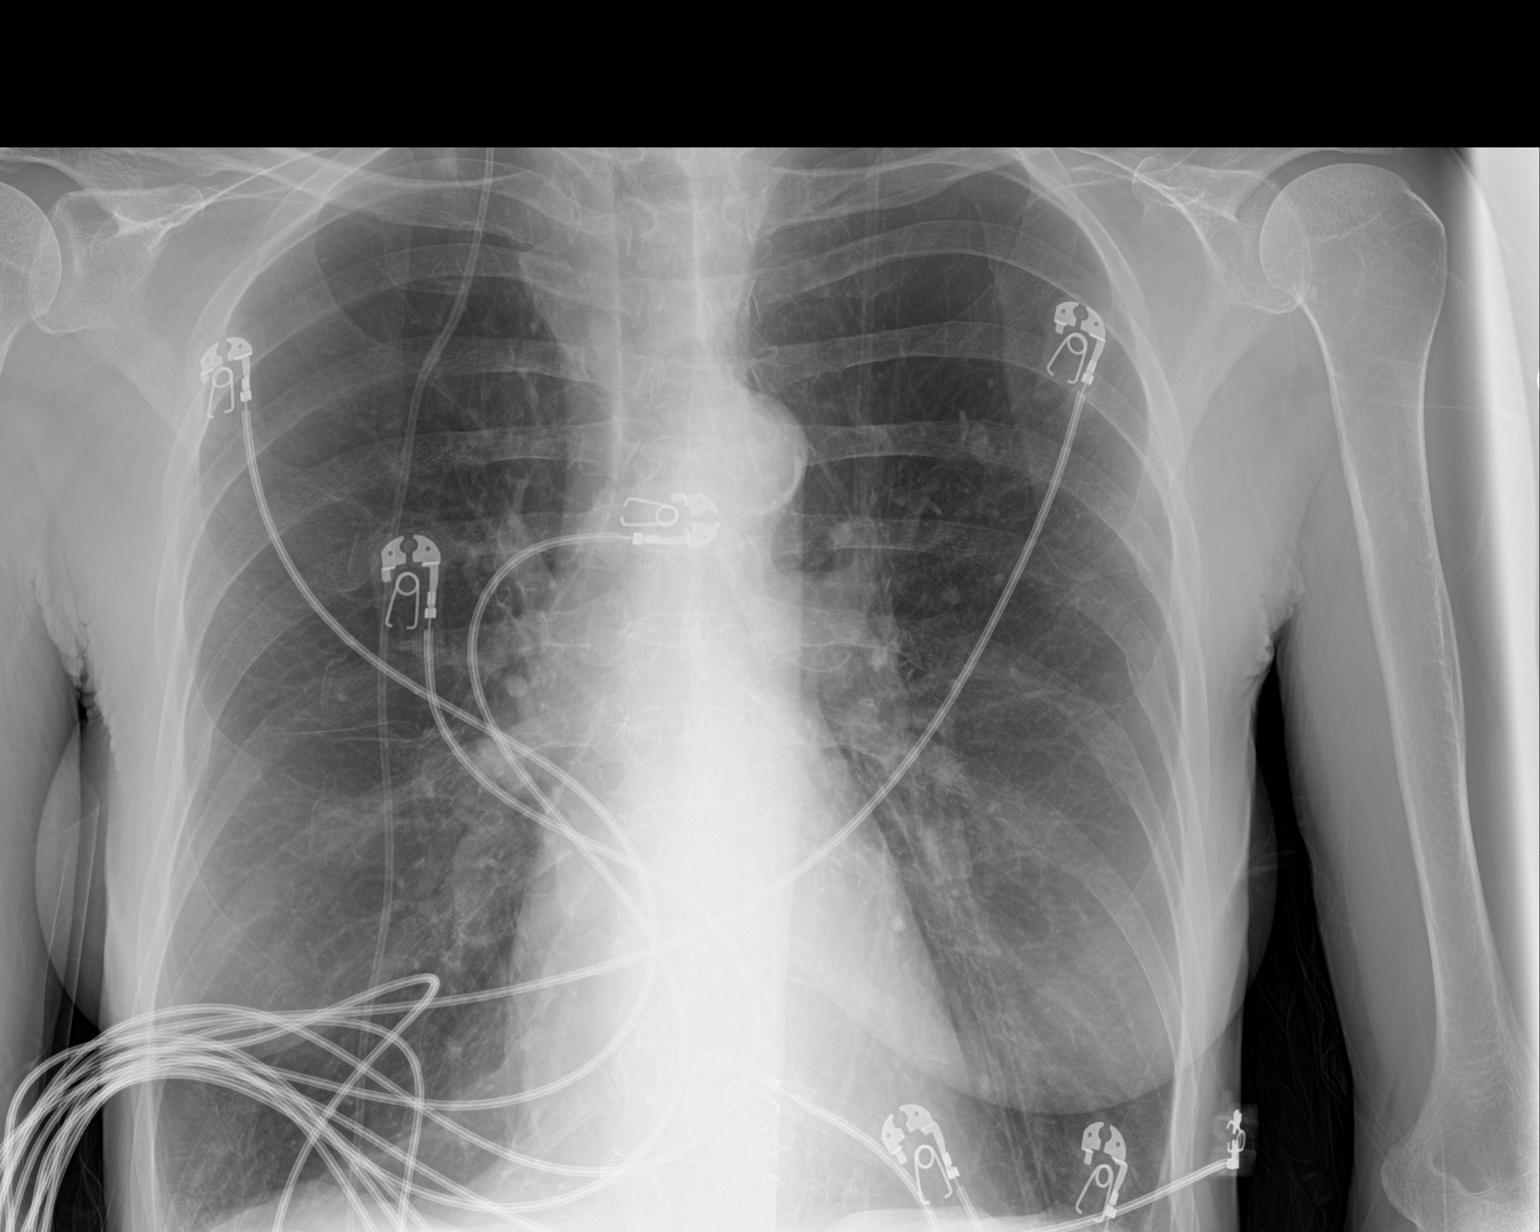

[chest ap (2 of 2)]
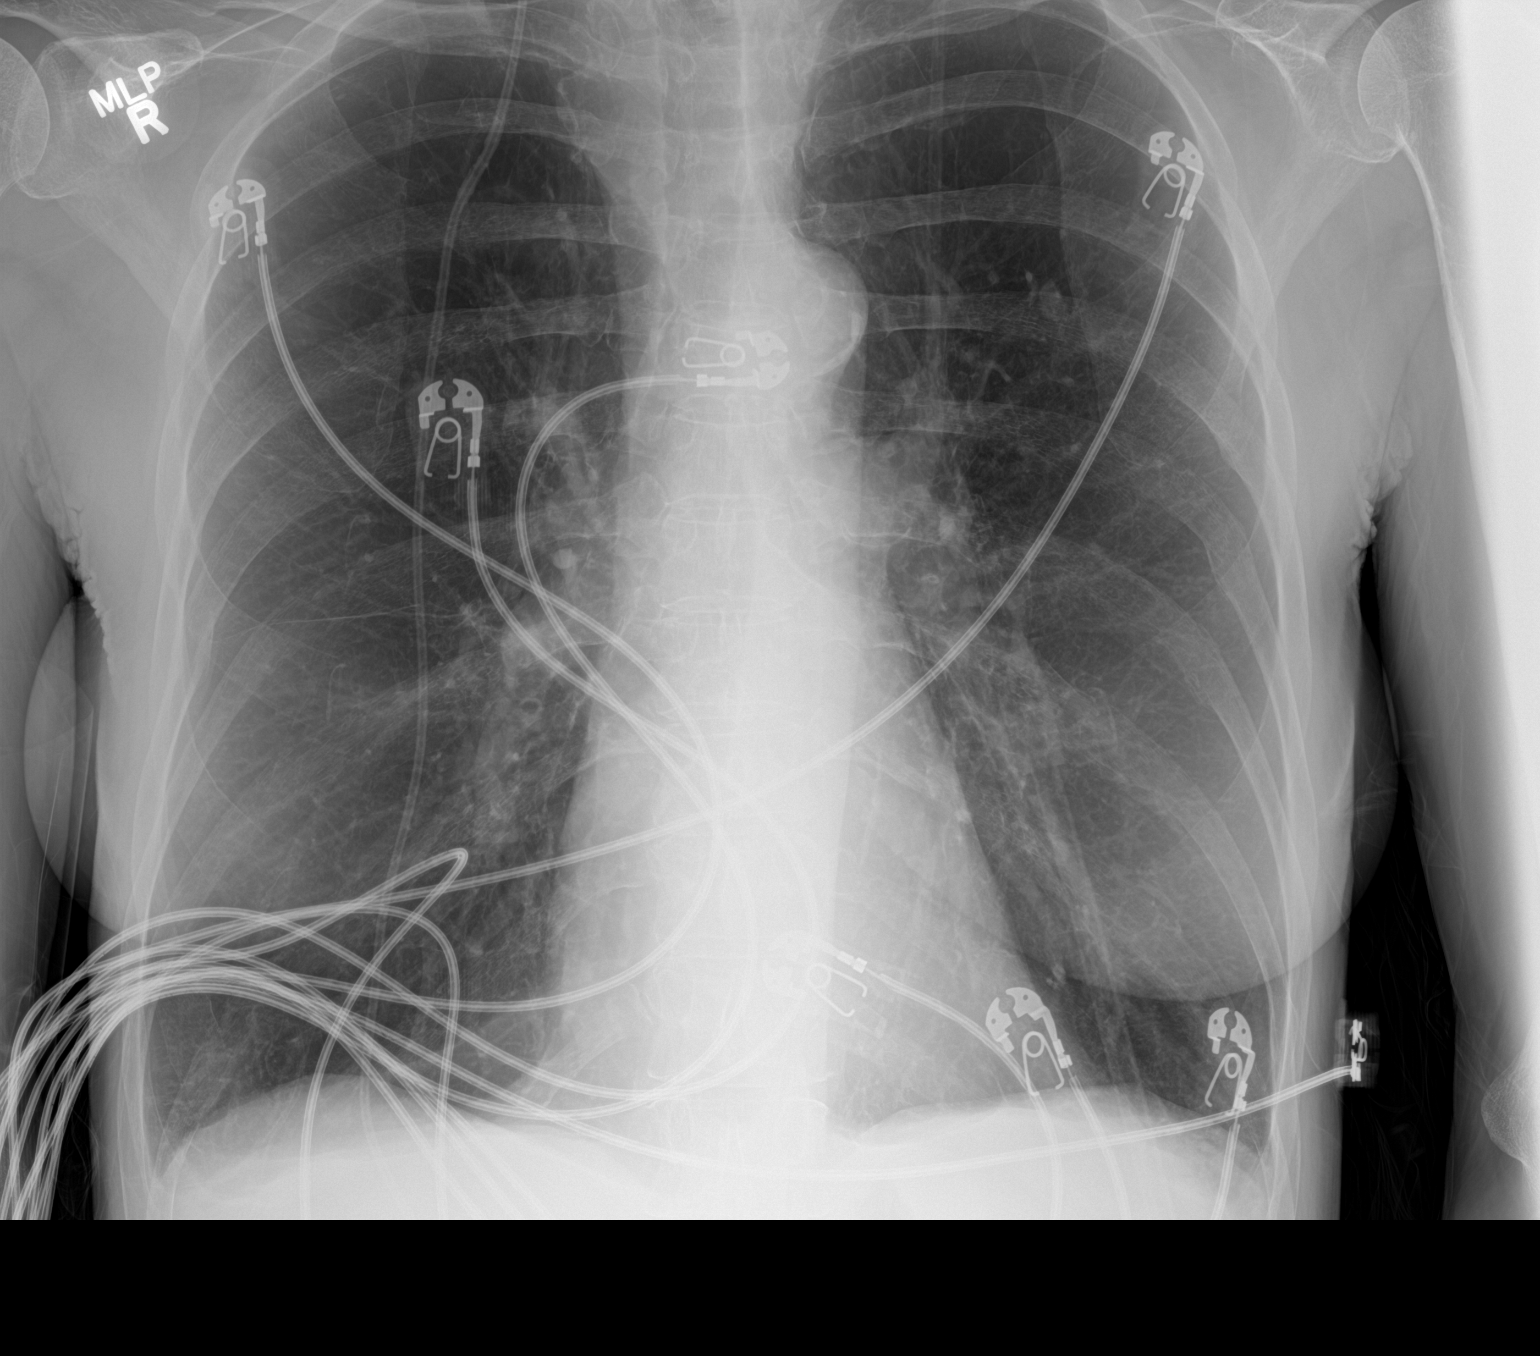

[2 of 2 positions shown; findings below may reference images not displayed]

FINDINGS: 2222 hours. Two views obtained. The heart size and mediastinal
contours are stable with aortic atherosclerosis. The lungs are
hyperinflated with stable nodular scarring in both upper lobes.
There is no confluent airspace opacity, edema or suspicious
nodularity. There is no pleural effusion or pneumothorax. Old rib
fractures are present bilaterally. Ventricular peritoneal shunt
overlies the right chest.
IMPRESSION: Stable chest with sequela of chronic obstructive pulmonary disease.
No acute cardiopulmonary process.

## 2019-07-09 ENCOUNTER — Ambulatory Visit
Admission: RE | Admit: 2019-07-09 | Discharge: 2019-07-09 | Disposition: A | Discharge status: Home / Self Care | Payer: Medicaid Other | Source: Ambulatory Visit | Attending: Family Medicine | Admitting: Family Medicine

## 2019-07-09 ENCOUNTER — Other Ambulatory Visit: Payer: Self-pay

## 2019-07-09 DIAGNOSIS — Z1231 Encounter for screening mammogram for malignant neoplasm of breast: Secondary | ICD-10-CM

## 2019-07-15 ENCOUNTER — Ambulatory Visit: Payer: Medicaid Other

## 2019-07-28 IMAGING — DX PORTABLE CHEST - 1 VIEW
1 series · 1 of 1 positions shown · non-contrast
Comparison: 09/25/2018

CLINICAL DATA: Fall, right rib pain

EXAM:
PORTABLE CHEST 1 VIEW

[chest ap]
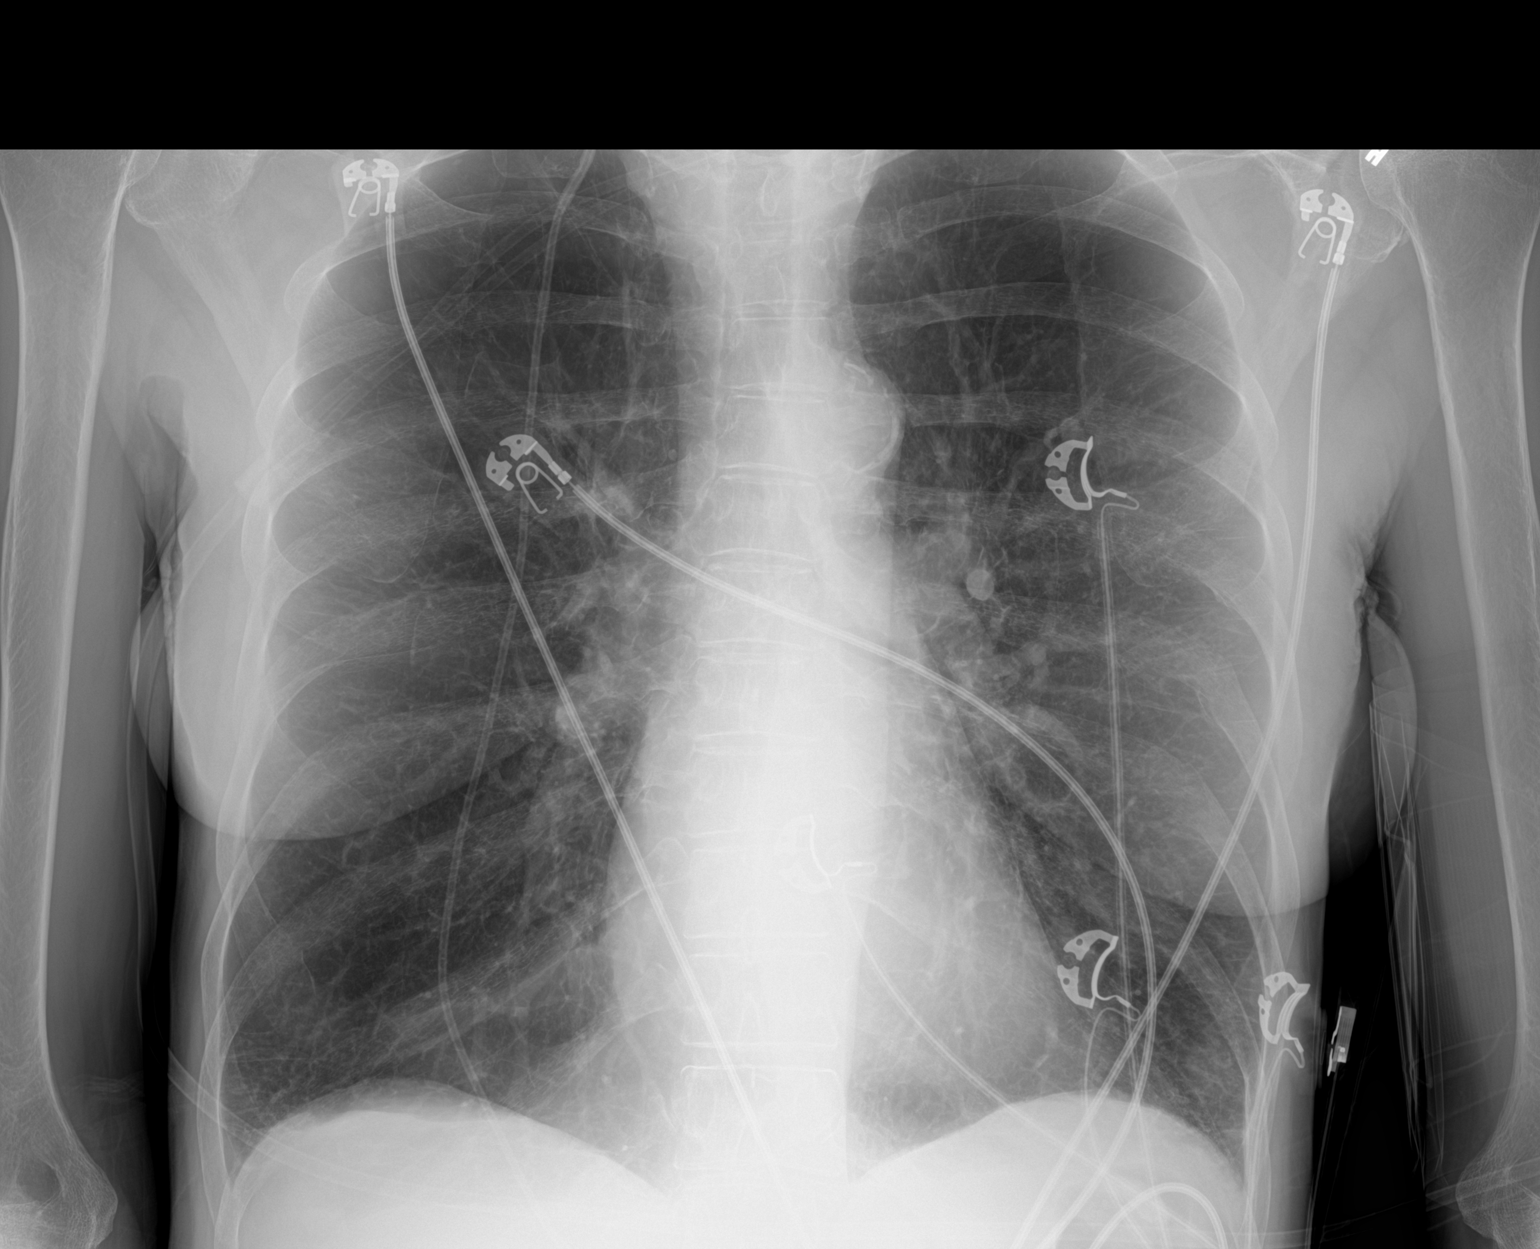

[1 of 1 positions shown; findings below may reference images not displayed]

FINDINGS: Stable hyperinflation compatible with COPD/emphysema. Normal heart
size and vascularity. No focal pneumonia, collapse or consolidation.
Negative for edema, effusion or pneumothorax. Trachea midline. Aorta
atherosclerotic. Old posterolateral left rib fractures.
IMPRESSION: Stable hyperinflation.  No interval change or acute chest process.

## 2019-10-11 ENCOUNTER — Other Ambulatory Visit: Payer: Self-pay | Admitting: Internal Medicine

## 2019-10-11 ENCOUNTER — Telehealth: Payer: Self-pay | Admitting: Internal Medicine

## 2019-10-11 MED ORDER — BEVESPI AEROSPHERE 9-4.8 MCG/ACT IN AERO: INHALATION_SPRAY | RESPIRATORY_TRACT | 2 refills | Status: DC

## 2019-10-11 NOTE — Telephone Encounter (Signed)
Pt requesting 90 day supply of Bevespi.  This has been sent to preferred pharmacy.  Nothing further needed at this time- will close encounter.

## 2020-02-21 ENCOUNTER — Encounter: Payer: Self-pay | Admitting: Pulmonary Disease

## 2020-02-21 ENCOUNTER — Other Ambulatory Visit: Payer: Self-pay

## 2020-02-21 ENCOUNTER — Ambulatory Visit (INDEPENDENT_AMBULATORY_CARE_PROVIDER_SITE_OTHER): Payer: Medicaid Other | Admitting: Pulmonary Disease

## 2020-02-21 VITALS — BP 120/74 | HR 104 | Temp 97.4°F | Ht 68.0 in | Wt 106.6 lb

## 2020-02-21 DIAGNOSIS — J9611 Chronic respiratory failure with hypoxia: Secondary | ICD-10-CM

## 2020-02-21 DIAGNOSIS — E43 Unspecified severe protein-calorie malnutrition: Secondary | ICD-10-CM | POA: Diagnosis not present

## 2020-02-21 DIAGNOSIS — Z87891 Personal history of nicotine dependence: Secondary | ICD-10-CM

## 2020-02-21 DIAGNOSIS — J449 Chronic obstructive pulmonary disease, unspecified: Secondary | ICD-10-CM

## 2020-02-21 DIAGNOSIS — Z0000 Encounter for general adult medical examination without abnormal findings: Secondary | ICD-10-CM | POA: Insufficient documentation

## 2020-02-21 NOTE — Assessment & Plan Note (Signed)
Plan: Continue oxygen therapy 

## 2020-02-21 NOTE — Progress Notes (Signed)
@Patient  ID: Wanda Prince, female    DOB: Oct 09, 1955, 64 y.o.   MRN: [id number]  Chief Complaint  Patient presents with  . Follow-up    COPD, doing well, does get winded easily.    Referring provider: Katherina Mires, MD  HPI:  64 year old female former smoker followed in our office for COPD and chronic respiratory failure  PMH: History of cigarette smoker, hypertension, alcohol abuse, history of seizures, polysubstance abuse, hepatocellular carcinoma Smoker/ Smoking History: Former smoker.  Quit 2020.  40-pack-year smoking history. Maintenance: Bevespi Pt of: Dr. Melvyn Novas  02/21/2020  - Visit   64 year old female former smoker followed in our office for COPD Gold stage IV and chronic respiratory failure.  Patient completing a 1 year follow-up with her office today.  She was last seen by Dr. Melvyn Novas in October/2020.  Overall patient feels that breathing has been stable since last visit.  She has not needed additional rounds of prednisone since stopping smoking around 1-1/2 years ago.  She remains adherent to Bevespi.  She gets short of breath with physical exertion.  She is struggling with her appetite.  She is utilizing supplemental meal such as Ensure, boost or Carnation breakfast.  Denies any recent antibiotics.  Patient is up-to-date with her COVID-19 vaccinations as well as with her flu vaccine.   Questionaires / Pulmonary Flowsheets:   ACT:  No flowsheet data found.  MMRC: mMRC Dyspnea Scale mMRC Score  02/21/2020 3    Epworth:  No flowsheet data found.  Tests:   11/01/2018-CT chest with contrast-advanced emphysema, stable bilateral upper lobe pulmonary nodules, no displaced rib fracture or focal chest abnormality  07/24/2015-pulmonary function test- FVC 2.19 (56% predicted), postbronchodilator ratio 29, postbronchodilator FEV1 0.65 (21% predicted), no bronchodilator response, DLCO 3.66 (12% predicted)  04/25/2016-echocardiogram-LV ejection fraction 60 to 65%, grade 2  diastolic dysfunction, PA pressure 36  FENO:  No results found for: NITRICOXIDE  PFT: PFT Results Latest Ref Rng & Units 07/24/2015  FVC-Pre L 2.19  FVC-Predicted Pre % 56  FVC-Post L 2.22  FVC-Predicted Post % 57  Pre FEV1/FVC % % 30  Post FEV1/FCV % % 29  FEV1-Pre L 0.66  FEV1-Predicted Pre % 21  FEV1-Post L 0.65  DLCO uncorrected ml/min/mmHg 3.66  DLCO UNC% % 12  DLVA Predicted % 23  TLC L 7.22  TLC % Predicted % 127  RV % Predicted % 217    WALK:  No flowsheet data found.  Imaging: No results found.  Lab Results:  CBC    Component Value Date/Time   WBC 11.6 (H) 11/01/2018 0952   RBC 4.59 11/01/2018 0952   HGB 14.6 11/01/2018 0952   HCT 47.0 (H) 11/01/2018 0952   PLT 137 (L) 11/01/2018 0952   MCV 102.4 (H) 11/01/2018 0952   MCH 31.8 11/01/2018 0952   MCHC 31.1 11/01/2018 0952   RDW 13.7 11/01/2018 0952   LYMPHSABS 1.9 11/01/2018 0952   MONOABS 0.8 11/01/2018 0952   EOSABS 0.1 11/01/2018 0952   BASOSABS 0.0 11/01/2018 0952    BMET    Component Value Date/Time   NA 142 11/01/2018 0952   K 4.0 11/01/2018 0952   CL 100 11/01/2018 0952   CO2 34 (H) 11/01/2018 0952   GLUCOSE 88 11/01/2018 0952   BUN 12 11/01/2018 0952   BUN 10.3 11/03/2015 1140   CREATININE 0.63 11/01/2018 0952   CREATININE 0.7 11/03/2015 1140   CALCIUM 8.8 (L) 11/01/2018 0952   GFRNONAA >60 11/01/2018 0952   GFRAA >  60 11/01/2018 0952    BNP No results found for: BNP  ProBNP    Component Value Date/Time   PROBNP 164.6 (H) 01/10/2014 1048    Specialty Problems      Pulmonary Problems   Acute respiratory failure with hypoxia and hypercapnia (HCC)   COPD with acute exacerbation (HCC)   COPD exacerbation (HCC)   Acute hypoxemic respiratory failure (HCC)   Acute respiratory failure (HCC)   Cavitating mass in right upper lung lobe    See ct 05/05/15  - PET 06/22/2015 1. Hypermetabolic RIGHT upper lobe nodule is consistent with bronchogenic carcinoma. Morphologically lesion  suggest squamous cell carcinoma. 2. Mildly hypermetabolic nodule of the LEFT lung base which is decreased slightly in size in short interval favors infectious or inflammatory process. 3. No mediastinal lymphadenopathy metastatic mild hypermetabolic mediastinal adenopathy. - Quant Gold 07/24/2015 > POS > referred to HD > afb neg smear/cultures collected 07/29/15 x 3 faxed to pulmo  09/22/15  >  09/26/2015  referred to Dr Lamonte Sakai ? Navigational bx with afb stain/culture before considering RT?  - CT 11/03/15 > no change in RUL lesion > F/u Dr Lisbeth Renshaw > never RT'd - cxr 08/18/2017 = no change  - cxr 02/23/2018 no change on L , R not viz          COPD  GOLD IV     Quit smoking as of early Oct 2020   - trial of Anoro  05/18/15 > better doe  06/01/2015  - Spirometry 06/01/2015  FEV1 0.54 (19%)  Ratio 39   - PFT's  07/24/2015  FEV1 0.65 (21 % ) ratio 29  p no % improvement from saba p anoro am prior to study with DLCO  12 % corrects to 23 % for alv volume   - 06/17/17 changed to bevespi due insurance - prednisone 20 ub then 10 maint added to rx 02/23/2018  04/30/2018  After extensive coaching inhaler device,  effectiveness =  75% with Bevespi (short Ti ) - reset floor for prednisone at 5 mg daily 09/02/2018              Primary cancer of right upper lobe of lung ([place])   DOE (dyspnea on exertion)   Chronic respiratory failure with hypercapnia ([place])    HC03 06/18/17 = 35       Chronic respiratory failure with hypoxia ([place])    2lpm since d/c 07/29/2018 for aecopd          No Known Allergies  Immunization History  Administered Date(s) Administered  . Hepatitis B, ped/adol 04/24/2012, 06/05/2012, 10/20/2012  . Influenza Inj Mdck Quad Pf 01/17/2016  . Influenza Split 01/26/2012, 03/30/2015, 04/15/2017  . Influenza, Seasonal, Injecte, Preservative Fre 01/11/2014, 02/10/2014, 02/14/2015  . Influenza,Quad,Nasal, Live 01/26/2012  . Influenza,inj,Quad PF,6+ Mos 01/11/2014, 01/17/2016, 02/11/2016, 02/10/2017,  01/29/2018  . Influenza,inj,quad, With Preservative 01/17/2016  . Influenza-Unspecified 02/17/2013, 01/17/2016  . PFIZER SARS-COV-2 Vaccination 07/14/2019, 08/04/2019  . Pneumococcal Conjugate-13 10/20/2013  . Pneumococcal Polysaccharide-23 02/25/2012, 01/11/2014  . Tdap 03/13/2015    Past Medical History:  Diagnosis Date  . 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 COPD (chronic obstructive pulmonary disease) ([place])   . H/O tracheostomy 04/22/2015   respiratory destress   . History of acute alcoholic hepatitis   . History of alcohol abuse   . History of cocaine abuse ([place])   .  History of iron deficiency   . Hx of ventricular shunt 2009  . Hypertension   . Left breast mass   . Major depressive disorder    History of  . Meningitis 2009  . Primary cancer of right upper lobe of lung ([place]) 07/12/2015  . Psychiatric diagnosis    Multiple  . Respiratory arrest ([place])    secondary to Subependymal abscess  . Seizure disorder ([place])   . Seizures ([place])   . Suicide attempt [employer])    History of suicide attempts August 2006 and July 2006    Tobacco History: Social History   Tobacco Use  Smoking Status Former Smoker  . Packs/day: 1.00  . Years: 40.00  . Pack years: 40.00  . Types: Cigarettes  . Quit date: 2020  . Years since quitting: 1.8  Smokeless Tobacco Never Used   Counseling given: No   Continue to not smoke  Outpatient Encounter Medications as of 02/21/2020  Medication Sig  . acetaminophen (TYLENOL) 500 MG tablet Take 1,000 mg by mouth every 6 (six) hours as needed for pain.  Marland Kitchen albuterol (PROVENTIL HFA;VENTOLIN HFA) 108 (90 BASE) MCG/ACT inhaler Inhale 2 puffs into the lungs every 4 (four) hours as needed for wheezing or shortness of breath.   Marland Kitchen albuterol (PROVENTIL) (2.5 MG/3ML) 0.083% nebulizer solution USE 1 VIAL IN NEBULIZER EVERY 4  HOURS AS NEEDED FOR WHEEZING OR SHORTNESS OF BREATH  . aspirin EC 81 MG tablet Take 81 mg by mouth daily.  Marland Kitchen b complex vitamins tablet Take 1 tablet by mouth 2 (two) times daily.  Marland Kitchen BEVESPI AEROSPHERE 9-4.8 MCG/ACT AERO INHALE 2 PUFFS INTO THE LUNGS 2 TIMES A DAY  . Cholecalciferol 50 MCG (2000 UT) TABS Take 1 tablet by mouth daily.  . cyclobenzaprine (FLEXERIL) 5 MG tablet Take 5 mg by mouth 2 (two) times daily as needed.  . feeding supplement, ENSURE ENLIVE, (ENSURE ENLIVE) LIQD Take 237 mLs by mouth 2 (two) times daily between meals. (Patient taking differently: Take 237 mLs by mouth 3 (three) times daily between meals. )  . folic acid (FOLVITE) 1 MG tablet Take 1 mg by mouth daily.  Marland Kitchen gabapentin (NEURONTIN) 300 MG capsule Take 600 mg by mouth at bedtime.   . levETIRAcetam (KEPPRA) 500 MG tablet Take 1 tablet (500 mg total) by mouth 2 (two) times daily.  . metoprolol succinate (TOPROL XL) 25 MG 24 hr tablet Take 25 mg by mouth daily.   . Multiple Vitamin (MULITIVITAMIN WITH MINERALS) TABS Take 1 tablet by mouth daily.  . naltrexone (DEPADE) 50 MG tablet Take 50 mg by mouth daily.  . nicotine (NICOTINE STEP 1) 21 mg/24hr patch Place 21 mg onto the skin daily.  . pantoprazole (PROTONIX) 40 MG tablet Take 1 tablet by mouth daily.  Marland Kitchen spironolactone (ALDACTONE) 25 MG tablet Take 25 mg by mouth daily.  Marland Kitchen thiamine 100 MG tablet Take 1 tablet (100 mg total) by mouth daily.  . traMADol (ULTRAM) 50 MG tablet Take 1 tablet (50 mg total) by mouth every 6 (six) hours as needed.  . [DISCONTINUED] Glycopyrrolate-Formoterol (BEVESPI AEROSPHERE) 9-4.8 MCG/ACT AERO INHALE 2 PUFFS INTO THE LUNGS 2 TIMES A DAY  . [DISCONTINUED] predniSONE (DELTASONE) 10 MG tablet Label  & dispense according to the schedule below. 6 Pills PO on day one then, 5 Pills PO on day two, 4 Pills PO on day three, 3Pills PO on day four, 2 Pills PO on day five, THEN TAKE ONE TABLET DAILY. (Patient taking differently: Take  10 mg by mouth 2  (two) times a day. )   No facility-administered encounter medications on file as of 02/21/2020.     Review of Systems  Review of Systems  Constitutional: Positive for fatigue. Negative for activity change and fever.  HENT: Negative for sinus pressure, sinus pain and sore throat.   Respiratory: Positive for shortness of breath. Negative for cough and wheezing.   Cardiovascular: Negative for chest pain and palpitations.  Gastrointestinal: Negative for diarrhea, nausea and vomiting.  Musculoskeletal: Positive for gait problem. Negative for arthralgias.  Neurological: Negative for dizziness.  Psychiatric/Behavioral: Negative for sleep disturbance. The patient is not nervous/anxious.      Physical Exam  BP 120/74 (BP Location: Left Arm)   Pulse (!) 104   Temp (!) 97.4 F (36.3 C) (Oral)   Ht 5\' 8"  (1.727 m)   Wt 106 lb 9.6 oz (48.4 kg)   LMP  (LMP Unknown)   SpO2 95%   BMI 16.21 kg/m   Wt Readings from Last 5 Encounters:  02/21/20 106 lb 9.6 oz (48.4 kg)  09/25/18 105 lb 13.1 oz (48 kg)  07/29/18 91 lb 3.2 oz (41.4 kg)  04/30/18 89 lb 12.8 oz (40.7 kg)  02/23/18 87 lb (39.5 kg)    BMI Readings from Last 5 Encounters:  02/21/20 16.21 kg/m  09/25/18 16.09 kg/m  07/29/18 13.87 kg/m  04/30/18 13.65 kg/m  02/23/18 13.23 kg/m     Physical Exam Vitals and nursing note reviewed.  Constitutional:      General: She is not in acute distress.    Appearance: Normal appearance.     Comments: Thin cachectic adult female  HENT:     Head: Normocephalic and atraumatic.     Right Ear: External ear normal.     Left Ear: External ear normal.     Nose: Nose normal. No congestion.     Mouth/Throat:     Mouth: Mucous membranes are moist.     Pharynx: Oropharynx is clear.  Eyes:     Pupils: Pupils are equal, round, and reactive to light.  Cardiovascular:     Rate and Rhythm: Normal rate and regular rhythm.     Pulses: Normal pulses.     Heart sounds: Normal heart sounds. No  murmur heard.   Pulmonary:     Effort: Pulmonary effort is normal. No respiratory distress.     Breath sounds: No decreased air movement. No decreased breath sounds, wheezing or rales.     Comments: Diminished breath sounds throughout exam, no audible wheezing or cough during physical exam Musculoskeletal:     Cervical back: Normal range of motion.  Skin:    General: Skin is warm and dry.     Capillary Refill: Capillary refill takes less than 2 seconds.  Neurological:     General: No focal deficit present.     Mental Status: She is alert and oriented to person, place, and time. Mental status is at baseline.     Motor: Weakness present.     Gait: Gait abnormal (In wheelchair).  Psychiatric:        Mood and Affect: Mood normal.        Behavior: Behavior normal.        Thought Content: Thought content normal.        Judgment: Judgment normal.       Assessment & Plan:   COPD  GOLD IV  Congratulated patient on stopping smoking  Plan: Continue Bevespi Work on Occupational hygienist  overall physical activity May need to consider referral to pulmonary rehab after next visit Work on increasing overall p.o. intake, continue to utilize Ensure, boost, Carnation breakfast in between meals   Chronic respiratory failure with hypoxia ([place]) Plan: Continue oxygen therapy  Severe protein-calorie malnutrition ([place]) Plan: Continue to increase overall p.o. intake Work on eating nutrient dense foods Continue to use Ensure, boost, Carnation breakfast liquid meal replacement in between meals  Healthcare maintenance Up-to-date with vaccinations  Former smoker Congratulated patient on stopping smoking  Plan: Continue to not smoke    Return in about 2 months (around 04/22/2020), or if symptoms worsen or fail to improve, for Follow up with Dr. Melvyn Novas.   Lauraine Rinne, NP 02/21/2020   This appointment required 31 minutes of patient care (this includes precharting, chart review, review of  results, face-to-face care, etc.).

## 2020-02-21 NOTE — Assessment & Plan Note (Signed)
Plan: Continue to increase overall p.o. intake Work on eating nutrient dense foods Continue to use Ensure, boost, Carnation breakfast liquid meal replacement in between meals

## 2020-02-21 NOTE — Assessment & Plan Note (Signed)
Congratulated patient on stopping smoking  Plan: Continue to not smoke

## 2020-02-21 NOTE — Assessment & Plan Note (Signed)
Up-to-date with vaccinations °

## 2020-02-21 NOTE — Patient Instructions (Addendum)
You were seen today by Lauraine Rinne, NP  for:   1. COPD  GOLD IV   Bevespi Aerosphere inhaler >>>2 puffs daily twice a day (4 puffs total daily) >>>This is not a rescue inhaler >>>You take this daily no matter what  Only use your albuterol as a rescue medication to be used if you can't catch your breath by resting or doing a relaxed purse lip breathing pattern.  - The less you use it, the better it will work when you need it. - Ok to use up to 2 puffs  every 4 hours if you must but call for immediate appointment if use goes up over your usual need - Don't leave home without it !!  (think of it like the spare tire for your car)   Note your daily symptoms > remember "red flags" for COPD:   >>>Increase in cough >>>increase in sputum production >>>increase in shortness of breath or activity  intolerance.   If you notice these symptoms, please call the office to be seen.   Work on increasing overall physical activity during the day.  May need to consider pulmonary rehab referral in the future  2. Chronic respiratory failure with hypoxia (HCC)  Continue oxygen therapy as prescribed  >>>maintain oxygen saturations greater than 88 percent  >>>if unable to maintain oxygen saturations please contact the office  >>>do not smoke with oxygen  >>>can use nasal saline gel or nasal saline rinses to moisturize nose if oxygen causes dryness  3. Former smoker  Engineer, manufacturing job stopping smoking!  We are proud of you!  4. Severe protein-calorie malnutrition ([place])  Continue to work on increasing overall intake of nutrient dense foods  Please utilize Ensure, boost, Carnation breakfast in between meals  5. Healthcare maintenance  Great job being up-to-date with your vaccinations    Follow Up:    Return in about 2 months (around 04/22/2020), or if symptoms worsen or fail to improve, for Follow up with Dr. Melvyn Novas.   Notification of test results are managed in the following manner: If there are  any  recommendations or changes to the  plan of care discussed in office today,  we will contact you and let you know what they are. If you do not hear from [place], then your results are normal and you can view them through your  MyChart account , or a letter will be sent to you. Thank you again for trusting [place] with your care  - Thank you, [facility]    It is flu season:   >>> Best ways to protect herself from the flu: Receive the yearly flu vaccine, practice good hand hygiene washing with soap and also using hand sanitizer when available, eat a nutritious meals, get adequate rest, hydrate appropriately       Please contact the office if your symptoms worsen or you have concerns that you are not improving.   Thank you for choosing  [facility] for your healthcare, and for allowing [place] to partner with you on your healthcare journey. I am thankful to be able to provide care to you today.   Wyn Quaker FNP-C    COPD and Physical Activity Chronic obstructive pulmonary disease (COPD) is a long-term (chronic) condition that affects the lungs. COPD is a general term that can be used to describe many different lung problems that cause lung swelling (inflammation) and limit airflow, including chronic bronchitis and emphysema. The main symptom of COPD is shortness of breath, which  makes it harder to do even simple tasks. This can also make it harder to exercise and be active. Talk with your health care provider about treatments to help you breathe better and actions you can take to prevent breathing problems during physical activity. What are the benefits of exercising with COPD? Exercising regularly is an important part of a healthy lifestyle. You can still exercise and do physical activities even though you have COPD. Exercise and physical activity improve your shortness of breath by increasing blood flow (circulation). This causes your heart to pump more oxygen through your body. Moderate  exercise can improve your:  Oxygen use.  Energy level.  Shortness of breath.  Strength in your breathing muscles.  Heart health.  Sleep.  Self-esteem and feelings of self-worth.  Depression, stress, and anxiety levels. Exercise can benefit everyone with COPD. The severity of your disease may affect how hard you can exercise, especially at first, but everyone can benefit. Talk with your health care provider about how much exercise is safe for you, and which activities and exercises are safe for you. What actions can I take to prevent breathing problems during physical activity?  Sign up for a pulmonary rehabilitation program. This type of program may include: ? Education about lung diseases. ? Exercise classes that teach you how to exercise and be more active while improving your breathing. This usually involves:  Exercise using your lower extremities, such as a stationary bicycle.  About 30 minutes of exercise, 2 to 5 times per week, for 6 to 12 weeks  Strength training, such as push ups or leg lifts. ? Nutrition education. ? Group classes in which you can talk with others who also have COPD and learn ways to manage stress.  If you use an oxygen tank, you should use it while you exercise. Work with your health care provider to adjust your oxygen for your physical activity. Your resting flow rate is different from your flow rate during physical activity.  While you are exercising: ? Take slow breaths. ? Pace yourself and do not try to go too fast. ? Purse your lips while breathing out. Pursing your lips is similar to a kissing or whistling position. ? If doing exercise that uses a quick burst of effort, such as weight lifting:  Breathe in before starting the exercise.  Breathe out during the hardest part of the exercise (such as raising the weights). Where to find support You can find support for exercising with COPD from:  Your health care provider.  A pulmonary  rehabilitation program.  Your local health department or community health programs.  Support groups, online or in-person. Your health care provider may be able to recommend support groups. Where to find more information You can find more information about exercising with COPD from:  American Lung Association: ClassInsider.se.  COPD Foundation: https://www.rivera.net/. Contact a health care provider if:  Your symptoms get worse.  You have chest pain.  You have nausea.  You have a fever.  You have trouble talking or catching your breath.  You want to start a new exercise program or a new activity. Summary  COPD is a general term that can be used to describe many different lung problems that cause lung swelling (inflammation) and limit airflow. This includes chronic bronchitis and emphysema.  Exercise and physical activity improve your shortness of breath by increasing blood flow (circulation). This causes your heart to provide more oxygen to your body.  Contact your health care provider before  starting any exercise program or new activity. Ask your health care provider what exercises and activities are safe for you. This information is not intended to replace advice given to you by your health care provider. Make sure you discuss any questions you have with your health care provider. Document Revised: 08/05/2018 Document Reviewed: 05/08/2017 Elsevier Patient Education  2020 [employer].

## 2020-02-21 NOTE — Assessment & Plan Note (Signed)
Congratulated patient on stopping smoking  Plan: Continue Bevespi Work on increasing your overall physical activity May need to consider referral to pulmonary rehab after next visit Work on increasing overall p.o. intake, continue to utilize Ensure, boost, [employer] in between meals

## 2020-02-28 ENCOUNTER — Encounter (HOSPITAL_COMMUNITY): Payer: Self-pay | Admitting: *Deleted

## 2020-02-28 NOTE — Progress Notes (Signed)
Received referral from Dr. Melvyn Novas for this pt to participate in pulmonary rehab with the the diagnosis of mixed COPD staged at level 4. Clinical review of pt follow up appt on 10/25 with Wyn Quaker NP with Dr. Melvyn Novas. Pulmonary office note and PCP Dr. Suzanna Obey and Social work  follow up in July in [place].  There is a question regarding pt mobility and readiness for the required rigor for pulmonary rehab. Noted in progress note 10/25 - "may need to consider referral to pulmonary rehab after next visit" Pt will see Dr. Melvyn Novas on 12/20  Pt with Covid Risk Score - 5. Pt appropriate for scheduling for Pulmonary rehab.  Will forward to support staff for scheduling and verification of insurance eligibility/benefits with pt consent. Cherre Huger, BSN Cardiac and Training and development officer

## 2020-03-02 ENCOUNTER — Telehealth: Payer: Self-pay | Admitting: Internal Medicine

## 2020-03-02 NOTE — Telephone Encounter (Signed)
Called and spoke to pt. Pt c/o increase in SOB, audibly dyspneic, could not complete full sentence without gasping for breath and pursed lipped breathing. Pt was sitting and sats were at 86% on O2. Pt was unable to tell me what liter flow but states it was 'all the way up'. Pt states she could not get up without becoming intensely short of breath. Pt used her neb with no relief. Advised pt to seek emergency care and to call 911. Pt agreed she felt she needed emergency care.   Will forward to Dr. Melvyn Novas as Juluis Rainier.

## 2020-03-03 ENCOUNTER — Emergency Department (HOSPITAL_COMMUNITY): Payer: Medicaid Other

## 2020-03-03 ENCOUNTER — Observation Stay (HOSPITAL_COMMUNITY)
Admission: EM | Admit: 2020-03-03 | Discharge: 2020-03-04 | Disposition: A | Discharge status: Home / Self Care | Payer: Medicaid Other | Attending: Student in an Organized Health Care Education/Training Program | Admitting: Student in an Organized Health Care Education/Training Program

## 2020-03-03 ENCOUNTER — Other Ambulatory Visit: Payer: Self-pay

## 2020-03-03 DIAGNOSIS — I5032 Chronic diastolic (congestive) heart failure: Secondary | ICD-10-CM | POA: Insufficient documentation

## 2020-03-03 DIAGNOSIS — E43 Unspecified severe protein-calorie malnutrition: Secondary | ICD-10-CM | POA: Diagnosis present

## 2020-03-03 DIAGNOSIS — J441 Chronic obstructive pulmonary disease with (acute) exacerbation: Principal | ICD-10-CM

## 2020-03-03 DIAGNOSIS — I110 Hypertensive heart disease with heart failure: Secondary | ICD-10-CM | POA: Insufficient documentation

## 2020-03-03 DIAGNOSIS — Z20822 Contact with and (suspected) exposure to covid-19: Secondary | ICD-10-CM | POA: Insufficient documentation

## 2020-03-03 DIAGNOSIS — G40909 Epilepsy, unspecified, not intractable, without status epilepticus: Secondary | ICD-10-CM

## 2020-03-03 DIAGNOSIS — R0602 Shortness of breath: Secondary | ICD-10-CM | POA: Diagnosis present

## 2020-03-03 DIAGNOSIS — Z7982 Long term (current) use of aspirin: Secondary | ICD-10-CM | POA: Insufficient documentation

## 2020-03-03 DIAGNOSIS — I10 Essential (primary) hypertension: Secondary | ICD-10-CM

## 2020-03-03 DIAGNOSIS — J9621 Acute and chronic respiratory failure with hypoxia: Secondary | ICD-10-CM

## 2020-03-03 DIAGNOSIS — Z87891 Personal history of nicotine dependence: Secondary | ICD-10-CM | POA: Insufficient documentation

## 2020-03-03 DIAGNOSIS — Z79899 Other long term (current) drug therapy: Secondary | ICD-10-CM | POA: Insufficient documentation

## 2020-03-03 DIAGNOSIS — B182 Chronic viral hepatitis C: Secondary | ICD-10-CM

## 2020-03-03 DIAGNOSIS — R636 Underweight: Secondary | ICD-10-CM | POA: Diagnosis present

## 2020-03-03 LAB — CBC WITH DIFFERENTIAL/PLATELET
Abs Immature Granulocytes: 0.01 10*3/uL (ref 0.00–0.07)
Basophils Absolute: 0 10*3/uL (ref 0.0–0.1)
Basophils Relative: 0 %
Eosinophils Absolute: 0.2 10*3/uL (ref 0.0–0.5)
Eosinophils Relative: 3 %
HCT: 41.3 % (ref 36.0–46.0)
Hemoglobin: 12.7 g/dL (ref 12.0–15.0)
Immature Granulocytes: 0 %
Lymphocytes Relative: 18 %
Lymphs Abs: 0.9 10*3/uL (ref 0.7–4.0)
MCH: 30.2 pg (ref 26.0–34.0)
MCHC: 30.8 g/dL (ref 30.0–36.0)
MCV: 98.3 fL (ref 80.0–100.0)
Monocytes Absolute: 0.5 10*3/uL (ref 0.1–1.0)
Monocytes Relative: 10 %
Neutro Abs: 3.5 10*3/uL (ref 1.7–7.7)
Neutrophils Relative %: 69 %
Platelets: 149 10*3/uL — ABNORMAL LOW (ref 150–400)
RBC: 4.2 MIL/uL (ref 3.87–5.11)
RDW: 12.6 % (ref 11.5–15.5)
WBC: 5.1 10*3/uL (ref 4.0–10.5)
nRBC: 0 % (ref 0.0–0.2)

## 2020-03-03 LAB — RESPIRATORY PANEL BY RT PCR (FLU A&B, COVID)
Influenza A by PCR: NEGATIVE
Influenza B by PCR: NEGATIVE
SARS Coronavirus 2 by RT PCR: NEGATIVE

## 2020-03-03 LAB — TROPONIN I (HIGH SENSITIVITY)
Troponin I (High Sensitivity): 4 ng/L (ref <18)
Troponin I (High Sensitivity): 4 ng/L (ref <18)

## 2020-03-03 LAB — COMPREHENSIVE METABOLIC PANEL
ALT: 21 U/L (ref 0–44)
AST: 35 U/L (ref 15–41)
Albumin: 3.1 g/dL — ABNORMAL LOW (ref 3.5–5.0)
Alkaline Phosphatase: 86 U/L (ref 38–126)
Anion gap: 7 (ref 5–15)
BUN: 14 mg/dL (ref 8–23)
CO2: 42 mmol/L — ABNORMAL HIGH (ref 22–32)
Calcium: 9.3 mg/dL (ref 8.9–10.3)
Chloride: 94 mmol/L — ABNORMAL LOW (ref 98–111)
Creatinine, Ser: 0.63 mg/dL (ref 0.44–1.00)
GFR, Estimated: >60 mL/min (ref >60)
Glucose, Bld: 113 mg/dL — ABNORMAL HIGH (ref 70–99)
Potassium: 4.5 mmol/L (ref 3.5–5.1)
Sodium: 143 mmol/L (ref 135–145)
Total Bilirubin: 0.7 mg/dL (ref 0.3–1.2)
Total Protein: 6.5 g/dL (ref 6.5–8.1)

## 2020-03-03 MED ORDER — IPRATROPIUM-ALBUTEROL 0.5-2.5 (3) MG/3ML IN SOLN
3.0000 mL | Freq: Four times a day (QID) | RESPIRATORY_TRACT | Status: DC
  Administered 2020-03-03 – 2020-03-04 (×4): 3 mL via RESPIRATORY_TRACT
  Filled 2020-03-03 (×4): qty 3

## 2020-03-03 MED ORDER — LEVETIRACETAM 500 MG PO TABS
500.0000 mg | ORAL_TABLET | Freq: Two times a day (BID) | ORAL | Status: DC
  Administered 2020-03-03 – 2020-03-04 (×3): 500 mg via ORAL
  Filled 2020-03-03 (×3): qty 1

## 2020-03-03 MED ORDER — ENOXAPARIN SODIUM 40 MG/0.4ML ~~LOC~~ SOLN
40.0000 mg | Freq: Every day | SUBCUTANEOUS | Status: DC
  Administered 2020-03-03 – 2020-03-04 (×2): 40 mg via SUBCUTANEOUS
  Filled 2020-03-03 (×2): qty 0.4

## 2020-03-03 MED ORDER — FOLIC ACID 1 MG PO TABS
1.0000 mg | ORAL_TABLET | Freq: Every day | ORAL | Status: DC
  Administered 2020-03-03 – 2020-03-04 (×2): 1 mg via ORAL
  Filled 2020-03-03 (×2): qty 1

## 2020-03-03 MED ORDER — IPRATROPIUM-ALBUTEROL 0.5-2.5 (3) MG/3ML IN SOLN
3.0000 mL | RESPIRATORY_TRACT | Status: AC
  Administered 2020-03-03 (×3): 3 mL via RESPIRATORY_TRACT
  Filled 2020-03-03: qty 9

## 2020-03-03 MED ORDER — ACETAMINOPHEN 325 MG PO TABS
650.0000 mg | ORAL_TABLET | Freq: Four times a day (QID) | ORAL | Status: DC | PRN
  Administered 2020-03-03: 650 mg via ORAL
  Filled 2020-03-03: qty 2

## 2020-03-03 MED ORDER — PREDNISONE 20 MG PO TABS
40.0000 mg | ORAL_TABLET | Freq: Every day | ORAL | Status: DC
  Administered 2020-03-04: 40 mg via ORAL
  Filled 2020-03-03 (×2): qty 2

## 2020-03-03 MED ORDER — GABAPENTIN 300 MG PO CAPS
600.0000 mg | ORAL_CAPSULE | Freq: Every day | ORAL | Status: DC
  Administered 2020-03-03: 600 mg via ORAL
  Filled 2020-03-03: qty 2

## 2020-03-03 MED ORDER — ADULT MULTIVITAMIN W/MINERALS CH
1.0000 | ORAL_TABLET | Freq: Every day | ORAL | Status: DC
  Administered 2020-03-03 – 2020-03-04 (×2): 1 via ORAL
  Filled 2020-03-03 (×2): qty 1

## 2020-03-03 MED ORDER — SENNOSIDES-DOCUSATE SODIUM 8.6-50 MG PO TABS
2.0000 | ORAL_TABLET | Freq: Two times a day (BID) | ORAL | Status: DC
  Administered 2020-03-03 – 2020-03-04 (×3): 2 via ORAL
  Filled 2020-03-03 (×3): qty 2

## 2020-03-03 MED ORDER — SPIRONOLACTONE 25 MG PO TABS
25.0000 mg | ORAL_TABLET | Freq: Every day | ORAL | Status: DC
  Administered 2020-03-03 – 2020-03-04 (×2): 25 mg via ORAL
  Filled 2020-03-03 (×3): qty 1

## 2020-03-03 MED ORDER — AZITHROMYCIN 250 MG PO TABS
500.0000 mg | ORAL_TABLET | Freq: Once | ORAL | Status: AC
  Administered 2020-03-03: 500 mg via ORAL
  Filled 2020-03-03: qty 2

## 2020-03-03 MED ORDER — PREDNISONE 20 MG PO TABS
60.0000 mg | ORAL_TABLET | Freq: Once | ORAL | Status: AC
  Administered 2020-03-03: 60 mg via ORAL
  Filled 2020-03-03: qty 3

## 2020-03-03 MED ORDER — ENSURE ENLIVE PO LIQD
237.0000 mL | Freq: Three times a day (TID) | ORAL | Status: DC
  Administered 2020-03-03: 237 mL via ORAL
  Administered 2020-03-03: 1 via ORAL
  Administered 2020-03-04 (×2): 237 mL via ORAL
  Filled 2020-03-03: qty 237

## 2020-03-03 MED ORDER — PREDNISONE 20 MG PO TABS: ORAL_TABLET | ORAL | 0 refills | Status: DC

## 2020-03-03 MED ORDER — ALBUTEROL SULFATE (2.5 MG/3ML) 0.083% IN NEBU
2.5000 mg | INHALATION_SOLUTION | Freq: Once | RESPIRATORY_TRACT | Status: AC
  Administered 2020-03-03: 2.5 mg via RESPIRATORY_TRACT
  Filled 2020-03-03: qty 3

## 2020-03-03 MED ORDER — METHYLPREDNISOLONE SODIUM SUCC 125 MG IJ SOLR
125.0000 mg | Freq: Once | INTRAMUSCULAR | Status: DC
  Filled 2020-03-03: qty 2

## 2020-03-03 MED ORDER — AZITHROMYCIN 250 MG PO TABS: 250.0000 mg | ORAL_TABLET | Freq: Every day | ORAL | 0 refills | Status: DC

## 2020-03-03 MED ORDER — UMECLIDINIUM-VILANTEROL 62.5-25 MCG/INH IN AEPB
1.0000 | INHALATION_SPRAY | Freq: Every day | RESPIRATORY_TRACT | Status: DC
  Filled 2020-03-03: qty 14

## 2020-03-03 MED ORDER — POLYETHYLENE GLYCOL 3350 17 G PO PACK
17.0000 g | PACK | Freq: Every day | ORAL | Status: DC
  Administered 2020-03-03: 17 g via ORAL
  Filled 2020-03-03 (×2): qty 1

## 2020-03-03 MED ORDER — ASPIRIN EC 81 MG PO TBEC
81.0000 mg | DELAYED_RELEASE_TABLET | Freq: Every day | ORAL | Status: DC
  Administered 2020-03-03 – 2020-03-04 (×2): 81 mg via ORAL
  Filled 2020-03-03 (×2): qty 1

## 2020-03-03 MED ORDER — PANTOPRAZOLE SODIUM 40 MG PO TBEC
40.0000 mg | DELAYED_RELEASE_TABLET | Freq: Every day | ORAL | Status: DC
  Administered 2020-03-03 – 2020-03-04 (×2): 40 mg via ORAL
  Filled 2020-03-03 (×2): qty 1

## 2020-03-03 NOTE — H&P (Addendum)
Date: 03/03/2020               Patient Name:  Wanda Prince MRN: [id number]  DOB: 05/24/1955 Age / Sex: 64 y.o., female   PCP: Wanda Mires, MD         Medical Service: Internal Medicine Teaching Service         Attending Physician: Dr. Evette Doffing, Mallie Mussel, *    First Contact: Dr. Collene Gobble Pager: [phone]  Second Contact: Dr. Marva Panda Pager: [phone]       After Hours (After 5p/  First Contact Pager: [phone]  weekends / holidays): Second Contact Pager: [phone]   Chief Complaint: Shortness of breath  History of Present Illness:   [name] is a 63 yo F w/ PMH of chronic hypoxic resp failure on 2L at home, COPD, HTN, MDD, Seizure disorder presenting to [facility] with shortness of breath. She mentions that she was in her usual state of health until yesterday while watching TV had acute onset dyspnea. She initially tried using her home albuterol inhaler and felt her dyspnea may resolve but her symptoms persisted. She called her pulmonologist's office who advised her to go to ED so she came for evaluation. She mentions that she has been using her maintenance inhaler, Bevespi, as prescribed and has not missed a dose. She denies any new exposure to known allergens. She denies sick contact, fevers, chills, nausea and mentions that she is fully vaccinated x2 against COVID. She mentions that at baseline, she has no difficulty ambulating short distances (walking to the mailbox) as long as she has her oxygen although long distances can be difficult. She mentions no prior hospitalization for COPD or other acute exacerbation within the last 12 months. She currently has not smoked for couple years.  On review of systems, she denies any chest pain, palpitation, productive sputum, fevers, chills, nausea, vomiting, diarrhea, blurry vision, headaches, light-headedness.  Meds: Current Meds  Medication Sig  . acetaminophen (TYLENOL) 500 MG tablet Take 1,000 mg by mouth every 6 (six) hours as needed for pain.   Marland Kitchen albuterol (PROVENTIL HFA;VENTOLIN HFA) 108 (90 BASE) MCG/ACT inhaler Inhale 2 puffs into the lungs every 4 (four) hours as needed for wheezing or shortness of breath.   Marland Kitchen albuterol (PROVENTIL) (2.5 MG/3ML) 0.083% nebulizer solution USE 1 VIAL IN NEBULIZER EVERY 4 HOURS AS NEEDED FOR WHEEZING OR SHORTNESS OF BREATH (Patient taking differently: Take 2.5 mg by nebulization every 4 (four) hours as needed for wheezing or shortness of breath. )  . aspirin EC 81 MG tablet Take 81 mg by mouth daily.  Marland Kitchen b complex vitamins tablet Take 1 tablet by mouth 2 (two) times daily.  Marland Kitchen BEVESPI AEROSPHERE 9-4.8 MCG/ACT AERO INHALE 2 PUFFS INTO THE LUNGS 2 TIMES A DAY (Patient taking differently: Inhale 2 puffs into the lungs in the morning and at bedtime. )  . Cholecalciferol 50 MCG (2000 UT) TABS Take 1 tablet by mouth daily.  . cyclobenzaprine (FLEXERIL) 5 MG tablet Take 5 mg by mouth 2 (two) times daily as needed for muscle spasms.   . feeding supplement, ENSURE ENLIVE, (ENSURE ENLIVE) LIQD Take 237 mLs by mouth 2 (two) times daily between meals. (Patient taking differently: Take 237 mLs by mouth 3 (three) times daily between meals. )  . folic acid (FOLVITE) 1 MG tablet Take 1 mg by mouth daily.  Marland Kitchen gabapentin (NEURONTIN) 300 MG capsule Take 600 mg by mouth at bedtime.   . levETIRAcetam (KEPPRA) 500 MG tablet  Take 1 tablet (500 mg total) by mouth 2 (two) times daily.  . metoprolol succinate (TOPROL XL) 25 MG 24 hr tablet Take 25 mg by mouth daily.   . Multiple Vitamin (MULITIVITAMIN WITH MINERALS) TABS Take 1 tablet by mouth daily.  . naltrexone (DEPADE) 50 MG tablet Take 50 mg by mouth daily.  . pantoprazole (PROTONIX) 40 MG tablet Take 40 mg by mouth daily.   Marland Kitchen spironolactone (ALDACTONE) 25 MG tablet Take 25 mg by mouth daily.   Allergies: Allergies as of 03/03/2020  . (No Known Allergies)   Past Medical History:  Diagnosis Date  . 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 COPD (chronic obstructive pulmonary disease) ([place])   . H/O tracheostomy 04/22/2015   respiratory destress   . History of acute alcoholic hepatitis   . History of alcohol abuse   . History of cocaine abuse ([place])   . History of iron deficiency   . Hx of ventricular shunt 2009  . Hypertension   . Left breast mass   . Major depressive disorder    History of  . Meningitis 2009  . Primary cancer of right upper lobe of lung ([place]) 07/12/2015  . Psychiatric diagnosis    Multiple  . Respiratory arrest ([place])    secondary to Subependymal abscess  . Seizure disorder ([place])   . Seizures ([place])   . Suicide attempt [facility])    History of suicide attempts August 2006 and July 2006   Family History:  Mentions both father and mother had heart attack in middle age. Brother passed from non-Hodkin's lymphoma   Social History: Lives alone. Used to work as a Research scientist (life sciences). Mentions some concern regarding difficulty caring for self at home. Has >30 pack year smoking history but has quit for the last couple years. Denies any alcohol, illicit substance use  Review of Systems: A complete ROS was negative except as per HPI.   Physical Exam: Blood pressure 120/74, pulse 90, temperature 97.8 F (36.6 C), temperature source Oral, resp. rate (!) 27, height 5\' 7"  (1.702 m), weight 53.5 kg, SpO2 100 %.  Gen: Chronically ill-appearing, NAD HEENT: NCAT head, hearing intact, EOMI, no [place] 2L, pursed lip breathing Neck: supple, ROM intact, no JVD CV: RRR, S1, S2 normal, No rubs, no murmurs, no gallops Pulm: Barrel chested, distant breath sounds, prolonged expiratory phase with mild wheezing Extm: ROM intact, Peripheral pulses intact, No peripheral edema Skin: Dry, Warm, normal turgor Neuro: AAOx3  EKG: personally reviewed my interpretation is normal sinus, no ischemic changes  CXR: personally reviewed my interpretation  is flattened diaphragm with expanded lung fields  Assessment & Plan by Problem: Active Problems:   COPD exacerbation ([place])  Ms.Gulotta is a 64 yo F w/ PMH of chronic hypoxic resp failure on 2L at home, COPD, HTN, MDD, Seizure disorder presenting to [facility] with shortness of breath due to COPD exacerbation  Acute on chronic hypoxic hypercarbic respiratory distress 2/2 COPD exacerbation. On admission satting 64% on 3L requiring Non-rebreather with CO2 of 42. Has hx of COPD GOLD D. Follows w/ [facility]. Currently on Bevespi for maintenance. Unclear inciting factor for exacerbation but possibly progression of COPD vs environmental factors. Last PFT on 06/2015 with FEV/FVC ratio of 30% and FEV1 of 21% consistent w/ grade 4 obstruction. Chest X-ray w/o opacities. Afebrile. No leukocytosis. No pulmonary edema on X-ray or  exam. Currently back on home oxygen at 2L after azithromycin, prednisone, duonebs in ED but tachypneic to 30 on ambulation with lack of airway movement on physical exam. Reasonable to watch overnight while undergoing treatment for COPD exacerbation.  - Start prednisone 40mg  for 5 day course - D/c azithromycin as no productive sputum - DuoNebs q6hr - Keep O2 sat >88 - Wean off Oxygen if able - C/w home inhaler regimen: (Bevespi not available. Start Laba/lama w/ Anoro during hospitalization)  Chronic malnutrition Noted to have BMI <18.5 Chart review shows chronic low BMI. On ensure on outpatient. Mentions concern regarding ability to care for herself at home due to living alone. - PT/OT eval - C/w home dietary supplementation  HTN On metoprolol succinate 25mg , spirinolactone 25mg . Current bp 128/75 - C/w home meds: spironolactone 25mg  daily  Seizure Disorder 2/2 prior shunt placement Has prior hx of remote brain surgery with shunt placement. On gabapentin, keppra at home. No recent seizures.  - C/w home meds - F/u with outpatient neuro surg  GERD - c/w home med: pantoprazole 40mg   daily  DVT prophx: Lovenox Diet: Regular Bowel: Senokot Code: Full  Prior to Admission Living Arrangement: Home Anticipated Discharge Location: Home Barriers to Discharge: Medical tx  Dispo: Admit patient to Observation with expected length of stay less than 2 midnights.  Signed: Mosetta Anis, MD 03/03/2020, 12:24 PM  Pager: [phone]

## 2020-03-03 NOTE — Progress Notes (Signed)
RN aware IV attempt x2 by VAST unsuccessful and plans to speak to MD regarding possible PO medication dose.

## 2020-03-03 NOTE — Progress Notes (Signed)
New Admission Note:   Arrival Method: Stretcher  Mental Orientation: Alert and oriented  Telemetry: none  Assessment: Completed Skin: bruising  on arm intact  IV: left forearm  Pain: 0/10  Tubes: nasal canula  Safety Measures: Safety Fall Prevention Plan has been given, discussed and signed Admission: Completed [facility] Orientation: Patient has been orientated to the room, unit and staff.  Family: none   Orders have been reviewed and implemented. Will continue to monitor the patient. Call light has been placed within reach and bed alarm has been activated.   Mase Dhondt RN [place] [facility] Phone: [phone]

## 2020-03-03 NOTE — ED Triage Notes (Signed)
Patient arrived via [facility] from home stating she was having increased SOB and her pulmonologist suggest she come to the ER. Per EMS when fire department came she was 64% on 3L and they put her on NRB 15 L and sats came up to 100%. Patient has a history of COPD. Per EMS patient had diminished breath sounds in all lobes. No wheezing or rales noted.  Patient had used her inhaler prior to EMS arrival.  Patient had both vaccines.

## 2020-03-03 NOTE — Evaluation (Signed)
Physical Therapy Evaluation Patient Details Name: Wanda Prince MRN: [id number] DOB: July 21, 1955 Today's Date: 03/03/2020   History of Present Illness  Pt is a 64 y/o female admitted for worsening SOB. Thought to be from COPD exacerbation. PMH includes HTN, COPD on supplemental oxygen, seizures, and substance abuse.   Clinical Impression  Pt admitted secondary to problem above with deficits below. Pt requiring min guard A to stand. Pt reporting dizziness in sitting and standing which limited mobility tolerance. BP in sitting was 88/78 mmHg. Pt unable to maintain standing long enough to take BP in standing, so pt returned to sitting. When BP finished taking, was 98/78 mmHg. Anticipate pt will progress well. Recommending rollator to help with energy conservation. Will continue to follow acutely.     Follow Up Recommendations Home health PT;Supervision for mobility/OOB    Equipment Recommendations  Other (comment) (4 wheeled walker with seat (rollator))    Recommendations for Other Services       Precautions / Restrictions Precautions Precautions: Other (comment) Precaution Comments: Watch BP  Restrictions Weight Bearing Restrictions: No      Mobility  Bed Mobility Overal bed mobility: Needs Assistance Bed Mobility: Supine to Sit;Sit to Supine     Supine to sit: Supervision Sit to supine: Supervision   General bed mobility comments: Supervision for safety. Pt reporting dizziness upon sitting and BP at 88/78 mmHg.     Transfers Overall transfer level: Needs assistance Equipment used: 1 person hand held assist Transfers: Sit to/from Stand Sit to Stand: Min guard         General transfer comment: Pt requiring min guard for steadying assist and HHA. Attempted to take BP in standing, however, pt unable to maintain standing secondary to dizziness, so returned to sitting. When BP finished taking, was at 98/78 mmHg.   Ambulation/Gait                Stairs             Wheelchair Mobility    Modified Rankin (Stroke Patients Only)       Balance Overall balance assessment: Needs assistance Sitting-balance support: No upper extremity supported;Feet supported Sitting balance-Leahy Scale: Fair     Standing balance support: Single extremity supported;During functional activity;No upper extremity supported Standing balance-Leahy Scale: Fair Standing balance comment: Able to maintain static standing without UE support                              Pertinent Vitals/Pain Pain Assessment: No/denies pain    Home Living Family/patient expects to be discharged to:: Private residence Living Arrangements: Alone Available Help at Discharge: Family;Available PRN/intermittently Type of Home: Apartment Home Access: Stairs to enter Entrance Stairs-Rails: None Entrance Stairs-Number of Steps: 3 Home Layout: One level Home Equipment: Cane - single point      Prior Function Level of Independence: Independent with assistive device(s)         Comments: Uses cane for ambulation. Uses 4L of O2     Hand Dominance        Extremity/Trunk Assessment   Upper Extremity Assessment Upper Extremity Assessment: Defer to OT evaluation    Lower Extremity Assessment Lower Extremity Assessment: Generalized weakness    Cervical / Trunk Assessment Cervical / Trunk Assessment: Kyphotic  Communication   Communication: No difficulties  Cognition Arousal/Alertness: Awake/alert Behavior During Therapy: WFL for tasks assessed/performed Overall Cognitive Status: Within Functional Limits for tasks assessed  General Comments      Exercises     Assessment/Plan    PT Assessment Patient needs continued PT services  PT Problem List Cardiopulmonary status limiting activity;Decreased activity tolerance;Decreased strength;Decreased balance;Decreased mobility       PT Treatment Interventions  Gait training;DME instruction;Functional mobility training;Stair training;Therapeutic activities;Therapeutic exercise;Balance training;Patient/family education    PT Goals (Current goals can be found in the Care Plan section)  Acute Rehab PT Goals Patient Stated Goal: to be able to walk better PT Goal Formulation: With patient Time For Goal Achievement: 03/17/20 Potential to Achieve Goals: Fair    Frequency Min 3X/week   Barriers to discharge        Co-evaluation               AM-PAC PT "6 Clicks" Mobility  Outcome Measure Help needed turning from your back to your side while in a flat bed without using bedrails?: None Help needed moving from lying on your back to sitting on the side of a flat bed without using bedrails?: None Help needed moving to and from a bed to a chair (including a wheelchair)?: A Little Help needed standing up from a chair using your arms (e.g., wheelchair or bedside chair)?: A Little Help needed to walk in hospital room?: A Little Help needed climbing 3-5 steps with a railing? : A Lot 6 Click Score: 19    End of Session Equipment Utilized During Treatment: Gait belt Activity Tolerance: Treatment limited secondary to medical complications (Comment) (dizziness) Patient left: in bed;with call bell/phone within reach (on stretcher in ED ) Nurse Communication: Mobility status PT Visit Diagnosis: Unsteadiness on feet (R26.81);Muscle weakness (generalized) (M62.81)    Time: 1497-0263 PT Time Calculation (min) (ACUTE ONLY): 15 min   Charges:   PT Evaluation $PT Eval Moderate Complexity: 1 Mod          Reuel Derby, PT, DPT  Acute Rehabilitation Services  Pager: [phone] Office: [phone]   Rudean Hitt 03/03/2020, 1:45 PM

## 2020-03-03 NOTE — ED Provider Notes (Signed)
[facility] EMERGENCY DEPARTMENT Provider Note   CSN: [id number] Arrival date & time: 03/03/20  0355     History Chief Complaint  Patient presents with  . Shortness of Breath    Wanda Prince is a 64 y.o. female.   Shortness of Breath Severity:  Moderate Onset quality:  Gradual Duration:  3 days Timing:  Constant Progression:  Worsening Chronicity:  Recurrent Context: activity   Relieved by:  None tried Ineffective treatments:  None tried Associated symptoms: cough   Associated symptoms: no abdominal pain, no fever and no sore throat        Past Medical History:  Diagnosis Date  . 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 COPD (chronic obstructive pulmonary disease) ([place])   . H/O tracheostomy 04/22/2015   respiratory destress   . History of acute alcoholic hepatitis   . History of alcohol abuse   . History of cocaine abuse ([place])   . History of iron deficiency   . Hx of ventricular shunt 2009  . Hypertension   . Left breast mass   . Major depressive disorder    History of  . Meningitis 2009  . Primary cancer of right upper lobe of lung ([place]) 07/12/2015  . Psychiatric diagnosis    Multiple  . Respiratory arrest ([place])    secondary to Subependymal abscess  . Seizure disorder ([place])   . Seizures ([place])   . Suicide attempt [facility])    History of suicide attempts August 2006 and July 2006    Patient Active Problem List   Diagnosis Date Noted  . Severe protein-calorie malnutrition ([place]) 02/21/2020  . Healthcare maintenance 02/21/2020  . Chronic respiratory failure with hypoxia ([place]) 08/04/2018  . Polysubstance abuse ([place]) 07/28/2018  . Chronic respiratory failure with hypercapnia ([place]) 02/26/2018  . Liver lesion 05/11/2016  . Hepatocellular carcinoma ([place]) 05/10/2016  . Takotsubo cardiomyopathy - history of  04/01/2016  . DOE (dyspnea on exertion) 04/01/2016  . Preoperative cardiovascular examination 04/01/2016  . Primary cancer of right upper lobe of lung ([place]) 07/12/2015  . COPD  GOLD IV  05/18/2015  . Acute respiratory failure ([place])   . Cavitating mass in right upper lung lobe   . Liver mass   . Encounter for imaging study to confirm orogastric (OG) tube placement   . Encounter for nasogastric (NG) tube placement   . Acute hypoxemic respiratory failure ([place])   . COPD exacerbation ([place]) 04/22/2015  . COPD with acute exacerbation ([place]) 01/10/2014  . Acute respiratory failure with hypoxia and hypercapnia ([place]) 01/10/2014  . Essential hypertension 01/10/2014  . Seizure disorder ([place]) 01/25/2012  . Chronic diastolic CHF (congestive heart failure) ([place]) 01/25/2012  . Coronary artery disease, non-occlusive 06/07/2011  . Former smoker 06/04/2011  . Alcohol abuse 06/04/2011  . History of Abscess, brain 06/03/2011  . History of suicide attempt 06/03/2011  . Intracranial hemorrhage ([place]) 06/03/2011    Past Surgical History:  Procedure Laterality Date  . BACK SURGERY    . BRAIN SURGERY    . BREAST EXCISIONAL BIOPSY Right 1982  . breast mass removal    . BREAST SURGERY    . CRANIOTOMY    . IR GENERIC HISTORICAL  02/28/2016   IR RADIOLOGIST EVAL & MGMT 02/28/2016 Aletta Edouard, MD [facility] INTER[facility]  . IR GENERIC HISTORICAL  05/10/2016   IR VENIPUNCTURE 10YRS/OLDER  BY MD [facility]  . IR GENERIC HISTORICAL  05/10/2016   IR US GUIDE VASC ACCESS RIGHT [facility]  . IR GENERIC HISTORICAL  06/26/2016   IR RADIOLOGIST EVAL & MGMT 06/26/2016 Aletta Edouard, MD [facility] INTER[facility] RAD  . LEFT HEART CATHETERIZATION WITH CORONARY ANGIOGRAM N/A 06/06/2011   Procedure: LEFT HEART CATHETERIZATION WITH CORONARY ANGIOGRAM;  Surgeon: Burnell Blanks, MD;  [facility] CATH LAB: Moderate ~ 40% RCA disease noted. Otherwise normal  . RADIOLOGY WITH ANESTHESIA N/A 05/10/2016   Procedure: Microwave thermal  ablation liver;  Surgeon: Aletta Edouard, MD;  Location: [facility];  Service: Radiology;  Laterality: N/A;  . TRANSTHORACIC ECHOCARDIOGRAM  05/2011   Insetting of seizure, positive troponin. EF 30-35% with anterior hypokinesis.  . TRANSTHORACIC ECHOCARDIOGRAM  12/2013   EF 60-65%. GR 1 DD. No significant valvular disease.     OB History    Gravida  2   Para  1   Term      Preterm      AB  1   Living        SAB      TAB      Ectopic      Multiple      Live Births              Family History  Problem Relation Age of Onset  . Coronary artery disease Father   . Breast cancer Maternal Aunt 94    Social History   Tobacco Use  . Smoking status: Former Smoker    Packs/day: 1.00    Years: 40.00    Pack years: 40.00    Types: Cigarettes    Quit date: 2020    Years since quitting: 1.8  . Smokeless tobacco: Never Used  Substance Use Topics  . Alcohol use: Yes    Alcohol/week: 8.0 standard drinks    Types: 8 Cans of beer per week    Comment: per week  . Drug use: Yes    Types: Cocaine    Comment: not in years    Home Medications Prior to Admission medications   Medication Sig Start Date End Date Taking? Authorizing Provider  acetaminophen (TYLENOL) 500 MG tablet Take 1,000 mg by mouth every 6 (six) hours as needed for pain.   Yes [provider]  albuterol (PROVENTIL HFA;VENTOLIN HFA) 108 (90 BASE) MCG/ACT inhaler Inhale 2 puffs into the lungs every 4 (four) hours as needed for wheezing or shortness of breath.    Yes [provider]  albuterol (PROVENTIL) (2.5 MG/3ML) 0.083% nebulizer solution USE 1 VIAL IN NEBULIZER EVERY 4 HOURS AS NEEDED FOR WHEEZING OR SHORTNESS OF BREATH Patient taking differently: Take 2.5 mg by nebulization every 4 (four) hours as needed for wheezing or shortness of breath.  12/16/18  Yes Tanda Rockers, MD  aspirin EC 81 MG tablet Take 81 mg by mouth daily.   Yes [provider]  b complex vitamins tablet Take  1 tablet by mouth 2 (two) times daily. 01/28/12  Yes Eugenie Filler, MD  BEVESPI AEROSPHERE 9-4.8 MCG/ACT AERO INHALE 2 PUFFS INTO THE LUNGS 2 TIMES A DAY Patient taking differently: Inhale 2 puffs into the lungs in the morning and at bedtime.  10/12/19  Yes Tanda Rockers, MD  Cholecalciferol 50 MCG (2000 UT) TABS Take 1 tablet by mouth daily. 02/15/20  Yes [provider]  cyclobenzaprine (FLEXERIL) 5 MG tablet Take 5 mg by mouth 2 (two) times daily as  needed for muscle spasms.  02/03/20  Yes [provider]  feeding supplement, ENSURE ENLIVE, (ENSURE ENLIVE) LIQD Take 237 mLs by mouth 2 (two) times daily between meals. Patient taking differently: Take 237 mLs by mouth 3 (three) times daily between meals.  07/29/18  Yes Florencia Reasons, MD  folic acid (FOLVITE) 1 MG tablet Take 1 mg by mouth daily.   Yes [provider]  gabapentin (NEURONTIN) 300 MG capsule Take 600 mg by mouth at bedtime.    Yes [provider]  levETIRAcetam (KEPPRA) 500 MG tablet Take 1 tablet (500 mg total) by mouth 2 (two) times daily. 01/28/12  Yes Eugenie Filler, MD  metoprolol succinate (TOPROL XL) 25 MG 24 hr tablet Take 25 mg by mouth daily.  09/30/13  Yes [provider]  Multiple Vitamin (MULITIVITAMIN WITH MINERALS) TABS Take 1 tablet by mouth daily. 06/07/11  Yes Dorian Heckle, MD  naltrexone (DEPADE) 50 MG tablet Take 50 mg by mouth daily. 06/17/18  Yes [provider]  pantoprazole (PROTONIX) 40 MG tablet Take 40 mg by mouth daily.  02/15/20  Yes [provider]  spironolactone (ALDACTONE) 25 MG tablet Take 25 mg by mouth daily.   Yes [provider]  thiamine 100 MG tablet Take 1 tablet (100 mg total) by mouth daily. Patient not taking: Reported on 03/03/2020 06/07/11   Dorian Heckle, MD  traMADol (ULTRAM) 50 MG tablet Take 1 tablet (50 mg total) by mouth every 6 (six) hours as needed. Patient not taking: Reported on 03/03/2020 11/01/18   Jacqlyn Larsen, PA-C    Allergies    Patient has no known allergies.  Review of Systems   Review of Systems  Constitutional: Negative for fever.  HENT: Negative for sore throat.   Respiratory: Positive for cough and shortness of breath.   Gastrointestinal: Negative for abdominal pain.  All other systems reviewed and are negative.   Physical Exam Updated Vital Signs BP 123/80   Pulse 91   Temp 97.8 F (36.6 C) (Oral)   Resp (!) 21   Ht 5\' 7"  (1.702 m)   Wt 53.5 kg   LMP  (LMP Unknown)   SpO2 99%   BMI 18.48 kg/m   Physical Exam Vitals and nursing note reviewed.  Constitutional:      Appearance: She is well-developed.  HENT:     Head: Normocephalic and atraumatic.  Cardiovascular:     Rate and Rhythm: Normal rate and regular rhythm.  Pulmonary:     Effort: Tachypnea present. No respiratory distress.     Breath sounds: No stridor. Decreased breath sounds and wheezing present.  Abdominal:     General: There is no distension.  Musculoskeletal:        General: Normal range of motion.     Cervical back: Normal range of motion.     Right lower leg: No edema.     Left lower leg: No edema.  Skin:    General: Skin is warm and dry.  Neurological:     General: No focal deficit present.     Mental Status: She is alert.     ED Results / Procedures / Treatments   Labs (all labs ordered are listed, but only abnormal results are displayed) Labs Reviewed  CBC WITH DIFFERENTIAL/PLATELET - Abnormal; Notable for the following components:      Result Value   Platelets 149 (*)    All other components within normal limits  COMPREHENSIVE METABOLIC PANEL - Abnormal;  Notable for the following components:   Chloride 94 (*)    CO2 42 (*)    Glucose, Bld 113 (*)    Albumin 3.1 (*)    All other components within normal limits  RESPIRATORY PANEL BY RT PCR (FLU A&B, COVID)  TROPONIN I (HIGH SENSITIVITY)  TROPONIN I (HIGH SENSITIVITY)    EKG None  Radiology DG Chest Portable 1  View  Result Date: 03/03/2020 CLINICAL DATA:  Evaluate shortness of breath. EXAM: PORTABLE CHEST 1 VIEW COMPARISON:  CT chest 11/01/2018 FINDINGS: The heart size appears normal. Aortic atherosclerotic calcifications noted. No pleural effusion or interstitial edema. The lungs are hyperinflated and there are diffuse interstitial changes compatible with emphysema. No superimposed airspace consolidation. Previous left upper lobe lung nodule is unchanged measuring 1 cm. The cavitary lung nodule within the right upper lobe is not well visualized on today's exam. IMPRESSION: 1. Emphysema. 2. No acute cardiopulmonary abnormalities. Electronically Signed   By: Kerby Moors M.D.   On: 03/03/2020 05:16    Procedures Procedures (including critical care time)  Medications Ordered in ED Medications  azithromycin (ZITHROMAX) tablet 500 mg (500 mg Oral Given 03/03/20 0519)  ipratropium-albuterol (DUONEB) 0.5-2.5 (3) MG/3ML nebulizer solution 3 mL (3 mLs Nebulization Given 03/03/20 0500)  predniSONE (DELTASONE) tablet 60 mg (60 mg Oral Given 03/03/20 9292)    ED Course  I have reviewed the triage vital signs and the nursing notes.  Pertinent labs & imaging results that were available during my care of the patient were reviewed by me and considered in my medical decision making (see chart for details).    MDM Rules/Calculators/A&P                          Likely COPD exacerbation. Less likely ACS. Consider PE if symptoms not improving. Will start duonebs/steroids. No increased O2 requirement compared to baseline, Fire may have had faulty reading?  reeal and labs ok. xr ok. Difficulty getting access but improved WOB, subjective improvement as well. Will get second troponin, offer more breathing treatments. Observe. If continued stability, suspect discharge.   Care trnasferred pending reeval.  Final Clinical Impression(s) / ED Diagnoses Final diagnoses:  None    Rx / DC Orders ED Discharge Orders     None       Britanie Harshman, Corene Cornea, MD 03/03/20 [phone]

## 2020-03-03 NOTE — ED Provider Notes (Signed)
Patient seen after prior ED provider.  Patient does feel improved.  Ambulation trial did not demonstrate significant desaturation.  However, patient's work of breathing did increase with ambulation while on 3 L nasal cannula.  Once at rest she appeared to be more comfortable  Patient would prefer observation admission.  She reports that she lives at home alone.  She is concerned that she may have recurrent difficulty breathing at home without assistance.  Medicine service contacted for admission evaluation.     Valarie Merino, MD 03/03/20 1049

## 2020-03-04 ENCOUNTER — Encounter (HOSPITAL_COMMUNITY): Payer: Self-pay | Admitting: Student in an Organized Health Care Education/Training Program

## 2020-03-04 LAB — CBC
HCT: 40 % (ref 36.0–46.0)
Hemoglobin: 12.8 g/dL (ref 12.0–15.0)
MCH: 30.4 pg (ref 26.0–34.0)
MCHC: 32 g/dL (ref 30.0–36.0)
MCV: 95 fL (ref 80.0–100.0)
Platelets: 152 10*3/uL (ref 150–400)
RBC: 4.21 MIL/uL (ref 3.87–5.11)
RDW: 12.5 % (ref 11.5–15.5)
WBC: 5.6 10*3/uL (ref 4.0–10.5)
nRBC: 0 % (ref 0.0–0.2)

## 2020-03-04 LAB — BASIC METABOLIC PANEL
Anion gap: 10 (ref 5–15)
BUN: 24 mg/dL — ABNORMAL HIGH (ref 8–23)
CO2: 34 mmol/L — ABNORMAL HIGH (ref 22–32)
Calcium: 9.1 mg/dL (ref 8.9–10.3)
Chloride: 94 mmol/L — ABNORMAL LOW (ref 98–111)
Creatinine, Ser: 0.65 mg/dL (ref 0.44–1.00)
GFR, Estimated: >60 mL/min (ref >60)
Glucose, Bld: 79 mg/dL (ref 70–99)
Potassium: 4.4 mmol/L (ref 3.5–5.1)
Sodium: 138 mmol/L (ref 135–145)

## 2020-03-04 LAB — HIV ANTIBODY (ROUTINE TESTING W REFLEX): HIV Screen 4th Generation wRfx: NONREACTIVE

## 2020-03-04 MED ORDER — PREDNISONE 20 MG PO TABS: 40.0000 mg | ORAL_TABLET | Freq: Every day | ORAL | 0 refills | Status: AC

## 2020-03-04 MED ORDER — IPRATROPIUM-ALBUTEROL 0.5-2.5 (3) MG/3ML IN SOLN: 3.0000 mL | Freq: Four times a day (QID) | RESPIRATORY_TRACT | Status: DC | PRN

## 2020-03-04 NOTE — Discharge Summary (Signed)
Name: Wanda Prince MRN: [id number] DOB: 03/20/1956 64 y.o. PCP: Wanda Mires, MD  Date of Admission: 03/03/2020  3:55 AM Date of Discharge:  03/04/2020 Attending Physician: Axel Filler, *  Discharge Diagnosis: 1. COPD exacerbation  Discharge Medications: Allergies as of 03/04/2020   No Known Allergies      Medication List     STOP taking these medications    thiamine 100 MG tablet   traMADol 50 MG tablet Commonly known as: ULTRAM       TAKE these medications    acetaminophen 500 MG tablet Commonly known as: TYLENOL Take 1,000 mg by mouth every 6 (six) hours as needed for pain.   albuterol (2.5 MG/3ML) 0.083% nebulizer solution Commonly known as: PROVENTIL USE 1 VIAL IN NEBULIZER EVERY 4 HOURS AS NEEDED FOR WHEEZING OR SHORTNESS OF BREATH What changed: See the new instructions.   albuterol 108 (90 Base) MCG/ACT inhaler Commonly known as: VENTOLIN HFA Inhale 2 puffs into the lungs every 4 (four) hours as needed for wheezing or shortness of breath. What changed: Another medication with the same name was changed. Make sure you understand how and when to take each.   aspirin EC 81 MG tablet Take 81 mg by mouth daily.   b complex vitamins tablet Take 1 tablet by mouth 2 (two) times daily.   Bevespi Aerosphere 9-4.8 MCG/ACT Aero Generic drug: Glycopyrrolate-Formoterol INHALE 2 PUFFS INTO THE LUNGS 2 TIMES A DAY What changed: See the new instructions.   Cholecalciferol 50 MCG (2000 UT) Tabs Take 1 tablet by mouth daily.   cyclobenzaprine 5 MG tablet Commonly known as: FLEXERIL Take 5 mg by mouth 2 (two) times daily as needed for muscle spasms.   feeding supplement Liqd Take 237 mLs by mouth 2 (two) times daily between meals. What changed: when to take this   folic acid 1 MG tablet Commonly known as: FOLVITE Take 1 mg by mouth daily.   gabapentin 300 MG capsule Commonly known as: NEURONTIN Take 600 mg by mouth at bedtime.     levETIRAcetam 500 MG tablet Commonly known as: KEPPRA Take 1 tablet (500 mg total) by mouth 2 (two) times daily.   multivitamin with minerals Tabs tablet Take 1 tablet by mouth daily.   naltrexone 50 MG tablet Commonly known as: DEPADE Take 50 mg by mouth daily.   pantoprazole 40 MG tablet Commonly known as: PROTONIX Take 40 mg by mouth daily.   predniSONE 20 MG tablet Commonly known as: DELTASONE Take 2 tablets (40 mg total) by mouth daily with breakfast for 3 days. Start taking on: March 05, 2020   spironolactone 25 MG tablet Commonly known as: ALDACTONE Take 25 mg by mouth daily.   Toprol XL 25 MG 24 hr tablet Generic drug: metoprolol succinate Take 25 mg by mouth daily.        Disposition and follow-up:   [name] was discharged from [facility] in Stable condition.  At the hospital follow up visit please address:  1.  COPD exacerbation: Patient improved w/ steroids. Patient to complete 5d course of abx.  2.  Labs / imaging needed at time of follow-up: CBC, BMP  3.  Pending labs/ test needing follow-up: n/a  Follow-up Appointments:  Follow-up Information     Schedule an appointment as soon as possible for a visit with Wanda Mires, MD.   Specialty: Family Medicine Why: Please call and make an appointment for a hospital follow-up this upcoming  week, 11/8-11/12. Thank you! Contact information: [place] [address] [phone]         [place].   Specialty: Emergency Medicine Why: If symptoms worsen Contact information: [address] [id number] [place] by problem list: 1. COPD exacerbation: Patient presented with one day hx dyspnea, denies fevers, cough, sick contacts. Initially improved with treatment in ED but suddenly became tachypneic to 30's. Remained  afebrile, HDS, no leukocytosis, CXR clear. Little concern for infection, d/c antibiotics. Patient improved with steroids, discharging with instructions to complete 5 day course of steroids and follow-up with PCP.  Discharge Vitals:   BP 134/72 (BP Location: Right Arm)   Pulse 79   Temp 98.5 F (36.9 C) (Oral)   Resp 16   Ht 5\' 7"  (1.702 m)   Wt 53.5 kg   LMP  (LMP Unknown)   SpO2 95%   BMI 18.48 kg/m   Pertinent Labs, Studies, and Procedures:  CBC Latest Ref Rng & Units 03/04/2020 03/03/2020 11/01/2018  WBC 4.0 - 10.5 K/uL 5.6 5.1 11.6(H)  Hemoglobin 12.0 - 15.0 g/dL 12.8 12.7 14.6  Hematocrit 36 - 46 % 40.0 41.3 47.0(H)  Platelets 150 - 400 K/uL 152 149(L) 137(L)   BMP Latest Ref Rng & Units 03/04/2020 03/03/2020 11/01/2018  Glucose 70 - 99 mg/dL 79 113(H) 88  BUN 8 - 23 mg/dL 24(H) 14 12  Creatinine 0.44 - 1.00 mg/dL 0.65 0.63 0.63  Sodium 135 - 145 mmol/L 138 143 142  Potassium 3.5 - 5.1 mmol/L 4.4 4.5 4.0  Chloride 98 - 111 mmol/L 94(L) 94(L) 100  CO2 22 - 32 mmol/L 34(H) 42(H) 34(H)  Calcium 8.9 - 10.3 mg/dL 9.1 9.3 8.8(L)   CXR 03/03/20: No acute cardiopulmonary abnormalities.  Discharge Instructions:    Wanda Prince,  I am glad you are feeling better! You were admitted for a COPD exacerbation. Thankfully you improved very quickly and are ready for discharge home with Wanda Prince! We are prescribing you a few more days of steroids and you will follow-up with your primary care doctor this week. Please see the following notes:   -Take prednisone (DELTASONE) 40mg , once daily with breakfast. Start taking on Sunday, 03/05/2020. Your last dose should be Tuesday, 03/07/2020.   -Please make sure to make an appointment with your primary care doctor this week for a hospital follow-up.   It was a pleasure to meet you, Wanda Prince. I wish you the best and hope you stay happy and healthy!   Thank you,  Sanjuan Dame, MD   Signed: Sanjuan Dame, MD 03/04/2020, 9:40 AM   Pager: [phone]

## 2020-03-04 NOTE — Progress Notes (Signed)
   Subjective:  Patient seen at bedside this AM. Patient reports she is feeling okay this morning. She reports that she is on 4L of oxygen at home. Endorses being "under the weather" a little previously but not like when she came in. She notes improvement in her dyspnea at this time. Very little cough. Denies fevers or chest pain. She is agreeable with discharge home today. She says her cat, Laverna Peace, misses her.    PCP: Dr Derrek Monaco  Objective:  Vital signs in last 24 hours: Vitals:   03/03/20 1700 03/03/20 2004 03/04/20 0301 03/04/20 0511  BP: 125/78 124/68  134/72  Pulse: 91 93  79  Resp: 15 14  16   Temp: 98.2 F (36.8 C) 99.1 F (37.3 C)  98.5 F (36.9 C)  TempSrc: Oral Oral  Oral  SpO2:  98% 99% 100%  Weight:      Height:       Physical Exam: General: Chronically ill-appearing, no acute distress Pulm: Increased WOB, distant breath sounds MSK: Thin, atrophied muscles Neuro: Alert, awake, oriented x3  Assessment/Plan: Wanda Prince is 64yo female with chronic hypoxic respirator failure requiring 2L O2 at home, COPD, HTN, seizure disorder admitted 11/5 with COPD exacerbation, now improved at back at baseline.  Principal Problem:   COPD exacerbation ([place]) Active Problems:   Seizure disorder ([place])   Essential hypertension   Hep C w/o coma, chronic (HCC)   Low weight  #Acute on chronic hypoxic hypercarbic respiratory distress 2/2 COPD exacerbation This morning patient doing well on 3L O2. Mentions dyspnea has improved since yesterday and feels much better. Reports chronic cough, no change from baseline. CXR on arrival without opacities. Patient has remained afebrile without leukocytosis. D/c'd azithromycin given low concern for infection. Will continue prednisone for total of five days. Patient to follow up this next week with PCP. Patient stable, ready for discharge. - C/w prednisone 40mg  (day 2/5) - DuoNebs q6 - C/w LABA/LAMA combo inhaler (Anoro while inpatient, will continue with  home Bevepsi on discharge)  #Deconditioning #Malnutrition Patient with chronically low BMI. PT/OT recommends Home Health PT. Patient to continue with home dietary supplements of Ensure and minerals.  Chronic: #Hypertension #Seizure disorder #GERD - C/w home medications  DIET: Regular IVF: n/a DVT PPX: Lovenox BOWEL: [place], Miralax CODE: FULL  Prior to Admission Living Arrangement: Home Anticipated Discharge Location: Home Barriers to Discharge: none Dispo: Anticipated discharge in approximately 0 day(s).   Wanda Dame, MD 03/04/2020, 8:20 AM Pager: [phone] After 5pm on weekdays and 1pm on weekends: On Call pager [phone]

## 2020-03-04 NOTE — Evaluation (Signed)
Occupational Therapy Evaluation and Discharge Patient Details Name: Wanda Prince MRN: [id number] DOB: 1955/07/16 Today's Date: 03/04/2020    History of Present Illness Pt is a 64 y/o female admitted for worsening SOB. Thought to be from COPD exacerbation. PMH includes HTN, COPD on supplemental oxygen, seizures, and substance abuse.    Clinical Impression   Pt lives alone and walks with a cane. Her sister takes her to the grocery store where she drives an Web designer. Pt presents with baseline decreased activity tolerance and increased work of breathing due to COPD. She denies any dizziness with ambulation in her room on 4L 02. Provided hand held assist in the absence of her cane. No OT needs.     Follow Up Recommendations  No OT follow up    Equipment Recommendations  None recommended by OT    Recommendations for Other Services       Precautions / Restrictions Precautions Precautions: Fall      Mobility Bed Mobility Overal bed mobility: Independent                  Transfers Overall transfer level: Modified independent                    Balance     Sitting balance-Leahy Scale: Normal       Standing balance-Leahy Scale: Fair                             ADL either performed or assessed with clinical judgement   ADL Overall ADL's : At baseline                                       General ADL Comments: Pt without dizziness this visit, ambulated with hand held assist in absence of her cane     Vision Baseline Vision/History: Wears glasses Patient Visual Report: No change from baseline       Perception     Praxis      Pertinent Vitals/Pain Pain Assessment: No/denies pain     Hand Dominance Right   Extremity/Trunk Assessment Upper Extremity Assessment Upper Extremity Assessment: Overall WFL for tasks assessed   Lower Extremity Assessment Lower Extremity Assessment: Overall WFL for tasks assessed    Cervical / Trunk Assessment Cervical / Trunk Assessment: Kyphotic   Communication Communication Communication: No difficulties   Cognition Arousal/Alertness: Awake/alert Behavior During Therapy: WFL for tasks assessed/performed Overall Cognitive Status: Within Functional Limits for tasks assessed                                     General Comments       Exercises     Shoulder Instructions      Home Living Family/patient expects to be discharged to:: Private residence Living Arrangements: Alone Available Help at Discharge: Family;Available PRN/intermittently Type of Home: Apartment Home Access: Stairs to enter Entrance Stairs-Number of Steps: 3 Entrance Stairs-Rails: None Home Layout: One level     Bathroom Shower/Tub: Teacher, early years/pre: Standard     Home Equipment: Cane - single point;Bedside commode;Shower seat;Other (comment) (02)          Prior Functioning/Environment Level of Independence: Independent with assistive device(s)        Comments: Uses cane  for ambulation. Uses 4L of O2        OT Problem List: Decreased activity tolerance;Impaired balance (sitting and/or standing)      OT Treatment/Interventions:      OT Goals(Current goals can be found in the care plan section) Acute Rehab OT Goals Patient Stated Goal: to go home to her cat  OT Frequency:     Barriers to D/C:            Co-evaluation              AM-PAC OT "6 Clicks" Daily Activity     Outcome Measure Help from another person eating meals?: None Help from another person taking care of personal grooming?: None Help from another person toileting, which includes using toliet, bedpan, or urinal?: None Help from another person bathing (including washing, rinsing, drying)?: None Help from another person to put on and taking off regular upper body clothing?: None Help from another person to put on and taking off regular lower body clothing?:  None 6 Click Score: 24   End of Session Nurse Communication: Other (comment) (CM about need for ride home)  Activity Tolerance:   Patient left: in bed;with call bell/phone within reach  OT Visit Diagnosis: Unsteadiness on feet (R26.81)                Time: 1000-1017 OT Time Calculation (min): 17 min Charges:  OT General Charges $OT Visit: 1 Visit OT Evaluation $OT Eval Moderate Complexity: 1 Mod  Nestor Lewandowsky, OTR/L Acute Rehabilitation Services Pager: [phone] Office: [phone]  Wanda Prince 03/04/2020, 10:23 AM

## 2020-03-04 NOTE — TOC Transition Note (Signed)
Transition of Care [facility]) - CM/SW Discharge Note   Patient Details  Name: Wanda Prince MRN: [id number] Date of Birth: 01/07/1956  [facility]) CM/SW Contact:  Claudie Leach, RN 03/04/2020, 1:03 PM   Clinical Narrative:    Patient to d/c home.  Met with patient at bedside to discuss d/c.  Patient requests phone to call family for transportation.  Patient states she has Home O2, but does not know company.  She has a large home concentrator and her sister lets her borrow a Marine scientist for going out.  She will ask sister to bring portable concentrator for ride home.  She states she needs a new walker.  RW ordered for delivery to room at 1300.  Lucretia notified.  Patient states she would like to do outpatient PT and she can manage transportation there.  This is a better option for patient than HH due to Medicaid.  Patient referred to outpatient PT.

## 2020-03-05 NOTE — Progress Notes (Signed)
DISCHARGE NOTE HOME LUV MISH to be discharged to home per MD order. Discussed prescriptions and follow up appointments with the patient. Prescriptions given to patient; medication list explained in detail. Patient verbalized understanding.Spoke to her sister about the need to bring patient's home O2.  Skin clean, dry and intact without evidence of skin break down, no evidence of skin tears noted. IV catheter discontinued intact. Site without signs and symptoms of complications. Dressing and pressure applied. Pt denies pain at the site currently. No complaints noted.  Patient free of lines, drains, and wounds.   An After Visit Summary (AVS) was printed and given to the patient and her sister.Wheel delivered into her room and was given to patient's sister to home.Remided patient's sister to get her medication from [place]. Patient escorted via wheelchair, and discharged home via private auto.  [place], Zenon Mayo, RN

## 2020-03-10 ENCOUNTER — Telehealth (HOSPITAL_COMMUNITY): Payer: Self-pay

## 2020-03-10 NOTE — Telephone Encounter (Signed)
Pt insurance is active and benefits verified through [facility] Medicaid Co-pay 0, DED 0/0 met, out of pocket 0/0 met, co-insurance 0%. no pre-authorization required. Passport, [place]/[employer] 03/10/2020@11 :23am, REF# [id number]  Pulmonary rehab: 15 visits per 6 consecutive months.

## 2020-03-21 ENCOUNTER — Telehealth: Payer: Self-pay

## 2020-03-21 NOTE — Telephone Encounter (Signed)
Attempted to contact patient and patient's sister Vickii Chafe to schedule Palliative care consult. Left message to return call.

## 2020-03-22 ENCOUNTER — Telehealth: Payer: Self-pay

## 2020-03-22 NOTE — Telephone Encounter (Signed)
Spoke with patient's sister Vickii Chafe and have scheduled an In-person Consult for 04/07/20 @ 9:00AM   COVID screening was negative. Patient has a dog in home. Patient lives with sister at this time.    Consent obtained; updated Outlook/Netsmart/Team List and Epic.

## 2020-03-31 ENCOUNTER — Encounter (HOSPITAL_COMMUNITY): Payer: Self-pay

## 2020-03-31 NOTE — Telephone Encounter (Signed)
Attempted to call patient in regards to Pulmonary Rehab - LM on VM Mailed letter 

## 2020-04-03 IMAGING — MG DIGITAL SCREENING BILAT W/ TOMO W/ CAD
6 of 10 series · 6 of 30 positions shown · non-contrast
Comparison: Previous exam(s).

CLINICAL DATA: Screening.

EXAM:
DIGITAL SCREENING BILATERAL MAMMOGRAM WITH TOMO AND CAD

[L MLO synth-2D]
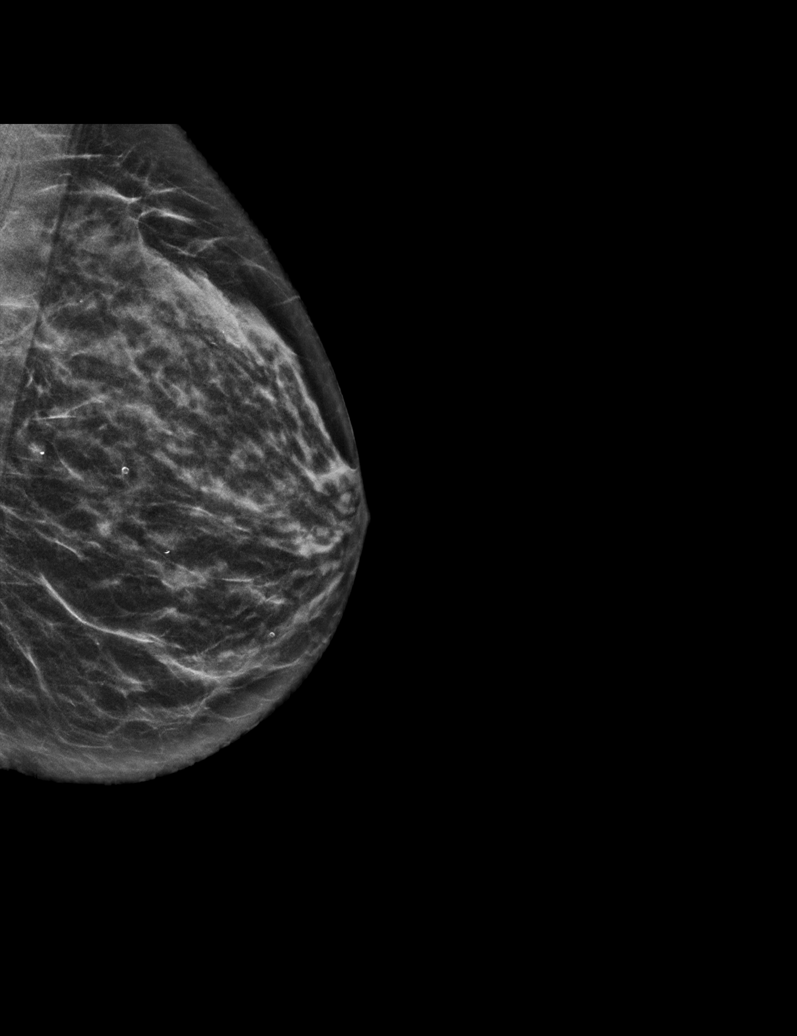

[R MLO synth-2D]
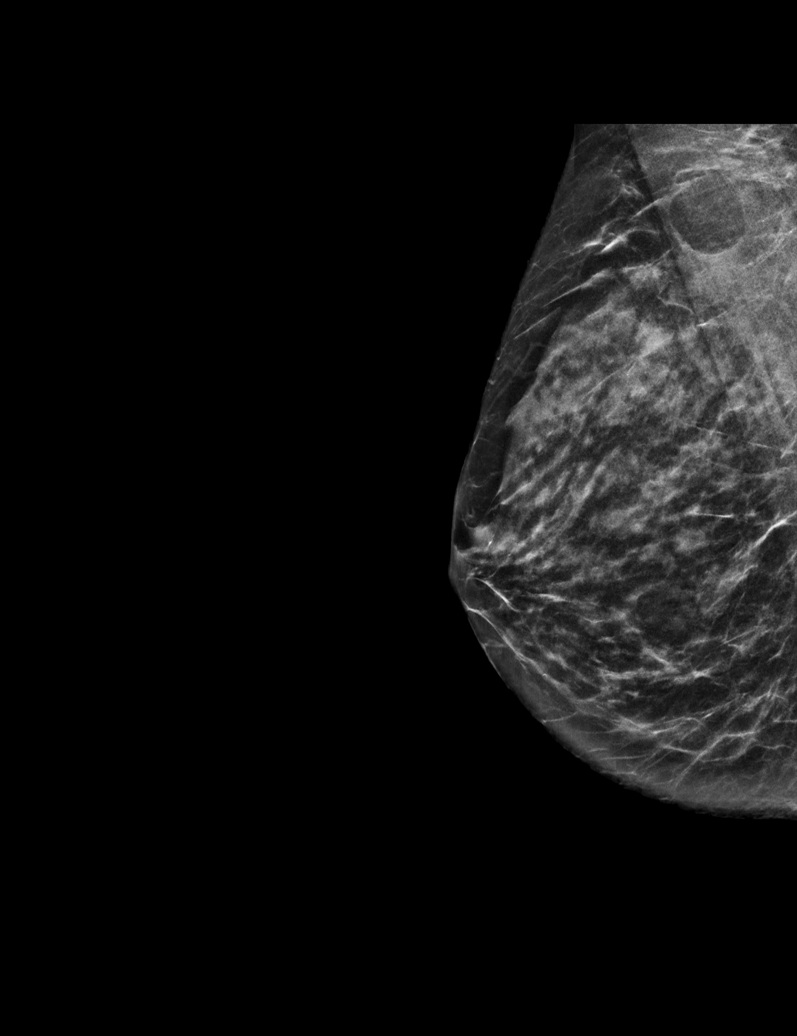

[L CC synth-2D (1 of 2)]
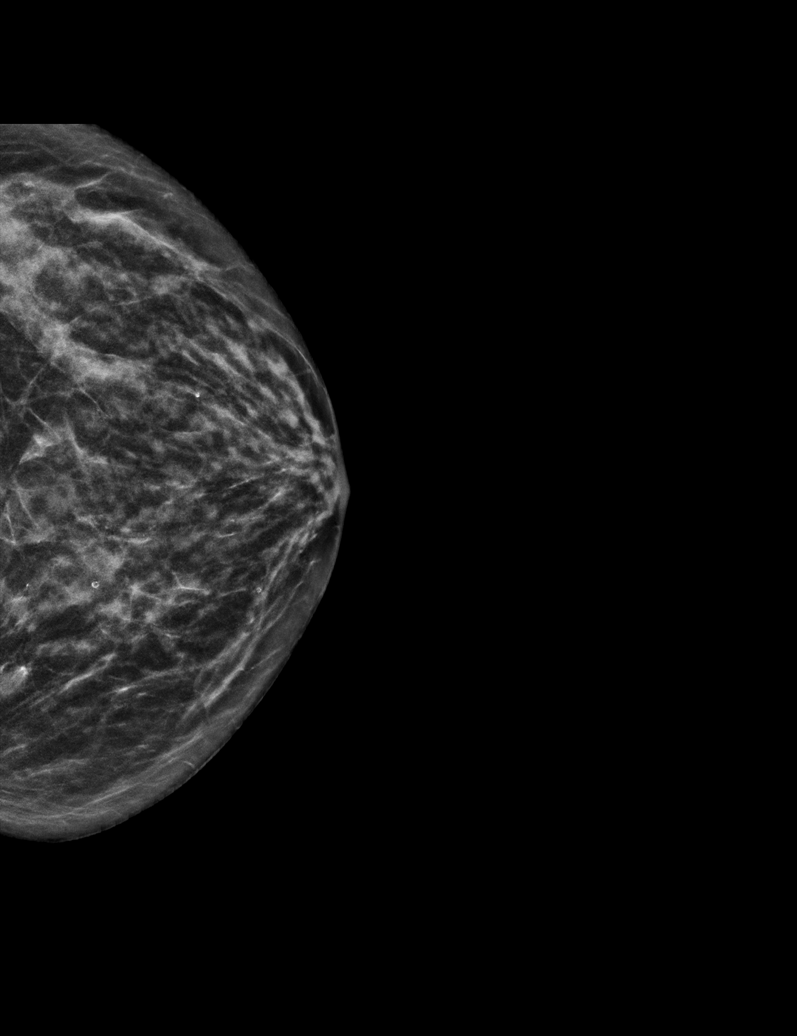

[L CC synth-2D (2 of 2)]
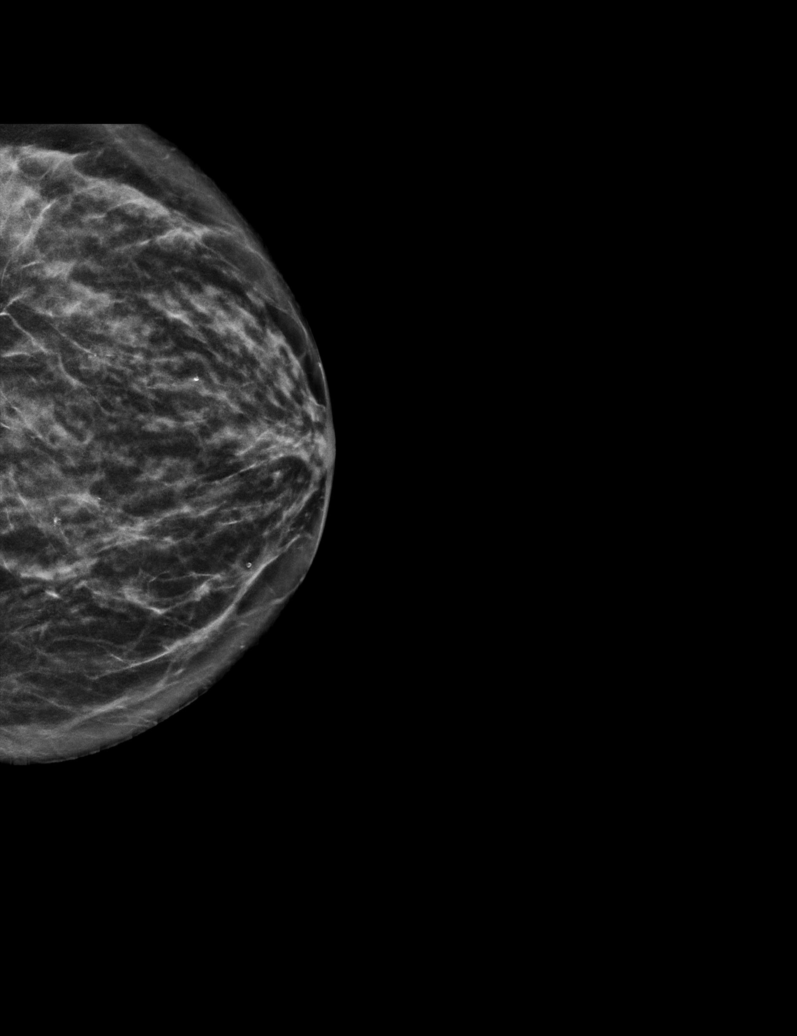

[R CC synth-2D]
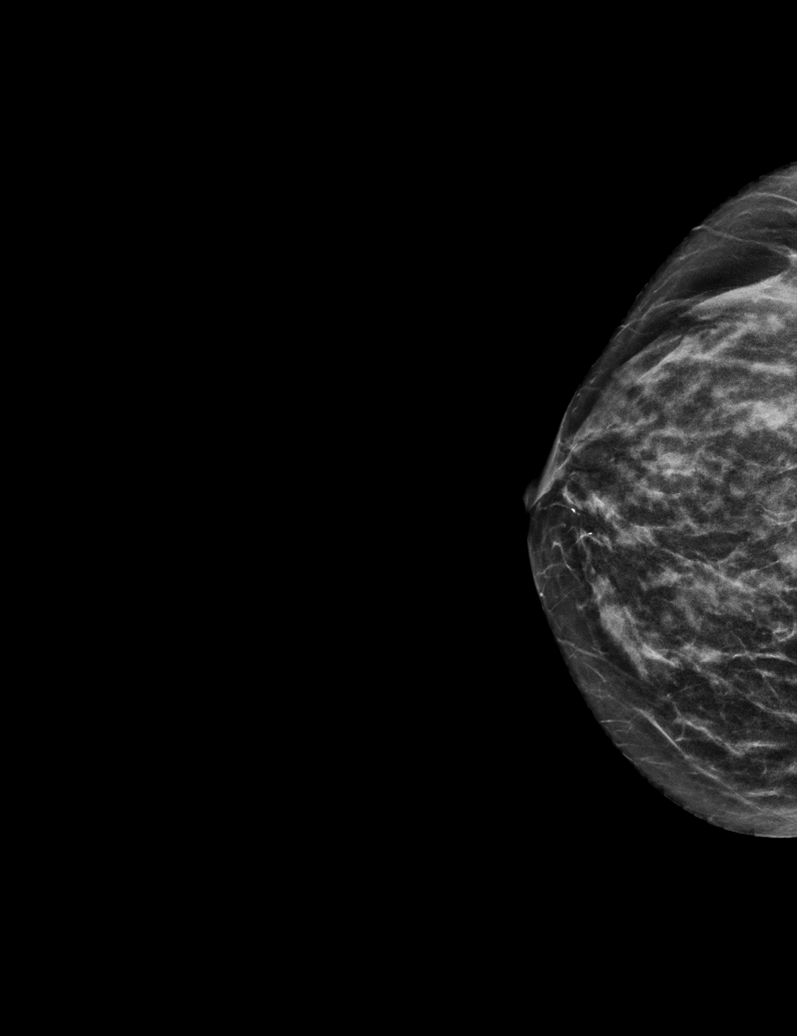

[R CC tomo · tomo slice 23/45.0]
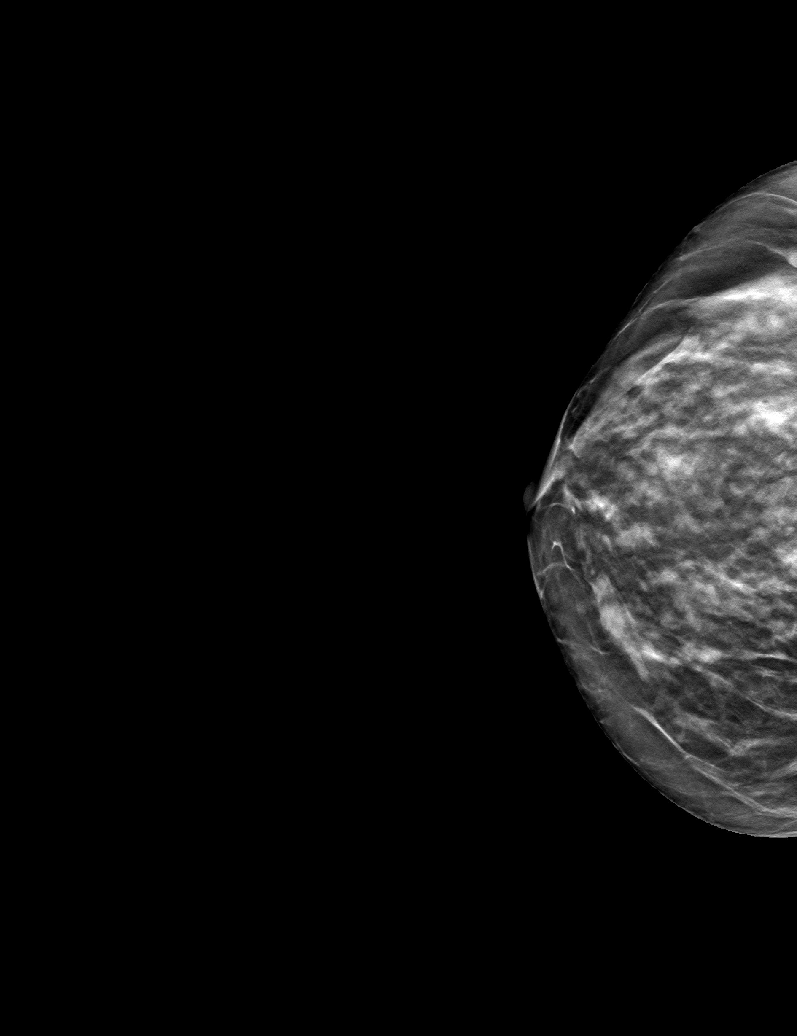

[6 of 30 positions shown; findings below may reference images not displayed]

ACR Breast Density Category c: The breast tissue is heterogeneously
dense, which may obscure small masses.
FINDINGS: There are no findings suspicious for malignancy. Images were
processed with CAD.
IMPRESSION: No mammographic evidence of malignancy. A result letter of this
screening mammogram will be mailed directly to the patient.

RECOMMENDATION:
Screening mammogram in one year. (Code:[id number])

BI-RADS CATEGORY  1: Negative.

## 2020-04-07 ENCOUNTER — Other Ambulatory Visit: Payer: Medicaid Other | Admitting: Nurse Practitioner

## 2020-04-07 ENCOUNTER — Encounter: Payer: Self-pay | Admitting: Internal Medicine

## 2020-04-07 ENCOUNTER — Ambulatory Visit (INDEPENDENT_AMBULATORY_CARE_PROVIDER_SITE_OTHER): Payer: Medicaid Other | Admitting: Internal Medicine

## 2020-04-07 ENCOUNTER — Other Ambulatory Visit: Payer: Self-pay

## 2020-04-07 DIAGNOSIS — J9611 Chronic respiratory failure with hypoxia: Secondary | ICD-10-CM

## 2020-04-07 DIAGNOSIS — J449 Chronic obstructive pulmonary disease, unspecified: Secondary | ICD-10-CM | POA: Diagnosis not present

## 2020-04-07 DIAGNOSIS — Z515 Encounter for palliative care: Secondary | ICD-10-CM

## 2020-04-07 MED ORDER — PREDNISONE 5 MG PO TABS: 5.0000 mg | ORAL_TABLET | Freq: Every day | ORAL | 2 refills | Status: AC

## 2020-04-07 NOTE — Patient Instructions (Signed)
Plan A = Automatic = Always=    bevespi Take 2 puffs first thing in am and then another 2 puffs about 12 hours later.   And prednisone 5 mg daily with breakfast   Plan B = Backup (to supplement plan A, not to replace it) Only use your albuterol inhaler (PROAIR) as a rescue medication to be used if you can't catch your breath by resting or doing a relaxed purse lip breathing pattern.  - The less you use it, the better it will work when you need it. - Ok to use the inhaler up to 2 puffs  every 4 hours if you must but call for appointment if use goes up over your usual need - Don't leave home without it !!  (think of it like the spare tire for your car)   Plan C = Crisis (instead of Plan B but only if Plan B stops working) - only use your albuterol nebulizer if you first try Plan B and it fails to help > ok to use the nebulizer up to every 4 hours but if start needing it regularly call for immediate appointment   Plan D = Deltasone (if ABC not working well)  - double the dose until better then back to one daily   Please schedule a follow up visit in 6 months but call sooner if needed.

## 2020-04-07 NOTE — Assessment & Plan Note (Signed)
Quit smoking as of early Oct 2020   - trial of Anoro  05/18/15 > better doe  06/01/2015  - Spirometry 06/01/2015  FEV1 0.54 (19%)  Ratio 39   - PFT's  07/24/2015  FEV1 0.65 (21 % ) ratio 29  p no % improvement from saba p anoro am prior to study with DLCO  12 % corrects to 23 % for alv volume   - 06/17/17 changed to bevespi due insurance - prednisone 20 ub then 10 maint added to rx 02/23/2018  04/30/2018  After extensive coaching inhaler device,  effectiveness =  75% with Bevespi (short Ti ) - reset floor for prednisone at 5 mg daily 09/02/2018      Group D in terms of symptom/risk and laba/lama/ICS  therefore appropriate rx at this point >>>  bevespi plus prednisone 5 mg floor, 10 mg ceiling will suffice   Re saba: I spent extra time with pt today reviewing appropriate use of albuterol for prn use on exertion with the following points: 1) saba is for relief of sob that does not improve by walking a slower pace or resting but rather if the pt does not improve after trying this first. 2) If the pt is convinced, as many are, that saba helps recover from activity faster then it's easy to tell if this is the case by re-challenging : ie stop, take the inhaler, then p 5 minutes try the exact same activity (intensity of workload) that just caused the symptoms and see if they are substantially diminished or not after saba 3) if there is an activity that reproducibly causes the symptoms, try the saba 15 min before the activity on alternate days   If in fact the saba really does help, then fine to continue to use it prn but advised may need to look closer at the maintenance regimen being used to achieve better control of airways disease with exertion.

## 2020-04-07 NOTE — Progress Notes (Signed)
Subjective:  Patient ID: Wanda Prince, female   DOB: 10/16/1955    MRN: [id number]    Brief patient profile:  63 yowf active smoker  with deteriorating sob x weeks > admit   Date of Admission: 04/22/2015   Date of Discharge: 05/07/2015 Attending Physician: Aldine Contes, MD  Discharge Diagnosis: Active Problems:  COPD with acute exacerbation ([place])  COPD exacerbation ([place])  Acute respiratory failure with hypoxemia ([place])  Encounter for imaging study to confirm orogastric (OG) tube placement  Encounter for nasogastric (NG) tube placement  Acute respiratory failure ([place])  Lung mass  Liver mass   05/18/2015 1st [facility] office visit/ Amica Harron  maint rx spiriva/advair but very poor technique  Chief Complaint  Patient presents with  . Pulmonary Consult    Referred by Dr. Suzanna Obey. Pt states that she was dxed with COPD years ago. She c/o increased SOB for the past month. She is SOB just sitting and doing nothing and unable to exert herself much due to SOB.   baseline = used hc parking / sometimes used scooter sometimes not  / now sob at rest  Has flutter not using/ very congested cough but no purulent mucus  Sleeps on 2 pillows ok Very poor insight into meds/ flutter/ purse lip  rec Pantoprazole (protonix) 40 mg   Take  30-60 min before first meal of the day and Pepcid (famotidine)  20 mg one @  bedtime until return to office - this is the best way to tell whether stomach acid is contributing to your problem.   GERD  Diet   Stop advair and spiriva and just take anoro 2 good drags each am / one click  For breathing >  Nebulizer with albuterol every 4 hours if needed (if you can't catch your breath)  For cough > flutter valve as much as you can   02/12/2017  f/u ov/Faydra Korman re:   GOLD IV/ still smoking/ using saba sev times a day  Chief Complaint  Patient presents with  . Follow-up    SOB with activity, weight loss greater than 15 pounds in 6 months   doe still = MMRC3  = can't walk 100 yards even at a slow pace at a flat grade s stopping due to sob  s 02/ HT but not wm leaning  Sleeping ok 2 pillows o/w flat  s am congestion   appetite good / eats well but wt loss/ no fever or sweats  rec Work on inhaler technique:  relax and gently blow all the way out then take a nice smooth deep breath back in, triggering the inhaler at same time you start breathing in.  Hold for up to 5 seconds if you can. Blow out thru nose. Rinse and gargle with water when done Plan A = Automatic = Anoro each am  Plan B = Backup Only use your albuterol(Proair=RED   Yellow= Proventil)  as a rescue medication to be used if you can't catch your breath by resting or doing a relaxed purse lip breathing pattern.  - The less you use it, the better it will work when you need it. - Ok to use the inhaler up to 2 puffs  every 4 hours if you must but call for appointment if use goes up over your usual need - Don't leave home without it !!  (think of it like the spare tire for your car)  Plan C = Crisis - only use your albuterol nebulizer if you  first try Plan B   Keep working on cutting down on smoking > The key is to stop smoking completely before smoking completely stops you!     02/23/2018  f/u ov/Freddrick Gladson re: gold iv/ still smoking/ maint rx = bevespi Chief Complaint  Patient presents with  . Follow-up    Breathing has been worse over the past few months. She is using her albuterol inhaler 4 x daily on average and neb about 2 x per wk.    Dyspnea:  50 ft = MMRC4  = sob if tries to leave home or while getting dressed   Cough: min Sleeping: on side horizontal rec Prednisone 10 mg x 2 each am with breakfast until better, then try one a day until return The key is to stop smoking completely before smoking completely stops you!     04/30/2018  f/u ov/Meiya Wisler re:  GOLD IV / still smoking  On prednisone 10 mg  Chief Complaint  Patient presents with  . Follow-up    Breathing has been progressively  worse. She plans to quit smoking today and is requesting patches.    Dyspnea:  MMRC4  = sob if tries to leave home or while getting dressed  Though some better on prednisone  Vs prior  Cough: no Sleeping: on flat bed/ 2 pillows  SABA use: 3 x daily if active/ not much is stays still  02: none  rec Goal for 02 delivery is to keep it above 90% by adjusting the flow up down or off as long as above 90%  Prednisone 10 mg 2 daily until better then 1 daily as maintenance therapy, if get worse, go back to 2 daily Plan A = Automatic = Bevespi Take 2 puffs first thing in am and then another 2 puffs about 12 hours later and prednisone 10mg  daily  Plan B = Backup Only use your albuterol inhaler as a rescue medication Plan C = Crisis - only use your albuterol nebulizer if you first try Plan B and it fails to help   04/07/2020  f/u ov/Sasuke Yaffe re: GOLD IV / 02 dep/ pred dep at 5 mg daily off cigs x 2 y Chief Complaint  Patient presents with  . Follow-up    Breathing has improved some- relates to taking prednisone- Dr Doreene Nest started pt on 5 mg daily. She is using her albuterol inhaler 3 x per day and she rarely uses neb.   Dyspnea:  Walking room to room but freq stops / 2lpm only as circuits won't tolerate higher levels s breakers flipping Cough: min rattle in ams Sleeping: flat bed/ 2 pillows  SABA use: less  02: 2lpm 24/7    No obvious day to day or daytime variability or assoc excess/ purulent sputum or mucus plugs or hemoptysis or cp or chest tightness, subjective wheeze or overt sinus or hb symptoms.   Sleeping as above without nocturnal  exacerbation  of respiratory  c/o's or need for noct saba. Also denies any obvious fluctuation of symptoms with weather or environmental changes or other aggravating or alleviating factors except as outlined above   No unusual exposure hx or h/o childhood pna/ asthma or knowledge of premature birth.  Current Allergies, Complete Past Medical History, Past  Surgical History, Family History, and Social History were reviewed in [employer] record.  ROS  The following are not active complaints unless bolded Hoarseness, sore throat, dysphagia, dental problems, itching, sneezing,  nasal congestion or discharge of excess mucus  or purulent secretions, ear ache,   fever, chills, sweats, unintended wt loss or wt gain, classically pleuritic or exertional cp,  orthopnea pnd or arm/hand swelling  or leg swelling, presyncope, palpitations, abdominal pain, anorexia, nausea, vomiting, diarrhea  or change in bowel habits or change in bladder habits, change in stools or change in urine, dysuria, hematuria,  rash, arthralgias, visual complaints, headache, numbness, weakness or ataxia or problems with walking or coordination,  change in mood or  memory.        Current Meds  Medication Sig  . acetaminophen (TYLENOL) 500 MG tablet Take 1,000 mg by mouth every 6 (six) hours as needed for pain.  Marland Kitchen albuterol (PROVENTIL HFA;VENTOLIN HFA) 108 (90 BASE) MCG/ACT inhaler Inhale 2 puffs into the lungs every 4 (four) hours as needed for wheezing or shortness of breath.   Marland Kitchen albuterol (PROVENTIL) (2.5 MG/3ML) 0.083% nebulizer solution USE 1 VIAL IN NEBULIZER EVERY 4 HOURS AS NEEDED FOR WHEEZING OR SHORTNESS OF BREATH (Patient taking differently: Take 2.5 mg by nebulization every 4 (four) hours as needed for wheezing or shortness of breath.)  . aspirin EC 81 MG tablet Take 81 mg by mouth daily.  Marland Kitchen b complex vitamins tablet Take 1 tablet by mouth 2 (two) times daily.  Marland Kitchen BEVESPI AEROSPHERE 9-4.8 MCG/ACT AERO INHALE 2 PUFFS INTO THE LUNGS 2 TIMES A DAY (Patient taking differently: Inhale 2 puffs into the lungs in the morning and at bedtime.)  . Cholecalciferol 50 MCG (2000 UT) TABS Take 1 tablet by mouth daily.  . cyclobenzaprine (FLEXERIL) 5 MG tablet Take 5 mg by mouth 2 (two) times daily as needed for muscle spasms.   . feeding supplement, ENSURE ENLIVE, (ENSURE  ENLIVE) LIQD Take 237 mLs by mouth 2 (two) times daily between meals. (Patient taking differently: Take 237 mLs by mouth 3 (three) times daily between meals.)  . folic acid (FOLVITE) 1 MG tablet Take 1 mg by mouth daily.  Marland Kitchen gabapentin (NEURONTIN) 300 MG capsule Take 600 mg by mouth at bedtime.   . levETIRAcetam (KEPPRA) 500 MG tablet Take 1 tablet (500 mg total) by mouth 2 (two) times daily.  . metoprolol succinate (TOPROL-XL) 25 MG 24 hr tablet Take 25 mg by mouth daily.  . Multiple Vitamin (MULITIVITAMIN WITH MINERALS) TABS Take 1 tablet by mouth daily.  . naltrexone (DEPADE) 50 MG tablet Take 50 mg by mouth daily.  . pantoprazole (PROTONIX) 40 MG tablet Take 40 mg by mouth daily.   . predniSONE (DELTASONE) 5 MG tablet Take 5 mg by mouth daily with breakfast.  . spironolactone (ALDACTONE) 25 MG tablet Take 25 mg by mouth daily.              Objective:  Physical Exam:   w/c bound elderly wf with gen severe muscle wasting    04/07/2020    100 04/30/2018        89  02/23/2018    87  11/17/2017      92  08/18/2017       96  05/16/2017       100  02/12/2017      97  03/18/2016      106  07/24/2015        109   06/01/2015         112   05/07/15 105 lb 4.8 oz (47.764 kg)  01/12/14 115 lb 15.4 oz (52.6 kg)  09/29/12 135 lb (61.236 kg)      Vital signs reviewed  04/07/2020  - Note at rest 02 sats  97% on 2lpm  POC   HEENT : pt wearing mask not removed for exam due to covid -19 concerns.    NECK :  without JVD/Nodes/TM/ nl carotid upstrokes bilaterally   LUNGS: no acc muscle use,  Mod barrel  contour chest wall with bilateral  Distant bs s audible wheeze and  without cough on insp or exp maneuvers and mod  Hyperresonant  to  percussion bilaterally     CV:  RRR  no s3 or murmur or increase in P2, and no edema   ABD:  soft and nontender with pos mid insp Hoover's  in the supine position. No bruits or organomegaly appreciated, bowel sounds nl  MS:     ext warm with gen muscule  atrophy/ severe    - no  calf tenderness, cyanosis or clubbing No obvious joint restrictions   SKIN: warm and dry without lesions    NEURO:  alert, approp, nl sensorium with  no motor or cerebellar deficits apparent.          I personally reviewed images and agree with radiology impression as follows:  CXR:   03/03/20  1. Emphysema. 2. No acute cardiopulmonary abnormalities       Assessment:

## 2020-04-07 NOTE — Assessment & Plan Note (Signed)
2lpm since d/c 07/29/2018 for aecopd   Adequate control on present rx, reviewed in detail with pt > no change in rx needed  > can't titrate up as circuits can't tol the higher amperage so advised slower pace, focus on nasal breathing          Each maintenance medication was reviewed in detail including emphasizing most importantly the difference between maintenance and prns and under what circumstances the prns are to be triggered using an action plan format where appropriate.  Total time for H and P, chart review, counseling, reviewing devices and generating customized AVS unique to this office visit / charting = 28 min

## 2020-04-07 NOTE — Progress Notes (Signed)
`q s   [facility] Note Telephone: [phone]  Fax: [phone]  PATIENT NAME: Wanda Prince [address] [place] [address] [phone] (home)  DOB: 09/21/1955 MRN: [id number]  PRIMARY CARE PROVIDER:    Katherina Mires, MD,  [address] [place] [address] [phone]   Dr. Melvyn Novas (Pulmonologist)  REFERRING PROVIDER:   Katherina Mires, MD [address] [place],  [place] [address] [phone]  RESPONSIBLE PARTY:   Extended Emergency Contact Information Primary Emergency Contact: Susanne Borders, [place] [address] Johnnette Litter of [place] Mobile Phone: [phone] Relation: Sister Jackelyn Poling (duaghter): [phone]  I met face to face with patient and family in home.   ASSESSMENT AND RECOMMENDATIONS:   Advance Care Planning: Today's visit consisted of building trust and discussions on palliative care medicine as a specialized medical care for people living with serious illness, aimed at facilitating better quality of life through symptoms relief, assisting with advance care planning and establishing goals of care. Patient's sister Vickii Chafe who is her main care giver present at visit, she expressed appreciation for education provided on Palliative care and how it differs from Hospice service.  Goal of care: Patients goal of care is function. Patient want to be able to stay in her home. He goal is to regain enough strength and get some assistance so she can remain in her home. Patient want to stay in her home at her end of life, she does not want to live in a long term care facility. Directives: After discussion on ramnification and implication of  decision regarding Code Status, patient decided to change her code status to Do Not Resuscitate. Patient verbalized desire to not be resuscitated if she is found without life or in the event of cardiac or respiratory arrest.  She does not want to be kept alive by artificial means. Two DNR forms signed for patient as patient splits her time  living with two of her sisters, one form to be left in patient's home and the other go with patient when going to her sister's homes. The need to complete a MOST form was discussed, patient and her sister Vickii Chafe expressed interest in completing a MOST form. Discussed and reviewed sections of the form in detail, opportunity for questions given, all questions answered. Form completed and signed with patient and her sister. Signed form given to sister to keep, copy of MOST and DNR forms uploaded to [facility] EMR. Details of MOST include; DNR/no CPR, limited additional intervention, determine use and limitation of antibiotics, IVF for defined trial period, Feeding tube for defined trial period.    Symptom Management:  Chronic respiratory failure with hypoxia: Patient oxygen dependent related to end stage COPD. She is dyspneic on presentation, oxygen saturation 95% on 2 L. Family report patient dyspneic at baseline, report patient desaturates with slight activity, saturation drops to low 80s. Patient is followed by Dr. Melvyn Novas of [facility], she has an appointment scheduled for this afternoon. Patient current medication regimen include Bevespi Aerosphere, Abuterol nebulizer and inhaler, she was started on Prednisone 62m daily by her PCP. Patient tolerating regimen without and complaints. No report of fever, chills, acute cough or increased sputum production.  Frailty: Patient appeared chronically ill. Family report activity intolerance, requires extensive assistance completing her ADLs. Family asked if Palliative care provides personal care giver support, family made aware that palliative care  does not provide personal care assistance but could link patient to community resources. Social work will be consulted to assist with her social needs including enrollment for meals on wheels. Family  requested for physical therapy for balance and strengthening, would recommend PT evaluation and treat for strengthening, will defer decision to patient's PCP. Patient's sister said she is working with someone with [facility] care to get some community assistance for patient, Vickii Chafe unsure what type of assistance he is working on. Palliative care will continue to provide support to patient, family and the medical team.  Palliative Care Visit: Palliative care will continue to follow for goals of care clarification and symptom management. Return in about 6 weeks or prn.  Family /Caregiver/Community Supports: Patient lives at home by self, she recently has been splitting her time in her sisters homes as she was unable to care for herself at home. She is divorced with a grown daughter and a 6 yr old granddaughter. Her daughter not very involved in her care.  Cognitive / Functional decline: Patient awake, alert, and coherent. Patient able to make her own decisions, have short term memory loss related to history of brain aneurysm at age of 44 or 42 years. She requires extensive assistance with her ADLs. She ambulates with a cane, limited activity due to activity intolerance, no report of recent falls. Patient on soft diet due to ill fitting dentures.  I spent 90 minutes providing this consultation, time includes time spent with patient and family, chart review, provider coordination, and documentation. More than 50% of the time in this consultation was spent counseling and coordinating communication.   CHIEF COMPLAINT: Initial Palliative care consult  HISTORY OF PRESENT ILLNESS:  Wanda Prince is a 64 y.o. year old female with multiple medical problems including COPD, hx of ETOH abuse, hx of drug use, hepatocellular cancer, remote history of breast cancer, quit smoking in 2020. Palliative Care was asked to follow this patient by consultation request of Katherina Mires, MD to help address advance care planning  and goals of care.   CODE STATUS: DNR  PPS: 40%  HOSPICE ELIGIBILITY/DIAGNOSIS: TBD  PHYSICAL EXAM / ROS:   Current and past weights: 100lb, Ht 5 7', BMI 15.66kg/m2 General: chronically ill and frail appearing, cachetic Cardiovascular: denied chest pain, denied palpitation, no edema  Pulmonary: SOB at rest, no cough, 95% on 2L via nasal canula GI:  appetite poor, denied constipation, continent of bowel GU: denies dysuria, continent of urine MSK:  no joint and ROM abnormalities, ambulatory Skin: no rashes or wounds reported or noted on exposed skin Neurological: Weakness, but otherwise nonfocal  PAST MEDICAL HISTORY:  Past Medical History:  Diagnosis Date  . 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 COPD (chronic obstructive pulmonary disease) ([place])   . H/O tracheostomy 04/22/2015   respiratory destress   . History of acute alcoholic hepatitis   . History of alcohol abuse   . History of cocaine abuse ([place])   . History of iron deficiency   . Hx of ventricular shunt 2009  . Hypertension   . Left breast mass   . Major depressive disorder    History of  . Meningitis 2009  . Primary cancer of right upper lobe of lung ([place]) 07/12/2015  . Psychiatric diagnosis    Multiple  . Respiratory arrest ([place])    secondary  to Subependymal abscess  . Seizure disorder ([place])   . Seizures ([place])   . Suicide attempt [facility])    History of suicide attempts August 2006 and July 2006    SOCIAL HX:  Social History   Tobacco Use  . Smoking status: Former Smoker    Packs/day: 1.00    Years: 40.00    Pack years: 40.00    Types: Cigarettes    Quit date: 2020    Years since quitting: 1.9  . Smokeless tobacco: Never Used  Substance Use Topics  . Alcohol use: Yes    Alcohol/week: 8.0 standard drinks    Types: 8 Cans of beer per week    Comment: per week    FAMILY HX:  Family History  Problem Relation Age of Onset  . Coronary artery disease Father   . Breast cancer Maternal Aunt 60    ALLERGIES: No Known Allergies   PERTINENT MEDICATIONS:  Outpatient Encounter Medications as of 04/07/2020  Medication Sig  . acetaminophen (TYLENOL) 500 MG tablet Take 1,000 mg by mouth every 6 (six) hours as needed for pain.  Marland Kitchen albuterol (PROVENTIL HFA;VENTOLIN HFA) 108 (90 BASE) MCG/ACT inhaler Inhale 2 puffs into the lungs every 4 (four) hours as needed for wheezing or shortness of breath.   Marland Kitchen albuterol (PROVENTIL) (2.5 MG/3ML) 0.083% nebulizer solution USE 1 VIAL IN NEBULIZER EVERY 4 HOURS AS NEEDED FOR WHEEZING OR SHORTNESS OF BREATH (Patient taking differently: Take 2.5 mg by nebulization every 4 (four) hours as needed for wheezing or shortness of breath. )  . aspirin EC 81 MG tablet Take 81 mg by mouth daily.  Marland Kitchen b complex vitamins tablet Take 1 tablet by mouth 2 (two) times daily.  Marland Kitchen BEVESPI AEROSPHERE 9-4.8 MCG/ACT AERO INHALE 2 PUFFS INTO THE LUNGS 2 TIMES A DAY (Patient taking differently: Inhale 2 puffs into the lungs in the morning and at bedtime. )  . Cholecalciferol 50 MCG (2000 UT) TABS Take 1 tablet by mouth daily.  . cyclobenzaprine (FLEXERIL) 5 MG tablet Take 5 mg by mouth 2 (two) times daily as needed for muscle spasms.   . feeding supplement, ENSURE ENLIVE, (ENSURE ENLIVE) LIQD Take 237 mLs by mouth 2 (two) times daily between meals. (Patient taking differently: Take 237 mLs by mouth 3 (three) times daily between meals. )  . folic acid (FOLVITE) 1 MG tablet Take 1 mg by mouth daily.  Marland Kitchen gabapentin (NEURONTIN) 300 MG capsule Take 600 mg by mouth at bedtime.   . levETIRAcetam (KEPPRA) 500 MG tablet Take 1 tablet (500 mg total) by mouth 2 (two) times daily.  . metoprolol succinate (TOPROL XL) 25 MG 24 hr tablet Take 25 mg by mouth daily.   . Multiple Vitamin (MULITIVITAMIN WITH MINERALS) TABS Take 1 tablet by mouth daily.  . naltrexone (DEPADE)  50 MG tablet Take 50 mg by mouth daily.  . pantoprazole (PROTONIX) 40 MG tablet Take 40 mg by mouth daily.   Marland Kitchen spironolactone (ALDACTONE) 25 MG tablet Take 25 mg by mouth daily.   No facility-administered encounter medications on file as of 04/07/2020.     Jari Favre, DNP, AGPCNP-BC

## 2020-04-17 ENCOUNTER — Telehealth (HOSPITAL_COMMUNITY): Payer: Self-pay

## 2020-04-17 ENCOUNTER — Ambulatory Visit: Payer: Medicaid Other | Admitting: Internal Medicine

## 2020-04-17 NOTE — Telephone Encounter (Signed)
No response from pt.  Closed referral  

## 2020-06-01 ENCOUNTER — Other Ambulatory Visit: Payer: Self-pay | Admitting: Internal Medicine

## 2020-06-27 ENCOUNTER — Other Ambulatory Visit: Payer: Self-pay | Admitting: Family Medicine

## 2020-06-27 DIAGNOSIS — Z1231 Encounter for screening mammogram for malignant neoplasm of breast: Secondary | ICD-10-CM

## 2020-07-11 ENCOUNTER — Emergency Department (HOSPITAL_COMMUNITY)
Admission: EM | Admit: 2020-07-11 | Discharge: 2020-07-11 | Disposition: A | Discharge status: Home / Self Care | Payer: Medicaid Other | Attending: Emergency Medicine | Admitting: Emergency Medicine

## 2020-07-11 ENCOUNTER — Emergency Department (HOSPITAL_COMMUNITY): Payer: Medicaid Other

## 2020-07-11 ENCOUNTER — Other Ambulatory Visit: Payer: Self-pay

## 2020-07-11 DIAGNOSIS — J441 Chronic obstructive pulmonary disease with (acute) exacerbation: Secondary | ICD-10-CM | POA: Insufficient documentation

## 2020-07-11 DIAGNOSIS — Z20822 Contact with and (suspected) exposure to covid-19: Secondary | ICD-10-CM | POA: Insufficient documentation

## 2020-07-11 DIAGNOSIS — Z87891 Personal history of nicotine dependence: Secondary | ICD-10-CM | POA: Insufficient documentation

## 2020-07-11 DIAGNOSIS — I110 Hypertensive heart disease with heart failure: Secondary | ICD-10-CM | POA: Diagnosis not present

## 2020-07-11 DIAGNOSIS — Z7951 Long term (current) use of inhaled steroids: Secondary | ICD-10-CM | POA: Insufficient documentation

## 2020-07-11 DIAGNOSIS — Z79899 Other long term (current) drug therapy: Secondary | ICD-10-CM | POA: Diagnosis not present

## 2020-07-11 DIAGNOSIS — R0902 Hypoxemia: Secondary | ICD-10-CM

## 2020-07-11 DIAGNOSIS — I5032 Chronic diastolic (congestive) heart failure: Secondary | ICD-10-CM | POA: Insufficient documentation

## 2020-07-11 DIAGNOSIS — Z7982 Long term (current) use of aspirin: Secondary | ICD-10-CM | POA: Insufficient documentation

## 2020-07-11 DIAGNOSIS — I2510 Atherosclerotic heart disease of native coronary artery without angina pectoris: Secondary | ICD-10-CM | POA: Insufficient documentation

## 2020-07-11 DIAGNOSIS — R0602 Shortness of breath: Secondary | ICD-10-CM | POA: Diagnosis present

## 2020-07-11 LAB — RESP PANEL BY RT-PCR (FLU A&B, COVID) ARPGX2
Influenza A by PCR: NEGATIVE
Influenza B by PCR: NEGATIVE
SARS Coronavirus 2 by RT PCR: NEGATIVE

## 2020-07-11 LAB — CBC WITH DIFFERENTIAL/PLATELET
Abs Immature Granulocytes: 0.01 10*3/uL (ref 0.00–0.07)
Basophils Absolute: 0 10*3/uL (ref 0.0–0.1)
Basophils Relative: 0 %
Eosinophils Absolute: 0.1 10*3/uL (ref 0.0–0.5)
Eosinophils Relative: 1 %
HCT: 42.9 % (ref 36.0–46.0)
Hemoglobin: 13.5 g/dL (ref 12.0–15.0)
Immature Granulocytes: 0 %
Lymphocytes Relative: 10 %
Lymphs Abs: 0.4 10*3/uL — ABNORMAL LOW (ref 0.7–4.0)
MCH: 31.5 pg (ref 26.0–34.0)
MCHC: 31.5 g/dL (ref 30.0–36.0)
MCV: 100 fL (ref 80.0–100.0)
Monocytes Absolute: 0.1 10*3/uL (ref 0.1–1.0)
Monocytes Relative: 2 %
Neutro Abs: 3.2 10*3/uL (ref 1.7–7.7)
Neutrophils Relative %: 87 %
Platelets: 76 10*3/uL — ABNORMAL LOW (ref 150–400)
RBC: 4.29 MIL/uL (ref 3.87–5.11)
RDW: 14.2 % (ref 11.5–15.5)
WBC: 3.7 10*3/uL — ABNORMAL LOW (ref 4.0–10.5)
nRBC: 0 % (ref 0.0–0.2)

## 2020-07-11 LAB — BASIC METABOLIC PANEL
Anion gap: 7 (ref 5–15)
BUN: 19 mg/dL (ref 8–23)
CO2: 30 mmol/L (ref 22–32)
Calcium: 7.9 mg/dL — ABNORMAL LOW (ref 8.9–10.3)
Chloride: 102 mmol/L (ref 98–111)
Creatinine, Ser: 0.49 mg/dL (ref 0.44–1.00)
GFR, Estimated: >60 mL/min (ref >60)
Glucose, Bld: 102 mg/dL — ABNORMAL HIGH (ref 70–99)
Potassium: 5.3 mmol/L — ABNORMAL HIGH (ref 3.5–5.1)
Sodium: 139 mmol/L (ref 135–145)

## 2020-07-11 MED ORDER — ALBUTEROL SULFATE (5 MG/ML) 0.5% IN NEBU
2.5000 mg | INHALATION_SOLUTION | RESPIRATORY_TRACT | 12 refills | Status: AC | PRN
Start: 2020-07-11 — End: 2020-07-26

## 2020-07-11 MED ORDER — PREDNISONE 10 MG PO TABS: ORAL_TABLET | ORAL | 0 refills | Status: AC

## 2020-07-11 MED ORDER — IPRATROPIUM BROMIDE 0.02 % IN SOLN
0.5000 mg | Freq: Once | RESPIRATORY_TRACT | Status: AC
  Administered 2020-07-11: 0.5 mg via RESPIRATORY_TRACT
  Filled 2020-07-11: qty 2.5

## 2020-07-11 MED ORDER — ALBUTEROL SULFATE (2.5 MG/3ML) 0.083% IN NEBU
5.0000 mg | INHALATION_SOLUTION | Freq: Once | RESPIRATORY_TRACT | Status: AC
  Administered 2020-07-11: 5 mg via RESPIRATORY_TRACT
  Filled 2020-07-11: qty 6

## 2020-07-11 MED ORDER — IPRATROPIUM BROMIDE 0.02 % IN SOLN
0.5000 mg | RESPIRATORY_TRACT | Status: AC
Start: 2020-07-11 — End: 2020-07-11
  Administered 2020-07-11 (×3): 0.5 mg via RESPIRATORY_TRACT
  Filled 2020-07-11 (×3): qty 2.5

## 2020-07-11 MED ORDER — ALBUTEROL SULFATE HFA 108 (90 BASE) MCG/ACT IN AERS: 2.0000 | INHALATION_SPRAY | RESPIRATORY_TRACT | Status: DC | PRN

## 2020-07-11 MED ORDER — ALBUTEROL SULFATE (2.5 MG/3ML) 0.083% IN NEBU
5.0000 mg | INHALATION_SOLUTION | RESPIRATORY_TRACT | Status: AC
  Administered 2020-07-11 (×3): 5 mg via RESPIRATORY_TRACT
  Filled 2020-07-11 (×3): qty 6

## 2020-07-11 NOTE — Discharge Instructions (Addendum)
Instructions:  For the next 2 days, you should give yourself albuterol nebulizer breathing treatments (with the machine, not the hand-pump) every 4 hours while awake.  This includes first thing in the morning when you wake up.  Then you should go back to using the nebulizer machine as needed.   Remember this machine will get more medicine into your lungs than the hand-pump inhaler alone.  You will also increase your prednisone steroid pills, beginning tomorrow morning.  I prescribe a new course of this medicine.  When you have finished the 13 day taper course, go back to taking your normal 5 mg daily dose.  Please follow up with your primary care doctor's office in 1-2 days.  If your breathing gets worse, or you begin feeling very dizzy, or have other new medical concerns, call 911 and come back to the hospital.

## 2020-07-11 NOTE — ED Triage Notes (Signed)
BIB [facility] after pt friend called to report pt having increase work of breathing. Upon EMS arrival, pt was found to be 83% on 2 L. Pt given Duo neb, 125 mg of solumedrol, and 2g of Mag en route to ER. Pt has hx of COPD.  Pt currently 100% on 2L, RR 17

## 2020-07-11 NOTE — ED Notes (Signed)
PT transported to XRAY 

## 2020-07-11 NOTE — ED Provider Notes (Signed)
[facility] EMERGENCY DEPARTMENT Provider Note   CSN: [id number] Arrival date & time: 07/11/20  1119     History Chief Complaint  Patient presents with  . Shortness of Breath    Wanda Prince is a 65 y.o. female.  Patient with history of significant COPD on 2 L home oxygen normally, currently on oral prednisone, history of alcohol and cocaine abuse, history of tracheostomy and respiratory arrest presents with worsening shortness of breath for the past 2 days.  A friend called due to increased work of breathing.  Patient was found to be 83% on 2 L nasal cannula and given DuoNeb and 125 mg of Solu-Medrol.  This feels overall similar to her COPD D exacerbations in the past.  Patient denies fevers chills or productive cough.  No chest pain.  Patient denies blood clot history.  No recent major surgeries or new leg edema.        Past Medical History:  Diagnosis Date  . 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 COPD (chronic obstructive pulmonary disease) ([place])   . H/O tracheostomy 04/22/2015   respiratory destress   . History of acute alcoholic hepatitis   . History of alcohol abuse   . History of cocaine abuse ([place])   . History of iron deficiency   . Hx of ventricular shunt 2009  . Hypertension   . Left breast mass   . Major depressive disorder    History of  . Meningitis 2009  . Primary cancer of right upper lobe of lung ([place]) 07/12/2015  . Psychiatric diagnosis    Multiple  . Respiratory arrest ([place])    secondary to Subependymal abscess  . Seizure disorder ([place])   . Seizures ([place])   . Suicide attempt [facility])    History of suicide attempts August 2006 and July 2006    Patient Active Problem List   Diagnosis Date Noted  . Healthcare maintenance 02/21/2020  . Chronic respiratory failure with hypoxia ([place]) 08/04/2018  .  Chronic respiratory failure with hypercapnia ([place]) 02/26/2018  . Low weight 09/02/2016  . Liver lesion 05/11/2016  . Hepatocellular carcinoma ([place]) 05/10/2016  . Preoperative cardiovascular examination 04/01/2016  . COPD  GOLD IV  05/18/2015  . Cavitating mass in right upper lung lobe   . COPD exacerbation ([place]) 04/22/2015  . Essential hypertension 01/10/2014  . Hep C w/o coma, chronic ([place]) 02/12/2012  . History of brain surgery 02/10/2012  . Hyperlipidemia 02/10/2012  . Seizure disorder ([place]) 01/25/2012  . Chronic diastolic CHF (congestive heart failure) ([place]) 01/25/2012  . Coronary artery disease, non-occlusive 06/07/2011    Past Surgical History:  Procedure Laterality Date  . BACK SURGERY    . BRAIN SURGERY    . BREAST EXCISIONAL BIOPSY Right 1982  . breast mass removal    . BREAST SURGERY    . CRANIOTOMY    . IR GENERIC HISTORICAL  02/28/2016   IR RADIOLOGIST EVAL & MGMT 02/28/2016 Aletta Edouard, MD [facility] INTER[facility] RAD  . IR GENERIC HISTORICAL  05/10/2016   IR VENIPUNCTURE 22YRS/OLDER BY MD [facility]  . IR GENERIC HISTORICAL  05/10/2016   IR US GUIDE VASC ACCESS RIGHT [facility]  . IR GENERIC HISTORICAL  06/26/2016   IR RADIOLOGIST EVAL & MGMT 06/26/2016 Aletta Edouard, MD [facility] INTERV RAD  . LEFT HEART CATHETERIZATION WITH CORONARY ANGIOGRAM N/A  06/06/2011   Procedure: LEFT HEART CATHETERIZATION WITH CORONARY ANGIOGRAM;  Surgeon: Burnell Blanks, MD;  [facility] CATH LAB: Moderate ~ 40% RCA disease noted. Otherwise normal  . RADIOLOGY WITH ANESTHESIA N/A 05/10/2016   Procedure: Microwave thermal ablation liver;  Surgeon: Aletta Edouard, MD;  Location: [facility] ORS;  Service: Radiology;  Laterality: N/A;  . TRANSTHORACIC ECHOCARDIOGRAM  05/2011   Insetting of seizure, positive troponin. EF 30-35% with anterior hypokinesis.  . TRANSTHORACIC ECHOCARDIOGRAM  12/2013   EF 60-65%. GR 1 DD. No significant valvular disease.     OB History    Gravida  2   Para  1   Term       Preterm      AB  1   Living        SAB      IAB      Ectopic      Multiple      Live Births              Family History  Problem Relation Age of Onset  . Coronary artery disease Father   . Breast cancer Maternal Aunt 57    Social History   Tobacco Use  . Smoking status: Former Smoker    Packs/day: 1.00    Years: 40.00    Pack years: 40.00    Types: Cigarettes    Quit date: 2020    Years since quitting: 2.2  . Smokeless tobacco: Never Used  Substance Use Topics  . Alcohol use: Yes    Alcohol/week: 8.0 standard drinks    Types: 8 Cans of beer per week    Comment: per week  . Drug use: Yes    Types: Cocaine    Comment: not in years    Home Medications Prior to Admission medications   Medication Sig Start Date End Date Taking? Authorizing Provider  acetaminophen (TYLENOL) 500 MG tablet Take 1,000 mg by mouth every 6 (six) hours as needed for pain.    [provider]  albuterol (PROVENTIL HFA;VENTOLIN HFA) 108 (90 BASE) MCG/ACT inhaler Inhale 2 puffs into the lungs every 4 (four) hours as needed for wheezing or shortness of breath.     [provider]  albuterol (PROVENTIL) (2.5 MG/3ML) 0.083% nebulizer solution USE 1 VIAL IN NEBULIZER EVERY 4 HOURS AS NEEDED FOR WHEEZING OR SHORTNESS OF BREATH Patient taking differently: Take 2.5 mg by nebulization every 4 (four) hours as needed for wheezing or shortness of breath. 12/16/18   Tanda Rockers, MD  aspirin EC 81 MG tablet Take 81 mg by mouth daily.    [provider]  b complex vitamins tablet Take 1 tablet by mouth 2 (two) times daily. 01/28/12   Eugenie Filler, MD  BEVESPI AEROSPHERE 9-4.8 MCG/ACT AERO INHALE 2 PUFFS INTO THE LUNGS 2 TIMES A DAY 06/01/20   Tanda Rockers, MD  Cholecalciferol 50 MCG (2000 UT) TABS Take 1 tablet by mouth daily. 02/15/20   [provider]  cyclobenzaprine (FLEXERIL) 5 MG tablet Take 5 mg by mouth 2 (two) times daily as needed for muscle  spasms.  02/03/20   [provider]  feeding supplement, ENSURE ENLIVE, (ENSURE ENLIVE) LIQD Take 237 mLs by mouth 2 (two) times daily between meals. Patient taking differently: Take 237 mLs by mouth 3 (three) times daily between meals. 07/29/18   Florencia Reasons, MD  folic acid (FOLVITE) 1 MG tablet Take 1 mg by mouth daily.    [provider]  gabapentin (NEURONTIN) 300 MG capsule Take 600 mg by mouth at bedtime.     [provider]  levETIRAcetam (KEPPRA) 500 MG tablet Take 1 tablet (500 mg total) by mouth 2 (two) times daily. 01/28/12   Eugenie Filler, MD  metoprolol succinate (TOPROL-XL) 25 MG 24 hr tablet Take 25 mg by mouth daily. 09/30/13   [provider]  Multiple Vitamin (MULITIVITAMIN WITH MINERALS) TABS Take 1 tablet by mouth daily. 06/07/11   Dorian Heckle, MD  naltrexone (DEPADE) 50 MG tablet Take 50 mg by mouth daily. 06/17/18   [provider]  pantoprazole (PROTONIX) 40 MG tablet Take 40 mg by mouth daily.  02/15/20   [provider]  predniSONE (DELTASONE) 5 MG tablet Take 1 tablet (5 mg total) by mouth daily with breakfast. 04/07/20   Tanda Rockers, MD  spironolactone (ALDACTONE) 25 MG tablet Take 25 mg by mouth daily.    [provider]    Allergies    Patient has no known allergies.  Review of Systems   Review of Systems  Constitutional: Negative for chills and fever.  HENT: Negative for congestion.   Eyes: Negative for visual disturbance.  Respiratory: Positive for shortness of breath and wheezing.   Cardiovascular: Negative for chest pain and leg swelling.  Gastrointestinal: Negative for abdominal pain and vomiting.  Genitourinary: Negative for dysuria and flank pain.  Musculoskeletal: Negative for back pain, neck pain and neck stiffness.  Skin: Negative for rash.  Neurological: Negative for light-headedness and headaches.    Physical Exam Updated Vital Signs BP 140/81   Pulse 95   Temp 98 F (36.7 C)  (Oral)   Resp (!) 22   LMP  (LMP Unknown)   SpO2 99%   Physical Exam Vitals and nursing note reviewed.  Constitutional:      Appearance: She is well-developed.  HENT:     Head: Normocephalic and atraumatic.  Eyes:     General:        Right eye: No discharge.        Left eye: No discharge.     Conjunctiva/sclera: Conjunctivae normal.  Neck:     Trachea: No tracheal deviation.  Cardiovascular:     Rate and Rhythm: Normal rate and regular rhythm.  Pulmonary:     Effort: Tachypnea present.     Breath sounds: Examination of the right-upper field reveals decreased breath sounds. Examination of the left-upper field reveals decreased breath sounds. Examination of the right-middle field reveals decreased breath sounds. Examination of the left-middle field reveals decreased breath sounds. Examination of the left-lower field reveals decreased breath sounds. Decreased breath sounds and wheezing present.  Abdominal:     General: There is no distension.     Palpations: Abdomen is soft.     Tenderness: There is no abdominal tenderness. There is no guarding.  Musculoskeletal:     Cervical back: Normal range of motion and neck supple.  Skin:    General: Skin is warm.     Findings: No rash.  Neurological:     Mental Status: She is alert and oriented to person, place, and time.     ED Results / Procedures / Treatments   Labs (all labs ordered are listed, but only abnormal results are displayed) Labs Reviewed  BASIC METABOLIC PANEL - Abnormal; Notable for the following components:      Result Value   Potassium 5.3 (*)    Glucose, Bld 102 (*)    Calcium 7.9 (*)  All other components within normal limits  RESP PANEL BY RT-PCR (FLU A&B, COVID) ARPGX2  CBC WITH DIFFERENTIAL/PLATELET  CBC    EKG EKG Interpretation  Date/Time:  Tuesday July 11 2020 11:27:47 EDT Ventricular Rate:  79 PR Interval:    QRS Duration: 89 QT Interval:  373 QTC Calculation: 428 R Axis:   87 Text  Interpretation: Sinus rhythm Borderline right axis deviation similar to previous Confirmed by Elnora Morrison [phone]) on 07/11/2020 12:09:26 PM   Radiology DG Chest 2 View  Result Date: 07/11/2020 CLINICAL DATA:  Shortness of breath, COPD.  History of lung cancer EXAM: CHEST - 2 VIEW COMPARISON:  03/03/2020 FINDINGS: There is hyperinflation of the lungs compatible with COPD. Heart and mediastinal contours are within normal limits. No focal opacities or effusions. No acute bony abnormality. Ventriculoperitoneal shunt catheter again noted in the right anterior chest wall. IMPRESSION: Emphysema.  No active disease. Electronically Signed   By: Rolm Baptise M.D.   On: 07/11/2020 13:58    Procedures Procedures   Medications Ordered in ED Medications  albuterol (VENTOLIN HFA) 108 (90 Base) MCG/ACT inhaler 2 puff (has no administration in time range)  albuterol (PROVENTIL) (2.5 MG/3ML) 0.083% nebulizer solution 5 mg (5 mg Nebulization Given 07/11/20 1423)  ipratropium (ATROVENT) nebulizer solution 0.5 mg (0.5 mg Nebulization Given 07/11/20 1423)  albuterol (PROVENTIL) (2.5 MG/3ML) 0.083% nebulizer solution 5 mg (5 mg Nebulization Given 07/11/20 1543)  ipratropium (ATROVENT) nebulizer solution 0.5 mg (0.5 mg Nebulization Given 07/11/20 1547)    ED Course  I have reviewed the triage vital signs and the nursing notes.  Pertinent labs & imaging results that were available during my care of the patient were reviewed by me and considered in my medical decision making (see chart for details).    MDM Rules/Calculators/A&P                          Patient with significant COPD and admission for this in the past history presents with clinical concern for acute COPD exacerbation.  No specific trigger that patient recalls.  No sign of significant infection on exam.  Patient has poor air movement bilateral, ordered and discussed 3 repeat albuterol and ipratropium nebulizers, patient already received steroids.   Chest x-ray, blood work pending and Covid test ordered.  Chest x-ray reviewed no acute abnormalities.  Blood work reviewed showing calcium 7.9, normal white blood cell count, normal hemoglobin.  Patient improved with multiple nebs.  Plan for oral fluids, ambulation and reassessment on signout by afternoon physician. Patient stable on 2 L nasal cannula after treatments.  BRECKIN ZAFAR was evaluated in Emergency Department on 07/11/2020 for the symptoms described in the history of present illness. She was evaluated in the context of the global COVID-19 pandemic, which necessitated consideration that the patient might be at risk for infection with the SARS-CoV-2 virus that causes COVID-19. Institutional protocols and algorithms that pertain to the evaluation of patients at risk for COVID-19 are in a state of rapid change based on information released by regulatory bodies including the CDC and federal and state organizations. These policies and algorithms were followed during the patient's care in the ED.  Final Clinical Impression(s) / ED Diagnoses Final diagnoses:  Acute exacerbation of chronic obstructive pulmonary disease (COPD) ([place])  Hypoxia    Rx / DC Orders ED Discharge Orders    None       Elnora Morrison, MD 07/11/20 1603

## 2020-07-11 NOTE — ED Notes (Signed)
Patient transported to CT 

## 2020-07-11 NOTE — ED Provider Notes (Signed)
Patient reassessed after signout from Dr Reather Converse.  She feels significantly better.  Ambulated without difficulty.  Her oxygen level is stable and normal on her baseline 2L [place].  She prefers to go home.  We discussed albuterol nebs Q4 for next 2 days, increased prednisone dosing to 60 mg on a taper, and PCP f/u.  She verbalized understanding.  Her daughter is coming to pick her up.   Wyvonnia Dusky, MD 07/11/20 [phone]

## 2020-07-11 NOTE — ED Notes (Signed)
Patient ambulated alongside the bed on 2L of O2. Patient's oxygen level dropped from 96% to 91% while ambulating. Patient was not able to tolerate ambulating and stated she was dizzy and had to sit down to take a break. Patient was able to catch her breath and return to bed, oxygen remained above 90 the entire time.

## 2020-07-26 ENCOUNTER — Telehealth: Payer: Self-pay

## 2020-07-26 ENCOUNTER — Other Ambulatory Visit: Payer: Self-pay

## 2020-07-26 ENCOUNTER — Other Ambulatory Visit: Payer: Medicaid Other | Admitting: Nurse Practitioner

## 2020-07-26 DIAGNOSIS — E43 Unspecified severe protein-calorie malnutrition: Secondary | ICD-10-CM

## 2020-07-26 DIAGNOSIS — Z515 Encounter for palliative care: Secondary | ICD-10-CM

## 2020-07-26 NOTE — Progress Notes (Addendum)
[facility] Consult Note Telephone: [phone]  Fax: [phone]  PATIENT NAME: Wanda Prince [address] [place] [address] [phone] (home)  DOB: [birth year] MRN: [id number]  PRIMARY CARE PROVIDER:    Katherina Mires, MD,  [place] [address] [phone]  REFERRING PROVIDER:   Katherina Mires, [place],  [place] [address] [phone]  RESPONSIBLE PARTY:   Extended Emergency Contact Information Primary Emergency Contact: Susanne Borders, [place] [address] [place] Mobile Phone: [phone] Relation: Sister  I met face to face with patient in home.  ASSESSMENT AND RECOMMENDATIONS:   Advance Care Planning: Patein's sister Vickii Chafe present at visit, her care giver Seth Bake also present at visit. Goal of care: Patient's goal of care is comfort.  Directives: Signed DNR and MOST form present in home, copy on [facility] EMR.Details of MOST include; limited additional intervention, determine use and limitation of antibiotics, IVF for defined trial period, Feeding tube for defined trial period.  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 She is on 2L oxygen via nasal cannula with saturation mostly in the 80s, was 88% during visit today. She is followed by Pulmonology Dr. Melvyn Novas. Family unable to up titrate oxygen due to patient living in an old house with poor electric wiring. Not on Benzo or Morphine to relieve anxiety related to hypoxia due to hx of drug use and hx of inappropriate drug administration. Patient has hx of frequent ED visit for respiratory distress. Lat ED visit was 2 weeks ago. Recommendation: Continue current plan of care with Prednisone, Bevespi inhaler twice a day and Albuterol neb and MDI as needed. Continue follow up  and recommendation by pulmonology. Weight loss: Patient with ongoing weight loss, has 8lbs weight loss in the last 3 months, BMI down to 14.4. Patient on Nutritional supplement Ensure twice a day, report difficulty with oral intake due to difficulty breathing. Patient appeared very deconditioned. Home hospice services and philosophy were discussed and explained in detail to patient and her sister Vickii Chafe, they expressed interest in home Hospice care.   Family /Caregiver/Community Supports: Patient lives at home alone. Her sister is her main caregiver and HCPOA. Patient's daughter not involved in her care.  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 Requires assistance with her ADLs. She now has care giving services from [facility] 3 hrs a day.  I spent 55 minutes providing this consultation, time includes time spent with patient and family, chart review, provider coordination, and documentation. More than 50% of the time in this consultation was spent counseling and coordinating communication.   CHIEF COMPLAINT: Poor appetite  History obtained from review of EMR and discussion with patient and family. Records reviewed and summarized bellow.  HISTORY OF PRESENT ILLNESS:  Wanda Prince is a 65 y.o. year old female with multiple medical problems including end stage COPD, hx of ETOH abuse, hx of drug use, hx of hepatocellular cancer s/p ablation, remote history of breast cancer, quit smoking in 2020. Palliative Care was asked to follow this patient by consultation request of Katherina Mires, MD to help address advance care planning and  goals of care.  CODE STATUS: DNR  PPS: 40%  HOSPICE ELIGIBILITY/DIAGNOSIS: yes/ severe protein calorie malnutrition  ROS/staff/patient Constitutional: denies fever, denies chills EYES: denies acute vision changes ENMT:  denies dysphagia Cardiovascular: denies chest pain, denies palpitation Pulmonary: denies acute cough, endorsed increased SOB Abdomen: endorses poor appetite, denies constipation, denies incontinence of bowel GU: denies dysuria, denies incontinence of urine MSK:  endorses ROM limitations Skin: denies rashes or wounds Neurological: endorses weakness, denies pain, denies insomnia Psych: Endorses positive mood, forgetful at times Heme/lymph/immuno: denies bruises, abnormal bleeding   Physical Exam: Current and past weights: 92lbs down from 100lbs at last visit  3 months ago. Ht 80f, 7, BMI 14.4kg/m2 General: chronically ill and frail appearing, cachetic EYES: anicteric sclera, lids intact, no discharge  ENMT: intact hearing, oral mucous membranes moist CV:  no LE edema Pulmonary: appeared dyspnic, diminished breath sounds, no cough, no audible wheezes, on supplemental oxygen at 2L, sats 88% Abdomen: intake  <25%, no ascites GU: deferred MSK: severe sarcopenia, move extremeities, no contractures , ambulatory Skin: warm and dry, no rashes or wounds on visible skin Neuro: Generalized weakness, A and O x 2 Psych: anxious affect today Hem/lymph/immuno: no widespread bruising   PAST MEDICAL HISTORY:  Past Medical History:  Diagnosis Date  . 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 COPD (chronic obstructive pulmonary disease) ([place]   . H/O tracheostomy 04/22/2015   respiratory destress   . History of acute alcoholic hepatitis   . History of alcohol abuse   . History of cocaine abuse ([facility]   . History of iron deficiency   . Hx of ventricular shunt 2009  . Hypertension   . Left breast mass   . Major depressive disorder    History of  . Meningitis 2009  . Primary cancer of right upper lobe of lung ([facility] 07/12/2015  . Psychiatric diagnosis    Multiple  .  Respiratory arrest ([facility]    secondary to Subependymal abscess  . Seizure disorder ([facility]   . Seizures ([facility]   . Suicide attempt ([facility]    History of suicide attempts August 2006 and July 2006    SOCIAL HX:  Social History   Tobacco Use  . Smoking status: Former Smoker    Packs/day: 1.00    Years: 40.00    Pack years: 40.00    Types: Cigarettes    Quit date: 2020    Years since quitting: 2.2  . Smokeless tobacco: Never Used  Substance Use Topics  . Alcohol use: Yes    Alcohol/week: 8.0 standard drinks    Types: 8 Cans of beer per week    Comment: per week   FAMILY HX:  Family History  Problem Relation Age of Onset  . Coronary artery disease Father   . Breast cancer Maternal Aunt 60    ALLERGIES: No Known Allergies   PERTINENT MEDICATIONS:  Outpatient Encounter Medications as of 07/26/2020  Medication Sig  . acetaminophen (TYLENOL) 500 MG tablet Take 1,000 mg by mouth every 6 (six) hours as needed for pain.  .Marland Kitchenalbuterol (PROVENTIL HFA;VENTOLIN HFA) 108 (90 BASE) MCG/ACT inhaler Inhale 2 puffs into the lungs every 4 (four) hours as needed for wheezing or shortness of breath.   .Marland Kitchenalbuterol (PROVENTIL) (2.5 MG/3ML) 0.083% nebulizer solution USE 1 VIAL IN NEBULIZER EVERY 4 HOURS AS NEEDED FOR WHEEZING OR SHORTNESS  OF BREATH (Patient taking differently: Take 2.5 mg by nebulization every 4 (four) hours as needed for wheezing or shortness of breath.)  . albuterol (PROVENTIL) (5 MG/ML) 0.5% nebulizer solution Take 0.5 mLs (2.5 mg total) by nebulization every 4 (four) hours as needed for up to 15 days for wheezing or shortness of breath.  Marland Kitchen aspirin EC 81 MG tablet Take 81 mg by mouth daily.  Marland Kitchen b complex vitamins tablet Take 1 tablet by mouth 2 (two) times daily.  Marland Kitchen BEVESPI AEROSPHERE 9-4.8 MCG/ACT AERO INHALE 2 PUFFS INTO THE LUNGS 2 TIMES A DAY  . Cholecalciferol 50 MCG (2000 UT) TABS Take 1 tablet by mouth daily.  . cyclobenzaprine (FLEXERIL) 5 MG tablet Take 5 mg by mouth 2 (two)  times daily as needed for muscle spasms.   . feeding supplement, ENSURE ENLIVE, (ENSURE ENLIVE) LIQD Take 237 mLs by mouth 2 (two) times daily between meals. (Patient taking differently: Take 237 mLs by mouth 3 (three) times daily between meals.)  . folic acid (FOLVITE) 1 MG tablet Take 1 mg by mouth daily.  Marland Kitchen gabapentin (NEURONTIN) 300 MG capsule Take 600 mg by mouth at bedtime.   . levETIRAcetam (KEPPRA) 500 MG tablet Take 1 tablet (500 mg total) by mouth 2 (two) times daily.  . metoprolol succinate (TOPROL-XL) 25 MG 24 hr tablet Take 25 mg by mouth daily.  . Multiple Vitamin (MULITIVITAMIN WITH MINERALS) TABS Take 1 tablet by mouth daily.  . naltrexone (DEPADE) 50 MG tablet Take 50 mg by mouth daily.  . pantoprazole (PROTONIX) 40 MG tablet Take 40 mg by mouth daily.   . predniSONE (DELTASONE) 5 MG tablet Take 1 tablet (5 mg total) by mouth daily with breakfast.  . spironolactone (ALDACTONE) 25 MG tablet Take 25 mg by mouth daily.   No facility-administered encounter medications on file as of 07/26/2020.    Thank you for the opportunity to participate in the care of Ms. Sharon Seller. The palliative care team will continue to follow. Please call our office at [phone] if we can be of additional assistance.  Jari Favre DNP, AGPCNP-BC

## 2020-07-26 NOTE — Telephone Encounter (Signed)
(  3:48p) SW completed mobile meals referral for patient per request of NP-Queeneth. The representative advised SW that patient was already on the list. Currently there is a six month waiting list for services and she will be contacted when her name comes up.   *NP updated for follow-up

## 2020-08-13 ENCOUNTER — Other Ambulatory Visit: Payer: Self-pay | Admitting: Internal Medicine

## 2020-08-17 ENCOUNTER — Ambulatory Visit: Payer: Medicaid Other

## 2020-08-27 DEATH — deceased

## 2020-10-06 ENCOUNTER — Ambulatory Visit: Payer: Medicaid Other | Admitting: Internal Medicine

## 2020-11-13 ENCOUNTER — Other Ambulatory Visit: Payer: Self-pay | Admitting: Neurosurgery

## 2020-11-13 DIAGNOSIS — I607 Nontraumatic subarachnoid hemorrhage from unspecified intracranial artery: Secondary | ICD-10-CM

## 2020-11-27 IMAGING — DX DG CHEST 1V PORT
1 series · 2 of 2 positions shown · non-contrast
Comparison: CT chest 11/01/2018

CLINICAL DATA: Evaluate shortness of breath.

EXAM:
PORTABLE CHEST 1 VIEW

[Series 1: chest · 0.14mm/px · 2 of 2 slices shown]
[im 1/2]
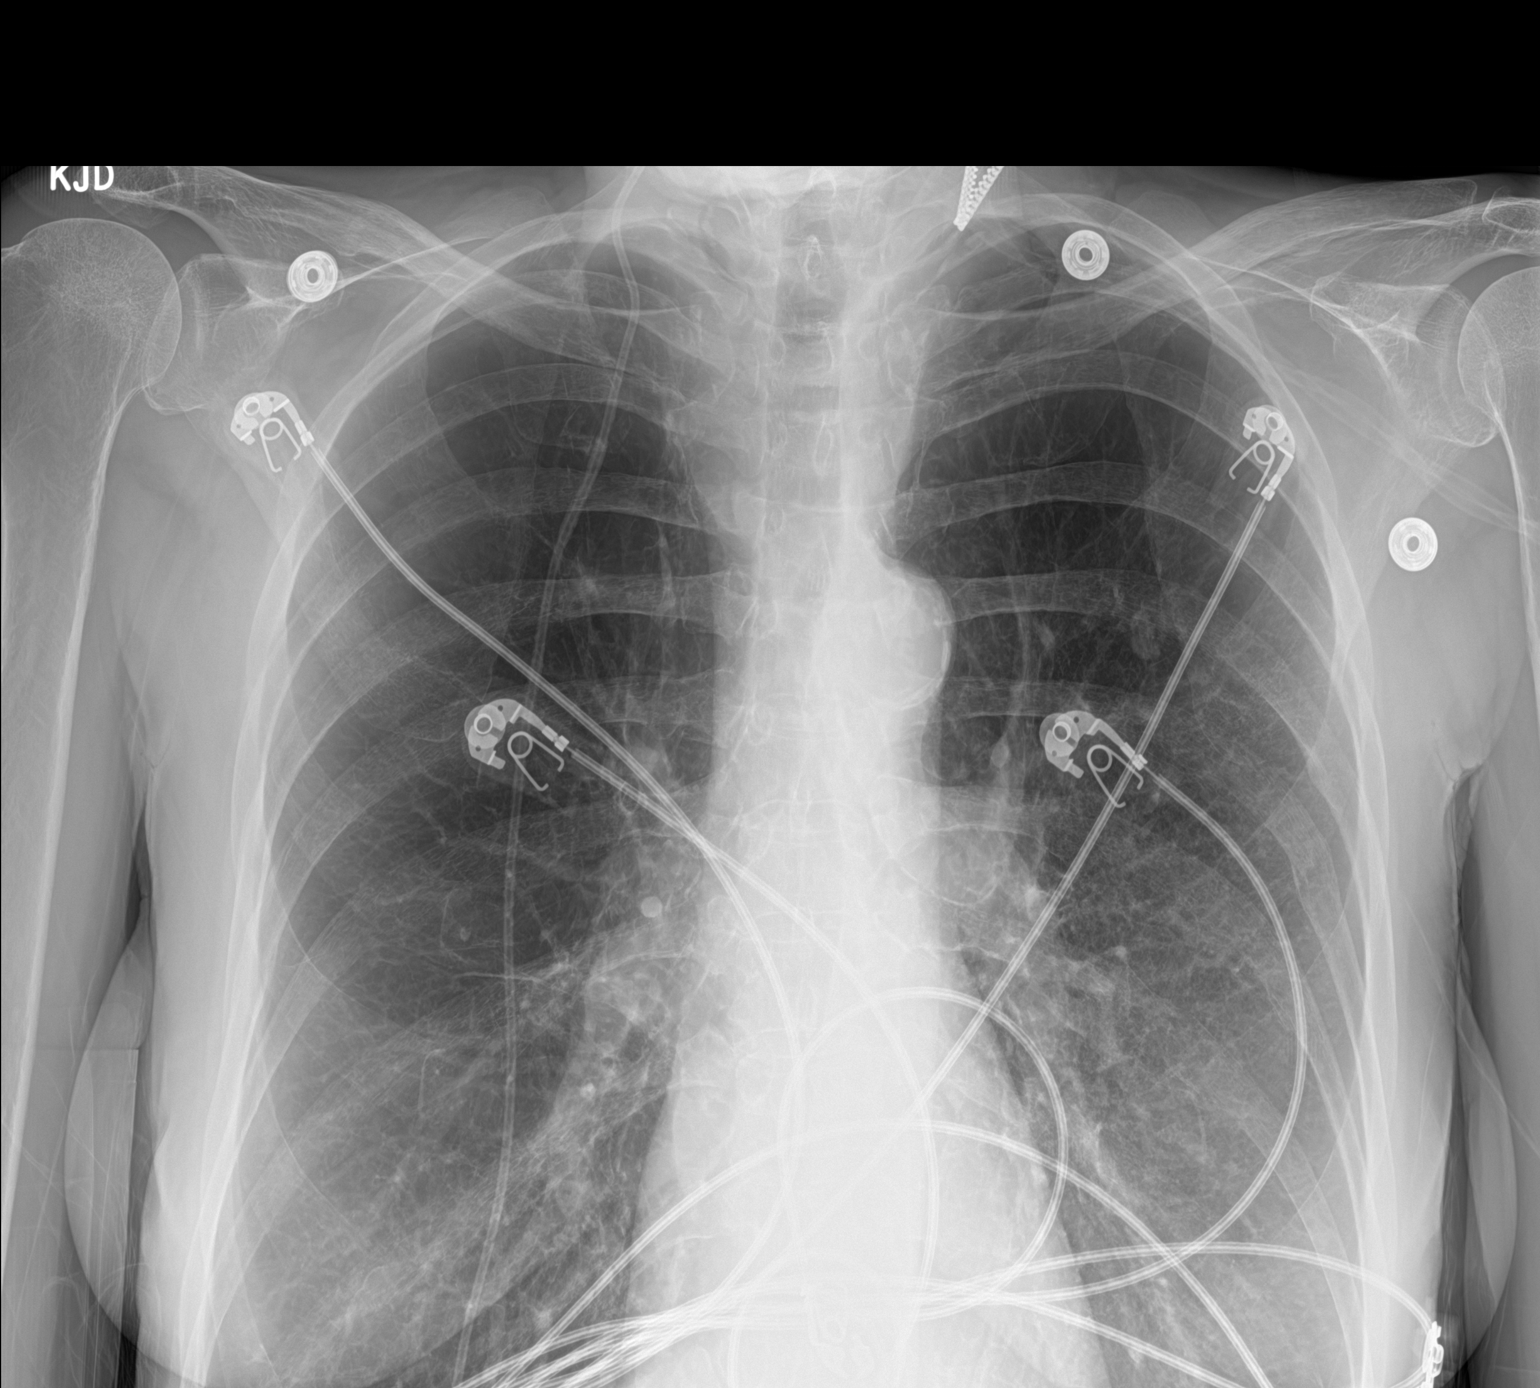
[im 2/2]
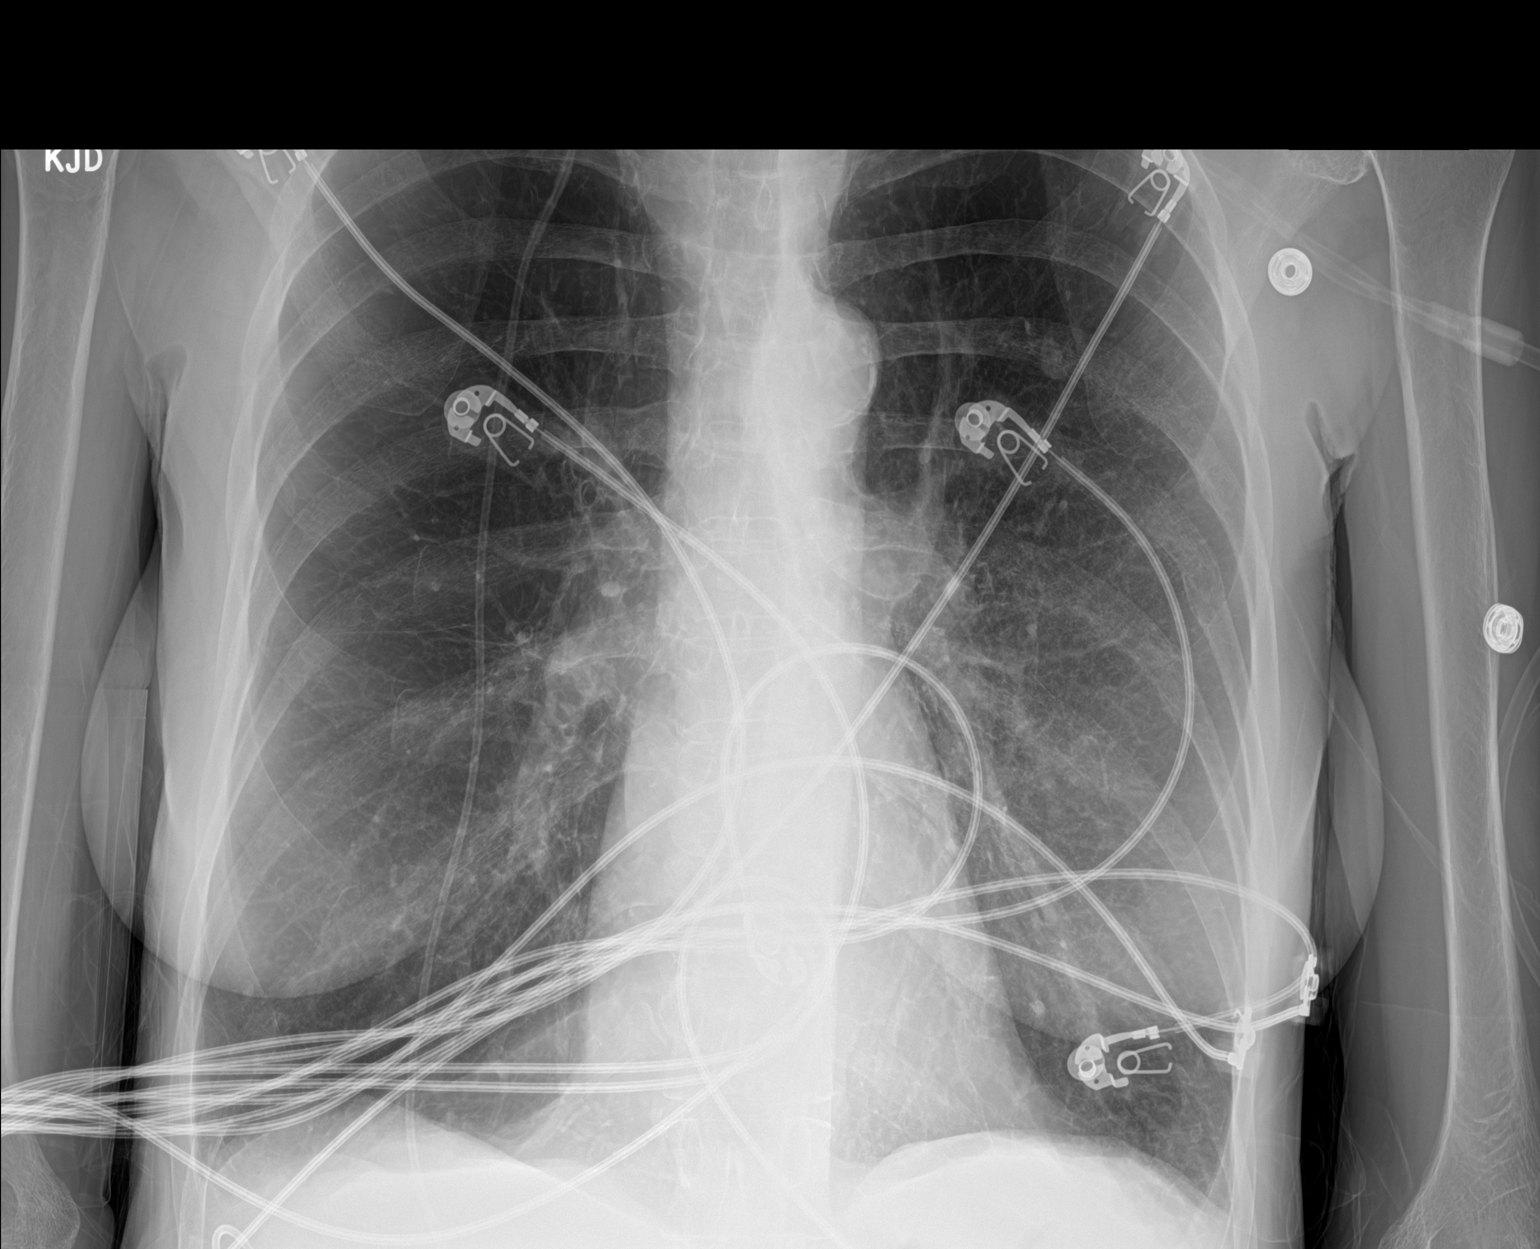

[2 of 2 positions shown; findings below may reference images not displayed]

FINDINGS: The heart size appears normal. Aortic atherosclerotic calcifications
noted. No pleural effusion or interstitial edema. The lungs are
hyperinflated and there are diffuse interstitial changes compatible
with emphysema. No superimposed airspace consolidation. Previous
left upper lobe lung nodule is unchanged measuring 1 cm. The
cavitary lung nodule within the right upper lobe is not well
visualized on today's exam.
IMPRESSION: 1. Emphysema.
2. No acute cardiopulmonary abnormalities.

## 2021-04-06 IMAGING — CR DG CHEST 2V
2 series · 2 of 2 positions shown · non-contrast
Comparison: 03/03/2020

CLINICAL DATA: Shortness of breath, COPD.  History of lung cancer

EXAM:
CHEST - 2 VIEW

[chest ap]
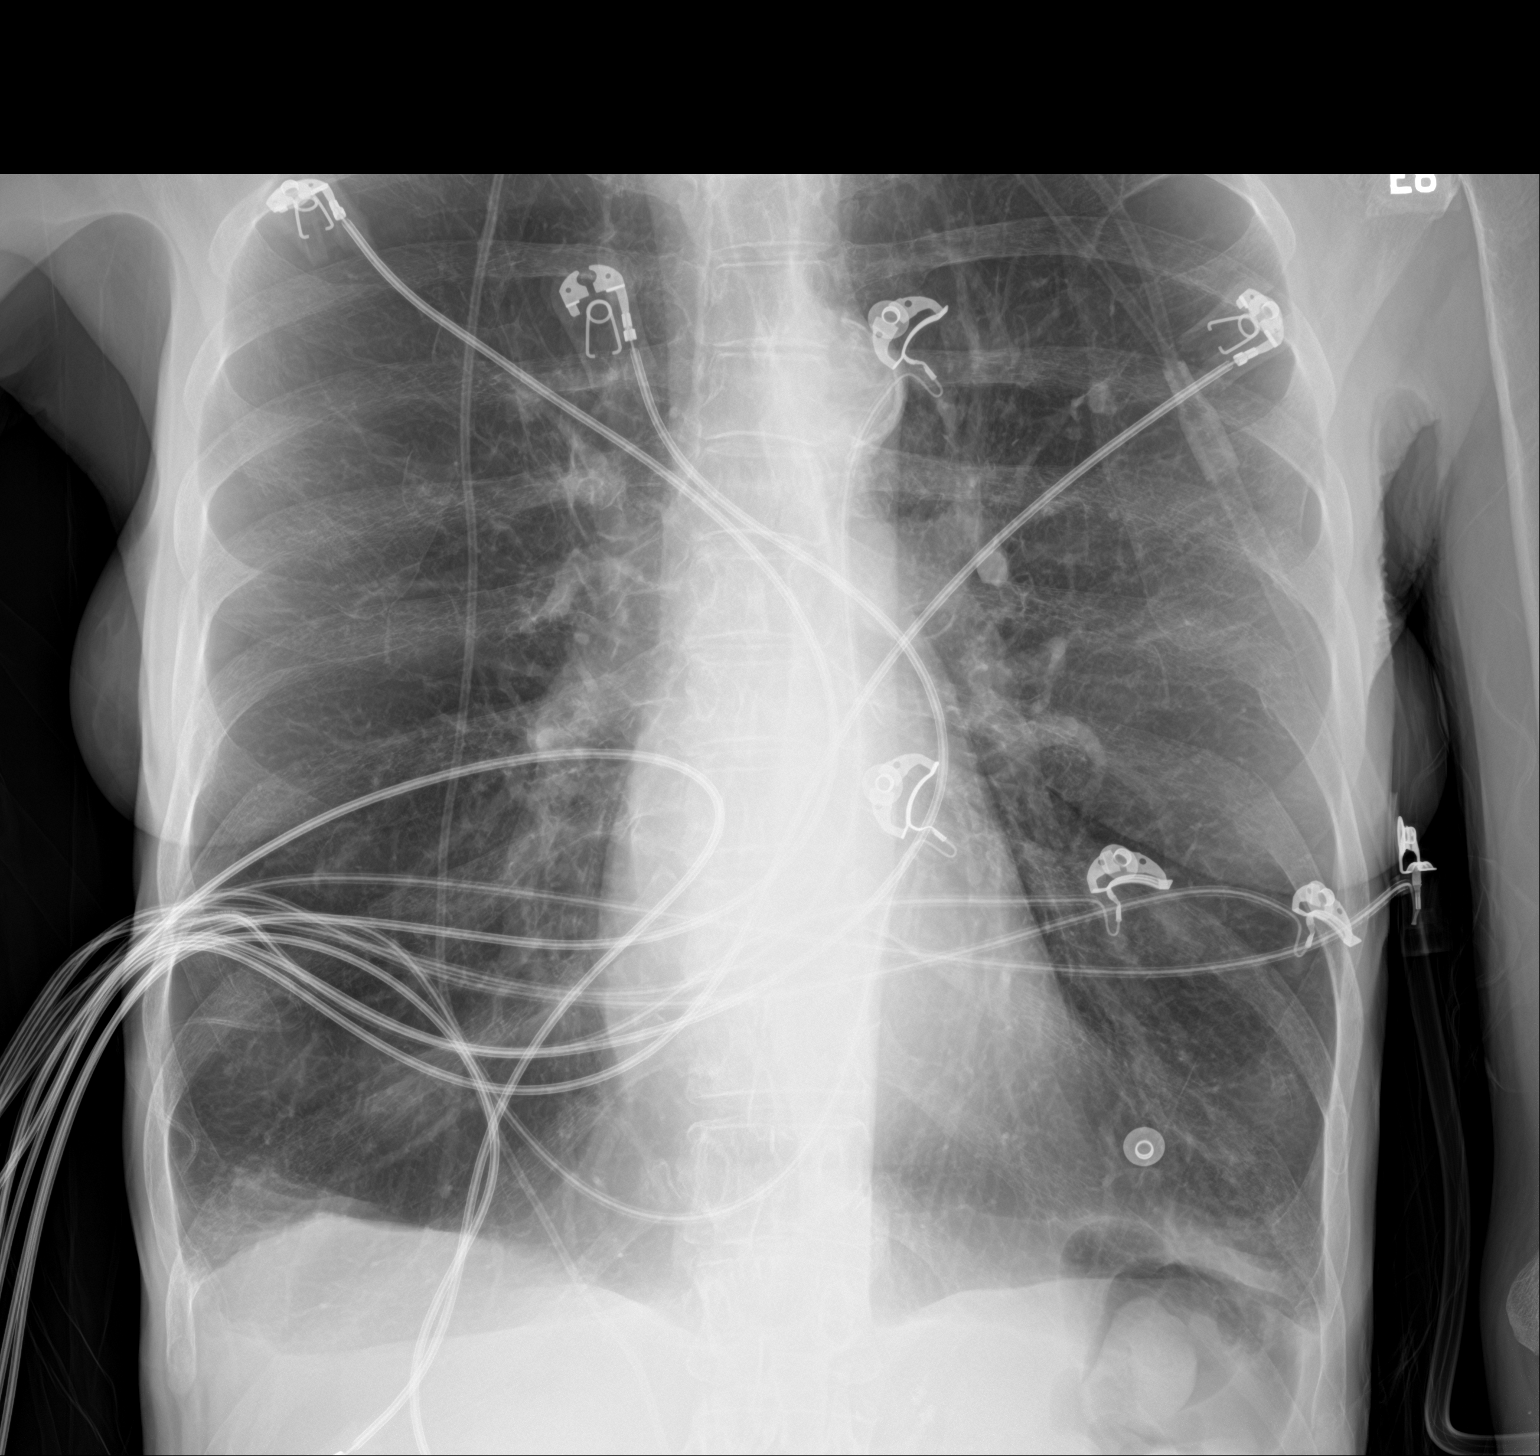

[chest lat]
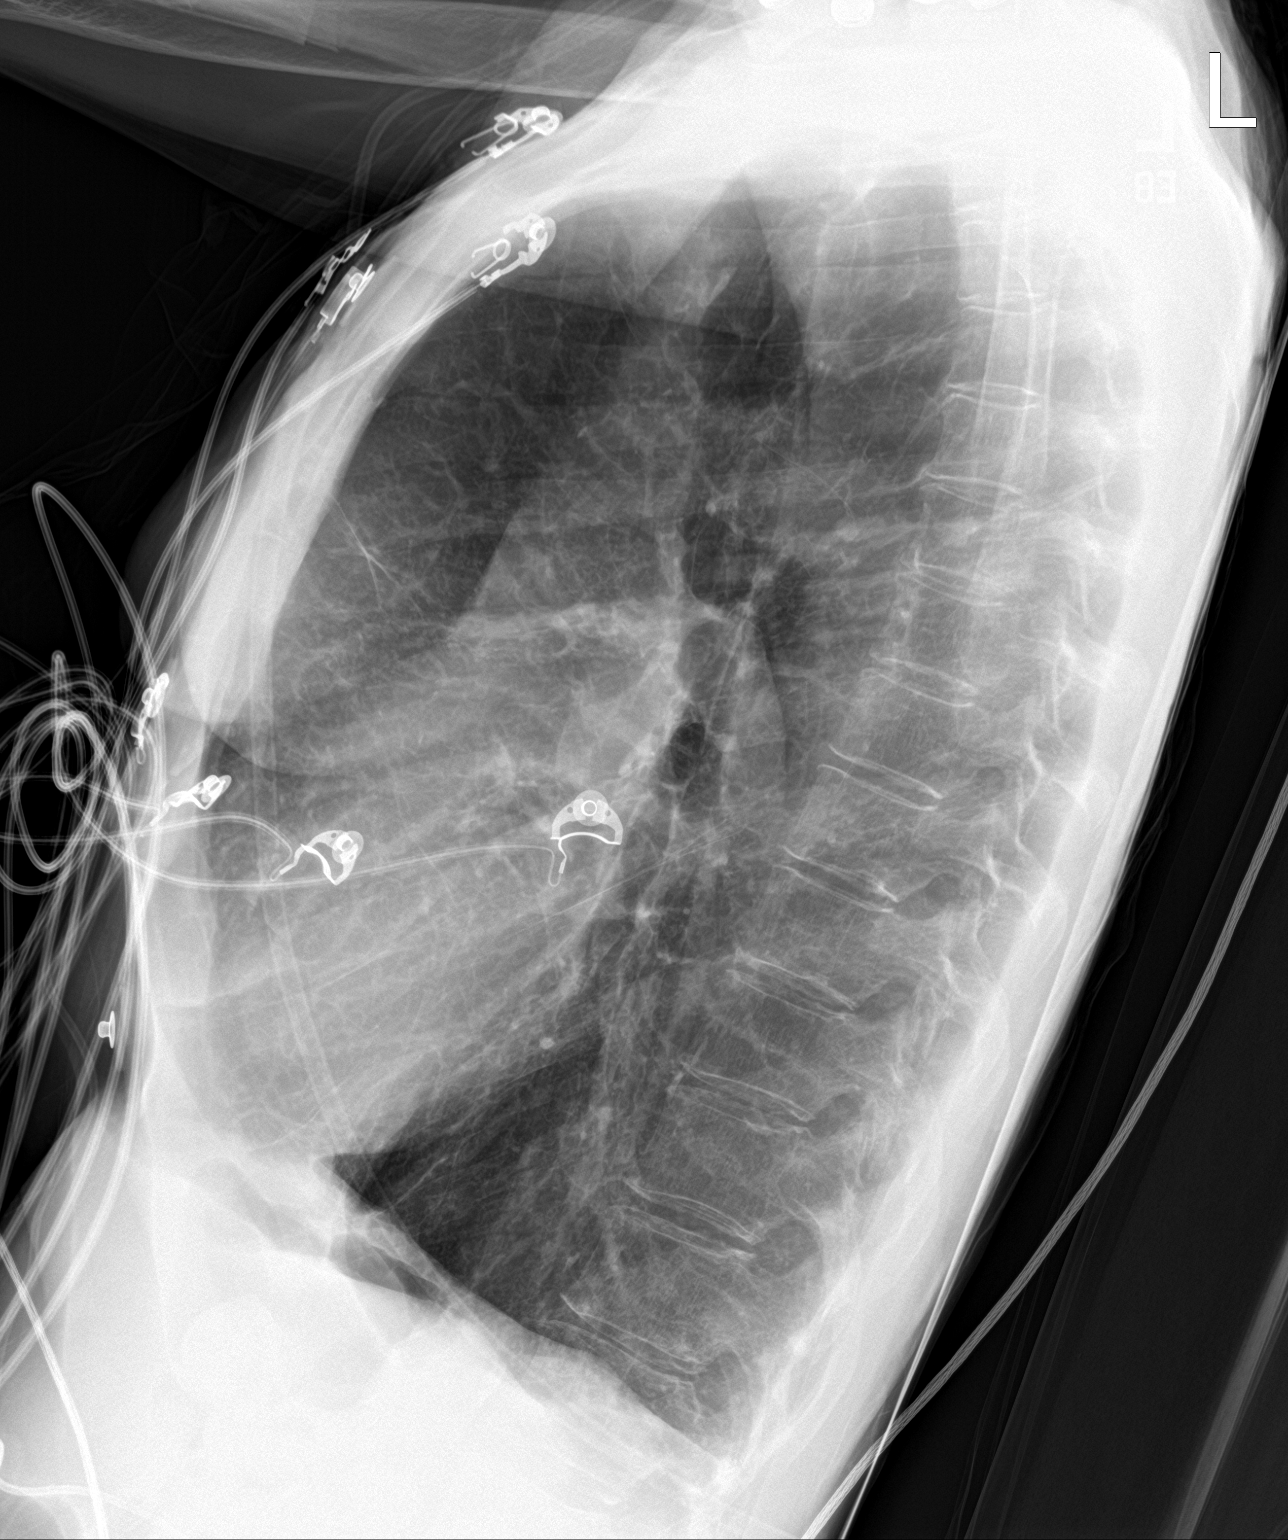

[2 of 2 positions shown; findings below may reference images not displayed]

FINDINGS: There is hyperinflation of the lungs compatible with COPD. Heart and
mediastinal contours are within normal limits. No focal opacities or
effusions. No acute bony abnormality. Ventriculoperitoneal shunt
catheter again noted in the right anterior chest wall.
IMPRESSION: Emphysema.  No active disease.
